# Patient Record
Sex: Male | Born: 1965 | Race: White | Hispanic: No | Smoking: Current every day smoker
Health system: Southern US, Community
[De-identification: ages and names within clinical notes are randomized; demographics above are authoritative.]

## PROBLEM LIST (undated history)

## (undated) DIAGNOSIS — B1920 Unspecified viral hepatitis C without hepatic coma: Secondary | ICD-10-CM

## (undated) DIAGNOSIS — F32A Depression, unspecified: Secondary | ICD-10-CM

## (undated) DIAGNOSIS — M549 Dorsalgia, unspecified: Secondary | ICD-10-CM

## (undated) DIAGNOSIS — F329 Major depressive disorder, single episode, unspecified: Secondary | ICD-10-CM

## (undated) DIAGNOSIS — G43909 Migraine, unspecified, not intractable, without status migrainosus: Secondary | ICD-10-CM

## (undated) DIAGNOSIS — C801 Malignant (primary) neoplasm, unspecified: Secondary | ICD-10-CM

## (undated) DIAGNOSIS — F419 Anxiety disorder, unspecified: Secondary | ICD-10-CM

## (undated) DIAGNOSIS — I639 Cerebral infarction, unspecified: Secondary | ICD-10-CM

## (undated) DIAGNOSIS — F1010 Alcohol abuse, uncomplicated: Secondary | ICD-10-CM

## (undated) DIAGNOSIS — I10 Essential (primary) hypertension: Secondary | ICD-10-CM

## (undated) DIAGNOSIS — K7460 Unspecified cirrhosis of liver: Secondary | ICD-10-CM

## (undated) HISTORY — PX: MANDIBLE SURGERY: SHX707

## (undated) HISTORY — PX: FOOT SURGERY: SHX648

---

## 2001-03-09 ENCOUNTER — Encounter: Payer: Self-pay | Admitting: Internal Medicine

## 2001-03-09 ENCOUNTER — Ambulatory Visit (HOSPITAL_COMMUNITY): Admission: RE | Admit: 2001-03-09 | Discharge: 2001-03-09 | Payer: Self-pay | Admitting: Internal Medicine

## 2001-05-12 ENCOUNTER — Emergency Department (HOSPITAL_COMMUNITY): Admission: EM | Admit: 2001-05-12 | Discharge: 2001-05-12 | Payer: Self-pay | Admitting: Emergency Medicine

## 2005-03-11 ENCOUNTER — Observation Stay (HOSPITAL_COMMUNITY): Admission: EM | Admit: 2005-03-11 | Discharge: 2005-03-12 | Payer: Self-pay | Admitting: Emergency Medicine

## 2005-04-02 ENCOUNTER — Ambulatory Visit (HOSPITAL_COMMUNITY): Admission: RE | Admit: 2005-04-02 | Discharge: 2005-04-02 | Payer: Self-pay | Admitting: *Deleted

## 2005-07-29 ENCOUNTER — Emergency Department (HOSPITAL_COMMUNITY): Admission: EM | Admit: 2005-07-29 | Discharge: 2005-07-29 | Payer: Self-pay | Admitting: Emergency Medicine

## 2005-08-12 ENCOUNTER — Emergency Department (HOSPITAL_COMMUNITY): Admission: EM | Admit: 2005-08-12 | Discharge: 2005-08-12 | Payer: Self-pay | Admitting: Emergency Medicine

## 2005-10-24 ENCOUNTER — Ambulatory Visit: Payer: Self-pay | Admitting: Cardiology

## 2005-10-24 ENCOUNTER — Encounter (INDEPENDENT_AMBULATORY_CARE_PROVIDER_SITE_OTHER): Payer: Self-pay | Admitting: Internal Medicine

## 2005-10-24 ENCOUNTER — Inpatient Hospital Stay (HOSPITAL_COMMUNITY): Admission: EM | Admit: 2005-10-24 | Discharge: 2005-10-26 | Payer: Self-pay | Admitting: Emergency Medicine

## 2005-10-24 LAB — CONVERTED CEMR LAB: TSH: 0.522 microintl units/mL

## 2005-10-26 ENCOUNTER — Encounter (INDEPENDENT_AMBULATORY_CARE_PROVIDER_SITE_OTHER): Payer: Self-pay | Admitting: Internal Medicine

## 2005-10-26 LAB — CONVERTED CEMR LAB
Cholesterol: 187 mg/dL
HDL: 40 mg/dL
LDL Cholesterol: 102 mg/dL
Triglycerides: 225 mg/dL

## 2005-10-30 ENCOUNTER — Ambulatory Visit: Payer: Self-pay | Admitting: Internal Medicine

## 2005-12-06 ENCOUNTER — Ambulatory Visit: Payer: Self-pay | Admitting: Internal Medicine

## 2005-12-30 ENCOUNTER — Ambulatory Visit: Payer: Self-pay | Admitting: Internal Medicine

## 2006-03-11 ENCOUNTER — Ambulatory Visit: Payer: Self-pay | Admitting: Internal Medicine

## 2006-03-26 ENCOUNTER — Ambulatory Visit: Payer: Self-pay | Admitting: Internal Medicine

## 2006-03-26 LAB — CONVERTED CEMR LAB
ALT: 22 units/L
AST: 21 units/L
Albumin: 4 g/dL
Basophils Absolute: 0 10*3/uL
CO2: 23 meq/L
Calcium: 9.6 mg/dL
Chloride: 108 meq/L
Creatinine, Ser: 1.1 mg/dL
Eosinophils Relative: 0 %
HCT: 42.3 %
Lipase: 129 units/L
Lymphocytes Relative: 22 %
Lymphs Abs: 1.6 10*3/uL
Neutro Abs: 5.4 10*3/uL
Neutrophils Relative %: 73 %
Platelets: 328 10*3/uL
Potassium: 3.8 meq/L
RBC count: 4.75 10*6/uL
RDW: 12.6 %
WBC: 7.4 10*3/uL

## 2006-05-10 ENCOUNTER — Emergency Department (HOSPITAL_COMMUNITY): Admission: EM | Admit: 2006-05-10 | Discharge: 2006-05-10 | Payer: Self-pay | Admitting: Emergency Medicine

## 2006-05-13 ENCOUNTER — Emergency Department (HOSPITAL_COMMUNITY): Admission: EM | Admit: 2006-05-13 | Discharge: 2006-05-13 | Payer: Self-pay | Admitting: Emergency Medicine

## 2006-05-14 ENCOUNTER — Encounter (HOSPITAL_COMMUNITY): Admission: RE | Admit: 2006-05-14 | Discharge: 2006-05-19 | Payer: Self-pay | Admitting: Internal Medicine

## 2006-05-14 ENCOUNTER — Ambulatory Visit (HOSPITAL_COMMUNITY): Payer: Self-pay | Admitting: Internal Medicine

## 2006-05-20 ENCOUNTER — Encounter (HOSPITAL_COMMUNITY): Admission: RE | Admit: 2006-05-20 | Discharge: 2006-06-07 | Payer: Self-pay | Admitting: Orthopaedic Surgery

## 2006-05-21 ENCOUNTER — Encounter (HOSPITAL_COMMUNITY): Admission: RE | Admit: 2006-05-21 | Discharge: 2006-06-06 | Payer: Self-pay | Admitting: Internal Medicine

## 2006-10-08 ENCOUNTER — Emergency Department (HOSPITAL_COMMUNITY): Admission: EM | Admit: 2006-10-08 | Discharge: 2006-10-08 | Payer: Self-pay | Admitting: Emergency Medicine

## 2007-01-11 ENCOUNTER — Emergency Department (HOSPITAL_COMMUNITY): Admission: EM | Admit: 2007-01-11 | Discharge: 2007-01-11 | Payer: Self-pay | Admitting: Emergency Medicine

## 2007-01-13 ENCOUNTER — Encounter: Payer: Self-pay | Admitting: Internal Medicine

## 2007-01-13 DIAGNOSIS — E785 Hyperlipidemia, unspecified: Secondary | ICD-10-CM | POA: Insufficient documentation

## 2007-01-13 DIAGNOSIS — F341 Dysthymic disorder: Secondary | ICD-10-CM

## 2007-01-13 DIAGNOSIS — M129 Arthropathy, unspecified: Secondary | ICD-10-CM | POA: Insufficient documentation

## 2007-01-13 DIAGNOSIS — G894 Chronic pain syndrome: Secondary | ICD-10-CM | POA: Insufficient documentation

## 2007-01-13 DIAGNOSIS — I6350 Cerebral infarction due to unspecified occlusion or stenosis of unspecified cerebral artery: Secondary | ICD-10-CM | POA: Insufficient documentation

## 2007-01-14 ENCOUNTER — Encounter: Payer: Self-pay | Admitting: Internal Medicine

## 2007-01-15 ENCOUNTER — Ambulatory Visit: Payer: Self-pay | Admitting: Internal Medicine

## 2007-02-16 ENCOUNTER — Ambulatory Visit: Payer: Self-pay | Admitting: Internal Medicine

## 2007-03-09 ENCOUNTER — Telehealth (INDEPENDENT_AMBULATORY_CARE_PROVIDER_SITE_OTHER): Payer: Self-pay | Admitting: Internal Medicine

## 2007-03-30 ENCOUNTER — Ambulatory Visit: Payer: Self-pay | Admitting: Internal Medicine

## 2007-03-31 ENCOUNTER — Encounter (INDEPENDENT_AMBULATORY_CARE_PROVIDER_SITE_OTHER): Payer: Self-pay | Admitting: Internal Medicine

## 2007-04-01 ENCOUNTER — Encounter (INDEPENDENT_AMBULATORY_CARE_PROVIDER_SITE_OTHER): Payer: Self-pay | Admitting: Internal Medicine

## 2007-04-04 ENCOUNTER — Emergency Department (HOSPITAL_COMMUNITY): Admission: EM | Admit: 2007-04-04 | Discharge: 2007-04-04 | Payer: Self-pay | Admitting: Emergency Medicine

## 2007-05-13 ENCOUNTER — Ambulatory Visit: Payer: Self-pay | Admitting: Internal Medicine

## 2007-05-13 DIAGNOSIS — G4700 Insomnia, unspecified: Secondary | ICD-10-CM | POA: Insufficient documentation

## 2007-05-21 ENCOUNTER — Ambulatory Visit (HOSPITAL_COMMUNITY): Admission: RE | Admit: 2007-05-21 | Discharge: 2007-05-21 | Payer: Self-pay | Admitting: Internal Medicine

## 2007-05-22 ENCOUNTER — Telehealth (INDEPENDENT_AMBULATORY_CARE_PROVIDER_SITE_OTHER): Payer: Self-pay | Admitting: Internal Medicine

## 2007-05-25 ENCOUNTER — Telehealth (INDEPENDENT_AMBULATORY_CARE_PROVIDER_SITE_OTHER): Payer: Self-pay | Admitting: *Deleted

## 2007-05-25 ENCOUNTER — Encounter (INDEPENDENT_AMBULATORY_CARE_PROVIDER_SITE_OTHER): Payer: Self-pay | Admitting: Internal Medicine

## 2007-05-26 ENCOUNTER — Telehealth (INDEPENDENT_AMBULATORY_CARE_PROVIDER_SITE_OTHER): Payer: Self-pay | Admitting: *Deleted

## 2007-05-27 ENCOUNTER — Encounter (INDEPENDENT_AMBULATORY_CARE_PROVIDER_SITE_OTHER): Payer: Self-pay | Admitting: Internal Medicine

## 2007-05-27 ENCOUNTER — Ambulatory Visit (HOSPITAL_COMMUNITY): Admission: RE | Admit: 2007-05-27 | Discharge: 2007-05-27 | Payer: Self-pay

## 2007-05-29 ENCOUNTER — Ambulatory Visit: Payer: Self-pay | Admitting: Internal Medicine

## 2007-05-29 DIAGNOSIS — M545 Low back pain: Secondary | ICD-10-CM

## 2007-05-29 DIAGNOSIS — R209 Unspecified disturbances of skin sensation: Secondary | ICD-10-CM

## 2007-07-03 ENCOUNTER — Ambulatory Visit: Payer: Self-pay | Admitting: Internal Medicine

## 2007-07-03 DIAGNOSIS — M76899 Other specified enthesopathies of unspecified lower limb, excluding foot: Secondary | ICD-10-CM | POA: Insufficient documentation

## 2007-07-03 DIAGNOSIS — R030 Elevated blood-pressure reading, without diagnosis of hypertension: Secondary | ICD-10-CM | POA: Insufficient documentation

## 2007-07-04 ENCOUNTER — Encounter (INDEPENDENT_AMBULATORY_CARE_PROVIDER_SITE_OTHER): Payer: Self-pay | Admitting: Internal Medicine

## 2007-07-05 LAB — CONVERTED CEMR LAB
Amphetamine Screen, Ur: NEGATIVE
Barbiturate Quant, Ur: NEGATIVE
Cocaine Metabolites: POSITIVE — AB
Creatinine,U: 185.5 mg/dL
Opiate Screen, Urine: NEGATIVE
Phencyclidine (PCP): NEGATIVE

## 2007-07-06 ENCOUNTER — Telehealth (INDEPENDENT_AMBULATORY_CARE_PROVIDER_SITE_OTHER): Payer: Self-pay | Admitting: *Deleted

## 2007-07-06 ENCOUNTER — Encounter (INDEPENDENT_AMBULATORY_CARE_PROVIDER_SITE_OTHER): Payer: Self-pay | Admitting: Internal Medicine

## 2007-07-09 ENCOUNTER — Encounter (INDEPENDENT_AMBULATORY_CARE_PROVIDER_SITE_OTHER): Payer: Self-pay | Admitting: Internal Medicine

## 2007-07-13 ENCOUNTER — Encounter (INDEPENDENT_AMBULATORY_CARE_PROVIDER_SITE_OTHER): Payer: Self-pay | Admitting: Internal Medicine

## 2007-08-16 ENCOUNTER — Emergency Department (HOSPITAL_COMMUNITY): Admission: EM | Admit: 2007-08-16 | Discharge: 2007-08-16 | Payer: Self-pay | Admitting: Emergency Medicine

## 2007-11-05 ENCOUNTER — Emergency Department (HOSPITAL_COMMUNITY): Admission: EM | Admit: 2007-11-05 | Discharge: 2007-11-06 | Payer: Self-pay | Admitting: Emergency Medicine

## 2008-06-12 ENCOUNTER — Emergency Department (HOSPITAL_COMMUNITY): Admission: EM | Admit: 2008-06-12 | Discharge: 2008-06-12 | Payer: Self-pay | Admitting: Emergency Medicine

## 2008-06-14 ENCOUNTER — Inpatient Hospital Stay (HOSPITAL_COMMUNITY): Admission: AD | Admit: 2008-06-14 | Discharge: 2008-06-18 | Payer: Self-pay

## 2008-06-14 ENCOUNTER — Encounter: Payer: Self-pay | Admitting: Emergency Medicine

## 2008-10-23 ENCOUNTER — Emergency Department (HOSPITAL_COMMUNITY): Admission: EM | Admit: 2008-10-23 | Discharge: 2008-10-24 | Payer: Self-pay | Admitting: Emergency Medicine

## 2009-01-06 ENCOUNTER — Emergency Department (HOSPITAL_COMMUNITY): Admission: EM | Admit: 2009-01-06 | Discharge: 2009-01-06 | Payer: Self-pay | Admitting: Emergency Medicine

## 2009-01-13 ENCOUNTER — Ambulatory Visit (HOSPITAL_COMMUNITY): Admission: RE | Admit: 2009-01-13 | Discharge: 2009-01-13 | Payer: Self-pay | Admitting: Family Medicine

## 2009-09-17 ENCOUNTER — Emergency Department (HOSPITAL_COMMUNITY): Admission: EM | Admit: 2009-09-17 | Discharge: 2009-09-17 | Payer: Self-pay | Admitting: Emergency Medicine

## 2009-11-03 ENCOUNTER — Emergency Department (HOSPITAL_COMMUNITY): Admission: EM | Admit: 2009-11-03 | Discharge: 2009-11-03 | Payer: Self-pay | Admitting: Emergency Medicine

## 2009-12-04 ENCOUNTER — Emergency Department (HOSPITAL_COMMUNITY): Admission: EM | Admit: 2009-12-04 | Discharge: 2009-12-04 | Payer: Self-pay | Admitting: Emergency Medicine

## 2010-03-16 ENCOUNTER — Ambulatory Visit: Payer: Self-pay | Admitting: Psychiatry

## 2010-03-16 ENCOUNTER — Emergency Department (HOSPITAL_COMMUNITY): Admission: EM | Admit: 2010-03-16 | Discharge: 2010-03-16 | Payer: Self-pay | Admitting: Emergency Medicine

## 2010-03-16 ENCOUNTER — Inpatient Hospital Stay (HOSPITAL_COMMUNITY): Admission: AD | Admit: 2010-03-16 | Discharge: 2010-03-19 | Payer: Self-pay | Admitting: Psychiatry

## 2010-03-17 ENCOUNTER — Emergency Department (HOSPITAL_COMMUNITY): Admission: EM | Admit: 2010-03-17 | Discharge: 2010-03-17 | Payer: Self-pay | Admitting: Emergency Medicine

## 2010-04-30 ENCOUNTER — Inpatient Hospital Stay (HOSPITAL_COMMUNITY): Admission: EM | Admit: 2010-04-30 | Discharge: 2010-05-02 | Payer: Self-pay | Admitting: Emergency Medicine

## 2010-04-30 ENCOUNTER — Ambulatory Visit: Payer: Self-pay | Admitting: Cardiology

## 2010-05-01 ENCOUNTER — Encounter (INDEPENDENT_AMBULATORY_CARE_PROVIDER_SITE_OTHER): Payer: Self-pay | Admitting: Internal Medicine

## 2010-05-07 ENCOUNTER — Emergency Department (HOSPITAL_COMMUNITY): Admission: EM | Admit: 2010-05-07 | Discharge: 2010-05-07 | Payer: Self-pay | Admitting: Emergency Medicine

## 2010-05-11 ENCOUNTER — Emergency Department (HOSPITAL_COMMUNITY): Admission: EM | Admit: 2010-05-11 | Discharge: 2010-05-11 | Payer: Self-pay | Admitting: Emergency Medicine

## 2010-05-11 ENCOUNTER — Emergency Department (HOSPITAL_COMMUNITY): Admission: EM | Admit: 2010-05-11 | Discharge: 2010-05-12 | Payer: Self-pay | Admitting: Emergency Medicine

## 2010-11-22 LAB — DIFFERENTIAL
Basophils Absolute: 0.1 10*3/uL (ref 0.0–0.1)
Basophils Relative: 1 % (ref 0–1)
Eosinophils Absolute: 0.1 10*3/uL (ref 0.0–0.7)
Eosinophils Relative: 1 % (ref 0–5)
Monocytes Absolute: 0.6 10*3/uL (ref 0.1–1.0)

## 2010-11-22 LAB — URINALYSIS, ROUTINE W REFLEX MICROSCOPIC
Bilirubin Urine: NEGATIVE
Glucose, UA: 500 mg/dL — AB
Ketones, ur: NEGATIVE mg/dL
Leukocytes, UA: NEGATIVE
Nitrite: NEGATIVE
Specific Gravity, Urine: 1.005 (ref 1.005–1.030)
pH: 6 (ref 5.0–8.0)

## 2010-11-22 LAB — COMPREHENSIVE METABOLIC PANEL
AST: 23 U/L (ref 0–37)
Albumin: 4.1 g/dL (ref 3.5–5.2)
Alkaline Phosphatase: 94 U/L (ref 39–117)
CO2: 23 mEq/L (ref 19–32)
Chloride: 103 mEq/L (ref 96–112)
GFR calc Af Amer: >60 mL/min (ref >60)
GFR calc non Af Amer: >60 mL/min (ref >60)
Potassium: 4.1 mEq/L (ref 3.5–5.1)
Total Bilirubin: 0.7 mg/dL (ref 0.3–1.2)

## 2010-11-22 LAB — CBC
Hemoglobin: 15.2 g/dL (ref 13.0–17.0)
MCV: 87.1 fL (ref 78.0–100.0)
Platelets: 290 10*3/uL (ref 150–400)
RBC: 5.21 MIL/uL (ref 4.22–5.81)
WBC: 8.8 10*3/uL (ref 4.0–10.5)

## 2010-11-23 LAB — CARDIAC PANEL(CRET KIN+CKTOT+MB+TROPI)
CK, MB: 2.5 ng/mL (ref 0.3–4.0)
Relative Index: 0.9 (ref 0.0–2.5)
Troponin I: 0.01 ng/mL (ref 0.00–0.06)
Troponin I: 0.02 ng/mL (ref 0.00–0.06)

## 2010-11-23 LAB — DIFFERENTIAL
Basophils Absolute: 0.1 10*3/uL (ref 0.0–0.1)
Basophils Relative: 1 % (ref 0–1)
Eosinophils Absolute: 0.2 10*3/uL (ref 0.0–0.7)
Eosinophils Absolute: 0.3 10*3/uL (ref 0.0–0.7)
Lymphocytes Relative: 41 % (ref 12–46)
Lymphs Abs: 4.5 10*3/uL — ABNORMAL HIGH (ref 0.7–4.0)
Monocytes Absolute: 0.8 10*3/uL (ref 0.1–1.0)
Monocytes Relative: 8 % (ref 3–12)
Neutro Abs: 5.3 10*3/uL (ref 1.7–7.7)
Neutro Abs: 7.8 10*3/uL — ABNORMAL HIGH (ref 1.7–7.7)
Neutrophils Relative %: 48 % (ref 43–77)
Neutrophils Relative %: 61 % (ref 43–77)

## 2010-11-23 LAB — GLUCOSE, CAPILLARY
Glucose-Capillary: 114 mg/dL — ABNORMAL HIGH (ref 70–99)
Glucose-Capillary: 125 mg/dL — ABNORMAL HIGH (ref 70–99)
Glucose-Capillary: 145 mg/dL — ABNORMAL HIGH (ref 70–99)
Glucose-Capillary: 255 mg/dL — ABNORMAL HIGH (ref 70–99)
Glucose-Capillary: 91 mg/dL (ref 70–99)

## 2010-11-23 LAB — CBC
HCT: 41.7 % (ref 39.0–52.0)
HCT: 44.1 % (ref 39.0–52.0)
HCT: 46.4 % (ref 39.0–52.0)
Hemoglobin: 15 g/dL (ref 13.0–17.0)
MCH: 29.4 pg (ref 26.0–34.0)
MCHC: 33.5 g/dL (ref 30.0–36.0)
MCHC: 33.8 g/dL (ref 30.0–36.0)
MCV: 88.3 fL (ref 78.0–100.0)
Platelets: 293 10*3/uL (ref 150–400)
RBC: 5.06 MIL/uL (ref 4.22–5.81)
RDW: 12.7 % (ref 11.5–15.5)
RDW: 13.1 % (ref 11.5–15.5)
RDW: 13.2 % (ref 11.5–15.5)
WBC: 9.7 10*3/uL (ref 4.0–10.5)

## 2010-11-23 LAB — BASIC METABOLIC PANEL
BUN: 10 mg/dL (ref 6–23)
BUN: 9 mg/dL (ref 6–23)
Calcium: 9.3 mg/dL (ref 8.4–10.5)
Creatinine, Ser: 0.82 mg/dL (ref 0.4–1.5)
GFR calc non Af Amer: >60 mL/min (ref >60)
GFR calc non Af Amer: >60 mL/min (ref >60)
Glucose, Bld: 96 mg/dL (ref 70–99)

## 2010-11-23 LAB — RAPID URINE DRUG SCREEN, HOSP PERFORMED
Benzodiazepines: POSITIVE — AB
Cocaine: NOT DETECTED
Tetrahydrocannabinol: POSITIVE — AB

## 2010-11-23 LAB — LIPID PANEL
HDL: 26 mg/dL — ABNORMAL LOW (ref >39)
Total CHOL/HDL Ratio: 6.9 RATIO
VLDL: UNDETERMINED mg/dL (ref 0–40)

## 2010-11-23 LAB — COMPREHENSIVE METABOLIC PANEL
ALT: 37 U/L (ref 0–53)
Alkaline Phosphatase: 87 U/L (ref 39–117)
Chloride: 109 mEq/L (ref 96–112)
Glucose, Bld: 88 mg/dL (ref 70–99)
Potassium: 4 mEq/L (ref 3.5–5.1)
Sodium: 139 mEq/L (ref 135–145)
Total Bilirubin: 0.5 mg/dL (ref 0.3–1.2)
Total Protein: 7.3 g/dL (ref 6.0–8.3)

## 2010-11-23 LAB — URINALYSIS, ROUTINE W REFLEX MICROSCOPIC
Bilirubin Urine: NEGATIVE
Glucose, UA: NEGATIVE mg/dL
Hgb urine dipstick: NEGATIVE
Ketones, ur: NEGATIVE mg/dL
Ketones, ur: NEGATIVE mg/dL
Nitrite: NEGATIVE
Protein, ur: NEGATIVE mg/dL
Protein, ur: NEGATIVE mg/dL
Specific Gravity, Urine: >1.03 — ABNORMAL HIGH (ref 1.005–1.030)
Urobilinogen, UA: 0.2 mg/dL (ref 0.0–1.0)

## 2010-11-23 LAB — HEMOGLOBIN A1C
Hgb A1c MFr Bld: 7.3 % — ABNORMAL HIGH (ref <5.7)
Mean Plasma Glucose: 163 mg/dL — ABNORMAL HIGH (ref <117)

## 2010-11-25 LAB — GLUCOSE, CAPILLARY
Glucose-Capillary: 117 mg/dL — ABNORMAL HIGH (ref 70–99)
Glucose-Capillary: 121 mg/dL — ABNORMAL HIGH (ref 70–99)
Glucose-Capillary: 156 mg/dL — ABNORMAL HIGH (ref 70–99)
Glucose-Capillary: 211 mg/dL — ABNORMAL HIGH (ref 70–99)

## 2010-11-25 LAB — URINALYSIS, ROUTINE W REFLEX MICROSCOPIC
Leukocytes, UA: NEGATIVE
Nitrite: NEGATIVE
Specific Gravity, Urine: 1.02 (ref 1.005–1.030)
Urobilinogen, UA: 0.2 mg/dL (ref 0.0–1.0)
pH: 5 (ref 5.0–8.0)

## 2010-11-25 LAB — CBC
Hemoglobin: 16.3 g/dL (ref 13.0–17.0)
MCH: 29.8 pg (ref 26.0–34.0)
MCHC: 34.3 g/dL (ref 30.0–36.0)
RDW: 13 % (ref 11.5–15.5)

## 2010-11-25 LAB — URINE MICROSCOPIC-ADD ON

## 2010-11-25 LAB — BASIC METABOLIC PANEL
BUN: 8 mg/dL (ref 6–23)
CO2: 19 mEq/L (ref 19–32)
Calcium: 9.2 mg/dL (ref 8.4–10.5)
GFR calc non Af Amer: >60 mL/min (ref >60)
Glucose, Bld: 237 mg/dL — ABNORMAL HIGH (ref 70–99)

## 2010-11-25 LAB — DIFFERENTIAL
Basophils Absolute: 0.1 10*3/uL (ref 0.0–0.1)
Basophils Relative: 1 % (ref 0–1)
Eosinophils Absolute: 0.1 10*3/uL (ref 0.0–0.7)
Monocytes Absolute: 0.7 10*3/uL (ref 0.1–1.0)
Monocytes Relative: 7 % (ref 3–12)
Neutro Abs: 5.3 10*3/uL (ref 1.7–7.7)
Neutrophils Relative %: 53 % (ref 43–77)

## 2010-11-25 LAB — RAPID URINE DRUG SCREEN, HOSP PERFORMED
Amphetamines: NOT DETECTED
Opiates: POSITIVE — AB
Tetrahydrocannabinol: POSITIVE — AB

## 2010-11-28 LAB — BASIC METABOLIC PANEL
CO2: 26 mEq/L (ref 19–32)
Chloride: 101 mEq/L (ref 96–112)
GFR calc Af Amer: >60 mL/min (ref >60)
Sodium: 136 mEq/L (ref 135–145)

## 2010-11-28 LAB — CBC
HCT: 48.6 % (ref 39.0–52.0)
Hemoglobin: 16.5 g/dL (ref 13.0–17.0)
MCHC: 34 g/dL (ref 30.0–36.0)
MCV: 87.5 fL (ref 78.0–100.0)
RBC: 5.56 MIL/uL (ref 4.22–5.81)

## 2010-11-28 LAB — DIFFERENTIAL
Basophils Relative: 1 % (ref 0–1)
Eosinophils Absolute: 0.1 10*3/uL (ref 0.0–0.7)
Monocytes Absolute: 0.5 10*3/uL (ref 0.1–1.0)
Monocytes Relative: 4 % (ref 3–12)

## 2010-12-19 LAB — PROTIME-INR
INR: 1 (ref 0.00–1.49)
Prothrombin Time: 13.1 seconds (ref 11.6–15.2)

## 2010-12-19 LAB — DIFFERENTIAL
Basophils Absolute: 0 10*3/uL (ref 0.0–0.1)
Basophils Relative: 0 % (ref 0–1)
Monocytes Relative: 6 % (ref 3–12)
Neutro Abs: 10.4 10*3/uL — ABNORMAL HIGH (ref 1.7–7.7)
Neutrophils Relative %: 79 % — ABNORMAL HIGH (ref 43–77)

## 2010-12-19 LAB — COMPREHENSIVE METABOLIC PANEL
Alkaline Phosphatase: 100 U/L (ref 39–117)
BUN: 7 mg/dL (ref 6–23)
CO2: 22 mEq/L (ref 19–32)
GFR calc non Af Amer: >60 mL/min (ref >60)
Glucose, Bld: 147 mg/dL — ABNORMAL HIGH (ref 70–99)
Potassium: 4 mEq/L (ref 3.5–5.1)
Total Protein: 7.4 g/dL (ref 6.0–8.3)

## 2010-12-19 LAB — CBC
MCHC: 34.4 g/dL (ref 30.0–36.0)
RDW: 13.6 % (ref 11.5–15.5)

## 2010-12-19 LAB — POCT CARDIAC MARKERS: Troponin i, poc: <0.05 ng/mL (ref 0.00–0.09)

## 2010-12-19 LAB — APTT: aPTT: 28 seconds (ref 24–37)

## 2010-12-19 LAB — CK TOTAL AND CKMB (NOT AT ARMC)
CK, MB: 1.1 ng/mL (ref 0.3–4.0)
Total CK: 139 U/L (ref 7–232)

## 2010-12-25 LAB — URINALYSIS, ROUTINE W REFLEX MICROSCOPIC
Glucose, UA: 250 mg/dL — AB
Leukocytes, UA: NEGATIVE
Nitrite: NEGATIVE
Specific Gravity, Urine: 1.015 (ref 1.005–1.030)
pH: 6 (ref 5.0–8.0)

## 2010-12-25 LAB — COMPREHENSIVE METABOLIC PANEL
ALT: 57 U/L — ABNORMAL HIGH (ref 0–53)
AST: 39 U/L — ABNORMAL HIGH (ref 0–37)
CO2: 24 mEq/L (ref 19–32)
Calcium: 9.6 mg/dL (ref 8.4–10.5)
Creatinine, Ser: 0.91 mg/dL (ref 0.4–1.5)
GFR calc Af Amer: >60 mL/min (ref >60)
GFR calc non Af Amer: >60 mL/min (ref >60)
Sodium: 137 mEq/L (ref 135–145)
Total Protein: 8 g/dL (ref 6.0–8.3)

## 2010-12-25 LAB — URINE MICROSCOPIC-ADD ON

## 2010-12-25 LAB — CBC
MCHC: 34 g/dL (ref 30.0–36.0)
MCV: 84.7 fL (ref 78.0–100.0)
RDW: 13.3 % (ref 11.5–15.5)

## 2010-12-25 LAB — DIFFERENTIAL
Eosinophils Absolute: 0.1 10*3/uL (ref 0.0–0.7)
Eosinophils Relative: 1 % (ref 0–5)
Lymphocytes Relative: 30 % (ref 12–46)
Lymphs Abs: 3.6 10*3/uL (ref 0.7–4.0)
Monocytes Relative: 8 % (ref 3–12)
Neutrophils Relative %: 62 % (ref 43–77)

## 2010-12-25 LAB — BLOOD GAS, ARTERIAL
FIO2: 0.21 %
O2 Saturation: 87.7 %
pCO2 arterial: 37.8 mmHg (ref 35.0–45.0)
pH, Arterial: 7.384 (ref 7.350–7.450)

## 2010-12-25 LAB — LIPASE, BLOOD: Lipase: 32 U/L (ref 11–59)

## 2011-01-17 ENCOUNTER — Emergency Department (HOSPITAL_COMMUNITY)
Admission: EM | Admit: 2011-01-17 | Discharge: 2011-01-17 | Disposition: A | Discharge status: Home / Self Care | Payer: Self-pay | Attending: Emergency Medicine | Admitting: Emergency Medicine

## 2011-01-17 ENCOUNTER — Emergency Department (HOSPITAL_COMMUNITY): Payer: Self-pay

## 2011-01-17 DIAGNOSIS — S93409A Sprain of unspecified ligament of unspecified ankle, initial encounter: Secondary | ICD-10-CM | POA: Insufficient documentation

## 2011-01-17 DIAGNOSIS — W010XXA Fall on same level from slipping, tripping and stumbling without subsequent striking against object, initial encounter: Secondary | ICD-10-CM | POA: Insufficient documentation

## 2011-01-17 DIAGNOSIS — Y92009 Unspecified place in unspecified non-institutional (private) residence as the place of occurrence of the external cause: Secondary | ICD-10-CM | POA: Insufficient documentation
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
## 2011-01-22 NOTE — Op Note (Signed)
Danny [name], Danny [name] NO.:  [id number]   MEDICAL RECORD NO.:  [id number]          PATIENT TYPE:  INP   LOCATION:  6736                         FACILITY:  [facility]   PHYSICIAN:  Suzanna Obey, M.D.       DATE OF BIRTH:  1966-04-03   DATE OF PROCEDURE:  06/15/2008  DATE OF DISCHARGE:                               OPERATIVE REPORT   PREOPERATIVE DIAGNOSIS:  Right facial abscess.   POSTOPERATIVE DIAGNOSIS:  Right facial abscess.   SURGICAL PROCEDURE:  Incision and drainage of right facial abscess.   ANESTHESIA:  General.   ESTIMATED BLOOD LOSS:  Approximately 10 mL.   INDICATIONS:  This is a 45 year old with a 1-week history of swelling in  the right side of his face.  It has progressed here in the last few  days, and now he has a CT scan confirmation of an abscess in the right  facial area/parotid that is not responding to antibiotics.  He was  informed of the risks and benefits of the procedure and options were  discussed.  All questions were answered and consent was obtained.   OPERATION:  The patient was taken to the operating room and placed in  supine position, after LMA anesthesia was prepped and draped in the  usual sterile manner, the incision was made just off the right corner of  the most prominent portion of the abscess.  This was located just about  the angle of the mandible.  Just the skin was cut, then with a hemostat  blunt dissection was used to open and enter the abscess cavity, which  had purulent material expressed.  Cultures were taken.  The wound was  irrigated with saline and then a Penrose quater inch was placed and  secured with 3-0 nylon.  The patient was awakened and brought to the  recovery room in stable condition.  Counts were correct.           ______________________________  Suzanna Obey, M.D.     JB/MEDQ  D:  06/15/2008  T:  06/15/2008  Job:  [id number]

## 2011-01-22 NOTE — Discharge Summary (Signed)
Danny [name], YON NO.:  [id number]   MEDICAL RECORD NO.:  [id number]          PATIENT TYPE:  INP   LOCATION:  6736                         FACILITY:  [facility]   PHYSICIAN:  Isidor Holts, M.D.  DATE OF BIRTH:  Jun 30, 1966   DATE OF ADMISSION:  06/14/2008  DATE OF DISCHARGE:  06/18/2008                               DISCHARGE SUMMARY   PMD:  Unassigned.   DISCHARGE DIAGNOSES:  1. Right parotitis/facial abscess, status post I & D June 15, 2008      by Dr. Jearld Fenton, ENT.  2. Newly-diagnosed type 2 diabetes.  3. Smoker.  4. Coronary artery disease.  5. History of chronic neck/back pain.   DISCHARGE MEDICATIONS:  1. Augmentin 875 mg p.o. b.i.d. for 1 week.  2. Glucophage 500 mg p.o. b.i.d.  3. Lantus 20 units subcutaneously q.h.s.  4. Sliding-scale insulin coverage with NovoLog as follows:  CBG 70-100      no insulin, CBG 101-150 one unit, CBG 151-200 two units, CBG 201-      250 three units, CBG 251-300 five units, CBG 301-350 seven units,      CBG 351-400 nine units.  5. Motrin 400 mg p.o. p.r.n. t.i.d. with food.  6. Vicodin (5/325) 1 p.r.n. every 4-6 hours.   PROCEDURES.:  1. Neck CT scan dated June 14, 2008.  This showed a 1.8-cm rim-      enhancing low-density lesion in the inferior right parotid gland.  2. Incision and drainage of right facial abscess under general      anesthesia June 15, 2008 by Dr. Suzanna Obey, uncomplicated      procedure.   CONSULTATIONS:  Dr. Suzanna Obey, ENT surgeon.   ADMISSION HISTORY:  As in the H and P notes of June 14, 2008 dictated  by Dr. Hillery Aldo.  However, in brief, this is a 45 year old male,  with a known history of noncritical coronary artery disease, status post  cardiac catheterization March 10, 2005, previous history of cellulitis,  history of chronic neck and back pain, smoking history, presenting with  a 5-day history of increasing painful  right neck swelling associated  with a parotitis.  He was  initially commenced on Cephalexin and  scheduled an appointment with [facility] ENT.  The patient was unable to  keep that appointment and re-presented to [facility], where he  was evaluated and subsequently transferred to [facility].   CLINICAL COURSE.:  1. Right parotitis/right facial abscess.  For details of presentation,      refer to the admission history above.  The patient was commenced on      a combination of intravenous Vancomycin and Zosyn.  ENT      consultation was kindly provided by Dr. Jearld Fenton, who performed an I &      D on June 15, 2008.  The abscess culture grew gram-positive cocci      in pairs, however, at the time of this dictation identification has      not been made.  Be that as it may, the patient responded to the  above-mentioned management measures, and by June 18, 2008 local      inflammatory phenomena had practically subsided and the patient was      feeling quite well.  He was seen by Dr. Lazarus Salines in the a.m. of      June 18, 2008, transitioned to oral antibiotic treatment with      Augmentin and scheduled an appointment to follow up with Dr. Jearld Fenton      on June 20, 2008 or June 21, 2008.   1. Coronary artery disease.  The patient was asymptomatic from this      viewpoint.   1. Chronic neck/back pain.  This did not prove problematic.   1. Newly-diagnosed type 2 diabetes mellitus.  The patient during this      hospitalization, was found to be hyperglycemic.  He has no known      previous history of diabetes mellitus.  HbA1c was found to be      significantly elevated at 11.7.  Lipid profile showed the following      findings:  Total cholesterol 142, triglyceride 181, HDL 22, LDL 84.      The patient was managed with a combination of carbohydrate-modified      diet, sliding-scale insulin coverage, scheduled Lantus insulin and      oral hypoglycemics, with satisfactory clinical response.  He has      undergone diabetic  teaching and a home health RN has been arranged      until the patient establishes with a PMD in the coming week.   1. Smoking history.  The patient was counseled appropriately, and      managed with Nicoderm CQ patch.   DISPOSITION:  The patient was on June 18, 2008, considered  sufficiently clinically recovered and stable to be discharged.  He was  therefore, discharged accordingly.   DIET:  Heart-healthy/carbohydrate modified.   ACTIVITY:  As tolerated.   WOUND CARE:  Bacitracin ointment to wound twice daily/Dry dressings.   FOLLOW-UP INSTRUCTIONS:  The patient is to follow up with Dr. Suzanna Obey, ENT, in 2-3 days, telephone number [phone].  He has also  been recommended to establish primary care with [facility] in the  coming week and has been supplied appropriate information.   SPECIAL INSTRUCTIONS:  A home health RN has been arranged.      Isidor Holts, M.D.  Electronically Signed     CO/MEDQ  D:  06/18/2008  T:  06/18/2008  Job:  [id number]   cc:   Suzanna Obey, M.D.  [facility]

## 2011-01-25 NOTE — H&P (Signed)
NAMESHISHIR, KRANTZ NO.:  [id number]   MEDICAL RECORD NO.:  [id number]          PATIENT TYPE:  INP   LOCATION:  A204                          FACILITY:  [facility]   PHYSICIAN:  Madelin Rear. Sherwood Gambler, MD  DATE OF BIRTH:  06-Jan-1966   DATE OF ADMISSION:  03/11/2005  DATE OF DISCHARGE:  LH                                HISTORY & PHYSICAL   CHIEF COMPLAINT:  Chest pain.   HISTORY OF PRESENT ILLNESS:  The patient recently ran out of his Tylox  prescription.  He states that five days ago he stopped taking Tylox  suddenly.  He described no real withdrawal symptoms, however, during  exertion on Friday or Saturday while mowing the lawn, he developed chest  pain.  It was pleuritic and definitely movement induced.  He denied any  associated cardiopulmonary symptoms.  The chest pain persisted and made him  quite uncomfortable.  He presented to the emergency department.   PAST MEDICAL HISTORY:  He has had long-term narcotic and benzodiazepine  usage for insomnia and chronic back pain.   SOCIAL HISTORY:  Positive for cigarette smoking.  Negative for alcohol other  drug use.   FAMILY HISTORY:  His father had a cerebrovascular accident.  His mother is  healthy.  He has paternal grandfather with coronary artery disease and a  paternal grandmother with cancer of unspecified site, maternal grandfather  with myocardial infarction and maternal grandmother with cancer.  His spouse  is healthy.  He has one sister that is healthy and one child that is  healthy.   REVIEW OF SYSTEMS:  Under HPI.  He did admit to a cough productive of scant  sputum associated with his chest pain.   PHYSICAL EXAMINATION:  GENERAL APPEARANCE:  He is actively kicking his legs  repetitively.  He is also diaphoretic.  HEENT:  His head and show no JVD or adenopathy.  The neck is supple.  CHEST:  Positive reproducible chest wall tenderness anteriorly.  No  crepitus.  Scattered rhonchi noted.  CARDIAC:  Regular  rhythm without gallop or rub.  ABDOMEN:  Soft.  No organomegaly or masses.  EXTREMITIES:  Without cyanosis, clubbing or edema.  NEUROLOGIC:  Examination nonfocal.  Specifically, he did not have any  asterixis or other tremors.   LABORATORY DATA:  Blood work appeared benign.  Initial cardiac enzymes  negative.  D-dimer normal.  Chest x-ray with no acute disease.   IMPRESSION:  Chest pain, likely costochondritis/chest wall pain.   PLAN:  1.  He will be admitted for rule out.  However, due to cardiac risk factors      of age and smoking, will use this opportunity to consult cardiology to      arrange for anticipated outpatient stress Cardiolite.  2.  Sudden cessation of opiates with what appears to be active withdrawal      symptomatology.  Will replace his narcotics with Demerol parenterally,      as well as oral Tylox.  Will consult behavioral health about possibly      detoxifying him from both benzodiazepines, as well as  opiates.       LJF/MEDQ  D:  03/11/2005  T:  03/11/2005  Job:  [id number]

## 2011-01-25 NOTE — Procedures (Signed)
NAMEGIOVAN, PINSKY NO.:  [id number]   MEDICAL RECORD NO.:  [id number]          PATIENT TYPE:  INP   LOCATION:  IC09                          FACILITY:  [facility]   PHYSICIAN:  Frederika Bing, M.D. [facility] OF BIRTH:  November 01, 1965   DATE OF PROCEDURE:  10/24/2005  DATE OF DISCHARGE:                                  ECHOCARDIOGRAM   REFERRING:  Dr. Rito Ehrlich.   CLINICAL DATA:  A 45 year old gentleman with CVA.   M-MODE:  Aorta 2.7, left atrium 3.9, septum 1.1, posterior wall 1.3, LV  diastole 4.5, LV systole 3.4.   1.  Technically adequate echocardiographic study.  2.  Normal left atrium, right atrium and right ventricle.  3.  Normal aortic, mitral, tricuspid and pulmonic valves.  4.  Normal Doppler study with physiologic tricuspid regurgitation.  5.  Normal left ventricular size; borderline LVH; normal LV systolic      function.  6.  Normal IVC.  7.  No pericardial effusion.  8.  Consideration should be given to obtaining a transesophageal      echocardiogram, which would be more likely to identify a cardiac source      of embolism.      Hayfield Bing, M.D. [facility]  Electronically Signed     RR/MEDQ  D:  10/25/2005  T:  10/25/2005  Job:  [id number]

## 2011-01-25 NOTE — Discharge Summary (Signed)
NAME:  Danny [name], Danny [name] NO.:  [id number]   MEDICAL RECORD NO.:  [id number]          PATIENT TYPE:  INP   LOCATION:  A325                          FACILITY:  [facility]   PHYSICIAN:  Calvert Cantor, M.D.     DATE OF BIRTH:  12-Apr-1966   DATE OF ADMISSION:  10/23/2005  DATE OF DISCHARGE:  LH                                 DISCHARGE SUMMARY   The patient does not have a primary care physician and he has been given the  option to go to the physicians who are currently accepting patients which  are Delbert Harness, M.D., Dorthula Rue. Early Chars, M.D .   DISCHARGE DIAGNOSES:  1.  Cerebrovascular accident versus complicated migraine.  2.  History of anxiety and insomnia.  3.  History of chronic benzodiazepine use.  4.  Emphysema per chest x-ray and CT scan.   DISCHARGE MEDICATIONS:  1.  Aspirin 325 mg daily.  2.  Zocor 20 mg daily.  3.  Lunesta 2 mg q.h.s. p.r.n. insomnia.  4.  One vitamin daily.   The patient has been told to discontinue his Xanax.   HOSPITAL COURSE:  This is a 45 year old white male who is admitted with  numbness of his left arm, left leg, followed by a 4/5 weakness in the left  arm and left leg.  On admission, the patient was thought to be having a CVA  and therefore, he was given TPA.  His CT scan in the ER without contrast was  negative.  He also had CT of his neck which did not show any acute changes.  An MRI done on the following day did not show any signs of an acute stroke.  There was no atrophy.  An MRA also was unremarkable.  Carotid ultrasound was  also negative for significant stenosis.   The patient continued to have numbness for the next day.  It finally  improved on day three of admission along with his strength improving as  well.  On admission, the patient had complained of a headache which was  mostly frontal.  He had had this headache for about three or four days.  It  had not been associated with any nausea or vomiting or visual changes;  therefore, it was thought that he possibly could have had a migraine with  neurological complications.  The patient has a significant history of  strokes in his family, namely his father who is paralyzed.   Recent blood work:  WBC 9.2, hemoglobin 14.1, hematocrit 41, platelets 347.  ESR was 0.  PT 13.6, INR 1.0, PTT 30.  Sodium 136, potassium 3.7, chloride  107, bicarb 26, glucose 126, BUN 9, creatinine 0.8.  LFTs were all within  normal limits.  Calcium 8.9.  A1c 5.6.  TSH 0.522.  Urine was positive for  benzodiazepines.  Alcohol level was 144.  UA was negative for infection.  ANA was negative.   The patient was asking for a Xanax prescription.  He does not have any  significant anxiety and mainly uses it at bedtime to help him fall asleep;  therefore, I  suggested a sleeping pill instead of Xanax which he is  agreeable to.   DIET:  I have told him to be on a low fat diet.   ACTIVITY:  No restrictions.   CONDITION:  Stable.      Calvert Cantor, M.D.  Electronically Signed     SR/MEDQ  D:  10/26/2005  T:  10/26/2005  Job:  [id number]
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
## 2011-01-25 NOTE — Consult Note (Signed)
NAME:  Danny [name], Danny [name] NO.:  [id number]   MEDICAL RECORD NO.:  [id number]          PATIENT TYPE:  EMS   LOCATION:  ED                            FACILITY:  [facility]   PHYSICIAN:  J. Darreld Mclean, M.D. DATE OF BIRTH:  1966-01-08   DATE OF CONSULTATION:  05/13/2006  DATE OF DISCHARGE:  05/13/2006                                   CONSULTATION   HISTORY OF PRESENT ILLNESS:  The patient stuck a spike in the back of his  left lower calf above the insertion of the Achilles two days ago.  See in  the ER.  Had a suture.  Comes back today claiming increased pain.  Dr.  Josefine Class had an MRI of his calf since the patient was complaining of pain.  The patient is afebrile.  His vital signs are normal.  He was put on Keflex.  MRI shows possible osculated area of purulence, swelling, blood.  There is a  question of compartment syndrome.   PHYSICAL EXAMINATION:  GENERAL:  Basically looks very good.  There is no  evidence of compartment syndrome.  His muscles are not tight.  There is no  drainage.  There is no seepage.  There is no redness.  Patient complains of  significant pain.  I saw the patient in the past, and he does have a very  low pain threshold.  There is no drainage from the wound.  I removed the  staples.  I elected not to try to aspirate the area but try to treat this  with a posterior splint, elevation, crutches, antibiotics, analgesics for  pain.  I will see him in the office tomorrow for reexamination of the  __________ .  If Dr. Josefine Class wants to admit him she can.  I will see him  tomorrow her in the hospital or she will treat him as an outpatient with IV  antibiotics.           ______________________________  Shela Commons. Darreld Mclean, M.D.     JWK/MEDQ  D:  05/13/2006  T:  05/14/2006  Job:  [id number]

## 2011-01-25 NOTE — Procedures (Signed)
NAMEGRASON, BRAILSFORD NO.:  [id number]   MEDICAL RECORD NO.:  [id number]          PATIENT TYPE:  EMS   LOCATION:  ED                            FACILITY:  [facility]   PHYSICIAN:  Edward L. Juanetta Gosling, M.D.DATE OF BIRTH:  [birth year]   DATE OF PROCEDURE:  DATE OF DISCHARGE:                                EKG INTERPRETATION   The rhythm is a sinus tachycardia, but there is significant variation in the  rate.  There is atrial enlargement which appears to be biatrial.  Abnormal  electrocardiogram.      Ramon Dredge L. Juanetta Gosling, M.D.  Electronically Signed     ELH/MEDQ  D:  08/13/2005  T:  08/14/2005  Job:  [id number]

## 2011-01-25 NOTE — Cardiovascular Report (Signed)
NAMEJALON, Danny [name] NO.:  [id number]   MEDICAL RECORD NO.:  [id number]          PATIENT TYPE:  OIB   LOCATION:  2855                         FACILITY:  [facility]   PHYSICIAN:  Darlin Priestly, MD  DATE OF BIRTH:  10/24/1965   DATE OF PROCEDURE:  04/02/2005  DATE OF DISCHARGE:                              CARDIAC CATHETERIZATION   PROCEDURE:  1.  Left heart catheterization.  2.  Coronary angiography.  3.  Left ventriculogram.   ATTENDING PHYSICIAN:  Darlin Priestly, MD   COMPLICATIONS:  None.   INDICATIONS FOR PROCEDURE:  Danny [name] is a 45 year old male patient of Dr.  Dani Gobble and Dr. Artis Delay, with positive family history of CAD, who  has had recurrent chest pain despite negative workup for ischemia.  He is  now referred for cardiac catheterization to rule out significant CAD.   DESCRIPTION OF PROCEDURE:  After giving informed written consent, the  patient was brought to the cardiac cath lab where the right groin was  shaved, prepped and draped in the usual sterile fashion.  Anesthesia  monitoring established.  Using modified Seldinger technique, #6 arterial  sheath was placed in the femoral artery.  A 6 French diagnostic catheter was  used to perform diagnostic angiography.  The left main is a large vessel  with no significant disease.  The LAD is a large vessel that coursed to the  apex and gives rise to two diagonal branches.  The LAD has no significant  disease.  The first diagonal is a medium size vessel that bifurcates  distally with no significant disease.  The second diagonal has no  significant disease.  The left circumflex is a large vessel coursing to the  AV and gives rise to one obtuse marginal branch.  The AV circumflex has no  significant disease.  The first obtuse marginal branch is a large vessel  that bifurcates in the mid segment with no significant disease.  The right  coronary artery is a large vessel and dominant and gives  rise to a PDA as  well as a posterolateral branch.  There is mild 30-40% narrowing in the  distal RCA.  The PDA and posterolateral branches have no significant  disease.  The left ventriculogram reveals a preserved EF of 50%.   HEMODYNAMICS:  Aortic pressure 133/85, LV pressure 130/1/6.   CONCLUSION:  1.  Noncritical coronary artery disease.  2.  Normal left ventricular systolic function.       RHM/MEDQ  D:  04/02/2005  T:  04/02/2005  Job:  [id number]   cc:   Dani Gobble, MD  Fax: [phone]   Madelin Rear. Sherwood Gambler, MD  [address]  [place]  [place] [address]  Fax: [phone]
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
## 2011-03-01 ENCOUNTER — Emergency Department (HOSPITAL_COMMUNITY)
Admission: EM | Admit: 2011-03-01 | Discharge: 2011-03-01 | Disposition: A | Discharge status: Home / Self Care | Payer: Self-pay | Attending: Emergency Medicine | Admitting: Emergency Medicine

## 2011-03-01 ENCOUNTER — Emergency Department (HOSPITAL_COMMUNITY): Payer: Self-pay

## 2011-03-01 DIAGNOSIS — R059 Cough, unspecified: Secondary | ICD-10-CM | POA: Insufficient documentation

## 2011-03-01 DIAGNOSIS — E119 Type 2 diabetes mellitus without complications: Secondary | ICD-10-CM | POA: Insufficient documentation

## 2011-03-01 DIAGNOSIS — Z8673 Personal history of transient ischemic attack (TIA), and cerebral infarction without residual deficits: Secondary | ICD-10-CM | POA: Insufficient documentation

## 2011-03-01 DIAGNOSIS — J40 Bronchitis, not specified as acute or chronic: Secondary | ICD-10-CM | POA: Insufficient documentation

## 2011-03-01 DIAGNOSIS — IMO0002 Reserved for concepts with insufficient information to code with codable children: Secondary | ICD-10-CM | POA: Insufficient documentation

## 2011-03-01 DIAGNOSIS — I2510 Atherosclerotic heart disease of native coronary artery without angina pectoris: Secondary | ICD-10-CM | POA: Insufficient documentation

## 2011-03-01 DIAGNOSIS — R05 Cough: Secondary | ICD-10-CM | POA: Insufficient documentation

## 2011-03-01 DIAGNOSIS — F411 Generalized anxiety disorder: Secondary | ICD-10-CM | POA: Insufficient documentation

## 2011-03-01 LAB — GLUCOSE, CAPILLARY: Glucose-Capillary: 231 mg/dL — ABNORMAL HIGH (ref 70–99)

## 2011-03-10 ENCOUNTER — Emergency Department (HOSPITAL_COMMUNITY): Payer: Self-pay

## 2011-03-10 ENCOUNTER — Emergency Department (HOSPITAL_COMMUNITY)
Admission: EM | Admit: 2011-03-10 | Discharge: 2011-03-10 | Disposition: A | Discharge status: Home / Self Care | Payer: Self-pay | Attending: Emergency Medicine | Admitting: Emergency Medicine

## 2011-03-10 DIAGNOSIS — Z7982 Long term (current) use of aspirin: Secondary | ICD-10-CM | POA: Insufficient documentation

## 2011-03-10 DIAGNOSIS — IMO0001 Reserved for inherently not codable concepts without codable children: Secondary | ICD-10-CM | POA: Insufficient documentation

## 2011-03-10 DIAGNOSIS — F411 Generalized anxiety disorder: Secondary | ICD-10-CM | POA: Insufficient documentation

## 2011-03-10 DIAGNOSIS — F329 Major depressive disorder, single episode, unspecified: Secondary | ICD-10-CM | POA: Insufficient documentation

## 2011-03-10 DIAGNOSIS — Z79899 Other long term (current) drug therapy: Secondary | ICD-10-CM | POA: Insufficient documentation

## 2011-03-10 DIAGNOSIS — R070 Pain in throat: Secondary | ICD-10-CM | POA: Insufficient documentation

## 2011-03-10 DIAGNOSIS — F3289 Other specified depressive episodes: Secondary | ICD-10-CM | POA: Insufficient documentation

## 2011-03-10 DIAGNOSIS — R197 Diarrhea, unspecified: Secondary | ICD-10-CM | POA: Insufficient documentation

## 2011-03-10 DIAGNOSIS — J40 Bronchitis, not specified as acute or chronic: Secondary | ICD-10-CM | POA: Insufficient documentation

## 2011-03-10 DIAGNOSIS — R5381 Other malaise: Secondary | ICD-10-CM | POA: Insufficient documentation

## 2011-03-10 DIAGNOSIS — E119 Type 2 diabetes mellitus without complications: Secondary | ICD-10-CM | POA: Insufficient documentation

## 2011-03-10 DIAGNOSIS — R112 Nausea with vomiting, unspecified: Secondary | ICD-10-CM | POA: Insufficient documentation

## 2011-03-10 DIAGNOSIS — I2510 Atherosclerotic heart disease of native coronary artery without angina pectoris: Secondary | ICD-10-CM | POA: Insufficient documentation

## 2011-03-10 LAB — DIFFERENTIAL
Basophils Absolute: 0.1 10*3/uL (ref 0.0–0.1)
Basophils Relative: 1 % (ref 0–1)
Eosinophils Absolute: 0.2 10*3/uL (ref 0.0–0.7)
Eosinophils Relative: 1 % (ref 0–5)
Lymphocytes Relative: 33 % (ref 12–46)
Lymphs Abs: 5.1 10*3/uL — ABNORMAL HIGH (ref 0.7–4.0)
Monocytes Absolute: 1 10*3/uL (ref 0.1–1.0)
Monocytes Relative: 6 % (ref 3–12)
Neutro Abs: 9.2 10*3/uL — ABNORMAL HIGH (ref 1.7–7.7)
Neutrophils Relative %: 59 % (ref 43–77)

## 2011-03-10 LAB — BASIC METABOLIC PANEL
BUN: 8 mg/dL (ref 6–23)
CO2: 22 mEq/L (ref 19–32)
Calcium: 10.8 mg/dL — ABNORMAL HIGH (ref 8.4–10.5)
Chloride: 97 mEq/L (ref 96–112)
Creatinine, Ser: 0.93 mg/dL (ref 0.50–1.35)
Glucose, Bld: 214 mg/dL — ABNORMAL HIGH (ref 70–99)

## 2011-03-10 LAB — CBC
HCT: 46.4 % (ref 39.0–52.0)
Hemoglobin: 16 g/dL (ref 13.0–17.0)
MCH: 28.8 pg (ref 26.0–34.0)
MCHC: 34.5 g/dL (ref 30.0–36.0)
MCV: 83.5 fL (ref 78.0–100.0)
Platelets: 341 10*3/uL (ref 150–400)
RBC: 5.56 MIL/uL (ref 4.22–5.81)
RDW: 12.4 % (ref 11.5–15.5)
WBC: 15.5 10*3/uL — ABNORMAL HIGH (ref 4.0–10.5)

## 2011-03-10 LAB — GLUCOSE, CAPILLARY: Glucose-Capillary: 227 mg/dL — ABNORMAL HIGH (ref 70–99)

## 2011-03-10 LAB — URINALYSIS, ROUTINE W REFLEX MICROSCOPIC
Bilirubin Urine: NEGATIVE
Glucose, UA: NEGATIVE mg/dL
Hgb urine dipstick: NEGATIVE
Ketones, ur: NEGATIVE mg/dL
Leukocytes, UA: NEGATIVE
Protein, ur: NEGATIVE mg/dL
pH: 6 (ref 5.0–8.0)

## 2011-06-10 LAB — CBC
MCHC: 34.5
MCV: 86.6
RDW: 12.1

## 2011-06-10 LAB — BASIC METABOLIC PANEL
BUN: 8
CO2: 26
Calcium: 9.6
Chloride: 96
Creatinine, Ser: 0.85
Glucose, Bld: 302 — ABNORMAL HIGH

## 2011-06-10 LAB — DIFFERENTIAL
Basophils Absolute: 0.1
Basophils Relative: 1
Eosinophils Absolute: 0.3
Monocytes Absolute: 0.7
Monocytes Relative: 7
Neutro Abs: 7.5
Neutrophils Relative %: 67

## 2011-06-11 LAB — VANCOMYCIN, TROUGH: Vancomycin Tr: 28.3

## 2011-06-11 LAB — GLUCOSE, CAPILLARY
Glucose-Capillary: 178 — ABNORMAL HIGH
Glucose-Capillary: 185 — ABNORMAL HIGH
Glucose-Capillary: 205 — ABNORMAL HIGH
Glucose-Capillary: 220 — ABNORMAL HIGH
Glucose-Capillary: 235 — ABNORMAL HIGH
Glucose-Capillary: 299 — ABNORMAL HIGH

## 2011-06-11 LAB — BASIC METABOLIC PANEL
BUN: 6
BUN: 7
CO2: 30
Calcium: 8.7
Chloride: 101
Chloride: 96
Chloride: 99
Creatinine, Ser: 0.85
Creatinine, Ser: 0.95
GFR calc Af Amer: >60
GFR calc Af Amer: >60
GFR calc non Af Amer: >60
Glucose, Bld: 182 — ABNORMAL HIGH
Glucose, Bld: 261 — ABNORMAL HIGH
Potassium: 4
Potassium: 4.1
Sodium: 133 — ABNORMAL LOW

## 2011-06-11 LAB — CBC
HCT: 37.9 — ABNORMAL LOW
HCT: 38.6 — ABNORMAL LOW
Hemoglobin: 12.9 — ABNORMAL LOW
MCHC: 34
MCHC: 34.2
MCV: 86.4
MCV: 87.2
Platelets: 203
Platelets: 282
RBC: 4.35
RBC: 4.75
RDW: 12.5
WBC: 8.6

## 2011-06-11 LAB — LIPID PANEL
LDL Cholesterol: 84
Total CHOL/HDL Ratio: 6.5
VLDL: 36

## 2011-06-11 LAB — CULTURE, BLOOD (ROUTINE X 2)
Culture: NO GROWTH
Culture: NO GROWTH

## 2011-06-11 LAB — HEMOGLOBIN A1C: Mean Plasma Glucose: 289

## 2011-06-11 LAB — ANAEROBIC CULTURE

## 2011-06-17 LAB — BASIC METABOLIC PANEL
BUN: 10
GFR calc Af Amer: >60
GFR calc non Af Amer: >60
Potassium: 3.9
Sodium: 140

## 2011-06-17 LAB — DIFFERENTIAL
Eosinophils Relative: 1
Lymphocytes Relative: 27
Lymphs Abs: 2.7
Monocytes Relative: 8

## 2011-06-17 LAB — CBC
HCT: 42.2
Platelets: 290
RBC: 4.84
WBC: 10.2

## 2011-06-17 LAB — ETHANOL: Alcohol, Ethyl (B): 247 — ABNORMAL HIGH

## 2011-09-04 ENCOUNTER — Other Ambulatory Visit: Payer: Self-pay

## 2011-09-04 ENCOUNTER — Emergency Department (HOSPITAL_COMMUNITY): Payer: Self-pay

## 2011-09-04 ENCOUNTER — Emergency Department (HOSPITAL_COMMUNITY)
Admission: EM | Admit: 2011-09-04 | Discharge: 2011-09-04 | Disposition: A | Discharge status: Home / Self Care | Payer: Self-pay | Attending: Emergency Medicine | Admitting: Emergency Medicine

## 2011-09-04 DIAGNOSIS — R29898 Other symptoms and signs involving the musculoskeletal system: Secondary | ICD-10-CM | POA: Insufficient documentation

## 2011-09-04 DIAGNOSIS — E119 Type 2 diabetes mellitus without complications: Secondary | ICD-10-CM | POA: Insufficient documentation

## 2011-09-04 DIAGNOSIS — F341 Dysthymic disorder: Secondary | ICD-10-CM | POA: Insufficient documentation

## 2011-09-04 DIAGNOSIS — Z9889 Other specified postprocedural states: Secondary | ICD-10-CM | POA: Insufficient documentation

## 2011-09-04 DIAGNOSIS — M545 Low back pain, unspecified: Secondary | ICD-10-CM | POA: Insufficient documentation

## 2011-09-04 DIAGNOSIS — M546 Pain in thoracic spine: Secondary | ICD-10-CM | POA: Insufficient documentation

## 2011-09-04 DIAGNOSIS — R51 Headache: Secondary | ICD-10-CM | POA: Insufficient documentation

## 2011-09-04 DIAGNOSIS — R071 Chest pain on breathing: Secondary | ICD-10-CM | POA: Insufficient documentation

## 2011-09-04 DIAGNOSIS — R209 Unspecified disturbances of skin sensation: Secondary | ICD-10-CM | POA: Insufficient documentation

## 2011-09-04 DIAGNOSIS — R55 Syncope and collapse: Secondary | ICD-10-CM | POA: Insufficient documentation

## 2011-09-04 DIAGNOSIS — M549 Dorsalgia, unspecified: Secondary | ICD-10-CM

## 2011-09-04 DIAGNOSIS — Z7982 Long term (current) use of aspirin: Secondary | ICD-10-CM | POA: Insufficient documentation

## 2011-09-04 DIAGNOSIS — F17200 Nicotine dependence, unspecified, uncomplicated: Secondary | ICD-10-CM | POA: Insufficient documentation

## 2011-09-04 DIAGNOSIS — W19XXXA Unspecified fall, initial encounter: Secondary | ICD-10-CM | POA: Insufficient documentation

## 2011-09-04 DIAGNOSIS — Z79899 Other long term (current) drug therapy: Secondary | ICD-10-CM | POA: Insufficient documentation

## 2011-09-04 HISTORY — DX: Dorsalgia, unspecified: M54.9

## 2011-09-04 HISTORY — DX: Depression, unspecified: F32.A

## 2011-09-04 HISTORY — DX: Major depressive disorder, single episode, unspecified: F32.9

## 2011-09-04 HISTORY — DX: Anxiety disorder, unspecified: F41.9

## 2011-09-04 LAB — BASIC METABOLIC PANEL
BUN: 8 mg/dL (ref 6–23)
CO2: 24 mEq/L (ref 19–32)
Chloride: 103 mEq/L (ref 96–112)
Glucose, Bld: 175 mg/dL — ABNORMAL HIGH (ref 70–99)
Potassium: 3.9 mEq/L (ref 3.5–5.1)
Sodium: 136 mEq/L (ref 135–145)

## 2011-09-04 LAB — DIFFERENTIAL
Lymphocytes Relative: 26 % (ref 12–46)
Lymphs Abs: 3.3 10*3/uL (ref 0.7–4.0)
Monocytes Relative: 7 % (ref 3–12)
Neutrophils Relative %: 64 % (ref 43–77)

## 2011-09-04 LAB — CBC
Hemoglobin: 16.3 g/dL (ref 13.0–17.0)
Platelets: 277 10*3/uL (ref 150–400)
RBC: 5.55 MIL/uL (ref 4.22–5.81)
WBC: 12.2 10*3/uL — ABNORMAL HIGH (ref 4.0–10.5)

## 2011-09-04 MED ORDER — HYDROMORPHONE HCL PF 1 MG/ML IJ SOLN
1.0000 mg | Freq: Once | INTRAMUSCULAR | Status: AC
  Administered 2011-09-04: 1 mg via INTRAVENOUS
  Filled 2011-09-04: qty 1

## 2011-09-04 MED ORDER — NICOTINE 21 MG/24HR TD PT24
21.0000 mg | MEDICATED_PATCH | Freq: Once | TRANSDERMAL | Status: DC
  Administered 2011-09-04: 21 mg via TRANSDERMAL
  Filled 2011-09-04: qty 1

## 2011-09-04 MED ORDER — OXYCODONE-ACETAMINOPHEN 5-325 MG PO TABS: 1.0000 | ORAL_TABLET | Freq: Four times a day (QID) | ORAL | Status: AC | PRN

## 2011-09-04 NOTE — ED Notes (Signed)
Pt back from MRI.  Pt requesting more pain medication and states pain of 9/10.  Pt resting on stretcher with respirations even and unlabored.  NAD at this time.  Family is at bedside.

## 2011-09-04 NOTE — ED Notes (Addendum)
Called Lab, let them know to add troponin.  Re-ordered per edp

## 2011-09-04 NOTE — ED Notes (Signed)
Pt understands to lie flat in bed and radiology aware as well.

## 2011-09-04 NOTE — ED Notes (Signed)
Pt to MRI

## 2011-09-04 NOTE — ED Notes (Signed)
Patient is alert and oriented x 4 with respirations even and unlabored.  NAD at this time.  Discharge instructions reviewed with patient and patient verbalized understanding.  Pt ambulated to lobby with steady gait, and family member to transport pt home.  

## 2011-09-04 NOTE — ED Provider Notes (Signed)
History     CSN: [id number]  Arrival date & time 09/04/11  1525   First MD Initiated Contact with Patient 09/04/11 1630      Chief Complaint  Patient presents with  . Back Pain     Patient is a 45 y.o. male presenting with back pain. The history is provided by the patient and a relative.  Back Pain  This is a new problem. The current episode started 1 to 2 hours ago. The problem occurs constantly. The problem has been gradually worsening. The pain is associated with falling. The pain is present in the thoracic spine and lumbar spine. The quality of the pain is described as stabbing. The pain does not radiate. The pain is severe. The symptoms are aggravated by twisting and certain positions. The pain is the same all the time. Associated symptoms include paresis, tingling and weakness. Pertinent negatives include no abdominal pain, no bowel incontinence and no bladder incontinence. He has tried nothing for the symptoms.  PT reports he was walking upstairs and he fell He is not able to recall what caused the fall He is unsure if he had proceeding cp/sob/dizziness before falls He now reports headache, back pain and right LE weakness He also reports right side chest wall pain after fall No abd pain is reported   Past Medical History  Diagnosis Date  . Back pain   . Diabetes mellitus   . Anxiety   . Depression     Past Surgical History  Procedure Date  . Mandible surgery   . Foot surgery     No family history on file.  History  Substance Use Topics  . Smoking status: Current Everyday Smoker  . Smokeless tobacco: Not on file  . Alcohol Use: No      Review of Systems  Gastrointestinal: Negative for abdominal pain and bowel incontinence.  Genitourinary: Negative for bladder incontinence.  Musculoskeletal: Positive for back pain.  Neurological: Positive for tingling and weakness.  All other systems reviewed and are negative.    Allergies  Review of patient's  allergies indicates no known allergies.  Home Medications   Current Outpatient Rx  Name Route Sig Dispense Refill  . ALPRAZOLAM 1 MG PO TABS Oral Take 1 mg by mouth 3 (three) times daily as needed. For anxiety     . ASPIRIN EC 81 MG PO TBEC Oral Take 81 mg by mouth daily.      Marland Kitchen METFORMIN HCL 500 MG PO TABS Oral Take 500 mg by mouth 2 (two) times daily with a meal.      . SERTRALINE HCL 100 MG PO TABS Oral Take 100 mg by mouth daily.        BP 149/80  Pulse 69  Temp(Src) 98.2 F (36.8 C) (Oral)  Resp 18  Ht 5\' 9"  (1.753 m)  Wt 200 lb (90.719 kg)  BMI 29.53 kg/m2  SpO2 96%  Physical Exam  CONSTITUTIONAL: Well developed/well nourished HEAD AND FACE: Normocephalic/atraumatic EYES: EOMI/PERRL ENMT: Mucous membranes moist NECK: supple no meningeal signs SPINE:cervical/thoracic/lumbar spine tender, no stepoffs/brusing noted Patient maintained in spinal precautions/logroll utilized CV: S1/S2 noted, no murmurs/rubs/gallops noted Chest - tender to right posterior ribs, no crepitance noted, no bruising noted LUNGS: Lungs are clear to auscultation bilaterally, no apparent distress ABDOMEN: soft, nontender, no rebound or guarding GU:no cva tenderness NEURO: Pt is awake/alert GCS 15 He is unable to resists gravity with his right LE, he also reports no sensation in the right LE All  other extremities are neuro intact without focal motor deficit EXTREMITIES: pulses normal, full ROM, no deformity/tenderness noted SKIN: warm, color normal PSYCH: no abnormalities of mood noted   ED Course  Procedures   Labs Reviewed  CBC - Abnormal; Notable for the following:    WBC 12.2 (*)    All other components within normal limits  DIFFERENTIAL - Abnormal; Notable for the following:    Neutro Abs 7.8 (*)    All other components within normal limits  BASIC METABOLIC PANEL - Abnormal; Notable for the following:    Glucose, Bld 175 (*)    All other components within normal limits  I-STAT  TROPONIN I     Pt seen, placed in full CTL precautions and c-collar ordered and applied by nursing Has weakness to right LE only, will obtain full spine imaging Also has right chest wall tenderness   6:26 PM Pt still with weakness to right LE Ct imaging negative D/w trauma surgeon dr Magnus Ivan, requests mr imaging Pt stable at this time  9:12 PM Mri without any acute findings Pt now ambulatory, he has full ROM of right hip/knee I discussed need for admission given syncope, unclear cause He refuses He accepts any consequences of leaving the hospital He will return if any of his symptoms return including cp/sob    MDM  Nursing notes reviewed and considered in documentation All labs/vitals reviewed and considered        Date: 09/04/2011  Rate: 85  Rhythm: normal sinus rhythm  QRS Axis: normal  Intervals: normal  ST/T Wave abnormalities: normal  Conduction Disutrbances:none  Narrative Interpretation:   Old EKG Reviewed: unchanged    Joya Gaskins, MD 09/04/11 2113

## 2011-09-04 NOTE — ED Notes (Signed)
Pt now c/o " can't feel right leg"  Dr. Bebe Shaggy aware.

## 2011-09-04 NOTE — ED Notes (Signed)
Pt was standing on steps and fell backwards on step. Pt states "have bad back" pt c/o severe pain. Pt unable to ambulate. Denies hitting head when feel on steps. No loc

## 2011-09-04 NOTE — ED Notes (Signed)
Pt requesting more pain medication before going to CT. Dr Bebe Shaggy aware

## 2011-09-09 ENCOUNTER — Encounter (HOSPITAL_COMMUNITY): Payer: Self-pay

## 2011-09-09 ENCOUNTER — Emergency Department (HOSPITAL_COMMUNITY)
Admission: EM | Admit: 2011-09-09 | Discharge: 2011-09-09 | Disposition: A | Discharge status: Home / Self Care | Payer: Self-pay | Attending: Emergency Medicine | Admitting: Emergency Medicine

## 2011-09-09 ENCOUNTER — Other Ambulatory Visit: Payer: Self-pay

## 2011-09-09 DIAGNOSIS — F17200 Nicotine dependence, unspecified, uncomplicated: Secondary | ICD-10-CM | POA: Insufficient documentation

## 2011-09-09 DIAGNOSIS — E119 Type 2 diabetes mellitus without complications: Secondary | ICD-10-CM | POA: Insufficient documentation

## 2011-09-09 DIAGNOSIS — F329 Major depressive disorder, single episode, unspecified: Secondary | ICD-10-CM | POA: Insufficient documentation

## 2011-09-09 DIAGNOSIS — R55 Syncope and collapse: Secondary | ICD-10-CM | POA: Insufficient documentation

## 2011-09-09 DIAGNOSIS — F411 Generalized anxiety disorder: Secondary | ICD-10-CM | POA: Insufficient documentation

## 2011-09-09 DIAGNOSIS — F3289 Other specified depressive episodes: Secondary | ICD-10-CM | POA: Insufficient documentation

## 2011-09-09 DIAGNOSIS — Z7982 Long term (current) use of aspirin: Secondary | ICD-10-CM | POA: Insufficient documentation

## 2011-09-09 DIAGNOSIS — M549 Dorsalgia, unspecified: Secondary | ICD-10-CM | POA: Insufficient documentation

## 2011-09-09 DIAGNOSIS — Z136 Encounter for screening for cardiovascular disorders: Secondary | ICD-10-CM | POA: Insufficient documentation

## 2011-09-09 LAB — CBC
HCT: 49 % (ref 39.0–52.0)
Hemoglobin: 16.8 g/dL (ref 13.0–17.0)
MCHC: 34.3 g/dL (ref 30.0–36.0)
RBC: 5.72 MIL/uL (ref 4.22–5.81)

## 2011-09-09 LAB — BASIC METABOLIC PANEL
BUN: 10 mg/dL (ref 6–23)
CO2: 27 mEq/L (ref 19–32)
Chloride: 101 mEq/L (ref 96–112)
Creatinine, Ser: 0.85 mg/dL (ref 0.50–1.35)
GFR calc Af Amer: >90 mL/min (ref >90)
Glucose, Bld: 132 mg/dL — ABNORMAL HIGH (ref 70–99)

## 2011-09-09 LAB — DIFFERENTIAL
Basophils Relative: 1 % (ref 0–1)
Lymphs Abs: 3.6 10*3/uL (ref 0.7–4.0)
Monocytes Absolute: 0.8 10*3/uL (ref 0.1–1.0)
Monocytes Relative: 7 % (ref 3–12)
Neutro Abs: 6.9 10*3/uL (ref 1.7–7.7)

## 2011-09-09 LAB — TROPONIN I: Troponin I: <0.3 ng/mL (ref <0.30)

## 2011-09-09 MED ORDER — SODIUM CHLORIDE 0.9 % IV SOLN
Freq: Once | INTRAVENOUS | Status: AC
  Administered 2011-09-09: 17:00:00 via INTRAVENOUS

## 2011-09-09 MED ORDER — ONDANSETRON HCL 4 MG/2ML IJ SOLN
4.0000 mg | Freq: Once | INTRAMUSCULAR | Status: AC
  Administered 2011-09-09: 4 mg via INTRAVENOUS
  Filled 2011-09-09: qty 2

## 2011-09-09 MED ORDER — HYDROMORPHONE HCL PF 1 MG/ML IJ SOLN
1.0000 mg | Freq: Once | INTRAMUSCULAR | Status: AC
  Administered 2011-09-09: 1 mg via INTRAVENOUS
  Filled 2011-09-09: qty 1

## 2011-09-09 MED ORDER — KETOROLAC TROMETHAMINE 30 MG/ML IJ SOLN
30.0000 mg | Freq: Once | INTRAMUSCULAR | Status: AC
  Administered 2011-09-09: 30 mg via INTRAVENOUS
  Filled 2011-09-09: qty 1

## 2011-09-09 MED ORDER — HYDROCODONE-ACETAMINOPHEN 5-325 MG PO TABS: 2.0000 | ORAL_TABLET | ORAL | Status: AC | PRN

## 2011-09-09 MED ORDER — HYDROMORPHONE HCL PF 2 MG/ML IJ SOLN
INTRAMUSCULAR | Status: AC
  Administered 2011-09-09: 2 mg via INTRAVENOUS
  Filled 2011-09-09: qty 1

## 2011-09-09 MED ORDER — HYDROMORPHONE HCL PF 1 MG/ML IJ SOLN: 2.0000 mg | Freq: Once | INTRAMUSCULAR | Status: DC

## 2011-09-09 NOTE — ED Provider Notes (Signed)
History   This chart was scribed for [employer]. Colon Branch, MD by Melba Coon. The patient was seen in room APA07/APA07 and the patient's care was started at 4:38PM.    CSN: [id number]  Arrival date & time 09/09/11  1422   First MD Initiated Contact with Patient 09/09/11 1626      Chief Complaint  Patient presents with  . Back Pain  . Loss of Consciousness  . Head Injury    (Consider location/radiation/quality/duration/timing/severity/associated sxs/prior treatment) HPI  Danny [name] is a 45 y.o. male who presents to the Emergency Department complaining of constant moderate to severe back pain with an onset 5 days ago. Back pain from syncope prompted the pt to present to the ED today. Last episode of syncope, pt hit his head. 5 days after, pt had another episode of syncope and hit his back on stairs. Standing up aggravates the pain while sitting down alleviates the pain. No nausea, vomit diarrhea, fever, or chills. Pt is a diabetic (today, pt states blood sugar was 160) and is not currently on any blood pressure meds. Pt also has a psychiatrist that he sees every 3 months; has lost recent family members. Pt is left-handed.  PCP: [facility]  Past Medical History  Diagnosis Date  . Back pain   . Diabetes mellitus   . Anxiety   . Depression     Past Surgical History  Procedure Date  . Mandible surgery   . Foot surgery     No family history on file.  History  Substance Use Topics  . Smoking status: Current Everyday Smoker  . Smokeless tobacco: Not on file  . Alcohol Use: No      Review of Systems 10 Systems reviewed and are negative for acute change except as noted in the HPI.  Allergies  Review of patient's allergies indicates no known allergies.  Home Medications   Current Outpatient Rx  Name Route Sig Dispense Refill  . ALPRAZOLAM 1 MG PO TABS Oral Take 1 mg by mouth 3 (three) times daily as needed. For anxiety     . ASPIRIN EC 81 MG PO TBEC Oral Take  81 mg by mouth daily.      Marland Kitchen METFORMIN HCL 500 MG PO TABS Oral Take 500 mg by mouth 2 (two) times daily with a meal.      . OXYCODONE-ACETAMINOPHEN 5-325 MG PO TABS Oral Take 1 tablet by mouth every 6 (six) hours as needed for pain. 15 tablet 0  . SERTRALINE HCL 100 MG PO TABS Oral Take 100 mg by mouth every morning.       BP 120/83  Pulse 99  Temp 98.2 F (36.8 C)  Resp 20  Ht 5\' 9"  (1.753 m)  Wt 215 lb (97.523 kg)  BMI 31.75 kg/m2  SpO2 99%  Physical Exam  Nursing note and vitals reviewed. Constitutional: He is oriented to person, place, and time. He appears well-developed and well-nourished. No distress.  HENT:  Head: Normocephalic and atraumatic.  Right Ear: External ear normal.  Left Ear: External ear normal.  Eyes: Conjunctivae and EOM are normal. Pupils are equal, round, and reactive to light.  Neck: Normal range of motion. Neck supple. No tracheal deviation present.  Cardiovascular: Normal rate, regular rhythm and normal heart sounds.  Exam reveals no gallop and no friction rub.   No murmur heard. Pulmonary/Chest: Effort normal and breath sounds normal. No respiratory distress.       Crackles at the bases.  Abdominal: Soft. Bowel sounds are normal. He exhibits no distension.  Musculoskeletal: Normal range of motion. He exhibits tenderness. He exhibits no edema.       Back discomfort to percussion, L2 L3 area; rt lumber paraspinal muscle tenderness on the right.  Positive straight right leg raise. Negative on the left.  Neurological: He is alert and oriented to person, place, and time. No sensory deficit.  Skin: Skin is warm and dry. No rash noted.  Psychiatric: He has a normal mood and affect. His behavior is normal.    ED Course  Procedures (including critical care time)  DIAGNOSTIC STUDIES: Oxygen Saturation is 99% on room air, normal by my interpretation.    COORDINATION OF CARE: Results for orders placed during the hospital encounter of 09/09/11  TROPONIN I        Component Value Range   Troponin I <0.30  <0.30 (ng/mL)  CBC      Component Value Range   WBC 11.5 (*) 4.0 - 10.5 (K/uL)   RBC 5.72  4.22 - 5.81 (MIL/uL)   Hemoglobin 16.8  13.0 - 17.0 (g/dL)   HCT 16.1  09.6 - 04.5 (%)   MCV 85.7  78.0 - 100.0 (fL)   MCH 29.4  26.0 - 34.0 (pg)   MCHC 34.3  30.0 - 36.0 (g/dL)   RDW 40.9  81.1 - 91.4 (%)   Platelets 262  150 - 400 (K/uL)  DIFFERENTIAL      Component Value Range   Neutrophils Relative 60  43 - 77 (%)   Neutro Abs 6.9  1.7 - 7.7 (K/uL)   Lymphocytes Relative 31  12 - 46 (%)   Lymphs Abs 3.6  0.7 - 4.0 (K/uL)   Monocytes Relative 7  3 - 12 (%)   Monocytes Absolute 0.8  0.1 - 1.0 (K/uL)   Eosinophils Relative 2  0 - 5 (%)   Eosinophils Absolute 0.2  0.0 - 0.7 (K/uL)   Basophils Relative 1  0 - 1 (%)   Basophils Absolute 0.1  0.0 - 0.1 (K/uL)  BASIC METABOLIC PANEL      Component Value Range   Sodium 137  135 - 145 (mEq/L)   Potassium 4.2  3.5 - 5.1 (mEq/L)   Chloride 101  96 - 112 (mEq/L)   CO2 27  19 - 32 (mEq/L)   Glucose, Bld 132 (*) 70 - 99 (mg/dL)   BUN 10  6 - 23 (mg/dL)   Creatinine, Ser 7.82  0.50 - 1.35 (mg/dL)   Calcium 95.6  8.4 - 10.5 (mg/dL)   GFR calc non Af Amer >90  >90 (mL/min)   GFR calc Af Amer >90  >90 (mL/min)    Date: 09/09/2011  1655  Rate: 87  Rhythm: normal sinus rhythm  QRS Axis: normal  Intervals: normal  ST/T Wave abnormalities: normal  Conduction Disutrbances:none  Narrative Interpretation:   Old EKG Reviewed: unchanged  6:02PM - recheck; pain medication slightly alleviated symptoms; Dr advises pt that more medication can be given here in ED or pt can go home and get pain meds at a pharmacy; Dr tells pt plans for d/c, pt agrees with plans      MDM  Patient with two episodes of syncope in the last 10 days, both with subsequent back pain. No recent illness to explain syncope. EKG 09/04/11 and today are normal. Negative orthostatics. Given IVF, analgesics with moderate relief of back  pain. Back pain has been worded up with CT and  MRI. No acute injuries identified. Will refer to orthopedics. If continued syncope, referral to cardiology. Pt stable in ED with no significant deterioration in condition.The patient appears reasonably screened and/or stabilized for discharge and I doubt any other medical condition or other [facility] requiring further screening, evaluation, or treatment in the ED at this time prior to discharge.  I personally performed the services described in this documentation, which was scribed in my presence. The recorded information has been reviewed and considered.  MDM Reviewed: nursing note and vitals Reviewed previous: labs, MRI and CT scan Interpretation: labs and ECG         Nicoletta Dress. Colon Branch, MD 09/09/11 [phone]

## 2011-09-09 NOTE — ED Notes (Signed)
Pt presents with back pain and head pain after standing up and having syncopal episode today. Pt states this also happened on 09/04/2011.

## 2011-09-09 NOTE — ED Notes (Signed)
States that he passed out this morning, stated that he checked CBG after incident and blod sugar was ok around 160, hx of same and was seen on 12/26 for same and was told that it was not related to heart

## 2011-12-15 ENCOUNTER — Encounter (HOSPITAL_COMMUNITY): Payer: Self-pay

## 2011-12-15 ENCOUNTER — Emergency Department (HOSPITAL_COMMUNITY)
Admission: EM | Admit: 2011-12-15 | Discharge: 2011-12-15 | Disposition: A | Discharge status: Home / Self Care | Payer: Self-pay | Attending: Emergency Medicine | Admitting: Emergency Medicine

## 2011-12-15 DIAGNOSIS — Z79899 Other long term (current) drug therapy: Secondary | ICD-10-CM | POA: Insufficient documentation

## 2011-12-15 DIAGNOSIS — F17200 Nicotine dependence, unspecified, uncomplicated: Secondary | ICD-10-CM | POA: Insufficient documentation

## 2011-12-15 DIAGNOSIS — E119 Type 2 diabetes mellitus without complications: Secondary | ICD-10-CM | POA: Insufficient documentation

## 2011-12-15 DIAGNOSIS — M545 Low back pain, unspecified: Secondary | ICD-10-CM | POA: Insufficient documentation

## 2011-12-15 DIAGNOSIS — M5430 Sciatica, unspecified side: Secondary | ICD-10-CM

## 2011-12-15 DIAGNOSIS — Z7982 Long term (current) use of aspirin: Secondary | ICD-10-CM | POA: Insufficient documentation

## 2011-12-15 MED ORDER — OXYCODONE-ACETAMINOPHEN 5-325 MG PO TABS
2.0000 | ORAL_TABLET | Freq: Once | ORAL | Status: AC
  Administered 2011-12-15: 2 via ORAL
  Filled 2011-12-15: qty 2

## 2011-12-15 MED ORDER — KETOROLAC TROMETHAMINE 60 MG/2ML IM SOLN
60.0000 mg | Freq: Once | INTRAMUSCULAR | Status: AC
Start: 2011-12-15 — End: 2011-12-15
  Administered 2011-12-15: 60 mg via INTRAMUSCULAR
  Filled 2011-12-15: qty 2

## 2011-12-15 MED ORDER — CYCLOBENZAPRINE HCL 10 MG PO TABS: 10.0000 mg | ORAL_TABLET | Freq: Three times a day (TID) | ORAL | Status: AC | PRN

## 2011-12-15 MED ORDER — OXYCODONE-ACETAMINOPHEN 5-325 MG PO TABS: 1.0000 | ORAL_TABLET | ORAL | Status: AC | PRN

## 2011-12-15 NOTE — ED Notes (Signed)
Has chronic back pain, unable to see md d/t money, back pain more severe today, no meds to take for pain, denies any recent injury

## 2011-12-15 NOTE — ED Provider Notes (Signed)
History     CSN: [id number]  Arrival date & time 12/15/11  1335   First MD Initiated Contact with Patient 12/15/11 1502      Chief Complaint  Patient presents with  . Back Pain    (Consider location/radiation/quality/duration/timing/severity/associated sxs/prior treatment) Patient is a 46 y.o. male presenting with back pain. The history is provided by the patient.  Back Pain  This is a recurrent problem. The current episode started more than 2 days ago. The problem occurs constantly. The problem has been gradually worsening. The pain is associated with no known injury. The pain is present in the lumbar spine. The quality of the pain is described as aching. The pain does not radiate. The pain is moderate. The symptoms are aggravated by bending, twisting and certain positions. The pain is the same all the time. Associated symptoms include leg pain. Pertinent negatives include no fever, no numbness, no headaches, no abdominal pain, no abdominal swelling, no bowel incontinence, no perianal numbness, no bladder incontinence, no dysuria, no pelvic pain, no paresthesias, no paresis, no tingling and no weakness. He has tried heat, ice and bed rest for the symptoms. The treatment provided no relief.    Past Medical History  Diagnosis Date  . Back pain   . Diabetes mellitus   . Anxiety   . Depression     Past Surgical History  Procedure Date  . Mandible surgery   . Foot surgery     No family history on file.  History  Substance Use Topics  . Smoking status: Current Everyday Smoker    Types: Cigarettes  . Smokeless tobacco: Not on file  . Alcohol Use: No      Review of Systems  Constitutional: Negative for fever, chills and appetite change.  HENT: Negative for neck pain and neck stiffness.   Respiratory: Negative for shortness of breath.   Gastrointestinal: Negative for vomiting, abdominal pain and bowel incontinence.  Genitourinary: Negative for bladder incontinence, dysuria,  hematuria, flank pain, decreased urine volume, difficulty urinating, testicular pain and pelvic pain.  Musculoskeletal: Positive for back pain.  Skin: Negative.   Neurological: Negative for tingling, weakness, numbness, headaches and paresthesias.  All other systems reviewed and are negative.    Allergies  Review of patient's allergies indicates no known allergies.  Home Medications   Current Outpatient Rx  Name Route Sig Dispense Refill  . ALPRAZOLAM 1 MG PO TABS Oral Take 1 mg by mouth 3 (three) times daily as needed. For anxiety     . ASPIRIN EC 81 MG PO TBEC Oral Take 81 mg by mouth daily.      Marland Kitchen METFORMIN HCL 500 MG PO TABS Oral Take 500 mg by mouth 2 (two) times daily with a meal.      . SERTRALINE HCL 100 MG PO TABS Oral Take 100 mg by mouth every morning.       BP 130/80  Pulse 99  Temp(Src) 97.9 F (36.6 C) (Oral)  Resp 20  Ht 5\' 10"  (1.778 m)  Wt 210 lb (95.255 kg)  BMI 30.13 kg/m2  SpO2 100%  Physical Exam  Nursing note and vitals reviewed. Constitutional: He is oriented to person, place, and time. He appears well-developed and well-nourished. No distress.  HENT:  Head: Normocephalic and atraumatic.  Cardiovascular: Normal rate, regular rhythm, normal heart sounds and intact distal pulses.   Pulmonary/Chest: Effort normal and breath sounds normal. No respiratory distress.  Musculoskeletal: Normal range of motion. He exhibits no tenderness.  Lumbar back: He exhibits tenderness. He exhibits normal range of motion, no bony tenderness, no swelling, no edema, no deformity, no laceration and normal pulse.       Back:       ttp of the lumbar paraspinal muscles.   Neurological: He is alert and oriented to person, place, and time. No sensory deficit. He exhibits normal muscle tone. Coordination normal.  Reflex Scores:      Patellar reflexes are 2+ on the right side and 2+ on the left side.      Achilles reflexes are 2+ on the right side and 2+ on the left  side. Skin: Skin is warm and dry.    ED Course  Procedures (including critical care time)    Patient had MRI of lumbar spine on 09/04/2011.  Results were reviewed by me.   MDM  IM toradol and po percocet given  Pt has ttp of the lumbar paraspinal muscles.  No focal neuro deficits on exam.  Pt ambulates with a crutch.  Gait is slow but steady.  I have advised pt that he can contact the social worker with the hospital.  He agrees to care plan and to retuen to ER if his sx's worsen.    Patient / Family / Caregiver understand and agree with initial ED impression and plan with expectations set for ED visit. Pt stable in ED with no significant deterioration in condition. Pt feels improved after observation and/or treatment in ED.     Cheyrl Buley L. Stephan Draughn, [place] 12/18/11 1626

## 2011-12-15 NOTE — Discharge Instructions (Signed)
Chronic Back Pain When back pain lasts longer than 3 months, it is called chronic back pain.This pain can be frustrating, but the cause of the pain is rarely dangerous.People with chronic back pain often go through certain periods that are more intense (flare-ups). CAUSES Chronic back pain can be caused by wear and tear (degeneration) on different structures in your back. These structures may include bones, ligaments, or discs. This degeneration may result in more pressure being placed on the nerves that travel to your legs and feet. This can lead to pain traveling from the low back down the back of the legs. When pain lasts longer than 3 months, it is not unusual for people to experience anxiety or depression. Anxiety and depression can also contribute to low back pain. TREATMENT  Establish a regular exercise plan. This is critical to improving your functional level.   Have a self-management plan for when you flare-up. Flare-ups rarely require a medical visit. Regular exercise will help reduce the intensity and frequency of your flare-ups.   Manage how you feel about your back pain and the rest of your life. Anxiety, depression, and feeling that you cannot alter your back pain have been shown to make back pain more intense and debilitating.   Medicines should never be your only treatment. They should be used along with other treatments to help you return to a more active lifestyle.   Procedures such as injections or surgery may be helpful but are rarely necessary. You may be able to get the same results with physical therapy or chiropractic care.  HOME CARE INSTRUCTIONS  Avoid bending, heavy lifting, prolonged sitting, and activities which make the problem worse.   Continue normal activity as much as possible.   Take brief periods of rest throughout the day to reduce your pain during flare-ups.   Follow your back exercise rehabilitation program. This can help reduce symptoms and prevent  more pain.   Only take over-the-counter or prescription medicines as directed by your caregiver. Muscle relaxants are sometimes prescribed. Narcotic pain medicine is discouraged for long-term pain, since addiction is a possible outcome.   If you smoke, quit.   Eat healthy foods and maintain a recommended body weight.  SEEK IMMEDIATE MEDICAL CARE IF:   You have weakness or numbness in one of your legs or feet.   You have trouble controlling your bladder or bowels.   You develop nausea, vomiting, abdominal pain, shortness of breath, or fainting.  Document Released: 10/03/2004 Document Revised: 08/15/2011 Document Reviewed: 08/10/2011 [name] Patient Information 2012 [place], [place].Sciatica Sciatica is a name for lower back pain caused by pressure on the sciatic nerve. The back pain can spread to the buttocks and back of the leg. HOME CARE   Rest as much as possible.   Only take medicine as told by your doctor.   Apply cold or heat to your back as told by your doctor.   Do not bend, lift, or sit for a long time until your pain is better.   Do not do anything that makes the condition worse.   Start normal activity when the pain is better.   Keep all follow-up visits.  GET HELP RIGHT AWAY IF:   There is more pain.   There is weakness or numbness in the legs.   You cannot control when you poop (bowel movement) or pee (urinate).  MAKE SURE YOU:   Understand these instructions.   Will watch this condition.   Will get help right  away if you are not doing well or get worse.  Document Released: 06/04/2008 Document Revised: 08/15/2011 Document Reviewed: 06/04/2008 [facility] Patient Information 2012 [place], [place].

## 2011-12-15 NOTE — ED Notes (Signed)
Pt sitting up in bed talking on cell phone. Pt had noted difficulty moving in bed.

## 2011-12-15 NOTE — ED Notes (Signed)
Pt presents with chronic lower back pain. States fell in January which aggravated his "deterating disc disease" . Pt is using crutches for support as he is having difficulty ambulating. Pt was reffered to orthopedic MD But " doesn't have $ to see". Pt denies re-injury, bowel or bladder incontinence.

## 2011-12-20 NOTE — ED Provider Notes (Signed)
Medical screening examination/treatment/procedure(s) were performed by non-physician practitioner and as supervising physician I was immediately available for consultation/collaboration.   Linnell Swords L Zowie Lundahl, MD 12/20/11 1811 

## 2012-01-29 ENCOUNTER — Encounter (HOSPITAL_COMMUNITY): Payer: Self-pay | Admitting: Emergency Medicine

## 2012-01-29 ENCOUNTER — Emergency Department (HOSPITAL_COMMUNITY)
Admission: EM | Admit: 2012-01-29 | Discharge: 2012-01-29 | Disposition: A | Discharge status: Home / Self Care | Payer: Self-pay | Attending: Emergency Medicine | Admitting: Emergency Medicine

## 2012-01-29 ENCOUNTER — Emergency Department (HOSPITAL_COMMUNITY): Payer: Self-pay

## 2012-01-29 DIAGNOSIS — W208XXA Other cause of strike by thrown, projected or falling object, initial encounter: Secondary | ICD-10-CM | POA: Insufficient documentation

## 2012-01-29 DIAGNOSIS — E119 Type 2 diabetes mellitus without complications: Secondary | ICD-10-CM | POA: Insufficient documentation

## 2012-01-29 DIAGNOSIS — Z79899 Other long term (current) drug therapy: Secondary | ICD-10-CM | POA: Insufficient documentation

## 2012-01-29 DIAGNOSIS — F341 Dysthymic disorder: Secondary | ICD-10-CM | POA: Insufficient documentation

## 2012-01-29 DIAGNOSIS — S9031XA Contusion of right foot, initial encounter: Secondary | ICD-10-CM

## 2012-01-29 DIAGNOSIS — S90129A Contusion of unspecified lesser toe(s) without damage to nail, initial encounter: Secondary | ICD-10-CM | POA: Insufficient documentation

## 2012-01-29 MED ORDER — HYDROCODONE-ACETAMINOPHEN 5-325 MG PO TABS
1.0000 | ORAL_TABLET | Freq: Once | ORAL | Status: AC
  Administered 2012-01-29: 1 via ORAL
  Filled 2012-01-29: qty 1

## 2012-01-29 MED ORDER — IBUPROFEN 800 MG PO TABS
800.0000 mg | ORAL_TABLET | Freq: Once | ORAL | Status: AC
  Administered 2012-01-29: 800 mg via ORAL
  Filled 2012-01-29: qty 1

## 2012-01-29 MED ORDER — HYDROCODONE-ACETAMINOPHEN 5-325 MG PO TABS: 1.0000 | ORAL_TABLET | Freq: Four times a day (QID) | ORAL | Status: AC | PRN

## 2012-01-29 NOTE — ED Provider Notes (Signed)
Medical screening examination/treatment/procedure(s) were performed by non-physician practitioner and as supervising physician I was immediately available for consultation/collaboration.  Mazy Culton, MD 01/29/12 1114 

## 2012-01-29 NOTE — ED Provider Notes (Signed)
History     CSN: [id number]  Arrival date & time 01/29/12  0804   First MD Initiated Contact with Patient 01/29/12 [phone]      Chief Complaint  Patient presents with  . Foot Pain    (Consider location/radiation/quality/duration/timing/severity/associated sxs/prior treatment) HPI Comments: Dropped a wheelbarrow full of dirt on R foot yest.  Unable to bear weight.  Using crutches he had from a recent achilles injury.  Has local foot doctor.  Patient is a 46 y.o. male presenting with lower extremity pain. The history is provided by the patient. No language interpreter was used.  Foot Pain This is a new problem. The current episode started yesterday. The problem occurs constantly. The problem has been unchanged. Pertinent negatives include no numbness or weakness. The symptoms are aggravated by walking and standing. He has tried nothing for the symptoms.    Past Medical History  Diagnosis Date  . Back pain   . Diabetes mellitus   . Anxiety   . Depression     Past Surgical History  Procedure Date  . Mandible surgery   . Foot surgery     No family history on file.  History  Substance Use Topics  . Smoking status: Current Everyday Smoker    Types: Cigarettes  . Smokeless tobacco: Not on file  . Alcohol Use: No      Review of Systems  Musculoskeletal:       Foot injury  Neurological: Negative for weakness and numbness.    Allergies  Review of patient's allergies indicates no known allergies.  Home Medications   Current Outpatient Rx  Name Route Sig Dispense Refill  . ACETAMINOPHEN 500 MG PO TABS Oral Take 1,000 mg by mouth every 6 (six) hours as needed. For pain    . ALPRAZOLAM 1 MG PO TABS Oral Take 1 mg by mouth 3 (three) times daily as needed. For anxiety     . ASPIRIN EC 81 MG PO TBEC Oral Take 81 mg by mouth daily.      Marland Kitchen GLIPIZIDE 10 MG PO TABS Oral Take 10 mg by mouth 2 (two) times daily before a meal.    . METFORMIN HCL 500 MG PO TABS Oral Take 500 mg by  mouth 2 (two) times daily with a meal.      . SERTRALINE HCL 100 MG PO TABS Oral Take 100 mg by mouth every morning.     Marland Kitchen HYDROCODONE-ACETAMINOPHEN 5-325 MG PO TABS Oral Take 1 tablet by mouth every 6 (six) hours as needed for pain. 20 tablet 0    BP 136/79  Pulse 87  Temp 98 F (36.7 C)  Resp 18  Ht 5\' 9"  (1.753 m)  Wt 200 lb (90.719 kg)  BMI 29.53 kg/m2  SpO2 97%  Physical Exam  Nursing note and vitals reviewed. Constitutional: He is oriented to person, place, and time. He appears well-developed and well-nourished.  HENT:  Head: Normocephalic and atraumatic.  Eyes: EOM are normal.  Neck: Normal range of motion.  Cardiovascular: Normal rate, regular rhythm, normal heart sounds and intact distal pulses.   Pulmonary/Chest: Effort normal and breath sounds normal. No respiratory distress.  Abdominal: Soft. He exhibits no distension. There is no tenderness.  Musculoskeletal: He exhibits tenderness.       Pain swelling and PT over R 1st MTP.  Skin intact.  Pain with weight bearing.  Neurological: He is alert and oriented to person, place, and time.  Skin: Skin is warm and dry.  Psychiatric: He has a normal mood and affect. Judgment normal.    ED Course  Procedures (including critical care time)  Labs Reviewed - No data to display Dg Foot Complete Right  01/29/2012  *RADIOLOGY REPORT*  Clinical Data: Post-traumatic forefoot pain.  RIGHT FOOT COMPLETE - 3+ VIEW  Comparison: Radiographs 01/17/2011.  Findings: There is no evidence of acute fracture or dislocation. Postsurgical changes within the calcaneal tuberosity are stable. There are stable degenerative changes at the metatarsal phalangeal joints, most advanced at the first metatarsal phalangeal joint. The Lisfranc alignment is normal.  Chronic calcifications within the soft tissues surrounding the calcaneus are stable.  No focal soft tissue swelling is evident.  IMPRESSION: Stable chronic degenerative and postsurgical changes.  No  acute osseous findings.  Original Report Authenticated By: Gerrianne Scale, M.D.     1. Foot contusion, right, initial encounter       MDM  No acute osseous injury on x-ray.  Ace wrap, post op shoe, ice, elevation, ibuprofen and rx-hydrocodone, 20.  F/u with your foot MD.        Worthy Rancher, PA 01/29/12 [phone]

## 2012-01-29 NOTE — Discharge Instructions (Signed)
Cryotherapy Cryotherapy means treatment with cold. Ice or gel packs can be used to reduce both pain and swelling. Ice is the most helpful within the first 24 to 48 hours after an injury or flareup from overusing a muscle or joint. Sprains, strains, spasms, burning pain, shooting pain, and aches can all be eased with ice. Ice can also be used when recovering from surgery. Ice is effective, has very few side effects, and is safe for most people to use. PRECAUTIONS  Ice is not a safe treatment option for people with:  Raynaud's phenomenon. This is a condition affecting small blood vessels in the extremities. Exposure to cold may cause your problems to return.   Cold hypersensitivity. There are many forms of cold hypersensitivity, including:   Cold urticaria. Red, itchy hives appear on the skin when the tissues begin to warm after being iced.   Cold erythema. This is a red, itchy rash caused by exposure to cold.   Cold hemoglobinuria. Red blood cells break down when the tissues begin to warm after being iced. The hemoglobin that carry oxygen are passed into the urine because they cannot combine with blood proteins fast enough.   Numbness or altered sensitivity in the area being iced.  If you have any of the following conditions, do not use ice until you have discussed cryotherapy with your caregiver:  Heart conditions, such as arrhythmia, angina, or chronic heart disease.   High blood pressure.   Healing wounds or open skin in the area being iced.   Current infections.   Rheumatoid arthritis.   Poor circulation.   Diabetes.  Ice slows the blood flow in the region it is applied. This is beneficial when trying to stop inflamed tissues from spreading irritating chemicals to surrounding tissues. However, if you expose your skin to cold temperatures for too long or without the proper protection, you can damage your skin or nerves. Watch for signs of skin damage due to cold. HOME CARE  INSTRUCTIONS Follow these tips to use ice and cold packs safely.  Place a dry or damp towel between the ice and skin. A damp towel will cool the skin more quickly, so you may need to shorten the time that the ice is used.   For a more rapid response, add gentle compression to the ice.   Ice for no more than 10 to 20 minutes at a time. The bonier the area you are icing, the less time it will take to get the benefits of ice.   Check your skin after 5 minutes to make sure there are no signs of a poor response to cold or skin damage.   Rest 20 minutes or more in between uses.   Once your skin is numb, you can end your treatment. You can test numbness by very lightly touching your skin. The touch should be so light that you do not see the skin dimple from the pressure of your fingertip. When using ice, most people will feel these normal sensations in this order: cold, burning, aching, and numbness.   Do not use ice on someone who cannot communicate their responses to pain, such as small children or people with dementia.  HOW TO MAKE AN ICE PACK Ice packs are the most common way to use ice therapy. Other methods include ice massage, ice baths, and cryo-sprays. Muscle creams that cause a cold, tingly feeling do not offer the same benefits that ice offers and should not be used as a substitute  unless recommended by your caregiver. To make an ice pack, do one of the following:  Place crushed ice or a bag of frozen vegetables in a sealable plastic bag. Squeeze out the excess air. Place this bag inside another plastic bag. Slide the bag into a pillowcase or place a damp towel between your skin and the bag.   Mix 3 parts water with 1 part rubbing alcohol. Freeze the mixture in a sealable plastic bag. When you remove the mixture from the freezer, it will be slushy. Squeeze out the excess air. Place this bag inside another plastic bag. Slide the bag into a pillowcase or place a damp towel between your skin  and the bag.  SEEK MEDICAL CARE IF:  You develop white spots on your skin. This may give the skin a blotchy (mottled) appearance.   Your skin turns blue or pale.   Your skin becomes waxy or hard.   Your swelling gets worse.  MAKE SURE YOU:   Understand these instructions.   Will watch your condition.   Will get help right away if you are not doing well or get worse.  Document Released: 04/22/2011 Document Revised: 08/15/2011 Document Reviewed: 04/22/2011 [facility] Patient Information 2012 [place], [place].Contusion A contusion is a deep bruise. Contusions are the result of an injury that caused bleeding under the skin. The contusion may turn blue, purple, or yellow. Minor injuries will give you a painless contusion, but more severe contusions may stay painful and swollen for a few weeks.  CAUSES  A contusion is usually caused by a blow, trauma, or direct force to an area of the body. SYMPTOMS   Swelling and redness of the injured area.   Bruising of the injured area.   Tenderness and soreness of the injured area.   Pain.  DIAGNOSIS  The diagnosis can be made by taking a history and physical exam. An X-ray, CT scan, or MRI may be needed to determine if there were any associated injuries, such as fractures. TREATMENT  Specific treatment will depend on what area of the body was injured. In general, the best treatment for a contusion is resting, icing, elevating, and applying cold compresses to the injured area. Over-the-counter medicines may also be recommended for pain control. Ask your caregiver what the best treatment is for your contusion. HOME CARE INSTRUCTIONS   Put ice on the injured area.   Put ice in a plastic bag.   Place a towel between your skin and the bag.   Leave the ice on for 15 to 20 minutes, 3 to 4 times a day.   Only take over-the-counter or prescription medicines for pain, discomfort, or fever as directed by your caregiver. Your caregiver may recommend  avoiding anti-inflammatory medicines (aspirin, ibuprofen, and naproxen) for 48 hours because these medicines may increase bruising.   Rest the injured area.   If possible, elevate the injured area to reduce swelling.  SEEK IMMEDIATE MEDICAL CARE IF:   You have increased bruising or swelling.   You have pain that is getting worse.   Your swelling or pain is not relieved with medicines.  MAKE SURE YOU:   Understand these instructions.   Will watch your condition.   Will get help right away if you are not doing well or get worse.  Document Released: 06/05/2005 Document Revised: 08/15/2011 Document Reviewed: 07/01/2011 [facility] Patient Information 2012 [place], [place].   Wear the ace wrap and wooden shoe for comfort.  Apply ice several times daily and  elevate as much as possible.  Use your crutches as needed and bear weight as tolerated.  Take ibuprofen up to 800 mg every 8 hrs with food.  Take the pain medicine as directed.  Follow up with your podiatrist as needed.

## 2012-01-29 NOTE — ED Notes (Signed)
Pt c/o right foot pain after dropping a loaded wheel barrel on to it last night.

## 2012-01-29 NOTE — ED Notes (Signed)
Ace wrap applied to right foot, along with post-op boot.

## 2012-08-25 ENCOUNTER — Other Ambulatory Visit: Payer: Self-pay

## 2012-08-25 ENCOUNTER — Encounter (HOSPITAL_COMMUNITY): Payer: Self-pay | Admitting: Emergency Medicine

## 2012-08-25 ENCOUNTER — Emergency Department (HOSPITAL_COMMUNITY)
Admission: EM | Admit: 2012-08-25 | Discharge: 2012-08-26 | Disposition: A | Discharge status: Home / Self Care | Payer: Self-pay | Attending: Emergency Medicine | Admitting: Emergency Medicine

## 2012-08-25 ENCOUNTER — Emergency Department (HOSPITAL_COMMUNITY): Payer: Self-pay

## 2012-08-25 DIAGNOSIS — F329 Major depressive disorder, single episode, unspecified: Secondary | ICD-10-CM | POA: Insufficient documentation

## 2012-08-25 DIAGNOSIS — R05 Cough: Secondary | ICD-10-CM

## 2012-08-25 DIAGNOSIS — F3289 Other specified depressive episodes: Secondary | ICD-10-CM | POA: Insufficient documentation

## 2012-08-25 DIAGNOSIS — R0789 Other chest pain: Secondary | ICD-10-CM | POA: Insufficient documentation

## 2012-08-25 DIAGNOSIS — Z7982 Long term (current) use of aspirin: Secondary | ICD-10-CM | POA: Insufficient documentation

## 2012-08-25 DIAGNOSIS — I10 Essential (primary) hypertension: Secondary | ICD-10-CM | POA: Insufficient documentation

## 2012-08-25 DIAGNOSIS — R0602 Shortness of breath: Secondary | ICD-10-CM | POA: Insufficient documentation

## 2012-08-25 DIAGNOSIS — F17200 Nicotine dependence, unspecified, uncomplicated: Secondary | ICD-10-CM | POA: Insufficient documentation

## 2012-08-25 DIAGNOSIS — R059 Cough, unspecified: Secondary | ICD-10-CM | POA: Insufficient documentation

## 2012-08-25 DIAGNOSIS — Z79899 Other long term (current) drug therapy: Secondary | ICD-10-CM | POA: Insufficient documentation

## 2012-08-25 DIAGNOSIS — R079 Chest pain, unspecified: Secondary | ICD-10-CM

## 2012-08-25 DIAGNOSIS — R51 Headache: Secondary | ICD-10-CM | POA: Insufficient documentation

## 2012-08-25 DIAGNOSIS — E119 Type 2 diabetes mellitus without complications: Secondary | ICD-10-CM | POA: Insufficient documentation

## 2012-08-25 DIAGNOSIS — F411 Generalized anxiety disorder: Secondary | ICD-10-CM | POA: Insufficient documentation

## 2012-08-25 HISTORY — DX: Essential (primary) hypertension: I10

## 2012-08-25 LAB — CBC WITH DIFFERENTIAL/PLATELET
Basophils Relative: 0 % (ref 0–1)
Eosinophils Absolute: 0.3 10*3/uL (ref 0.0–0.7)
Eosinophils Relative: 3 % (ref 0–5)
HCT: 40.4 % (ref 39.0–52.0)
Hemoglobin: 14.2 g/dL (ref 13.0–17.0)
Lymphs Abs: 4.3 10*3/uL — ABNORMAL HIGH (ref 0.7–4.0)
MCH: 30.1 pg (ref 26.0–34.0)
MCHC: 35.1 g/dL (ref 30.0–36.0)
MCV: 85.6 fL (ref 78.0–100.0)
Monocytes Absolute: 0.6 10*3/uL (ref 0.1–1.0)
Monocytes Relative: 6 % (ref 3–12)
RBC: 4.72 MIL/uL (ref 4.22–5.81)

## 2012-08-25 LAB — COMPREHENSIVE METABOLIC PANEL
Albumin: 3.9 g/dL (ref 3.5–5.2)
Alkaline Phosphatase: 85 U/L (ref 39–117)
BUN: 9 mg/dL (ref 6–23)
Creatinine, Ser: 1.05 mg/dL (ref 0.50–1.35)
GFR calc Af Amer: >90 mL/min (ref >90)
Glucose, Bld: 214 mg/dL — ABNORMAL HIGH (ref 70–99)
Total Bilirubin: 0.1 mg/dL — ABNORMAL LOW (ref 0.3–1.2)
Total Protein: 6.8 g/dL (ref 6.0–8.3)

## 2012-08-25 MED ORDER — SODIUM CHLORIDE 0.9 % IV SOLN
Freq: Once | INTRAVENOUS | Status: AC
  Administered 2012-08-25: 23:00:00 via INTRAVENOUS

## 2012-08-25 MED ORDER — ONDANSETRON HCL 4 MG/2ML IJ SOLN
4.0000 mg | Freq: Once | INTRAMUSCULAR | Status: AC
  Administered 2012-08-25: 4 mg via INTRAVENOUS
  Filled 2012-08-25: qty 2

## 2012-08-25 MED ORDER — ALBUTEROL SULFATE (5 MG/ML) 0.5% IN NEBU
5.0000 mg | INHALATION_SOLUTION | Freq: Once | RESPIRATORY_TRACT | Status: AC
  Administered 2012-08-25: 5 mg via RESPIRATORY_TRACT
  Filled 2012-08-25: qty 1

## 2012-08-25 MED ORDER — MORPHINE SULFATE 4 MG/ML IJ SOLN
2.0000 mg | Freq: Once | INTRAMUSCULAR | Status: AC
  Administered 2012-08-25: 2 mg via INTRAVENOUS
  Filled 2012-08-25: qty 1

## 2012-08-25 NOTE — ED Notes (Signed)
Patient states that he started having chest pain and shortness of breath about 2 days ago, along with a headache.  States that he has been under a lot of stress recently, not working, having pain in his right foot from surgery and is unable to obtain pain medications.  EMS medicated with 324 mg ASA, nitro 0.4 mg SL x1 tablet.

## 2012-08-25 NOTE — ED Provider Notes (Signed)
History   This chart was scribed for [employer]. Colon Branch, MD, by Frederik Pear, ER scribe. The patient was seen in room APA01/APA01 and the patient's care was started at 2310.    CSN: [id number]  Arrival date & time 08/25/12  2221   First MD Initiated Contact with Patient 08/25/12 2310      Chief Complaint  Patient presents with  . Chest Pain  . Shortness of Breath    (Consider location/radiation/quality/duration/timing/severity/associated sxs/prior treatment) HPI Comments: Danny [name] is a 46 y.o. male who presents to the Emergency Department complaining of a waxing and waning, sharp to heavy chest pain with associated SOB and coughing that began 2 days ago. He also reports an associated headache that began today. He states that he was given 1 nitroglycerin by EMS in route to ED. He ha a h/o of surgery to his right foot. He is a current, 1 pack a day, every day smoker.     Past Medical History  Diagnosis Date  . Back pain   . Diabetes mellitus   . Anxiety   . Depression   . Hypertension     Past Surgical History  Procedure Date  . Mandible surgery   . Foot surgery     No family history on file.  History  Substance Use Topics  . Smoking status: Current Every Day Smoker    Types: Cigarettes  . Smokeless tobacco: Not on file  . Alcohol Use: No      Review of Systems A complete 10 system review of systems was obtained and all systems are negative except as noted in the HPI and PMH.   Allergies  Review of patient's allergies indicates no known allergies.  Home Medications   Current Outpatient Rx  Name  Route  Sig  Dispense  Refill  . ACETAMINOPHEN 500 MG PO TABS   Oral   Take 1,000 mg by mouth every 6 (six) hours as needed. For pain         . ALPRAZOLAM 1 MG PO TABS   Oral   Take 1 mg by mouth 3 (three) times daily as needed. For anxiety          . ASPIRIN EC 81 MG PO TBEC   Oral   Take 81 mg by mouth daily.           Marland Kitchen GLIPIZIDE 10 MG PO  TABS   Oral   Take 10 mg by mouth 2 (two) times daily before a meal.         . METFORMIN HCL 500 MG PO TABS   Oral   Take 500 mg by mouth 2 (two) times daily with a meal.           . SERTRALINE HCL 100 MG PO TABS   Oral   Take 100 mg by mouth every morning.            BP 121/60  Pulse 75  Temp 98.2 F (36.8 C) (Oral)  Resp 16  Ht 5\' 9"  (1.753 m)  Wt 200 lb (90.719 kg)  BMI 29.53 kg/m2  SpO2 96%  Physical Exam  Nursing note and vitals reviewed. Constitutional: He is oriented to person, place, and time. He appears well-developed and well-nourished.  HENT:  Head: Normocephalic and atraumatic.  Neck: Normal range of motion. Neck supple.  Cardiovascular: Normal rate, regular rhythm and normal heart sounds.   No murmur heard. Pulmonary/Chest: Effort normal. No respiratory distress. He has wheezes.  He has an occasional expiratory wheeze, which is greater on the right than the left.  Abdominal: Soft. There is no tenderness.  Musculoskeletal: He exhibits no edema.  Neurological: He is alert and oriented to person, place, and time.  Skin: Skin is warm and dry.  Psychiatric: He has a normal mood and affect. Thought content normal.    ED Course  Procedures (including critical care time)  DIAGNOSTIC STUDIES: Oxygen Saturation is 99% on room air, normal by my interpretation.    COORDINATION OF CARE:  23:17- Discussed planned course of treatment with the patient, including a breathing treatment and chest X-ray, who is agreeable at this time.  Results for orders placed during the hospital encounter of 08/25/12  TROPONIN I      Component Value Range   Troponin I <0.30  <0.30 ng/mL  CBC WITH DIFFERENTIAL      Component Value Range   WBC 10.0  4.0 - 10.5 K/uL   RBC 4.72  4.22 - 5.81 MIL/uL   Hemoglobin 14.2  13.0 - 17.0 g/dL   HCT 16.1  09.6 - 04.5 %   MCV 85.6  78.0 - 100.0 fL   MCH 30.1  26.0 - 34.0 pg   MCHC 35.1  30.0 - 36.0 g/dL   RDW 40.9  81.1 - 91.4 %    Platelets 276  150 - 400 K/uL   Neutrophils Relative 48  43 - 77 %   Neutro Abs 4.7  1.7 - 7.7 K/uL   Lymphocytes Relative 43  12 - 46 %   Lymphs Abs 4.3 (*) 0.7 - 4.0 K/uL   Monocytes Relative 6  3 - 12 %   Monocytes Absolute 0.6  0.1 - 1.0 K/uL   Eosinophils Relative 3  0 - 5 %   Eosinophils Absolute 0.3  0.0 - 0.7 K/uL   Basophils Relative 0  0 - 1 %   Basophils Absolute 0.0  0.0 - 0.1 K/uL  COMPREHENSIVE METABOLIC PANEL      Component Value Range   Sodium 138  135 - 145 mEq/L   Potassium 3.1 (*) 3.5 - 5.1 mEq/L   Chloride 104  96 - 112 mEq/L   CO2 24  19 - 32 mEq/L   Glucose, Bld 214 (*) 70 - 99 mg/dL   BUN 9  6 - 23 mg/dL   Creatinine, Ser 7.82  0.50 - 1.35 mg/dL   Calcium 9.3  8.4 - 95.6 mg/dL   Total Protein 6.8  6.0 - 8.3 g/dL   Albumin 3.9  3.5 - 5.2 g/dL   AST 17  0 - 37 U/L   ALT 27  0 - 53 U/L   Alkaline Phosphatase 85  39 - 117 U/L   Total Bilirubin 0.1 (*) 0.3 - 1.2 mg/dL   GFR calc non Af Amer 83 (*) >90 mL/min   GFR calc Af Amer >90  >90 mL/min     Dg Chest Portable 1 View  08/25/2012  *RADIOLOGY REPORT*  Clinical Data: Chest pain, shortness of breath.  PORTABLE CHEST - 1 VIEW  Comparison: 09/04/2011  Findings: Nodular density projecting over the right upper lobe is favored to be external such as an EKG lead.  Lungs are otherwise radiographically clear. There was a 4 mm nodule on the prior chest CT within the right lower lobe (series 7 image 35) that was unchanged from 2006.  No pleural effusion or pneumothorax. The cardiomediastinal contours are within normal limits. The visualized bones and  soft tissues are without significant appreciable abnormality.  IMPRESSION: Nodular density projecting over the right upper lobe.  Correlate clinically as this is favored to be external such as an EKG lead.  No radiographic evidence of acute cardiopulmonary process.   Original Report Authenticated By: Jearld Lesch, M.D.      Date: 08/26/2012   2157  Rate: 91  Rhythm:  normal sinus rhythm  QRS Axis: normal  Intervals: normal  ST/T Wave abnormalities: normal  Conduction Disutrbances: none  Narrative Interpretation: unremarkable       MDM  Patient presents with chest discomfort x 2 days both at rest and with exertion. Has had a cough. Given NTG by EMS with no improvement. Labs are unremarkable. EKG and chest xray without acute findings. Given analgesic, antiemetic with improvement.Dx testing d/w pt and family.  Questions answered.  Verb understanding, agreeable to d/c home with outpt f/u. Pt feels improved after observation and/or treatment in ED.Pt stable in ED with no significant deterioration in condition.The patient appears reasonably screened and/or stabilized for discharge and I doubt any other medical condition or other [facility] requiring further screening, evaluation, or treatment in the ED at this time prior to discharge.   I personally performed the services described in this documentation, which was scribed in my presence. The recorded information has been reviewed and considered.   MDM Reviewed: nursing note and vitals Interpretation: labs, ECG and x-ray         Nicoletta Dress. Colon Branch, MD 08/26/12 [phone]

## 2012-08-25 NOTE — ED Notes (Addendum)
Pt very tearful and reluctant to talk on initial presentation to the ER.  Somewhat more talkative at present, continues to have episodes of crying.  States he does not want to talk to anyone about his depression.

## 2012-08-25 NOTE — ED Notes (Signed)
Pt states that he was at home and was going to go to the bathroom, stood up and everything went black, states he thinks he fell to the ground and does not remember how he got out of the floor.

## 2013-06-28 ENCOUNTER — Emergency Department (HOSPITAL_COMMUNITY)
Admission: EM | Admit: 2013-06-28 | Discharge: 2013-06-28 | Disposition: A | Discharge status: Home / Self Care | Payer: Self-pay | Attending: Emergency Medicine | Admitting: Emergency Medicine

## 2013-06-28 ENCOUNTER — Emergency Department (HOSPITAL_COMMUNITY): Payer: Self-pay

## 2013-06-28 ENCOUNTER — Encounter (HOSPITAL_COMMUNITY): Payer: Self-pay | Admitting: Emergency Medicine

## 2013-06-28 DIAGNOSIS — Z79899 Other long term (current) drug therapy: Secondary | ICD-10-CM | POA: Insufficient documentation

## 2013-06-28 DIAGNOSIS — Z8739 Personal history of other diseases of the musculoskeletal system and connective tissue: Secondary | ICD-10-CM | POA: Insufficient documentation

## 2013-06-28 DIAGNOSIS — I10 Essential (primary) hypertension: Secondary | ICD-10-CM | POA: Insufficient documentation

## 2013-06-28 DIAGNOSIS — F411 Generalized anxiety disorder: Secondary | ICD-10-CM | POA: Insufficient documentation

## 2013-06-28 DIAGNOSIS — F17200 Nicotine dependence, unspecified, uncomplicated: Secondary | ICD-10-CM | POA: Insufficient documentation

## 2013-06-28 DIAGNOSIS — Z7982 Long term (current) use of aspirin: Secondary | ICD-10-CM | POA: Insufficient documentation

## 2013-06-28 DIAGNOSIS — R55 Syncope and collapse: Secondary | ICD-10-CM | POA: Insufficient documentation

## 2013-06-28 DIAGNOSIS — E119 Type 2 diabetes mellitus without complications: Secondary | ICD-10-CM | POA: Insufficient documentation

## 2013-06-28 DIAGNOSIS — S069X9A Unspecified intracranial injury with loss of consciousness of unspecified duration, initial encounter: Secondary | ICD-10-CM

## 2013-06-28 DIAGNOSIS — Y92009 Unspecified place in unspecified non-institutional (private) residence as the place of occurrence of the external cause: Secondary | ICD-10-CM | POA: Insufficient documentation

## 2013-06-28 DIAGNOSIS — Y939 Activity, unspecified: Secondary | ICD-10-CM | POA: Insufficient documentation

## 2013-06-28 DIAGNOSIS — W1809XA Striking against other object with subsequent fall, initial encounter: Secondary | ICD-10-CM | POA: Insufficient documentation

## 2013-06-28 DIAGNOSIS — S060X9A Concussion with loss of consciousness of unspecified duration, initial encounter: Secondary | ICD-10-CM | POA: Insufficient documentation

## 2013-06-28 LAB — BASIC METABOLIC PANEL
BUN: 9 mg/dL (ref 6–23)
CO2: 25 mEq/L (ref 19–32)
Chloride: 102 mEq/L (ref 96–112)
Creatinine, Ser: 0.92 mg/dL (ref 0.50–1.35)
Glucose, Bld: 155 mg/dL — ABNORMAL HIGH (ref 70–99)
Potassium: 3.8 mEq/L (ref 3.5–5.1)

## 2013-06-28 LAB — CBC WITH DIFFERENTIAL/PLATELET
HCT: 47 % (ref 39.0–52.0)
Hemoglobin: 16.1 g/dL (ref 13.0–17.0)
Lymphocytes Relative: 30 % (ref 12–46)
Lymphs Abs: 4.3 10*3/uL — ABNORMAL HIGH (ref 0.7–4.0)
MCHC: 34.3 g/dL (ref 30.0–36.0)
Monocytes Absolute: 0.8 10*3/uL (ref 0.1–1.0)
Monocytes Relative: 6 % (ref 3–12)
Neutro Abs: 9.1 10*3/uL — ABNORMAL HIGH (ref 1.7–7.7)
RBC: 5.33 MIL/uL (ref 4.22–5.81)
WBC: 14.5 10*3/uL — ABNORMAL HIGH (ref 4.0–10.5)

## 2013-06-28 LAB — GLUCOSE, CAPILLARY: Glucose-Capillary: 211 mg/dL — ABNORMAL HIGH (ref 70–99)

## 2013-06-28 MED ORDER — MORPHINE SULFATE 4 MG/ML IJ SOLN
4.0000 mg | Freq: Once | INTRAMUSCULAR | Status: AC
  Administered 2013-06-28: 4 mg via INTRAVENOUS
  Filled 2013-06-28: qty 1

## 2013-06-28 MED ORDER — METOCLOPRAMIDE HCL 5 MG/ML IJ SOLN
10.0000 mg | Freq: Once | INTRAMUSCULAR | Status: AC
  Administered 2013-06-28: 10 mg via INTRAVENOUS
  Filled 2013-06-28: qty 2

## 2013-06-28 MED ORDER — SODIUM CHLORIDE 0.9 % IV BOLUS (SEPSIS)
1000.0000 mL | Freq: Once | INTRAVENOUS | Status: AC
  Administered 2013-06-28: 1000 mL via INTRAVENOUS

## 2013-06-28 MED ORDER — DIPHENHYDRAMINE HCL 50 MG/ML IJ SOLN
25.0000 mg | Freq: Once | INTRAMUSCULAR | Status: AC
  Administered 2013-06-28: 25 mg via INTRAVENOUS
  Filled 2013-06-28: qty 1

## 2013-06-28 NOTE — ED Provider Notes (Signed)
CSN: [id number]     Arrival date & time 06/28/13  1517 History   First MD Initiated Contact with Patient 06/28/13 1637     Chief Complaint  Patient presents with  . Near Syncope  . Fall    Patient is a 47 y.o. male presenting with syncope. The history is provided by the patient.  Loss of Consciousness Episode history:  Single Most recent episode:  Today Progression:  Improving Chronicity:  Recurrent Relieved by:  Nothing Worsened by:  Nothing tried Associated symptoms: headaches   Associated symptoms: no chest pain, no fever, no shortness of breath, no vomiting and no weakness   pt presents for multiple complaints He reports he had syncopal episode earlier today after standing up - he reports he has these episodes frequently, typically with position change No cp.  No new SOB No focal weakness He reports during fall he hit his head and may have injured his right arm.   No new neck or back pain No new weakness  He reports poor med compliance as he can not afford meds  Past Medical History  Diagnosis Date  . Back pain   . Diabetes mellitus   . Anxiety   . Depression   . Hypertension    Past Surgical History  Procedure Laterality Date  . Mandible surgery    . Foot surgery     History reviewed. No pertinent family history. History  Substance Use Topics  . Smoking status: Current Every Day Smoker -- 0.25 packs/day    Types: Cigarettes  . Smokeless tobacco: Not on file  . Alcohol Use: No    Review of Systems  Constitutional: Negative for fever.  Respiratory: Negative for shortness of breath.   Cardiovascular: Positive for syncope. Negative for chest pain.  Gastrointestinal: Negative for vomiting.  Neurological: Positive for headaches. Negative for weakness.  All other systems reviewed and are negative.    Allergies  Tylenol  Home Medications   Current Outpatient Rx  Name  Route  Sig  Dispense  Refill  . aspirin EC 81 MG tablet   Oral   Take 81 mg by  mouth daily.           . diazepam (VALIUM) 5 MG tablet   Oral   Take 5 mg by mouth 4 (four) times daily as needed for anxiety.         Marland Kitchen glipiZIDE (GLUCOTROL) 10 MG tablet   Oral   Take 10 mg by mouth 2 (two) times daily before a meal.         . ibuprofen (ADVIL,MOTRIN) 200 MG tablet   Oral   Take 400 mg by mouth every 6 (six) hours as needed for pain.         . metFORMIN (GLUCOPHAGE) 500 MG tablet   Oral   Take 500 mg by mouth 2 (two) times daily with a meal.            BP 127/82  Pulse 81  Temp(Src) 98.1 F (36.7 C) (Oral)  Resp 18  Ht 5\' 9"  (1.753 m)  Wt 195 lb (88.451 kg)  BMI 28.78 kg/m2  SpO2 97% Physical Exam CONSTITUTIONAL: Well developed/well nourished HEAD: Normocephalic/atraumatic. Tenderness to forehead.  No deformity.   EYES: EOMI/PERRL ENMT: Mucous membranes moist, No evidence of facial/nasal trauma NECK: supple no meningeal signs SPINE:entire spine nontender, No bruising/crepitance/stepoffs noted to spine CV: S1/S2 noted, no murmurs/rubs/gallops noted LUNGS: Lungs are clear to auscultation bilaterally, no apparent distress ABDOMEN: soft,  nontender, no rebound or guarding GU:no cva tenderness NEURO: Pt is awake/alert, moves all extremitiesx4, EXTREMITIES: pulses normal, full ROM. Tenderness to palpation and ROM of right shoulder/elbow All other extremities/joints palpated/ranged and nontender SKIN: warm, color normal PSYCH:anxious  ED Course  Procedures (including critical care time) Labs Review Labs Reviewed  GLUCOSE, CAPILLARY - Abnormal; Notable for the following:    Glucose-Capillary 211 (*)    All other components within normal limits  CBC WITH DIFFERENTIAL - Abnormal; Notable for the following:    WBC 14.5 (*)    Neutro Abs 9.1 (*)    Lymphs Abs 4.3 (*)    All other components within normal limits  BASIC METABOLIC PANEL - Abnormal; Notable for the following:    Glucose, Bld 155 (*)    All other components within normal limits    Imaging Review No results found.  EKG Interpretation     Ventricular Rate:  87 PR Interval:  164 QRS Duration: 74 QT Interval:  340 QTC Calculation: 409 R Axis:   16 Text Interpretation:  Normal sinus rhythm Possible Left atrial enlargement Borderline ECG When compared with ECG of 25-Aug-2012 21:57, No significant change was found             6:36 PM Pt presents for syncope, HA after fall and also poor glucose control He is in no distress at this time I suspect his syncope today was from position change I doubt acute cardiac dysrhythmia at this time Will get CT head due to fall/headache 8:05 PM Imaging and labs unremarkable Pt is able to range right shoulder/elbow and imaging negative He is able to ambulate at baseline He feels improved I feel his syncope can be managed as outpatient given chronicity of symptoms Pt agreeable with plan  MDM  No diagnosis found. Nursing notes including past medical history and social history reviewed and considered in documentation xrays reviewed and considered Labs/vital reviewed and considered Previous records reviewed and considered - previous echo showed normal EF/valves     Joya Gaskins, MD 06/28/13 2006

## 2013-06-28 NOTE — ED Notes (Signed)
Blacked out at home and fell.  Hit head above R eye an back of head.  Has been having pain in R elbow to shoulder.  Reports he is diabetic and cannot afford his meds.  Syncopal episodes occur once every couple months.

## 2013-06-28 NOTE — ED Notes (Signed)
Pt states that he blacked out today and hit head above rt eye, hx of multiple syncopal spells, states he is not taking diabetes meds as prescribed because he can't afford them, also c/o rt arm pain; pt alert and oriented to name, age, [name] and place

## 2013-06-28 NOTE — ED Notes (Signed)
Patient given discharge instruction, verbalized understand. IV removed, band aid applied. Patient ambulatory out of the department.  

## 2013-06-30 ENCOUNTER — Emergency Department (HOSPITAL_COMMUNITY): Payer: Self-pay

## 2013-06-30 ENCOUNTER — Encounter (HOSPITAL_COMMUNITY): Payer: Self-pay | Admitting: Emergency Medicine

## 2013-06-30 ENCOUNTER — Emergency Department (HOSPITAL_COMMUNITY)
Admission: EM | Admit: 2013-06-30 | Discharge: 2013-07-01 | Disposition: A | Discharge status: Home / Self Care | Payer: Self-pay | Attending: Emergency Medicine | Admitting: Emergency Medicine

## 2013-06-30 DIAGNOSIS — E119 Type 2 diabetes mellitus without complications: Secondary | ICD-10-CM | POA: Insufficient documentation

## 2013-06-30 DIAGNOSIS — Z7982 Long term (current) use of aspirin: Secondary | ICD-10-CM | POA: Insufficient documentation

## 2013-06-30 DIAGNOSIS — S0990XA Unspecified injury of head, initial encounter: Secondary | ICD-10-CM | POA: Insufficient documentation

## 2013-06-30 DIAGNOSIS — W1809XA Striking against other object with subsequent fall, initial encounter: Secondary | ICD-10-CM | POA: Insufficient documentation

## 2013-06-30 DIAGNOSIS — S8990XA Unspecified injury of unspecified lower leg, initial encounter: Secondary | ICD-10-CM | POA: Insufficient documentation

## 2013-06-30 DIAGNOSIS — I10 Essential (primary) hypertension: Secondary | ICD-10-CM | POA: Insufficient documentation

## 2013-06-30 DIAGNOSIS — Y929 Unspecified place or not applicable: Secondary | ICD-10-CM | POA: Insufficient documentation

## 2013-06-30 DIAGNOSIS — Y9389 Activity, other specified: Secondary | ICD-10-CM | POA: Insufficient documentation

## 2013-06-30 DIAGNOSIS — F17200 Nicotine dependence, unspecified, uncomplicated: Secondary | ICD-10-CM | POA: Insufficient documentation

## 2013-06-30 DIAGNOSIS — Z79899 Other long term (current) drug therapy: Secondary | ICD-10-CM | POA: Insufficient documentation

## 2013-06-30 DIAGNOSIS — S8991XA Unspecified injury of right lower leg, initial encounter: Secondary | ICD-10-CM

## 2013-06-30 DIAGNOSIS — F411 Generalized anxiety disorder: Secondary | ICD-10-CM | POA: Insufficient documentation

## 2013-06-30 MED ORDER — ONDANSETRON HCL 4 MG/2ML IJ SOLN
4.0000 mg | Freq: Once | INTRAMUSCULAR | Status: AC
  Administered 2013-06-30: 4 mg via INTRAVENOUS
  Filled 2013-06-30: qty 2

## 2013-06-30 MED ORDER — FENTANYL CITRATE 0.05 MG/ML IJ SOLN
50.0000 ug | INTRAMUSCULAR | Status: DC | PRN
  Administered 2013-06-30 – 2013-07-01 (×3): 50 ug via INTRAVENOUS
  Filled 2013-06-30 (×3): qty 2

## 2013-06-30 NOTE — ED Notes (Signed)
Pt c/o rt leg pain from knee down since falling and dropping tv on the leg.

## 2013-07-01 MED ORDER — KETOROLAC TROMETHAMINE 30 MG/ML IJ SOLN
30.0000 mg | Freq: Once | INTRAMUSCULAR | Status: AC
  Administered 2013-07-01: 30 mg via INTRAVENOUS
  Filled 2013-07-01: qty 1

## 2013-07-01 MED ORDER — TRAMADOL HCL 50 MG PO TABS: 50.0000 mg | ORAL_TABLET | Freq: Four times a day (QID) | ORAL | Status: DC | PRN

## 2013-07-01 MED ORDER — IBUPROFEN 800 MG PO TABS: 800.0000 mg | ORAL_TABLET | Freq: Three times a day (TID) | ORAL | Status: DC

## 2013-07-01 NOTE — ED Notes (Signed)
Patient states that he will call his wife to bring him his crutches from home when he had knee surgery. States that he doesn't want to pay for another set.

## 2013-07-01 NOTE — ED Provider Notes (Signed)
CSN: [id number]     Arrival date & time 06/30/13  2308 History   First MD Initiated Contact with Patient 06/30/13 2310     Chief Complaint  Patient presents with  . Leg Pain  . Fall   (Consider location/radiation/quality/duration/timing/severity/associated sxs/prior Treatment) Patient is a 47 y.o. male presenting with leg pain and fall.  Leg Pain Associated symptoms: no back pain, no fatigue and no neck pain   Fall Pertinent negatives include no chest pain, no abdominal pain and no shortness of breath.   History provided by patient. Brought in by EMS after right leg injury. Was moving a TV, states that fell onto his right leg causing him to fall backwards. He states he hit his head but no LOC. He denies any neck pain or back injury. He complains of severe pain in his right leg from his knee to his ankle. He sustained abrasion. No bleeding or laceration. No associated weakness or numbness. Pain is sharp in quality.   Past Medical History  Diagnosis Date  . Back pain   . Diabetes mellitus   . Anxiety   . Depression   . Hypertension    Past Surgical History  Procedure Laterality Date  . Mandible surgery    . Foot surgery     History reviewed. No pertinent family history. History  Substance Use Topics  . Smoking status: Current Every Day Smoker -- 0.25 packs/day    Types: Cigarettes  . Smokeless tobacco: Not on file  . Alcohol Use: No    Review of Systems  Constitutional: Negative for diaphoresis and fatigue.  Eyes: Negative for visual disturbance.  Respiratory: Negative for shortness of breath.   Cardiovascular: Negative for chest pain.  Gastrointestinal: Negative for vomiting and abdominal pain.  Genitourinary: Negative for dysuria.  Musculoskeletal: Negative for back pain, neck pain and neck stiffness.  Skin: Positive for wound. Negative for rash.  Neurological: Negative for syncope and weakness.  All other systems reviewed and are negative.    Allergies   Tylenol  Home Medications   Current Outpatient Rx  Name  Route  Sig  Dispense  Refill  . aspirin EC 81 MG tablet   Oral   Take 81 mg by mouth daily.           . diazepam (VALIUM) 5 MG tablet   Oral   Take 5 mg by mouth 4 (four) times daily as needed for anxiety.         Marland Kitchen glipiZIDE (GLUCOTROL) 10 MG tablet   Oral   Take 10 mg by mouth 2 (two) times daily before a meal.         . ibuprofen (ADVIL,MOTRIN) 200 MG tablet   Oral   Take 400 mg by mouth every 6 (six) hours as needed for pain.         . metFORMIN (GLUCOPHAGE) 500 MG tablet   Oral   Take 500 mg by mouth 2 (two) times daily with a meal.            BP 140/91  Pulse 107  Temp(Src) 98.1 F (36.7 C)  Resp 24  Ht 6' (1.829 m)  Wt 200 lb (90.719 kg)  BMI 27.12 kg/m2  SpO2 98% Physical Exam  Constitutional: He is oriented to person, place, and time. He appears well-developed and well-nourished.  HENT:  Head: Normocephalic and atraumatic.  No contusion, swelling or laceration  Eyes: EOM are normal. Pupils are equal, round, and reactive to light.  Neck:  Trachea normal.  Cervical collar in place. No palpable deformity to the cervical spine  Cardiovascular: Normal rate, regular rhythm, S1 normal, S2 normal and normal pulses.     No systolic murmur is present   No diastolic murmur is present  Pulses:      Radial pulses are 2+ on the right side, and 2+ on the left side.  Pulmonary/Chest: Effort normal and breath sounds normal. He has no rhonchi. He exhibits no tenderness.  Abdominal: Soft. Normal appearance and bowel sounds are normal. There is no tenderness. There is no CVA tenderness and negative Murphy's sign.  Musculoskeletal:  Right lower extremity with tenderness and superficial abrasion over the patella. No significant effusion. There is also tenderness to the mid anterior tibial region with again superficial abrasion. No active bleeding. No bony deformity. Mild tenderness over the anterior ankle.  Skin intact throughout. Distal neurovascular intact. Pelvis stable without tenderness over the hips. No tenderness over the thoracic or lumbar spine.  Neurological: He is alert and oriented to person, place, and time. He has normal strength. No cranial nerve deficit or sensory deficit. GCS eye subscore is 4. GCS verbal subscore is 5. GCS motor subscore is 6.  Skin: Skin is warm and dry. No rash noted. He is not diaphoretic.  Psychiatric: His speech is normal.  Cooperative and appropriate    ED Course  Procedures (including critical care time) Labs Review Labs Reviewed - No data to display Imaging Review Dg Tibia/fibula Right  07/01/2013   CLINICAL DATA:  Fall  EXAM: RIGHT TIBIA AND FIBULA - 2 VIEW  COMPARISON:  01/17/2011  FINDINGS: Negative for fracture or focal bony lesion. Surgical screw in the posterior calcaneus from prior Achilles tendon repair.  IMPRESSION: Negative for fracture.   Electronically Signed   By: Marlan Palau M.D.   On: 07/01/2013 00:09   Dg Ankle Complete Right  07/01/2013   CLINICAL DATA:  Fall.  EXAM: RIGHT ANKLE - COMPLETE 3+ VIEW  COMPARISON:  01/17/2011  FINDINGS: Negative for acute fracture. Degenerative change in the talonavicular joint with spurring.  Surgical metal anchor in the posterior calcaneus from Achilles tendon repair. There is calcification in the region of the Achilles tendon. There is spurring of the plantar surface of the calcaneus.  Ankle mortise intact.  IMPRESSION: Negative for acute fracture.   Electronically Signed   By: Marlan Palau M.D.   On: 07/01/2013 00:10   Ct Head Wo Contrast  07/01/2013   CLINICAL DATA:  Syncopal episode with fall on 06/2013. Patient fell after dropping a TV on the right leg tonight.  EXAM: CT HEAD WITHOUT CONTRAST  CT CERVICAL SPINE WITHOUT CONTRAST  TECHNIQUE: Multidetector CT imaging of the head and cervical spine was performed following the standard protocol without intravenous contrast. Multiplanar CT image  reconstructions of the cervical spine were also generated.  COMPARISON:  CT head 06/2013. CT head and cervical spine 09/04/2011  FINDINGS: CT HEAD FINDINGS  Ventricles and sulci appear symmetrical. No mass effect or midline shift. No abnormal extra-axial fluid collections. Gray-white matter junctions are distinct. Basal cisterns are not effaced. No evidence of acute intracranial hemorrhage. No depressed skull fractures. Visualized paranasal sinuses and mastoid air cells are not opacified. No significant change since previous study.  CT CERVICAL SPINE FINDINGS  Normal alignment of the cervical spine. Degenerative changes with narrowed cervical interspaces and endplate hypertrophic changes at C3-4, and C6-7 levels. No vertebral compression deformities. No prevertebral soft tissue swelling. No focal bone lesion or  bone destruction. Bone cortex and trabecular architecture appear intact. Degenerative changes in the facet joints.  IMPRESSION:  CT Head: No acute intracranial abnormalities.  CT cervical spine: Degenerative changes. No displaced fractures identified.   Electronically Signed   By: Burman Nieves M.D.   On: 07/01/2013 00:31   Ct Cervical Spine Wo Contrast  07/01/2013   CLINICAL DATA:  Syncopal episode with fall on 06/2013. Patient fell after dropping a TV on the right leg tonight.  EXAM: CT HEAD WITHOUT CONTRAST  CT CERVICAL SPINE WITHOUT CONTRAST  TECHNIQUE: Multidetector CT imaging of the head and cervical spine was performed following the standard protocol without intravenous contrast. Multiplanar CT image reconstructions of the cervical spine were also generated.  COMPARISON:  CT head 06/2013. CT head and cervical spine 09/04/2011  FINDINGS: CT HEAD FINDINGS  Ventricles and sulci appear symmetrical. No mass effect or midline shift. No abnormal extra-axial fluid collections. Gray-white matter junctions are distinct. Basal cisterns are not effaced. No evidence of acute intracranial hemorrhage. No  depressed skull fractures. Visualized paranasal sinuses and mastoid air cells are not opacified. No significant change since previous study.  CT CERVICAL SPINE FINDINGS  Normal alignment of the cervical spine. Degenerative changes with narrowed cervical interspaces and endplate hypertrophic changes at C3-4, and C6-7 levels. No vertebral compression deformities. No prevertebral soft tissue swelling. No focal bone lesion or bone destruction. Bone cortex and trabecular architecture appear intact. Degenerative changes in the facet joints.  IMPRESSION:  CT Head: No acute intracranial abnormalities.  CT cervical spine: Degenerative changes. No displaced fractures identified.   Electronically Signed   By: Burman Nieves M.D.   On: 07/01/2013 00:31   Dg Knee Complete 4 Views Right  07/01/2013   CLINICAL DATA:  Fall. Leg injury  EXAM: RIGHT KNEE - COMPLETE 4+ VIEW  COMPARISON:  None.  FINDINGS: Normal alignment no fracture. Mild joint space narrowing medially. Mild spurring of the patella. No joint effusion.  IMPRESSION: No acute abnormality.   Electronically Signed   By: Marlan Palau M.D.   On: 07/01/2013 00:11    IV fentanyl. IV Zofran. Ice. Imaging reviewed and patient placed in Ace wrap to right knee and given crutches with orthopedic referral.  MDM  Diagnosis: Right leg contusion and abrasions Imaging reviewed as above no acute bony injuries Improved with IV narcotics Vital signs nursing notes reviewed  Sunnie Nielsen, MD 07/01/13 0510

## 2013-07-01 NOTE — ED Notes (Signed)
Patient taken off backboard as directed by MD with assistance from two NTs.

## 2013-07-09 ENCOUNTER — Emergency Department (HOSPITAL_COMMUNITY)
Admission: EM | Admit: 2013-07-09 | Discharge: 2013-07-09 | Disposition: A | Discharge status: Home / Self Care | Payer: Self-pay | Attending: Emergency Medicine | Admitting: Emergency Medicine

## 2013-07-09 ENCOUNTER — Emergency Department (HOSPITAL_COMMUNITY): Payer: Self-pay

## 2013-07-09 ENCOUNTER — Encounter (HOSPITAL_COMMUNITY): Payer: Self-pay | Admitting: Emergency Medicine

## 2013-07-09 DIAGNOSIS — F3289 Other specified depressive episodes: Secondary | ICD-10-CM | POA: Insufficient documentation

## 2013-07-09 DIAGNOSIS — F411 Generalized anxiety disorder: Secondary | ICD-10-CM | POA: Insufficient documentation

## 2013-07-09 DIAGNOSIS — F329 Major depressive disorder, single episode, unspecified: Secondary | ICD-10-CM | POA: Insufficient documentation

## 2013-07-09 DIAGNOSIS — Z79899 Other long term (current) drug therapy: Secondary | ICD-10-CM | POA: Insufficient documentation

## 2013-07-09 DIAGNOSIS — R569 Unspecified convulsions: Secondary | ICD-10-CM | POA: Insufficient documentation

## 2013-07-09 DIAGNOSIS — R079 Chest pain, unspecified: Secondary | ICD-10-CM | POA: Insufficient documentation

## 2013-07-09 DIAGNOSIS — I10 Essential (primary) hypertension: Secondary | ICD-10-CM | POA: Insufficient documentation

## 2013-07-09 DIAGNOSIS — Z7982 Long term (current) use of aspirin: Secondary | ICD-10-CM | POA: Insufficient documentation

## 2013-07-09 DIAGNOSIS — E119 Type 2 diabetes mellitus without complications: Secondary | ICD-10-CM | POA: Insufficient documentation

## 2013-07-09 DIAGNOSIS — F17200 Nicotine dependence, unspecified, uncomplicated: Secondary | ICD-10-CM | POA: Insufficient documentation

## 2013-07-09 LAB — CBC WITH DIFFERENTIAL/PLATELET
Basophils Absolute: 0.1 10*3/uL (ref 0.0–0.1)
Basophils Relative: 1 % (ref 0–1)
Eosinophils Relative: 3 % (ref 0–5)
Hemoglobin: 16 g/dL (ref 13.0–17.0)
Lymphs Abs: 4.3 10*3/uL — ABNORMAL HIGH (ref 0.7–4.0)
MCH: 30.4 pg (ref 26.0–34.0)
MCHC: 34.5 g/dL (ref 30.0–36.0)
MCV: 88.2 fL (ref 78.0–100.0)
Monocytes Relative: 6 % (ref 3–12)
Neutro Abs: 4.8 10*3/uL (ref 1.7–7.7)
Neutrophils Relative %: 47 % (ref 43–77)
Platelets: 276 10*3/uL (ref 150–400)
RDW: 12.6 % (ref 11.5–15.5)

## 2013-07-09 LAB — URINALYSIS, ROUTINE W REFLEX MICROSCOPIC
Bilirubin Urine: NEGATIVE
Glucose, UA: NEGATIVE mg/dL
Hgb urine dipstick: NEGATIVE
Leukocytes, UA: NEGATIVE
Nitrite: NEGATIVE
Specific Gravity, Urine: 1.02 (ref 1.005–1.030)
pH: 5.5 (ref 5.0–8.0)

## 2013-07-09 LAB — ETHANOL: Alcohol, Ethyl (B): 95 mg/dL — ABNORMAL HIGH (ref 0–11)

## 2013-07-09 LAB — COMPREHENSIVE METABOLIC PANEL
ALT: 34 U/L (ref 0–53)
AST: 21 U/L (ref 0–37)
CO2: 25 mEq/L (ref 19–32)
Calcium: 9.7 mg/dL (ref 8.4–10.5)
GFR calc Af Amer: >90 mL/min (ref >90)
GFR calc non Af Amer: >90 mL/min (ref >90)
Glucose, Bld: 148 mg/dL — ABNORMAL HIGH (ref 70–99)
Sodium: 139 mEq/L (ref 135–145)
Total Bilirubin: 0.2 mg/dL — ABNORMAL LOW (ref 0.3–1.2)

## 2013-07-09 LAB — RAPID URINE DRUG SCREEN, HOSP PERFORMED
Benzodiazepines: POSITIVE — AB
Cocaine: NOT DETECTED
Opiates: NOT DETECTED
Tetrahydrocannabinol: NOT DETECTED

## 2013-07-09 LAB — LACTIC ACID, PLASMA: Lactic Acid, Venous: 2.4 mmol/L — ABNORMAL HIGH (ref 0.5–2.2)

## 2013-07-09 MED ORDER — GLIPIZIDE 10 MG PO TABS: 10.0000 mg | ORAL_TABLET | Freq: Two times a day (BID) | ORAL | Status: DC

## 2013-07-09 MED ORDER — OXYCODONE HCL 5 MG PO TABS: 5.0000 mg | ORAL_TABLET | ORAL | Status: DC | PRN

## 2013-07-09 MED ORDER — ONDANSETRON HCL 4 MG/2ML IJ SOLN
4.0000 mg | Freq: Once | INTRAMUSCULAR | Status: AC
  Administered 2013-07-09: 4 mg via INTRAVENOUS
  Filled 2013-07-09: qty 2

## 2013-07-09 MED ORDER — METFORMIN HCL 500 MG PO TABS: 500.0000 mg | ORAL_TABLET | Freq: Two times a day (BID) | ORAL | Status: DC

## 2013-07-09 MED ORDER — HYDROMORPHONE HCL PF 1 MG/ML IJ SOLN
1.0000 mg | Freq: Once | INTRAMUSCULAR | Status: AC
  Administered 2013-07-09: 1 mg via INTRAVENOUS
  Filled 2013-07-09: qty 1

## 2013-07-09 NOTE — ED Notes (Signed)
Patient states that his head is about to" bust wide open".

## 2013-07-09 NOTE — ED Provider Notes (Signed)
CSN: [id number]     Arrival date & time 07/09/13  0129 History   First MD Initiated Contact with Patient 07/09/13 0139     Chief Complaint  Patient presents with  . Chest Pain  . Shortness of Breath   (Consider location/radiation/quality/duration/timing/severity/associated sxs/prior Treatment) Patient is a 47 y.o. male presenting with chest pain and shortness of breath. The history is provided by the patient and the spouse.  Chest Pain Associated symptoms: shortness of breath   Shortness of Breath Associated symptoms: chest pain   His wife states that she heard him choking in bed and went in to find him unresponsive and not breathing. EMS was called and she did do chest compressions. EMS arrived and the patient started to wake up. She estimates that it was about 5 or 10 minutes that he was unconscious. There was no incontinence but he did bite the inside of his lip. He is now complaining of hurting everywhere from his head to his feet. Pain is 10/10. He does smoke half pack of cigarettes a day. He states he drinks occasionally and does admit to having had 3 beers tonight. He denies illicit drug use. He is diabetic and states that he cannot afford his medications so is not taking it. He's never had an episode like this before. He has been seen in the ED for syncope but that is related to his getting lightheaded when he stands up.  Past Medical History  Diagnosis Date  . Back pain   . Diabetes mellitus   . Anxiety   . Depression   . Hypertension    Past Surgical History  Procedure Laterality Date  . Mandible surgery    . Foot surgery     No family history on file. History  Substance Use Topics  . Smoking status: Current Every Day Smoker -- 0.25 packs/day    Types: Cigarettes  . Smokeless tobacco: Not on file  . Alcohol Use: Yes    Review of Systems  Respiratory: Positive for shortness of breath.   Cardiovascular: Positive for chest pain.  All other systems reviewed and are  negative.    Allergies  Tylenol  Home Medications   Current Outpatient Rx  Name  Route  Sig  Dispense  Refill  . aspirin EC 81 MG tablet   Oral   Take 81 mg by mouth daily.           . diazepam (VALIUM) 5 MG tablet   Oral   Take 5 mg by mouth 4 (four) times daily as needed for anxiety.         Marland Kitchen glipiZIDE (GLUCOTROL) 10 MG tablet   Oral   Take 10 mg by mouth 2 (two) times daily before a meal.         . ibuprofen (ADVIL,MOTRIN) 200 MG tablet   Oral   Take 400 mg by mouth every 6 (six) hours as needed for pain.         Marland Kitchen ibuprofen (ADVIL,MOTRIN) 800 MG tablet   Oral   Take 1 tablet (800 mg total) by mouth 3 (three) times daily.   21 tablet   0   . metFORMIN (GLUCOPHAGE) 500 MG tablet   Oral   Take 500 mg by mouth 2 (two) times daily with a meal.           . traMADol (ULTRAM) 50 MG tablet   Oral   Take 1 tablet (50 mg total) by mouth every 6 (six) hours  as needed for pain.   10 tablet   0    BP 124/82  Pulse 80  Temp(Src) 97.6 F (36.4 C) (Oral)  Resp 20  Ht 5\' 9"  (1.753 m)  Wt 193 lb (87.544 kg)  BMI 28.49 kg/m2  SpO2 97% Physical Exam  Nursing note and vitals reviewed.  47 year old male, resting comfortably and in no acute distress. Vital signs are normal. Oxygen saturation is 97%, which is normal. Head is normocephalic and atraumatic. PERRLA, EOMI. Oropharynx is clear. Neck is nontender and supple without adenopathy or JVD. Back is nontender and there is no CVA tenderness. Lungs are clear without rales, wheezes, or rhonchi. Chest is nontender. Heart has regular rate and rhythm without murmur. Abdomen is soft, flat, nontender without masses or hepatosplenomegaly and peristalsis is normoactive. Extremities have no cyanosis or edema, full range of motion is present. Skin is warm and dry without rash. Neurologic: Mental status is normal, cranial nerves are intact, there are no motor or sensory deficits.  ED Course  Procedures (including  critical care time) Labs Review Results for orders placed during the hospital encounter of 07/09/13  COMPREHENSIVE METABOLIC PANEL      Result Value Range   Sodium 139  135 - 145 mEq/L   Potassium 4.6  3.5 - 5.1 mEq/L   Chloride 101  96 - 112 mEq/L   CO2 25  19 - 32 mEq/L   Glucose, Bld 148 (*) 70 - 99 mg/dL   BUN 7  6 - 23 mg/dL   Creatinine, Ser 4.09  0.50 - 1.35 mg/dL   Calcium 9.7  8.4 - 81.1 mg/dL   Total Protein 7.7  6.0 - 8.3 g/dL   Albumin 3.9  3.5 - 5.2 g/dL   AST 21  0 - 37 U/L   ALT 34  0 - 53 U/L   Alkaline Phosphatase 87  39 - 117 U/L   Total Bilirubin 0.2 (*) 0.3 - 1.2 mg/dL   GFR calc non Af Amer >90  >90 mL/min   GFR calc Af Amer >90  >90 mL/min  CBC WITH DIFFERENTIAL      Result Value Range   WBC 10.0  4.0 - 10.5 K/uL   RBC 5.26  4.22 - 5.81 MIL/uL   Hemoglobin 16.0  13.0 - 17.0 g/dL   HCT 91.4  78.2 - 95.6 %   MCV 88.2  78.0 - 100.0 fL   MCH 30.4  26.0 - 34.0 pg   MCHC 34.5  30.0 - 36.0 g/dL   RDW 21.3  08.6 - 57.8 %   Platelets 276  150 - 400 K/uL   Neutrophils Relative % 47  43 - 77 %   Neutro Abs 4.8  1.7 - 7.7 K/uL   Lymphocytes Relative 43  12 - 46 %   Lymphs Abs 4.3 (*) 0.7 - 4.0 K/uL   Monocytes Relative 6  3 - 12 %   Monocytes Absolute 0.6  0.1 - 1.0 K/uL   Eosinophils Relative 3  0 - 5 %   Eosinophils Absolute 0.3  0.0 - 0.7 K/uL   Basophils Relative 1  0 - 1 %   Basophils Absolute 0.1  0.0 - 0.1 K/uL  URINE RAPID DRUG SCREEN (HOSP PERFORMED)      Result Value Range   Opiates NONE DETECTED  NONE DETECTED   Cocaine NONE DETECTED  NONE DETECTED   Benzodiazepines POSITIVE (*) NONE DETECTED   Amphetamines NONE DETECTED  NONE DETECTED  Tetrahydrocannabinol NONE DETECTED  NONE DETECTED   Barbiturates NONE DETECTED  NONE DETECTED  CK      Result Value Range   Total CK 80  7 - 232 U/L  URINALYSIS, ROUTINE W REFLEX MICROSCOPIC      Result Value Range   Color, Urine YELLOW  YELLOW   APPearance CLEAR  CLEAR   Specific Gravity, Urine 1.020   1.005 - 1.030   pH 5.5  5.0 - 8.0   Glucose, UA NEGATIVE  NEGATIVE mg/dL   Hgb urine dipstick NEGATIVE  NEGATIVE   Bilirubin Urine NEGATIVE  NEGATIVE   Ketones, ur NEGATIVE  NEGATIVE mg/dL   Protein, ur NEGATIVE  NEGATIVE mg/dL   Urobilinogen, UA 0.2  0.0 - 1.0 mg/dL   Nitrite NEGATIVE  NEGATIVE   Leukocytes, UA NEGATIVE  NEGATIVE  LACTIC ACID, PLASMA      Result Value Range   Lactic Acid, Venous 2.4 (*) 0.5 - 2.2 mmol/L  ETHANOL      Result Value Range   Alcohol, Ethyl (B) 95 (*) 0 - 11 mg/dL   Imaging Review Ct Head Wo Contrast  07/09/2013   CLINICAL DATA:  Possible seizure  EXAM: CT HEAD WITHOUT CONTRAST  TECHNIQUE: Contiguous axial images were obtained from the base of the skull through the vertex without intravenous contrast.  COMPARISON:  Prior radiograph from 07/01/2013  FINDINGS: No acute intracranial hemorrhage or infarct. No mass or midline shift. Ventricles are normal in size without evidence of hydrocephalus. Gray-white matter differentiation is preserved. No extra-axial fluid collection. Calvarium is intact. Orbits are normal.  Visualized paranasal sinuses and mastoid air cells are clear and well pneumatized.  IMPRESSION: Normal head CT with no acute intracranial process identified.   Electronically Signed   By: Rise Mu M.D.   On: 07/09/2013 02:48    EKG Interpretation     Ventricular Rate:  80 PR Interval:  160 QRS Duration: 74 QT Interval:  362 QTC Calculation: 417 R Axis:   46 Text Interpretation:  Normal sinus rhythm Normal ECG When compared with ECG of 28-Jun-2013 17:55, No significant change was found            MDM   1. Seizure    An episode where he sounded like he was choking followed by a period of unresponsiveness and then return to baseline mentation seems most consistent with seizure with postictal state. He has no history of seizures. Old records are reviewed and he has been seen in the ED for orthostatic syncope and noncardiac  chest pain. Seizure workup is initiated in the ED. Since this is his first seizure, he will not be started on anticonvulsants.  Workup is unremarkable. Drug screen is positive for benzodiazepines. Patient's records on [place] controlled substance reporting website was reviewed and he has had lifted with prescriptions for diazepam which account for her positive drug screen. He is discharged with a prescription for oxycodone for pain and is referred to neurology for outpatient EEG. I have given him new prescriptions for his glipizide and metformin and advised them that they are available for $4 a month at [facility] and [address].  Dione Booze, MD 07/09/13 [phone]

## 2013-07-09 NOTE — ED Notes (Signed)
Patient c/o chest pain.  Patient presents via [facility].  Wife called EMS, states patient was in cardiac arrest and performed CPR.  Patient A&O; skin w/d. Respirations even and unlabored; able to speak in complete sentences without difficulty.  Chest red.

## 2013-09-10 ENCOUNTER — Encounter (HOSPITAL_COMMUNITY): Payer: Self-pay | Admitting: Emergency Medicine

## 2013-09-10 DIAGNOSIS — R51 Headache: Secondary | ICD-10-CM | POA: Insufficient documentation

## 2013-09-10 DIAGNOSIS — R1011 Right upper quadrant pain: Secondary | ICD-10-CM | POA: Insufficient documentation

## 2013-09-10 DIAGNOSIS — Z7982 Long term (current) use of aspirin: Secondary | ICD-10-CM | POA: Insufficient documentation

## 2013-09-10 DIAGNOSIS — F17200 Nicotine dependence, unspecified, uncomplicated: Secondary | ICD-10-CM | POA: Insufficient documentation

## 2013-09-10 DIAGNOSIS — F329 Major depressive disorder, single episode, unspecified: Secondary | ICD-10-CM | POA: Insufficient documentation

## 2013-09-10 DIAGNOSIS — F411 Generalized anxiety disorder: Secondary | ICD-10-CM | POA: Insufficient documentation

## 2013-09-10 DIAGNOSIS — Z79899 Other long term (current) drug therapy: Secondary | ICD-10-CM | POA: Insufficient documentation

## 2013-09-10 DIAGNOSIS — E119 Type 2 diabetes mellitus without complications: Secondary | ICD-10-CM | POA: Insufficient documentation

## 2013-09-10 DIAGNOSIS — I10 Essential (primary) hypertension: Secondary | ICD-10-CM | POA: Insufficient documentation

## 2013-09-10 DIAGNOSIS — F3289 Other specified depressive episodes: Secondary | ICD-10-CM | POA: Insufficient documentation

## 2013-09-10 NOTE — ED Notes (Signed)
Pt reports migraine headache x 39month, bloody stools x 3 weeks, with increase in blood over the past week. Reports right side rib pain x 3 weeks, increasing in pain.

## 2013-09-11 ENCOUNTER — Emergency Department (HOSPITAL_COMMUNITY)
Admission: EM | Admit: 2013-09-11 | Discharge: 2013-09-11 | Disposition: A | Discharge status: Home / Self Care | Payer: Self-pay | Attending: Emergency Medicine | Admitting: Emergency Medicine

## 2013-09-11 ENCOUNTER — Inpatient Hospital Stay (HOSPITAL_COMMUNITY): Admit: 2013-09-11 | Payer: Self-pay

## 2013-09-11 DIAGNOSIS — R1011 Right upper quadrant pain: Secondary | ICD-10-CM

## 2013-09-11 DIAGNOSIS — R519 Headache, unspecified: Secondary | ICD-10-CM

## 2013-09-11 DIAGNOSIS — R51 Headache: Secondary | ICD-10-CM

## 2013-09-11 LAB — CBC WITH DIFFERENTIAL/PLATELET
BASOS PCT: 1 % (ref 0–1)
Basophils Absolute: 0.1 10*3/uL (ref 0.0–0.1)
EOS ABS: 0.2 10*3/uL (ref 0.0–0.7)
EOS PCT: 2 % (ref 0–5)
HCT: 46.1 % (ref 39.0–52.0)
Hemoglobin: 15.8 g/dL (ref 13.0–17.0)
LYMPHS ABS: 3.9 10*3/uL (ref 0.7–4.0)
Lymphocytes Relative: 40 % (ref 12–46)
MCH: 29.9 pg (ref 26.0–34.0)
MCHC: 34.3 g/dL (ref 30.0–36.0)
MCV: 87.3 fL (ref 78.0–100.0)
Monocytes Absolute: 0.8 10*3/uL (ref 0.1–1.0)
Monocytes Relative: 8 % (ref 3–12)
Neutro Abs: 4.8 10*3/uL (ref 1.7–7.7)
Neutrophils Relative %: 49 % (ref 43–77)
PLATELETS: 248 10*3/uL (ref 150–400)
RBC: 5.28 MIL/uL (ref 4.22–5.81)
RDW: 12.4 % (ref 11.5–15.5)
WBC: 9.8 10*3/uL (ref 4.0–10.5)

## 2013-09-11 LAB — HEPATIC FUNCTION PANEL
ALBUMIN: 3.9 g/dL (ref 3.5–5.2)
ALT: 39 U/L (ref 0–53)
AST: 26 U/L (ref 0–37)
Alkaline Phosphatase: 100 U/L (ref 39–117)
BILIRUBIN DIRECT: 0.2 mg/dL (ref 0.0–0.3)
BILIRUBIN TOTAL: 0.3 mg/dL (ref 0.3–1.2)
Indirect Bilirubin: 0.1 mg/dL — ABNORMAL LOW (ref 0.3–0.9)
Total Protein: 7.8 g/dL (ref 6.0–8.3)

## 2013-09-11 LAB — BASIC METABOLIC PANEL
BUN: 10 mg/dL (ref 6–23)
CALCIUM: 9.2 mg/dL (ref 8.4–10.5)
CO2: 21 mEq/L (ref 19–32)
Chloride: 104 mEq/L (ref 96–112)
Creatinine, Ser: 0.78 mg/dL (ref 0.50–1.35)
GFR calc Af Amer: >90 mL/min (ref >90)
Glucose, Bld: 193 mg/dL — ABNORMAL HIGH (ref 70–99)
Potassium: 4 mEq/L (ref 3.7–5.3)
SODIUM: 139 meq/L (ref 137–147)

## 2013-09-11 LAB — OCCULT BLOOD, POC DEVICE: Fecal Occult Bld: NEGATIVE

## 2013-09-11 LAB — LIPASE, BLOOD: Lipase: 43 U/L (ref 11–59)

## 2013-09-11 MED ORDER — METOCLOPRAMIDE HCL 5 MG/ML IJ SOLN
10.0000 mg | Freq: Once | INTRAMUSCULAR | Status: AC
  Administered 2013-09-11: 10 mg via INTRAVENOUS
  Filled 2013-09-11: qty 2

## 2013-09-11 MED ORDER — HYDROMORPHONE HCL PF 1 MG/ML IJ SOLN
1.0000 mg | Freq: Once | INTRAMUSCULAR | Status: AC
  Administered 2013-09-11: 1 mg via INTRAVENOUS
  Filled 2013-09-11: qty 1

## 2013-09-11 MED ORDER — DIPHENHYDRAMINE HCL 50 MG/ML IJ SOLN
25.0000 mg | Freq: Once | INTRAMUSCULAR | Status: AC
  Administered 2013-09-11: 25 mg via INTRAVENOUS
  Filled 2013-09-11: qty 1

## 2013-09-11 MED ORDER — SODIUM CHLORIDE 0.9 % IV BOLUS (SEPSIS)
1000.0000 mL | Freq: Once | INTRAVENOUS | Status: AC
Start: 2013-09-11 — End: 2013-09-11
  Administered 2013-09-11: 1000 mL via INTRAVENOUS

## 2013-09-11 MED ORDER — OXYCODONE HCL 5 MG PO TABS: 5.0000 mg | ORAL_TABLET | Freq: Four times a day (QID) | ORAL | Status: DC | PRN

## 2013-09-11 NOTE — ED Provider Notes (Signed)
CSN: [id number]     Arrival date & time 09/10/13  2158 History   First MD Initiated Contact with Patient 09/11/13 [phone]     Chief Complaint  Patient presents with  . Headache   (Consider location/radiation/quality/duration/timing/severity/associated sxs/prior Treatment) HPI This is a 48 year old male who has had a headache for the past 3 weeks. The headache is located in the temples bilaterally. The pain worsened yesterday evening and he decided it was time to have it evaluated. He has had nausea with it but no vomiting. Nothing has made the pain better. Is not having photophobia with it. There is no focal neurologic deficit. He states it is similar to previous headaches. He had unremarkable CT of the head in October.  He is also complaining of right upper quadrant abdominal pain just under his right lower rib cage. This is then present for about a week and is constant. It does not change with eating. It is worse with movement or palpation. He also states he's noted blood in his stool for about 3 weeks. It has not been gross motor clots but blood mixed with stool.  Past Medical History  Diagnosis Date  . Back pain   . Diabetes mellitus   . Anxiety   . Depression   . Hypertension    Past Surgical History  Procedure Laterality Date  . Mandible surgery    . Foot surgery     No family history on file. History  Substance Use Topics  . Smoking status: Current Every Day Smoker -- 0.25 packs/day    Types: Cigarettes  . Smokeless tobacco: Not on file  . Alcohol Use: Yes    Review of Systems  All other systems reviewed and are negative.    Allergies  Tylenol and Ultram  Home Medications   Current Outpatient Rx  Name  Route  Sig  Dispense  Refill  . aspirin EC 81 MG tablet   Oral   Take 81 mg by mouth daily.           . diazepam (VALIUM) 5 MG tablet   Oral   Take 5 mg by mouth 4 (four) times daily as needed for anxiety.         Marland Kitchen glipiZIDE (GLUCOTROL) 10 MG tablet    Oral   Take 10 mg by mouth 2 (two) times daily before a meal.         . glipiZIDE (GLUCOTROL) 10 MG tablet   Oral   Take 1 tablet (10 mg total) by mouth 2 (two) times daily before a meal.   60 tablet   0   . ibuprofen (ADVIL,MOTRIN) 200 MG tablet   Oral   Take 400 mg by mouth every 6 (six) hours as needed for pain.         Marland Kitchen ibuprofen (ADVIL,MOTRIN) 800 MG tablet   Oral   Take 1 tablet (800 mg total) by mouth 3 (three) times daily.   21 tablet   0   . metFORMIN (GLUCOPHAGE) 500 MG tablet   Oral   Take 500 mg by mouth 2 (two) times daily with a meal.           . metFORMIN (GLUCOPHAGE) 500 MG tablet   Oral   Take 1 tablet (500 mg total) by mouth 2 (two) times daily with a meal.   60 tablet   0   . oxyCODONE (OXY IR/ROXICODONE) 5 MG immediate release tablet   Oral   Take 1 tablet (5  mg total) by mouth every 4 (four) hours as needed for pain.   15 tablet   0   . traMADol (ULTRAM) 50 MG tablet   Oral   Take 1 tablet (50 mg total) by mouth every 6 (six) hours as needed for pain.   10 tablet   0    BP 129/93  Pulse 94  Temp(Src) 98 F (36.7 C) (Oral)  Resp 22  Ht 5\' 9"  (1.753 m)  Wt 200 lb (90.719 kg)  BMI 29.52 kg/m2  SpO2 97%  Physical Exam General: Well-developed, well-nourished male in no acute distress; appearance consistent with age of record HENT: normocephalic; atraumatic Eyes: pupils equal, round and reactive to light; extraocular muscles intact Neck: supple Heart: regular rate and rhythm; no murmurs, rubs or gallops Lungs: clear to auscultation bilaterally Abdomen: soft; nondistended; right upper quadrant tenderness with positive Murphy sign; no masses or hepatosplenomegaly; bowel sounds present Rectal: normal sphincter tone; stool on examining glove brown and heme-negative Extremities: No deformity; full range of motion; pulses normal Neurologic: Awake, alert and oriented; motor function intact in all extremities and symmetric; no facial  droop Skin: Warm and dry Psychiatric: Normal mood and affect    ED Course  Procedures (including critical care time)     MDM   Nursing notes and vitals signs, including pulse oximetry, reviewed.  Summary of this visit's results, reviewed by myself:  Labs:  Results for orders placed during the hospital encounter of 09/11/13 (from the past 24 hour(s))  CBC WITH DIFFERENTIAL     Status: None   Collection Time    09/10/13 11:59 PM      Result Value Range   WBC 9.8  4.0 - 10.5 K/uL   RBC 5.28  4.22 - 5.81 MIL/uL   Hemoglobin 15.8  13.0 - 17.0 g/dL   HCT 46.1  39.0 - 52.0 %   MCV 87.3  78.0 - 100.0 fL   MCH 29.9  26.0 - 34.0 pg   MCHC 34.3  30.0 - 36.0 g/dL   RDW 12.4  11.5 - 15.5 %   Platelets 248  150 - 400 K/uL   Neutrophils Relative % 49  43 - 77 %   Neutro Abs 4.8  1.7 - 7.7 K/uL   Lymphocytes Relative 40  12 - 46 %   Lymphs Abs 3.9  0.7 - 4.0 K/uL   Monocytes Relative 8  3 - 12 %   Monocytes Absolute 0.8  0.1 - 1.0 K/uL   Eosinophils Relative 2  0 - 5 %   Eosinophils Absolute 0.2  0.0 - 0.7 K/uL   Basophils Relative 1  0 - 1 %   Basophils Absolute 0.1  0.0 - 0.1 K/uL  BASIC METABOLIC PANEL     Status: Abnormal   Collection Time    09/10/13 11:59 PM      Result Value Range   Sodium 139  137 - 147 mEq/L   Potassium 4.0  3.7 - 5.3 mEq/L   Chloride 104  96 - 112 mEq/L   CO2 21  19 - 32 mEq/L   Glucose, Bld 193 (*) 70 - 99 mg/dL   BUN 10  6 - 23 mg/dL   Creatinine, Ser 0.78  0.50 - 1.35 mg/dL   Calcium 9.2  8.4 - 10.5 mg/dL   GFR calc non Af Amer >90  >90 mL/min   GFR calc Af Amer >90  >90 mL/min  HEPATIC FUNCTION PANEL     Status:  Abnormal   Collection Time    09/10/13 11:59 PM      Result Value Range   Total Protein 7.8  6.0 - 8.3 g/dL   Albumin 3.9  3.5 - 5.2 g/dL   AST 26  0 - 37 U/L   ALT 39  0 - 53 U/L   Alkaline Phosphatase 100  39 - 117 U/L   Total Bilirubin 0.3  0.3 - 1.2 mg/dL   Bilirubin, Direct 0.2  0.0 - 0.3 mg/dL   Indirect Bilirubin 0.1  (*) 0.3 - 0.9 mg/dL  LIPASE, BLOOD     Status: None   Collection Time    09/10/13 11:59 PM      Result Value Range   Lipase 43  11 - 59 U/L   3:56 AM Headache significantly improved after IV medications. There was no evidence of rectal bleeding and exam. We'll schedule an outpatient ultrasound to evaluate his gallbladder.     Wynetta Fines, MD 09/11/13 [phone]

## 2013-09-14 ENCOUNTER — Emergency Department (HOSPITAL_COMMUNITY)
Admission: EM | Admit: 2013-09-14 | Discharge: 2013-09-14 | Discharge status: Against Medical Advice | Payer: Self-pay | Attending: Emergency Medicine | Admitting: Emergency Medicine

## 2013-09-14 ENCOUNTER — Encounter (HOSPITAL_COMMUNITY): Payer: Self-pay | Admitting: Emergency Medicine

## 2013-09-14 DIAGNOSIS — F17200 Nicotine dependence, unspecified, uncomplicated: Secondary | ICD-10-CM | POA: Insufficient documentation

## 2013-09-14 DIAGNOSIS — R109 Unspecified abdominal pain: Secondary | ICD-10-CM | POA: Insufficient documentation

## 2013-09-14 DIAGNOSIS — I10 Essential (primary) hypertension: Secondary | ICD-10-CM | POA: Insufficient documentation

## 2013-09-14 DIAGNOSIS — Z760 Encounter for issue of repeat prescription: Secondary | ICD-10-CM | POA: Insufficient documentation

## 2013-09-14 DIAGNOSIS — E119 Type 2 diabetes mellitus without complications: Secondary | ICD-10-CM | POA: Insufficient documentation

## 2013-09-14 LAB — CBC WITH DIFFERENTIAL/PLATELET
Basophils Absolute: 0.1 10*3/uL (ref 0.0–0.1)
Basophils Relative: 1 % (ref 0–1)
Eosinophils Absolute: 0.1 10*3/uL (ref 0.0–0.7)
Eosinophils Relative: 1 % (ref 0–5)
HCT: 51 % (ref 39.0–52.0)
HEMOGLOBIN: 17.9 g/dL — AB (ref 13.0–17.0)
LYMPHS ABS: 2.5 10*3/uL (ref 0.7–4.0)
LYMPHS PCT: 22 % (ref 12–46)
MCH: 30.9 pg (ref 26.0–34.0)
MCHC: 35.1 g/dL (ref 30.0–36.0)
MCV: 87.9 fL (ref 78.0–100.0)
MONOS PCT: 7 % (ref 3–12)
Monocytes Absolute: 0.8 10*3/uL (ref 0.1–1.0)
NEUTROS PCT: 70 % (ref 43–77)
Neutro Abs: 7.7 10*3/uL (ref 1.7–7.7)
PLATELETS: 277 10*3/uL (ref 150–400)
RBC: 5.8 MIL/uL (ref 4.22–5.81)
RDW: 12.6 % (ref 11.5–15.5)
WBC: 11.1 10*3/uL — AB (ref 4.0–10.5)

## 2013-09-14 LAB — BASIC METABOLIC PANEL
BUN: 11 mg/dL (ref 6–23)
CO2: 25 mEq/L (ref 19–32)
Calcium: 10.2 mg/dL (ref 8.4–10.5)
Chloride: 99 mEq/L (ref 96–112)
Creatinine, Ser: 0.83 mg/dL (ref 0.50–1.35)
GFR calc Af Amer: >90 mL/min (ref >90)
GLUCOSE: 176 mg/dL — AB (ref 70–99)
POTASSIUM: 4.3 meq/L (ref 3.7–5.3)
Sodium: 138 mEq/L (ref 137–147)

## 2013-09-14 LAB — GLUCOSE, CAPILLARY: Glucose-Capillary: 177 mg/dL — ABNORMAL HIGH (ref 70–99)

## 2013-09-14 NOTE — ED Notes (Signed)
Pt reports being sick for 3 weeks w/ what he thinks is his gallbladder. Was seen in the ed on Friday, was scheduled for an ultrasound but did not come. Now is out of meds for pain "they only gave me a little bit"

## 2014-04-28 ENCOUNTER — Emergency Department (HOSPITAL_COMMUNITY): Payer: Self-pay

## 2014-04-28 ENCOUNTER — Encounter (HOSPITAL_COMMUNITY): Payer: Self-pay | Admitting: Emergency Medicine

## 2014-04-28 ENCOUNTER — Emergency Department (HOSPITAL_COMMUNITY)
Admission: EM | Admit: 2014-04-28 | Discharge: 2014-04-28 | Disposition: A | Discharge status: Home / Self Care | Payer: Self-pay | Attending: Emergency Medicine | Admitting: Emergency Medicine

## 2014-04-28 DIAGNOSIS — M79671 Pain in right foot: Secondary | ICD-10-CM

## 2014-04-28 DIAGNOSIS — F411 Generalized anxiety disorder: Secondary | ICD-10-CM | POA: Insufficient documentation

## 2014-04-28 DIAGNOSIS — M25579 Pain in unspecified ankle and joints of unspecified foot: Secondary | ICD-10-CM | POA: Insufficient documentation

## 2014-04-28 DIAGNOSIS — F329 Major depressive disorder, single episode, unspecified: Secondary | ICD-10-CM | POA: Insufficient documentation

## 2014-04-28 DIAGNOSIS — E119 Type 2 diabetes mellitus without complications: Secondary | ICD-10-CM | POA: Insufficient documentation

## 2014-04-28 DIAGNOSIS — F3289 Other specified depressive episodes: Secondary | ICD-10-CM | POA: Insufficient documentation

## 2014-04-28 DIAGNOSIS — Z79899 Other long term (current) drug therapy: Secondary | ICD-10-CM | POA: Insufficient documentation

## 2014-04-28 DIAGNOSIS — I10 Essential (primary) hypertension: Secondary | ICD-10-CM | POA: Insufficient documentation

## 2014-04-28 DIAGNOSIS — Z9889 Other specified postprocedural states: Secondary | ICD-10-CM | POA: Insufficient documentation

## 2014-04-28 DIAGNOSIS — F17200 Nicotine dependence, unspecified, uncomplicated: Secondary | ICD-10-CM | POA: Insufficient documentation

## 2014-04-28 DIAGNOSIS — Z7982 Long term (current) use of aspirin: Secondary | ICD-10-CM | POA: Insufficient documentation

## 2014-04-28 MED ORDER — HYDROMORPHONE HCL PF 1 MG/ML IJ SOLN
1.0000 mg | Freq: Once | INTRAMUSCULAR | Status: AC
  Administered 2014-04-28: 1 mg via INTRAMUSCULAR
  Filled 2014-04-28: qty 1

## 2014-04-28 MED ORDER — IBUPROFEN 800 MG PO TABS: 800.0000 mg | ORAL_TABLET | Freq: Three times a day (TID) | ORAL | Status: DC

## 2014-04-28 MED ORDER — OXYCODONE HCL 5 MG PO TABS
5.0000 mg | ORAL_TABLET | Freq: Once | ORAL | Status: AC
  Administered 2014-04-28: 5 mg via ORAL
  Filled 2014-04-28: qty 1

## 2014-04-28 MED ORDER — OXYCODONE HCL 5 MG PO TABS: 5.0000 mg | ORAL_TABLET | ORAL | Status: DC | PRN

## 2014-04-28 NOTE — ED Notes (Signed)
Pt reports pain to right foot for several days, states he has had surgery in the past and has pins and screws, denies recent new injury

## 2014-04-28 NOTE — Discharge Instructions (Signed)
Heel Spur °A heel spur is a hook of bone that can form on the calcaneus (the heel bone and the largest bone of the foot). Heel spurs are often associated with plantar fasciitis and usually come in people who have had the problem for an extended period of time. The cause of the relationship is unknown. The pain associated with them is thought to be caused by an inflammation (soreness and redness) of the plantar fascia rather than the spur itself. The plantar fascia is a thick fibrous like tissue that runs from the calcaneus (heel bone) to the ball of the foot. This strong, tight tissue helps maintain the arch of your foot. It helps distribute the weight across your foot as you walk or run. Stresses placed on the plantar fascia can be tremendous. When it is inflamed normal activities become painful. Pain is worse in the morning after sleeping. After sleeping the plantar fascia is tight. The first movements stretch the fascia and this causes pain. As the tendon loosens, the pain usually gets better. It often returns with too much standing or walking.  °About 70% of patients with plantar fasciitis have a heel spur. About half of people without foot pain also have heel spurs. °DIAGNOSIS  °The diagnosis of a heel spur is made by X-ray. The X-ray shows a hook of bone protruding from the bottom of the calcaneus at the point where the plantar fascia is attached to the heel bone.  °TREATMENT °· It is necessary to find out what is causing the stretching of the plantar fascia. If the cause is over-pronation (flat feet), orthotics and proper foot ware may help. °· Stretching exercises, losing weight, wearing shoes that have a cushioned heel that absorbs shock, and elevating the heel with the use of a heel cradle, heel cup, or orthotics may all help. Heel cradles and heel cups provide extra comfort and cushion to the heel, and reduce the amount of shock to the sore area. °AVOIDING THE PAIN OF PLANTAR FASCIITIS AND HEEL  SPURS °· Consult a sports medicine professional before beginning a new exercise program. °· Walking programs offer a good workout. There is a lower chance of overuse injuries common to the runners. There is less impact and less jarring of the joints. °· Begin all new exercise programs slowly. If problems or pains develop, decrease the amount of time or distance until you are at a comfortable level. °· Wear good shoes and replace them regularly. °· Stretch your foot and the heel cords at the back of the ankle (Achilles tendons) both before and after exercise. °· Run or exercise on even surfaces that are not hard. For example, asphalt is better than pavement. °· Do not run barefoot on hard surfaces. °· If using a treadmill, vary the incline. °· Do not continue to workout if you have foot or joint problems. Seek professional help if they do not improve. °HOME CARE INSTRUCTIONS  °· Avoid activities that cause you pain until you recover. °· Use ice or cold packs to the problem or painful areas after working out. °· Only take over-the-counter or prescription medicines for pain, discomfort, or fever as directed by your caregiver. °· Soft shoe inserts or athletic shoes with air or gel sole cushions may be helpful. °· If problems continue or become more severe, consult a sports medicine caregiver. Cortisone is a potent anti-inflammatory medication that may be injected into the painful area. You can discuss this treatment with your caregiver. °MAKE SURE YOU:  °·   Understand these instructions.  Will watch your condition.  Will get help right away if you are not doing well or get worse. Document Released: 10/02/2005 Document Revised: 11/18/2011 Document Reviewed: 10/27/2013 [employer] Patient Information 2015 [employer]. This information is not intended to replace advice given to you by your health care provider. Make sure you discuss any questions you have with your health care provider.  

## 2014-04-28 NOTE — ED Provider Notes (Signed)
CSN: [id number]     Arrival date & time 04/28/14  4098 History   First MD Initiated Contact with Patient 04/28/14 0359     Chief Complaint  Patient presents with  . Foot Pain     (Consider location/radiation/quality/duration/timing/severity/associated sxs/prior Treatment) HPI Comments: Patient reports shooting pain and right heel and ankle for the past 3 days. He denies any injury or fall. He says he had Achilles tendon repair surgery in 2011 and has heel spurs. He denies any weakness, numbness or tingling. No fever or vomiting. No chest pain or shortness of breath. He has been taking ibuprofen without relief. He is unable to sleep tonight because of the pain.  The history is provided by the patient.    Past Medical History  Diagnosis Date  . Back pain   . Diabetes mellitus   . Anxiety   . Depression   . Hypertension    Past Surgical History  Procedure Laterality Date  . Mandible surgery    . Foot surgery     No family history on file. History  Substance Use Topics  . Smoking status: Current Every Day Smoker -- 0.25 packs/day    Types: Cigarettes  . Smokeless tobacco: Not on file  . Alcohol Use: Yes     Comment: occ    Review of Systems  Constitutional: Negative for activity change and appetite change.  Respiratory: Negative for chest tightness and shortness of breath.   Gastrointestinal: Negative for nausea, vomiting and abdominal pain.  Genitourinary: Negative for dysuria and hematuria.  Musculoskeletal: Positive for arthralgias and myalgias. Negative for back pain.  Skin: Negative for rash.  A complete 10 system review of systems was obtained and all systems are negative except as noted in the HPI and PMH.      Allergies  Benadryl; Tylenol; and Ultram  Home Medications   Prior to Admission medications   Medication Sig Start Date End Date Taking? Authorizing Provider  aspirin EC 81 MG tablet Take 81 mg by mouth daily.     Yes Historical Provider, MD   glipiZIDE (GLUCOTROL) 10 MG tablet Take 1 tablet (10 mg total) by mouth 2 (two) times daily before a meal. 11/91/47  Yes Delora Fuel, MD  ibuprofen (ADVIL,MOTRIN) 200 MG tablet Take 400 mg by mouth every 6 (six) hours as needed for pain.   Yes Historical Provider, MD  ibuprofen (ADVIL,MOTRIN) 800 MG tablet Take 1 tablet (800 mg total) by mouth 3 (three) times daily. 07/01/13  Yes Teressa Lower, MD  metFORMIN (GLUCOPHAGE) 500 MG tablet Take 500 mg by mouth 2 (two) times daily with a meal.     Yes Historical Provider, MD  metFORMIN (GLUCOPHAGE) 500 MG tablet Take 1 tablet (500 mg total) by mouth 2 (two) times daily with a meal. 82/95/62  Yes Delora Fuel, MD  diazepam (VALIUM) 5 MG tablet Take 5 mg by mouth 4 (four) times daily as needed for anxiety.    Historical Provider, MD  glipiZIDE (GLUCOTROL) 10 MG tablet Take 10 mg by mouth 2 (two) times daily before a meal.    Historical Provider, MD  ibuprofen (ADVIL,MOTRIN) 800 MG tablet Take 1 tablet (800 mg total) by mouth 3 (three) times daily. 04/28/14   Ezequiel Essex, MD  oxyCODONE (OXY IR/ROXICODONE) 5 MG immediate release tablet Take 1 tablet (5 mg total) by mouth every 6 (six) hours as needed (for pain). 09/11/13   John L Molpus, MD  oxyCODONE (ROXICODONE) 5 MG immediate release tablet Take 1  tablet (5 mg total) by mouth every 4 (four) hours as needed for severe pain. 04/28/14   Ezequiel Essex, MD  traMADol (ULTRAM) 50 MG tablet Take 1 tablet (50 mg total) by mouth every 6 (six) hours as needed for pain. 07/01/13   Teressa Lower, MD   BP 153/110  Pulse 88  Temp(Src) 98.1 F (36.7 C) (Oral)  Resp 20  Ht 5\' 9"  (1.753 m)  Wt 185 lb (83.915 kg)  BMI 27.31 kg/m2  SpO2 98% Physical Exam  Nursing note and vitals reviewed. Constitutional: He is oriented to person, place, and time. He appears well-developed and well-nourished. No distress.  uncomfortable  HENT:  Head: Normocephalic and atraumatic.  Mouth/Throat: Oropharynx is clear and moist. No  oropharyngeal exudate.  Eyes: Conjunctivae and EOM are normal. Pupils are equal, round, and reactive to light.  Neck: Normal range of motion. Neck supple.  No meningismus.  Cardiovascular: Normal rate, regular rhythm, normal heart sounds and intact distal pulses.   No murmur heard. Pulmonary/Chest: Effort normal and breath sounds normal. No respiratory distress.  Abdominal: Soft. There is no tenderness. There is no rebound and no guarding.  Musculoskeletal: Normal range of motion. He exhibits tenderness. He exhibits no edema.  Well-healed incision to the right Achilles area. Tenderness to palpation of right heel and right Achilles. Ankle flexion and extension intact. Intact DP and PT pulses. No open wounds, fluctuance, or erythema No calf tenderness or swelling Achilles tendon intact  Neurological: He is alert and oriented to person, place, and time. No cranial nerve deficit. He exhibits normal muscle tone. Coordination normal.  No ataxia on finger to nose bilaterally. No pronator drift. 5/5 strength throughout. CN 2-12 intact. Negative Romberg. Equal grip strength. Sensation intact. Gait is normal.   Skin: Skin is warm.  Psychiatric: He has a normal mood and affect. His behavior is normal.    ED Course  Procedures (including critical care time) Labs Review Labs Reviewed - No data to display  Imaging Review Dg Ankle Complete Right  04/28/2014   CLINICAL DATA:  Plantar heel pain. , no injury.  EXAM: RIGHT ANKLE - COMPLETE 3+ VIEW; RIGHT FOOT COMPLETE - 3+ VIEW  COMPARISON:  Right ankle radiographs June 30, 2013  FINDINGS: No fracture deformity nor dislocation. The ankle mortise appears congruent and the tibiofibular syndesmosis intact. Severe first metatarsophalangeal osteoarthrosis. Mild second and third metatarsophalangeal osteoarthrosis. Moderate plantar calcaneal spur. Calcification along the course of the plantar fascia. Suture anchor in the calcaneus, with corticated calcifications  within the Achilles insertion, unchanged. No destructive bony lesions. Soft tissue planes are non-suspicious. No subcutaneous gas or radiopaque foreign bodies.  IMPRESSION: No acute fracture deformity or dislocation.  Plantar calcification can be associated with plantar fasciitis.  Severe first metatarsophalangeal osteoarthrosis, at least mild second and third MTP osteoarthrosis.   Electronically Signed   By: Elon Alas   On: 04/28/2014 04:43   Dg Foot Complete Right  04/28/2014   CLINICAL DATA:  Plantar heel pain. , no injury.  EXAM: RIGHT ANKLE - COMPLETE 3+ VIEW; RIGHT FOOT COMPLETE - 3+ VIEW  COMPARISON:  Right ankle radiographs June 30, 2013  FINDINGS: No fracture deformity nor dislocation. The ankle mortise appears congruent and the tibiofibular syndesmosis intact. Severe first metatarsophalangeal osteoarthrosis. Mild second and third metatarsophalangeal osteoarthrosis. Moderate plantar calcaneal spur. Calcification along the course of the plantar fascia. Suture anchor in the calcaneus, with corticated calcifications within the Achilles insertion, unchanged. No destructive bony lesions. Soft tissue planes are non-suspicious.  No subcutaneous gas or radiopaque foreign bodies.  IMPRESSION: No acute fracture deformity or dislocation.  Plantar calcification can be associated with plantar fasciitis.  Severe first metatarsophalangeal osteoarthrosis, at least mild second and third MTP osteoarthrosis.   Electronically Signed   By: Elon Alas   On: 04/28/2014 04:43     EKG Interpretation None      MDM   Final diagnoses:  Heel pain, right   Right ankle pain with history of Achilles tendon repair. No focal weakness, numbness or tingling. No recent injury. No fever  X-ray show no hardware fracture or bone fracture. Plantar calcification and heel spur seen. Severe osteoarthritis of first MTP joint  Pain improved in the ED. Suspect the patient's pain is secondary to heel spur and plantar  fasciitis. No evidence of infection. We'll treat pain medication anti-inflammatories. Followup with orthopedics.  He has his own crutches.  Ezequiel Essex, MD 04/28/14 [phone]

## 2014-10-05 ENCOUNTER — Emergency Department (HOSPITAL_COMMUNITY): Payer: Self-pay

## 2014-10-05 ENCOUNTER — Encounter (HOSPITAL_COMMUNITY): Payer: Self-pay

## 2014-10-05 ENCOUNTER — Emergency Department (HOSPITAL_COMMUNITY)
Admission: EM | Admit: 2014-10-05 | Discharge: 2014-10-05 | Disposition: A | Discharge status: Home / Self Care | Payer: Self-pay | Attending: Emergency Medicine | Admitting: Emergency Medicine

## 2014-10-05 DIAGNOSIS — W228XXA Striking against or struck by other objects, initial encounter: Secondary | ICD-10-CM | POA: Insufficient documentation

## 2014-10-05 DIAGNOSIS — S43402A Unspecified sprain of left shoulder joint, initial encounter: Secondary | ICD-10-CM | POA: Insufficient documentation

## 2014-10-05 DIAGNOSIS — S239XXA Sprain of unspecified parts of thorax, initial encounter: Secondary | ICD-10-CM

## 2014-10-05 DIAGNOSIS — Z720 Tobacco use: Secondary | ICD-10-CM | POA: Insufficient documentation

## 2014-10-05 DIAGNOSIS — R112 Nausea with vomiting, unspecified: Secondary | ICD-10-CM

## 2014-10-05 DIAGNOSIS — Y9289 Other specified places as the place of occurrence of the external cause: Secondary | ICD-10-CM | POA: Insufficient documentation

## 2014-10-05 DIAGNOSIS — F329 Major depressive disorder, single episode, unspecified: Secondary | ICD-10-CM | POA: Insufficient documentation

## 2014-10-05 DIAGNOSIS — Y998 Other external cause status: Secondary | ICD-10-CM | POA: Insufficient documentation

## 2014-10-05 DIAGNOSIS — R52 Pain, unspecified: Secondary | ICD-10-CM

## 2014-10-05 DIAGNOSIS — Z791 Long term (current) use of non-steroidal anti-inflammatories (NSAID): Secondary | ICD-10-CM | POA: Insufficient documentation

## 2014-10-05 DIAGNOSIS — Y9389 Activity, other specified: Secondary | ICD-10-CM | POA: Insufficient documentation

## 2014-10-05 DIAGNOSIS — R55 Syncope and collapse: Secondary | ICD-10-CM | POA: Insufficient documentation

## 2014-10-05 DIAGNOSIS — E1165 Type 2 diabetes mellitus with hyperglycemia: Secondary | ICD-10-CM | POA: Insufficient documentation

## 2014-10-05 DIAGNOSIS — I10 Essential (primary) hypertension: Secondary | ICD-10-CM | POA: Insufficient documentation

## 2014-10-05 DIAGNOSIS — S139XXA Sprain of joints and ligaments of unspecified parts of neck, initial encounter: Secondary | ICD-10-CM

## 2014-10-05 DIAGNOSIS — Z8673 Personal history of transient ischemic attack (TIA), and cerebral infarction without residual deficits: Secondary | ICD-10-CM | POA: Insufficient documentation

## 2014-10-05 DIAGNOSIS — F419 Anxiety disorder, unspecified: Secondary | ICD-10-CM | POA: Insufficient documentation

## 2014-10-05 DIAGNOSIS — R739 Hyperglycemia, unspecified: Secondary | ICD-10-CM

## 2014-10-05 DIAGNOSIS — G4489 Other headache syndrome: Secondary | ICD-10-CM | POA: Insufficient documentation

## 2014-10-05 DIAGNOSIS — Z7982 Long term (current) use of aspirin: Secondary | ICD-10-CM | POA: Insufficient documentation

## 2014-10-05 DIAGNOSIS — S134XXA Sprain of ligaments of cervical spine, initial encounter: Secondary | ICD-10-CM | POA: Insufficient documentation

## 2014-10-05 DIAGNOSIS — Z79899 Other long term (current) drug therapy: Secondary | ICD-10-CM | POA: Insufficient documentation

## 2014-10-05 DIAGNOSIS — S339XXA Sprain of unspecified parts of lumbar spine and pelvis, initial encounter: Secondary | ICD-10-CM | POA: Insufficient documentation

## 2014-10-05 HISTORY — DX: Cerebral infarction, unspecified: I63.9

## 2014-10-05 LAB — COMPREHENSIVE METABOLIC PANEL
ALT: 42 U/L (ref 0–53)
ANION GAP: 7 (ref 5–15)
AST: 33 U/L (ref 0–37)
Albumin: 4 g/dL (ref 3.5–5.2)
Alkaline Phosphatase: 99 U/L (ref 39–117)
BILIRUBIN TOTAL: 1.2 mg/dL (ref 0.3–1.2)
BUN: <5 mg/dL — ABNORMAL LOW (ref 6–23)
CHLORIDE: 107 mmol/L (ref 96–112)
CO2: 22 mmol/L (ref 19–32)
Calcium: 9.2 mg/dL (ref 8.4–10.5)
Creatinine, Ser: 0.69 mg/dL (ref 0.50–1.35)
GFR calc Af Amer: >90 mL/min (ref >90)
Glucose, Bld: 222 mg/dL — ABNORMAL HIGH (ref 70–99)
POTASSIUM: 3.8 mmol/L (ref 3.5–5.1)
Sodium: 136 mmol/L (ref 135–145)
TOTAL PROTEIN: 7.7 g/dL (ref 6.0–8.3)

## 2014-10-05 LAB — CBC WITH DIFFERENTIAL/PLATELET
BASOS ABS: 0.1 10*3/uL (ref 0.0–0.1)
Basophils Relative: 1 % (ref 0–1)
Eosinophils Absolute: 0.1 10*3/uL (ref 0.0–0.7)
Eosinophils Relative: 1 % (ref 0–5)
HEMATOCRIT: 46.5 % (ref 39.0–52.0)
HEMOGLOBIN: 16.1 g/dL (ref 13.0–17.0)
Lymphocytes Relative: 26 % (ref 12–46)
Lymphs Abs: 2.6 10*3/uL (ref 0.7–4.0)
MCH: 29.7 pg (ref 26.0–34.0)
MCHC: 34.6 g/dL (ref 30.0–36.0)
MCV: 85.6 fL (ref 78.0–100.0)
MONOS PCT: 6 % (ref 3–12)
Monocytes Absolute: 0.6 10*3/uL (ref 0.1–1.0)
NEUTROS ABS: 6.8 10*3/uL (ref 1.7–7.7)
NEUTROS PCT: 66 % (ref 43–77)
Platelets: 341 10*3/uL (ref 150–400)
RBC: 5.43 MIL/uL (ref 4.22–5.81)
RDW: 14.1 % (ref 11.5–15.5)
WBC: 10.1 10*3/uL (ref 4.0–10.5)

## 2014-10-05 LAB — TROPONIN I

## 2014-10-05 LAB — RAPID URINE DRUG SCREEN, HOSP PERFORMED
Amphetamines: NOT DETECTED
BENZODIAZEPINES: POSITIVE — AB
Barbiturates: NOT DETECTED
COCAINE: NOT DETECTED
Opiates: NOT DETECTED
Tetrahydrocannabinol: POSITIVE — AB

## 2014-10-05 LAB — ETHANOL: Alcohol, Ethyl (B): <5 mg/dL (ref 0–9)

## 2014-10-05 MED ORDER — KETOROLAC TROMETHAMINE 30 MG/ML IJ SOLN
30.0000 mg | Freq: Once | INTRAMUSCULAR | Status: AC
  Administered 2014-10-05: 30 mg via INTRAVENOUS
  Filled 2014-10-05: qty 1

## 2014-10-05 MED ORDER — METOCLOPRAMIDE HCL 5 MG/ML IJ SOLN
10.0000 mg | Freq: Once | INTRAMUSCULAR | Status: AC
  Administered 2014-10-05: 10 mg via INTRAVENOUS
  Filled 2014-10-05: qty 2

## 2014-10-05 MED ORDER — METFORMIN HCL 500 MG PO TABS: 500.0000 mg | ORAL_TABLET | Freq: Two times a day (BID) | ORAL | Status: DC

## 2014-10-05 MED ORDER — LORAZEPAM 2 MG/ML IJ SOLN
1.0000 mg | Freq: Once | INTRAMUSCULAR | Status: AC
  Administered 2014-10-05: 1 mg via INTRAVENOUS
  Filled 2014-10-05: qty 1

## 2014-10-05 NOTE — Discharge Instructions (Signed)

## 2014-10-05 NOTE — ED Notes (Signed)
Patient reports he has not taken any medications including DM and blood thinners in "years"

## 2014-10-05 NOTE — ED Notes (Signed)
Cleared from back board by Dr. Christy Gentles, C-Collar remains in place.

## 2014-10-05 NOTE — ED Provider Notes (Signed)
CSN: [id number]     Arrival date & time 10/05/14  1339 History  This chart was scribed for Sharyon Cable, MD by Lowella Petties, ED Scribe. The patient was seen in room APA11/APA11. Patient's care was started at 1:50 PM.   Chief Complaint  Patient presents with  . Loss of Consciousness    The history is provided by the patient. The history is limited by the condition of the patient. No language interpreter was used.    Level 5 Caveat: Altered Mental Status  HPI Comments: Danny [name] is a 49 y.o. male who with a history of CVA who was brought by EMS to the Emergency Department after a syncopal episode earlier today. He reports an associated constant, severe, headache. He reports associated nausea and 3 episodes of vomiting for the past 3 days. He reports severe blurred vision for the past 3 days but admits to having blurred vision "all the time". He reports hematemesis and abdominal pain. He reports lower back pain. He reports a history of seizures, but he is not sure if he had one today. He reports pain in his lower back, left shoulder, and right arm. He reports burning pain in his legs bilaterally. He denies drinking alcohol on a daily basis, and he states that he has not taken Xanax "for a while."   Past Medical History  Diagnosis Date  . Back pain   . Diabetes mellitus   . Anxiety   . Depression   . Hypertension   . CVA (cerebral infarction)    Past Surgical History  Procedure Laterality Date  . Mandible surgery    . Foot surgery     History reviewed. No pertinent family history. History  Substance Use Topics  . Smoking status: Current Every Day Smoker -- 0.50 packs/day    Types: Cigarettes  . Smokeless tobacco: Not on file  . Alcohol Use: Yes     Comment: occ    Review of Systems  Unable to perform ROS: Mental status change   Level 5 Caveat: Altered Mental Status   Allergies  Benadryl; Tylenol; and Ultram  Home Medications   Prior to Admission medications    Medication Sig Start Date End Date Taking? Authorizing Provider  aspirin EC 81 MG tablet Take 81 mg by mouth daily.      Historical Provider, MD  diazepam (VALIUM) 5 MG tablet Take 5 mg by mouth 4 (four) times daily as needed for anxiety.    Historical Provider, MD  glipiZIDE (GLUCOTROL) 10 MG tablet Take 10 mg by mouth 2 (two) times daily before a meal.    Historical Provider, MD  glipiZIDE (GLUCOTROL) 10 MG tablet Take 1 tablet (10 mg total) by mouth 2 (two) times daily before a meal. 69/62/95   Delora Fuel, MD  ibuprofen (ADVIL,MOTRIN) 200 MG tablet Take 400 mg by mouth every 6 (six) hours as needed for pain.    Historical Provider, MD  ibuprofen (ADVIL,MOTRIN) 800 MG tablet Take 1 tablet (800 mg total) by mouth 3 (three) times daily. 07/01/13   Teressa Lower, MD  ibuprofen (ADVIL,MOTRIN) 800 MG tablet Take 1 tablet (800 mg total) by mouth 3 (three) times daily. 04/28/14   Ezequiel Essex, MD  metFORMIN (GLUCOPHAGE) 500 MG tablet Take 500 mg by mouth 2 (two) times daily with a meal.      Historical Provider, MD  metFORMIN (GLUCOPHAGE) 500 MG tablet Take 1 tablet (500 mg total) by mouth 2 (two) times daily with a  meal. 22/29/79   Delora Fuel, MD  oxyCODONE (OXY IR/ROXICODONE) 5 MG immediate release tablet Take 1 tablet (5 mg total) by mouth every 6 (six) hours as needed (for pain). 09/11/13   John L Molpus, MD  oxyCODONE (ROXICODONE) 5 MG immediate release tablet Take 1 tablet (5 mg total) by mouth every 4 (four) hours as needed for severe pain. 04/28/14   Ezequiel Essex, MD  traMADol (ULTRAM) 50 MG tablet Take 1 tablet (50 mg total) by mouth every 6 (six) hours as needed for pain. 07/01/13   Teressa Lower, MD   BP 141/86 mmHg  Pulse 110  Temp(Src) 97.7 F (36.5 C) (Oral)  Resp 16  Ht 5\' 9"  (1.753 m)  Wt 200 lb (90.719 kg)  BMI 29.52 kg/m2  SpO2 97% Physical Exam  Nursing note and vitals reviewed.    CONSTITUTIONAL: Well developed/well nourished HEAD: Normocephalic/atraumatic EYES:  EOMI/PERRL ENMT: Mucous membranes moist, poor dentition NECK: supple no meningeal signs SPINE/BACK: diffuse cervical, thoracic, and lumbar tenderness, No bruising/crepitance/stepoffs noted to spine Patient maintained in spinal precautions/logroll utilized CV: S1/S2 noted, no murmurs/rubs/gallops noted LUNGS: Lungs are clear to auscultation bilaterally, no apparent distress ABDOMEN: soft, nontender, no rebound or guarding, bowel sounds noted throughout abdomen GU:no cva tenderness NEURO: Pt is awake/alert/, moves all extremitiesx4.  No facial droop.  Pt is confused, he closes eyes frequently and has to be re-directed back to questioning, he is tremulous  EXTREMITIES: pulses normal/equal, full ROM, tenderness to left shoulder but no deformity, All other extremities/joints palpated/ranged and nontender SKIN: warm, color normal PSYCH: confused and anxious   ED Course  Procedures  DIAGNOSTIC STUDIES: Oxygen Saturation is 97% on room air, normal by my interpretation.    COORDINATION OF CARE: 1:58 PM-Discussed treatment plan which includes EKG with pt at bedside and pt agreed to plan.  Pt appears anxious, very tremulous.  He has no focal weakness.  However he appears confused and agitated.  Imaging/labs have been ordered.  He denies ETOH/BDZ abuse recently. 4:50 PM Pt more calm and more alert He reports continued HA but no focal weakness noted.  No facial droop and no arm/leg drift He can ambulate unassisted Family at bedside now - reports he was sitting in chair and told his family he passed out but he was found awake/alert He reports he has had HA for weeks and similar to prior episodes (doubt acute neurologic event) He reports today he had vomiting episodes and that is when he had syncope I doubt cardiac dysrhythmia 5:17 PM Pt improved He is resting comfortably I advised need for f/u and he was given [facility] Also will refill metformin due to untreated diabetes  Labs  Review Labs Reviewed  COMPREHENSIVE METABOLIC PANEL - Abnormal; Notable for the following:    Glucose, Bld 222 (*)    BUN <5 (*)    All other components within normal limits  URINE RAPID DRUG SCREEN (HOSP PERFORMED) - Abnormal; Notable for the following:    Benzodiazepines POSITIVE (*)    Tetrahydrocannabinol POSITIVE (*)    All other components within normal limits  CBC WITH DIFFERENTIAL/PLATELET  TROPONIN I  ETHANOL    Imaging Review Dg Chest 1 View  10/05/2014   CLINICAL DATA:  Syncope  EXAM: CHEST - 1 VIEW  COMPARISON:  08/25/2012  FINDINGS: Cardiomediastinal silhouette is stable. No acute infiltrate or pleural effusion. No pulmonary edema. Mild degenerative changes thoracic spine.  IMPRESSION: No active disease.   Electronically Signed   By:  Lahoma Crocker M.D.   On: 10/05/2014 15:06   Dg Thoracic Spine W/swimmers  10/05/2014   CLINICAL DATA:  Syncope.  EXAM: THORACIC SPINE - 2 VIEW + SWIMMERS  COMPARISON:  August 25, 2012.  FINDINGS: There is no evidence of thoracic spine fracture. Alignment is normal. No pneumothorax or pleural effusion is noted. No other significant bone abnormalities are identified.  IMPRESSION: No acute cardiopulmonary abnormality seen.   Electronically Signed   By: Sabino Dick M.D.   On: 10/05/2014 15:05   Dg Lumbar Spine Complete  10/05/2014   CLINICAL DATA:  49 year old male with syncopal episode today. Lumbar region back pain. Vomiting for 3 days. Burning pain in both lower extremities. Initial encounter.  EXAM: LUMBAR SPINE - COMPLETE 4+ VIEW  COMPARISON:  Lumbar MRI 09/04/2011, and earlier.  FINDINGS: Mild dextro convex lumbar scoliosis is new and may be positional. On the lateral view Stable vertebral height and alignment. T12 benign vertebral body hemangioma re- identified. Stable disc spaces. Mild endplate spurring. No pars fracture. Sacral ala and SI joints within normal limits.  IMPRESSION: No acute osseous abnormality identified in the lumbar spine.    Electronically Signed   By: Lars Pinks M.D.   On: 10/05/2014 15:05   Ct Head Wo Contrast  10/05/2014   CLINICAL DATA:  N/V x 3 days; syncopal episode today; fall hit posterior head; hx cva 2005; daibetes; headache  EXAM: CT HEAD WITHOUT CONTRAST  CT CERVICAL SPINE WITHOUT CONTRAST  TECHNIQUE: Multidetector CT imaging of the head and cervical spine was performed following the standard protocol without intravenous contrast. Multiplanar CT image reconstructions of the cervical spine were also generated.  COMPARISON:  07/09/2013  FINDINGS: CT HEAD FINDINGS  There is no evidence of mass effect, midline shift or extra-axial fluid collections. There is no evidence of a space-occupying lesion or intracranial hemorrhage. There is no evidence of a cortical-based area of acute infarction.  The ventricles and sulci are appropriate for the patient's age. The basal cisterns are patent.  Visualized portions of the orbits are unremarkable. There is bilateral ethmoid sinus mucosal thickening. The mastoid sinuses are clear. There is intracranial cerebrovascular atherosclerotic disease.  The osseous structures are unremarkable.  CT CERVICAL SPINE FINDINGS  The alignment is anatomic. The vertebral body heights are maintained. There is no acute fracture. There is no static listhesis. The prevertebral soft tissues are normal. The intraspinal soft tissues are not fully imaged on this examination due to poor soft tissue contrast, but there is no gross soft tissue abnormality.  The disc spaces are maintained. Central disc protrusion at C3-4. Mild bilateral facet arthropathy at C4-5 with left uncovertebral degenerative change and mild left foraminal encroachment. Eccentric right disc bulge at C5-6 and bilateral facet arthropathy.  The visualized portions of the lung apices demonstrate no focal abnormality.  There is bilateral carotid artery atherosclerosis.  IMPRESSION: 1.  No acute intracranial pathology.  2.  No acute osseous injury of  the cervical spine.   Electronically Signed   By: Kathreen Devoid   On: 10/05/2014 15:19   Ct Cervical Spine Wo Contrast  10/05/2014   CLINICAL DATA:  N/V x 3 days; syncopal episode today; fall hit posterior head; hx cva 2005; daibetes; headache  EXAM: CT HEAD WITHOUT CONTRAST  CT CERVICAL SPINE WITHOUT CONTRAST  TECHNIQUE: Multidetector CT imaging of the head and cervical spine was performed following the standard protocol without intravenous contrast. Multiplanar CT image reconstructions of the cervical spine were also generated.  COMPARISON:  07/09/2013  FINDINGS: CT HEAD FINDINGS  There is no evidence of mass effect, midline shift or extra-axial fluid collections. There is no evidence of a space-occupying lesion or intracranial hemorrhage. There is no evidence of a cortical-based area of acute infarction.  The ventricles and sulci are appropriate for the patient's age. The basal cisterns are patent.  Visualized portions of the orbits are unremarkable. There is bilateral ethmoid sinus mucosal thickening. The mastoid sinuses are clear. There is intracranial cerebrovascular atherosclerotic disease.  The osseous structures are unremarkable.  CT CERVICAL SPINE FINDINGS  The alignment is anatomic. The vertebral body heights are maintained. There is no acute fracture. There is no static listhesis. The prevertebral soft tissues are normal. The intraspinal soft tissues are not fully imaged on this examination due to poor soft tissue contrast, but there is no gross soft tissue abnormality.  The disc spaces are maintained. Central disc protrusion at C3-4. Mild bilateral facet arthropathy at C4-5 with left uncovertebral degenerative change and mild left foraminal encroachment. Eccentric right disc bulge at C5-6 and bilateral facet arthropathy.  The visualized portions of the lung apices demonstrate no focal abnormality.  There is bilateral carotid artery atherosclerosis.  IMPRESSION: 1.  No acute intracranial pathology.  2.   No acute osseous injury of the cervical spine.   Electronically Signed   By: Kathreen Devoid   On: 10/05/2014 15:19   Dg Shoulder Left  10/05/2014   CLINICAL DATA:  Syncopal episode earlier today.  Left shoulder pain.  EXAM: LEFT SHOULDER - 2+ VIEW  COMPARISON:  None.  FINDINGS: Two views of the left shoulder are negative for fracture or dislocation. Moderate AC arthritis is present. No bone lesion or bony destruction evident.  IMPRESSION: Negative for acute fracture or dislocation   Electronically Signed   By: Andreas Newport M.D.   On: 10/05/2014 15:05     EKG Interpretation   Date/Time:  Wednesday October 05 2014 13:54:52 EST Ventricular Rate:  104 PR Interval:  150 QRS Duration: 90 QT Interval:  328 QTC Calculation: 431 R Axis:   67 Text Interpretation:  Sinus tachycardia Right atrial enlargement  Anteroseptal infarct, age indeterminate Baseline wander in lead(s) V3  artifact noted rate is faster compred to prior Confirmed by Christy Gentles  MD,  Elenore Rota ([id number]) on 10/05/2014 2:00:32 PM     Medications  metoCLOPramide (REGLAN) injection 10 mg (not administered)  LORazepam (ATIVAN) injection 1 mg (1 mg Intravenous Given 10/05/14 1501)  metoCLOPramide (REGLAN) injection 10 mg (10 mg Intravenous Given 10/05/14 1622)  ketorolac (TORADOL) 30 MG/ML injection 30 mg (30 mg Intravenous Given 10/05/14 1622)    MDM   Final diagnoses:  Pain  Other headache syndrome  Acute cervical sprain, initial encounter  Back sprain, initial encounter  Syncope, unspecified syncope type  Nausea and vomiting, vomiting of unspecified type  Hyperglycemia  Shoulder sprain, left, initial encounter    Nursing notes including past medical history and social history reviewed and considered in documentation xrays/imaging reviewed by myself and considered during evaluation Labs/vital reviewed myself and considered during evaluation Previous records reviewed and considered Narcotic database reviewed and considered  in decision making   I personally performed the services described in this documentation, which was scribed in my presence. The recorded information has been reviewed and is accurate.      Sharyon Cable, MD 10/05/14 [phone]

## 2014-10-05 NOTE — ED Notes (Signed)
Pt c/o of being "sick and throwing up around 3 times per day for last 2-3 days". Today patient had a syncope episode today, passed out hit head, pt having blurred vision which is "normal" for him due to DM, but is now worse, also c/o of headache, and "anxiety which I have ran out of medication for. Patient states he remembers getting up to go vomit and then doesn't remember anything until waking up".

## 2014-10-05 NOTE — ED Notes (Signed)
Pt PMH of CVA in 2005, with residual left sided weakness, patient states the weakness remains at baseline today.

## 2014-10-07 NOTE — Care Management Note (Signed)
Stacie Acres, RN did a call-back phone call to pt and said he could not afford his metformin, Rx here by EDP. Called pt, but only able to speak a short time because he was running out of minutes on phone. He said he would appreciate a voucher to get meds, and uses [facility]. He said he could not follow up with a Dr for refills due to no transportation. Suggested he use [facility] which only costs $3 each time and uses one of the low income clinics. He says he has handouts CM has given him previously with this information.  Phone went out at that time. Voucher faxed to [facility], for pt, if he decides to get medications.

## 2015-01-08 ENCOUNTER — Emergency Department (HOSPITAL_COMMUNITY): Payer: Self-pay

## 2015-01-08 ENCOUNTER — Emergency Department (HOSPITAL_COMMUNITY)
Admission: EM | Admit: 2015-01-08 | Discharge: 2015-01-08 | Disposition: A | Discharge status: Home / Self Care | Payer: Self-pay | Attending: Emergency Medicine | Admitting: Emergency Medicine

## 2015-01-08 ENCOUNTER — Encounter (HOSPITAL_COMMUNITY): Payer: Self-pay | Admitting: Emergency Medicine

## 2015-01-08 DIAGNOSIS — I10 Essential (primary) hypertension: Secondary | ICD-10-CM | POA: Insufficient documentation

## 2015-01-08 DIAGNOSIS — R52 Pain, unspecified: Secondary | ICD-10-CM

## 2015-01-08 DIAGNOSIS — Z720 Tobacco use: Secondary | ICD-10-CM | POA: Insufficient documentation

## 2015-01-08 DIAGNOSIS — Z79899 Other long term (current) drug therapy: Secondary | ICD-10-CM | POA: Insufficient documentation

## 2015-01-08 DIAGNOSIS — Y998 Other external cause status: Secondary | ICD-10-CM | POA: Insufficient documentation

## 2015-01-08 DIAGNOSIS — W01198A Fall on same level from slipping, tripping and stumbling with subsequent striking against other object, initial encounter: Secondary | ICD-10-CM | POA: Insufficient documentation

## 2015-01-08 DIAGNOSIS — Y9301 Activity, walking, marching and hiking: Secondary | ICD-10-CM | POA: Insufficient documentation

## 2015-01-08 DIAGNOSIS — Z8673 Personal history of transient ischemic attack (TIA), and cerebral infarction without residual deficits: Secondary | ICD-10-CM | POA: Insufficient documentation

## 2015-01-08 DIAGNOSIS — E119 Type 2 diabetes mellitus without complications: Secondary | ICD-10-CM | POA: Insufficient documentation

## 2015-01-08 DIAGNOSIS — Z8659 Personal history of other mental and behavioral disorders: Secondary | ICD-10-CM | POA: Insufficient documentation

## 2015-01-08 DIAGNOSIS — Y9289 Other specified places as the place of occurrence of the external cause: Secondary | ICD-10-CM | POA: Insufficient documentation

## 2015-01-08 DIAGNOSIS — S2232XA Fracture of one rib, left side, initial encounter for closed fracture: Secondary | ICD-10-CM | POA: Insufficient documentation

## 2015-01-08 HISTORY — DX: Cerebral infarction, unspecified: I63.9

## 2015-01-08 MED ORDER — OXYCODONE HCL 5 MG PO TABS
5.0000 mg | ORAL_TABLET | Freq: Once | ORAL | Status: AC
  Administered 2015-01-08: 5 mg via ORAL

## 2015-01-08 MED ORDER — OXYCODONE HCL 5 MG PO TABS
ORAL_TABLET | ORAL | Status: AC
  Filled 2015-01-08: qty 1

## 2015-01-08 MED ORDER — OXYCODONE HCL 5 MG PO TABS: 5.0000 mg | ORAL_TABLET | ORAL | Status: DC | PRN

## 2015-01-08 NOTE — ED Notes (Signed)
Patient c/o left side rib pain after falling hitting ribs on wooden steps two days ago. Patient denies hitting head or LOC. Per patient feeling short of breath and "tastes blood in throat."

## 2015-01-08 NOTE — Discharge Instructions (Signed)
Oxycodone as prescribed as needed for pain.  All with your primary Dr. if not improving in the next week, and return to the ER if you develop difficulty breathing, high fever, productive cough, or other new and concerning symptoms.   Rib Fracture A rib fracture is a break or crack in one of the bones of the ribs. The ribs are a group of long, curved bones that wrap around your chest and attach to your spine. They protect your lungs and other organs in the chest cavity. A broken or cracked rib is often painful, but most do not cause other problems. Most rib fractures heal on their own over time. However, rib fractures can be more serious if multiple ribs are broken or if broken ribs move out of place and push against other structures. CAUSES   A direct blow to the chest. For example, this could happen during contact sports, a car accident, or a fall against a hard object.  Repetitive movements with high force, such as pitching a baseball or having severe coughing spells. SYMPTOMS   Pain when you breathe in or cough.  Pain when someone presses on the injured area. DIAGNOSIS  Your caregiver will perform a physical exam. Various imaging tests may be ordered to confirm the diagnosis and to look for related injuries. These tests may include a chest X-ray, computed tomography (CT), magnetic resonance imaging (MRI), or a bone scan. TREATMENT  Rib fractures usually heal on their own in 1-3 months. The longer healing period is often associated with a continued cough or other aggravating activities. During the healing period, pain control is very important. Medication is usually given to control pain. Hospitalization or surgery may be needed for more severe injuries, such as those in which multiple ribs are broken or the ribs have moved out of place.  HOME CARE INSTRUCTIONS   Avoid strenuous activity and any activities or movements that cause pain. Be careful during activities and avoid bumping the  injured rib.  Gradually increase activity as directed by your caregiver.  Only take over-the-counter or prescription medications as directed by your caregiver. Do not take other medications without asking your caregiver first.  Apply ice to the injured area for the first 1-2 days after you have been treated or as directed by your caregiver. Applying ice helps to reduce inflammation and pain.  Put ice in a plastic bag.  Place a towel between your skin and the bag.   Leave the ice on for 15-20 minutes at a time, every 2 hours while you are awake.  Perform deep breathing as directed by your caregiver. This will help prevent pneumonia, which is a common complication of a broken rib. Your caregiver may instruct you to:  Take deep breaths several times a day.  Try to cough several times a day, holding a pillow against the injured area.  Use a device called an incentive spirometer to practice deep breathing several times a day.  Drink enough fluids to keep your urine clear or pale yellow. This will help you avoid constipation.   Do not wear a rib belt or binder. These restrict breathing, which can lead to pneumonia.  SEEK IMMEDIATE MEDICAL CARE IF:   You have a fever.   You have difficulty breathing or shortness of breath.   You develop a continual cough, or you cough up thick or bloody sputum.  You feel sick to your stomach (nausea), throw up (vomit), or have abdominal pain.   You have  worsening pain not controlled with medications.  MAKE SURE YOU:  Understand these instructions.  Will watch your condition.  Will get help right away if you are not doing well or get worse. Document Released: 08/26/2005 Document Revised: 04/28/2013 Document Reviewed: 10/28/2012 [facility] Patient Information 2015 [place], [place]. This information is not intended to replace advice given to you by your health care provider. Make sure you discuss any questions you have with your health care  provider.

## 2015-01-08 NOTE — ED Provider Notes (Signed)
CSN: [id number]     Arrival date & time 01/08/15  1726 History   First MD Initiated Contact with Patient 01/08/15 [place]     Chief Complaint  Patient presents with  . Fall     (Consider location/radiation/quality/duration/timing/severity/associated sxs/prior Treatment) HPI Comments: Patient is a 49 year old male who presents for evaluation of left rib pain. He was walking out of his workplace 2 days ago when he fell on the stairs and landed on his left ribs. He is having pain with breathing and reports "tasting blood in his mouth".  Patient is a 49 y.o. male presenting with fall. The history is provided by the patient.  Fall This is a new problem. The current episode started 2 days ago. The problem occurs constantly. The problem has been gradually worsening. Associated symptoms include chest pain. Exacerbated by: Deep breath, movement, palpation. Nothing relieves the symptoms. He has tried nothing for the symptoms. The treatment provided no relief.    Past Medical History  Diagnosis Date  . Back pain   . Diabetes mellitus   . Anxiety   . Depression   . Hypertension   . CVA (cerebral infarction)   . Stroke    Past Surgical History  Procedure Laterality Date  . Mandible surgery    . Foot surgery     Family History  Problem Relation Age of Onset  . Stroke Father   . Stroke Sister    History  Substance Use Topics  . Smoking status: Current Every Day Smoker -- 0.50 packs/day for 20 years    Types: Cigarettes  . Smokeless tobacco: Never Used  . Alcohol Use: Yes     Comment: occ    Review of Systems  Cardiovascular: Positive for chest pain.  All other systems reviewed and are negative.     Allergies  Tylenol; Ultram; and Benadryl  Home Medications   Prior to Admission medications   Medication Sig Start Date End Date Taking? Authorizing Provider  Melatonin 5 MG CAPS Take 2 capsules by mouth at bedtime as needed (sleep).    Historical Provider, MD  metFORMIN  (GLUCOPHAGE) 500 MG tablet Take 1 tablet (500 mg total) by mouth 2 (two) times daily with a meal. 10/05/14   Ripley Fraise, MD   BP 119/78 mmHg  Pulse 98  Temp(Src) 98.3 F (36.8 C) (Oral)  Resp 16  Ht 5\' 10"  (1.778 m)  Wt 190 lb (86.183 kg)  BMI 27.26 kg/m2  SpO2 98% Physical Exam  Constitutional: He is oriented to person, place, and time. He appears well-developed and well-nourished. No distress.  HENT:  Head: Normocephalic and atraumatic.  Neck: Normal range of motion. Neck supple.  Cardiovascular: Normal rate, regular rhythm and normal heart sounds.   No murmur heard. Pulmonary/Chest: Effort normal and breath sounds normal. No respiratory distress. He has no wheezes. He has no rales. He exhibits tenderness.  There is tenderness to palpation over the left lateral lower ribs. There is no crepitus and breath sounds are equal.  Abdominal: Soft. Bowel sounds are normal. He exhibits no distension. There is no tenderness.  Musculoskeletal: Normal range of motion. He exhibits no edema.  Neurological: He is alert and oriented to person, place, and time.  Skin: Skin is warm and dry. He is not diaphoretic.  Nursing note and vitals reviewed.   ED Course  Procedures (including critical care time) Labs Review Labs Reviewed - No data to display  Imaging Review No results found.   EKG Interpretation None  MDM   Final diagnoses:  Pain    X-rays reveal a nondisplaced eighth rib fracture. He will be treated with pain medication and when necessary return.    Veryl Speak, MD 01/08/15 2004

## 2015-03-28 ENCOUNTER — Emergency Department (HOSPITAL_COMMUNITY)
Admission: EM | Admit: 2015-03-28 | Discharge: 2015-03-28 | Disposition: A | Discharge status: Home / Self Care | Payer: Self-pay | Attending: Emergency Medicine | Admitting: Emergency Medicine

## 2015-03-28 ENCOUNTER — Emergency Department (HOSPITAL_COMMUNITY): Payer: Self-pay

## 2015-03-28 ENCOUNTER — Encounter (HOSPITAL_COMMUNITY): Payer: Self-pay | Admitting: Emergency Medicine

## 2015-03-28 DIAGNOSIS — R740 Nonspecific elevation of levels of transaminase and lactic acid dehydrogenase [LDH]: Secondary | ICD-10-CM | POA: Insufficient documentation

## 2015-03-28 DIAGNOSIS — E119 Type 2 diabetes mellitus without complications: Secondary | ICD-10-CM | POA: Insufficient documentation

## 2015-03-28 DIAGNOSIS — R200 Anesthesia of skin: Secondary | ICD-10-CM | POA: Insufficient documentation

## 2015-03-28 DIAGNOSIS — R51 Headache: Secondary | ICD-10-CM | POA: Insufficient documentation

## 2015-03-28 DIAGNOSIS — Z8673 Personal history of transient ischemic attack (TIA), and cerebral infarction without residual deficits: Secondary | ICD-10-CM | POA: Insufficient documentation

## 2015-03-28 DIAGNOSIS — R519 Headache, unspecified: Secondary | ICD-10-CM

## 2015-03-28 DIAGNOSIS — Z720 Tobacco use: Secondary | ICD-10-CM | POA: Insufficient documentation

## 2015-03-28 DIAGNOSIS — F419 Anxiety disorder, unspecified: Secondary | ICD-10-CM | POA: Insufficient documentation

## 2015-03-28 DIAGNOSIS — F329 Major depressive disorder, single episode, unspecified: Secondary | ICD-10-CM | POA: Insufficient documentation

## 2015-03-28 DIAGNOSIS — R109 Unspecified abdominal pain: Secondary | ICD-10-CM

## 2015-03-28 DIAGNOSIS — R531 Weakness: Secondary | ICD-10-CM | POA: Insufficient documentation

## 2015-03-28 DIAGNOSIS — Z79899 Other long term (current) drug therapy: Secondary | ICD-10-CM | POA: Insufficient documentation

## 2015-03-28 DIAGNOSIS — H538 Other visual disturbances: Secondary | ICD-10-CM | POA: Insufficient documentation

## 2015-03-28 DIAGNOSIS — R7401 Elevation of levels of liver transaminase levels: Secondary | ICD-10-CM

## 2015-03-28 LAB — URINALYSIS, ROUTINE W REFLEX MICROSCOPIC
Bilirubin Urine: NEGATIVE
Glucose, UA: 500 mg/dL — AB
Hgb urine dipstick: NEGATIVE
Ketones, ur: NEGATIVE mg/dL
Leukocytes, UA: NEGATIVE
NITRITE: NEGATIVE
Protein, ur: NEGATIVE mg/dL
Specific Gravity, Urine: 1.02 (ref 1.005–1.030)
Urobilinogen, UA: 0.2 mg/dL (ref 0.0–1.0)
pH: 5.5 (ref 5.0–8.0)

## 2015-03-28 LAB — CBC WITH DIFFERENTIAL/PLATELET
BASOS ABS: 0.1 10*3/uL (ref 0.0–0.1)
Basophils Relative: 1 % (ref 0–1)
EOS PCT: 2 % (ref 0–5)
Eosinophils Absolute: 0.2 10*3/uL (ref 0.0–0.7)
HCT: 46.5 % (ref 39.0–52.0)
Hemoglobin: 16.1 g/dL (ref 13.0–17.0)
Lymphocytes Relative: 19 % (ref 12–46)
Lymphs Abs: 1.9 10*3/uL (ref 0.7–4.0)
MCH: 30.8 pg (ref 26.0–34.0)
MCHC: 34.6 g/dL (ref 30.0–36.0)
MCV: 88.9 fL (ref 78.0–100.0)
Monocytes Absolute: 0.5 10*3/uL (ref 0.1–1.0)
Monocytes Relative: 5 % (ref 3–12)
NEUTROS ABS: 7.3 10*3/uL (ref 1.7–7.7)
Neutrophils Relative %: 73 % (ref 43–77)
Platelets: 242 10*3/uL (ref 150–400)
RBC: 5.23 MIL/uL (ref 4.22–5.81)
RDW: 13.3 % (ref 11.5–15.5)
WBC: 9.9 10*3/uL (ref 4.0–10.5)

## 2015-03-28 LAB — COMPREHENSIVE METABOLIC PANEL
ALBUMIN: 3.7 g/dL (ref 3.5–5.0)
ALT: 262 U/L — AB (ref 17–63)
AST: 172 U/L — AB (ref 15–41)
Alkaline Phosphatase: 119 U/L (ref 38–126)
Anion gap: 10 (ref 5–15)
BUN: 10 mg/dL (ref 6–20)
CO2: 21 mmol/L — AB (ref 22–32)
CREATININE: 0.83 mg/dL (ref 0.61–1.24)
Calcium: 8.9 mg/dL (ref 8.9–10.3)
Chloride: 101 mmol/L (ref 101–111)
GFR calc Af Amer: >60 mL/min (ref >60)
GFR calc non Af Amer: >60 mL/min (ref >60)
Glucose, Bld: 265 mg/dL — ABNORMAL HIGH (ref 65–99)
Potassium: 4 mmol/L (ref 3.5–5.1)
Sodium: 132 mmol/L — ABNORMAL LOW (ref 135–145)
TOTAL PROTEIN: 7.8 g/dL (ref 6.5–8.1)
Total Bilirubin: 0.9 mg/dL (ref 0.3–1.2)

## 2015-03-28 LAB — CK: Total CK: 56 U/L (ref 49–397)

## 2015-03-28 MED ORDER — SODIUM CHLORIDE 0.9 % IV BOLUS (SEPSIS): 1000.0000 mL | Freq: Once | INTRAVENOUS | Status: DC

## 2015-03-28 MED ORDER — SODIUM CHLORIDE 0.9 % IV BOLUS (SEPSIS)
1000.0000 mL | Freq: Once | INTRAVENOUS | Status: AC
  Administered 2015-03-28 (×2): 1000 mL via INTRAVENOUS

## 2015-03-28 MED ORDER — OXYCODONE HCL 5 MG PO TABS: 5.0000 mg | ORAL_TABLET | ORAL | Status: DC | PRN

## 2015-03-28 MED ORDER — IBUPROFEN 800 MG PO TABS: 800.0000 mg | ORAL_TABLET | Freq: Three times a day (TID) | ORAL | Status: DC

## 2015-03-28 MED ORDER — SODIUM CHLORIDE 0.9 % IV BOLUS (SEPSIS)
1000.0000 mL | Freq: Once | INTRAVENOUS | Status: AC
  Administered 2015-03-28: 1000 mL via INTRAVENOUS

## 2015-03-28 MED ORDER — HYDROMORPHONE HCL 1 MG/ML IJ SOLN
1.0000 mg | Freq: Once | INTRAMUSCULAR | Status: AC
  Administered 2015-03-28: 1 mg via INTRAVENOUS
  Filled 2015-03-28: qty 1

## 2015-03-28 MED ORDER — METOCLOPRAMIDE HCL 5 MG/ML IJ SOLN
10.0000 mg | Freq: Once | INTRAMUSCULAR | Status: AC
  Administered 2015-03-28: 10 mg via INTRAVENOUS
  Filled 2015-03-28: qty 2

## 2015-03-28 MED ORDER — KETOROLAC TROMETHAMINE 30 MG/ML IJ SOLN
30.0000 mg | Freq: Once | INTRAMUSCULAR | Status: AC
  Administered 2015-03-28: 30 mg via INTRAVENOUS
  Filled 2015-03-28: qty 1

## 2015-03-28 NOTE — Discharge Instructions (Signed)

## 2015-03-28 NOTE — ED Provider Notes (Signed)
CSN: [id number]     Arrival date & time 03/28/15  1124 History  This chart was scribed for Danny Chapel, MD by Terressa Koyanagi, ED Scribe. This patient was seen in room APA15/APA15 and the patient's care was started at 12:14 PM.   Chief Complaint  Patient presents with  . Weakness  . Headache    since Sunday   Patient is a 49 y.o. male presenting with headaches. The history is provided by the patient. No language interpreter was used.  Headache Associated symptoms: numbness (tingling of left arm), photophobia and weakness   Associated symptoms: no fever    PCP: No PCP Per Patient HPI Comments: Danny [name] is a 49 y.o. male, who works outdoors as a Nature conservation officer, with PMHx noted below including HTN, stroke treated with thrombolytics (2005), DM, and daily tobacco use (0.5 ppd), who presents to the Emergency Department complaining of atrauamtic, gradual constant, constant, throbbing/pounding, 10/10, bitemporal and crown headache with associated photophobia and visual disturbance (blurred vision and seeing spots) onset 2 days ago. Pt reports taking ibuprofen at home without relief. Pt reports he worked outdoors all day before onset of Sx. Pt further complains of generalized weakness onset this morning. Pt defines weakness as having no energy and tingling of the left arm. Pt denies any numbness/tingling of BLE, RUE, speech difficulty, gait problems.   Past Medical History  Diagnosis Date  . Back pain   . Diabetes mellitus   . Anxiety   . Depression   . Hypertension   . CVA (cerebral infarction)   . Stroke    Past Surgical History  Procedure Laterality Date  . Mandible surgery    . Foot surgery     Family History  Problem Relation Age of Onset  . Stroke Father   . Stroke Sister    History  Substance Use Topics  . Smoking status: Current Every Day Smoker -- 0.50 packs/day for 20 years    Types: Cigarettes  . Smokeless tobacco: Never Used  . Alcohol Use: Yes     Comment:  occ    Review of Systems  Constitutional: Negative for fever.  Eyes: Positive for photophobia and visual disturbance.  Musculoskeletal: Negative for gait problem.  Neurological: Positive for weakness, numbness (tingling of left arm) and headaches. Negative for speech difficulty.  All other systems reviewed and are negative.  Allergies  Tylenol; Ultram; and Benadryl  Home Medications   Prior to Admission medications   Medication Sig Start Date End Date Taking? Authorizing Provider  Melatonin 5 MG CAPS Take 2 capsules by mouth at bedtime as needed (sleep).   Yes Historical Provider, MD  ibuprofen (ADVIL,MOTRIN) 800 MG tablet Take 1 tablet (800 mg total) by mouth 3 (three) times daily. 03/28/15   Danny Chapel, MD  metFORMIN (GLUCOPHAGE) 500 MG tablet Take 1 tablet (500 mg total) by mouth 2 (two) times daily with a meal. Patient not taking: Reported on 01/08/2015 10/05/14   Ripley Fraise, MD  oxyCODONE (ROXICODONE) 5 MG immediate release tablet Take 1 tablet (5 mg total) by mouth every 4 (four) hours as needed for severe pain. 03/28/15   Danny Chapel, MD   Triage Vitals: BP 185/96 mmHg  Pulse 97  Temp(Src) 98.3 F (36.8 C) (Oral)  Resp 18  Ht 6' (1.829 m)  Wt 190 lb (86.183 kg)  BMI 25.76 kg/m2  SpO2 100% Physical Exam  Constitutional: He appears well-developed and well-nourished. No distress.  HENT:  Head: Normocephalic and atraumatic.  Mouth/Throat:  Oropharynx is clear and moist. No oropharyngeal exudate.  Eyes: Conjunctivae and EOM are normal. Pupils are equal, round, and reactive to light. Right eye exhibits no discharge. Left eye exhibits no discharge. No scleral icterus.  Aniasocorea right greater than left.   Neck: Normal range of motion. Neck supple. No JVD present. No thyromegaly present.  Cardiovascular: Normal rate, regular rhythm, normal heart sounds and intact distal pulses.  Exam reveals no gallop and no friction rub.   No murmur heard. Pulmonary/Chest: Effort normal  and breath sounds normal. No respiratory distress. He has no wheezes. He has no rales.  Abdominal: Soft. Bowel sounds are normal. He exhibits no distension and no mass. There is no tenderness.  Musculoskeletal: Normal range of motion. He exhibits no edema or tenderness.  Soreness to muscles of bilateral legs and arms with compartments soft and joints supple.   Lymphadenopathy:    He has no cervical adenopathy.  Neurological: He is alert. Coordination normal.  Normal strength, normal sensation, normal finger-nose-finger, normal speech, cranial nerves III through XII are normal, memory is intact.   Skin: Skin is warm and dry. No rash noted. No erythema.  Psychiatric: He has a normal mood and affect. His behavior is normal.  Nursing note and vitals reviewed.   ED Course  Procedures (including critical care time) DIAGNOSTIC STUDIES: Oxygen Saturation is 100% , normal by my interpretation.    COORDINATION OF CARE: 12:24 PM-Discussed treatment plan which includes imaging with pt at bedside and pt agreed to plan.   Results for orders placed or performed during the hospital encounter of 03/28/15  CBC with Differential  Result Value Ref Range  WBC 9.9 4.0 - 10.5 K/uL  RBC 5.23 4.22 - 5.81 MIL/uL  Hemoglobin 16.1 13.0 - 17.0 g/dL  HCT 46.5 39.0 - 52.0 %  MCV 88.9 78.0 - 100.0 fL  MCH 30.8 26.0 - 34.0 pg  MCHC 34.6 30.0 - 36.0 g/dL  RDW 13.3 11.5 - 15.5 %  Platelets 242 150 - 400 K/uL  Neutrophils Relative % 73 43 - 77 %  Neutro Abs 7.3 1.7 - 7.7 K/uL  Lymphocytes Relative 19 12 - 46 %  Lymphs Abs 1.9 0.7 - 4.0 K/uL  Monocytes Relative 5 3 - 12 %   2 0 - 5 %  Eosinophils Absolute 0.2 0.0 - 0.7 K/uL  Basophils Relative 1 0 - 1 %  Basophils Absolute 0.1 0.0 - 0.1 K/uL  Comprehensive metabolic panel  Result Value Ref Range  Sodium 132 (L) 135 - 145 mmol/L  Potassium 4.0 3.5 - 5.1 mmol/L  Chloride 101 101 - 111 mmol/L  CO2 21 (L) 22 - 32 mmol/L  Glucose, Bld 265 (H) 65 - 99 mg/dL   BUN 10 6 - 20 mg/dL  Creatinine, Ser 0.83 0.61 - 1.24 mg/dL  Calcium 8.9 8.9 - 10.3 mg/dL  Total Protein 7.8 6.5 - 8.1 g/dL  Albumin 3.7 3.5 - 5.0 g/dL  AST 172 (H) 15 - 41 U/L  ALT 262 (H) 17 - 63 U/L  Alkaline Phosphatase 119 38 - 126 U/L  Total Bilirubin 0.9 0.3 - 1.2 mg/dL  GFR calc non Af Amer >60 >60 mL/min  GFR calc Af Amer >60 >60 mL/min  Anion gap 10 5 - 15  CK  Result Value Ref Range  Total CK 56 49 - 397 U/L   Ct Head Wo Contrast  03/28/2015   CLINICAL DATA:  Headaches since Sunday. No known injury. History of stroke in 2005.  EXAM: CT HEAD WITHOUT CONTRAST  TECHNIQUE: Contiguous axial images were obtained from the base of the skull through the vertex without intravenous contrast.  COMPARISON:  Head CT dated 10/05/2014.  FINDINGS: All areas of the brain demonstrate normal gray-white matter attenuation. There is no mass, hemorrhage, edema, or other evidence of acute parenchymal abnormality. Ventricles are normal in size and configuration. No extra-axial hemorrhage. No osseous abnormality seen. Visualized upper paranasal sinuses are clear.  IMPRESSION: Normal head CT.   Electronically Signed   By: Franki Cabot M.D.   On: 03/28/2015 13:45   US Abdomen Limited Ruq  03/28/2015   CLINICAL DATA:  Abdominal pain, elevated LFTs, history diabetes mellitus, hypertension, stroke, smoking  EXAM: US ABDOMEN LIMITED - RIGHT UPPER QUADRANT  COMPARISON:  CT abdomen and pelvis 09/04/2011  FINDINGS: Gallbladder:  Gallbladder appears distended but otherwise normal appearance. No gallbladder wall thickening, pericholecystic fluid or sonographic Murphy sign.  Common bile duct:  Diameter: 6 mm diameter, upper normal.  Liver:  Normal appearance. No intrahepatic biliary dilatation. Hepatopetal portal venous flow.  No RIGHT upper quadrant free fluid.  IMPRESSION: Gallbladder appears distended but is otherwise sonographically normal in appearance.  Upper normal CBD.  Normal appearing liver.   Electronically  Signed   By: Lavonia Dana M.D.   On: 03/28/2015 16:41      MDM   Final diagnoses:  Headache  Abdominal pain  Transaminitis    The patient has no abnormal vital signs, he has no abdominal tenderness, his renal function is normal, creatine kinase is normal, blood counts are normal. He does have some hyperglycemia, he is a known diabetic taking metformin. Of note the patient does have a transaminitis with AST and ALT elevation, there is no prior history of this, no prior history of renal disease. We'll obtain CT scan of the brain, give fluids and medications, recheck. The patient does not have any focal neurologic deficits.  Much improved after IV fluids and pain medications, labs, CT scan, urinalysis and right upper quadrant ultrasound revealed no specific findings. At this time the patient appears stable for discharge. He is in agreement with the plan.  Meds given in ED:  Medications  sodium chloride 0.9 % bolus 1,000 mL (not administered)  sodium chloride 0.9 % bolus 1,000 mL (not administered)  sodium chloride 0.9 % bolus 1,000 mL (1,000 mLs Intravenous New Bag/Given 03/28/15 1313)  sodium chloride 0.9 % bolus 1,000 mL (1,000 mLs Intravenous New Bag/Given 03/28/15 1315)  HYDROmorphone (DILAUDID) injection 1 mg (1 mg Intravenous Given 03/28/15 1227)  metoCLOPramide (REGLAN) injection 10 mg (10 mg Intravenous Given 03/28/15 1229)  ketorolac (TORADOL) 30 MG/ML injection 30 mg (30 mg Intravenous Given 03/28/15 1427)  HYDROmorphone (DILAUDID) injection 1 mg (1 mg Intravenous Given 03/28/15 1558)    New Prescriptions   IBUPROFEN (ADVIL,MOTRIN) 800 MG TABLET    Take 1 tablet (800 mg total) by mouth 3 (three) times daily.   OXYCODONE (ROXICODONE) 5 MG IMMEDIATE RELEASE TABLET    Take 1 tablet (5 mg total) by mouth every 4 (four) hours as needed for severe pain.      I personally performed the services described in this documentation, which was scribed in my presence. The recorded information  has been reviewed and is accurate.      Danny Chapel, MD 03/28/15 [phone]

## 2015-03-28 NOTE — ED Notes (Signed)
Having headache since Sunday.  Been taking ibuprofen with no relief.  Not eating or drinking.  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  Rates headache 10/10.  C/o weakness.  History of stroke in 2005.

## 2015-03-29 LAB — HEPATITIS PANEL, ACUTE
HCV Ab: >11 s/co ratio — ABNORMAL HIGH (ref 0.0–0.9)
HEP A IGM: NEGATIVE
Hep B C IgM: NEGATIVE
Hepatitis B Surface Ag: NEGATIVE

## 2015-04-03 ENCOUNTER — Encounter (HOSPITAL_COMMUNITY): Payer: Self-pay | Admitting: *Deleted

## 2015-04-03 ENCOUNTER — Emergency Department (HOSPITAL_COMMUNITY)
Admission: EM | Admit: 2015-04-03 | Discharge: 2015-04-03 | Disposition: A | Discharge status: Home / Self Care | Payer: Self-pay | Attending: Emergency Medicine | Admitting: Emergency Medicine

## 2015-04-03 DIAGNOSIS — R740 Nonspecific elevation of levels of transaminase and lactic acid dehydrogenase [LDH]: Secondary | ICD-10-CM | POA: Insufficient documentation

## 2015-04-03 DIAGNOSIS — B1920 Unspecified viral hepatitis C without hepatic coma: Secondary | ICD-10-CM | POA: Insufficient documentation

## 2015-04-03 DIAGNOSIS — Z8673 Personal history of transient ischemic attack (TIA), and cerebral infarction without residual deficits: Secondary | ICD-10-CM | POA: Insufficient documentation

## 2015-04-03 DIAGNOSIS — Z8719 Personal history of other diseases of the digestive system: Secondary | ICD-10-CM | POA: Insufficient documentation

## 2015-04-03 DIAGNOSIS — R7401 Elevation of levels of liver transaminase levels: Secondary | ICD-10-CM

## 2015-04-03 DIAGNOSIS — Z8659 Personal history of other mental and behavioral disorders: Secondary | ICD-10-CM | POA: Insufficient documentation

## 2015-04-03 DIAGNOSIS — G4489 Other headache syndrome: Secondary | ICD-10-CM | POA: Insufficient documentation

## 2015-04-03 DIAGNOSIS — I10 Essential (primary) hypertension: Secondary | ICD-10-CM | POA: Insufficient documentation

## 2015-04-03 DIAGNOSIS — E119 Type 2 diabetes mellitus without complications: Secondary | ICD-10-CM | POA: Insufficient documentation

## 2015-04-03 DIAGNOSIS — Z720 Tobacco use: Secondary | ICD-10-CM | POA: Insufficient documentation

## 2015-04-03 LAB — CBG MONITORING, ED: Glucose-Capillary: 342 mg/dL — ABNORMAL HIGH (ref 65–99)

## 2015-04-03 LAB — URINALYSIS, ROUTINE W REFLEX MICROSCOPIC
BILIRUBIN URINE: NEGATIVE
Glucose, UA: >1000 mg/dL — AB
HGB URINE DIPSTICK: NEGATIVE
KETONES UR: NEGATIVE mg/dL
Leukocytes, UA: NEGATIVE
Nitrite: NEGATIVE
PH: 6 (ref 5.0–8.0)
Protein, ur: NEGATIVE mg/dL
Specific Gravity, Urine: 1.01 (ref 1.005–1.030)
UROBILINOGEN UA: 0.2 mg/dL (ref 0.0–1.0)

## 2015-04-03 LAB — COMPREHENSIVE METABOLIC PANEL
ALK PHOS: 108 U/L (ref 38–126)
ALT: 338 U/L — AB (ref 17–63)
AST: 250 U/L — ABNORMAL HIGH (ref 15–41)
Albumin: 3.8 g/dL (ref 3.5–5.0)
Anion gap: 11 (ref 5–15)
BUN: 9 mg/dL (ref 6–20)
CO2: 22 mmol/L (ref 22–32)
CREATININE: 1 mg/dL (ref 0.61–1.24)
Calcium: 9.3 mg/dL (ref 8.9–10.3)
Chloride: 100 mmol/L — ABNORMAL LOW (ref 101–111)
GFR calc non Af Amer: >60 mL/min (ref >60)
Glucose, Bld: 333 mg/dL — ABNORMAL HIGH (ref 65–99)
Potassium: 3.9 mmol/L (ref 3.5–5.1)
Sodium: 133 mmol/L — ABNORMAL LOW (ref 135–145)
Total Bilirubin: 1.1 mg/dL (ref 0.3–1.2)
Total Protein: 7.8 g/dL (ref 6.5–8.1)

## 2015-04-03 LAB — CBC
HEMATOCRIT: 48.5 % (ref 39.0–52.0)
HEMOGLOBIN: 16.9 g/dL (ref 13.0–17.0)
MCH: 31.2 pg (ref 26.0–34.0)
MCHC: 34.8 g/dL (ref 30.0–36.0)
MCV: 89.6 fL (ref 78.0–100.0)
Platelets: 258 10*3/uL (ref 150–400)
RBC: 5.41 MIL/uL (ref 4.22–5.81)
RDW: 13.5 % (ref 11.5–15.5)
WBC: 11.1 10*3/uL — ABNORMAL HIGH (ref 4.0–10.5)

## 2015-04-03 LAB — CSF CELL COUNT WITH DIFFERENTIAL
RBC COUNT CSF: 20 /mm3 — AB
RBC Count, CSF: 3 /mm3 — ABNORMAL HIGH
Tube #: 1
Tube #: 4
WBC, CSF: 2 /mm3 (ref 0–5)
WBC, CSF: 2 /mm3 (ref 0–5)

## 2015-04-03 LAB — SEDIMENTATION RATE: SED RATE: 4 mm/h (ref 0–16)

## 2015-04-03 LAB — URINE MICROSCOPIC-ADD ON

## 2015-04-03 LAB — C-REACTIVE PROTEIN: CRP: 0.9 mg/dL (ref <1.0)

## 2015-04-03 LAB — GLUCOSE, CSF: Glucose, CSF: 137 mg/dL — ABNORMAL HIGH (ref 40–70)

## 2015-04-03 LAB — LIPASE, BLOOD: Lipase: 28 U/L (ref 22–51)

## 2015-04-03 LAB — PROTEIN, CSF: Total  Protein, CSF: 18 mg/dL (ref 15–45)

## 2015-04-03 MED ORDER — LIDOCAINE-EPINEPHRINE (PF) 1 %-1:200000 IJ SOLN
10.0000 mL | Freq: Once | INTRAMUSCULAR | Status: AC
  Administered 2015-04-03: 10 mL
  Filled 2015-04-03: qty 10

## 2015-04-03 MED ORDER — NAPROXEN 500 MG PO TABS: 500.0000 mg | ORAL_TABLET | Freq: Two times a day (BID) | ORAL | Status: DC

## 2015-04-03 MED ORDER — VALPROATE SODIUM 500 MG/5ML IV SOLN
500.0000 mg | Freq: Once | INTRAVENOUS | Status: AC
  Administered 2015-04-03: 500 mg via INTRAVENOUS
  Filled 2015-04-03: qty 5

## 2015-04-03 MED ORDER — HYDROMORPHONE HCL 1 MG/ML IJ SOLN
1.0000 mg | Freq: Once | INTRAMUSCULAR | Status: AC
  Administered 2015-04-03: 1 mg via INTRAVENOUS
  Filled 2015-04-03: qty 1

## 2015-04-03 MED ORDER — ONDANSETRON HCL 4 MG/2ML IJ SOLN
4.0000 mg | Freq: Once | INTRAMUSCULAR | Status: AC
  Administered 2015-04-03: 4 mg via INTRAVENOUS
  Filled 2015-04-03: qty 2

## 2015-04-03 MED ORDER — SODIUM CHLORIDE 0.9 % IV BOLUS (SEPSIS)
1000.0000 mL | Freq: Once | INTRAVENOUS | Status: AC
  Administered 2015-04-03: 1000 mL via INTRAVENOUS

## 2015-04-03 MED ORDER — SUMATRIPTAN SUCCINATE 25 MG PO TABS: 50.0000 mg | ORAL_TABLET | ORAL | Status: DC | PRN

## 2015-04-03 MED ORDER — DOXYCYCLINE HYCLATE 100 MG PO CAPS: 100.0000 mg | ORAL_CAPSULE | Freq: Two times a day (BID) | ORAL | Status: DC

## 2015-04-03 MED ORDER — VALPROATE SODIUM 500 MG/5ML IV SOLN
INTRAVENOUS | Status: AC
  Filled 2015-04-03: qty 5

## 2015-04-03 MED ORDER — PROCHLORPERAZINE EDISYLATE 5 MG/ML IJ SOLN
10.0000 mg | Freq: Once | INTRAMUSCULAR | Status: AC
  Administered 2015-04-03: 10 mg via INTRAVENOUS
  Filled 2015-04-03: qty 2

## 2015-04-03 NOTE — ED Notes (Signed)
Pt alert & oriented x4, stable gait.  Parent given discharge instructions, paperwork & prescription(s). Patient instructed to stop at the registration desk to finish any additional paperwork. Patient  verbalized understanding. Pt left department w/ no further questions.

## 2015-04-03 NOTE — ED Notes (Signed)
Instructed patient to obtain UA sample. Verbalizes understanding. Urinal provided to patient.

## 2015-04-03 NOTE — ED Notes (Signed)
Dr. Sabra Heck at bedside for LP. Writer at bedside.

## 2015-04-03 NOTE — ED Notes (Signed)
MD at bedside. 

## 2015-04-03 NOTE — ED Notes (Signed)
Valproate given at rate of 220 mL/hr to run 55 mL solution over 15 minutes.

## 2015-04-03 NOTE — ED Notes (Signed)
LP completed. Patient alert and oriented x4. No distress noted. Patient offered po fluid. Tolerating well.

## 2015-04-03 NOTE — ED Notes (Signed)
MD made aware of patients increased pain.

## 2015-04-03 NOTE — Discharge Instructions (Signed)
[facility] Primary Care Doctor List    Sinda Du MD. Specialty: Pulmonary Disease Contact information: [place]  [place] [address]  [phone]   Tula Nakayama, MD. Specialty: [facility] Medicine Contact information: [address], [address] [place] [address]  [phone]   Sallee Lange, MD. Specialty: [facility] Medicine Contact information: [place]  [place] [address]  [phone]   Rosita Fire, MD Specialty: Internal Medicine Contact information: [place] [address]  [phone]   Delphina Cahill, MD. Specialty: Internal Medicine Contact information: [place] [address]  [phone]   Marjean Donna, MD. Specialty: Family Medicine Contact information: [place] [address]  [phone]   Leslie Andrea, MD. Specialty: [facility] Medicine Contact information: [place] [place] [address]  [phone]   Asencion Noble, MD. Specialty: Internal Medicine Contact information: [place]  [place] [address]  [phone]

## 2015-04-03 NOTE — ED Provider Notes (Signed)
CSN: [id number]     Arrival date & time 04/03/15  1151 History   First MD Initiated Contact with Patient 04/03/15 1511     Chief Complaint  Patient presents with  . Emesis     (Consider location/radiation/quality/duration/timing/severity/associated sxs/prior Treatment) HPI Comments: The patient is a 49 year old male, he has a history of diabetes, prior stroke, depression and anxiety. He presents with ongoing severe headache. I saw the patient 6 days ago for similar complaints, at that time he was having some photophobia, tingling of the left arm and abdominal discomfort with some vomiting as well. He states that the only symptoms that he has had since he was last seen is the headache. Initially it seemed to be well controlled with medications that was prescribed including oxycodone and ibuprofen. He then ran out of those medications and since has not had any, he states that headache is severe, he is in tears, he has associated neck stiffness, no complaints of his arms, ongoing complaint of persistent nausea and vomiting with minimal abdominal discomfort on the right.  Patient is a 49 y.o. male presenting with vomiting. The history is provided by the patient and medical records.  Emesis   Past Medical History  Diagnosis Date  . Back pain   . Diabetes mellitus   . Anxiety   . Depression   . Hypertension   . CVA (cerebral infarction)   . Stroke   . Liver damage    Past Surgical History  Procedure Laterality Date  . Mandible surgery    . Foot surgery     Family History  Problem Relation Age of Onset  . Stroke Father   . Stroke Sister    History  Substance Use Topics  . Smoking status: Current Every Day Smoker -- 0.50 packs/day for 20 years    Types: Cigarettes  . Smokeless tobacco: Never Used  . Alcohol Use: Yes     Comment: occ    Review of Systems  Gastrointestinal: Positive for vomiting.  All other systems reviewed and are negative.     Allergies  Tylenol; Ultram;  and Benadryl  Home Medications   Prior to Admission medications   Medication Sig Start Date End Date Taking? Authorizing Provider  Melatonin 5 MG CAPS Take 2 capsules by mouth at bedtime as needed (sleep).   Yes Historical Provider, MD  doxycycline (VIBRAMYCIN) 100 MG capsule Take 1 capsule (100 mg total) by mouth 2 (two) times daily. 04/03/15   Noemi Chapel, MD  ibuprofen (ADVIL,MOTRIN) 800 MG tablet Take 1 tablet (800 mg total) by mouth 3 (three) times daily. Patient not taking: Reported on 04/03/2015 03/28/15   Noemi Chapel, MD  naproxen (NAPROSYN) 500 MG tablet Take 1 tablet (500 mg total) by mouth 2 (two) times daily with a meal. 04/03/15   Noemi Chapel, MD  oxyCODONE (ROXICODONE) 5 MG immediate release tablet Take 1 tablet (5 mg total) by mouth every 4 (four) hours as needed for severe pain. Patient not taking: Reported on 04/03/2015 03/28/15   Noemi Chapel, MD  SUMAtriptan (IMITREX) 25 MG tablet Take 2 tablets (50 mg total) by mouth every 2 (two) hours as needed for migraine (ongoing headache). Maximum daily dose 200mg  04/03/15   Noemi Chapel, MD   BP 123/82 mmHg  Pulse 57  Temp(Src) 98.3 F (36.8 C) (Oral)  Resp 15  Ht 5\' 9"  (1.753 m)  Wt 177 lb (80.287 kg)  BMI 26.13 kg/m2  SpO2 95% Physical Exam  Constitutional: He appears well-developed  and well-nourished.  Tearful, anxious appearing  HENT:  Head: Normocephalic and atraumatic.  Mouth/Throat: Oropharynx is clear and moist. No oropharyngeal exudate.  Eyes: Conjunctivae and EOM are normal. Pupils are equal, round, and reactive to light. Right eye exhibits no discharge. Left eye exhibits no discharge. No scleral icterus.  Neck: No JVD present. No thyromegaly present.  Decreased range of motion of the neck secondary to some stiffness  Cardiovascular: Normal rate, regular rhythm, normal heart sounds and intact distal pulses.  Exam reveals no gallop and no friction rub.   No murmur heard. Pulmonary/Chest: Effort normal and breath  sounds normal. No respiratory distress. He has no wheezes. He has no rales.  Abdominal: Soft. Bowel sounds are normal. He exhibits no distension and no mass. There is tenderness ( Mild tenderness to the right upper quadrant and right mid abdomen, no guarding, no hepatosplenomegaly).  Musculoskeletal: Normal range of motion. He exhibits no edema or tenderness.  Lymphadenopathy:    He has no cervical adenopathy.  Neurological: He is alert. Coordination normal.  Speech is clear, memory is intact, coordination is normal with his bilateral upper extremities, strength normal in all 4 extremities.  Skin: Skin is warm and dry. No rash noted. No erythema.  Psychiatric: He has a normal mood and affect. His behavior is normal.  Nursing note and vitals reviewed.   ED Course  LUMBAR PUNCTURE Date/Time: 04/03/2015 4:05 PM Performed by: Noemi Chapel Authorized by: Noemi Chapel Consent: Verbal consent obtained. Written consent obtained. Risks and benefits: risks, benefits and alternatives were discussed Consent given by: patient Patient understanding: patient states understanding of the procedure being performed Patient consent: the patient's understanding of the procedure matches consent given Procedure consent: procedure consent matches procedure scheduled Relevant documents: relevant documents present and verified Test results: test results available and properly labeled Site marked: the operative site was marked Required items: required blood products, implants, devices, and special equipment available Patient identity confirmed: verbally with patient Time out: Immediately prior to procedure a "time out" was called to verify the correct patient, procedure, equipment, support staff and site/side marked as required. Indications: evaluation for infection and evaluation for subarachnoid hemorrhage Anesthesia: local infiltration Local anesthetic: lidocaine 1% with epinephrine Anesthetic total: 5  ml Patient sedated: no Preparation: Patient was prepped and draped in the usual sterile fashion. Lumbar space: L3-L4 interspace Patient's position: right lateral decubitus Needle gauge: 20 Needle type: spinal needle - Quincke tip Needle length: 3.5 in Number of attempts: 1 Fluid appearance: clear Tubes of fluid: 4 Total volume: 7 ml Post-procedure: site cleaned and adhesive bandage applied Patient tolerance: Patient tolerated the procedure well with no immediate complications   (including critical care time) Labs Review Labs Reviewed  COMPREHENSIVE METABOLIC PANEL - Abnormal; Notable for the following:    Sodium 133 (*)    Chloride 100 (*)    Glucose, Bld 333 (*)    AST 250 (*)    ALT 338 (*)    All other components within normal limits  CBC - Abnormal; Notable for the following:    WBC 11.1 (*)    All other components within normal limits  URINALYSIS, ROUTINE W REFLEX MICROSCOPIC (NOT AT [facility]) - Abnormal; Notable for the following:    Glucose, UA >1000 (*)    All other components within normal limits  CSF CELL COUNT WITH DIFFERENTIAL - Abnormal; Notable for the following:    RBC Count, CSF 20 (*)    All other components within normal limits  CSF  CELL COUNT WITH DIFFERENTIAL - Abnormal; Notable for the following:    Appearance, CSF CLEAR (*)    RBC Count, CSF 3 (*)    All other components within normal limits  GLUCOSE, CSF - Abnormal; Notable for the following:    Glucose, CSF 137 (*)    All other components within normal limits  CBG MONITORING, ED - Abnormal; Notable for the following:    Glucose-Capillary 342 (*)    All other components within normal limits  CSF CULTURE  LIPASE, BLOOD  PROTEIN, CSF  SEDIMENTATION RATE  URINE MICROSCOPIC-ADD ON  CRYPTOCOCCAL ANTIGEN, CSF  VDRL, CSF  ROCKY MTN SPOTTED FVR ABS PNL(IGG+IGM)  C-REACTIVE PROTEIN  WEST NILE ANTIBODIES, IGG AND IGM  HERPES SIMPLEX VIRUS(HSV) DNA BY PCR    Imaging Review No results found.    MDM    Final diagnoses:  Other headache syndrome  Transaminitis  Hepatitis C virus infection without hepatic coma, unspecified chronicity    The patient does report severe symptoms, he feels that nothing is really improved since he was last seen. Based on his initial blood work he has a slight leukocytosis of 11,000, hyperglycemia of 340, slight hyponatremia at 133 and ongoing transaminitis which has slightly worsened. The patient now as that he had approximately 9 take bites 2 weeks ago, his wife had picked off the ticks. He has not had a rash or fever. There is increased concern that this could be a form of encephalitis or meningitis, he has no fever and his vital signs are otherwise unremarkable however a spinal tap would be prudent at this juncture. Would also have some concern for ongoing subarachnoid hemorrhage, initial CT was negative, will perform lumbar puncture at this time.  The patient has totally normal cerebral spinal fluid other than a couple of white blood cells and red blood cells. Gram stain is negative, labs show ongoing transaminitis. He has a positive hepatitis panel showing hepatitis C, he will be referred to family doctor in the community for evaluation of this. His daughter has now presented and states that he has headaches all the time, the patient has not disclosed this information to me but when confronted about it states that in fact he does. I do think he should be treated for [facility] spotted fever to cover tick borne illness with doxycycline given his recent exposure and worsening headaches. There is nothing based on his cerebrospinal fluid or blood work that makes me concerned that he would need admission to the hospital. He has been given multiple medications and has felt much better.  Meds given in ED:  Medications  sodium chloride 0.9 % bolus 1,000 mL (0 mLs Intravenous Stopped 04/03/15 1802)  HYDROmorphone (DILAUDID) injection 1 mg (1 mg Intravenous Given 04/03/15  1550)  ondansetron (ZOFRAN) injection 4 mg (4 mg Intravenous Given 04/03/15 1549)  prochlorperazine (COMPAZINE) injection 10 mg (10 mg Intravenous Given 04/03/15 1550)  lidocaine-EPINEPHrine (XYLOCAINE-EPINEPHrine) 1 %-1:200000 (PF) injection 10 mL (10 mLs Other Given 04/03/15 1552)  valproate (DEPACON) 500 mg in dextrose 5 % 50 mL IVPB (0 mg Intravenous Stopped 04/03/15 1911)    New Prescriptions   DOXYCYCLINE (VIBRAMYCIN) 100 MG CAPSULE    Take 1 capsule (100 mg total) by mouth 2 (two) times daily.   NAPROXEN (NAPROSYN) 500 MG TABLET    Take 1 tablet (500 mg total) by mouth 2 (two) times daily with a meal.   SUMATRIPTAN (IMITREX) 25 MG TABLET    Take 2 tablets (50 mg  total) by mouth every 2 (two) hours as needed for migraine (ongoing headache). Maximum daily dose 200mg       Noemi Chapel, MD 04/03/15 2002

## 2015-04-03 NOTE — ED Notes (Signed)
Pt seen on 7/19 and continues symptoms- gen. Weakness, emesis, abd pain, neck pain, HA, decrease in appetite

## 2015-04-03 NOTE — ED Notes (Signed)
Pt informed of wait status. Delay explained.

## 2015-04-04 LAB — HERPES SIMPLEX VIRUS(HSV) DNA BY PCR
HSV 1 DNA: NEGATIVE
HSV 2 DNA: NEGATIVE

## 2015-04-04 LAB — VDRL, CSF: VDRL Quant, CSF: NONREACTIVE

## 2015-04-04 LAB — ROCKY MTN SPOTTED FVR ABS PNL(IGG+IGM)
RMSF IGG: NEGATIVE
RMSF IgM: 0.27 index (ref 0.00–0.89)

## 2015-04-04 LAB — CRYPTOCOCCAL ANTIGEN, CSF: CRYPTO AG: NEGATIVE

## 2015-04-05 NOTE — ED Notes (Signed)
Patient came in today stating that them imitrex prescribed for his headache was >$500. I spoke with Dr. Roderic Palau regarding prescribing something else in place of the imitrex for the headache. Pt was given a handwritten Rx for Hydrocodone 5mg , one po q 6-8 hours for pain. #10 (ten).

## 2015-04-07 LAB — CSF CULTURE: CULTURE: NO GROWTH

## 2015-04-07 LAB — CSF CULTURE W GRAM STAIN

## 2015-08-22 ENCOUNTER — Ambulatory Visit: Payer: Self-pay | Admitting: Physician Assistant

## 2016-01-25 ENCOUNTER — Emergency Department (HOSPITAL_COMMUNITY): Payer: No Typology Code available for payment source

## 2016-01-25 ENCOUNTER — Encounter (HOSPITAL_COMMUNITY): Payer: Self-pay | Admitting: Emergency Medicine

## 2016-01-25 ENCOUNTER — Emergency Department (HOSPITAL_COMMUNITY)
Admission: EM | Admit: 2016-01-25 | Discharge: 2016-01-25 | Disposition: A | Discharge status: Home / Self Care | Payer: No Typology Code available for payment source | Attending: Emergency Medicine | Admitting: Emergency Medicine

## 2016-01-25 DIAGNOSIS — E119 Type 2 diabetes mellitus without complications: Secondary | ICD-10-CM | POA: Insufficient documentation

## 2016-01-25 DIAGNOSIS — Z8673 Personal history of transient ischemic attack (TIA), and cerebral infarction without residual deficits: Secondary | ICD-10-CM | POA: Diagnosis not present

## 2016-01-25 DIAGNOSIS — Y999 Unspecified external cause status: Secondary | ICD-10-CM | POA: Insufficient documentation

## 2016-01-25 DIAGNOSIS — S63502A Unspecified sprain of left wrist, initial encounter: Secondary | ICD-10-CM | POA: Diagnosis not present

## 2016-01-25 DIAGNOSIS — F329 Major depressive disorder, single episode, unspecified: Secondary | ICD-10-CM | POA: Insufficient documentation

## 2016-01-25 DIAGNOSIS — Y92410 Unspecified street and highway as the place of occurrence of the external cause: Secondary | ICD-10-CM | POA: Diagnosis not present

## 2016-01-25 DIAGNOSIS — F17210 Nicotine dependence, cigarettes, uncomplicated: Secondary | ICD-10-CM | POA: Diagnosis not present

## 2016-01-25 DIAGNOSIS — I10 Essential (primary) hypertension: Secondary | ICD-10-CM | POA: Insufficient documentation

## 2016-01-25 DIAGNOSIS — Y939 Activity, unspecified: Secondary | ICD-10-CM | POA: Insufficient documentation

## 2016-01-25 DIAGNOSIS — S6992XA Unspecified injury of left wrist, hand and finger(s), initial encounter: Secondary | ICD-10-CM | POA: Diagnosis present

## 2016-01-25 MED ORDER — DICLOFENAC SODIUM 75 MG PO TBEC: 75.0000 mg | DELAYED_RELEASE_TABLET | Freq: Two times a day (BID) | ORAL | Status: DC
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
## 2016-01-25 NOTE — ED Notes (Signed)
Ice pack applied to left wrist
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
## 2016-02-07 ENCOUNTER — Encounter: Payer: Self-pay | Admitting: Orthopaedic Surgery

## 2016-02-07 ENCOUNTER — Ambulatory Visit (INDEPENDENT_AMBULATORY_CARE_PROVIDER_SITE_OTHER): Payer: Self-pay | Admitting: Orthopaedic Surgery

## 2016-02-07 VITALS — BP 146/86 | HR 92 | Temp 99.3°F | Ht 66.0 in | Wt 169.0 lb

## 2016-02-07 DIAGNOSIS — G5622 Lesion of ulnar nerve, left upper limb: Secondary | ICD-10-CM

## 2016-02-07 DIAGNOSIS — M79642 Pain in left hand: Secondary | ICD-10-CM

## 2016-02-07 MED ORDER — NAPROXEN 500 MG PO TABS: 500.0000 mg | ORAL_TABLET | Freq: Two times a day (BID) | ORAL | Status: DC

## 2016-02-07 MED ORDER — HYDROCODONE-ACETAMINOPHEN 5-325 MG PO TABS: 1.0000 | ORAL_TABLET | ORAL | Status: DC | PRN
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
## 2016-02-07 NOTE — Patient Instructions (Addendum)
Referred to Dr. Merlene Laughter for NCV/EMG study upper extremities. Continue medication.

## 2016-08-21 ENCOUNTER — Emergency Department (HOSPITAL_COMMUNITY): Payer: Self-pay

## 2016-08-21 ENCOUNTER — Emergency Department (HOSPITAL_COMMUNITY)
Admission: EM | Admit: 2016-08-21 | Discharge: 2016-08-21 | Disposition: A | Discharge status: Home / Self Care | Payer: Self-pay | Attending: Emergency Medicine | Admitting: Emergency Medicine

## 2016-08-21 ENCOUNTER — Encounter (HOSPITAL_COMMUNITY): Payer: Self-pay

## 2016-08-21 DIAGNOSIS — E1165 Type 2 diabetes mellitus with hyperglycemia: Secondary | ICD-10-CM | POA: Insufficient documentation

## 2016-08-21 DIAGNOSIS — R079 Chest pain, unspecified: Secondary | ICD-10-CM | POA: Insufficient documentation

## 2016-08-21 DIAGNOSIS — F17210 Nicotine dependence, cigarettes, uncomplicated: Secondary | ICD-10-CM | POA: Insufficient documentation

## 2016-08-21 DIAGNOSIS — R739 Hyperglycemia, unspecified: Secondary | ICD-10-CM

## 2016-08-21 DIAGNOSIS — I10 Essential (primary) hypertension: Secondary | ICD-10-CM | POA: Insufficient documentation

## 2016-08-21 LAB — CBC WITH DIFFERENTIAL/PLATELET
BASOS ABS: 0.1 10*3/uL (ref 0.0–0.1)
Basophils Relative: 1 %
Eosinophils Absolute: 0.1 10*3/uL (ref 0.0–0.7)
Eosinophils Relative: 2 %
HEMATOCRIT: 46.1 % (ref 39.0–52.0)
Hemoglobin: 15.8 g/dL (ref 13.0–17.0)
LYMPHS PCT: 23 %
Lymphs Abs: 1.5 10*3/uL (ref 0.7–4.0)
MCH: 32.6 pg (ref 26.0–34.0)
MCHC: 34.3 g/dL (ref 30.0–36.0)
MCV: 95.1 fL (ref 78.0–100.0)
Monocytes Absolute: 0.7 10*3/uL (ref 0.1–1.0)
Monocytes Relative: 11 %
NEUTROS ABS: 4 10*3/uL (ref 1.7–7.7)
Neutrophils Relative %: 63 %
Platelets: 162 10*3/uL (ref 150–400)
RBC: 4.85 MIL/uL (ref 4.22–5.81)
RDW: 12.2 % (ref 11.5–15.5)
WBC: 6.4 10*3/uL (ref 4.0–10.5)

## 2016-08-21 LAB — BASIC METABOLIC PANEL
Anion gap: 13 (ref 5–15)
BUN: 9 mg/dL (ref 6–20)
CO2: 20 mmol/L — AB (ref 22–32)
Calcium: 9.5 mg/dL (ref 8.9–10.3)
Chloride: 99 mmol/L — ABNORMAL LOW (ref 101–111)
Creatinine, Ser: 0.89 mg/dL (ref 0.61–1.24)
GFR calc non Af Amer: >60 mL/min (ref >60)
GLUCOSE: 520 mg/dL — AB (ref 65–99)
Potassium: 3.9 mmol/L (ref 3.5–5.1)
Sodium: 132 mmol/L — ABNORMAL LOW (ref 135–145)

## 2016-08-21 LAB — CBG MONITORING, ED: GLUCOSE-CAPILLARY: 196 mg/dL — AB (ref 65–99)

## 2016-08-21 LAB — TROPONIN I: Troponin I: <0.03 ng/mL (ref <0.03)

## 2016-08-21 MED ORDER — INSULIN ASPART 100 UNIT/ML ~~LOC~~ SOLN
5.0000 [IU] | Freq: Once | SUBCUTANEOUS | Status: AC
  Administered 2016-08-21: 5 [IU] via INTRAVENOUS
  Filled 2016-08-21: qty 1

## 2016-08-21 MED ORDER — INSULIN REGULAR BOLUS VIA INFUSION: 5.0000 [IU] | Freq: Once | INTRAVENOUS | Status: DC

## 2016-08-21 MED ORDER — FENTANYL CITRATE (PF) 100 MCG/2ML IJ SOLN
50.0000 ug | Freq: Once | INTRAMUSCULAR | Status: AC
  Administered 2016-08-21: 50 ug via INTRAVENOUS
  Filled 2016-08-21: qty 2

## 2016-08-21 MED ORDER — SODIUM CHLORIDE 0.9 % IV BOLUS (SEPSIS)
1000.0000 mL | Freq: Once | INTRAVENOUS | Status: AC
  Administered 2016-08-21: 1000 mL via INTRAVENOUS

## 2016-08-21 MED ORDER — METFORMIN HCL 500 MG PO TABS: 500.0000 mg | ORAL_TABLET | Freq: Two times a day (BID) | ORAL | 2 refills | Status: DC

## 2016-08-21 MED ORDER — ONDANSETRON HCL 4 MG/2ML IJ SOLN
4.0000 mg | Freq: Once | INTRAMUSCULAR | Status: AC
  Administered 2016-08-21: 4 mg via INTRAVENOUS
  Filled 2016-08-21: qty 2

## 2016-08-21 NOTE — Care Management (Signed)
CM consult received for med assistance. Pt needing med off $4 list. Discussed with MD.

## 2016-08-21 NOTE — ED Triage Notes (Signed)
Pt reports  Generalized weakness, nonproductive cough, chest heaviness, and sob x 3 days.

## 2016-08-21 NOTE — ED Notes (Signed)
Pt returned from xray

## 2016-08-21 NOTE — ED Notes (Signed)
Lung sounds clear 

## 2016-08-21 NOTE — ED Notes (Signed)
Pt taken to xray 

## 2016-08-21 NOTE — ED Notes (Addendum)
CRITICAL VALUE ALERT  Critical value received:  Glucose 520  Date of notification:  08/21/2016  Time of notification:  [id number]  Critical value read back:Yes.    Nurse who received alert:  [facility] RN  MD notified (1st page):  Dr. Lacinda Axon  Time of first page:  15  MD notified (2nd page):  Time of second page:  Responding MD: Dr. Lacinda Axon  Time MD responded:  1030

## 2016-08-21 NOTE — ED Provider Notes (Signed)
[facility] DEPT Provider Note   CSN: [id number] Arrival date & time: 08/21/16  [id number]  By signing my name below, I, Danny [name], attest that this documentation has been prepared under the direction and in the presence of Nat Christen, MD. Electronically Signed: Hansel [name], ED Scribe. 08/21/16. 10:34 AM.     History   Chief Complaint Chief Complaint  Patient presents with  . Chest Pain  . Cough    HPI Danny [name] is a 50 y.o. male with h/o DM, HTN who presents to the Emergency Department complaining of moderate left-sided CP x 3 days with associated left arm paresthesia, HA, generalized weakness, SOB, nausea. He describes his CP as a pressure sensation. No h/o of similar symptoms. Pt states his CBGs have been baseline with his current symptoms. He is not currently on any medications for management of his DM. Pt is a current smoker. No h/o heart disease. No known family h/o heart disease. He denies diaphoresis, vomiting.   The history is provided by the patient. No language interpreter was used.    Past Medical History:  Diagnosis Date  . Anxiety   . Back pain   . CVA (cerebral infarction)   . Depression   . Diabetes mellitus   . Hypertension   . Liver damage   . Stroke [facility])     Patient Active Problem List   Diagnosis Date Noted  . GREATER TROCHANTERIC BURSITIS 07/03/2007  . INCREASED BLOOD PRESSURE 07/03/2007  . BACK PAIN, LUMBAR 05/29/2007  . NUMBNESS 05/29/2007  . INSOMNIA, CHRONIC 05/13/2007  . HYPERLIPIDEMIA 01/13/2007  . ANXIETY DEPRESSION 01/13/2007  . SYNDROME, CHRONIC PAIN 01/13/2007  . CVA 01/13/2007  . ARTHRITIS 01/13/2007    Past Surgical History:  Procedure Laterality Date  . FOOT SURGERY    . MANDIBLE SURGERY         Home Medications    Prior to Admission medications   Medication Sig Start Date End Date Taking? Authorizing Provider  metFORMIN (GLUCOPHAGE) 500 MG tablet Take 1 tablet (500 mg total) by mouth 2 (two) times daily with a  meal. 08/21/16   Nat Christen, MD    Family History Family History  Problem Relation Age of Onset  . Stroke Father   . Stroke Sister     Social History Social History  Substance Use Topics  . Smoking status: Current Every Day Smoker    Packs/day: 0.50    Years: 20.00    Types: Cigarettes  . Smokeless tobacco: Never Used  . Alcohol use Yes     Comment: occ     Allergies   Tylenol [acetaminophen]; Ultram [tramadol]; and Benadryl [diphenhydramine]   Review of Systems Review of Systems A complete 10 system review of systems was obtained and all systems are negative except as noted in the HPI and PMH.    Physical Exam Updated Vital Signs BP 135/80   Pulse 76   Temp 98.2 F (36.8 C) (Oral)   Resp 22   Ht 6' (1.829 m)   Wt 169 lb (76.7 kg)   SpO2 97%   BMI 22.92 kg/m   Physical Exam  Constitutional: He is oriented to person, place, and time.  Pale, appears to be in pain and slightly dehydrated.   HENT:  Head: Normocephalic and atraumatic.  Eyes: Conjunctivae are normal.  Neck: Neck supple.  Cardiovascular: Normal rate and regular rhythm.   Pulmonary/Chest: Effort normal and breath sounds normal.  Abdominal: Soft. Bowel sounds are normal.  Musculoskeletal: Normal  range of motion.  Neurological: He is alert and oriented to person, place, and time.  Skin: Skin is warm and dry.  Psychiatric: He has a normal mood and affect. His behavior is normal.  Nursing note and vitals reviewed.    ED Treatments / Results   DIAGNOSTIC STUDIES: Oxygen Saturation is 97% on RA, normal by my interpretation.    COORDINATION OF CARE: 10:33 AM Discussed treatment plan with pt at bedside which includes lab work, CXR and pt agreed to plan.    Labs (all labs ordered are listed, but only abnormal results are displayed) Labs Reviewed  BASIC METABOLIC PANEL - Abnormal; Notable for the following:       Result Value   Sodium 132 (*)    Chloride 99 (*)    CO2 20 (*)    Glucose,  Bld 520 (*)    All other components within normal limits  CBG MONITORING, ED - Abnormal; Notable for the following:    Glucose-Capillary 196 (*)    All other components within normal limits  CBC WITH DIFFERENTIAL/PLATELET  TROPONIN I    EKG  EKG Interpretation  Date/Time:  Wednesday August 21 2016 09:34:39 EST Ventricular Rate:  109 PR Interval:    QRS Duration: 92 QT Interval:  325 QTC Calculation: 438 R Axis:   67 Text Interpretation:  Sinus tachycardia Anteroseptal infarct, age indeterminate Confirmed by Paije Goodhart  MD, Anaalicia Reimann ([id number]) on 08/21/2016 1:39:09 PM       Radiology Dg Chest 2 View  Result Date: 08/21/2016 CLINICAL DATA:  Cough and headache; chest pain EXAM: CHEST  2 VIEW COMPARISON:  Jan 08, 2015 FINDINGS: Lungs are clear. Heart size and pulmonary vascularity are normal. No adenopathy. No pneumothorax. No bone lesions. IMPRESSION: No edema or consolidation. Electronically Signed   By: Lowella Grip III M.D.   On: 08/21/2016 10:06    Procedures Procedures (including critical care time)  Medications Ordered in ED Medications  sodium chloride 0.9 % bolus 1,000 mL (0 mLs Intravenous Stopped 08/21/16 1258)  fentaNYL (SUBLIMAZE) injection 50 mcg (50 mcg Intravenous Given 08/21/16 1135)  ondansetron (ZOFRAN) injection 4 mg (4 mg Intravenous Given 08/21/16 1135)  insulin aspart (novoLOG) injection 5 Units (5 Units Intravenous Given 08/21/16 1135)  fentaNYL (SUBLIMAZE) injection 50 mcg (50 mcg Intravenous Given 08/21/16 1403)     Initial Impression / Assessment and Plan / ED Course  I have reviewed the triage vital signs and the nursing notes.  Pertinent labs & imaging results that were available during my care of the patient were reviewed by me and considered in my medical decision making (see chart for details).  Clinical Course     Patient is hemodynamically stable. EKG and troponin showed no acute findings. Glucose has improved dramatically with IV fluids and  regular insulin. Patient has been noncompliant with his metformin. Will restart Rx. Findings were discussed with the patient, his wife, his daughter.  Final Clinical Impressions(s) / ED Diagnoses   Final diagnoses:  Chest pain, unspecified type  Hyperglycemia    New Prescriptions New Prescriptions   METFORMIN (GLUCOPHAGE) 500 MG TABLET    Take 1 tablet (500 mg total) by mouth 2 (two) times daily with a meal.   I personally performed the services described in this documentation, which was scribed in my presence. The recorded information has been reviewed and is accurate.     Nat Christen, MD 08/21/16 1452

## 2016-08-21 NOTE — ED Notes (Signed)
EDP sppoke with SW

## 2016-08-21 NOTE — Discharge Instructions (Signed)
Tests showed no life-threatening condition. Recommend restarting her metformin 500 mg twice a day. Prescription given for same. Stop smoking. You will need primary care follow-up.

## 2016-11-18 ENCOUNTER — Emergency Department (HOSPITAL_COMMUNITY)
Admission: EM | Admit: 2016-11-18 | Discharge: 2016-11-18 | Disposition: A | Discharge status: Home / Self Care | Payer: Self-pay | Attending: Emergency Medicine | Admitting: Emergency Medicine

## 2016-11-18 ENCOUNTER — Emergency Department (HOSPITAL_COMMUNITY): Payer: Self-pay

## 2016-11-18 ENCOUNTER — Encounter (HOSPITAL_COMMUNITY): Payer: Self-pay | Admitting: Emergency Medicine

## 2016-11-18 DIAGNOSIS — Y999 Unspecified external cause status: Secondary | ICD-10-CM | POA: Insufficient documentation

## 2016-11-18 DIAGNOSIS — Z7984 Long term (current) use of oral hypoglycemic drugs: Secondary | ICD-10-CM | POA: Insufficient documentation

## 2016-11-18 DIAGNOSIS — E119 Type 2 diabetes mellitus without complications: Secondary | ICD-10-CM | POA: Insufficient documentation

## 2016-11-18 DIAGNOSIS — S60222A Contusion of left hand, initial encounter: Secondary | ICD-10-CM | POA: Insufficient documentation

## 2016-11-18 DIAGNOSIS — Y929 Unspecified place or not applicable: Secondary | ICD-10-CM | POA: Insufficient documentation

## 2016-11-18 DIAGNOSIS — F17210 Nicotine dependence, cigarettes, uncomplicated: Secondary | ICD-10-CM | POA: Insufficient documentation

## 2016-11-18 DIAGNOSIS — I10 Essential (primary) hypertension: Secondary | ICD-10-CM | POA: Insufficient documentation

## 2016-11-18 DIAGNOSIS — W2201XA Walked into wall, initial encounter: Secondary | ICD-10-CM | POA: Insufficient documentation

## 2016-11-18 DIAGNOSIS — Y939 Activity, unspecified: Secondary | ICD-10-CM | POA: Insufficient documentation

## 2016-11-18 MED ORDER — IBUPROFEN 800 MG PO TABS: 800.0000 mg | ORAL_TABLET | Freq: Three times a day (TID) | ORAL | 0 refills | Status: DC

## 2016-11-18 MED ORDER — OXYCODONE-ACETAMINOPHEN 5-325 MG PO TABS
1.0000 | ORAL_TABLET | Freq: Once | ORAL | Status: AC
  Administered 2016-11-18: 1 via ORAL
  Filled 2016-11-18: qty 1

## 2016-11-18 NOTE — Discharge Instructions (Signed)
Elevate your hand when possible.  Keep the dressing in place for at least 4-5 days.  Call Dr. Ruthe Mannan office in one week if the pain is not improving

## 2016-11-18 NOTE — ED Notes (Signed)
Pt ambulatory to waiting room. Pt verbalized understanding of discharge instructions.   
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
## 2016-12-18 ENCOUNTER — Emergency Department (HOSPITAL_COMMUNITY): Payer: Self-pay

## 2016-12-18 ENCOUNTER — Encounter (HOSPITAL_COMMUNITY): Payer: Self-pay | Admitting: Emergency Medicine

## 2016-12-18 ENCOUNTER — Emergency Department (HOSPITAL_COMMUNITY)
Admission: EM | Admit: 2016-12-18 | Discharge: 2016-12-18 | Disposition: A | Discharge status: Home / Self Care | Payer: Self-pay | Attending: Emergency Medicine | Admitting: Emergency Medicine

## 2016-12-18 DIAGNOSIS — I10 Essential (primary) hypertension: Secondary | ICD-10-CM | POA: Insufficient documentation

## 2016-12-18 DIAGNOSIS — Y92007 Garden or yard of unspecified non-institutional (private) residence as the place of occurrence of the external cause: Secondary | ICD-10-CM | POA: Insufficient documentation

## 2016-12-18 DIAGNOSIS — E119 Type 2 diabetes mellitus without complications: Secondary | ICD-10-CM | POA: Insufficient documentation

## 2016-12-18 DIAGNOSIS — X500XXA Overexertion from strenuous movement or load, initial encounter: Secondary | ICD-10-CM | POA: Insufficient documentation

## 2016-12-18 DIAGNOSIS — Y999 Unspecified external cause status: Secondary | ICD-10-CM | POA: Insufficient documentation

## 2016-12-18 DIAGNOSIS — M5440 Lumbago with sciatica, unspecified side: Secondary | ICD-10-CM | POA: Insufficient documentation

## 2016-12-18 DIAGNOSIS — Y93F2 Activity, caregiving, lifting: Secondary | ICD-10-CM | POA: Insufficient documentation

## 2016-12-18 DIAGNOSIS — Z7984 Long term (current) use of oral hypoglycemic drugs: Secondary | ICD-10-CM | POA: Insufficient documentation

## 2016-12-18 DIAGNOSIS — F17210 Nicotine dependence, cigarettes, uncomplicated: Secondary | ICD-10-CM | POA: Insufficient documentation

## 2016-12-18 LAB — URINALYSIS, ROUTINE W REFLEX MICROSCOPIC
BILIRUBIN URINE: NEGATIVE
Bacteria, UA: NONE SEEN
Glucose, UA: >=500 mg/dL — AB
Ketones, ur: NEGATIVE mg/dL
Leukocytes, UA: NEGATIVE
NITRITE: NEGATIVE
PROTEIN: NEGATIVE mg/dL
Specific Gravity, Urine: 1.027 (ref 1.005–1.030)
Squamous Epithelial / LPF: NONE SEEN
pH: 5 (ref 5.0–8.0)

## 2016-12-18 LAB — CBC WITH DIFFERENTIAL/PLATELET
BASOS ABS: 0.1 10*3/uL (ref 0.0–0.1)
BASOS PCT: 1 %
EOS ABS: 0.2 10*3/uL (ref 0.0–0.7)
EOS PCT: 3 %
HCT: 48.5 % (ref 39.0–52.0)
Hemoglobin: 17 g/dL (ref 13.0–17.0)
LYMPHS PCT: 22 %
Lymphs Abs: 1.9 10*3/uL (ref 0.7–4.0)
MCH: 32.3 pg (ref 26.0–34.0)
MCHC: 35.1 g/dL (ref 30.0–36.0)
MCV: 92 fL (ref 78.0–100.0)
MONO ABS: 0.8 10*3/uL (ref 0.1–1.0)
Monocytes Relative: 10 %
Neutro Abs: 5.5 10*3/uL (ref 1.7–7.7)
Neutrophils Relative %: 64 %
Platelets: 188 10*3/uL (ref 150–400)
RBC: 5.27 MIL/uL (ref 4.22–5.81)
RDW: 12.3 % (ref 11.5–15.5)
WBC: 8.5 10*3/uL (ref 4.0–10.5)

## 2016-12-18 LAB — COMPREHENSIVE METABOLIC PANEL
ALT: 101 U/L — ABNORMAL HIGH (ref 17–63)
AST: 73 U/L — AB (ref 15–41)
Albumin: 3.8 g/dL (ref 3.5–5.0)
Alkaline Phosphatase: 144 U/L — ABNORMAL HIGH (ref 38–126)
Anion gap: 7 (ref 5–15)
BUN: 13 mg/dL (ref 6–20)
CHLORIDE: 106 mmol/L (ref 101–111)
CO2: 23 mmol/L (ref 22–32)
Calcium: 9.3 mg/dL (ref 8.9–10.3)
Creatinine, Ser: 0.83 mg/dL (ref 0.61–1.24)
GFR calc Af Amer: >60 mL/min (ref >60)
Glucose, Bld: 230 mg/dL — ABNORMAL HIGH (ref 65–99)
POTASSIUM: 4.3 mmol/L (ref 3.5–5.1)
SODIUM: 136 mmol/L (ref 135–145)
Total Bilirubin: 0.9 mg/dL (ref 0.3–1.2)
Total Protein: 7.4 g/dL (ref 6.5–8.1)

## 2016-12-18 LAB — CBG MONITORING, ED: Glucose-Capillary: 353 mg/dL — ABNORMAL HIGH (ref 65–99)

## 2016-12-18 MED ORDER — CYCLOBENZAPRINE HCL 10 MG PO TABS: 10.0000 mg | ORAL_TABLET | Freq: Three times a day (TID) | ORAL | 0 refills | Status: DC | PRN

## 2016-12-18 MED ORDER — ONDANSETRON HCL 4 MG/2ML IJ SOLN
4.0000 mg | Freq: Once | INTRAMUSCULAR | Status: AC
  Administered 2016-12-18: 4 mg via INTRAVENOUS
  Filled 2016-12-18: qty 2

## 2016-12-18 MED ORDER — HYDROMORPHONE HCL 1 MG/ML IJ SOLN
1.0000 mg | Freq: Once | INTRAMUSCULAR | Status: AC
  Administered 2016-12-18: 1 mg via INTRAVENOUS
  Filled 2016-12-18: qty 1

## 2016-12-18 MED ORDER — HYDROMORPHONE HCL 1 MG/ML IJ SOLN
1.0000 mg | Freq: Once | INTRAMUSCULAR | Status: AC
Start: 2016-12-18 — End: 2016-12-18
  Administered 2016-12-18: 1 mg via INTRAVENOUS
  Filled 2016-12-18: qty 1

## 2016-12-18 MED ORDER — OXYCODONE HCL 5 MG PO TABS: ORAL_TABLET | ORAL | 0 refills | Status: DC

## 2016-12-18 NOTE — Discharge Instructions (Signed)
Follow up at the [facility] next week

## 2016-12-18 NOTE — ED Triage Notes (Signed)
Pt has chronic back pain. Started x 1 week ago and now radiating down both legs. nad

## 2016-12-18 NOTE — ED Provider Notes (Signed)
[place] DEPT Provider Note   CSN: [id number] Arrival date & time: 12/18/16  0906   By signing my name below, I, Delton Prairie, attest that this documentation has been prepared under the direction and in the presence of Milton Ferguson, MD  Electronically Signed: Delton Prairie, ED Scribe. 12/18/16. 10:32 AM.   History   Chief Complaint Chief Complaint  Patient presents with  . Back Pain    HPI Comments:  Danny [name] is a 51 y.o. male, with a PMHx of CVA, DM and HTN, who presents to the Emergency Department complaining of chronic, lower back pain which worsened 1 week ago. He reports his pain radiates down his bilateral lower extremities. Pt also reports subjective numbness to his bilateral lower extremities. He states his job requires heavy lifting and additionally notes he is moving into another house and has been doing yard work and lifting heavy objects. His pain is worse with palpation and when lifting his legs. No alleviating factors noted. Pt denies any other associated symptoms. He does not have a PCP. No other complaints noted.    The history is provided by the patient. No language interpreter was used.  Back Pain   This is a chronic problem. The current episode started more than 2 days ago. The problem occurs constantly. The problem has not changed since onset.The pain is associated with lifting heavy objects. The pain is present in the lumbar spine. The pain is moderate. The symptoms are aggravated by certain positions. Pertinent negatives include no chest pain, no numbness, no headaches and no abdominal pain. He has tried nothing for the symptoms.    Past Medical History:  Diagnosis Date  . Anxiety   . Back pain   . CVA (cerebral infarction)   . Depression   . Diabetes mellitus   . Hypertension   . Liver damage   . Stroke [facility])     Patient Active Problem List   Diagnosis Date Noted  . GREATER TROCHANTERIC BURSITIS 07/03/2007  . INCREASED BLOOD PRESSURE  07/03/2007  . BACK PAIN, LUMBAR 05/29/2007  . NUMBNESS 05/29/2007  . INSOMNIA, CHRONIC 05/13/2007  . HYPERLIPIDEMIA 01/13/2007  . ANXIETY DEPRESSION 01/13/2007  . SYNDROME, CHRONIC PAIN 01/13/2007  . CVA 01/13/2007  . ARTHRITIS 01/13/2007    Past Surgical History:  Procedure Laterality Date  . FOOT SURGERY    . MANDIBLE SURGERY         Home Medications    Prior to Admission medications   Medication Sig Start Date End Date Taking? Authorizing Provider  ibuprofen (ADVIL,MOTRIN) 800 MG tablet Take 1 tablet (800 mg total) by mouth 3 (three) times daily. 11/18/16  Yes Tammy Triplett, PA-C  metFORMIN (GLUCOPHAGE) 500 MG tablet Take 1 tablet (500 mg total) by mouth 2 (two) times daily with a meal. Patient taking differently: Take 500 mg by mouth daily.  08/21/16  Yes Nat Christen, MD    Family History Family History  Problem Relation Age of Onset  . Stroke Father   . Stroke Sister     Social History Social History  Substance Use Topics  . Smoking status: Current Every Day Smoker    Packs/day: 0.50    Years: 20.00    Types: Cigarettes  . Smokeless tobacco: Never Used  . Alcohol use Yes     Comment: occ     Allergies   Tylenol [acetaminophen]; Ultram [tramadol]; and Benadryl [diphenhydramine]   Review of Systems Review of Systems  Constitutional: Negative for appetite change and  fatigue.  HENT: Negative for congestion, ear discharge and sinus pressure.   Eyes: Negative for discharge.  Respiratory: Negative for cough.   Cardiovascular: Negative for chest pain.  Gastrointestinal: Negative for abdominal pain and diarrhea.  Genitourinary: Negative for frequency and hematuria.  Musculoskeletal: Positive for back pain and myalgias.  Skin: Negative for rash.  Neurological: Negative for seizures, numbness and headaches.  Psychiatric/Behavioral: Negative for hallucinations.     Physical Exam Updated Vital Signs BP (!) 159/92 (BP Location: Right Arm)   Pulse 64    Resp 20   Ht 6' (1.829 m)   Wt 170 lb (77.1 kg)   SpO2 100%   BMI 23.06 kg/m   Physical Exam  Constitutional: He is oriented to person, place, and time. He appears well-developed.  HENT:  Head: Normocephalic.  Eyes: Conjunctivae and EOM are normal. No scleral icterus.  Neck: Neck supple. No thyromegaly present.  Cardiovascular: Normal rate and regular rhythm.  Exam reveals no gallop and no friction rub.   No murmur heard. Pulmonary/Chest: No stridor. He has no wheezes. He has no rales. He exhibits no tenderness.  Abdominal: He exhibits no distension. There is no tenderness. There is no rebound.  Musculoskeletal: Normal range of motion. He exhibits tenderness. He exhibits no edema.  Moderate lumbar spine tenderness. Positive straight leg raise bilaterally.   Lymphadenopathy:    He has no cervical adenopathy.  Neurological: He is oriented to person, place, and time. He exhibits normal muscle tone. Coordination normal.  Skin: No rash noted. No erythema.  Psychiatric: He has a normal mood and affect. His behavior is normal.  Nursing note and vitals reviewed.    ED Treatments / Results  DIAGNOSTIC STUDIES:  Oxygen Saturation is 100% on RA, normal by my interpretation.    COORDINATION OF CARE:  10:28 AM Discussed treatment plan with pt at bedside and pt agreed to plan.  Labs (all labs ordered are listed, but only abnormal results are displayed) Labs Reviewed  CBG MONITORING, ED - Abnormal; Notable for the following:       Result Value   Glucose-Capillary 353 (*)    All other components within normal limits    EKG  EKG Interpretation None       Radiology No results found.  Procedures Procedures (including critical care time)  Medications Ordered in ED Medications - No data to display   Initial Impression / Assessment and Plan / ED Course  I have reviewed the triage vital signs and the nursing notes.  Pertinent labs & imaging results that were available  during my care of the patient were reviewed by me and considered in my medical decision making (see chart for details).     Patient with low back pain. Labs unremarkable except for elevated sugar. Suspect musculoskeletal pain. Will place patient on Flexeril and Roxicodone. He'll follow-up next week for recheck  Final Clinical Impressions(s) / ED Diagnoses   Final diagnoses:  None    New Prescriptions New Prescriptions   No medications on file  The chart was scribed for me under my direct supervision.  I personally performed the history, physical, and medical decision making and all procedures in the evaluation of this patient.Milton Ferguson, MD 12/18/16 [phone]

## 2016-12-18 NOTE — ED Notes (Signed)
Pt in radiology 

## 2016-12-26 ENCOUNTER — Emergency Department (HOSPITAL_COMMUNITY)
Admission: EM | Admit: 2016-12-26 | Discharge: 2016-12-26 | Disposition: A | Discharge status: Home / Self Care | Payer: Self-pay | Attending: Emergency Medicine | Admitting: Emergency Medicine

## 2016-12-26 ENCOUNTER — Encounter (HOSPITAL_COMMUNITY): Payer: Self-pay | Admitting: *Deleted

## 2016-12-26 DIAGNOSIS — M5441 Lumbago with sciatica, right side: Secondary | ICD-10-CM | POA: Insufficient documentation

## 2016-12-26 DIAGNOSIS — Z7984 Long term (current) use of oral hypoglycemic drugs: Secondary | ICD-10-CM | POA: Insufficient documentation

## 2016-12-26 DIAGNOSIS — M5431 Sciatica, right side: Secondary | ICD-10-CM

## 2016-12-26 DIAGNOSIS — M5432 Sciatica, left side: Secondary | ICD-10-CM

## 2016-12-26 DIAGNOSIS — M5442 Lumbago with sciatica, left side: Secondary | ICD-10-CM | POA: Insufficient documentation

## 2016-12-26 DIAGNOSIS — I10 Essential (primary) hypertension: Secondary | ICD-10-CM | POA: Insufficient documentation

## 2016-12-26 DIAGNOSIS — F17210 Nicotine dependence, cigarettes, uncomplicated: Secondary | ICD-10-CM | POA: Insufficient documentation

## 2016-12-26 DIAGNOSIS — E119 Type 2 diabetes mellitus without complications: Secondary | ICD-10-CM | POA: Insufficient documentation

## 2016-12-26 MED ORDER — IBUPROFEN 600 MG PO TABS: 600.0000 mg | ORAL_TABLET | Freq: Three times a day (TID) | ORAL | 0 refills | Status: DC | PRN

## 2016-12-26 MED ORDER — KETOROLAC TROMETHAMINE 60 MG/2ML IM SOLN
60.0000 mg | Freq: Once | INTRAMUSCULAR | Status: AC
  Administered 2016-12-26: 60 mg via INTRAMUSCULAR
  Filled 2016-12-26: qty 2

## 2016-12-26 MED ORDER — PREDNISONE 10 MG PO TABS: ORAL_TABLET | ORAL | 0 refills | Status: DC

## 2016-12-26 NOTE — Discharge Instructions (Signed)
Continue your flexeril as directed.  Alternate ice and heat to your back.  Take the prednisone as directed, but stop taking it if your blood sugars become elevated.  Contact one of the clinics listed to establish primary care.

## 2016-12-26 NOTE — ED Triage Notes (Addendum)
Pt c/o lower back pain that radiates down into bilateral lower legs for over a week. Pt reports he was seen here last Tuesday and told that he had bad arthritis in his back. Pt reports he didn't give it time to heal and went back to work earlier than he should have.

## 2016-12-26 NOTE — ED Notes (Signed)
Pt made aware to return if symptoms worsen or if any life threatening symptoms occur.   

## 2016-12-28 NOTE — ED Provider Notes (Signed)
[place] DEPT Provider Note   CSN: [id number] Arrival date & time: 12/26/16  1222     History   Chief Complaint Chief Complaint  Patient presents with  . Back Pain    HPI Danny [name] is a 51 y.o. male.  HPI   Danny [name] is a 51 y.o. male who presents to the Emergency Department complaining of persistent low back pain.  He was seen here last week for low back pain and reports receiving an injection and prescription for percocet which helped his pain temporarily, but states the pain worsened after he prematurely returned to work.  He describes a throbbing pain to lower back and pain radiates into both lower legs.  Pain worse with weight bearing and bending over.  He denies abd pain, urine or bowel changes, numbness or weakness of the lower extremities.     Past Medical History:  Diagnosis Date  . Anxiety   . Back pain   . CVA (cerebral infarction)   . Depression   . Diabetes mellitus   . Hypertension   . Stroke [facility])     Patient Active Problem List   Diagnosis Date Noted  . GREATER TROCHANTERIC BURSITIS 07/03/2007  . INCREASED BLOOD PRESSURE 07/03/2007  . BACK PAIN, LUMBAR 05/29/2007  . NUMBNESS 05/29/2007  . INSOMNIA, CHRONIC 05/13/2007  . HYPERLIPIDEMIA 01/13/2007  . ANXIETY DEPRESSION 01/13/2007  . SYNDROME, CHRONIC PAIN 01/13/2007  . CVA 01/13/2007  . ARTHRITIS 01/13/2007    Past Surgical History:  Procedure Laterality Date  . FOOT SURGERY    . MANDIBLE SURGERY         Home Medications    Prior to Admission medications   Medication Sig Start Date End Date Taking? Authorizing Provider  cyclobenzaprine (FLEXERIL) 10 MG tablet Take 1 tablet (10 mg total) by mouth 3 (three) times daily as needed for muscle spasms. 12/18/16   Milton Ferguson, MD  ibuprofen (ADVIL,MOTRIN) 600 MG tablet Take 1 tablet (600 mg total) by mouth every 8 (eight) hours as needed. Take with food 12/26/16   Grason Brailsford, PA-C  metFORMIN (GLUCOPHAGE) 500 MG tablet Take  1 tablet (500 mg total) by mouth 2 (two) times daily with a meal. Patient taking differently: Take 500 mg by mouth daily.  08/21/16   Nat Christen, MD  oxyCODONE (ROXICODONE) 5 MG immediate release tablet 1 every 4-6 hours for pain 12/18/16   Milton Ferguson, MD  predniSONE (DELTASONE) 10 MG tablet Take 6 tablets day one, 5 tablets day two, 4 tablets day three, 3 tablets day four, 2 tablets day five, then 1 tablet day six 12/26/16   Kem Parkinson, PA-C    Family History Family History  Problem Relation Age of Onset  . Stroke Father   . Stroke Sister     Social History Social History  Substance Use Topics  . Smoking status: Current Every Day Smoker    Packs/day: 0.50    Years: 20.00    Types: Cigarettes  . Smokeless tobacco: Never Used  . Alcohol use Yes     Comment: occ     Allergies   Ultram [tramadol] and Benadryl [diphenhydramine]   Review of Systems Review of Systems  Constitutional: Negative for fever.  Respiratory: Negative for shortness of breath.   Gastrointestinal: Negative for abdominal pain, constipation and vomiting.  Genitourinary: Negative for decreased urine volume, difficulty urinating, dysuria, flank pain and hematuria.  Musculoskeletal: Positive for back pain. Negative for joint swelling.  Skin: Negative for rash.  Neurological: Negative for weakness and numbness.  All other systems reviewed and are negative.    Physical Exam Updated Vital Signs BP (!) 163/82 (BP Location: Left Arm)   Pulse 95   Temp 97.8 F (36.6 C) (Oral)   Resp 18   Ht 6' (1.829 m)   Wt 77.1 kg   SpO2 95%   BMI 23.06 kg/m   Physical Exam  Constitutional: He is oriented to person, place, and time. He appears well-developed and well-nourished. No distress.  HENT:  Head: Normocephalic and atraumatic.  Neck: Normal range of motion. Neck supple.  Cardiovascular: Normal rate, regular rhythm, normal heart sounds and intact distal pulses.   No murmur heard. Pulmonary/Chest:  Effort normal and breath sounds normal. No respiratory distress.  Abdominal: Soft. He exhibits no distension. There is no tenderness.  Musculoskeletal: He exhibits tenderness. He exhibits no edema.       Lumbar back: He exhibits tenderness and pain. He exhibits normal range of motion, no swelling, no deformity, no laceration and normal pulse.  ttp of the lower lumbar spine and bilateral lumbar paraspinal muscles. Pt has 5/5 strength against resistance of bilateral lower extremities.     Neurological: He is alert and oriented to person, place, and time. He has normal strength. No sensory deficit. He exhibits normal muscle tone. Coordination and gait normal.  Reflex Scores:      Patellar reflexes are 2+ on the right side and 2+ on the left side.      Achilles reflexes are 2+ on the right side and 2+ on the left side. Skin: Skin is warm and dry. No rash noted.  Nursing note and vitals reviewed.    ED Treatments / Results  Labs (all labs ordered are listed, but only abnormal results are displayed) Labs Reviewed - No data to display  EKG  EKG Interpretation None       Radiology No results found.  Procedures Procedures (including critical care time)  Medications Ordered in ED Medications  ketorolac (TORADOL) injection 60 mg (60 mg Intramuscular Given 12/26/16 1349)     Initial Impression / Assessment and Plan / ED Course  I have reviewed the triage vital signs and the nursing notes.  Pertinent labs & imaging results that were available during my care of the patient were reviewed by me and considered in my medical decision making (see chart for details).     L spine film from 12/18/16 shows deg changes w/o subluxation or fx.   Pt ambulatory,  No focal neuro deficits.  Non-toxic appearing.  Chronic low back pain.  No concerning sx's for emergent neurological process.  Will treat with steroids, pt has muscle relaxer at home.  Advised to establish PCP, referral info  given.   Final Clinical Impressions(s) / ED Diagnoses   Final diagnoses:  Bilateral sciatica    New Prescriptions Discharge Medication List as of 12/26/2016  1:47 PM    START taking these medications   Details  predniSONE (DELTASONE) 10 MG tablet Take 6 tablets day one, 5 tablets day two, 4 tablets day three, 3 tablets day four, 2 tablets day five, then 1 tablet day six, Print         Kem Parkinson, PA-C 12/28/16 2152    Francine Graven, DO 12/29/16 1652

## 2017-04-01 ENCOUNTER — Emergency Department (HOSPITAL_COMMUNITY)
Admission: EM | Admit: 2017-04-01 | Discharge: 2017-04-01 | Disposition: A | Discharge status: Home / Self Care | Payer: Self-pay | Attending: Emergency Medicine | Admitting: Emergency Medicine

## 2017-04-01 ENCOUNTER — Emergency Department (HOSPITAL_COMMUNITY): Payer: Self-pay

## 2017-04-01 ENCOUNTER — Encounter (HOSPITAL_COMMUNITY): Payer: Self-pay

## 2017-04-01 DIAGNOSIS — E119 Type 2 diabetes mellitus without complications: Secondary | ICD-10-CM | POA: Insufficient documentation

## 2017-04-01 DIAGNOSIS — Z8673 Personal history of transient ischemic attack (TIA), and cerebral infarction without residual deficits: Secondary | ICD-10-CM | POA: Insufficient documentation

## 2017-04-01 DIAGNOSIS — R079 Chest pain, unspecified: Secondary | ICD-10-CM | POA: Insufficient documentation

## 2017-04-01 DIAGNOSIS — F439 Reaction to severe stress, unspecified: Secondary | ICD-10-CM | POA: Insufficient documentation

## 2017-04-01 DIAGNOSIS — I10 Essential (primary) hypertension: Secondary | ICD-10-CM | POA: Insufficient documentation

## 2017-04-01 DIAGNOSIS — F17210 Nicotine dependence, cigarettes, uncomplicated: Secondary | ICD-10-CM | POA: Insufficient documentation

## 2017-04-01 DIAGNOSIS — Z79899 Other long term (current) drug therapy: Secondary | ICD-10-CM | POA: Insufficient documentation

## 2017-04-01 DIAGNOSIS — G44209 Tension-type headache, unspecified, not intractable: Secondary | ICD-10-CM | POA: Insufficient documentation

## 2017-04-01 DIAGNOSIS — Z7984 Long term (current) use of oral hypoglycemic drugs: Secondary | ICD-10-CM | POA: Insufficient documentation

## 2017-04-01 LAB — BASIC METABOLIC PANEL
Anion gap: 10 (ref 5–15)
BUN: 9 mg/dL (ref 6–20)
CALCIUM: 9.5 mg/dL (ref 8.9–10.3)
CO2: 21 mmol/L — AB (ref 22–32)
CREATININE: 0.92 mg/dL (ref 0.61–1.24)
Chloride: 102 mmol/L (ref 101–111)
GFR calc Af Amer: >60 mL/min (ref >60)
GLUCOSE: 268 mg/dL — AB (ref 65–99)
Potassium: 4 mmol/L (ref 3.5–5.1)
Sodium: 133 mmol/L — ABNORMAL LOW (ref 135–145)

## 2017-04-01 LAB — CBC
HCT: 47.8 % (ref 39.0–52.0)
Hemoglobin: 17.1 g/dL — ABNORMAL HIGH (ref 13.0–17.0)
MCH: 31.8 pg (ref 26.0–34.0)
MCHC: 35.8 g/dL (ref 30.0–36.0)
MCV: 89 fL (ref 78.0–100.0)
PLATELETS: 185 10*3/uL (ref 150–400)
RBC: 5.37 MIL/uL (ref 4.22–5.81)
RDW: 12.4 % (ref 11.5–15.5)
WBC: 9.8 10*3/uL (ref 4.0–10.5)

## 2017-04-01 LAB — I-STAT TROPONIN, ED: TROPONIN I, POC: 0 ng/mL (ref 0.00–0.08)

## 2017-04-01 MED ORDER — PROCHLORPERAZINE EDISYLATE 5 MG/ML IJ SOLN
10.0000 mg | Freq: Once | INTRAMUSCULAR | Status: AC
  Administered 2017-04-01: 10 mg via INTRAVENOUS
  Filled 2017-04-01: qty 2

## 2017-04-01 MED ORDER — ACETAMINOPHEN 325 MG PO TABS
ORAL_TABLET | ORAL | Status: AC
  Filled 2017-04-01: qty 2

## 2017-04-01 MED ORDER — KETAMINE HCL 10 MG/ML IJ SOLN
0.1500 mg/kg | Freq: Once | INTRAMUSCULAR | Status: AC
  Administered 2017-04-01: 11 mg via INTRAVENOUS

## 2017-04-01 MED ORDER — KETAMINE HCL 10 MG/ML IJ SOLN
INTRAMUSCULAR | Status: AC
  Administered 2017-04-01: 11 mg via INTRAVENOUS
  Filled 2017-04-01: qty 1

## 2017-04-01 MED ORDER — ACETAMINOPHEN 325 MG PO TABS
650.0000 mg | ORAL_TABLET | Freq: Once | ORAL | Status: AC
  Administered 2017-04-01: 650 mg via ORAL

## 2017-04-01 MED ORDER — HYDROXYZINE HCL 25 MG PO TABS: 25.0000 mg | ORAL_TABLET | Freq: Four times a day (QID) | ORAL | 0 refills | Status: DC | PRN

## 2017-04-01 NOTE — ED Provider Notes (Signed)
[place] DEPT Provider Note   CSN: [id number] Arrival date & time: 04/01/17  1104     History   Chief Complaint Chief Complaint  Patient presents with  . Chest Pain    HPI Danny [name] is a 51 y.o. male. He complains of chest pain and headache, for 2 days, because of stress, related to his father moving in with him recently.  He continually argues with his father, and his father continually puts him down.  This makes him tearful and sad.  He has history of anxiety, treated with Xanax, but has been off this medicine for a while.  He denies diaphoresis, nausea, shortness of breath, weakness or dizziness.  There are no other known modifying factors.  HPI  Past Medical History:  Diagnosis Date  . Anxiety   . Back pain   . CVA (cerebral infarction)   . Depression   . Diabetes mellitus   . Hypertension   . Stroke [facility])     Patient Active Problem List   Diagnosis Date Noted  . GREATER TROCHANTERIC BURSITIS 07/03/2007  . INCREASED BLOOD PRESSURE 07/03/2007  . BACK PAIN, LUMBAR 05/29/2007  . NUMBNESS 05/29/2007  . INSOMNIA, CHRONIC 05/13/2007  . HYPERLIPIDEMIA 01/13/2007  . ANXIETY DEPRESSION 01/13/2007  . SYNDROME, CHRONIC PAIN 01/13/2007  . CVA 01/13/2007  . ARTHRITIS 01/13/2007    Past Surgical History:  Procedure Laterality Date  . FOOT SURGERY    . MANDIBLE SURGERY         Home Medications    Prior to Admission medications   Medication Sig Start Date End Date Taking? Authorizing Provider  ibuprofen (ADVIL,MOTRIN) 600 MG tablet Take 1 tablet (600 mg total) by mouth every 8 (eight) hours as needed. Take with food 12/26/16  Yes Triplett, Tammy, PA-C  MELATONIN PO Take 5 mg by mouth at bedtime.   Yes [provider]  metFORMIN (GLUCOPHAGE) 500 MG tablet Take 1 tablet (500 mg total) by mouth 2 (two) times daily with a meal. Patient taking differently: Take 500 mg by mouth daily.  08/21/16  Yes Nat Christen, MD  cyclobenzaprine (FLEXERIL) 10 MG  tablet Take 1 tablet (10 mg total) by mouth 3 (three) times daily as needed for muscle spasms. Patient not taking: Reported on 04/01/2017 12/18/16   Milton Ferguson, MD  hydrOXYzine (ATARAX/VISTARIL) 25 MG tablet Take 1 tablet (25 mg total) by mouth every 6 (six) hours as needed for anxiety. 04/01/17   Daleen Bo, MD  oxyCODONE (ROXICODONE) 5 MG immediate release tablet 1 every 4-6 hours for pain Patient not taking: Reported on 04/01/2017 12/18/16   Milton Ferguson, MD  predniSONE (DELTASONE) 10 MG tablet Take 6 tablets day one, 5 tablets day two, 4 tablets day three, 3 tablets day four, 2 tablets day five, then 1 tablet day six Patient not taking: Reported on 04/01/2017 12/26/16   Kem Parkinson, PA-C    Family History Family History  Problem Relation Age of Onset  . Stroke Father   . Stroke Sister     Social History Social History  Substance Use Topics  . Smoking status: Current Every Day Smoker    Packs/day: 1.00    Years: 20.00    Types: Cigarettes  . Smokeless tobacco: Never Used  . Alcohol use Yes     Comment: occ     Allergies   Ultram [tramadol] and Benadryl [diphenhydramine]   Review of Systems Review of Systems  All other systems reviewed and are negative.    Physical  Exam Updated Vital Signs BP (!) 147/84   Pulse 85   Resp (!) 21   Ht 6' (1.829 m)   Wt 72.6 kg (160 lb)   SpO2 99%   BMI 21.70 kg/m   Physical Exam  Constitutional: He is oriented to person, place, and time. He appears well-developed and well-nourished. He appears distressed (Crying).  HENT:  Head: Normocephalic and atraumatic.  Right Ear: External ear normal.  Left Ear: External ear normal.  Eyes: Pupils are equal, round, and reactive to light. Conjunctivae and EOM are normal.  Neck: Normal range of motion and phonation normal. Neck supple.  Cardiovascular: Normal rate, regular rhythm and normal heart sounds.   Pulmonary/Chest: Effort normal and breath sounds normal. He exhibits no bony  tenderness.  Abdominal: Soft. There is no tenderness.  Musculoskeletal: Normal range of motion.  Neurological: He is alert and oriented to person, place, and time. No cranial nerve deficit or sensory deficit. He exhibits normal muscle tone. Coordination normal.  Skin: Skin is warm, dry and intact.  Psychiatric: His behavior is normal. Judgment and thought content normal.  Appears depressed  Nursing note and vitals reviewed.    ED Treatments / Results  Labs (all labs ordered are listed, but only abnormal results are displayed) Labs Reviewed  BASIC METABOLIC PANEL - Abnormal; Notable for the following:       Result Value   Sodium 133 (*)    CO2 21 (*)    Glucose, Bld 268 (*)    All other components within normal limits  CBC - Abnormal; Notable for the following:    Hemoglobin 17.1 (*)    All other components within normal limits  I-STAT TROPONIN, ED    EKG  EKG Interpretation  Date/Time:  Tuesday April 01 2017 11:17:43 EDT Ventricular Rate:  104 PR Interval:    QRS Duration: 96 QT Interval:  325 QTC Calculation: 428 R Axis:   53 Text Interpretation:  Sinus tachycardia LAE, consider biatrial enlargement Low voltage, extremity leads Baseline wander in lead(s) II III aVF since last tracing no significant change Confirmed by Daleen Bo [id number]) on 04/01/2017 11:21:48 AM       Radiology Dg Chest 2 View  Result Date: 04/01/2017 CLINICAL DATA:  Chest pain, shortness of breath. EXAM: CHEST  2 VIEW COMPARISON:  Radiographs of August 21, 2016. FINDINGS: The heart size and mediastinal contours are within normal limits. Both lungs are clear. No pneumothorax or pleural effusion is noted. The visualized skeletal structures are unremarkable. IMPRESSION: No active cardiopulmonary disease. Electronically Signed   By: Marijo Conception, M.D.   On: 04/01/2017 12:15    Procedures Procedures (including critical care time)  Medications Ordered in ED Medications  acetaminophen (TYLENOL)  325 MG tablet (not administered)  acetaminophen (TYLENOL) tablet 650 mg (650 mg Oral Given 04/01/17 1218)  ketamine (KETALAR) injection 11 mg (11 mg Intravenous Given 04/01/17 1346)  prochlorperazine (COMPAZINE) injection 10 mg (10 mg Intravenous Given 04/01/17 1343)     Initial Impression / Assessment and Plan / ED Course  I have reviewed the triage vital signs and the nursing notes.  Pertinent labs & imaging results that were available during my care of the patient were reviewed by me and considered in my medical decision making (see chart for details).      Patient Vitals for the past 24 hrs:  BP Pulse Resp SpO2 Height Weight  04/01/17 1200 (!) 147/84 85 (!) 21 99 % - -  04/01/17 1148 137/82  89 18 98 % - -  04/01/17 1113 - - - - 6' (1.829 m) 72.6 kg (160 lb)    2:32 PM Reevaluation with update and discussion. After initial assessment and treatment, an updated evaluation reveals he is more comfortable now.  Findings discussed with patient and wife, all questions answered. Tia Gelb L    Final Clinical Impressions(s) / ED Diagnoses   Final diagnoses:  Tension-type headache, not intractable, unspecified chronicity pattern  Nonspecific chest pain  Stress    Evaluation consistent with related headache and chest pain.  Doubt ACS, PE, CVA, metabolic instability.  Nursing Notes Reviewed/ Care Coordinated Applicable Imaging Reviewed Interpretation of Laboratory Data incorporated into ED treatment  The patient appears reasonably screened and/or stabilized for discharge and I doubt any other medical condition or other [facility] requiring further screening, evaluation, or treatment in the ED at this time prior to discharge.  Plan: Home Medications-continue usual; Home Treatments-rest, fluids; return here if the recommended treatment, does not improve the symptoms; Recommended follow up-PCP as needed.  Follow-up at a counseling center such as [facility] for further evaluation and  treatment.   New Prescriptions New Prescriptions   HYDROXYZINE (ATARAX/VISTARIL) 25 MG TABLET    Take 1 tablet (25 mg total) by mouth every 6 (six) hours as needed for anxiety.     Daleen Bo, MD 04/01/17 1435

## 2017-04-01 NOTE — Discharge Instructions (Signed)
It is important to follow-up for treatment at a facility such as [facility], to work on Child psychotherapist and anxiety.  Try to reduce the stressors in your life.

## 2017-04-01 NOTE — ED Notes (Signed)
Wife reports that they are living in an extremely stressful situation.

## 2017-04-01 NOTE — ED Triage Notes (Signed)
Patient states that he started having chest pain yesterday that is constant, sharp, pressure, and squeezing.  States that he has a headache, has been vomiting some, and the chest pain goes into his left arm.

## 2017-06-24 ENCOUNTER — Emergency Department (HOSPITAL_COMMUNITY): Payer: Self-pay

## 2017-06-24 ENCOUNTER — Emergency Department (HOSPITAL_COMMUNITY)
Admission: EM | Admit: 2017-06-24 | Discharge: 2017-06-24 | Disposition: A | Discharge status: Home Health | Payer: Self-pay | Attending: Emergency Medicine | Admitting: Emergency Medicine

## 2017-06-24 ENCOUNTER — Encounter (HOSPITAL_COMMUNITY): Payer: Self-pay | Admitting: *Deleted

## 2017-06-24 DIAGNOSIS — Z794 Long term (current) use of insulin: Secondary | ICD-10-CM | POA: Insufficient documentation

## 2017-06-24 DIAGNOSIS — M5441 Lumbago with sciatica, right side: Secondary | ICD-10-CM | POA: Insufficient documentation

## 2017-06-24 DIAGNOSIS — E119 Type 2 diabetes mellitus without complications: Secondary | ICD-10-CM | POA: Insufficient documentation

## 2017-06-24 DIAGNOSIS — M79604 Pain in right leg: Secondary | ICD-10-CM | POA: Insufficient documentation

## 2017-06-24 DIAGNOSIS — F17210 Nicotine dependence, cigarettes, uncomplicated: Secondary | ICD-10-CM | POA: Insufficient documentation

## 2017-06-24 DIAGNOSIS — I10 Essential (primary) hypertension: Secondary | ICD-10-CM | POA: Insufficient documentation

## 2017-06-24 HISTORY — DX: Migraine, unspecified, not intractable, without status migrainosus: G43.909

## 2017-06-24 MED ORDER — HYDROCODONE-ACETAMINOPHEN 5-325 MG PO TABS: 2.0000 | ORAL_TABLET | ORAL | 0 refills | Status: DC | PRN

## 2017-06-24 MED ORDER — HYDROMORPHONE HCL 1 MG/ML IJ SOLN
1.0000 mg | Freq: Once | INTRAMUSCULAR | Status: AC
  Administered 2017-06-24: 1 mg via INTRAMUSCULAR
  Filled 2017-06-24: qty 1

## 2017-06-24 MED ORDER — KETOROLAC TROMETHAMINE 60 MG/2ML IM SOLN
60.0000 mg | Freq: Once | INTRAMUSCULAR | Status: AC
  Administered 2017-06-24: 60 mg via INTRAMUSCULAR
  Filled 2017-06-24: qty 2

## 2017-06-24 MED ORDER — PREDNISONE 10 MG PO TABS: ORAL_TABLET | ORAL | 0 refills | Status: DC

## 2017-06-24 NOTE — Discharge Instructions (Signed)
See your Physician for recheck in 3-4 days.  Monitor glucose closely

## 2017-06-24 NOTE — ED Triage Notes (Signed)
Pt c/o pain from right buttock that radiates down right leg into foot that started last night. Denies injury. Pt has taken Ibuprofen without relief of pain.

## 2017-06-25 NOTE — ED Provider Notes (Signed)
[facility] EMERGENCY DEPARTMENT Provider Note   CSN: [id number] Arrival date & time: 06/24/17  1329     History   Chief Complaint Chief Complaint  Patient presents with  . Leg Pain    HPI Danny [name] is a 51 y.o. male.  The history is provided by the patient. No language interpreter was used.  Leg Pain   This is a new problem. The current episode started yesterday. The problem occurs constantly. The problem has been gradually worsening. The pain is present in the back. The quality of the pain is described as aching. The pain is at a severity of 9/10. The pain is severe. Associated symptoms include limited range of motion. He has tried nothing for the symptoms. The treatment provided no relief.  Pt complains of pain in his right leg and his low back.  Pt reports pain radiates from buttock down.  No injury.   Past Medical History:  Diagnosis Date  . Anxiety   . Back pain   . CVA (cerebral infarction)   . Depression   . Diabetes mellitus   . Hypertension   . Migraines   . Stroke [facility])     Patient Active Problem List   Diagnosis Date Noted  . GREATER TROCHANTERIC BURSITIS 07/03/2007  . INCREASED BLOOD PRESSURE 07/03/2007  . BACK PAIN, LUMBAR 05/29/2007  . NUMBNESS 05/29/2007  . INSOMNIA, CHRONIC 05/13/2007  . HYPERLIPIDEMIA 01/13/2007  . ANXIETY DEPRESSION 01/13/2007  . SYNDROME, CHRONIC PAIN 01/13/2007  . CVA 01/13/2007  . ARTHRITIS 01/13/2007    Past Surgical History:  Procedure Laterality Date  . FOOT SURGERY    . MANDIBLE SURGERY         Home Medications    Prior to Admission medications   Medication Sig Start Date End Date Taking? Authorizing Provider  HYDROcodone-acetaminophen (NORCO/VICODIN) 5-325 MG tablet Take 2 tablets by mouth every 4 (four) hours as needed. 06/24/17   Fransico Meadow, PA-C  hydrOXYzine (ATARAX/VISTARIL) 25 MG tablet Take 1 tablet (25 mg total) by mouth every 6 (six) hours as needed for anxiety. 04/01/17   Daleen Bo,  MD  ibuprofen (ADVIL,MOTRIN) 600 MG tablet Take 1 tablet (600 mg total) by mouth every 8 (eight) hours as needed. Take with food 12/26/16   Triplett, Tammy, PA-C  MELATONIN PO Take 5 mg by mouth at bedtime.    [provider]  metFORMIN (GLUCOPHAGE) 500 MG tablet Take 1 tablet (500 mg total) by mouth 2 (two) times daily with a meal. Patient taking differently: Take 500 mg by mouth daily.  08/21/16   Nat Christen, MD  predniSONE (DELTASONE) 10 MG tablet 1,1,9,4,1,7 06/24/17   Fransico Meadow, PA-C    Family History Family History  Problem Relation Age of Onset  . Stroke Father   . Stroke Sister     Social History Social History  Substance Use Topics  . Smoking status: Current Every Day Smoker    Packs/day: 1.00    Years: 20.00    Types: Cigarettes  . Smokeless tobacco: Never Used  . Alcohol use Yes     Comment: occ     Allergies   Ultram [tramadol] and Benadryl [diphenhydramine]   Review of Systems Review of Systems  All other systems reviewed and are negative.    Physical Exam Updated Vital Signs BP 138/81 (BP Location: Right Arm)   Pulse 72   Temp 98 F (36.7 C) (Oral)   Resp 18   Ht 6' (1.829 m)  Wt 79.4 kg (175 lb)   SpO2 98%   BMI 23.73 kg/m   Physical Exam  Constitutional: He appears well-developed and well-nourished.  HENT:  Head: Normocephalic and atraumatic.  Eyes: Conjunctivae are normal.  Neck: Neck supple.  Cardiovascular: Normal rate.   No murmur heard. Pulmonary/Chest: Effort normal. No respiratory distress.  Abdominal: Soft. There is no tenderness.  Musculoskeletal: He exhibits no edema.  Tender ls spine,  Pain with straight leg raise right leg,  nv and ns intact  Neurological: He is alert.  Skin: Skin is warm and dry.  Psychiatric: He has a normal mood and affect.  Nursing note and vitals reviewed.    ED Treatments / Results  Labs (all labs ordered are listed, but only abnormal results are displayed) Labs Reviewed - No  data to display  EKG  EKG Interpretation None       Radiology Dg Lumbar Spine Complete  Result Date: 06/24/2017 CLINICAL DATA:  Low back pain EXAM: LUMBAR SPINE - COMPLETE 4+ VIEW COMPARISON:  Lumbar spine radiograph 12/18/2016 FINDINGS: Normal alignment. No acute fracture. Mild degenerative changes including multilevel mild interspace narrowing and lower thoracic vertebral body height loss are unchanged. IMPRESSION: No acute fracture or listhesis. Unchanged mild degenerative disease. Electronically Signed   By: Ulyses Jarred M.D.   On: 06/24/2017 14:46    Procedures Procedures (including critical care time)  Medications Ordered in ED Medications  ketorolac (TORADOL) injection 60 mg (60 mg Intramuscular Given 06/24/17 1430)  HYDROmorphone (DILAUDID) injection 1 mg (1 mg Intramuscular Given 06/24/17 1431)     Initial Impression / Assessment and Plan / ED Course  I have reviewed the triage vital signs and the nursing notes.  Pertinent labs & imaging results that were available during my care of the patient were reviewed by me and considered in my medical decision making (see chart for details).   [place] database reviewed.  Pt reports some relief with torodol and dilaudid.  Pt reports he has taken prednisone in the past. Pt reports he had only minimal increase in his glucose.   Final Clinical Impressions(s) / ED Diagnoses   Final diagnoses:  Right leg pain  Acute right-sided low back pain with right-sided sciatica   Meds ordered this encounter  Medications  . ketorolac (TORADOL) injection 60 mg  . HYDROmorphone (DILAUDID) injection 1 mg  . predniSONE (DELTASONE) 10 MG tablet    Sig: 6,5,4,3,2,1    Dispense:  21 tablet    Refill:  0    Order Specific Question:   Supervising Provider    Answer:   MILLER, BRIAN [[id number]]  . HYDROcodone-acetaminophen (NORCO/VICODIN) 5-325 MG tablet    Sig: Take 2 tablets by mouth every 4 (four) hours as needed.    Dispense:  16 tablet     Refill:  0    Order Specific Question:   Supervising Provider    Answer:   Noemi Chapel [[id number]]  An After Visit Summary was printed and given to the patient.  New Prescriptions Discharge Medication List as of 06/24/2017  3:17 PM    START taking these medications   Details  HYDROcodone-acetaminophen (NORCO/VICODIN) 5-325 MG tablet Take 2 tablets by mouth every 4 (four) hours as needed., Starting Tue 06/24/2017, Print       No outpatient prescriptions have been marked as taking for the 06/24/17 encounter [facility] Encounter).   An After Visit Summary was printed and given to the patient.    Fransico Meadow, [place] 06/25/17 1410  Merrily Pew, MD 06/26/17 1227

## 2017-11-05 ENCOUNTER — Encounter (HOSPITAL_COMMUNITY): Payer: Self-pay | Admitting: Emergency Medicine

## 2017-11-05 ENCOUNTER — Other Ambulatory Visit: Payer: Self-pay

## 2017-11-05 ENCOUNTER — Emergency Department (HOSPITAL_COMMUNITY): Payer: Medicaid Other

## 2017-11-05 ENCOUNTER — Emergency Department (HOSPITAL_COMMUNITY)
Admission: EM | Admit: 2017-11-05 | Discharge: 2017-11-05 | Disposition: A | Discharge status: Home Health | Payer: Medicaid Other | Attending: Emergency Medicine | Admitting: Emergency Medicine

## 2017-11-05 DIAGNOSIS — Y999 Unspecified external cause status: Secondary | ICD-10-CM | POA: Insufficient documentation

## 2017-11-05 DIAGNOSIS — Y9389 Activity, other specified: Secondary | ICD-10-CM | POA: Diagnosis not present

## 2017-11-05 DIAGNOSIS — S9031XA Contusion of right foot, initial encounter: Secondary | ICD-10-CM | POA: Insufficient documentation

## 2017-11-05 DIAGNOSIS — Z7984 Long term (current) use of oral hypoglycemic drugs: Secondary | ICD-10-CM | POA: Diagnosis not present

## 2017-11-05 DIAGNOSIS — S99921A Unspecified injury of right foot, initial encounter: Secondary | ICD-10-CM | POA: Diagnosis present

## 2017-11-05 DIAGNOSIS — Y929 Unspecified place or not applicable: Secondary | ICD-10-CM | POA: Insufficient documentation

## 2017-11-05 DIAGNOSIS — W208XXA Other cause of strike by thrown, projected or falling object, initial encounter: Secondary | ICD-10-CM | POA: Insufficient documentation

## 2017-11-05 DIAGNOSIS — Z79899 Other long term (current) drug therapy: Secondary | ICD-10-CM | POA: Diagnosis not present

## 2017-11-05 DIAGNOSIS — I10 Essential (primary) hypertension: Secondary | ICD-10-CM | POA: Diagnosis not present

## 2017-11-05 DIAGNOSIS — F17210 Nicotine dependence, cigarettes, uncomplicated: Secondary | ICD-10-CM | POA: Diagnosis not present

## 2017-11-05 DIAGNOSIS — E119 Type 2 diabetes mellitus without complications: Secondary | ICD-10-CM | POA: Insufficient documentation

## 2017-11-05 MED ORDER — HYDROCODONE-ACETAMINOPHEN 5-325 MG PO TABS: 1.0000 | ORAL_TABLET | ORAL | 0 refills | Status: DC | PRN

## 2017-11-05 MED ORDER — IBUPROFEN 600 MG PO TABS: 600.0000 mg | ORAL_TABLET | Freq: Four times a day (QID) | ORAL | 0 refills | Status: DC | PRN

## 2017-11-05 MED ORDER — IBUPROFEN 800 MG PO TABS
800.0000 mg | ORAL_TABLET | Freq: Once | ORAL | Status: AC
  Administered 2017-11-05: 800 mg via ORAL
  Filled 2017-11-05: qty 1

## 2017-11-05 MED ORDER — HYDROCODONE-ACETAMINOPHEN 5-325 MG PO TABS
1.0000 | ORAL_TABLET | Freq: Once | ORAL | Status: AC
  Administered 2017-11-05: 1 via ORAL
  Filled 2017-11-05: qty 1

## 2017-11-05 NOTE — Discharge Instructions (Signed)
Use ice and elevation as much as possible for the next several days.  Use the crutches until you can comfortably weight bear.  You may take the hydrocodone prescribed for pain relief.  This will make you drowsy - do not drive within 4 hours of taking this medication.

## 2017-11-05 NOTE — ED Triage Notes (Signed)
Pt dropped microwave onto top of foot. Abrasion noted.

## 2017-11-05 NOTE — ED Provider Notes (Signed)
[facility] EMERGENCY DEPARTMENT Provider Note   CSN: [id number] Arrival date & time: 11/05/17  1407     History   Chief Complaint Chief Complaint  Patient presents with  . Foot Pain    HPI Danny [name] is a 52 y.o. male presenting with pain to his dorsal right foot since dropping a microwave oven on the foot prior to arrival.  He describes severe pain and inability to weight bear.  He denies any other injury.  He has had no medicines prior to arrival.  He does report a history of distant achilles tendon repair of the same foot, but denies pain at this site.  The history is provided by the patient.    Past Medical History:  Diagnosis Date  . Anxiety   . Back pain   . CVA (cerebral infarction)   . Depression   . Diabetes mellitus   . Hypertension   . Migraines   . Stroke [facility])     Patient Active Problem List   Diagnosis Date Noted  . GREATER TROCHANTERIC BURSITIS 07/03/2007  . INCREASED BLOOD PRESSURE 07/03/2007  . BACK PAIN, LUMBAR 05/29/2007  . NUMBNESS 05/29/2007  . INSOMNIA, CHRONIC 05/13/2007  . HYPERLIPIDEMIA 01/13/2007  . ANXIETY DEPRESSION 01/13/2007  . SYNDROME, CHRONIC PAIN 01/13/2007  . CVA 01/13/2007  . ARTHRITIS 01/13/2007    Past Surgical History:  Procedure Laterality Date  . FOOT SURGERY    . MANDIBLE SURGERY         Home Medications    Prior to Admission medications   Medication Sig Start Date End Date Taking? Authorizing Provider  HYDROcodone-acetaminophen (NORCO/VICODIN) 5-325 MG tablet Take 1 tablet by mouth every 4 (four) hours as needed. 11/05/17   Evalee Jefferson, PA-C  hydrOXYzine (ATARAX/VISTARIL) 25 MG tablet Take 1 tablet (25 mg total) by mouth every 6 (six) hours as needed for anxiety. 04/01/17   Daleen Bo, MD  ibuprofen (ADVIL,MOTRIN) 600 MG tablet Take 1 tablet (600 mg total) by mouth every 6 (six) hours as needed. 11/05/17   Evalee Jefferson, PA-C  MELATONIN PO Take 5 mg by mouth at bedtime.    [provider]    metFORMIN (GLUCOPHAGE) 500 MG tablet Take 1 tablet (500 mg total) by mouth 2 (two) times daily with a meal. Patient taking differently: Take 500 mg by mouth daily.  08/21/16   Nat Christen, MD  predniSONE (DELTASONE) 10 MG tablet 6,5,4,3,2,1 06/24/17   Fransico Meadow, PA-C  glipiZIDE (GLUCOTROL) 10 MG tablet Take 1 tablet (10 mg total) by mouth 2 (two) times daily before a meal. Patient not taking: Reported on 10/05/2014 60/73/71 0/62/69  Delora Fuel, MD    Family History Family History  Problem Relation Age of Onset  . Stroke Father   . Stroke Sister     Social History Social History   Tobacco Use  . Smoking status: Current Every Day Smoker    Packs/day: 1.00    Years: 20.00    Pack years: 20.00    Types: Cigarettes  . Smokeless tobacco: Never Used  Substance Use Topics  . Alcohol use: Yes    Comment: occ  . Drug use: Yes    Types: Marijuana     Allergies   Ultram [tramadol] and Benadryl [diphenhydramine]   Review of Systems Review of Systems  Constitutional: Negative for fever.  Musculoskeletal: Positive for arthralgias. Negative for joint swelling and myalgias.  Neurological: Negative for weakness and numbness.     Physical Exam Updated Vital  Signs BP (!) 162/76 (BP Location: Right Arm)   Pulse (!) 115   Temp 98.3 F (36.8 C) (Oral)   Resp 20   SpO2 99%   Physical Exam  Constitutional: He appears well-developed and well-nourished.  HENT:  Head: Atraumatic.  Neck: Normal range of motion.  Cardiovascular:  Pulses equal bilaterally  Musculoskeletal: He exhibits tenderness.       Right foot: There is normal capillary refill and no deformity.       Feet:  Distal sensation intact (but endorses chronic great toe numbness at baseline).   Neurological: He is alert. He has normal strength. He displays normal reflexes. No sensory deficit.  Skin: Skin is warm and dry.  Psychiatric: He has a normal mood and affect.     ED Treatments / Results   Labs (all labs ordered are listed, but only abnormal results are displayed) Labs Reviewed - No data to display  EKG  EKG Interpretation None       Radiology Dg Foot Complete Right  Result Date: 11/05/2017 CLINICAL DATA:  Severe dorsal right foot and ankle pain after dropping a microwave on the foot today. EXAM: RIGHT FOOT COMPLETE - 3+ VIEW COMPARISON:  04/28/2014. FINDINGS: Again demonstrated are marked 1st MTP joint degenerative changes. A calcaneal fixation screw is also again demonstrated with a stable moderate-sized inferior calcaneal spur and calcification in the proximal plantar fascia and region of the distal Achilles tendon. Mild dorsal talonavicular spur formation. No fracture or dislocation seen. IMPRESSION: 1. No fracture. 2. Stable postsurgical and degenerative changes, as described above. Electronically Signed   By: Claudie Revering M.D.   On: 11/05/2017 14:37    Procedures Procedures (including critical care time)  Medications Ordered in ED Medications  ibuprofen (ADVIL,MOTRIN) tablet 800 mg (not administered)  HYDROcodone-acetaminophen (NORCO/VICODIN) 5-325 MG per tablet 1 tablet (not administered)     Initial Impression / Assessment and Plan / ED Course  I have reviewed the triage vital signs and the nursing notes.  Pertinent labs & imaging results that were available during my care of the patient were reviewed by me and considered in my medical decision making (see chart for details).     RICE, xrays discussed.  F/u with ortho prn if sx persist or worsen for evaluation of possible soft structure injury if sx persist.  Final Clinical Impressions(s) / ED Diagnoses   Final diagnoses:  Contusion of right foot, initial encounter    ED Discharge Orders        Ordered    ibuprofen (ADVIL,MOTRIN) 600 MG tablet  Every 6 hours PRN     11/05/17 1624    HYDROcodone-acetaminophen (NORCO/VICODIN) 5-325 MG tablet  Every 4 hours PRN     11/05/17 1624       Evalee Jefferson, PA-C 11/05/17 1636    Fredia Sorrow, MD 11/05/17 1721

## 2017-12-18 ENCOUNTER — Ambulatory Visit: Payer: Self-pay | Admitting: Orthopedic Surgery

## 2017-12-25 ENCOUNTER — Telehealth: Payer: Self-pay | Admitting: Orthopedic Surgery

## 2017-12-25 NOTE — Telephone Encounter (Signed)
Cecille Rubin, triage nurse at [facility] Department called to request appointment, as patient had cancelled his emergency room follow up visit due to referral being needed; states patient is at their office. Their provider is authorizing visit. Appointment offered for first available (01/16/18) due to providers being out of office beginning today.  Patient is electing to not wait until this date; therefore, primary care will refer to another provider.

## 2018-01-01 ENCOUNTER — Encounter (HOSPITAL_COMMUNITY): Payer: Self-pay | Admitting: Emergency Medicine

## 2018-01-01 ENCOUNTER — Other Ambulatory Visit: Payer: Self-pay

## 2018-01-01 ENCOUNTER — Emergency Department (HOSPITAL_COMMUNITY)
Admission: EM | Admit: 2018-01-01 | Discharge: 2018-01-01 | Disposition: A | Discharge status: Home / Self Care | Payer: Medicaid Other | Attending: Emergency Medicine | Admitting: Emergency Medicine

## 2018-01-01 ENCOUNTER — Emergency Department (HOSPITAL_COMMUNITY): Payer: Medicaid Other

## 2018-01-01 DIAGNOSIS — R748 Abnormal levels of other serum enzymes: Secondary | ICD-10-CM | POA: Diagnosis not present

## 2018-01-01 DIAGNOSIS — I10 Essential (primary) hypertension: Secondary | ICD-10-CM | POA: Insufficient documentation

## 2018-01-01 DIAGNOSIS — F17210 Nicotine dependence, cigarettes, uncomplicated: Secondary | ICD-10-CM | POA: Diagnosis not present

## 2018-01-01 DIAGNOSIS — Z7984 Long term (current) use of oral hypoglycemic drugs: Secondary | ICD-10-CM | POA: Diagnosis not present

## 2018-01-01 DIAGNOSIS — Z79899 Other long term (current) drug therapy: Secondary | ICD-10-CM | POA: Insufficient documentation

## 2018-01-01 DIAGNOSIS — M5440 Lumbago with sciatica, unspecified side: Secondary | ICD-10-CM | POA: Diagnosis not present

## 2018-01-01 DIAGNOSIS — R05 Cough: Secondary | ICD-10-CM | POA: Insufficient documentation

## 2018-01-01 DIAGNOSIS — R0602 Shortness of breath: Secondary | ICD-10-CM | POA: Diagnosis present

## 2018-01-01 DIAGNOSIS — E1165 Type 2 diabetes mellitus with hyperglycemia: Secondary | ICD-10-CM | POA: Diagnosis not present

## 2018-01-01 DIAGNOSIS — R739 Hyperglycemia, unspecified: Secondary | ICD-10-CM

## 2018-01-01 DIAGNOSIS — J069 Acute upper respiratory infection, unspecified: Secondary | ICD-10-CM | POA: Insufficient documentation

## 2018-01-01 LAB — CBC WITH DIFFERENTIAL/PLATELET
Basophils Absolute: 0.1 10*3/uL (ref 0.0–0.1)
Basophils Relative: 1 %
EOS PCT: 0 %
Eosinophils Absolute: 0 10*3/uL (ref 0.0–0.7)
HEMATOCRIT: 44.7 % (ref 39.0–52.0)
Hemoglobin: 15.2 g/dL (ref 13.0–17.0)
LYMPHS ABS: 1.3 10*3/uL (ref 0.7–4.0)
LYMPHS PCT: 15 %
MCH: 30.6 pg (ref 26.0–34.0)
MCHC: 34 g/dL (ref 30.0–36.0)
MCV: 90.1 fL (ref 78.0–100.0)
MONO ABS: 0.3 10*3/uL (ref 0.1–1.0)
Monocytes Relative: 3 %
Neutro Abs: 7.4 10*3/uL (ref 1.7–7.7)
Neutrophils Relative %: 81 %
PLATELETS: 166 10*3/uL (ref 150–400)
RBC: 4.96 MIL/uL (ref 4.22–5.81)
RDW: 12.3 % (ref 11.5–15.5)
WBC: 9.1 10*3/uL (ref 4.0–10.5)

## 2018-01-01 LAB — COMPREHENSIVE METABOLIC PANEL
ALT: 116 U/L — AB (ref 17–63)
AST: 75 U/L — ABNORMAL HIGH (ref 15–41)
Albumin: 3.7 g/dL (ref 3.5–5.0)
Alkaline Phosphatase: 117 U/L (ref 38–126)
Anion gap: 12 (ref 5–15)
BILIRUBIN TOTAL: 0.9 mg/dL (ref 0.3–1.2)
BUN: 10 mg/dL (ref 6–20)
CHLORIDE: 106 mmol/L (ref 101–111)
CO2: 18 mmol/L — ABNORMAL LOW (ref 22–32)
CREATININE: 0.89 mg/dL (ref 0.61–1.24)
Calcium: 8.9 mg/dL (ref 8.9–10.3)
Glucose, Bld: 281 mg/dL — ABNORMAL HIGH (ref 65–99)
Potassium: 3.8 mmol/L (ref 3.5–5.1)
Sodium: 136 mmol/L (ref 135–145)
TOTAL PROTEIN: 7.3 g/dL (ref 6.5–8.1)

## 2018-01-01 LAB — TROPONIN I

## 2018-01-01 LAB — BRAIN NATRIURETIC PEPTIDE: B NATRIURETIC PEPTIDE 5: 21 pg/mL (ref 0.0–100.0)

## 2018-01-01 MED ORDER — PREDNISONE 20 MG PO TABS: ORAL_TABLET | ORAL | 0 refills | Status: DC

## 2018-01-01 MED ORDER — ALBUTEROL SULFATE HFA 108 (90 BASE) MCG/ACT IN AERS
1.0000 | INHALATION_SPRAY | RESPIRATORY_TRACT | Status: DC | PRN
  Administered 2018-01-01: 2 via RESPIRATORY_TRACT

## 2018-01-01 MED ORDER — METFORMIN HCL 500 MG PO TABS: 500.0000 mg | ORAL_TABLET | Freq: Two times a day (BID) | ORAL | 2 refills | Status: DC

## 2018-01-01 MED ORDER — KETOROLAC TROMETHAMINE 60 MG/2ML IM SOLN
60.0000 mg | Freq: Once | INTRAMUSCULAR | Status: AC
  Administered 2018-01-01: 60 mg via INTRAMUSCULAR
  Filled 2018-01-01: qty 2

## 2018-01-01 MED ORDER — METHOCARBAMOL 500 MG PO TABS: 1000.0000 mg | ORAL_TABLET | Freq: Three times a day (TID) | ORAL | 0 refills | Status: DC | PRN

## 2018-01-01 MED ORDER — IBUPROFEN 600 MG PO TABS: 600.0000 mg | ORAL_TABLET | Freq: Four times a day (QID) | ORAL | 0 refills | Status: DC | PRN

## 2018-01-01 MED ORDER — METHOCARBAMOL 500 MG PO TABS
1000.0000 mg | ORAL_TABLET | Freq: Once | ORAL | Status: AC
  Administered 2018-01-01: 1000 mg via ORAL
  Filled 2018-01-01: qty 2

## 2018-01-01 NOTE — ED Triage Notes (Signed)
Pt c/o SOB since Sunday, has ran out of his inhaler. States white productive cough, generalized malaise. Denies CP. 2 albuterol tx and 125mg  Solumedrol PTA with EMS.

## 2018-01-01 NOTE — ED Notes (Signed)
Crackers nad drink given

## 2018-01-01 NOTE — ED Provider Notes (Signed)
[facility] EMERGENCY DEPARTMENT Provider Note   CSN: [id number] Arrival date & time: 01/01/18  1109     History   Chief Complaint Chief Complaint  Patient presents with  . Shortness of Breath    HPI Danny [name] is a 52 y.o. male.  HPI Patient presents with multiple complaints.  States he has had increased shortness of breath for the past 5 days.  He has had cough with intermittent green sputum production.  Endorses chills and generalized fatigue.  Patient also complains of ongoing low back pain and right foot pain.  States several months ago he dropped a microwave on his right foot.  He is continued to have pain at the site.  Patient also states he is having a "migraine".  States he is having pain in bilateral temples.  This is similar to previous migraines.  Also endorses nasal congestion.  No neck pain, focal weakness or numbness. Past Medical History:  Diagnosis Date  . Anxiety   . Back pain   . CVA (cerebral infarction)   . Depression   . Diabetes mellitus   . Hypertension   . Migraines   . Stroke [facility])     Patient Active Problem List   Diagnosis Date Noted  . GREATER TROCHANTERIC BURSITIS 07/03/2007  . INCREASED BLOOD PRESSURE 07/03/2007  . BACK PAIN, LUMBAR 05/29/2007  . NUMBNESS 05/29/2007  . INSOMNIA, CHRONIC 05/13/2007  . HYPERLIPIDEMIA 01/13/2007  . ANXIETY DEPRESSION 01/13/2007  . SYNDROME, CHRONIC PAIN 01/13/2007  . CVA 01/13/2007  . ARTHRITIS 01/13/2007    Past Surgical History:  Procedure Laterality Date  . FOOT SURGERY    . MANDIBLE SURGERY          Home Medications    Prior to Admission medications   Medication Sig Start Date End Date Taking? Authorizing Provider  hydrOXYzine (ATARAX/VISTARIL) 25 MG tablet Take 1 tablet (25 mg total) by mouth every 6 (six) hours as needed for anxiety. 04/01/17  Yes Daleen Bo, MD  MELATONIN PO Take 5 mg by mouth at bedtime.   Yes [provider]  HYDROcodone-acetaminophen (NORCO/VICODIN)  5-325 MG tablet Take 1 tablet by mouth every 4 (four) hours as needed. Patient not taking: Reported on 01/01/2018 11/05/17   Evalee Jefferson, PA-C  ibuprofen (ADVIL,MOTRIN) 600 MG tablet Take 1 tablet (600 mg total) by mouth every 6 (six) hours as needed. 01/01/18   Julianne Rice, MD  metFORMIN (GLUCOPHAGE) 500 MG tablet Take 1 tablet (500 mg total) by mouth 2 (two) times daily with a meal. 01/01/18   Julianne Rice, MD  methocarbamol (ROBAXIN) 500 MG tablet Take 2 tablets (1,000 mg total) by mouth every 8 (eight) hours as needed for muscle spasms. 01/01/18   Julianne Rice, MD  predniSONE (DELTASONE) 20 MG tablet 3 tabs po day one, then 2 po daily x 4 days 01/01/18   Julianne Rice, MD    Family History Family History  Problem Relation Age of Onset  . Stroke Father   . Stroke Sister     Social History Social History   Tobacco Use  . Smoking status: Current Every Day Smoker    Packs/day: 1.00    Years: 20.00    Pack years: 20.00    Types: Cigarettes  . Smokeless tobacco: Never Used  Substance Use Topics  . Alcohol use: Yes    Comment: occ  . Drug use: Yes    Types: Marijuana     Allergies   Ultram [tramadol] and Benadryl [diphenhydramine]  Review of Systems Review of Systems  Constitutional: Positive for chills and fatigue. Negative for fever.  HENT: Positive for congestion and sinus pressure. Negative for trouble swallowing.   Eyes: Negative for visual disturbance.  Respiratory: Positive for cough, shortness of breath and wheezing.   Cardiovascular: Negative for chest pain, palpitations and leg swelling.  Gastrointestinal: Negative for abdominal pain, diarrhea, nausea and vomiting.  Genitourinary: Negative for dysuria, flank pain, frequency and hematuria.  Musculoskeletal: Positive for arthralgias and back pain. Negative for myalgias, neck pain and neck stiffness.  Skin: Negative for rash and wound.  Neurological: Positive for headaches. Negative for dizziness,  weakness, light-headedness and numbness.  All other systems reviewed and are negative.    Physical Exam Updated Vital Signs BP (!) 163/74   Pulse 87   Temp 97.7 F (36.5 C) (Oral)   Resp (!) 30   Wt 74.4 kg (164 lb)   SpO2 96%   BMI 22.24 kg/m   Physical Exam  Constitutional: He is oriented to person, place, and time. He appears well-developed and well-nourished. No distress.  HENT:  Head: Normocephalic and atraumatic.  Mouth/Throat: Oropharynx is clear and moist. No oropharyngeal exudate.  Bilateral frontal sinus tenderness to percussion.  Eyes: Pupils are equal, round, and reactive to light. EOM are normal.  Neck: Normal range of motion. Neck supple.  No meningismus.  Cardiovascular: Regular rhythm.  Mild tachycardia.  Pulmonary/Chest: Effort normal and breath sounds normal. No stridor. No respiratory distress. He has no wheezes. He has no rales. He exhibits no tenderness.  Abdominal: Soft. Bowel sounds are normal. There is tenderness. There is no rebound and no guarding.  Enlarged liver border.  Mild tenderness to palpation at the site.  There is no rebound or guarding.  Musculoskeletal: Normal range of motion. He exhibits no edema or tenderness.  Diffuse midline lumbar tenderness to palpation.  Patient has tenderness to palpation over the dorsum of the right foot.  There is no obvious deformity.  No lower extremity swelling, asymmetry or tenderness.  Distal pulses are 2+.  Neurological: He is alert and oriented to person, place, and time.  Moves all extremities without focal deficit.  Sensation intact.  No saddle anesthesia.  Patient is tremulous  Skin: Skin is warm and dry. Capillary refill takes less than 2 seconds. No rash noted. He is not diaphoretic. No erythema.  Psychiatric:  Mildly anxious appearing  Nursing note and vitals reviewed.    ED Treatments / Results  Labs (all labs ordered are listed, but only abnormal results are displayed) Labs Reviewed    COMPREHENSIVE METABOLIC PANEL - Abnormal; Notable for the following components:      Result Value   CO2 18 (*)    Glucose, Bld 281 (*)    AST 75 (*)    ALT 116 (*)    All other components within normal limits  CBC WITH DIFFERENTIAL/PLATELET  TROPONIN I  BRAIN NATRIURETIC PEPTIDE    EKG EKG Interpretation  Date/Time:  Thursday January 01 2018 11:14:46 EDT Ventricular Rate:  103 PR Interval:    QRS Duration: 77 QT Interval:  345 QTC Calculation: 452 R Axis:   63 Text Interpretation:  Sinus tachycardia Probable left atrial enlargement Anterior infarct, old No significant change since last tracing Confirmed by Julianne Rice [phone]) on 01/01/2018 12:18:03 PM   Radiology Dg Chest 2 View  Result Date: 01/01/2018 CLINICAL DATA:  Shortness of breath EXAM: CHEST - 2 VIEW COMPARISON:  04/01/2017 FINDINGS: The heart size and mediastinal  contours are within normal limits. Both lungs are clear. The visualized skeletal structures are unremarkable. IMPRESSION: No active cardiopulmonary disease. Electronically Signed   By: Inez Catalina M.D.   On: 01/01/2018 12:16    Procedures Procedures (including critical care time)  Medications Ordered in ED Medications  albuterol (PROVENTIL HFA;VENTOLIN HFA) 108 (90 Base) MCG/ACT inhaler 1-2 puff (2 puffs Inhalation Given 01/01/18 1424)  ketorolac (TORADOL) injection 60 mg (60 mg Intramuscular Given 01/01/18 1228)  methocarbamol (ROBAXIN) tablet 1,000 mg (1,000 mg Oral Given 01/01/18 1228)     Initial Impression / Assessment and Plan / ED Course  I have reviewed the triage vital signs and the nursing notes.  Pertinent labs & imaging results that were available during my care of the patient were reviewed by me and considered in my medical decision making (see chart for details).     Tachycardia and tremulousness have improved.  Question related to albuterol x2 received en route.  Back pain and right foot pain appear to be chronic in nature.  Patient  states he is also run out of his metformin.  Will refill prescription also give short course of prednisone.  Given albuterol inhaler in the emergency department.  Patient's liver enzymes also appear to be chronically elevated.  Advised to follow-up with gastroenterology.  Avoid alcohol and Tylenol.  Return precautions given.  Final Clinical Impressions(s) / ED Diagnoses   Final diagnoses:  URI with cough and congestion  Midline low back pain with sciatica, sciatica laterality unspecified, unspecified chronicity  Hyperglycemia  Elevated liver enzymes    ED Discharge Orders        Ordered    metFORMIN (GLUCOPHAGE) 500 MG tablet  2 times daily with meals     01/01/18 1428    predniSONE (DELTASONE) 20 MG tablet     01/01/18 1428    methocarbamol (ROBAXIN) 500 MG tablet  Every 8 hours PRN     01/01/18 1428    ibuprofen (ADVIL,MOTRIN) 600 MG tablet  Every 6 hours PRN     01/01/18 1428       Julianne Rice, MD 01/01/18 1429

## 2018-01-01 NOTE — ED Notes (Signed)
Rt called for inhaler

## 2018-01-06 ENCOUNTER — Other Ambulatory Visit (HOSPITAL_COMMUNITY): Payer: Self-pay | Admitting: Orthopedic Surgery

## 2018-01-06 DIAGNOSIS — M79671 Pain in right foot: Secondary | ICD-10-CM

## 2018-01-13 ENCOUNTER — Ambulatory Visit (HOSPITAL_COMMUNITY)
Admission: RE | Admit: 2018-01-13 | Discharge: 2018-01-13 | Disposition: A | Discharge status: Home / Self Care | Payer: Medicaid Other | Source: Ambulatory Visit | Attending: Orthopedic Surgery | Admitting: Orthopedic Surgery

## 2018-01-13 DIAGNOSIS — M79671 Pain in right foot: Secondary | ICD-10-CM | POA: Diagnosis present

## 2018-01-13 DIAGNOSIS — M7731 Calcaneal spur, right foot: Secondary | ICD-10-CM | POA: Diagnosis not present

## 2018-01-13 DIAGNOSIS — M19071 Primary osteoarthritis, right ankle and foot: Secondary | ICD-10-CM | POA: Insufficient documentation

## 2018-04-13 ENCOUNTER — Other Ambulatory Visit (HOSPITAL_COMMUNITY): Payer: Self-pay | Admitting: Anesthesiology

## 2018-04-13 DIAGNOSIS — M545 Low back pain: Secondary | ICD-10-CM

## 2018-04-23 ENCOUNTER — Ambulatory Visit (HOSPITAL_COMMUNITY)
Admission: RE | Admit: 2018-04-23 | Discharge: 2018-04-23 | Disposition: A | Discharge status: Home / Self Care | Payer: Self-pay | Source: Ambulatory Visit | Attending: Anesthesiology | Admitting: Anesthesiology

## 2018-04-23 DIAGNOSIS — M545 Low back pain: Secondary | ICD-10-CM | POA: Insufficient documentation

## 2018-06-02 ENCOUNTER — Other Ambulatory Visit: Payer: Self-pay

## 2018-06-02 ENCOUNTER — Emergency Department (HOSPITAL_COMMUNITY)
Admission: EM | Admit: 2018-06-02 | Discharge: 2018-06-02 | Disposition: A | Discharge status: Home / Self Care | Payer: Medicaid Other | Attending: Emergency Medicine | Admitting: Emergency Medicine

## 2018-06-02 ENCOUNTER — Encounter (HOSPITAL_COMMUNITY): Payer: Self-pay

## 2018-06-02 DIAGNOSIS — I10 Essential (primary) hypertension: Secondary | ICD-10-CM | POA: Insufficient documentation

## 2018-06-02 DIAGNOSIS — R739 Hyperglycemia, unspecified: Secondary | ICD-10-CM

## 2018-06-02 DIAGNOSIS — Z7984 Long term (current) use of oral hypoglycemic drugs: Secondary | ICD-10-CM | POA: Insufficient documentation

## 2018-06-02 DIAGNOSIS — G629 Polyneuropathy, unspecified: Secondary | ICD-10-CM

## 2018-06-02 DIAGNOSIS — F17210 Nicotine dependence, cigarettes, uncomplicated: Secondary | ICD-10-CM | POA: Insufficient documentation

## 2018-06-02 DIAGNOSIS — Z8673 Personal history of transient ischemic attack (TIA), and cerebral infarction without residual deficits: Secondary | ICD-10-CM | POA: Insufficient documentation

## 2018-06-02 DIAGNOSIS — E1142 Type 2 diabetes mellitus with diabetic polyneuropathy: Secondary | ICD-10-CM | POA: Insufficient documentation

## 2018-06-02 DIAGNOSIS — E1165 Type 2 diabetes mellitus with hyperglycemia: Secondary | ICD-10-CM | POA: Insufficient documentation

## 2018-06-02 DIAGNOSIS — M79671 Pain in right foot: Secondary | ICD-10-CM | POA: Insufficient documentation

## 2018-06-02 DIAGNOSIS — M79672 Pain in left foot: Secondary | ICD-10-CM | POA: Insufficient documentation

## 2018-06-02 LAB — BASIC METABOLIC PANEL
ANION GAP: 12 (ref 5–15)
BUN: 9 mg/dL (ref 6–20)
CHLORIDE: 96 mmol/L — AB (ref 98–111)
CO2: 25 mmol/L (ref 22–32)
Calcium: 9.6 mg/dL (ref 8.9–10.3)
Creatinine, Ser: 0.81 mg/dL (ref 0.61–1.24)
GFR calc Af Amer: >60 mL/min (ref >60)
Glucose, Bld: 377 mg/dL — ABNORMAL HIGH (ref 70–99)
POTASSIUM: 4.3 mmol/L (ref 3.5–5.1)
SODIUM: 133 mmol/L — AB (ref 135–145)

## 2018-06-02 LAB — CBG MONITORING, ED
GLUCOSE-CAPILLARY: 408 mg/dL — AB (ref 70–99)
GLUCOSE-CAPILLARY: 251 mg/dL — AB (ref 70–99)

## 2018-06-02 MED ORDER — SODIUM CHLORIDE 0.9 % IV BOLUS
1000.0000 mL | Freq: Once | INTRAVENOUS | Status: AC
  Administered 2018-06-02: 1000 mL via INTRAVENOUS

## 2018-06-02 MED ORDER — SODIUM CHLORIDE 0.9 % IV BOLUS
500.0000 mL | Freq: Once | INTRAVENOUS | Status: AC
  Administered 2018-06-02: 500 mL via INTRAVENOUS

## 2018-06-02 MED ORDER — IBUPROFEN 400 MG PO TABS
400.0000 mg | ORAL_TABLET | Freq: Once | ORAL | Status: AC
  Administered 2018-06-02: 400 mg via ORAL
  Filled 2018-06-02: qty 1

## 2018-06-02 MED ORDER — INSULIN ASPART 100 UNIT/ML ~~LOC~~ SOLN
8.0000 [IU] | Freq: Once | SUBCUTANEOUS | Status: AC
  Administered 2018-06-02: 8 [IU] via SUBCUTANEOUS
  Filled 2018-06-02: qty 1

## 2018-06-02 MED ORDER — ACETAMINOPHEN 500 MG PO TABS
1000.0000 mg | ORAL_TABLET | Freq: Once | ORAL | Status: AC
  Administered 2018-06-02: 1000 mg via ORAL
  Filled 2018-06-02: qty 2

## 2018-06-02 MED ORDER — GLIPIZIDE 5 MG PO TABS: 5.0000 mg | ORAL_TABLET | Freq: Every day | ORAL | 0 refills | Status: DC

## 2018-06-02 MED ORDER — GABAPENTIN 300 MG PO CAPS: 300.0000 mg | ORAL_CAPSULE | Freq: Every day | ORAL | 0 refills | Status: DC

## 2018-06-02 NOTE — ED Notes (Signed)
Patient left unit.  All questions and concerns addressed regarding discharge.

## 2018-06-02 NOTE — Discharge Instructions (Addendum)
It was our pleasure to provide your ER care today - we hope that you feel better.  Your blood sugar is high - drink plenty of water/fluids, take your metformin. Also take glipizide as prescribed.  Monitor blood sugars 4x/day and record values. Follow up with primary care doctor in the coming week.   For foot pain/neuropathic type pain - take neurontin as prescribed - at follow up with primary care doctor, discuss possibly titrating dose if it helps your symptoms. May make drowsy, no driving when taking.   Return to ER if worse, new symptoms, fevers, persistent vomiting, other concern.

## 2018-06-02 NOTE — ED Triage Notes (Signed)
Pt reports that he has bilateral foot pain for months. Pt went to see neuro specialist Dr Maryjean Ka and when he went back for results. Pt reports medicaid had been cancelled and couldn't get results. Pt states he has not been out of  gabapentin for several weeks and pain has increased

## 2018-06-02 NOTE — ED Provider Notes (Signed)
[facility] Provider Note   CSN: [id number] Arrival date & time: 06/02/18  0932     History   Chief Complaint Chief Complaint  Patient presents with  . Foot Pain    HPI Danny [name] is a 52 y.o. male.  Patient c/o bilateral foot pain for months/years. Pain constant, dull/burning, moderate, persistent. No specific exacerbating or alleviating factors. No acute or abrupt change today. No fever or chills. No foot swelling. No skin changes, lesions, ulcers or redness. No recent injury. No numbness. Hx diabetes. No claudication.   The history is provided by the patient.  Foot Pain  Pertinent negatives include no chest pain, no abdominal pain and no shortness of breath.    Past Medical History:  Diagnosis Date  . Anxiety   . Back pain   . CVA (cerebral infarction)   . Depression   . Diabetes mellitus   . Hypertension   . Migraines   . Stroke [facility])     Patient Active Problem List   Diagnosis Date Noted  . GREATER TROCHANTERIC BURSITIS 07/03/2007  . INCREASED BLOOD PRESSURE 07/03/2007  . BACK PAIN, LUMBAR 05/29/2007  . NUMBNESS 05/29/2007  . INSOMNIA, CHRONIC 05/13/2007  . HYPERLIPIDEMIA 01/13/2007  . ANXIETY DEPRESSION 01/13/2007  . SYNDROME, CHRONIC PAIN 01/13/2007  . CVA 01/13/2007  . ARTHRITIS 01/13/2007    Past Surgical History:  Procedure Laterality Date  . FOOT SURGERY    . MANDIBLE SURGERY          Home Medications    Prior to Admission medications   Medication Sig Start Date End Date Taking? Authorizing Provider  HYDROcodone-acetaminophen (NORCO/VICODIN) 5-325 MG tablet Take 1 tablet by mouth every 4 (four) hours as needed. Patient not taking: Reported on 01/01/2018 11/05/17   Evalee Jefferson, PA-C  hydrOXYzine (ATARAX/VISTARIL) 25 MG tablet Take 1 tablet (25 mg total) by mouth every 6 (six) hours as needed for anxiety. 04/01/17   Daleen Bo, MD  ibuprofen (ADVIL,MOTRIN) 600 MG tablet Take 1 tablet (600 mg total) by mouth every  6 (six) hours as needed. 01/01/18   Julianne Rice, MD  MELATONIN PO Take 5 mg by mouth at bedtime.    [provider]  metFORMIN (GLUCOPHAGE) 500 MG tablet Take 1 tablet (500 mg total) by mouth 2 (two) times daily with a meal. 01/01/18   Julianne Rice, MD  methocarbamol (ROBAXIN) 500 MG tablet Take 2 tablets (1,000 mg total) by mouth every 8 (eight) hours as needed for muscle spasms. 01/01/18   Julianne Rice, MD  predniSONE (DELTASONE) 20 MG tablet 3 tabs po day one, then 2 po daily x 4 days 01/01/18   Julianne Rice, MD    Family History Family History  Problem Relation Age of Onset  . Stroke Father   . Stroke Sister     Social History Social History   Tobacco Use  . Smoking status: Current Every Day Smoker    Packs/day: 1.00    Years: 20.00    Pack years: 20.00    Types: Cigarettes  . Smokeless tobacco: Never Used  Substance Use Topics  . Alcohol use: Not Currently    Comment: occ  . Drug use: Yes    Types: Marijuana     Allergies   Ultram [tramadol] and Benadryl [diphenhydramine]   Review of Systems Review of Systems  Constitutional: Negative for fever.  HENT: Negative for sore throat.   Eyes: Negative for redness.  Respiratory: Negative for shortness of breath.  Cardiovascular: Negative for chest pain.  Gastrointestinal: Negative for abdominal pain.  Genitourinary: Negative for flank pain.  Musculoskeletal: Negative for back pain.  Skin: Negative for rash.  Neurological: Negative for weakness and numbness.  Hematological: Does not bruise/bleed easily.  Psychiatric/Behavioral: Negative for confusion.     Physical Exam Updated Vital Signs BP (!) 161/95 (BP Location: Left Arm)   Pulse (!) 101   Temp 97.9 F (36.6 C) (Oral)   Resp 18   Ht 1.753 m (5\' 9" )   Wt 73.5 kg   SpO2 99%   BMI 23.92 kg/m   Physical Exam  Constitutional: He appears well-developed and well-nourished.  HENT:  Head: Atraumatic.  Eyes: Conjunctivae are normal.    Neck: Neck supple. No tracheal deviation present.  Cardiovascular: Normal rate and intact distal pulses.  Pulmonary/Chest: Effort normal. No accessory muscle usage. No respiratory distress.  Abdominal: He exhibits no distension.  Musculoskeletal: He exhibits no edema.  Bilateral feet appear normal, normal color and warmth. Dp/pt 2+ bil. Normal cap refill distally in toes. No focal bony tenderness.   Neurological: He is alert.  Speech normal. Steady gait.   Skin: Skin is warm and dry. No rash noted.  Psychiatric: He has a normal mood and affect.  Nursing note and vitals reviewed.    ED Treatments / Results  Labs (all labs ordered are listed, but only abnormal results are displayed) Results for orders placed or performed during the hospital encounter of 02/72/53  Basic metabolic panel  Result Value Ref Range   Sodium 133 (L) 135 - 145 mmol/L   Potassium 4.3 3.5 - 5.1 mmol/L   Chloride 96 (L) 98 - 111 mmol/L   CO2 25 22 - 32 mmol/L   Glucose, Bld 377 (H) 70 - 99 mg/dL   BUN 9 6 - 20 mg/dL   Creatinine, Ser 0.81 0.61 - 1.24 mg/dL   Calcium 9.6 8.9 - 10.3 mg/dL   GFR calc non Af Amer >60 >60 mL/min   GFR calc Af Amer >60 >60 mL/min   Anion gap 12 5 - 15  CBG monitoring, ED  Result Value Ref Range   Glucose-Capillary 408 (H) 70 - 99 mg/dL    EKG None  Radiology No results found.  Procedures Procedures (including critical care time)  Medications Ordered in ED Medications  sodium chloride 0.9 % bolus 1,000 mL (has no administration in time range)     Initial Impression / Assessment and Plan / ED Course  I have reviewed the triage vital signs and the nursing notes.  Pertinent labs & imaging results that were available during my care of the patient were reviewed by me and considered in my medical decision making (see chart for details).  cbg high, 400's, hx dm. Iv ns bolus. Labs.   Suspect chronic bilateral foot pain may be due to neuropathy. rx provided.  Reviewed  nursing notes and prior charts for additional history.   Recheck glucose improved. No nv. Po fluids.  novolog sq.   Labs reviewed - glucose elev, hco3 normal.   Additional ivf.   Glucose improved.   Pt appears stable for d/c.     Final Clinical Impressions(s) / ED Diagnoses   Final diagnoses:  None    ED Discharge Orders    None       Lajean Saver, MD 06/02/18 1123

## 2019-02-17 ENCOUNTER — Encounter (HOSPITAL_COMMUNITY): Payer: Self-pay | Admitting: Emergency Medicine

## 2019-02-17 ENCOUNTER — Emergency Department (HOSPITAL_COMMUNITY): Payer: Medicaid Other

## 2019-02-17 ENCOUNTER — Emergency Department (HOSPITAL_COMMUNITY)
Admission: EM | Admit: 2019-02-17 | Discharge: 2019-02-17 | Disposition: A | Discharge status: Home / Self Care | Payer: Medicaid Other | Attending: Emergency Medicine | Admitting: Emergency Medicine

## 2019-02-17 ENCOUNTER — Other Ambulatory Visit: Payer: Self-pay

## 2019-02-17 DIAGNOSIS — F17210 Nicotine dependence, cigarettes, uncomplicated: Secondary | ICD-10-CM | POA: Insufficient documentation

## 2019-02-17 DIAGNOSIS — R1011 Right upper quadrant pain: Secondary | ICD-10-CM | POA: Insufficient documentation

## 2019-02-17 DIAGNOSIS — E119 Type 2 diabetes mellitus without complications: Secondary | ICD-10-CM | POA: Diagnosis not present

## 2019-02-17 DIAGNOSIS — Z7984 Long term (current) use of oral hypoglycemic drugs: Secondary | ICD-10-CM | POA: Diagnosis not present

## 2019-02-17 DIAGNOSIS — I10 Essential (primary) hypertension: Secondary | ICD-10-CM | POA: Diagnosis not present

## 2019-02-17 DIAGNOSIS — Z8673 Personal history of transient ischemic attack (TIA), and cerebral infarction without residual deficits: Secondary | ICD-10-CM | POA: Insufficient documentation

## 2019-02-17 DIAGNOSIS — Z79899 Other long term (current) drug therapy: Secondary | ICD-10-CM | POA: Diagnosis not present

## 2019-02-17 LAB — CBC WITH DIFFERENTIAL/PLATELET
Abs Immature Granulocytes: 0.02 10*3/uL (ref 0.00–0.07)
Basophils Absolute: 0.1 10*3/uL (ref 0.0–0.1)
Basophils Relative: 1 %
Eosinophils Absolute: 0.4 10*3/uL (ref 0.0–0.5)
Eosinophils Relative: 5 %
HCT: 52.2 % — ABNORMAL HIGH (ref 39.0–52.0)
Hemoglobin: 17.6 g/dL — ABNORMAL HIGH (ref 13.0–17.0)
Immature Granulocytes: 0 %
Lymphocytes Relative: 39 %
Lymphs Abs: 3 10*3/uL (ref 0.7–4.0)
MCH: 30.7 pg (ref 26.0–34.0)
MCHC: 33.7 g/dL (ref 30.0–36.0)
MCV: 90.9 fL (ref 80.0–100.0)
Monocytes Absolute: 0.7 10*3/uL (ref 0.1–1.0)
Monocytes Relative: 10 %
Neutro Abs: 3.4 10*3/uL (ref 1.7–7.7)
Neutrophils Relative %: 45 %
Platelets: 209 10*3/uL (ref 150–400)
RBC: 5.74 MIL/uL (ref 4.22–5.81)
RDW: 12.3 % (ref 11.5–15.5)
WBC: 7.6 10*3/uL (ref 4.0–10.5)
nRBC: 0 % (ref 0.0–0.2)

## 2019-02-17 LAB — COMPREHENSIVE METABOLIC PANEL
ALT: 100 U/L — ABNORMAL HIGH (ref 0–44)
AST: 75 U/L — ABNORMAL HIGH (ref 15–41)
Albumin: 4.1 g/dL (ref 3.5–5.0)
Alkaline Phosphatase: 126 U/L (ref 38–126)
Anion gap: 15 (ref 5–15)
BUN: 11 mg/dL (ref 6–20)
CO2: 19 mmol/L — ABNORMAL LOW (ref 22–32)
Calcium: 9.7 mg/dL (ref 8.9–10.3)
Chloride: 104 mmol/L (ref 98–111)
Creatinine, Ser: 0.8 mg/dL (ref 0.61–1.24)
GFR calc Af Amer: >60 mL/min (ref >60)
GFR calc non Af Amer: >60 mL/min (ref >60)
Glucose, Bld: 200 mg/dL — ABNORMAL HIGH (ref 70–99)
Potassium: 3.9 mmol/L (ref 3.5–5.1)
Sodium: 138 mmol/L (ref 135–145)
Total Bilirubin: 1 mg/dL (ref 0.3–1.2)
Total Protein: 7.9 g/dL (ref 6.5–8.1)

## 2019-02-17 LAB — I-STAT CHEM 8, ED
BUN: 10 mg/dL (ref 6–20)
Calcium, Ion: 1.16 mmol/L (ref 1.15–1.40)
Chloride: 107 mmol/L (ref 98–111)
Creatinine, Ser: 0.8 mg/dL (ref 0.61–1.24)
Glucose, Bld: 202 mg/dL — ABNORMAL HIGH (ref 70–99)
HCT: 53 % — ABNORMAL HIGH (ref 39.0–52.0)
Hemoglobin: 18 g/dL — ABNORMAL HIGH (ref 13.0–17.0)
Potassium: 3.9 mmol/L (ref 3.5–5.1)
Sodium: 138 mmol/L (ref 135–145)
TCO2: 20 mmol/L — ABNORMAL LOW (ref 22–32)

## 2019-02-17 LAB — LIPASE, BLOOD: Lipase: 59 U/L — ABNORMAL HIGH (ref 11–51)

## 2019-02-17 LAB — LACTIC ACID, PLASMA
Lactic Acid, Venous: 2.2 mmol/L (ref 0.5–1.9)
Lactic Acid, Venous: 0.9 mmol/L (ref 0.5–1.9)

## 2019-02-17 LAB — POC OCCULT BLOOD, ED: Fecal Occult Bld: NEGATIVE

## 2019-02-17 LAB — CBG MONITORING, ED: Glucose-Capillary: 201 mg/dL — ABNORMAL HIGH (ref 70–99)

## 2019-02-17 MED ORDER — SUCRALFATE 1 G PO TABS: 1.0000 g | ORAL_TABLET | Freq: Four times a day (QID) | ORAL | 0 refills | Status: DC | PRN

## 2019-02-17 MED ORDER — IOHEXOL 300 MG/ML  SOLN
100.0000 mL | Freq: Once | INTRAMUSCULAR | Status: AC | PRN
  Administered 2019-02-17: 100 mL via INTRAVENOUS

## 2019-02-17 MED ORDER — FENTANYL CITRATE (PF) 100 MCG/2ML IJ SOLN
100.0000 ug | Freq: Once | INTRAMUSCULAR | Status: AC
  Administered 2019-02-17: 100 ug via INTRAVENOUS
  Filled 2019-02-17: qty 2

## 2019-02-17 MED ORDER — SODIUM CHLORIDE 0.9 % IV BOLUS
1000.0000 mL | Freq: Once | INTRAVENOUS | Status: AC
  Administered 2019-02-17: 08:00:00 1000 mL via INTRAVENOUS

## 2019-02-17 MED ORDER — HYDROMORPHONE HCL 1 MG/ML IJ SOLN
1.0000 mg | Freq: Once | INTRAMUSCULAR | Status: AC
  Administered 2019-02-17: 1 mg via INTRAVENOUS
  Filled 2019-02-17: qty 1

## 2019-02-17 MED ORDER — OMEPRAZOLE 20 MG PO CPDR: 20.0000 mg | DELAYED_RELEASE_CAPSULE | Freq: Every day | ORAL | 1 refills | Status: DC

## 2019-02-17 MED ORDER — ONDANSETRON HCL 4 MG/2ML IJ SOLN
4.0000 mg | Freq: Once | INTRAMUSCULAR | Status: AC
  Administered 2019-02-17: 4 mg via INTRAVENOUS
  Filled 2019-02-17: qty 2

## 2019-02-17 MED ORDER — SODIUM CHLORIDE 0.9 % IV BOLUS (SEPSIS)
1000.0000 mL | Freq: Once | INTRAVENOUS | Status: AC
  Administered 2019-02-17: 06:00:00 1000 mL via INTRAVENOUS

## 2019-02-17 MED ORDER — LIDOCAINE VISCOUS HCL 2 % MT SOLN
15.0000 mL | Freq: Once | OROMUCOSAL | Status: AC
  Administered 2019-02-17: 15 mL via ORAL
  Filled 2019-02-17: qty 15

## 2019-02-17 MED ORDER — HYDROMORPHONE HCL 1 MG/ML IJ SOLN
0.5000 mg | Freq: Once | INTRAMUSCULAR | Status: AC
  Administered 2019-02-17: 0.5 mg via INTRAVENOUS
  Filled 2019-02-17: qty 1

## 2019-02-17 MED ORDER — ALUM & MAG HYDROXIDE-SIMETH 200-200-20 MG/5ML PO SUSP
30.0000 mL | Freq: Once | ORAL | Status: AC
  Administered 2019-02-17: 30 mL via ORAL
  Filled 2019-02-17: qty 30

## 2019-02-17 NOTE — Discharge Instructions (Signed)
You were evaluated in the Emergency Department and after careful evaluation, we did not find any emergent condition requiring admission or further testing in the hospital.  Your symptoms today seem to be due to gastritis, or inflammation of the lining of the stomach.  Please use the medications provided as directed.  Please return to the Emergency Department if you experience any worsening of your condition.  We encourage you to follow up with a primary care provider.  Thank you for allowing [place] to be a part of your care.

## 2019-02-17 NOTE — ED Provider Notes (Signed)
[facility] EMERGENCY DEPARTMENT Provider Note   CSN: [id number] Arrival date & time: 02/17/19  0516    History   Chief Complaint Chief Complaint  Patient presents with  . Abdominal Pain    HPI Danny [name] is a 53 y.o. male.     The history is provided by the patient.  Abdominal Pain  Pain location:  Generalized Pain quality: burning   Pain radiates to:  Does not radiate Pain severity:  Severe Onset quality:  Gradual Duration:  1 week Timing:  Intermittent Progression:  Worsening Chronicity:  New Relieved by:  Nothing Worsened by:  Movement and palpation Associated symptoms: cough and vomiting   Associated symptoms: no chest pain, no diarrhea and no fever    Patient with history of stroke, migraines, diabetes presents with abdominal pain. Patient reports "my guts are burning, doc!"  for the past week.  He reports it worsened tonight.  He has had vomiting, unsure if it has been bloody.  No chest pain.  No fever.  No difficulty urinating. He does not recall having this pain before. No previous abdominal surgery Past Medical History:  Diagnosis Date  . Anxiety   . Back pain   . CVA (cerebral infarction)   . Depression   . Diabetes mellitus   . Hypertension   . Migraines   . Stroke [facility])     Patient Active Problem List   Diagnosis Date Noted  . GREATER TROCHANTERIC BURSITIS 07/03/2007  . INCREASED BLOOD PRESSURE 07/03/2007  . BACK PAIN, LUMBAR 05/29/2007  . NUMBNESS 05/29/2007  . INSOMNIA, CHRONIC 05/13/2007  . HYPERLIPIDEMIA 01/13/2007  . ANXIETY DEPRESSION 01/13/2007  . SYNDROME, CHRONIC PAIN 01/13/2007  . CVA 01/13/2007  . ARTHRITIS 01/13/2007    Past Surgical History:  Procedure Laterality Date  . FOOT SURGERY    . MANDIBLE SURGERY          Home Medications    Prior to Admission medications   Medication Sig Start Date End Date Taking? Authorizing Provider  gabapentin (NEURONTIN) 300 MG capsule Take 1 capsule (300 mg total) by mouth  at bedtime. 06/02/18   Lajean Saver, MD  glipiZIDE (GLUCOTROL) 5 MG tablet Take 1 tablet (5 mg total) by mouth daily before breakfast. 06/02/18   Lajean Saver, MD  ibuprofen (ADVIL,MOTRIN) 600 MG tablet Take 1 tablet (600 mg total) by mouth every 6 (six) hours as needed. 01/01/18   Julianne Rice, MD  MELATONIN PO Take 5 mg by mouth at bedtime.    [provider]  metFORMIN (GLUCOPHAGE) 500 MG tablet Take 1 tablet (500 mg total) by mouth 2 (two) times daily with a meal. 01/01/18   Julianne Rice, MD    Family History Family History  Problem Relation Age of Onset  . Stroke Father   . Stroke Sister     Social History Social History   Tobacco Use  . Smoking status: Current Every Day Smoker    Packs/day: 1.00    Years: 20.00    Pack years: 20.00    Types: Cigarettes  . Smokeless tobacco: Never Used  Substance Use Topics  . Alcohol use: Not Currently    Comment: occ  . Drug use: Yes    Types: Marijuana     Allergies   Ultram [tramadol] and Benadryl [diphenhydramine]   Review of Systems Review of Systems  Constitutional: Negative for fever.  Respiratory: Positive for cough.   Cardiovascular: Negative for chest pain.  Gastrointestinal: Positive for abdominal pain and vomiting.  Negative for diarrhea.  Genitourinary: Negative for testicular pain.  All other systems reviewed and are negative.    Physical Exam Updated Vital Signs BP (!) 146/80   Pulse (!) 105   Temp 97.8 F (36.6 C) (Oral)   Resp 16   Ht 1.753 m (5\' 9" )   Wt 74.8 kg   SpO2 98%   BMI 24.37 kg/m   Physical Exam CONSTITUTIONAL: uncomfortable, writhing in the bed, clutching his abdomen HEAD: Normocephalic/atraumatic EYES: EOMI/PERRL, no icterus ENMT: Mucous membranes dry, poor dentition NECK: supple no meningeal signs SPINE/BACK:entire spine nontender CV: S1/S2 noted, no murmurs/rubs/gallops noted LUNGS: Lungs are clear to auscultation bilaterally, no apparent distress ABDOMEN: soft,  diffuse tenderness, voluntary guarding noted,  bowel sounds noted throughout abdomen GU:no cva tenderness, no scrotal/perineal tenderness, chaperone present for exam Rectal - no blood/melena/masses/abscess noted - nurse chaperone present NEURO: Pt is awake/alert/appropriate, moves all extremitiesx4.  No facial droop.   EXTREMITIES: pulses normal/equal, full ROM SKIN: warm, color normal PSYCH: anxious  ED Treatments / Results  Labs (all labs ordered are listed, but only abnormal results are displayed) Labs Reviewed  CBC WITH DIFFERENTIAL/PLATELET - Abnormal; Notable for the following components:      Result Value   Hemoglobin 17.6 (*)    HCT 52.2 (*)    All other components within normal limits  COMPREHENSIVE METABOLIC PANEL - Abnormal; Notable for the following components:   CO2 19 (*)    Glucose, Bld 200 (*)    AST 75 (*)    ALT 100 (*)    All other components within normal limits  LIPASE, BLOOD - Abnormal; Notable for the following components:   Lipase 59 (*)    All other components within normal limits  LACTIC ACID, PLASMA - Abnormal; Notable for the following components:   Lactic Acid, Venous 2.2 (*)    All other components within normal limits  I-STAT CHEM 8, ED - Abnormal; Notable for the following components:   Glucose, Bld 202 (*)    TCO2 20 (*)    Hemoglobin 18.0 (*)    HCT 53.0 (*)    All other components within normal limits  CBG MONITORING, ED - Abnormal; Notable for the following components:   Glucose-Capillary 201 (*)    All other components within normal limits  LACTIC ACID, PLASMA  POC OCCULT BLOOD, ED    EKG EKG Interpretation  Date/Time:  Wednesday February 17 2019 05:34:44 EDT Ventricular Rate:  104 PR Interval:    QRS Duration: 82 QT Interval:  337 QTC Calculation: 444 R Axis:   78 Text Interpretation:  Sinus tachycardia Biatrial enlargement Probable anteroseptal infarct, old No significant change since last tracing Confirmed by Ripley Fraise  [id number]) on 02/17/2019 5:40:05 AM   Radiology No results found.  Procedures Procedures  Medications Ordered in ED Medications  fentaNYL (SUBLIMAZE) injection 100 mcg (100 mcg Intravenous Given 02/17/19 0539)  ondansetron (ZOFRAN) injection 4 mg (4 mg Intravenous Given 02/17/19 0539)  sodium chloride 0.9 % bolus 1,000 mL (1,000 mLs Intravenous New Bag/Given 02/17/19 0554)  HYDROmorphone (DILAUDID) injection 1 mg (1 mg Intravenous Given 02/17/19 0554)  iohexol (OMNIPAQUE) 300 MG/ML solution 100 mL (100 mLs Intravenous Contrast Given 02/17/19 0615)     Initial Impression / Assessment and Plan / ED Course  I have reviewed the triage vital signs and the nursing notes.  Pertinent labs & imaging results that were available during my care of the patient were reviewed by me and considered in my medical  decision making (see chart for details).        5:38 AM Reports abdominal pain for the past week is worsening.  Spoke to wife via phone, she reports he has had the pain goes refused to be evaluated.  Tonight his pain accelerated and he begged to go to the ER. Patient is ill-appearing, he will require CT imaging. 6:51 AM CT imaging pending.  Signed out to Dr. Sedonia Small with imaging pending. Final Clinical Impressions(s) / ED Diagnoses   Final diagnoses:  None    ED Discharge Orders    None       Ripley Fraise, MD 02/17/19 [phone]

## 2019-02-17 NOTE — ED Notes (Signed)
edp to room to assess

## 2019-02-17 NOTE — ED Notes (Signed)
Pt returned from ct

## 2019-02-17 NOTE — ED Notes (Signed)
Pt given urinal.

## 2019-02-17 NOTE — ED Provider Notes (Signed)
  Provider Note MRN:  [id number]  Arrival date & time: 02/17/19    ED Course and Medical Decision Making  Assumed care from Dr. Christy Gentles at shift change.  Continued abdominal pain despite nonacute CT scan.  Patient does have right upper quadrant tenderness palpation, mildly elevated lipase and LFTs, CT scan does mention dilated gallbladder.  Will provide further pain medication and obtain right upper quadrant ultrasound to exclude cholecystitis.  8:38 AM update: Right upper quadrant ultrasound is largely unremarkable.  Upon reevaluation patient is feeling much better after GI cocktail, explaining that very soon after drinking it he felt significant relief.  With our negative work-up today I suspect gastritis or peptic ulcer disease, will provide PPI and Carafate prescriptions, advised PCP follow-up.  After the discussed management above, the patient was determined to be safe for discharge.  The patient was in agreement with this plan and all questions regarding their care were answered.  ED return precautions were discussed and the patient will return to the ED with any significant worsening of condition.  Final Clinical Impressions(s) / ED Diagnoses     ICD-10-CM   1. RUQ abdominal pain R10.11 US Abdomen Limited RUQ    US Abdomen Limited RUQ    ED Discharge Orders         Ordered    omeprazole (PRILOSEC) 20 MG capsule  Daily     02/17/19 0838    sucralfate (CARAFATE) 1 g tablet  4 times daily PRN     02/17/19 0838           Barth Kirks. Sedonia Small, [place] [email or web address]    Maudie Flakes, MD 02/17/19 [phone]

## 2019-02-17 NOTE — ED Notes (Signed)
Patient to be transported to CT

## 2019-02-17 NOTE — ED Notes (Signed)
Date and time results received: 02/17/19 0559  Test: lactic acid Critical Value: 2.2  Name of Provider Notified: [place]  Orders Received? Or Actions Taken?: n/a

## 2019-02-17 NOTE — ED Triage Notes (Signed)
Pt c/o abd pain and vomiting x 1 week, denies diarrhea and fever

## 2019-04-26 ENCOUNTER — Encounter (HOSPITAL_COMMUNITY): Payer: Self-pay

## 2019-04-26 ENCOUNTER — Emergency Department (HOSPITAL_COMMUNITY): Payer: Medicaid Other

## 2019-04-26 ENCOUNTER — Emergency Department (HOSPITAL_COMMUNITY)
Admission: EM | Admit: 2019-04-26 | Discharge: 2019-04-26 | Discharge status: Against Medical Advice | Payer: Medicaid Other | Attending: Emergency Medicine | Admitting: Emergency Medicine

## 2019-04-26 ENCOUNTER — Other Ambulatory Visit: Payer: Self-pay

## 2019-04-26 DIAGNOSIS — S80212A Abrasion, left knee, initial encounter: Secondary | ICD-10-CM | POA: Insufficient documentation

## 2019-04-26 DIAGNOSIS — R51 Headache: Secondary | ICD-10-CM | POA: Insufficient documentation

## 2019-04-26 DIAGNOSIS — Z79899 Other long term (current) drug therapy: Secondary | ICD-10-CM | POA: Diagnosis not present

## 2019-04-26 DIAGNOSIS — F17210 Nicotine dependence, cigarettes, uncomplicated: Secondary | ICD-10-CM | POA: Diagnosis not present

## 2019-04-26 DIAGNOSIS — I10 Essential (primary) hypertension: Secondary | ICD-10-CM | POA: Diagnosis not present

## 2019-04-26 DIAGNOSIS — E119 Type 2 diabetes mellitus without complications: Secondary | ICD-10-CM | POA: Diagnosis not present

## 2019-04-26 DIAGNOSIS — Y929 Unspecified place or not applicable: Secondary | ICD-10-CM | POA: Diagnosis not present

## 2019-04-26 DIAGNOSIS — Y93I9 Activity, other involving external motion: Secondary | ICD-10-CM | POA: Insufficient documentation

## 2019-04-26 DIAGNOSIS — F10929 Alcohol use, unspecified with intoxication, unspecified: Secondary | ICD-10-CM | POA: Insufficient documentation

## 2019-04-26 DIAGNOSIS — Y907 Blood alcohol level of 200-239 mg/100 ml: Secondary | ICD-10-CM | POA: Diagnosis not present

## 2019-04-26 DIAGNOSIS — S81811A Laceration without foreign body, right lower leg, initial encounter: Secondary | ICD-10-CM | POA: Diagnosis not present

## 2019-04-26 DIAGNOSIS — Z7984 Long term (current) use of oral hypoglycemic drugs: Secondary | ICD-10-CM | POA: Insufficient documentation

## 2019-04-26 DIAGNOSIS — S59912A Unspecified injury of left forearm, initial encounter: Secondary | ICD-10-CM | POA: Diagnosis present

## 2019-04-26 DIAGNOSIS — F10920 Alcohol use, unspecified with intoxication, uncomplicated: Secondary | ICD-10-CM

## 2019-04-26 DIAGNOSIS — Y999 Unspecified external cause status: Secondary | ICD-10-CM | POA: Insufficient documentation

## 2019-04-26 DIAGNOSIS — S50812A Abrasion of left forearm, initial encounter: Secondary | ICD-10-CM | POA: Insufficient documentation

## 2019-04-26 LAB — BASIC METABOLIC PANEL
Anion gap: 14 (ref 5–15)
BUN: 9 mg/dL (ref 6–20)
CO2: 17 mmol/L — ABNORMAL LOW (ref 22–32)
Calcium: 9 mg/dL (ref 8.9–10.3)
Chloride: 98 mmol/L (ref 98–111)
Creatinine, Ser: 0.68 mg/dL (ref 0.61–1.24)
GFR calc Af Amer: >60 mL/min (ref >60)
GFR calc non Af Amer: >60 mL/min (ref >60)
Glucose, Bld: 179 mg/dL — ABNORMAL HIGH (ref 70–99)
Potassium: 3.4 mmol/L — ABNORMAL LOW (ref 3.5–5.1)
Sodium: 129 mmol/L — ABNORMAL LOW (ref 135–145)

## 2019-04-26 LAB — CBC WITH DIFFERENTIAL/PLATELET
Abs Immature Granulocytes: 0.04 10*3/uL (ref 0.00–0.07)
Basophils Absolute: 0.1 10*3/uL (ref 0.0–0.1)
Basophils Relative: 1 %
Eosinophils Absolute: 0.1 10*3/uL (ref 0.0–0.5)
Eosinophils Relative: 1 %
HCT: 48.9 % (ref 39.0–52.0)
Hemoglobin: 17 g/dL (ref 13.0–17.0)
Immature Granulocytes: 0 %
Lymphocytes Relative: 27 %
Lymphs Abs: 3 10*3/uL (ref 0.7–4.0)
MCH: 30.4 pg (ref 26.0–34.0)
MCHC: 34.8 g/dL (ref 30.0–36.0)
MCV: 87.5 fL (ref 80.0–100.0)
Monocytes Absolute: 0.7 10*3/uL (ref 0.1–1.0)
Monocytes Relative: 6 %
Neutro Abs: 7.2 10*3/uL (ref 1.7–7.7)
Neutrophils Relative %: 65 %
Platelets: 198 10*3/uL (ref 150–400)
RBC: 5.59 MIL/uL (ref 4.22–5.81)
RDW: 12.3 % (ref 11.5–15.5)
WBC: 11.2 10*3/uL — ABNORMAL HIGH (ref 4.0–10.5)
nRBC: 0 % (ref 0.0–0.2)

## 2019-04-26 LAB — ETHANOL: Alcohol, Ethyl (B): 211 mg/dL — ABNORMAL HIGH (ref <10)

## 2019-04-26 MED ORDER — TETANUS-DIPHTH-ACELL PERTUSSIS 5-2.5-18.5 LF-MCG/0.5 IM SUSP: 0.5000 mL | Freq: Once | INTRAMUSCULAR | Status: DC

## 2019-04-26 MED ORDER — IBUPROFEN 400 MG PO TABS: 600.0000 mg | ORAL_TABLET | Freq: Once | ORAL | Status: DC

## 2019-04-26 NOTE — ED Notes (Signed)
Pt refusing wound care and medications. Pt called for his ride and states that he knows his arm is broken and he needs "really pain medication". PA notified.
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
## 2019-04-26 NOTE — ED Notes (Signed)
Patient transported to CT and X ray 
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
## 2019-05-11 ENCOUNTER — Other Ambulatory Visit: Payer: Self-pay

## 2019-05-11 DIAGNOSIS — Z20822 Contact with and (suspected) exposure to covid-19: Secondary | ICD-10-CM

## 2019-05-12 LAB — NOVEL CORONAVIRUS, NAA: SARS-CoV-2, NAA: NOT DETECTED

## 2019-05-18 ENCOUNTER — Emergency Department (HOSPITAL_COMMUNITY)
Admission: EM | Admit: 2019-05-18 | Discharge: 2019-05-18 | Discharge status: Against Medical Advice | Payer: Medicaid Other | Attending: Emergency Medicine | Admitting: Emergency Medicine

## 2019-05-18 ENCOUNTER — Encounter (HOSPITAL_COMMUNITY): Payer: Self-pay

## 2019-05-18 ENCOUNTER — Other Ambulatory Visit: Payer: Self-pay

## 2019-05-18 DIAGNOSIS — Z5321 Procedure and treatment not carried out due to patient leaving prior to being seen by health care provider: Secondary | ICD-10-CM | POA: Diagnosis not present

## 2019-05-18 DIAGNOSIS — Z590 Homelessness: Secondary | ICD-10-CM | POA: Diagnosis not present

## 2019-05-18 DIAGNOSIS — R51 Headache: Secondary | ICD-10-CM | POA: Diagnosis present

## 2019-05-18 NOTE — ED Triage Notes (Signed)
Pt reports is homeless and is trying to get into a homeless shelter.  C/O headache x 3 days.  Reports chronic cough.  Pt says was tested for covid last week and was negative.

## 2019-05-24 ENCOUNTER — Encounter (HOSPITAL_COMMUNITY): Payer: Self-pay

## 2019-05-24 ENCOUNTER — Emergency Department (HOSPITAL_COMMUNITY)
Admission: EM | Admit: 2019-05-24 | Discharge: 2019-05-25 | Disposition: A | Discharge status: Other Acute Inpatient Hospital | Payer: Medicaid Other | Attending: Emergency Medicine | Admitting: Emergency Medicine

## 2019-05-24 ENCOUNTER — Other Ambulatory Visit: Payer: Self-pay

## 2019-05-24 DIAGNOSIS — Z20828 Contact with and (suspected) exposure to other viral communicable diseases: Secondary | ICD-10-CM | POA: Diagnosis not present

## 2019-05-24 DIAGNOSIS — R45851 Suicidal ideations: Secondary | ICD-10-CM | POA: Insufficient documentation

## 2019-05-24 DIAGNOSIS — I10 Essential (primary) hypertension: Secondary | ICD-10-CM | POA: Insufficient documentation

## 2019-05-24 DIAGNOSIS — F29 Unspecified psychosis not due to a substance or known physiological condition: Secondary | ICD-10-CM | POA: Diagnosis not present

## 2019-05-24 DIAGNOSIS — E119 Type 2 diabetes mellitus without complications: Secondary | ICD-10-CM | POA: Insufficient documentation

## 2019-05-24 DIAGNOSIS — R45850 Homicidal ideations: Secondary | ICD-10-CM | POA: Insufficient documentation

## 2019-05-24 DIAGNOSIS — F17210 Nicotine dependence, cigarettes, uncomplicated: Secondary | ICD-10-CM | POA: Diagnosis not present

## 2019-05-24 DIAGNOSIS — Z7984 Long term (current) use of oral hypoglycemic drugs: Secondary | ICD-10-CM | POA: Diagnosis not present

## 2019-05-24 DIAGNOSIS — F1010 Alcohol abuse, uncomplicated: Secondary | ICD-10-CM | POA: Insufficient documentation

## 2019-05-24 DIAGNOSIS — Z79899 Other long term (current) drug therapy: Secondary | ICD-10-CM | POA: Insufficient documentation

## 2019-05-24 HISTORY — DX: Alcohol abuse, uncomplicated: F10.10

## 2019-05-24 LAB — CBC WITH DIFFERENTIAL/PLATELET
Abs Immature Granulocytes: 0.01 10*3/uL (ref 0.00–0.07)
Basophils Absolute: 0.1 10*3/uL (ref 0.0–0.1)
Basophils Relative: 2 %
Eosinophils Absolute: 0.2 10*3/uL (ref 0.0–0.5)
Eosinophils Relative: 3 %
HCT: 48.6 % (ref 39.0–52.0)
Hemoglobin: 16.4 g/dL (ref 13.0–17.0)
Immature Granulocytes: 0 %
Lymphocytes Relative: 34 %
Lymphs Abs: 2.8 10*3/uL (ref 0.7–4.0)
MCH: 30.8 pg (ref 26.0–34.0)
MCHC: 33.7 g/dL (ref 30.0–36.0)
MCV: 91.2 fL (ref 80.0–100.0)
Monocytes Absolute: 0.6 10*3/uL (ref 0.1–1.0)
Monocytes Relative: 7 %
Neutro Abs: 4.5 10*3/uL (ref 1.7–7.7)
Neutrophils Relative %: 54 %
Platelets: 176 10*3/uL (ref 150–400)
RBC: 5.33 MIL/uL (ref 4.22–5.81)
RDW: 12.2 % (ref 11.5–15.5)
WBC: 8.2 10*3/uL (ref 4.0–10.5)
nRBC: 0 % (ref 0.0–0.2)

## 2019-05-24 LAB — COMPREHENSIVE METABOLIC PANEL
ALT: 80 U/L — ABNORMAL HIGH (ref 0–44)
AST: 55 U/L — ABNORMAL HIGH (ref 15–41)
Albumin: 3.9 g/dL (ref 3.5–5.0)
Alkaline Phosphatase: 140 U/L — ABNORMAL HIGH (ref 38–126)
Anion gap: 11 (ref 5–15)
BUN: 9 mg/dL (ref 6–20)
CO2: 23 mmol/L (ref 22–32)
Calcium: 9.6 mg/dL (ref 8.9–10.3)
Chloride: 102 mmol/L (ref 98–111)
Creatinine, Ser: 0.97 mg/dL (ref 0.61–1.24)
GFR calc Af Amer: >60 mL/min (ref >60)
GFR calc non Af Amer: >60 mL/min (ref >60)
Glucose, Bld: 156 mg/dL — ABNORMAL HIGH (ref 70–99)
Potassium: 4.1 mmol/L (ref 3.5–5.1)
Sodium: 136 mmol/L (ref 135–145)
Total Bilirubin: 0.5 mg/dL (ref 0.3–1.2)
Total Protein: 7.4 g/dL (ref 6.5–8.1)

## 2019-05-24 LAB — ETHANOL: Alcohol, Ethyl (B): <10 mg/dL (ref <10)

## 2019-05-24 LAB — SALICYLATE LEVEL: Salicylate Lvl: <7 mg/dL (ref 2.8–30.0)

## 2019-05-24 LAB — ACETAMINOPHEN LEVEL: Acetaminophen (Tylenol), Serum: <10 ug/mL — ABNORMAL LOW (ref 10–30)

## 2019-05-24 MED ORDER — VITAMIN B-1 100 MG PO TABS
100.0000 mg | ORAL_TABLET | Freq: Every day | ORAL | Status: DC
  Administered 2019-05-24 – 2019-05-25 (×2): 100 mg via ORAL
  Filled 2019-05-24 (×2): qty 1

## 2019-05-24 MED ORDER — LORAZEPAM 1 MG PO TABS: 0.0000 mg | ORAL_TABLET | Freq: Two times a day (BID) | ORAL | Status: DC

## 2019-05-24 MED ORDER — LORAZEPAM 1 MG PO TABS
0.0000 mg | ORAL_TABLET | Freq: Four times a day (QID) | ORAL | Status: DC
  Administered 2019-05-24 – 2019-05-25 (×2): 2 mg via ORAL
  Filled 2019-05-24 (×2): qty 2

## 2019-05-24 MED ORDER — THIAMINE HCL 100 MG/ML IJ SOLN: 100.0000 mg | Freq: Every day | INTRAMUSCULAR | Status: DC

## 2019-05-24 MED ORDER — LORAZEPAM 2 MG/ML IJ SOLN: 0.0000 mg | Freq: Four times a day (QID) | INTRAMUSCULAR | Status: DC

## 2019-05-24 MED ORDER — LORAZEPAM 2 MG/ML IJ SOLN: 0.0000 mg | Freq: Two times a day (BID) | INTRAMUSCULAR | Status: DC

## 2019-05-24 NOTE — ED Notes (Signed)
Hassell Done, NT attempted several times to get an EKG and patient would not sit still

## 2019-05-24 NOTE — ED Provider Notes (Signed)
[facility] DEPT Provider Note   CSN: [id number] Arrival date & time: 05/24/19  1650     History   Chief Complaint Chief Complaint  Patient presents with  . Suicidal  . Homicidal  . detox    HPI Danny [name] is a 53 y.o. male with a past medical history diabetes, hypertension, prior CVA, alcohol abuse presents to ED for suicidal and homicidal ideation for the past month.  Patient states that he drinks about 112 pack of beer every day for the past several years.  He has not suffered from alcohol withdrawal seizures or DTs in the past.  He has tried detoxing from alcohol once and was successful for about 1 year until his wife left him recently.  He was kicked out of the house, states that he has been living on the street" sometimes even have to sleep in graveyards."  States that he ran out of insurance earlier in the year and has not been on any of his medications.  He does admit to intermittent Xanax use to help him sleep "which I get off of the street."  He states his last drink was approximately 24 hours ago.  He states that he is feeling homicidal towards his wife due to kicking him out.  He denies any tremors, vomiting, hallucinations, injuries or falls.     HPI  Past Medical History:  Diagnosis Date  . Alcohol abuse   . Anxiety   . Back pain   . CVA (cerebral infarction)   . Depression   . Diabetes mellitus   . Hypertension   . Migraines   . Stroke [facility])     Patient Active Problem List   Diagnosis Date Noted  . GREATER TROCHANTERIC BURSITIS 07/03/2007  . INCREASED BLOOD PRESSURE 07/03/2007  . BACK PAIN, LUMBAR 05/29/2007  . NUMBNESS 05/29/2007  . INSOMNIA, CHRONIC 05/13/2007  . HYPERLIPIDEMIA 01/13/2007  . ANXIETY DEPRESSION 01/13/2007  . SYNDROME, CHRONIC PAIN 01/13/2007  . CVA 01/13/2007  . ARTHRITIS 01/13/2007    Past Surgical History:  Procedure Laterality Date  . FOOT SURGERY    . MANDIBLE SURGERY          Home  Medications    Prior to Admission medications   Medication Sig Start Date End Date Taking? Authorizing Provider  gabapentin (NEURONTIN) 300 MG capsule Take 1 capsule (300 mg total) by mouth at bedtime. 06/02/18  Yes Lajean Saver, MD  glipiZIDE (GLUCOTROL) 5 MG tablet Take 1 tablet (5 mg total) by mouth daily before breakfast. 06/02/18  Yes Lajean Saver, MD  MELATONIN PO Take 5 mg by mouth at bedtime.   Yes [provider]  metFORMIN (GLUCOPHAGE) 500 MG tablet Take 1 tablet (500 mg total) by mouth 2 (two) times daily with a meal. 01/01/18  Yes Julianne Rice, MD  omeprazole (PRILOSEC) 20 MG capsule Take 1 capsule (20 mg total) by mouth daily. 02/17/19  Yes Maudie Flakes, MD  sucralfate (CARAFATE) 1 g tablet Take 1 tablet (1 g total) by mouth 4 (four) times daily as needed. 02/17/19  Yes Maudie Flakes, MD  ibuprofen (ADVIL,MOTRIN) 600 MG tablet Take 1 tablet (600 mg total) by mouth every 6 (six) hours as needed. Patient not taking: Reported on 05/24/2019 01/01/18   Julianne Rice, MD    Family History Family History  Problem Relation Age of Onset  . Stroke Father   . Stroke Sister   . Cancer Sister   . Heart failure Mother  Social History Social History   Tobacco Use  . Smoking status: Current Every Day Smoker    Packs/day: 2.00    Years: 20.00    Pack years: 40.00    Types: Cigarettes  . Smokeless tobacco: Never Used  Substance Use Topics  . Alcohol use: Yes    Comment: daily  . Drug use: Yes    Types: Marijuana    Comment: 2 times a week     Allergies   Ultram [tramadol] and Benadryl [diphenhydramine]   Review of Systems Review of Systems  Constitutional: Negative for appetite change, chills and fever.  HENT: Negative for ear pain, rhinorrhea, sneezing and sore throat.   Eyes: Negative for photophobia and visual disturbance.  Respiratory: Negative for cough, chest tightness, shortness of breath and wheezing.   Cardiovascular: Negative for chest pain  and palpitations.  Gastrointestinal: Negative for abdominal pain, blood in stool, constipation, diarrhea, nausea and vomiting.  Genitourinary: Negative for dysuria, hematuria and urgency.  Musculoskeletal: Negative for myalgias.  Skin: Negative for rash.  Neurological: Negative for dizziness, weakness and light-headedness.  Psychiatric/Behavioral: Positive for dysphoric mood, sleep disturbance and suicidal ideas. The patient is nervous/anxious.      Physical Exam Updated Vital Signs BP 124/74   Pulse 94   Temp 98.3 F (36.8 C) (Oral)   Resp 20   Ht 5\' 9"  (1.753 m)   Wt 71.7 kg   SpO2 98%   BMI 23.33 kg/m   Physical Exam Vitals signs and nursing note reviewed.  Constitutional:      General: He is not in acute distress.    Appearance: He is well-developed.  HENT:     Head: Normocephalic and atraumatic.     Nose: Nose normal.  Eyes:     General: No scleral icterus.       Left eye: No discharge.     Conjunctiva/sclera: Conjunctivae normal.  Neck:     Musculoskeletal: Normal range of motion and neck supple.  Cardiovascular:     Rate and Rhythm: Normal rate and regular rhythm.     Heart sounds: Normal heart sounds. No murmur. No friction rub. No gallop.   Pulmonary:     Effort: Pulmonary effort is normal. No respiratory distress.     Breath sounds: Normal breath sounds.  Abdominal:     General: Bowel sounds are normal. There is no distension.     Palpations: Abdomen is soft.     Tenderness: There is no abdominal tenderness. There is no guarding.  Musculoskeletal: Normal range of motion.  Skin:    General: Skin is warm and dry.     Findings: No rash.  Neurological:     Mental Status: He is alert.     Motor: No abnormal muscle tone.     Coordination: Coordination normal.  Psychiatric:        Mood and Affect: Mood is depressed.        Thought Content: Thought content includes homicidal and suicidal ideation. Thought content includes homicidal and suicidal plan.      Comments: Tearful.      ED Treatments / Results  Labs (all labs ordered are listed, but only abnormal results are displayed) Labs Reviewed  COMPREHENSIVE METABOLIC PANEL - Abnormal; Notable for the following components:      Result Value   Glucose, Bld 156 (*)    AST 55 (*)    ALT 80 (*)    Alkaline Phosphatase 140 (*)    All other components within  normal limits  ACETAMINOPHEN LEVEL - Abnormal; Notable for the following components:   Acetaminophen (Tylenol), Serum <10 (*)    All other components within normal limits  ETHANOL  CBC WITH DIFFERENTIAL/PLATELET  SALICYLATE LEVEL  RAPID URINE DRUG SCREEN, HOSP PERFORMED    EKG None  Radiology No results found.  Procedures Procedures (including critical care time)  Medications Ordered in ED Medications  LORazepam (ATIVAN) injection 0-4 mg ( Intravenous See Alternative 05/24/19 1935)    Or  LORazepam (ATIVAN) tablet 0-4 mg (2 mg Oral Given 05/24/19 1935)  LORazepam (ATIVAN) injection 0-4 mg (has no administration in time range)    Or  LORazepam (ATIVAN) tablet 0-4 mg (has no administration in time range)  thiamine (VITAMIN B-1) tablet 100 mg (100 mg Oral Given 05/24/19 1935)    Or  thiamine (B-1) injection 100 mg ( Intravenous See Alternative 05/24/19 1935)     Initial Impression / Assessment and Plan / ED Course  I have reviewed the triage vital signs and the nursing notes.  Pertinent labs & imaging results that were available during my care of the patient were reviewed by me and considered in my medical decision making (see chart for details).        53yo M with a past medical history of alcohol abuse, anxiety, hypertension, diabetes presents to ED for suicidal ideation, homicidal ideation, requesting detox from alcohol use.  Reports homicidal ideation towards his wife who kicked him out of the house states he has been homeless, having to sleep on the street with several plans for suicide.  His last alcoholic beverage  was 24 hours ago.  He denies any history of seizures, DTs and is not experiencing any tremors.  Medical screening lab work is unremarkable, slight elevation in LFTs could be due to his alcohol abuse.  Alcohol level is normal today.  CIWA protocol initiated.  Awaiting TTS recommendations, he is medically cleared for TTS evaluation.  Final Clinical Impressions(s) / ED Diagnoses   Final diagnoses:  Suicidal ideation  Homicidal ideation  Alcohol abuse    ED Discharge Orders    None       Delia Heady, PA-C 05/24/19 2250    Charlesetta Shanks, MD 05/27/19 1417

## 2019-05-24 NOTE — ED Triage Notes (Signed)
Patient requests detox from alcohol. Last drink was a 12 pack of beer at 5 pm yesterday. Patient reports homicidal thoughts toward his wife due to cheating and kicking him out of the house. Patient states he does not care about life any more and has many ways that he could kill himself. Patient tearful in triage.

## 2019-05-25 ENCOUNTER — Encounter (HOSPITAL_COMMUNITY): Payer: Self-pay | Admitting: Registered Nurse

## 2019-05-25 LAB — RAPID URINE DRUG SCREEN, HOSP PERFORMED
Amphetamines: NOT DETECTED
Barbiturates: NOT DETECTED
Benzodiazepines: POSITIVE — AB
Cocaine: NOT DETECTED
Opiates: NOT DETECTED
Tetrahydrocannabinol: POSITIVE — AB

## 2019-05-25 LAB — SARS CORONAVIRUS 2 BY RT PCR (HOSPITAL ORDER, PERFORMED IN ~~LOC~~ HOSPITAL LAB): SARS Coronavirus 2: NEGATIVE
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
## 2019-05-25 NOTE — ED Notes (Signed)
DCd off unit to [facility] per MD. Pt alert, calm, cooperative, no s/s of distress. DC information given to [place] driver for facility. Belongings given to [place] for [facility]. Pt ambulatory off unit, escorted by NT. Pt transported by [employer]

## 2019-05-25 NOTE — Discharge Summary (Addendum)
  Patient to be transferred to Danny [name] for inpatient psychiatric treatment  Patient's chart reviewed. Reviewed the information documented and agree with the treatment plan.  Buford Dresser, DO 05/25/19 5:24 PM

## 2019-05-25 NOTE — ED Notes (Signed)
TTS at bedside. 
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
## 2019-05-25 NOTE — ED Notes (Signed)
[place] report given to [employer]. Pt is ambulatory, dressed out, and wanded.

## 2019-05-25 NOTE — BH Assessment (Signed)
[facility] Assessment Progress Note  Per Buford Dresser, DO, this pt requires psychiatric hospitalization at this time.  At 15:02 Roderic Palau calls from [facility].  Pt has been accepted to their facility by Dr Dareen Piano to the [facility] Unit; please transport pt to the [facility] for intake.  Shuvon Rankin, FNP, concurs with this disposition, as does the pt who is currently under voluntary status.  Pt's nurse, Eustaquio Maize, has been notified, and agrees to call report to [phone].  Pt is to be transported via Stacey Drain, [place] Coordinator [phone]

## 2019-10-01 ENCOUNTER — Ambulatory Visit: Payer: Medicaid Other | Attending: Internal Medicine

## 2019-10-01 ENCOUNTER — Other Ambulatory Visit: Payer: Self-pay

## 2019-10-01 DIAGNOSIS — Z20822 Contact with and (suspected) exposure to covid-19: Secondary | ICD-10-CM

## 2019-10-02 ENCOUNTER — Telehealth: Payer: Self-pay

## 2019-10-02 LAB — NOVEL CORONAVIRUS, NAA: SARS-CoV-2, NAA: NOT DETECTED

## 2019-10-02 NOTE — Telephone Encounter (Signed)
Patient called in and received his negative covid test result  °

## 2020-02-19 DIAGNOSIS — F411 Generalized anxiety disorder: Secondary | ICD-10-CM | POA: Insufficient documentation

## 2020-02-19 DIAGNOSIS — M1990 Unspecified osteoarthritis, unspecified site: Secondary | ICD-10-CM | POA: Insufficient documentation

## 2020-10-06 ENCOUNTER — Other Ambulatory Visit: Payer: Medicaid Other

## 2020-10-06 DIAGNOSIS — Z20822 Contact with and (suspected) exposure to covid-19: Secondary | ICD-10-CM

## 2020-10-08 LAB — NOVEL CORONAVIRUS, NAA: SARS-CoV-2, NAA: DETECTED — AB

## 2020-10-08 LAB — SARS-COV-2, NAA 2 DAY TAT

## 2021-01-01 ENCOUNTER — Other Ambulatory Visit (HOSPITAL_COMMUNITY): Payer: Self-pay | Admitting: Adult Health

## 2021-01-01 ENCOUNTER — Other Ambulatory Visit: Payer: Self-pay | Admitting: Adult Health

## 2021-01-01 DIAGNOSIS — R1011 Right upper quadrant pain: Secondary | ICD-10-CM

## 2021-01-05 ENCOUNTER — Ambulatory Visit (HOSPITAL_COMMUNITY)
Admission: RE | Admit: 2021-01-05 | Discharge: 2021-01-05 | Disposition: A | Discharge status: Home / Self Care | Payer: Medicaid Other | Source: Ambulatory Visit | Attending: Adult Health | Admitting: Adult Health

## 2021-01-05 DIAGNOSIS — R1011 Right upper quadrant pain: Secondary | ICD-10-CM | POA: Insufficient documentation

## 2021-02-05 NOTE — Addendum Note (Signed)
Encounter addended by: Annie Paras on: 02/05/2021 12:36 PM  Actions taken: Letter saved

## 2021-03-29 DIAGNOSIS — R1011 Right upper quadrant pain: Secondary | ICD-10-CM | POA: Insufficient documentation

## 2021-03-29 DIAGNOSIS — E1140 Type 2 diabetes mellitus with diabetic neuropathy, unspecified: Secondary | ICD-10-CM | POA: Insufficient documentation

## 2021-03-29 DIAGNOSIS — R202 Paresthesia of skin: Secondary | ICD-10-CM | POA: Insufficient documentation

## 2021-06-05 NOTE — Progress Notes (Signed)
Referring Provider: Alanson Puls [facility] Primary Care Physician:  [facility] Primary Gastroenterologist:  Dr. Abbey Chatters  Chief Complaint  Patient presents with   Abdominal Pain    RUQ, more intense pain x 1 month now. Had [place] 12/2020    HPI:   Danny MCNIEL is a 55 y.o. male presenting today at the request of [facility] for abnormal imaging.  Patient has chief complaint of abdominal pain.  Reviewed RUQ ultrasound on file dated 01/05/2021: Impression: 1. The gallbladder wall is thickened measuring 5.2 mm. A positive Murphy's sign is reported. However, no stones, sludge, or pericholecystic fluid are identified. If there is concern for cholecystitis, recommend HIDA scan. 2. The liver demonstrates a micronodular contour suggesting the possibility of cirrhosis. There is a 1.4 cm hyperechoic mass in the left hepatic lobe. The appearance to the mass suggests the possibility of a hemangioma. However, this finding was not seen on CT imaging from June of 2020 and other etiologies are possible. Given the micronodular contour and the interval development of this mass, recommend an MRI of the abdomen with contrast. 3. Echogenic material was reported in the common bile duct. The image of the common bile duct is somewhat limited and the finding could be artifactual. Debris or sludge is a possibility. There is no shadowing to suggest a stone.  No follow-up imaging in our system or care everywhere.   Today:  Patient reports 1 year history of RUQ abdominal pain as well as pain across his mid abdomen.  Symptoms were initially intermittent, but have become essentially constant at this point and pretty severe.  He does not go a day without pain though symptoms do wax and wane in severity.  RUQ abdominal pain worsens with meals, specifically fatty meals.  Single episode of nausea/vomiting about 3 weeks ago after eating a greasy meal.  Pain across his mid abdomen is worsened with  movement and occasionally worsened by meals.  He describes it as sharp and burning.  Has never seen general surgery.  States he had similar problems a few years ago and was going to see surgery, but he did not have insurance and could not be seen.  He does have chronic history of reflux.  Currently on omeprazole 20 mg daily but breakthrough symptoms 2-3 times a week.  Worsened by spicy foods.  Also with dysphagia, typically to breads.  Symptoms occur a couple times a week.  Items passed with liquids.  Denies trouble with constipation or diarrhea.  Denies BRBPR or melena.  Occasional Advil.  Meloxicam twice a day for arthritis. Started 1 year ago.  No alcohol in 11 months. Used to drink 6 pack a week.  No history of IV or intranasal drug use. Uses Marijuana occasionally.   No prior EGD or colonoscopy.   Was not aware of possibility of cirrhosis or liver lesion.  Denies a swelling in his abdomen or lower extremities, changes in mental status, or yellowing of the eyes or skin.  No significant bruising.  Past Medical History:  Diagnosis Date   Alcohol abuse    Anxiety    Back pain    CVA (cerebral infarction)    Depression    Diabetes mellitus    Hypertension    Migraines    Stroke [facility])     Past Surgical History:  Procedure Laterality Date   FOOT SURGERY     MANDIBLE SURGERY      Current Outpatient Medications  Medication Sig Dispense  Refill   albuterol (VENTOLIN HFA) 108 (90 Base) MCG/ACT inhaler Inhale 2 puffs into the lungs every 6 (six) hours as needed for wheezing or shortness of breath.     busPIRone (BUSPAR) 15 MG tablet Take 15 mg by mouth 2 (two) times daily.     Cholecalciferol (VITAMIN D-3) 125 MCG (5000 UT) TABS Take by mouth daily.     citalopram (CELEXA) 20 MG tablet Take 20 mg by mouth daily.     gabapentin (NEURONTIN) 300 MG capsule Take 1 capsule (300 mg total) by mouth at bedtime. (Patient taking differently: Take 600 mg by mouth 2 (two) times daily.) 20  capsule 0   hydrOXYzine (ATARAX/VISTARIL) 25 MG tablet Take 25 mg by mouth 3 (three) times daily as needed.     meloxicam (MOBIC) 7.5 MG tablet Take 7.5 mg by mouth in the morning and at bedtime.     metFORMIN (GLUCOPHAGE) 500 MG tablet Take 1 tablet (500 mg total) by mouth 2 (two) times daily with a meal. 60 tablet 2   omeprazole (PRILOSEC) 40 MG capsule Take 1 capsule (40 mg total) by mouth daily before breakfast. 30 capsule 3   QUEtiapine (SEROQUEL) 100 MG tablet Take 100 mg by mouth at bedtime.     No current facility-administered medications for this visit.    Allergies as of 06/06/2021 - Review Complete 06/06/2021  Allergen Reaction Noted   Ultram [tramadol] Hives 09/10/2013   Benadryl [diphenhydramine] Itching and Rash 04/28/2014    Family History  Problem Relation Age of Onset   Heart failure Mother    Stroke Father    Stroke Sister    Cancer Sister        breast cancer   Colon cancer Neg Hx    Liver cancer Neg Hx     Social History   Socioeconomic History   Marital status: Legally Separated    Spouse name: Not on file   Number of children: Not on file   Years of education: Not on file   Highest education level: Not on file  Occupational History   Not on file  Tobacco Use   Smoking status: Every Day    Packs/day: 2.00    Years: 20.00    Pack years: 40.00    Types: Cigarettes   Smokeless tobacco: Never  Vaping Use   Vaping Use: Never used  Substance and Sexual Activity   Alcohol use: Not Currently    Comment: Quit 11 months ago. Used to drink about 6 pack a day. (documented 06/06/21)   Drug use: Yes    Types: Marijuana    Comment: occas   Sexual activity: Yes    Birth control/protection: None  Other Topics Concern   Not on file  Social History Narrative   Not on file   Social Determinants of Health   Financial Resource Strain: Not on file  Food Insecurity: Not on file  Transportation Needs: Not on file  Physical Activity: Not on file  Stress: Not  on file  Social Connections: Not on file  Intimate Partner Violence: Not on file    Review of Systems: Gen: Denies any fever, chills, cold or flulike symptoms, presyncope, syncope. CV: Denies chest pain, heart palpitations. Resp: Denies shortness of breath or cough. GI: See HPI GU : Denies urinary burning, urinary frequency, urinary hesitancy MS: Admits to joint pain in the setting of arthritis. Derm: Denies rash Psych: Denies depression, anxiety Heme: See HPI  Physical Exam: BP (!) 162/86  Pulse (!) 102   Temp 97.8 F (36.6 C)   Ht 5' 9"  (1.753 m)   Wt 181 lb 3.2 oz (82.2 kg)   BMI 26.76 kg/m  General:   Alert and oriented. Pleasant and cooperative. Well-nourished and well-developed.  Head:  Normocephalic and atraumatic. Eyes:  Without icterus, sclera clear and conjunctiva pink.  Ears:  Normal auditory acuity. Lungs:  Clear to auscultation bilaterally. No wheezes, rales, or rhonchi. No distress.  Heart:  S1, S2 present without murmurs appreciated.  Abdomen:  +BS, soft, and non-distended. Fairly significant TTP in the RUQ area, moderate TTP in epigastric area, mild to moderate TTP in LLQ. Minimal TTP in the LUQ and RLQ. No rebound or guarding. No HSM noted. No masses appreciated.  Rectal:  Deferred  Msk:  Symmetrical without gross deformities. Normal posture. Extremities:  Without edema. Neurologic:  Alert and  oriented x4;  grossly normal neurologically. Skin:  Intact without significant lesions or rashes. Psych:  Normal mood and affect.    Assessment: 55 year old male with history of chronic alcohol use in remission x11 months, anxiety, depression, arthritis, stroke, HTN, diabetes, migraines presenting today at the request of PCP for abnormal gallbladder and liver on ultrasound.  Patient also has multiple GI complaints including right upper quadrant and mid abdominal pain, reflux, dysphagia.  Patient had RUQ ultrasound in April 2022 which revealed gallbladder wall  thickening without evidence of acute cholecystitis with recommendations for HIDA scan if needed.  Also with micronodular contour of the liver suggesting possibility of cirrhosis.  1.4 cm hyperechoic mass suggestive of hemangioma, but recommended MRI.  Also with echogenic material in the common bile duct which could be artifactual, debris, or sludge.  No shadowing to suggest stone.  No follow-up imaging has been completed and no recent labs on file.  Last labs on file from 2020 with mildly elevated AST, ALT, and alk phos which seems to date back at least to 2018.   Notably, patient reports 1 year history of RUQ and mid abdominal pain that has been worsening, now constant.  Symptoms are worsened by meals and with certain movement.  No significant nausea/vomiting and no fever.  He does have chronic reflux that is not adequately managed on omeprazole 20 mg daily and also takes meloxicam twice daily for arthritis.  Denies BRBPR, melena, constipation, diarrhea.  No prior EGD or colonoscopy.  On exam, he has fairly significant TTP in the RUQ area, moderate TTP in epigastric area, mild to moderate TTP in LLQ. Minimal TTP in the LUQ and RLQ. No rebound or guarding.   Differentials include acalculous cholecystitis, microlithiasis/sludge in CBD, PUD, gastritis, esophagitis, duodenitis, diverticulitis.  Though there is some question of cirrhosis, he does not appear to have any ascites, thus less likely SBP. Due to significant multifocal abdominal pain, I have recommended CT A/P with contrast as well as updating labs with further recommendations to follow.  I will also increase his omeprazole to 40 mg daily.   Liver lesion:  Possible hemangioma on RUQ ultrasound, but needs MRI for further characterization.  We will arrange MRI in the near future pending CT for abdominal pain as discussed above.  We will also check AFP in light of possible cirrhosis.  Possible cirrhosis:  RUQ ultrasound April 2022 with micronodular  contour of the liver.  Suspect etiology would be alcohol. Patient has no signs or symptoms of advanced/decompensated liver disease.  He does have history of mildly elevated LFTs discussed below, but no recent labs on  file.  Previously, platelets within normal limits. We are arranging CT A/P due to abdominal pain discussed above.  If CT is not suggestive of cirrhosis, will obtain elastography for confirmation.  I will also go ahead and update CBC, CMP, INR, and hepatitis A/B serologies.   Elevated LFTs:  Mild elevation of AST/ALT/alk phos dates back to 2018 at least.  Possibly secondary to chronic alcohol abuse, now in remission x11 months.  Could also be related to possible cirrhosis and/or gallbladder/biliary etiology in light of abnormal ultrasound findings discussed above.  Denies history of IV or intranasal drug use.  No recent labs on file.  We will update LFTs and also check hepatitis B and C serologies. May need further serologies pending results.   GERD:  Not adequately controlled on omeprazole 20 mg daily.  Will increase to 40 mg daily.  Dysphagia:  Intermittent solid food dysphagia.  This is in the setting of chronic GERD.  Differentials include esophageal web, ring, stricture, EOE, less likely malignancy.  Patient will need EGD for further evaluation.  Colon cancer screening:  No prior colonoscopy. No family history of colon cancer. Currently with abdominal pain discussed above, otherwise, denies brbpr, melena, constipation, or diarrhea. Hopefully we will be able to arrange a colonoscopy in the near future once we have ruled out acute pathology contributing to his abdominal pain.  Plan: CBC, CMP, INR, AFP, hepatitis B surface antibody, hepatitis B surface antigen, hepatitis B core antibody, hepatitis C antibody. CT A/P with contrast ASAP.  Patient requesting to schedule on 10/5. Consider elastography of the liver if CT does not confirm cirrhosis. Increase omeprazole to 40 mg  daily. Counseled on GERD diet/lifestyle. Limit NSAIDs as much as possible.   He will need EGD with possible dilation and colonoscopy with propofol in the near future with Dr. Abbey Chatters.  Timing pending CT results.  Dysphagia precautions discussed (see AVS).    Aliene Altes, PA-C [facility] 06/06/2021

## 2021-06-06 ENCOUNTER — Ambulatory Visit (INDEPENDENT_AMBULATORY_CARE_PROVIDER_SITE_OTHER): Payer: Medicaid Other | Admitting: Gastroenterology

## 2021-06-06 ENCOUNTER — Other Ambulatory Visit: Payer: Self-pay

## 2021-06-06 ENCOUNTER — Encounter: Payer: Self-pay | Admitting: Gastroenterology

## 2021-06-06 VITALS — BP 162/86 | HR 102 | Temp 97.8°F | Ht 69.0 in | Wt 181.2 lb

## 2021-06-06 DIAGNOSIS — R52 Pain, unspecified: Secondary | ICD-10-CM | POA: Insufficient documentation

## 2021-06-06 DIAGNOSIS — R1084 Generalized abdominal pain: Secondary | ICD-10-CM | POA: Diagnosis not present

## 2021-06-06 DIAGNOSIS — R1310 Dysphagia, unspecified: Secondary | ICD-10-CM

## 2021-06-06 DIAGNOSIS — K219 Gastro-esophageal reflux disease without esophagitis: Secondary | ICD-10-CM | POA: Diagnosis not present

## 2021-06-06 DIAGNOSIS — R7989 Other specified abnormal findings of blood chemistry: Secondary | ICD-10-CM

## 2021-06-06 DIAGNOSIS — R932 Abnormal findings on diagnostic imaging of liver and biliary tract: Secondary | ICD-10-CM

## 2021-06-06 DIAGNOSIS — Z1211 Encounter for screening for malignant neoplasm of colon: Secondary | ICD-10-CM

## 2021-06-06 MED ORDER — OMEPRAZOLE 40 MG PO CPDR: 40.0000 mg | DELAYED_RELEASE_CAPSULE | Freq: Every day | ORAL | 3 refills | Status: DC

## 2021-06-06 NOTE — Patient Instructions (Signed)
Have labs completed at [place].  We will arrange for you to have a CT of your abdomen and pelvis as soon as possible.  As you requested for this to be scheduled on Wednesday, we will do our best to make this happen.  Increase omeprazole to 40 mg daily 30 minutes before breakfast.  I have sent a new prescription to your pharmacy.  Follow a GERD diet:  Avoid fried, fatty, greasy, spicy, citrus foods. Avoid caffeine and carbonated beverages. Avoid chocolate. Try eating 4-6 small meals a day rather than 3 large meals. Do not eat within 3 hours of laying down. Prop head of bed up on wood or bricks to create a 6 inch incline.  To help with your swallowing problems, I recommend you chopped meats finely, eat slowly, take small bites, chew thoroughly, and drink plenty of liquids throughout meals.  If something ever gets stuck in your esophagus and will not come up or go down, you should proceed to the emergency room.  Further recommendations to follow CT and lab results.  Aliene Altes, PA-C [facility]

## 2021-06-13 ENCOUNTER — Other Ambulatory Visit: Payer: Self-pay

## 2021-06-13 ENCOUNTER — Ambulatory Visit (HOSPITAL_COMMUNITY)
Admission: RE | Admit: 2021-06-13 | Discharge: 2021-06-13 | Disposition: A | Discharge status: Home / Self Care | Payer: Medicaid Other | Source: Ambulatory Visit | Attending: Gastroenterology | Admitting: Gastroenterology

## 2021-06-13 ENCOUNTER — Other Ambulatory Visit: Payer: Self-pay | Admitting: Gastroenterology

## 2021-06-13 DIAGNOSIS — R1084 Generalized abdominal pain: Secondary | ICD-10-CM | POA: Insufficient documentation

## 2021-06-13 DIAGNOSIS — R932 Abnormal findings on diagnostic imaging of liver and biliary tract: Secondary | ICD-10-CM

## 2021-06-13 MED ORDER — IOHEXOL 300 MG/ML  SOLN
100.0000 mL | Freq: Once | INTRAMUSCULAR | Status: AC | PRN
  Administered 2021-06-13: 80 mL via INTRAVENOUS

## 2021-06-15 ENCOUNTER — Other Ambulatory Visit: Payer: Self-pay | Admitting: *Deleted

## 2021-06-15 DIAGNOSIS — K7460 Unspecified cirrhosis of liver: Secondary | ICD-10-CM

## 2021-06-15 LAB — CBC WITH DIFFERENTIAL/PLATELET
Absolute Monocytes: 428 cells/uL (ref 200–950)
Basophils Absolute: 68 cells/uL (ref 0–200)
Basophils Relative: 1.2 %
Eosinophils Absolute: 148 cells/uL (ref 15–500)
Eosinophils Relative: 2.6 %
HCT: 47.7 % (ref 38.5–50.0)
Hemoglobin: 15.9 g/dL (ref 13.2–17.1)
Lymphs Abs: 1693 cells/uL (ref 850–3900)
MCH: 29.2 pg (ref 27.0–33.0)
MCHC: 33.3 g/dL (ref 32.0–36.0)
MCV: 87.7 fL (ref 80.0–100.0)
MPV: 12.8 fL — ABNORMAL HIGH (ref 7.5–12.5)
Monocytes Relative: 7.5 %
Neutro Abs: 3363 cells/uL (ref 1500–7800)
Neutrophils Relative %: 59 %
Platelets: 112 10*3/uL — ABNORMAL LOW (ref 140–400)
RBC: 5.44 10*6/uL (ref 4.20–5.80)
RDW: 12 % (ref 11.0–15.0)
Total Lymphocyte: 29.7 %
WBC: 5.7 10*3/uL (ref 3.8–10.8)

## 2021-06-15 LAB — COMPLETE METABOLIC PANEL WITH GFR
AG Ratio: 1.2 (calc) (ref 1.0–2.5)
ALT: 71 U/L — ABNORMAL HIGH (ref 9–46)
AST: 61 U/L — ABNORMAL HIGH (ref 10–35)
Albumin: 3.9 g/dL (ref 3.6–5.1)
Alkaline phosphatase (APISO): 173 U/L — ABNORMAL HIGH (ref 35–144)
BUN: 10 mg/dL (ref 7–25)
CO2: 22 mmol/L (ref 20–32)
Calcium: 9.2 mg/dL (ref 8.6–10.3)
Chloride: 101 mmol/L (ref 98–110)
Creat: 0.83 mg/dL (ref 0.70–1.30)
Globulin: 3.2 g/dL (calc) (ref 1.9–3.7)
Glucose, Bld: 398 mg/dL — ABNORMAL HIGH (ref 65–99)
Potassium: 4 mmol/L (ref 3.5–5.3)
Sodium: 134 mmol/L — ABNORMAL LOW (ref 135–146)
Total Bilirubin: 0.8 mg/dL (ref 0.2–1.2)
Total Protein: 7.1 g/dL (ref 6.1–8.1)
eGFR: 103 mL/min/{1.73_m2} (ref >=60)

## 2021-06-15 LAB — HEPATITIS B SURFACE ANTIGEN: Hepatitis B Surface Ag: NONREACTIVE

## 2021-06-15 LAB — HCV RNA,QUANTITATIVE REAL TIME PCR
HCV Quantitative Log: 6.18 Log IU/mL — ABNORMAL HIGH
HCV RNA, PCR, QN: 1520000 IU/mL — ABNORMAL HIGH

## 2021-06-15 LAB — AFP TUMOR MARKER: AFP-Tumor Marker: 12.4 ng/mL — ABNORMAL HIGH (ref <6.1)

## 2021-06-15 LAB — PROTIME-INR
INR: 1.1
Prothrombin Time: 10.7 s (ref 9.0–11.5)

## 2021-06-15 LAB — HEPATITIS B SURFACE ANTIBODY,QUALITATIVE: Hep B S Ab: NONREACTIVE

## 2021-06-15 LAB — HEPATITIS C ANTIBODY
Hepatitis C Ab: REACTIVE — AB
SIGNAL TO CUT-OFF: 29.9 — ABNORMAL HIGH (ref <1.00)

## 2021-06-15 LAB — HEPATITIS B CORE ANTIBODY, TOTAL: Hep B Core Total Ab: NONREACTIVE

## 2021-06-15 NOTE — Addendum Note (Signed)
Addended by: Inda Castle on: 06/15/2021 10:39 AM   Modules accepted: Orders

## 2021-06-18 ENCOUNTER — Other Ambulatory Visit: Payer: Self-pay | Admitting: *Deleted

## 2021-06-18 DIAGNOSIS — K7460 Unspecified cirrhosis of liver: Secondary | ICD-10-CM

## 2021-06-19 ENCOUNTER — Other Ambulatory Visit: Payer: Self-pay

## 2021-06-19 DIAGNOSIS — K7460 Unspecified cirrhosis of liver: Secondary | ICD-10-CM

## 2021-06-19 DIAGNOSIS — R772 Abnormality of alphafetoprotein: Secondary | ICD-10-CM

## 2021-06-19 DIAGNOSIS — K769 Liver disease, unspecified: Secondary | ICD-10-CM

## 2021-06-19 LAB — HEPATITIS A ANTIBODY, TOTAL: hep A Total Ab: POSITIVE — AB

## 2021-06-19 LAB — HIV ANTIBODY (ROUTINE TESTING W REFLEX): HIV Screen 4th Generation wRfx: NONREACTIVE

## 2021-06-19 MED ORDER — CLENPIQ 10-3.5-12 MG-GM -GM/160ML PO SOLN: 1.0000 | Freq: Once | ORAL | 0 refills | Status: AC

## 2021-06-21 LAB — HCV RNA QUANT RFLX ULTRA OR GENOTYP
HCV Quant Baseline: 1080000 IU/mL
HCV log10: 6.033 log10 IU/mL

## 2021-06-21 LAB — HEPATITIS C GENOTYPE

## 2021-06-29 ENCOUNTER — Ambulatory Visit (HOSPITAL_COMMUNITY)
Admission: RE | Admit: 2021-06-29 | Discharge: 2021-06-29 | Disposition: A | Discharge status: Home / Self Care | Payer: Medicaid Other | Source: Ambulatory Visit | Attending: Gastroenterology | Admitting: Gastroenterology

## 2021-06-29 ENCOUNTER — Other Ambulatory Visit: Payer: Self-pay

## 2021-06-29 DIAGNOSIS — K769 Liver disease, unspecified: Secondary | ICD-10-CM

## 2021-06-29 DIAGNOSIS — K7460 Unspecified cirrhosis of liver: Secondary | ICD-10-CM | POA: Diagnosis present

## 2021-06-29 DIAGNOSIS — R772 Abnormality of alphafetoprotein: Secondary | ICD-10-CM | POA: Insufficient documentation

## 2021-06-29 MED ORDER — GADOBUTROL 1 MMOL/ML IV SOLN
8.0000 mL | Freq: Once | INTRAVENOUS | Status: AC | PRN
  Administered 2021-06-29: 8 mL via INTRAVENOUS

## 2021-07-03 ENCOUNTER — Other Ambulatory Visit: Payer: Self-pay | Admitting: *Deleted

## 2021-07-03 DIAGNOSIS — K7460 Unspecified cirrhosis of liver: Secondary | ICD-10-CM

## 2021-07-10 ENCOUNTER — Other Ambulatory Visit: Payer: Self-pay

## 2021-07-10 ENCOUNTER — Encounter (HOSPITAL_COMMUNITY): Payer: Self-pay | Admitting: *Deleted

## 2021-07-10 ENCOUNTER — Emergency Department (HOSPITAL_COMMUNITY): Payer: Medicaid Other

## 2021-07-10 ENCOUNTER — Emergency Department (HOSPITAL_COMMUNITY)
Admission: EM | Admit: 2021-07-10 | Discharge: 2021-07-11 | Disposition: A | Discharge status: Home / Self Care | Payer: Medicaid Other | Attending: Emergency Medicine | Admitting: Emergency Medicine

## 2021-07-10 DIAGNOSIS — Z7984 Long term (current) use of oral hypoglycemic drugs: Secondary | ICD-10-CM | POA: Diagnosis not present

## 2021-07-10 DIAGNOSIS — M79671 Pain in right foot: Secondary | ICD-10-CM | POA: Diagnosis not present

## 2021-07-10 DIAGNOSIS — E119 Type 2 diabetes mellitus without complications: Secondary | ICD-10-CM | POA: Insufficient documentation

## 2021-07-10 DIAGNOSIS — Z79899 Other long term (current) drug therapy: Secondary | ICD-10-CM | POA: Insufficient documentation

## 2021-07-10 DIAGNOSIS — M25561 Pain in right knee: Secondary | ICD-10-CM | POA: Diagnosis present

## 2021-07-10 DIAGNOSIS — I10 Essential (primary) hypertension: Secondary | ICD-10-CM | POA: Insufficient documentation

## 2021-07-10 DIAGNOSIS — R296 Repeated falls: Secondary | ICD-10-CM | POA: Insufficient documentation

## 2021-07-10 DIAGNOSIS — W06XXXA Fall from bed, initial encounter: Secondary | ICD-10-CM | POA: Diagnosis not present

## 2021-07-10 DIAGNOSIS — F17210 Nicotine dependence, cigarettes, uncomplicated: Secondary | ICD-10-CM | POA: Diagnosis not present

## 2021-07-10 LAB — CBC
HCT: 43.1 % (ref 39.0–52.0)
Hemoglobin: 14.6 g/dL (ref 13.0–17.0)
MCH: 29.6 pg (ref 26.0–34.0)
MCHC: 33.9 g/dL (ref 30.0–36.0)
MCV: 87.2 fL (ref 80.0–100.0)
Platelets: 151 10*3/uL (ref 150–400)
RBC: 4.94 MIL/uL (ref 4.22–5.81)
RDW: 12.7 % (ref 11.5–15.5)
WBC: 7.4 10*3/uL (ref 4.0–10.5)
nRBC: 0 % (ref 0.0–0.2)

## 2021-07-10 LAB — BASIC METABOLIC PANEL
Anion gap: 7 (ref 5–15)
BUN: 18 mg/dL (ref 6–20)
CO2: 26 mmol/L (ref 22–32)
Calcium: 8.7 mg/dL — ABNORMAL LOW (ref 8.9–10.3)
Chloride: 99 mmol/L (ref 98–111)
Creatinine, Ser: 0.8 mg/dL (ref 0.61–1.24)
GFR, Estimated: >60 mL/min (ref >60)
Glucose, Bld: 140 mg/dL — ABNORMAL HIGH (ref 70–99)
Potassium: 3.7 mmol/L (ref 3.5–5.1)
Sodium: 132 mmol/L — ABNORMAL LOW (ref 135–145)

## 2021-07-10 LAB — CBG MONITORING, ED
Glucose-Capillary: 147 mg/dL — ABNORMAL HIGH (ref 70–99)
Glucose-Capillary: 133 mg/dL — ABNORMAL HIGH (ref 70–99)

## 2021-07-10 NOTE — ED Notes (Signed)
Call daughter when pt is discharged per pt's wife.

## 2021-07-10 NOTE — ED Triage Notes (Signed)
Pt states he has had 4 falls since Friday night. Pt says since falling on Saturday he has a pain in his right foot, numbness in his right thigh. Good strength in his leg, but says he cannot lift his leg since then. Also, later he hit his head on a car door.   No incidences did he have LOC. Denies that he has had headaches or dizziness prior to these falls. Alert and oriented, clear speech.

## 2021-07-10 NOTE — ED Notes (Signed)
Signature pad not working during triage, pt verbalized understanding of necessity of MSE, risk/benefits.

## 2021-07-11 MED ORDER — HYDROCODONE-ACETAMINOPHEN 5-325 MG PO TABS
1.0000 | ORAL_TABLET | Freq: Once | ORAL | Status: AC
  Administered 2021-07-11: 1 via ORAL
  Filled 2021-07-11: qty 1

## 2021-07-11 MED ORDER — NAPROXEN 500 MG PO TABS: 500.0000 mg | ORAL_TABLET | Freq: Two times a day (BID) | ORAL | 0 refills | Status: DC

## 2021-07-11 NOTE — ED Notes (Signed)
Pt says he normally does use cane, walker or crutches. Assisted pt at bedside to assess mobility.Stood pt up on the side of the bed, he shuffled his legs forward and then fell back into the bed. I stood him up again, unable to have full ROM of the RLE to ambulate into halls.  Dr. Dina Rich made aware.

## 2021-07-11 NOTE — Discharge Instructions (Signed)
DifferentialYou were seen today for falls and some right knee discomfort.  Your x-rays do not show any evidence of fracture.  You could have a ligamentous injury.  Given difficulty with extension at the knee, you could also have an occult quadriceps or patellar injury.  Use knee brace and crutches.  Follow-up with orthopedics.

## 2021-07-11 NOTE — ED Provider Notes (Signed)
[facility] EMERGENCY DEPARTMENT Provider Note   CSN: [id number] Arrival date & time: 07/10/21  1812     History Chief Complaint  Patient presents with   Lytle Michaels    Danny [name] is a 55 y.o. male.  HPI     This is a 55 year old male with a history of alcohol abuse, CVA, diabetes, hypertension who presents with right leg and knee pain.  Patient is a generally poor historian.  He states that he fell out of bed on Friday night.  He felt fine on Saturday.  However, he was at his daughter's gender reveal party and stood up and "I just fell to the ground."  He reports his legs "gave out on him."  He has reported worsening right knee and right foot pain.  He also reports difficulty with extension of the right leg at the knee.  He rates his pain at 5 out of 10.  He has had difficulty ambulating.  Denies weakness, numbness, tingling or other strokelike symptoms.    Past Medical History:  Diagnosis Date   Alcohol abuse    Anxiety    Back pain    CVA (cerebral infarction)    Depression    Diabetes mellitus    Hypertension    Migraines    Stroke [facility])     Patient Active Problem List   Diagnosis Date Noted   Generalized abdominal pain 06/06/2021   Dysphagia 06/06/2021   Gastroesophageal reflux disease 06/06/2021   Abnormal liver ultrasound 06/06/2021   Colon cancer screening 06/06/2021   Elevated LFTs 06/06/2021   GREATER TROCHANTERIC BURSITIS 07/03/2007   INCREASED BLOOD PRESSURE 07/03/2007   BACK PAIN, LUMBAR 05/29/2007   NUMBNESS 05/29/2007   INSOMNIA, CHRONIC 05/13/2007   HYPERLIPIDEMIA 01/13/2007   ANXIETY DEPRESSION 01/13/2007   SYNDROME, CHRONIC PAIN 01/13/2007   CVA 01/13/2007   ARTHRITIS 01/13/2007    Past Surgical History:  Procedure Laterality Date   FOOT SURGERY     MANDIBLE SURGERY         Family History  Problem Relation Age of Onset   Heart failure Mother    Stroke Father    Stroke Sister    Cancer Sister        breast cancer   Colon cancer Neg  Hx    Liver cancer Neg Hx     Social History   Tobacco Use   Smoking status: Every Day    Packs/day: 2.00    Years: 20.00    Pack years: 40.00    Types: Cigarettes   Smokeless tobacco: Never  Vaping Use   Vaping Use: Never used  Substance Use Topics   Alcohol use: Not Currently    Comment: Quit 11 months ago. Used to drink about 6 pack a day. (documented 06/06/21)   Drug use: Yes    Types: Marijuana    Comment: occas    Home Medications Prior to Admission medications   Medication Sig Start Date End Date Taking? Authorizing Provider  naproxen (NAPROSYN) 500 MG tablet Take 1 tablet (500 mg total) by mouth 2 (two) times daily. 07/11/21  Yes Annabelle Rexroad, Barbette Hair, MD  albuterol (VENTOLIN HFA) 108 (90 Base) MCG/ACT inhaler Inhale 2 puffs into the lungs every 6 (six) hours as needed for wheezing or shortness of breath.    [provider]  busPIRone (BUSPAR) 15 MG tablet Take 15 mg by mouth 2 (two) times daily.    [provider]  Cholecalciferol (VITAMIN D-3) 125 MCG (5000 UT)  TABS Take by mouth daily.    [provider]  citalopram (CELEXA) 20 MG tablet Take 20 mg by mouth daily.    [provider]  gabapentin (NEURONTIN) 300 MG capsule Take 1 capsule (300 mg total) by mouth at bedtime. Patient taking differently: Take 600 mg by mouth 2 (two) times daily. 06/02/18   Lajean Saver, MD  hydrOXYzine (ATARAX/VISTARIL) 25 MG tablet Take 25 mg by mouth 3 (three) times daily as needed.    [provider]  meloxicam (MOBIC) 7.5 MG tablet Take 7.5 mg by mouth in the morning and at bedtime.    [provider]  metFORMIN (GLUCOPHAGE) 500 MG tablet Take 1 tablet (500 mg total) by mouth 2 (two) times daily with a meal. 01/01/18   Julianne Rice, MD  omeprazole (PRILOSEC) 40 MG capsule Take 1 capsule (40 mg total) by mouth daily before breakfast. 06/06/21   Erenest Rasher, PA-C  QUEtiapine (SEROQUEL) 100 MG tablet Take 100 mg by mouth at bedtime.     [provider]    Allergies    Ultram [tramadol] and Benadryl [diphenhydramine]  Review of Systems   Review of Systems  Constitutional:  Negative for fever.  Respiratory:  Negative for shortness of breath.   Cardiovascular:  Negative for chest pain.  Gastrointestinal:  Negative for abdominal pain.  Genitourinary:  Negative for dysuria.  Musculoskeletal:        Right knee pain, right leg pain  Neurological:  Negative for weakness and numbness.  All other systems reviewed and are negative.  Physical Exam Updated Vital Signs BP (!) 153/93 (BP Location: Left Arm)   Pulse 88   Temp 98.2 F (36.8 C) (Oral)   Resp 18   Ht 1.753 m (5\' 9" )   Wt 81.6 kg   SpO2 95%   BMI 26.58 kg/m   Physical Exam Vitals and nursing note reviewed.  Constitutional:      Appearance: He is well-developed. He is not ill-appearing.  HENT:     Head: Normocephalic and atraumatic.     Nose: Nose normal.     Mouth/Throat:     Mouth: Mucous membranes are moist.  Eyes:     Pupils: Pupils are equal, round, and reactive to light.  Cardiovascular:     Rate and Rhythm: Normal rate and regular rhythm.     Heart sounds: Normal heart sounds. No murmur heard. Pulmonary:     Effort: Pulmonary effort is normal. No respiratory distress.     Breath sounds: Normal breath sounds. No wheezing.  Abdominal:     General: Bowel sounds are normal.     Palpations: Abdomen is soft.     Tenderness: There is no abdominal tenderness. There is no rebound.  Musculoskeletal:     Cervical back: Neck supple.     Comments: Patient able to flex at the right knee without difficulty; however, has difficulty with knee extension and straight leg raise, no joint line tenderness, obvious deformity, or effusion, neurovascular intact distally   Lymphadenopathy:     Cervical: No cervical adenopathy.  Skin:    General: Skin is warm and dry.  Neurological:     Mental Status: He is alert and oriented to person, place, and time.      Comments: Cranial nerves II through XII intact, 5 out of 5 strength bilateral upper extremities,5 out of 5 strength with plantar and dorsiflexion of the bilateral feet, difficult to assess strength with flexion of the hip and knee secondary  to general difficulty performing these tasks.  Psychiatric:        Mood and Affect: Mood normal.    ED Results / Procedures / Treatments   Labs (all labs ordered are listed, but only abnormal results are displayed) Labs Reviewed  BASIC METABOLIC PANEL - Abnormal; Notable for the following components:      Result Value   Sodium 132 (*)    Glucose, Bld 140 (*)    Calcium 8.7 (*)    All other components within normal limits  CBG MONITORING, ED - Abnormal; Notable for the following components:   Glucose-Capillary 147 (*)    All other components within normal limits  CBG MONITORING, ED - Abnormal; Notable for the following components:   Glucose-Capillary 133 (*)    All other components within normal limits  CBC  URINALYSIS, ROUTINE W REFLEX MICROSCOPIC    EKG EKG Interpretation  Date/Time:  Tuesday July 10 2021 19:16:35 EDT Ventricular Rate:  98 PR Interval:  166 QRS Duration: 86 QT Interval:  354 QTC Calculation: 451 R Axis:   26 Text Interpretation: Normal sinus rhythm Possible Left atrial enlargement Nonspecific ST and T wave abnormality Abnormal ECG Confirmed by Aletta Edouard [phone]) on 07/10/2021 7:27:13 PM  Radiology DG Knee Complete 4 Views Right  Result Date: 07/11/2021 CLINICAL DATA:  Injury. EXAM: RIGHT FOOT COMPLETE - 3+ VIEW; RIGHT KNEE - COMPLETE 4+ VIEW COMPARISON:  None. FINDINGS: Right knee: There is no evidence of fracture or dislocation. There is no evidence of arthropathy or other focal bone abnormality. Vascular calcifications are present in the soft tissues. Soft tissues are otherwise within normal limits. Right foot: There is no evidence for acute fracture or dislocation. Calcaneal screw is present likely related  to prior Achilles tendon repair. There is some nonspecific soft tissue calcifications adjacent to the inferior and posterior calcaneus. Vascular calcifications are also present in the soft tissues. There are moderate degenerative changes at the first metatarsophalangeal joint with joint space narrowing and osteophyte formation. IMPRESSION: 1. No acute fracture or dislocation of the right knee or right foot. 2. Postsurgical changes of the calcaneus. 3. Degenerative changes at the first metatarsophalangeal joint. Electronically Signed   By: Ronney Asters M.D.   On: 07/11/2021 00:00   DG Foot Complete Right  Result Date: 07/11/2021 CLINICAL DATA:  Injury. EXAM: RIGHT FOOT COMPLETE - 3+ VIEW; RIGHT KNEE - COMPLETE 4+ VIEW COMPARISON:  None. FINDINGS: Right knee: There is no evidence of fracture or dislocation. There is no evidence of arthropathy or other focal bone abnormality. Vascular calcifications are present in the soft tissues. Soft tissues are otherwise within normal limits. Right foot: There is no evidence for acute fracture or dislocation. Calcaneal screw is present likely related to prior Achilles tendon repair. There is some nonspecific soft tissue calcifications adjacent to the inferior and posterior calcaneus. Vascular calcifications are also present in the soft tissues. There are moderate degenerative changes at the first metatarsophalangeal joint with joint space narrowing and osteophyte formation. IMPRESSION: 1. No acute fracture or dislocation of the right knee or right foot. 2. Postsurgical changes of the calcaneus. 3. Degenerative changes at the first metatarsophalangeal joint. Electronically Signed   By: Ronney Asters M.D.   On: 07/11/2021 00:00    Procedures Procedures   Medications Ordered in ED Medications  HYDROcodone-acetaminophen (NORCO/VICODIN) 5-325 MG per tablet 1 tablet (1 tablet Oral Given 07/11/21 0330)    ED Course  I have reviewed the triage vital signs and the nursing  notes.  Pertinent labs & imaging results that were available during my care of the patient were reviewed by me and considered in my medical decision making (see chart for details).    MDM Rules/Calculators/A&P                           Patient presents with right knee and foot pain.  Also reports recent falls and "legs giving out."  He is a very difficult historian.  It is unclear whether pain is hindering his range of motion of the right leg or he has true weakness.  I do not appreciate any objective weakness.  He appears to be able to fire his muscles appropriately.  He does have difficulty with extension of the right knee and straight leg raise because of this.  Question occult patellar or quadriceps injury.  X-rays obtained and show no evidence of acute fractures of the right knee or right foot.  He had difficulty ambulating on his own; he reports "I feel like my knee buckles backwards."  He was provided with a knee brace and crutches and was able to use these effectively.  I have low suspicion for neurologic etiology such as stroke.  Recommend orthopedics follow-up.  Labs obtained and reviewed and are largely reassuring.  After history, exam, and medical workup I feel the patient has been appropriately medically screened and is safe for discharge home. Pertinent diagnoses were discussed with the patient. Patient was given return precautions.  Final Clinical Impression(s) / ED Diagnoses Final diagnoses:  Acute pain of right knee  Recurrent falls    Rx / DC Orders ED Discharge Orders          Ordered    naproxen (NAPROSYN) 500 MG tablet  2 times daily        07/11/21 0351             Graylen Noboa, Barbette Hair, MD 07/11/21 [phone]

## 2021-07-19 ENCOUNTER — Encounter (HOSPITAL_COMMUNITY): Payer: Medicaid Other

## 2021-07-23 ENCOUNTER — Ambulatory Visit (HOSPITAL_COMMUNITY): Admit: 2021-07-23 | Payer: Medicaid Other

## 2021-07-23 ENCOUNTER — Encounter (HOSPITAL_COMMUNITY): Payer: Self-pay

## 2021-07-23 SURGERY — COLONOSCOPY WITH PROPOFOL
Anesthesia: Monitor Anesthesia Care

## 2021-08-08 ENCOUNTER — Other Ambulatory Visit: Payer: Self-pay | Admitting: Nurse Practitioner

## 2021-08-08 ENCOUNTER — Other Ambulatory Visit (HOSPITAL_COMMUNITY): Payer: Self-pay | Admitting: Nurse Practitioner

## 2021-08-08 DIAGNOSIS — I10 Essential (primary) hypertension: Secondary | ICD-10-CM | POA: Insufficient documentation

## 2021-08-08 DIAGNOSIS — B1920 Unspecified viral hepatitis C without hepatic coma: Secondary | ICD-10-CM | POA: Insufficient documentation

## 2021-08-08 DIAGNOSIS — B182 Chronic viral hepatitis C: Secondary | ICD-10-CM | POA: Insufficient documentation

## 2021-08-08 DIAGNOSIS — K7469 Other cirrhosis of liver: Secondary | ICD-10-CM | POA: Insufficient documentation

## 2021-08-08 DIAGNOSIS — C220 Liver cell carcinoma: Secondary | ICD-10-CM | POA: Insufficient documentation

## 2021-08-08 DIAGNOSIS — J449 Chronic obstructive pulmonary disease, unspecified: Secondary | ICD-10-CM | POA: Insufficient documentation

## 2021-08-08 DIAGNOSIS — E119 Type 2 diabetes mellitus without complications: Secondary | ICD-10-CM | POA: Insufficient documentation

## 2021-08-08 DIAGNOSIS — C229 Malignant neoplasm of liver, not specified as primary or secondary: Secondary | ICD-10-CM | POA: Insufficient documentation

## 2021-08-08 DIAGNOSIS — M25561 Pain in right knee: Secondary | ICD-10-CM | POA: Insufficient documentation

## 2021-08-14 ENCOUNTER — Encounter: Payer: Self-pay | Admitting: Orthopaedic Surgery

## 2021-08-14 ENCOUNTER — Ambulatory Visit: Payer: Medicaid Other | Admitting: Orthopaedic Surgery

## 2021-08-14 ENCOUNTER — Other Ambulatory Visit: Payer: Self-pay

## 2021-08-14 VITALS — BP 174/93 | HR 109 | Ht 69.0 in | Wt 169.0 lb

## 2021-08-14 DIAGNOSIS — W19XXXA Unspecified fall, initial encounter: Secondary | ICD-10-CM

## 2021-08-14 DIAGNOSIS — S76111A Strain of right quadriceps muscle, fascia and tendon, initial encounter: Secondary | ICD-10-CM

## 2021-08-14 MED ORDER — HYDROCODONE-ACETAMINOPHEN 5-325 MG PO TABS: ORAL_TABLET | ORAL | 0 refills | Status: DC

## 2021-08-14 NOTE — Progress Notes (Signed)
Subjective:    Patient ID: Danny [name], male    DOB: 1965/10/22, 55 y.o.   MRN: [id number]  HPI He fell at his daughter's gender reveal party in late October (she is expecting twin boys) and hurt his right knee.  He had pain and swelling and giving way.  He went to the ER on 07-10-2021.  He had evaluation and X-rays.  He has continued to have right knee pain.  He cannot extend the leg at all.  He has swelling.  He falls when he stands, he must use a walker to get around.  He is not any better.  He has tried ice, rubs, Tylenol, Advil with no help.   Review of Systems  Constitutional:  Positive for activity change.  Musculoskeletal:  Positive for arthralgias, gait problem, joint swelling and myalgias.  Neurological:  Positive for headaches.  Psychiatric/Behavioral:  The patient is nervous/anxious.   All other systems reviewed and are negative. For Review of Systems, all other systems reviewed and are negative.  The following is a summary of the past history medically, past history surgically, known current medicines, social history and family history.  This information is gathered electronically by the computer from prior information and documentation.  I review this each visit and have found including this information at this point in the chart is beneficial and informative.   Past Medical History:  Diagnosis Date   Alcohol abuse    Anxiety    Back pain    CVA (cerebral infarction)    Depression    Diabetes mellitus    Hypertension    Migraines    Stroke [facility])     Past Surgical History:  Procedure Laterality Date   FOOT SURGERY     MANDIBLE SURGERY      Current Outpatient Medications on File Prior to Visit  Medication Sig Dispense Refill   albuterol (VENTOLIN HFA) 108 (90 Base) MCG/ACT inhaler Inhale 2 puffs into the lungs every 6 (six) hours as needed for wheezing or shortness of breath.     busPIRone (BUSPAR) 15 MG tablet Take 15 mg by mouth 2 (two) times daily.      Cholecalciferol (VITAMIN D-3) 125 MCG (5000 UT) TABS Take by mouth daily.     citalopram (CELEXA) 20 MG tablet Take 20 mg by mouth daily.     gabapentin (NEURONTIN) 600 MG tablet Take 600 mg by mouth in the morning and at bedtime.     hydrOXYzine (ATARAX) 25 MG tablet Take 25 mg by mouth 3 (three) times daily as needed.     meloxicam (MOBIC) 7.5 MG tablet Take 7.5 mg by mouth in the morning and at bedtime.     metFORMIN (GLUCOPHAGE) 500 MG tablet Take 1 tablet (500 mg total) by mouth 2 (two) times daily with a meal. 60 tablet 2   naproxen (NAPROSYN) 500 MG tablet Take 1 tablet (500 mg total) by mouth 2 (two) times daily. 30 tablet 0   omeprazole (PRILOSEC) 40 MG capsule Take 1 capsule (40 mg total) by mouth daily before breakfast. 30 capsule 3   QUEtiapine (SEROQUEL) 100 MG tablet Take 100 mg by mouth at bedtime.     No current facility-administered medications on file prior to visit.    Social History   Socioeconomic History   Marital status: Legally Separated    Spouse name: Not on file   Number of children: Not on file   Years of education: Not on file   Highest  education level: Not on file  Occupational History   Not on file  Tobacco Use   Smoking status: Every Day    Packs/day: 2.00    Years: 20.00    Pack years: 40.00    Types: Cigarettes   Smokeless tobacco: Never  Vaping Use   Vaping Use: Never used  Substance and Sexual Activity   Alcohol use: Not Currently    Comment: Quit 11 months ago. Used to drink about 6 pack a day. (documented 06/06/21)   Drug use: Yes    Types: Marijuana    Comment: occas   Sexual activity: Yes    Birth control/protection: None  Other Topics Concern   Not on file  Social History Narrative   Not on file   Social Determinants of Health   Financial Resource Strain: Not on file  Food Insecurity: Not on file  Transportation Needs: Not on file  Physical Activity: Not on file  Stress: Not on file  Social Connections: Not on file  Intimate  Partner Violence: Not on file    Family History  Problem Relation Age of Onset   Heart failure Mother    Stroke Father    Stroke Sister    Cancer Sister        breast cancer   Colon cancer Neg Hx    Liver cancer Neg Hx     BP (!) 174/93   Pulse (!) 109   Ht 5\' 9"  (1.753 m)   Wt 169 lb (76.7 kg)   BMI 24.96 kg/m   Body mass index is 24.96 kg/m.     Objective:   Physical Exam Vitals and nursing note reviewed. Exam conducted with a chaperone present.  Constitutional:      Appearance: He is well-developed.  HENT:     Head: Normocephalic and atraumatic.  Eyes:     Conjunctiva/sclera: Conjunctivae normal.     Pupils: Pupils are equal, round, and reactive to light.  Cardiovascular:     Rate and Rhythm: Normal rate and regular rhythm.  Pulmonary:     Effort: Pulmonary effort is normal.  Abdominal:     Palpations: Abdomen is soft.  Musculoskeletal:     Cervical back: Normal range of motion and neck supple.       Legs:  Skin:    General: Skin is warm and dry.  Neurological:     Mental Status: He is alert and oriented to person, place, and time.     Cranial Nerves: No cranial nerve deficit.     Motor: No abnormal muscle tone.     Coordination: Coordination normal.     Deep Tendon Reflexes: Reflexes are normal and symmetric. Reflexes normal.  Psychiatric:        Behavior: Behavior normal.        Thought Content: Thought content normal.        Judgment: Judgment normal.   I have independently reviewed and interpreted x-rays of this patient done at another site by another physician or qualified health professional. I have reviewed the ER notes as well.        Assessment & Plan:   Encounter Diagnosis  Name Primary?   Rupture of right quadriceps tendon, initial encounter Yes   I have explained my diagnosis to him.  He needs a MRI of the knee to see extent of tear and if he has meniscus problem as well.  We will try to get MRI very soon.  I will give pain  medicine.  I have reviewed the [employer] web site prior to prescribing narcotic medicine for this patient.  Return in one week.  Knee immobilizer given.    If torn, he will need surgery.  It has already been about 5 weeks from injury.  Call if any problem.  Precautions discussed.  Electronically Signed Sanjuana Kava, MD 12/6/20229:59 AM

## 2021-08-16 ENCOUNTER — Ambulatory Visit (HOSPITAL_COMMUNITY): Payer: Medicaid Other

## 2021-08-17 ENCOUNTER — Ambulatory Visit (HOSPITAL_COMMUNITY)
Admission: RE | Admit: 2021-08-17 | Discharge: 2021-08-17 | Disposition: A | Discharge status: Home / Self Care | Payer: Medicaid Other | Source: Ambulatory Visit | Attending: Orthopaedic Surgery | Admitting: Orthopaedic Surgery

## 2021-08-17 ENCOUNTER — Other Ambulatory Visit: Payer: Self-pay | Admitting: Nurse Practitioner

## 2021-08-17 DIAGNOSIS — S76111A Strain of right quadriceps muscle, fascia and tendon, initial encounter: Secondary | ICD-10-CM | POA: Insufficient documentation

## 2021-08-17 DIAGNOSIS — C220 Liver cell carcinoma: Secondary | ICD-10-CM

## 2021-08-21 ENCOUNTER — Ambulatory Visit: Payer: Medicaid Other | Admitting: Orthopaedic Surgery

## 2021-08-23 ENCOUNTER — Other Ambulatory Visit: Payer: Self-pay

## 2021-08-23 ENCOUNTER — Ambulatory Visit
Admission: RE | Admit: 2021-08-23 | Discharge: 2021-08-23 | Disposition: A | Discharge status: Home / Self Care | Payer: Medicaid Other | Source: Ambulatory Visit | Attending: Nurse Practitioner | Admitting: Nurse Practitioner

## 2021-08-23 ENCOUNTER — Telehealth: Payer: Self-pay | Admitting: Orthopedic Surgery

## 2021-08-23 ENCOUNTER — Encounter: Payer: Self-pay | Admitting: Orthopedic Surgery

## 2021-08-23 ENCOUNTER — Ambulatory Visit: Payer: Medicaid Other | Admitting: Orthopedic Surgery

## 2021-08-23 DIAGNOSIS — S76111A Strain of right quadriceps muscle, fascia and tendon, initial encounter: Secondary | ICD-10-CM

## 2021-08-23 DIAGNOSIS — C220 Liver cell carcinoma: Secondary | ICD-10-CM

## 2021-08-23 HISTORY — PX: IR RADIOLOGIST EVAL & MGMT: IMG5224

## 2021-08-23 MED ORDER — HYDROCODONE-ACETAMINOPHEN 7.5-325 MG PO TABS: 1.0000 | ORAL_TABLET | ORAL | 0 refills | Status: DC | PRN

## 2021-08-23 NOTE — Consult Note (Signed)
Chief Complaint: Patient was seen in consultation today for hepatocellular carcinoma via virtual telephone visit  Referring Physician(s): Roosevelt Locks, NP  History of Present Illness: Danny [name] is a 55 y.o. male with history of alcoholic cirrhosis with recent imaging diagnosis of multifocal hepatocellular carcinoma.  He has complained of right upper quadrant pain for approximately 1 year.  Multiple imaging studies eventually led to a CT which demonstrated enhancing lesions in the left lobe of the liver.  This prompted and MRI which demonstrates a 2.7 cm LIRADS 5 mass in segment II and LIRADS 3 lesion in the right dome.  There was a 1.6 cm mass on prior CTA in segment III which was inconspicuous on MR, but highly suspicious for additional HCC.    He is overall doing well besides persistent abdominal pain.  He complains mostly of his knee for which he is undergoing surgical repair later this month for a torn quadriceps tendon.  No fevers, chills, shortness of breath, chest pain, nausea/vomiting, scleral icterus or jaundice.       Past Medical History:  Diagnosis Date   Alcohol abuse    Anxiety    Back pain    CVA (cerebral infarction)    Depression    Diabetes mellitus    Hypertension    Migraines    Stroke [facility])     Past Surgical History:  Procedure Laterality Date   FOOT SURGERY     MANDIBLE SURGERY      Allergies: Ultram [tramadol] and Benadryl [diphenhydramine]  Medications: Prior to Admission medications   Medication Sig Start Date End Date Taking? Authorizing Provider  albuterol (VENTOLIN HFA) 108 (90 Base) MCG/ACT inhaler Inhale 2 puffs into the lungs every 6 (six) hours as needed for wheezing or shortness of breath.    [provider]  busPIRone (BUSPAR) 15 MG tablet Take 15 mg by mouth 2 (two) times daily.    [provider]  Cholecalciferol (VITAMIN D-3) 125 MCG (5000 UT) TABS Take by mouth daily. Patient not taking: Reported on  08/23/2021    [provider]  citalopram (CELEXA) 20 MG tablet Take 20 mg by mouth daily. Patient not taking: Reported on 08/23/2021    [provider]  gabapentin (NEURONTIN) 600 MG tablet Take 600 mg by mouth in the morning and at bedtime. 06/24/21   [provider]  HYDROcodone-acetaminophen (NORCO) 7.5-325 MG tablet Take 1 tablet by mouth every 4 (four) hours as needed for up to 5 days for moderate pain. 08/23/21 08/28/21  Carole Civil, MD  hydrOXYzine (ATARAX) 25 MG tablet Take 25 mg by mouth 3 (three) times daily as needed.    [provider]  meloxicam (MOBIC) 7.5 MG tablet Take 7.5 mg by mouth in the morning and at bedtime.    [provider]  metFORMIN (GLUCOPHAGE) 500 MG tablet Take 1 tablet (500 mg total) by mouth 2 (two) times daily with a meal. 01/01/18   Julianne Rice, MD  omeprazole (PRILOSEC) 40 MG capsule Take 1 capsule (40 mg total) by mouth daily before breakfast. 06/06/21   Erenest Rasher, PA-C  QUEtiapine (SEROQUEL) 100 MG tablet Take 100 mg by mouth at bedtime.    [provider]     Family History  Problem Relation Age of Onset   Heart failure Mother    Stroke Father    Stroke Sister    Cancer Sister        breast cancer   Colon cancer Neg  Hx    Liver cancer Neg Hx     Social History   Socioeconomic History   Marital status: Legally Separated    Spouse name: Not on file   Number of children: Not on file   Years of education: Not on file   Highest education level: Not on file  Occupational History   Not on file  Tobacco Use   Smoking status: Every Day    Packs/day: 2.00    Years: 20.00    Pack years: 40.00    Types: Cigarettes   Smokeless tobacco: Never  Vaping Use   Vaping Use: Never used  Substance and Sexual Activity   Alcohol use: Not Currently    Comment: Quit 11 months ago. Used to drink about 6 pack a day. (documented 06/06/21)   Drug use: Yes    Types: Marijuana    Comment:  occas   Sexual activity: Yes    Birth control/protection: None  Other Topics Concern   Not on file  Social History Narrative   Not on file   Social Determinants of Health   Financial Resource Strain: Not on file  Food Insecurity: Not on file  Transportation Needs: Not on file  Physical Activity: Not on file  Stress: Not on file  Social Connections: Not on file    ECOG Status: 2 - Symptomatic, <50% confined to bed  Review of Systems: A 12 point ROS discussed and pertinent positives are indicated in the HPI above.  All other systems are negative.  Vital Signs: There were no vitals taken for this visit.  No physical examination performed in lieu of telephone visit.  Imaging: CT AP 06/13/21, MR 07/09/21   Seg II, 2.5 cm, LR5   Seg III, 1.6 cm (not seen on MR)    Right dome, LR3    Labs:  CBC: Recent Labs    06/11/21 0907 07/10/21 1947  WBC 5.7 7.4  HGB 15.9 14.6  HCT 47.7 43.1  PLT 112* 151    COAGS: Recent Labs    06/11/21 0907  INR 1.1    BMP: Recent Labs    06/11/21 0907 07/10/21 1947  NA 134* 132*  K 4.0 3.7  CL 101 99  CO2 22 26  GLUCOSE 398* 140*  BUN 10 18  CALCIUM 9.2 8.7*  CREATININE 0.83 0.80  GFRNONAA  --  >60    LIVER FUNCTION TESTS: Recent Labs    06/11/21 0907  BILITOT 0.8  AST 61*  ALT 71*  PROT 7.1    TUMOR MARKERS: Recent Labs    06/11/21 0907  AFPTM 12.4*   No recent albumin, unable to calculate Child Pugh and ALBI scores.   Assessment and Plan: 55 year old male with recently diagnosed alcoholic cirrhosis and multifocal, left lobe dominant hepatocellular carcinoma.    We discussed various treatment approaches to hepatocellular carcinoma including chemotherapy and surgical, as well as locoregional options performed in IR including ablation and various embolization techniques.  Given the locations of his left-sided masses, he would be a best candidate for radioembolization, specifically attempted  segmentectomy with delivery of spheres directly to the two masses, or to the left lobe segments pending planning hepatic angiogram findings.  I suspect we will identify the lesion in segment III which was inconspicuous on MR.    The basic procedural steps and periprocedural care were discussed with Danny [name] and all questions were answered.  He is amenable to proceed.  Plan for hepatic angiogram with Tc-MAA  administration at [facility] followed by radioembolization approximately 2 weeks later.  At this time we plan to only treat the left lobe and observe the LR3 findings in the right.    Thank you for this interesting consult.  I greatly enjoyed meeting Danny [name] and look forward to participating in their care.  A copy of this report was sent to the requesting provider on this date.  Electronically Signed: Suzette Battiest, MD 08/23/2021, 2:07 PM   I spent a total of  60 Minutes  in telephone clinical consultation, greater than 50% of which was counseling/coordinating care for hepatocellular carcinoma.

## 2021-08-23 NOTE — Patient Instructions (Signed)
Your surgery will be at [facility] by Dr Aline Brochure  The hospital will contact you with a preoperative appointment to discuss Anesthesia. The phone number is [phone]  Please bring your medications with you for the appointment. They will tell you the arrival time and medication instructions when you have your preoperative evaluation. Do not wear nail polish the day of your surgery and if you take Phentermine you need to stop this medication ONE WEEK prior to your surgery.

## 2021-08-23 NOTE — Telephone Encounter (Signed)
Patient called regarding medication prescribed today by Dr Aline Brochure: HYDROcodone-acetaminophen ([place]) 7.5-325 MG tablet 30 tablet         - said [facility] does not have it; asking about transferring it to another pharmacy; aware that Dr Aline Brochure is out of clinic at this time. Please advise.

## 2021-08-23 NOTE — Telephone Encounter (Signed)
Called [facility] to figure out why pt was unable to get his rx, pharmacy states medication is on back order and has been for awhile. Let pt know why they were unable to fill medication and let him know that I will send to Dr. Aline Brochure for a possible transfer. Pt requested medication go to [facility] in [place]. Medication pended and sent to provider.

## 2021-08-23 NOTE — Progress Notes (Signed)
Chief Complaint  Patient presents with   Results    MRI    Mr. Danny [name] is a 55 year old male with diabetes who also is a smoker who presents to Dr. Luna Glasgow on December 6 with pain in his right knee and inability to extend it secondary to an injury in late October.  He went to the ER November 1 he had x-rays and Dr. Luna Glasgow saw him as stated on the sixth sent him for MRI he came back today with the MRI showing superficial fibers of the quadriceps tendon torn and persistent pain and inability to extend the knee  My brief exam today included evaluation of his right knee he cannot extend the knee at all he has a defect at the patella  He also has medial joint line tenderness is which is where he pointed to when I asked him where his knee hurt  His MRI is reviewed and it does indeed show that he has the torn quadriceps tendon primarily the superficial fibers  When I look at his MRI based on what was read it seems like he has a lot of degeneration in the quadriceps and patellar tendon which is the tendinosis the clinical exam suggests more of a defect at the quadriceps insertion  Based on his lack of knee extension have recommended that he have surgery.  I let him know I am not sure what were going to find or how were going to repair it is based on the surgical findings  He is in agreement  He will come in for preop visit on the 21st anticipating surgery on 23 December  I did not have relayed to him that he has 2 risk factors for delayed course which is the smoking and the diabetes  He is also asking for pain medication already on hydrocodone 5 mg so this is of some concern as well.  Meds ordered this encounter  Medications   HYDROcodone-acetaminophen (NORCO) 7.5-325 MG tablet    Sig: Take 1 tablet by mouth every 4 (four) hours as needed for up to 5 days for moderate pain.    Dispense:  30 tablet    Refill:  0

## 2021-08-24 ENCOUNTER — Other Ambulatory Visit: Payer: Self-pay | Admitting: Orthopedic Surgery

## 2021-08-24 MED ORDER — HYDROCODONE-ACETAMINOPHEN 7.5-325 MG PO TABS: 1.0000 | ORAL_TABLET | ORAL | 0 refills | Status: AC | PRN

## 2021-08-24 NOTE — Telephone Encounter (Signed)
Rx sent in at 949am Friday 08/24/21

## 2021-08-24 NOTE — Telephone Encounter (Signed)
Check pharmacy at 11 am

## 2021-08-24 NOTE — Telephone Encounter (Signed)
Patient called back and wants to know about his pain medicine.  He needs it and wants to know if the nurse can do anything about it.   I advised the patient with it being a narcotic the doctor has to review and send it to another pharmacy.

## 2021-08-24 NOTE — Progress Notes (Signed)
Meds ordered this encounter  Medications   HYDROcodone-acetaminophen (NORCO) 7.5-325 MG tablet    Sig: Take 1 tablet by mouth every 4 (four) hours as needed for up to 5 days for moderate pain.    Dispense:  30 tablet    Refill:  0

## 2021-08-24 NOTE — Telephone Encounter (Signed)
Called patient to notify

## 2021-08-29 ENCOUNTER — Other Ambulatory Visit: Payer: Self-pay

## 2021-08-29 ENCOUNTER — Encounter: Payer: Self-pay | Admitting: Orthopedic Surgery

## 2021-08-29 ENCOUNTER — Ambulatory Visit (INDEPENDENT_AMBULATORY_CARE_PROVIDER_SITE_OTHER): Payer: Medicaid Other | Admitting: Orthopedic Surgery

## 2021-08-29 VITALS — BP 137/70 | HR 98 | Ht 71.0 in | Wt 168.0 lb

## 2021-08-29 DIAGNOSIS — S76111D Strain of right quadriceps muscle, fascia and tendon, subsequent encounter: Secondary | ICD-10-CM | POA: Diagnosis not present

## 2021-08-29 NOTE — Progress Notes (Addendum)
Danny [name]  08/29/2021  Body mass index is 23.43 kg/m.  ASSESSMENT AND PLAN:     Encounter Diagnosis  Name Primary?   Rupture of right quadriceps tendon, subsequent encounter Yes      The procedure has been fully reviewed with the patient; The risks and benefits of surgery have been discussed and explained and understood. Alternative treatment has also been reviewed, questions were encouraged and answered. The postoperative plan is also been reviewed. Risk factors include smoking history and diabetes  Quadriceps tendon repair right leg  6 weeks in a straight brace  6 weeks in a hinged brace  Expect 1 year recovery Chief Complaint  Patient presents with   Follow-up    Recheck for preop for right quadricep tendon   55 year old male followed by Dr. Luna Glasgow after getting up at a party and injuring his right knee he lost ability to extended or weight-bear came to the ER water 2 days later with a ruptured quadriceps tendon which was confirmed by MRI  He presents now for surgical management     HISTORY SECTION :   ROS  Denies chest pain or shortness of breath     has a past medical history of Alcohol abuse, Anxiety, Back pain, CVA (cerebral infarction), Depression, Diabetes mellitus, Hypertension, Migraines, and Stroke ([place]).   Past Surgical History:  Procedure Laterality Date   FOOT SURGERY     IR RADIOLOGIST EVAL & MGMT  08/23/2021   MANDIBLE SURGERY      Social History   Socioeconomic History   Marital status: Legally Separated    Spouse name: Not on file   Number of children: Not on file   Years of education: Not on file   Highest education level: Not on file  Occupational History   Not on file  Tobacco Use   Smoking status: Every Day    Packs/day: 2.00    Years: 20.00    Pack years: 40.00    Types: Cigarettes   Smokeless tobacco: Never  Vaping Use   Vaping Use: Never used  Substance and Sexual Activity   Alcohol use: Not Currently     Comment: Quit 11 months ago. Used to drink about 6 pack a day. (documented 06/06/21)   Drug use: Yes    Types: Marijuana    Comment: occas   Sexual activity: Yes    Birth control/protection: None  Other Topics Concern   Not on file  Social History Narrative   Not on file   Social Determinants of Health   Financial Resource Strain: Not on file  Food Insecurity: Not on file  Transportation Needs: Not on file  Physical Activity: Not on file  Stress: Not on file  Social Connections: Not on file  Intimate Partner Violence: Not on file     Family History  Problem Relation Age of Onset   Heart failure Mother    Stroke Father    Stroke Sister    Cancer Sister        breast cancer   Colon cancer Neg Hx    Liver cancer Neg Hx       Allergies  Allergen Reactions   Ultram [Tramadol] Hives   Benadryl [Diphenhydramine] Itching and Rash     Current Outpatient Medications:    albuterol (VENTOLIN HFA) 108 (90 Base) MCG/ACT inhaler, Inhale 2 puffs into the lungs every 6 (six) hours as needed for wheezing or shortness of breath., Disp: , Rfl:    busPIRone (BUSPAR) 15  MG tablet, Take 15 mg by mouth 2 (two) times daily., Disp: , Rfl:    Cholecalciferol (VITAMIN D-3) 125 MCG (5000 UT) TABS, Take by mouth daily., Disp: , Rfl:    citalopram (CELEXA) 20 MG tablet, Take 20 mg by mouth daily., Disp: , Rfl:    gabapentin (NEURONTIN) 600 MG tablet, Take 600 mg by mouth in the morning and at bedtime., Disp: , Rfl:    HYDROcodone-acetaminophen (NORCO) 7.5-325 MG tablet, Take 1 tablet by mouth every 4 (four) hours as needed for up to 5 days for moderate pain., Disp: 30 tablet, Rfl: 0   hydrOXYzine (ATARAX) 25 MG tablet, Take 25 mg by mouth 3 (three) times daily as needed., Disp: , Rfl:    meloxicam (MOBIC) 7.5 MG tablet, Take 7.5 mg by mouth in the morning and at bedtime., Disp: , Rfl:    metFORMIN (GLUCOPHAGE) 500 MG tablet, Take 1 tablet (500 mg total) by mouth 2 (two) times daily with a meal.,  Disp: 60 tablet, Rfl: 2   omeprazole (PRILOSEC) 40 MG capsule, Take 1 capsule (40 mg total) by mouth daily before breakfast., Disp: 30 capsule, Rfl: 3   QUEtiapine (SEROQUEL) 100 MG tablet, Take 100 mg by mouth at bedtime., Disp: , Rfl:    PHYSICAL EXAM SECTION: BP 137/70    Pulse 98    Ht 5\' 11"  (1.803 m)    Wt 168 lb (76.2 kg)    BMI 23.43 kg/m   Body mass index is 23.43 kg/m.   General appearance: Well-developed well-nourished no gross deformities  Eyes clear normal vision no evidence of conjunctivitis or jaundice, extraocular muscles intact  ENT: ears hearing normal, nasal passages clear, throat clear   Neck is supple without palpable mass, full range of motion   Cardiovascular normal pulse and perfusion in all 4 extremities normal color without edema  Lymph nodes: No lymphadenopathy  Neurologically deep tendon reflexes are equal and normal, no sensation loss or deficits no pathologic reflexes   Skin no lacerations or ulcerations no nodularity no palpable masses, no erythema or nodularity  Psychological: Awake alert and oriented x3 mood and affect normal  Musculoskeletal: The patient has no active extension he has a palpable defect superior patella the patella tendon is intact  Imaging: I personally read the images and my interpretation is MRI shows superficial rupture of the quadriceps tendon  12:08 PM  Arther Abbott

## 2021-08-29 NOTE — Patient Instructions (Signed)
Danny [name]  08/29/2021     @PREFPERIOPPHARMACY @   Your procedure is scheduled on  09/04/2021.   Report to [facility] at  1200  P.M.   Call this number if you have problems the morning of surgery:  [phone]   Remember:  Do not eat or drink after midnight.      Use your inhaler before you come and bring your rescue inhaler with you.    Take these medicines the morning of surgery with A SIP OF WATER       buspar, celexa, gabapentin, hydrocodone(if needed), mobic(If needed), omeprazole.    Do not wear jewelry, make-up or nail polish.  Do not wear lotions, powders, or perfumes, or deodorant.  Do not shave 48 hours prior to surgery.  Men may shave face and neck.  Do not bring valuables to the hospital.  [facility] is not responsible for any belongings or valuables.  Contacts, dentures or bridgework may not be worn into surgery.  Leave your suitcase in the car.  After surgery it may be brought to your room.  For patients admitted to the hospital, discharge time will be determined by your treatment team.  Patients discharged the day of surgery will not be allowed to drive home and must have someone with them for 24 hours.    Special instructions:   DO NOT smoke tobacco or vape for 24 hours before your procedure.  Please read over the following fact sheets that you were given. Coughing and Deep Breathing, Surgical Site Infection Prevention, Anesthesia Post-op Instructions, and Care and Recovery After Surgery      Tendon Repair, Care After This sheet gives you information about how to care for yourself after your procedure. Your health care provider may also give you more specific instructions. If you have problems or questions, contact your health care provider. What can I expect after the procedure? After the procedure, it is common to have: Soreness or pain. Stiffness. Limited range of motion in the joint where the tendon is repaired. Follow these  instructions at home: If you have a splint or brace: Wear the splint or brace as told by your health care provider. Remove it only as told by your health care provider. Loosen the splint or brace if your fingers or toes tingle, become numb, or turn cold and blue. Keep the splint or brace clean and dry. If you have a cast: Do not put pressure on any part of the cast until it is fully hardened. Do not stick anything inside the cast to scratch your skin. Doing that increases your risk of infection. Check the skin around the cast every day. Tell your health care provider about any concerns. You may put lotion on dry skin around the edges of the cast. Do not put lotion on the skin underneath the cast. Keep the cast clean and dry. Bathing Do not take baths, swim, or use a hot tub until your health care provider approves. Ask your health care provider if you may take showers. You may only be allowed to take sponge baths. If your splint, brace, or cast is not waterproof: Do not let it get wet. Cover it with a watertight covering when you take a bath or a shower. Keep the bandage (dressing) dry until your health care provider says it can be removed. Medicines Take over-the-counter and prescription medicines only as told by your health care provider. Do not drive or  use heavy machinery while taking prescription pain medicine. Incision care  Follow instructions from your health care provider about how to take care of your incision. Make sure you: Wash your hands with soap and water before you change your dressing. If soap and water are not available, use hand sanitizer. Change your dressing as told by your health care provider. Leave stitches (sutures), skin glue, or adhesive strips in place. These skin closures may need to stay in place for 2 weeks or longer. If adhesive strip edges start to loosen and curl up, you may trim the loose edges. Do not remove adhesive strips completely unless your health  care provider tells you to do that. Check your incision area every day for signs of infection. Check for: Redness, swelling, or pain. Fluid or blood. Warmth. Pus or a bad smell. Managing pain, stiffness, and swelling  If directed, put ice on the affected area: If you have a removable splint or brace, remove it as told by your health care provider. Put ice in a plastic bag. Place a towel between your skin and the bag, or between your cast and the bag. Leave the ice on for 20 minutes, 2-3 times a day. Move your fingers or toes often to avoid stiffness and to lessen swelling. Raise (elevate) the injured area above the level of your heart while you are sitting or lying down. Activity Rest as told by your health care provider. Ask your health care provider what activities are safe for you during recovery, and ask what activities you need to avoid. Do not lift anything that is heavier than 10 lb (4.5 kg), or the limit that you are told, until your health care provider says that it is safe. Do not use the injured limb to support your body weight until your health care provider says that you can. If physical therapy was prescribed, do exercises as directed. Doing exercises may help to improve movement and flexibility (range of motion). General instructions Do not use any products that contain nicotine or tobacco, such as cigarettes and e-cigarettes. These can delay tendon healing. If you need help quitting, ask your health care provider. Ask your health care provider when it is safe to drive if you have a splint, brace, sling, or cast on your arm or leg. If you are taking prescription pain medicine, take actions to prevent or treat constipation. Your health care provider may recommend that you: Drink enough fluid to keep your urine pale yellow. Eat foods that are high in fiber, such as fresh fruits and vegetables, whole grains, and beans. Limit foods that are high in fat and processed sugars, such  as fried or sweet foods. Take an over-the-counter or prescription medicine for constipation. Keep all follow-up visits as told by your health care provider. This is important. Contact a health care provider if: You have redness, swelling, or pain at your incision site. You have fluid or blood coming from your incision site. Your incision feels warm to the touch. You have pus coming from your incision site. You have a bad smell coming from: The incision site or dressing. Underneath your cast or splint. You have a fever. Your stiffness or movement is not improving. Get help right away if: You have trouble breathing. You have chest pain. Your heart beats more quickly than normal, or you feel it skipping beats. You have severe pain. Summary After the procedure, it is common to have pain, stiffness, and limited range of motion. If your splint,  brace, or cast is not waterproof, do not let it get wet. Contact your health care provider if you have blood, fluid, or pus coming from your incision site. This information is not intended to replace advice given to you by your health care provider. Make sure you discuss any questions you have with your health care provider. Document Revised: 05/16/2020 Document Reviewed: 05/19/2020 Elsevier Patient Education  2022 [place] Anesthesia, Adult, Care After This sheet gives you information about how to care for yourself after your procedure. Your health care provider may also give you more specific instructions. If you have problems or questions, contact your health care provider. What can I expect after the procedure? After the procedure, the following side effects are common: Pain or discomfort at the IV site. Nausea. Vomiting. Sore throat. Trouble concentrating. Feeling cold or chills. Feeling weak or tired. Sleepiness and fatigue. Soreness and body aches. These side effects can affect parts of the body that were not involved in  surgery. Follow these instructions at home: For the time period you were told by your health care provider:  Rest. Do not participate in activities where you could fall or become injured. Do not drive or use machinery. Do not drink alcohol. Do not take sleeping pills or medicines that cause drowsiness. Do not make important decisions or sign legal documents. Do not take care of children on your own. Eating and drinking Follow any instructions from your health care provider about eating or drinking restrictions. When you feel hungry, start by eating small amounts of foods that are soft and easy to digest (bland), such as toast. Gradually return to your regular diet. Drink enough fluid to keep your urine pale yellow. If you vomit, rehydrate by drinking water, juice, or clear broth. General instructions If you have sleep apnea, surgery and certain medicines can increase your risk for breathing problems. Follow instructions from your health care provider about wearing your sleep device: Anytime you are sleeping, including during daytime naps. While taking prescription pain medicines, sleeping medicines, or medicines that make you drowsy. Have a responsible adult stay with you for the time you are told. It is important to have someone help care for you until you are awake and alert. Return to your normal activities as told by your health care provider. Ask your health care provider what activities are safe for you. Take over-the-counter and prescription medicines only as told by your health care provider. If you smoke, do not smoke without supervision. Keep all follow-up visits as told by your health care provider. This is important. Contact a health care provider if: You have nausea or vomiting that does not get better with medicine. You cannot eat or drink without vomiting. You have pain that does not get better with medicine. You are unable to pass urine. You develop a skin rash. You  have a fever. You have redness around your IV site that gets worse. Get help right away if: You have difficulty breathing. You have chest pain. You have blood in your urine or stool, or you vomit blood. Summary After the procedure, it is common to have a sore throat or nausea. It is also common to feel tired. Have a responsible adult stay with you for the time you are told. It is important to have someone help care for you until you are awake and alert. When you feel hungry, start by eating small amounts of foods that are soft and easy to digest (bland), such as  toast. Gradually return to your regular diet. Drink enough fluid to keep your urine pale yellow. Return to your normal activities as told by your health care provider. Ask your health care provider what activities are safe for you. This information is not intended to replace advice given to you by your health care provider. Make sure you discuss any questions you have with your health care provider. Document Revised: 05/11/2020 Document Reviewed: 12/09/2019 Elsevier Patient Education  2022 [place]. How to Use Chlorhexidine for Bathing Chlorhexidine gluconate (CHG) is a germ-killing (antiseptic) solution that is used to clean the skin. It can get rid of the bacteria that normally live on the skin and can keep them away for about 24 hours. To clean your skin with CHG, you may be given: A CHG solution to use in the shower or as part of a sponge bath. A prepackaged cloth that contains CHG. Cleaning your skin with CHG may help lower the risk for infection: While you are staying in the intensive care unit of the hospital. If you have a vascular access, such as a central line, to provide short-term or long-term access to your veins. If you have a catheter to drain urine from your bladder. If you are on a ventilator. A ventilator is a machine that helps you breathe by moving air in and out of your lungs. After surgery. What are the  risks? Risks of using CHG include: A skin reaction. Hearing loss, if CHG gets in your ears and you have a perforated eardrum. Eye injury, if CHG gets in your eyes and is not rinsed out. The CHG product catching fire. Make sure that you avoid smoking and flames after applying CHG to your skin. Do not use CHG: If you have a chlorhexidine allergy or have previously reacted to chlorhexidine. On babies younger than 63 months of age. How to use CHG solution Use CHG only as told by your health care provider, and follow the instructions on the label. Use the full amount of CHG as directed. Usually, this is one bottle. During a shower Follow these steps when using CHG solution during a shower (unless your health care provider gives you different instructions): Start the shower. Use your normal soap and shampoo to wash your face and hair. Turn off the shower or move out of the shower stream. Pour the CHG onto a clean washcloth. Do not use any type of brush or rough-edged sponge. Starting at your neck, lather your body down to your toes. Make sure you follow these instructions: If you will be having surgery, pay special attention to the part of your body where you will be having surgery. Scrub this area for at least 1 minute. Do not use CHG on your head or face. If the solution gets into your ears or eyes, rinse them well with water. Avoid your genital area. Avoid any areas of skin that have broken skin, cuts, or scrapes. Scrub your back and under your arms. Make sure to wash skin folds. Let the lather sit on your skin for 1-2 minutes or as long as told by your health care provider. Thoroughly rinse your entire body in the shower. Make sure that all body creases and crevices are rinsed well. Dry off with a clean towel. Do not put any substances on your body afterward--such as powder, lotion, or perfume--unless you are told to do so by your health care provider. Only use lotions that are recommended by  the manufacturer. Put on clean  clothes or pajamas. If it is the night before your surgery, sleep in clean sheets.  During a sponge bath Follow these steps when using CHG solution during a sponge bath (unless your health care provider gives you different instructions): Use your normal soap and shampoo to wash your face and hair. Pour the CHG onto a clean washcloth. Starting at your neck, lather your body down to your toes. Make sure you follow these instructions: If you will be having surgery, pay special attention to the part of your body where you will be having surgery. Scrub this area for at least 1 minute. Do not use CHG on your head or face. If the solution gets into your ears or eyes, rinse them well with water. Avoid your genital area. Avoid any areas of skin that have broken skin, cuts, or scrapes. Scrub your back and under your arms. Make sure to wash skin folds. Let the lather sit on your skin for 1-2 minutes or as long as told by your health care provider. Using a different clean, wet washcloth, thoroughly rinse your entire body. Make sure that all body creases and crevices are rinsed well. Dry off with a clean towel. Do not put any substances on your body afterward--such as powder, lotion, or perfume--unless you are told to do so by your health care provider. Only use lotions that are recommended by the manufacturer. Put on clean clothes or pajamas. If it is the night before your surgery, sleep in clean sheets. How to use CHG prepackaged cloths Only use CHG cloths as told by your health care provider, and follow the instructions on the label. Use the CHG cloth on clean, dry skin. Do not use the CHG cloth on your head or face unless your health care provider tells you to. When washing with the CHG cloth: Avoid your genital area. Avoid any areas of skin that have broken skin, cuts, or scrapes. Before surgery Follow these steps when using a CHG cloth to clean before surgery  (unless your health care provider gives you different instructions): Using the CHG cloth, vigorously scrub the part of your body where you will be having surgery. Scrub using a back-and-forth motion for 3 minutes. The area on your body should be completely wet with CHG when you are done scrubbing. Do not rinse. Discard the cloth and let the area air-dry. Do not put any substances on the area afterward, such as powder, lotion, or perfume. Put on clean clothes or pajamas. If it is the night before your surgery, sleep in clean sheets.  For general bathing Follow these steps when using CHG cloths for general bathing (unless your health care provider gives you different instructions). Use a separate CHG cloth for each area of your body. Make sure you wash between any folds of skin and between your fingers and toes. Wash your body in the following order, switching to a new cloth after each step: The front of your neck, shoulders, and chest. Both of your arms, under your arms, and your hands. Your stomach and groin area, avoiding the genitals. Your right leg and foot. Your left leg and foot. The back of your neck, your back, and your buttocks. Do not rinse. Discard the cloth and let the area air-dry. Do not put any substances on your body afterward--such as powder, lotion, or perfume--unless you are told to do so by your health care provider. Only use lotions that are recommended by the manufacturer. Put on clean clothes or pajamas.  Contact a health care provider if: Your skin gets irritated after scrubbing. You have questions about using your solution or cloth. You swallow any chlorhexidine. Call your local poison control center (1-712-218-2612 in the [place] Get help right away if: Your eyes itch badly, or they become very red or swollen. Your skin itches badly and is red or swollen. Your hearing changes. You have trouble seeing. You have swelling or tingling in your mouth or throat. You have  trouble breathing. These symptoms may represent a serious problem that is an emergency. Do not wait to see if the symptoms will go away. Get medical help right away. Call your local emergency services (911 in the [place] Do not drive yourself to the hospital. Summary Chlorhexidine gluconate (CHG) is a germ-killing (antiseptic) solution that is used to clean the skin. Cleaning your skin with CHG may help to lower your risk for infection. You may be given CHG to use for bathing. It may be in a bottle or in a prepackaged cloth to use on your skin. Carefully follow your health care provider's instructions and the instructions on the product label. Do not use CHG if you have a chlorhexidine allergy. Contact your health care provider if your skin gets irritated after scrubbing. This information is not intended to replace advice given to you by your health care provider. Make sure you discuss any questions you have with your health care provider. Document Revised: 11/06/2020 Document Reviewed: 11/06/2020 Elsevier Patient Education  2022 [employer].

## 2021-08-30 ENCOUNTER — Telehealth: Payer: Self-pay | Admitting: Radiology

## 2021-08-30 DIAGNOSIS — S76111D Strain of right quadriceps muscle, fascia and tendon, subsequent encounter: Secondary | ICD-10-CM

## 2021-08-30 NOTE — Telephone Encounter (Signed)
-----   Message from Uvaldo Bristle sent at 08/30/2021  4:10 PM EST ----- Regarding: Call from [place] Call from [place] at [facility] [phone]

## 2021-08-31 ENCOUNTER — Encounter (HOSPITAL_COMMUNITY)
Admission: RE | Admit: 2021-08-31 | Discharge: 2021-08-31 | Disposition: A | Discharge status: Home / Self Care | Payer: Medicaid Other | Source: Ambulatory Visit | Attending: Orthopedic Surgery | Admitting: Orthopedic Surgery

## 2021-08-31 ENCOUNTER — Encounter (HOSPITAL_COMMUNITY): Payer: Self-pay

## 2021-09-03 NOTE — H&P (Signed)
Danny [name]   08/29/2021   Body mass index is 23.43 kg/m.   ASSESSMENT AND PLAN:          Encounter Diagnosis  Name Primary?   Rupture of right quadriceps tendon, subsequent encounter Yes         The procedure has been fully reviewed with the patient; The risks and benefits of surgery have been discussed and explained and understood. Alternative treatment has also been reviewed, questions were encouraged and answered. The postoperative plan is also been reviewed. Risk factors include smoking history and diabetes   Quadriceps tendon repair right leg   6 weeks in a straight brace   6 weeks in a hinged brace   Expect 1 year recovery     Chief Complaint  Patient presents with   Follow-up      Recheck for preop for right quadricep tendon    55 year old male followed by Dr. Luna Glasgow after getting up at a party and injuring his right knee he lost ability to extended or weight-bear came to the ER water 2 days later with a ruptured quadriceps tendon which was confirmed by MRI   He presents now for surgical management    ROS   Denies chest pain or shortness of breath, THE OTHER SYSTEMS WERE CLEAN         has a past medical history of Alcohol abuse, Anxiety, Back pain, CVA (cerebral infarction), Depression, Diabetes mellitus, Hypertension, Migraines, and Stroke ([place]).         Past Surgical History:  Procedure Laterality Date   FOOT SURGERY       IR RADIOLOGIST EVAL & MGMT   08/23/2021   MANDIBLE SURGERY          Social History         Socioeconomic History   Marital status: Legally Separated      Spouse name: Not on file   Number of children: Not on file   Years of education: Not on file   Highest education level: Not on file  Occupational History   Not on file  Tobacco Use   Smoking status: Every Day      Packs/day: 2.00      Years: 20.00      Pack years: 40.00      Types: Cigarettes   Smokeless tobacco: Never  Vaping Use   Vaping Use: Never used   Substance and Sexual Activity   Alcohol use: Not Currently      Comment: Quit 11 months ago. Used to drink about 6 pack a day. (documented 06/06/21)   Drug use: Yes      Types: Marijuana      Comment: occas   Sexual activity: Yes      Birth control/protection: None  Other Topics Concern   Not on file  Social History Narrative   Not on file    Social Determinants of Health    Financial Resource Strain: Not on file  Food Insecurity: Not on file  Transportation Needs: Not on file  Physical Activity: Not on file  Stress: Not on file  Social Connections: Not on file  Intimate Partner Violence: Not on file             Family History  Problem Relation Age of Onset   Heart failure Mother     Stroke Father     Stroke Sister     Cancer Sister          breast cancer  Colon cancer Neg Hx     Liver cancer Neg Hx                Allergies  Allergen Reactions   Ultram [Tramadol] Hives   Benadryl [Diphenhydramine] Itching and Rash        Current Outpatient Medications:    albuterol (VENTOLIN HFA) 108 (90 Base) MCG/ACT inhaler, Inhale 2 puffs into the lungs every 6 (six) hours as needed for wheezing or shortness of breath., Disp: , Rfl:    busPIRone (BUSPAR) 15 MG tablet, Take 15 mg by mouth 2 (two) times daily., Disp: , Rfl:    Cholecalciferol (VITAMIN D-3) 125 MCG (5000 UT) TABS, Take by mouth daily., Disp: , Rfl:    citalopram (CELEXA) 20 MG tablet, Take 20 mg by mouth daily., Disp: , Rfl:    gabapentin (NEURONTIN) 600 MG tablet, Take 600 mg by mouth in the morning and at bedtime., Disp: , Rfl:    HYDROcodone-acetaminophen (NORCO) 7.5-325 MG tablet, Take 1 tablet by mouth every 4 (four) hours as needed for up to 5 days for moderate pain., Disp: 30 tablet, Rfl: 0   hydrOXYzine (ATARAX) 25 MG tablet, Take 25 mg by mouth 3 (three) times daily as needed., Disp: , Rfl:    meloxicam (MOBIC) 7.5 MG tablet, Take 7.5 mg by mouth in the morning and at bedtime., Disp: , Rfl:     metFORMIN (GLUCOPHAGE) 500 MG tablet, Take 1 tablet (500 mg total) by mouth 2 (two) times daily with a meal., Disp: 60 tablet, Rfl: 2   omeprazole (PRILOSEC) 40 MG capsule, Take 1 capsule (40 mg total) by mouth daily before breakfast., Disp: 30 capsule, Rfl: 3   QUEtiapine (SEROQUEL) 100 MG tablet, Take 100 mg by mouth at bedtime., Disp: , Rfl:      PHYSICAL EXAM SECTION: BP 137/70    Pulse 98    Ht 5\' 11"  (1.803 m)    Wt 168 lb (76.2 kg)    BMI 23.43 kg/m   Body mass index is 23.43 kg/m.     General appearance: Well-developed well-nourished no gross deformities   Eyes clear normal vision no evidence of conjunctivitis or jaundice, extraocular muscles intact   ENT: ears hearing normal, nasal passages clear, throat clear    Neck is supple without palpable mass, full range of motion    Cardiovascular normal pulse and perfusion in all 4 extremities normal color without edema   Lymph nodes: No lymphadenopathy  Neurologically deep tendon reflexes are equal and normal, no sensation loss or deficits no pathologic reflexes     Skin no lacerations or ulcerations no nodularity no palpable masses, no erythema or nodularity   Psychological: Awake alert and oriented x3 mood and affect normal   Musculoskeletal: The patient has no active extension he has a palpable defect superior patella the patella tendon is intact   Imaging: I personally read the images and my interpretation is MRI shows superficial rupture of the quadriceps tendon   12:08 PM   Arther Abbott  09/03/2021

## 2021-09-04 ENCOUNTER — Ambulatory Visit (HOSPITAL_COMMUNITY)
Admission: RE | Admit: 2021-09-04 | Discharge: 2021-09-04 | Disposition: A | Discharge status: Home / Self Care | Payer: Medicaid Other | Source: Ambulatory Visit | Attending: Orthopedic Surgery | Admitting: Orthopedic Surgery

## 2021-09-04 ENCOUNTER — Ambulatory Visit (HOSPITAL_COMMUNITY): Payer: Medicaid Other | Admitting: Anesthesiology

## 2021-09-04 ENCOUNTER — Other Ambulatory Visit: Payer: Self-pay

## 2021-09-04 ENCOUNTER — Encounter (HOSPITAL_COMMUNITY)
Admission: RE | Disposition: A | Discharge status: Home / Self Care | Payer: Self-pay | Source: Ambulatory Visit | Attending: Orthopedic Surgery

## 2021-09-04 ENCOUNTER — Encounter (HOSPITAL_COMMUNITY): Payer: Self-pay | Admitting: Orthopedic Surgery

## 2021-09-04 DIAGNOSIS — F32A Depression, unspecified: Secondary | ICD-10-CM | POA: Diagnosis not present

## 2021-09-04 DIAGNOSIS — F17210 Nicotine dependence, cigarettes, uncomplicated: Secondary | ICD-10-CM | POA: Diagnosis not present

## 2021-09-04 DIAGNOSIS — I10 Essential (primary) hypertension: Secondary | ICD-10-CM | POA: Insufficient documentation

## 2021-09-04 DIAGNOSIS — S76111D Strain of right quadriceps muscle, fascia and tendon, subsequent encounter: Secondary | ICD-10-CM

## 2021-09-04 DIAGNOSIS — F419 Anxiety disorder, unspecified: Secondary | ICD-10-CM | POA: Insufficient documentation

## 2021-09-04 DIAGNOSIS — X58XXXA Exposure to other specified factors, initial encounter: Secondary | ICD-10-CM | POA: Insufficient documentation

## 2021-09-04 DIAGNOSIS — E119 Type 2 diabetes mellitus without complications: Secondary | ICD-10-CM | POA: Insufficient documentation

## 2021-09-04 DIAGNOSIS — S76111A Strain of right quadriceps muscle, fascia and tendon, initial encounter: Secondary | ICD-10-CM

## 2021-09-04 DIAGNOSIS — B1920 Unspecified viral hepatitis C without hepatic coma: Secondary | ICD-10-CM | POA: Diagnosis not present

## 2021-09-04 DIAGNOSIS — R519 Headache, unspecified: Secondary | ICD-10-CM | POA: Diagnosis not present

## 2021-09-04 DIAGNOSIS — J449 Chronic obstructive pulmonary disease, unspecified: Secondary | ICD-10-CM | POA: Diagnosis not present

## 2021-09-04 DIAGNOSIS — K219 Gastro-esophageal reflux disease without esophagitis: Secondary | ICD-10-CM | POA: Insufficient documentation

## 2021-09-04 DIAGNOSIS — Z8673 Personal history of transient ischemic attack (TIA), and cerebral infarction without residual deficits: Secondary | ICD-10-CM | POA: Diagnosis not present

## 2021-09-04 HISTORY — PX: QUADRICEPS TENDON REPAIR: SHX756

## 2021-09-04 LAB — GLUCOSE, CAPILLARY
Glucose-Capillary: 122 mg/dL — ABNORMAL HIGH (ref 70–99)
Glucose-Capillary: 146 mg/dL — ABNORMAL HIGH (ref 70–99)

## 2021-09-04 SURGERY — REPAIR, TENDON, QUADRICEPS
Anesthesia: General | Site: Knee | Laterality: Right

## 2021-09-04 MED ORDER — SODIUM CHLORIDE 0.9 % IR SOLN
Status: DC | PRN
  Administered 2021-09-04: 1000 mL

## 2021-09-04 MED ORDER — DEXAMETHASONE SODIUM PHOSPHATE 10 MG/ML IJ SOLN
INTRAMUSCULAR | Status: AC
  Filled 2021-09-04: qty 1

## 2021-09-04 MED ORDER — LACTATED RINGERS IV SOLN: INTRAVENOUS | Status: DC

## 2021-09-04 MED ORDER — PROPOFOL 10 MG/ML IV BOLUS
INTRAVENOUS | Status: AC
  Filled 2021-09-04: qty 20

## 2021-09-04 MED ORDER — MIDAZOLAM HCL 2 MG/2ML IJ SOLN
INTRAMUSCULAR | Status: AC
  Filled 2021-09-04: qty 2

## 2021-09-04 MED ORDER — FENTANYL CITRATE (PF) 100 MCG/2ML IJ SOLN
INTRAMUSCULAR | Status: AC
  Filled 2021-09-04: qty 2

## 2021-09-04 MED ORDER — DEXAMETHASONE SODIUM PHOSPHATE 10 MG/ML IJ SOLN
INTRAMUSCULAR | Status: DC | PRN
  Administered 2021-09-04: 10 mg via INTRAVENOUS

## 2021-09-04 MED ORDER — SUCCINYLCHOLINE CHLORIDE 200 MG/10ML IV SOSY
PREFILLED_SYRINGE | INTRAVENOUS | Status: AC
  Filled 2021-09-04: qty 10

## 2021-09-04 MED ORDER — BUPIVACAINE-EPINEPHRINE (PF) 0.5% -1:200000 IJ SOLN
INTRAMUSCULAR | Status: DC | PRN
  Administered 2021-09-04: 30 mL

## 2021-09-04 MED ORDER — ROCURONIUM BROMIDE 10 MG/ML (PF) SYRINGE
PREFILLED_SYRINGE | INTRAVENOUS | Status: AC
  Filled 2021-09-04: qty 20

## 2021-09-04 MED ORDER — OXYCODONE-ACETAMINOPHEN 5-325 MG PO TABS
1.0000 | ORAL_TABLET | ORAL | 0 refills | Status: AC | PRN
Start: 2021-09-04 — End: 2021-09-09

## 2021-09-04 MED ORDER — ONDANSETRON HCL 4 MG/2ML IJ SOLN
INTRAMUSCULAR | Status: DC | PRN
  Administered 2021-09-04: 4 mg via INTRAVENOUS

## 2021-09-04 MED ORDER — BUPIVACAINE-EPINEPHRINE (PF) 0.5% -1:200000 IJ SOLN
INTRAMUSCULAR | Status: AC
  Filled 2021-09-04: qty 30

## 2021-09-04 MED ORDER — ONDANSETRON HCL 4 MG/2ML IJ SOLN: 4.0000 mg | Freq: Once | INTRAMUSCULAR | Status: DC | PRN

## 2021-09-04 MED ORDER — CEFAZOLIN SODIUM-DEXTROSE 2-4 GM/100ML-% IV SOLN
INTRAVENOUS | Status: AC
  Filled 2021-09-04: qty 100

## 2021-09-04 MED ORDER — FENTANYL CITRATE (PF) 100 MCG/2ML IJ SOLN
INTRAMUSCULAR | Status: DC | PRN
  Administered 2021-09-04: 50 ug via INTRAVENOUS
  Administered 2021-09-04: 100 ug via INTRAVENOUS
  Administered 2021-09-04: 50 ug via INTRAVENOUS

## 2021-09-04 MED ORDER — ORAL CARE MOUTH RINSE: 15.0000 mL | Freq: Once | OROMUCOSAL | Status: AC

## 2021-09-04 MED ORDER — CEFAZOLIN SODIUM-DEXTROSE 2-4 GM/100ML-% IV SOLN
2.0000 g | INTRAVENOUS | Status: AC
  Administered 2021-09-04: 13:00:00 2 g via INTRAVENOUS

## 2021-09-04 MED ORDER — CHLORHEXIDINE GLUCONATE 0.12 % MT SOLN
15.0000 mL | Freq: Once | OROMUCOSAL | Status: AC
  Administered 2021-09-04: 13:00:00 15 mL via OROMUCOSAL

## 2021-09-04 MED ORDER — MIDAZOLAM HCL 5 MG/5ML IJ SOLN
INTRAMUSCULAR | Status: DC | PRN
  Administered 2021-09-04: 2 mg via INTRAVENOUS

## 2021-09-04 MED ORDER — LIDOCAINE HCL (CARDIAC) PF 100 MG/5ML IV SOSY
PREFILLED_SYRINGE | INTRAVENOUS | Status: DC | PRN
  Administered 2021-09-04: 60 mg via INTRAVENOUS

## 2021-09-04 MED ORDER — PROPOFOL 10 MG/ML IV BOLUS
INTRAVENOUS | Status: DC | PRN
  Administered 2021-09-04: 120 mg via INTRAVENOUS

## 2021-09-04 MED ORDER — FENTANYL CITRATE PF 50 MCG/ML IJ SOSY: 25.0000 ug | PREFILLED_SYRINGE | INTRAMUSCULAR | Status: DC | PRN

## 2021-09-04 MED ORDER — ONDANSETRON HCL 4 MG/2ML IJ SOLN
INTRAMUSCULAR | Status: AC
  Filled 2021-09-04: qty 2

## 2021-09-04 SURGICAL SUPPLY — 53 items
ANCH SUT 2 CRKSCW 15.5X6.5 (Anchor) ×2 IMPLANT
ANCHOR CORKSCREW 6.5 FIBERWIRE (Anchor) ×4 IMPLANT
BANDAGE ESMARK 6X9 LF (GAUZE/BANDAGES/DRESSINGS) ×1 IMPLANT
BIT DRILL 2.8X128 (BIT) ×2 IMPLANT
BIT DRILL 2.8X128MM (BIT) ×1
BLADE SURG SZ10 CARB STEEL (BLADE) ×3 IMPLANT
BNDG CMPR 9X6 STRL LF SNTH (GAUZE/BANDAGES/DRESSINGS) ×1
BNDG CMPR STD VLCR NS LF 5.8X4 (GAUZE/BANDAGES/DRESSINGS) ×1
BNDG CMPR STD VLCR NS LF 5.8X6 (GAUZE/BANDAGES/DRESSINGS) ×1
BNDG COHESIVE 4X5 TAN STRL (GAUZE/BANDAGES/DRESSINGS) ×3 IMPLANT
BNDG ELASTIC 4X5.8 VLCR NS LF (GAUZE/BANDAGES/DRESSINGS) ×3 IMPLANT
BNDG ELASTIC 6X5.8 VLCR NS LF (GAUZE/BANDAGES/DRESSINGS) ×3 IMPLANT
BNDG ESMARK 6X9 LF (GAUZE/BANDAGES/DRESSINGS) ×3
CLOTH BEACON ORANGE TIMEOUT ST (SAFETY) ×3 IMPLANT
COVER LIGHT HANDLE STERIS (MISCELLANEOUS) ×6 IMPLANT
CUFF TOURN SGL QUICK 24 (TOURNIQUET CUFF) ×3
CUFF TRNQT CYL 24X4X16.5-23 (TOURNIQUET CUFF) IMPLANT
DRAPE INCISE IOBAN 44X35 STRL (DRAPES) ×3 IMPLANT
ELECT REM PT RETURN 9FT ADLT (ELECTROSURGICAL) ×3
ELECTRODE REM PT RTRN 9FT ADLT (ELECTROSURGICAL) ×1 IMPLANT
GAUZE XEROFORM 5X9 LF (GAUZE/BANDAGES/DRESSINGS) ×3 IMPLANT
GLOVE SS N UNI LF 8.5 STRL (GLOVE) ×3 IMPLANT
GLOVE SURG POLYISO LF SZ8 (GLOVE) ×3 IMPLANT
GLOVE SURG UNDER POLY LF SZ7 (GLOVE) ×6 IMPLANT
GOWN STRL REUS W/TWL LRG LVL3 (GOWN DISPOSABLE) ×6 IMPLANT
GOWN STRL REUS W/TWL XL LVL3 (GOWN DISPOSABLE) ×3 IMPLANT
IMMOBILIZER KNEE 19 UNV (ORTHOPEDIC SUPPLIES) ×2 IMPLANT
INST SET MAJOR BONE (KITS) ×3 IMPLANT
IV NS IRRIG 3000ML ARTHROMATIC (IV SOLUTION) ×3 IMPLANT
KIT TURNOVER KIT A (KITS) ×3 IMPLANT
MANIFOLD NEPTUNE II (INSTRUMENTS) ×3 IMPLANT
NDL HYPO 21X1.5 SAFETY (NEEDLE) ×1 IMPLANT
NDL MAYO 1/2 CRC TROCAR PT (NEEDLE) IMPLANT
NEEDLE HYPO 21X1.5 SAFETY (NEEDLE) ×3 IMPLANT
NEEDLE MAYO 1/2 CRC TROCAR PT (NEEDLE) ×3 IMPLANT
NS IRRIG 1000ML POUR BTL (IV SOLUTION) ×3 IMPLANT
PACK BASIC LIMB (CUSTOM PROCEDURE TRAY) ×3 IMPLANT
PAD ABD 5X9 TENDERSORB (GAUZE/BANDAGES/DRESSINGS) ×3 IMPLANT
PAD ARMBOARD 7.5X6 YLW CONV (MISCELLANEOUS) ×3 IMPLANT
PAD CAST 4YDX4 CTTN HI CHSV (CAST SUPPLIES) ×1 IMPLANT
PADDING CAST COTTON 4X4 STRL (CAST SUPPLIES) ×3
PADDING CAST COTTON 6X4 STRL (CAST SUPPLIES) ×3 IMPLANT
PASSER SUT SWANSON 36MM LOOP (INSTRUMENTS) ×3 IMPLANT
SET BASIN LINEN APH (SET/KITS/TRAYS/PACK) ×3 IMPLANT
SPONGE T-LAP 18X18 ~~LOC~~+RFID (SPONGE) ×6 IMPLANT
STAPLER VISISTAT 35W (STAPLE) ×3 IMPLANT
SUT ETHIBOND 5 LR DA (SUTURE) ×4 IMPLANT
SUT MON AB 0 CT1 (SUTURE) ×5 IMPLANT
SUT MON AB 2-0 CT1 36 (SUTURE) ×3 IMPLANT
SUT VIC AB 1 CT1 27 (SUTURE) ×6
SUT VIC AB 1 CT1 27XBRD ANTBC (SUTURE) ×2 IMPLANT
SYR 30ML LL (SYRINGE) ×3 IMPLANT
SYR BULB IRRIG 60ML STRL (SYRINGE) ×3 IMPLANT

## 2021-09-04 NOTE — Transfer of Care (Signed)
Immediate Anesthesia Transfer of Care Note  Patient: Danny [name]  Procedure(s) Performed: REPAIR QUADRICEP TENDON (Right: Knee)  Patient Location: PACU  Anesthesia Type:General  Level of Consciousness: sedated and patient cooperative  Airway & Oxygen Therapy: Patient Spontanous Breathing and non-rebreather face mask  Post-op Assessment: Report given to RN and Post -op Vital signs reviewed and stable  Post vital signs: Reviewed and stable  Last Vitals:  Vitals Value Taken Time  BP 106/53 09/04/21 1430  Temp 36.8 C 09/04/21 1430  Pulse 91 09/04/21 1431  Resp 9 09/04/21 1431  SpO2 97 % 09/04/21 1431  Vitals shown include unvalidated device data.  Last Pain:  Vitals:   09/04/21 1240  TempSrc: Oral  PainSc: 4       Patients Stated Pain Goal: 4 (41/14/64 3142)  Complications: No notable events documented.

## 2021-09-04 NOTE — Op Note (Signed)
09/04/2021  2:30 PM  PATIENT:  Danny [name]  55 y.o. male  PRE-OPERATIVE DIAGNOSIS:  Quadricep tendon tear right  POST-OPERATIVE DIAGNOSIS:  Quadricep tendon tear right  PROCEDURE:  Procedure(s): REPAIR QUADRICEP TENDON (Right)  Takedown of partial rupture and repair of quadriceps tendon to patella  Implants: Corkscrew anchor 6.5  Findings partially torn quadricep tendon with partial healing.  There was abnormal tension in the distal portion of the central portion of the quadriceps tendon.  There was no obvious separation.  I therefore explored the patellar tendon which was intact.  When the knee was placed in extension the tendon had a wavy soft appearance with a nodular portion approximately 3-1/2 cm above the tendon insertion    SURGEON:  Surgeon(s) and Role:    * Carole Civil, MD - Primary  PHYSICIAN ASSISTANT:   ASSISTANTS: Fulton Mole first assistant  ANESTHESIA:   general  EBL:  10 mL   BLOOD ADMINISTERED:none  DRAINS: none   LOCAL MEDICATIONS USED:  MARCAINE     SPECIMEN:  No Specimen  DISPOSITION OF SPECIMEN:  N/A  COUNTS:  YES  TOURNIQUET:   Total Tourniquet Time Documented: Thigh (Right) - 68 minutes Total: Thigh (Right) - 68 minutes   DICTATION: .Viviann Spare Dictation  PLAN OF CARE: Discharge to home after PACU  PATIENT DISPOSITION:  PACU - hemodynamically stable.   Delay start of Pharmacological VTE agent (>24hrs) due to surgical blood loss or risk of bleeding: no  The procedure was done as follows  The patient was seen in the preop area and I cleared him for surgery on his right knee which was marked with my initials.  Chart review was performed along with a review of the images  The patient was taken to surgery placed supine on the operating table and he had an LMA anesthetic  His right leg was then prepped and draped sterilely with a tourniquet applied proximally  After timeout was completed the limb was exsanguinated with a 4  inch Esmarch the tourniquet was elevated 280 minutes of mercury  The midline incision was made from 3 cm above the patella to mid patellar.  Subcutaneous tissue was divided quadricep tendon was exposed.  There was no obvious rupture however the tension on the tendon was abnormal.  The knee was placed in flexion and there was a nodular area 3-1/2 cm to 4 cm above the quadriceps tendon insertion.  To be sure that this was the area of disruption I explored the patellar tendon which was intact  I therefore extended the incision proximally and detached the quadriceps tendon from the patella.  I made an incision on each side of the quadriceps tendon full-thickness and then removed the quadriceps tendon periosteal flap from the patella and debrided the end of the tendon which was degenerated nodular with amyloid like tissue at the end of the tendon.  I then took around your and debrided the soft tissue of the patella tendon attachment of the quadriceps  I then used a 2.8 drill bit to place 2 holes in the patella followed by insertion of a 6 5 corkscrew anchor each with 2 sutures.  I passed the 2 sutures through the tendon, 1 from each anchor and I used the other 2 sutures with 1 pass through the tendon as holding sutures.   I flexed the knee up to 90 degrees and was able to get excursion to the patellar tendon easily  Using the holding sutures I tied the other  2 sutures proximally and then passed the other holding sutures in an inverted mattress fashion and tied all of those sutures  I was able to flex the knee 100 degrees with tension occurring in about 85 degrees of flexion  I close the defect in flexion on each side of the quadriceps tendon with running #1 Vicryl suture  After irrigation the wound was closed with interrupted 0 Monocryl and then skin was reapproximated with staples.  I did inject 30 cc of Marcaine around the soft tissues of the retinaculum and quadriceps  Postop plan  The knee will  stay in extension in a brace for the first 2 weeks  He should be able to go 0 to 60  degrees in a hinged knee brace at week 2 through 4,  Then 0 to 90-week 4 through 6 Then advance as tolerated after week 6

## 2021-09-04 NOTE — Interval H&P Note (Signed)
History and Physical Interval Note:  09/04/2021 12:42 PM  Danny [name]  has presented today for surgery, with the diagnosis of Quadricep tendon tear right.  The various methods of treatment have been discussed with the patient and family. After consideration of risks, benefits and other options for treatment, the patient has consented to  Procedure(s): REPAIR QUADRICEP TENDON (Right) as a surgical intervention.  The patient's history has been reviewed, patient examined, no change in status, stable for surgery.  I have reviewed the patient's chart and labs.  Questions were answered to the patient's satisfaction.     Arther Abbott

## 2021-09-04 NOTE — Brief Op Note (Signed)
09/04/2021  2:30 PM  PATIENT:  Danny [name]  55 y.o. male  PRE-OPERATIVE DIAGNOSIS:  Quadricep tendon tear right  POST-OPERATIVE DIAGNOSIS:  Quadricep tendon tear right  PROCEDURE:  Procedure(s): REPAIR QUADRICEP TENDON (Right)  Takedown of partial rupture and repair of quadriceps tendon to patella  Implants: Corkscrew anchor 6.5  Findings partially torn quadricep tendon with partial healing.  There was abnormal tension in the distal portion of the central portion of the quadriceps tendon.  There was no obvious separation.  I therefore explored the patellar tendon which was intact.  When the knee was placed in extension the tendon had a wavy soft appearance with a nodular portion approximately 3-1/2 cm above the tendon insertion    SURGEON:  Surgeon(s) and Role:    * Carole Civil, MD - Primary  PHYSICIAN ASSISTANT:   ASSISTANTS: Fulton Mole first assistant  ANESTHESIA:   general  EBL:  10 mL   BLOOD ADMINISTERED:none  DRAINS: none   LOCAL MEDICATIONS USED:  MARCAINE     SPECIMEN:  No Specimen  DISPOSITION OF SPECIMEN:  N/A  COUNTS:  YES  TOURNIQUET:   Total Tourniquet Time Documented: Thigh (Right) - 68 minutes Total: Thigh (Right) - 68 minutes   DICTATION: .Viviann Spare Dictation  PLAN OF CARE: Discharge to home after PACU  PATIENT DISPOSITION:  PACU - hemodynamically stable.   Delay start of Pharmacological VTE agent (>24hrs) due to surgical blood loss or risk of bleeding: no  The procedure was done as follows  The patient was seen in the preop area and I cleared him for surgery on his right knee which was marked with my initials.  Chart review was performed along with a review of the images  The patient was taken to surgery placed supine on the operating table and he had an LMA anesthetic  His right leg was then prepped and draped sterilely with a tourniquet applied proximally  After timeout was completed the limb was exsanguinated with a 4  inch Esmarch the tourniquet was elevated 280 minutes of mercury  The midline incision was made from 3 cm above the patella to mid patellar.  Subcutaneous tissue was divided quadricep tendon was exposed.  There was no obvious rupture however the tension on the tendon was abnormal.  The knee was placed in flexion and there was a nodular area 3-1/2 cm to 4 cm above the quadriceps tendon insertion.  To be sure that this was the area of disruption I explored the patellar tendon which was intact  I therefore extended the incision proximally and detached the quadriceps tendon from the patella.  I made an incision on each side of the quadriceps tendon full-thickness and then removed the quadriceps tendon periosteal flap from the patella and debrided the end of the tendon which was degenerated nodular with amyloid like tissue at the end of the tendon.  I then took around your and debrided the soft tissue of the patella tendon attachment of the quadriceps  I then used a 2.8 drill bit to place 2 holes in the patella followed by insertion of a 6 5 corkscrew anchor each with 2 sutures.  I passed the 2 sutures through the tendon, 1 from each anchor and I used the other 2 sutures with 1 pass through the tendon as holding sutures.   I flexed the knee up to 90 degrees and was able to get excursion to the patellar tendon easily  Using the holding sutures I tied the other  2 sutures proximally and then passed the other holding sutures in an inverted mattress fashion and tied all of those sutures  I was able to flex the knee 100 degrees with tension occurring in about 85 degrees of flexion  I close the defect in flexion on each side of the quadriceps tendon with running #1 Vicryl suture  After irrigation the wound was closed with interrupted 0 Monocryl and then skin was reapproximated with staples.  I did inject 30 cc of Marcaine around the soft tissues of the retinaculum and quadriceps  Postop plan  The knee will  stay in extension in a brace for the first 2 weeks  He should be able to go 0 to 60  degrees in a hinged knee brace at week 2 through 4,  Then 0 to 90-week 4 through 6 Then advance as tolerated after week 6

## 2021-09-04 NOTE — Anesthesia Procedure Notes (Signed)
Procedure Name: LMA Insertion Date/Time: 09/04/2021 1:00 PM Performed by: Minerva Ends, CRNA Pre-anesthesia Checklist: Patient identified, Emergency Drugs available, Suction available and Patient being monitored Patient Re-evaluated:Patient Re-evaluated prior to induction Oxygen Delivery Method: Circle system utilized Preoxygenation: Pre-oxygenation with 100% oxygen Induction Type: IV induction Ventilation: Mask ventilation without difficulty LMA: LMA inserted LMA Size: 4.0 Tube type: Oral Number of attempts: 1 Airway Equipment and Method: Stylet Placement Confirmation: positive ETCO2 and breath sounds checked- equal and bilateral Tube secured with: Tape Dental Injury: Teeth and Oropharynx as per pre-operative assessment

## 2021-09-04 NOTE — Anesthesia Preprocedure Evaluation (Signed)
Anesthesia Evaluation  Patient identified by MRN, date of birth, ID band Patient awake    Reviewed: Allergy & Precautions, H&P , NPO status , Patient's Chart, lab work & pertinent test results, reviewed documented beta blocker date and time   Airway Mallampati: II  TM Distance: >3 FB Neck ROM: full    Dental no notable dental hx.    Pulmonary COPD, Current Smoker,    Pulmonary exam normal breath sounds clear to auscultation       Cardiovascular Exercise Tolerance: Good hypertension, negative cardio ROS   Rhythm:regular Rate:Normal     Neuro/Psych  Headaches, PSYCHIATRIC DISORDERS Anxiety Depression CVA    GI/Hepatic GERD  Medicated,(+) Hepatitis -, C  Endo/Other  negative endocrine ROSdiabetes, Type 2  Renal/GU negative Renal ROS  negative genitourinary   Musculoskeletal   Abdominal   Peds  Hematology negative hematology ROS (+)   Anesthesia Other Findings   Reproductive/Obstetrics negative OB ROS                             Anesthesia Physical Anesthesia Plan  ASA: 3  Anesthesia Plan: General and General LMA   Post-op Pain Management:    Induction:   PONV Risk Score and Plan:   Airway Management Planned:   Additional Equipment:   Intra-op Plan:   Post-operative Plan:   Informed Consent: I have reviewed the patients History and Physical, chart, labs and discussed the procedure including the risks, benefits and alternatives for the proposed anesthesia with the patient or authorized representative who has indicated his/her understanding and acceptance.     Dental Advisory Given  Plan Discussed with: CRNA  Anesthesia Plan Comments:         Anesthesia Quick Evaluation

## 2021-09-05 ENCOUNTER — Encounter (HOSPITAL_COMMUNITY): Payer: Self-pay | Admitting: Orthopedic Surgery

## 2021-09-05 NOTE — Anesthesia Postprocedure Evaluation (Signed)
Anesthesia Post Note  Patient: Danny [name]  Procedure(s) Performed: REPAIR QUADRICEP TENDON (Right: Knee)  Patient location during evaluation: Phase II Anesthesia Type: General Level of consciousness: awake Pain management: pain level controlled Vital Signs Assessment: post-procedure vital signs reviewed and stable Respiratory status: spontaneous breathing and respiratory function stable Cardiovascular status: blood pressure returned to baseline and stable Postop Assessment: no headache and no apparent nausea or vomiting Anesthetic complications: no Comments: Late entry   No notable events documented.   Last Vitals:  Vitals:   09/04/21 1515 09/04/21 1530  BP: 116/60 137/72  Pulse: 91 94  Resp: 10 17  Temp:  36.9 C  SpO2: 94% 93%    Last Pain:  Vitals:   09/04/21 1515  TempSrc:   PainSc: 0-No pain                 Louann Sjogren

## 2021-09-11 ENCOUNTER — Telehealth: Payer: Self-pay | Admitting: Radiology

## 2021-09-11 NOTE — Telephone Encounter (Signed)
-----   Message from [facility] May, RT sent at 09/10/2021  9:35 PM EST ----- Regarding: FW: Forwarding #please review 09/12/20 for post op appt per Dr Lemmie Evens message I had called this patient last week after this staff message was sent, had to LM.  Will one of you please try to reach out as well and get him in to have wound checked?   ----- Message ----- From: Uvaldo Bristle Sent: 09/04/2021   5:11 PM EST To: Wendy May, RT Subject: Forwarding #please review 09/12/20 for post op#   ----- Message ----- From: Carole Civil, MD Sent: 09/04/2021   2:44 PM EST To: Santo Held, Scharlene Gloss, #  Jan 4 ck wound

## 2021-09-11 NOTE — Telephone Encounter (Signed)
He will be here tomorrow at 9 can you put on schedule ?

## 2021-09-12 ENCOUNTER — Encounter: Payer: Self-pay | Admitting: Orthopedic Surgery

## 2021-09-12 ENCOUNTER — Ambulatory Visit (INDEPENDENT_AMBULATORY_CARE_PROVIDER_SITE_OTHER): Payer: Medicaid Other | Admitting: Orthopedic Surgery

## 2021-09-12 ENCOUNTER — Other Ambulatory Visit: Payer: Self-pay

## 2021-09-12 DIAGNOSIS — G8918 Other acute postprocedural pain: Secondary | ICD-10-CM

## 2021-09-12 DIAGNOSIS — T8149XA Infection following a procedure, other surgical site, initial encounter: Secondary | ICD-10-CM

## 2021-09-12 DIAGNOSIS — S76111D Strain of right quadriceps muscle, fascia and tendon, subsequent encounter: Secondary | ICD-10-CM

## 2021-09-12 MED ORDER — CEPHALEXIN 500 MG PO CAPS: 500.0000 mg | ORAL_CAPSULE | Freq: Two times a day (BID) | ORAL | 0 refills | Status: DC

## 2021-09-12 MED ORDER — OXYCODONE-ACETAMINOPHEN 5-325 MG PO TABS: 1.0000 | ORAL_TABLET | Freq: Four times a day (QID) | ORAL | 0 refills | Status: DC | PRN

## 2021-09-12 NOTE — Progress Notes (Signed)
POST OP   Chief Complaint  Patient presents with   Post-op Follow-up    09/04/21 right quad tendon repair     Encounter Diagnoses  Name Primary?   Rupture of right quadriceps tendon, subsequent encounter repair 09/04/21    Post-op pain    Cellulitis, wound, post-operative Yes    Postop plan   The knee will stay in extension in a brace for the first 2 weeks   He should be able to go 0 to 60  degrees in a hinged knee brace at week 2 through 4,  Then 0 to 90-week 4 through 6 Then advance as tolerated after week 6  09/04/2021  2:30 PM  PATIENT:  Danny [name]  56 y.o. male  PRE-OPERATIVE DIAGNOSIS:  Quadricep tendon tear right  POST-OPERATIVE DIAGNOSIS:  Quadricep tendon tear right  PROCEDURE:  Procedure(s): REPAIR QUADRICEP TENDON (Right)  Takedown of partial rupture and repair of quadriceps tendon to patella  Implants: Corkscrew anchor 6.5  Findings partially torn quadricep tendon with partial healing.  There was abnormal tension in the distal portion of the central portion of the quadriceps tendon.  There was no obvious separation.  I therefore explored the patellar tendon which was intact.  When the knee was placed in extension the tendon had a wavy soft appearance with a nodular portion approximately 3-1/2 cm above the tendon insertion    Postop plan  The knee will stay in extension in a brace for the first 2 weeks  He should be able to go 0 to 60  degrees in a hinged knee brace at week 2 through 4,  Then 0 to 90-week 4 through 6 Then advance as tolerated after week 6  09/04/2021  2:30 PM  PATIENT:  Danny [name]  56 y.o. male  PRE-OPERATIVE DIAGNOSIS:  Quadricep tendon tear right  POST-OPERATIVE DIAGNOSIS:  Quadricep tendon tear right  PROCEDURE:  Procedure(s): REPAIR QUADRICEP TENDON (Right)  Takedown of partial rupture and repair of quadriceps tendon to patella  Implants: Corkscrew anchor 6.5  Findings partially torn quadricep tendon  with partial healing.  There was abnormal tension in the distal portion of the central portion of the quadriceps tendon.  There was no obvious separation.  I therefore explored the patellar tendon which was intact.  When the knee was placed in extension the tendon had a wavy soft appearance with a nodular portion approximately 3-1/2 cm above the tendon insertion   Postop plan  The knee will stay in extension in a brace for the first 2 weeks  He should be able to go 0 to 60  degrees in a hinged knee brace at week 2 through 4,  Then 0 to 90-week 4 through 6 Then advance as tolerated after week 6 PROCEDURE:  POV # 1, POD # 8  Meds related  Meds ordered this encounter  Medications   oxyCODONE-acetaminophen (PERCOCET/ROXICET) 5-325 MG tablet    Sig: Take 1 tablet by mouth every 6 (six) hours as needed for up to 5 days for severe pain.    Dispense:  20 tablet    Refill:  0   cephALEXin (KEFLEX) 500 MG capsule    Sig: Take 1 capsule (500 mg total) by mouth 2 (two) times daily for 14 days.    Dispense:  28 capsule    Refill:  0     Danny [name] fell in our parking lot today he had his brace on  His wound looks good except for the  area around the patella so shown erythema which I put him on Keflex for  He can start his oxycodone taper follow-up in 1 week to get the staples out and placed in a hinged brace 0-60

## 2021-09-19 ENCOUNTER — Encounter: Payer: Self-pay | Admitting: Orthopedic Surgery

## 2021-09-19 ENCOUNTER — Other Ambulatory Visit (HOSPITAL_COMMUNITY): Payer: Self-pay | Admitting: Interventional Radiology

## 2021-09-19 ENCOUNTER — Other Ambulatory Visit: Payer: Self-pay

## 2021-09-19 ENCOUNTER — Ambulatory Visit (INDEPENDENT_AMBULATORY_CARE_PROVIDER_SITE_OTHER): Payer: Medicaid Other | Admitting: Orthopedic Surgery

## 2021-09-19 DIAGNOSIS — C220 Liver cell carcinoma: Secondary | ICD-10-CM

## 2021-09-19 DIAGNOSIS — S76111D Strain of right quadriceps muscle, fascia and tendon, subsequent encounter: Secondary | ICD-10-CM

## 2021-09-19 DIAGNOSIS — G8918 Other acute postprocedural pain: Secondary | ICD-10-CM

## 2021-09-19 DIAGNOSIS — T8149XA Infection following a procedure, other surgical site, initial encounter: Secondary | ICD-10-CM

## 2021-09-19 MED ORDER — DOXYCYCLINE HYCLATE 100 MG PO TABS: 100.0000 mg | ORAL_TABLET | Freq: Two times a day (BID) | ORAL | 1 refills | Status: DC

## 2021-09-19 MED ORDER — OXYCODONE-ACETAMINOPHEN 5-325 MG PO TABS: 1.0000 | ORAL_TABLET | Freq: Four times a day (QID) | ORAL | 0 refills | Status: AC | PRN

## 2021-09-19 NOTE — Progress Notes (Signed)
Chief Complaint  Patient presents with   Post-op Follow-up    REPAIR QUADRICEP TENDON (Right) DOS 09/04/21    Encounter Diagnoses  Name Primary?   Rupture of right quadriceps tendon, subsequent encounter repair 09/04/21    Post-op pain    Cellulitis, wound, post-operative Yes    Danny [name] had a takedown of his quadriceps tendon partial tear and tendinosis  I put him on Keflex last week for some erythema around the patella it did not help  We took the staples out today hopefully that will take some tension off the skin and then I put him on doxycycline  I did not place him in a brace to allow the wound to improve  When he comes back we will check the wound again if he is not better we will have to go with IV antibiotics   Meds ordered this encounter  Medications   doxycycline (VIBRA-TABS) 100 MG tablet    Sig: Take 1 tablet (100 mg total) by mouth 2 (two) times daily.    Dispense:  28 tablet    Refill:  1   oxyCODONE-acetaminophen (PERCOCET/ROXICET) 5-325 MG tablet    Sig: Take 1 tablet by mouth every 6 (six) hours as needed for up to 5 days for severe pain.    Dispense:  20 tablet    Refill:  0   Poss brace depending on the wound

## 2021-09-24 ENCOUNTER — Encounter (HOSPITAL_COMMUNITY): Payer: Self-pay

## 2021-09-24 ENCOUNTER — Ambulatory Visit (HOSPITAL_COMMUNITY)
Admission: RE | Admit: 2021-09-24 | Discharge: 2021-09-24 | Disposition: A | Discharge status: Home / Self Care | Payer: Medicaid Other | Source: Ambulatory Visit | Attending: Nurse Practitioner | Admitting: Nurse Practitioner

## 2021-09-24 ENCOUNTER — Other Ambulatory Visit: Payer: Self-pay

## 2021-09-24 DIAGNOSIS — C220 Liver cell carcinoma: Secondary | ICD-10-CM | POA: Diagnosis present

## 2021-09-24 LAB — POCT I-STAT CREATININE: Creatinine, Ser: 0.9 mg/dL (ref 0.61–1.24)

## 2021-09-24 MED ORDER — IOHEXOL 300 MG/ML  SOLN
100.0000 mL | Freq: Once | INTRAMUSCULAR | Status: AC | PRN
  Administered 2021-09-24: 75 mL via INTRAVENOUS

## 2021-09-26 ENCOUNTER — Encounter: Payer: Self-pay | Admitting: Orthopedic Surgery

## 2021-09-26 ENCOUNTER — Other Ambulatory Visit: Payer: Self-pay

## 2021-09-26 ENCOUNTER — Inpatient Hospital Stay (HOSPITAL_COMMUNITY)
Admission: AD | Admit: 2021-09-26 | Discharge: 2021-09-29 | DRG: 920 | Disposition: A | Discharge status: Home / Self Care | Payer: Medicaid Other | Source: Ambulatory Visit | Attending: Orthopedic Surgery | Admitting: Orthopedic Surgery

## 2021-09-26 ENCOUNTER — Encounter (HOSPITAL_COMMUNITY): Payer: Self-pay | Admitting: Orthopedic Surgery

## 2021-09-26 ENCOUNTER — Ambulatory Visit (INDEPENDENT_AMBULATORY_CARE_PROVIDER_SITE_OTHER): Payer: Medicaid Other | Admitting: Orthopedic Surgery

## 2021-09-26 DIAGNOSIS — F17210 Nicotine dependence, cigarettes, uncomplicated: Secondary | ICD-10-CM | POA: Diagnosis present

## 2021-09-26 DIAGNOSIS — L03115 Cellulitis of right lower limb: Secondary | ICD-10-CM | POA: Diagnosis present

## 2021-09-26 DIAGNOSIS — Y818 Miscellaneous general- and plastic-surgery devices associated with adverse incidents, not elsewhere classified: Secondary | ICD-10-CM | POA: Diagnosis present

## 2021-09-26 DIAGNOSIS — T8149XA Infection following a procedure, other surgical site, initial encounter: Secondary | ICD-10-CM | POA: Diagnosis present

## 2021-09-26 DIAGNOSIS — Z79899 Other long term (current) drug therapy: Secondary | ICD-10-CM

## 2021-09-26 DIAGNOSIS — Z8249 Family history of ischemic heart disease and other diseases of the circulatory system: Secondary | ICD-10-CM

## 2021-09-26 DIAGNOSIS — Z8673 Personal history of transient ischemic attack (TIA), and cerebral infarction without residual deficits: Secondary | ICD-10-CM

## 2021-09-26 DIAGNOSIS — Z20822 Contact with and (suspected) exposure to covid-19: Secondary | ICD-10-CM | POA: Diagnosis present

## 2021-09-26 DIAGNOSIS — Z7984 Long term (current) use of oral hypoglycemic drugs: Secondary | ICD-10-CM

## 2021-09-26 DIAGNOSIS — F1010 Alcohol abuse, uncomplicated: Secondary | ICD-10-CM | POA: Diagnosis present

## 2021-09-26 DIAGNOSIS — Y838 Other surgical procedures as the cause of abnormal reaction of the patient, or of later complication, without mention of misadventure at the time of the procedure: Secondary | ICD-10-CM | POA: Diagnosis present

## 2021-09-26 DIAGNOSIS — F419 Anxiety disorder, unspecified: Secondary | ICD-10-CM | POA: Diagnosis present

## 2021-09-26 DIAGNOSIS — I10 Essential (primary) hypertension: Secondary | ICD-10-CM | POA: Diagnosis present

## 2021-09-26 DIAGNOSIS — E119 Type 2 diabetes mellitus without complications: Secondary | ICD-10-CM | POA: Diagnosis present

## 2021-09-26 DIAGNOSIS — F32A Depression, unspecified: Secondary | ICD-10-CM | POA: Diagnosis present

## 2021-09-26 DIAGNOSIS — M9689 Other intraoperative and postprocedural complications and disorders of the musculoskeletal system: Principal | ICD-10-CM | POA: Diagnosis present

## 2021-09-26 DIAGNOSIS — Z888 Allergy status to other drugs, medicaments and biological substances status: Secondary | ICD-10-CM

## 2021-09-26 DIAGNOSIS — S76111D Strain of right quadriceps muscle, fascia and tendon, subsequent encounter: Secondary | ICD-10-CM

## 2021-09-26 LAB — CBC
HCT: 45.5 % (ref 39.0–52.0)
Hemoglobin: 15.1 g/dL (ref 13.0–17.0)
MCH: 29 pg (ref 26.0–34.0)
MCHC: 33.2 g/dL (ref 30.0–36.0)
MCV: 87.3 fL (ref 80.0–100.0)
Platelets: 156 10*3/uL (ref 150–400)
RBC: 5.21 MIL/uL (ref 4.22–5.81)
RDW: 13.4 % (ref 11.5–15.5)
WBC: 7.9 10*3/uL (ref 4.0–10.5)
nRBC: 0 % (ref 0.0–0.2)

## 2021-09-26 LAB — GLUCOSE, CAPILLARY
Glucose-Capillary: 161 mg/dL — ABNORMAL HIGH (ref 70–99)
Glucose-Capillary: 118 mg/dL — ABNORMAL HIGH (ref 70–99)
Glucose-Capillary: 166 mg/dL — ABNORMAL HIGH (ref 70–99)

## 2021-09-26 LAB — COMPREHENSIVE METABOLIC PANEL
ALT: 49 U/L — ABNORMAL HIGH (ref 0–44)
AST: 63 U/L — ABNORMAL HIGH (ref 15–41)
Albumin: 3.6 g/dL (ref 3.5–5.0)
Alkaline Phosphatase: 105 U/L (ref 38–126)
Anion gap: 11 (ref 5–15)
BUN: 13 mg/dL (ref 6–20)
CO2: 24 mmol/L (ref 22–32)
Calcium: 9.5 mg/dL (ref 8.9–10.3)
Chloride: 104 mmol/L (ref 98–111)
Creatinine, Ser: 0.85 mg/dL (ref 0.61–1.24)
GFR, Estimated: >60 mL/min (ref >60)
Glucose, Bld: 115 mg/dL — ABNORMAL HIGH (ref 70–99)
Potassium: 4.1 mmol/L (ref 3.5–5.1)
Sodium: 139 mmol/L (ref 135–145)
Total Bilirubin: 0.9 mg/dL (ref 0.3–1.2)
Total Protein: 7.2 g/dL (ref 6.5–8.1)

## 2021-09-26 LAB — SEDIMENTATION RATE: Sed Rate: 3 mm/hr (ref 0–16)

## 2021-09-26 LAB — RESP PANEL BY RT-PCR (FLU A&B, COVID) ARPGX2
Influenza A by PCR: NEGATIVE
Influenza B by PCR: NEGATIVE
SARS Coronavirus 2 by RT PCR: NEGATIVE

## 2021-09-26 LAB — C-REACTIVE PROTEIN: CRP: 0.5 mg/dL (ref <1.0)

## 2021-09-26 MED ORDER — PANTOPRAZOLE SODIUM 40 MG PO TBEC
40.0000 mg | DELAYED_RELEASE_TABLET | Freq: Every day | ORAL | Status: DC
  Administered 2021-09-26 – 2021-09-29 (×4): 40 mg via ORAL
  Filled 2021-09-26 (×4): qty 1

## 2021-09-26 MED ORDER — METFORMIN HCL 500 MG PO TABS
500.0000 mg | ORAL_TABLET | Freq: Two times a day (BID) | ORAL | Status: DC
  Administered 2021-09-26 – 2021-09-29 (×6): 500 mg via ORAL
  Filled 2021-09-26 (×6): qty 1

## 2021-09-26 MED ORDER — QUETIAPINE FUMARATE 100 MG PO TABS
100.0000 mg | ORAL_TABLET | Freq: Every day | ORAL | Status: DC
  Administered 2021-09-26 – 2021-09-28 (×3): 100 mg via ORAL
  Filled 2021-09-26 (×3): qty 1

## 2021-09-26 MED ORDER — VANCOMYCIN HCL 1500 MG/300ML IV SOLN
1500.0000 mg | Freq: Once | INTRAVENOUS | Status: AC
  Administered 2021-09-26: 1500 mg via INTRAVENOUS
  Filled 2021-09-26: qty 300

## 2021-09-26 MED ORDER — GABAPENTIN 300 MG PO CAPS
600.0000 mg | ORAL_CAPSULE | Freq: Three times a day (TID) | ORAL | Status: DC
  Administered 2021-09-26 – 2021-09-29 (×9): 600 mg via ORAL
  Filled 2021-09-26 (×9): qty 2

## 2021-09-26 MED ORDER — CITALOPRAM HYDROBROMIDE 20 MG PO TABS
20.0000 mg | ORAL_TABLET | Freq: Every day | ORAL | Status: DC
Start: 2021-09-26 — End: 2021-09-29
  Administered 2021-09-26 – 2021-09-29 (×4): 20 mg via ORAL
  Filled 2021-09-26 (×4): qty 1

## 2021-09-26 MED ORDER — VANCOMYCIN HCL IN DEXTROSE 1-5 GM/200ML-% IV SOLN
1000.0000 mg | Freq: Two times a day (BID) | INTRAVENOUS | Status: DC
  Administered 2021-09-27 – 2021-09-29 (×5): 1000 mg via INTRAVENOUS
  Filled 2021-09-26 (×5): qty 200

## 2021-09-26 MED ORDER — HEPARIN SODIUM (PORCINE) 5000 UNIT/ML IJ SOLN
5000.0000 [IU] | Freq: Three times a day (TID) | INTRAMUSCULAR | Status: DC
  Administered 2021-09-26 – 2021-09-29 (×8): 5000 [IU] via SUBCUTANEOUS
  Filled 2021-09-26 (×9): qty 1

## 2021-09-26 MED ORDER — DOCUSATE SODIUM 100 MG PO CAPS
100.0000 mg | ORAL_CAPSULE | Freq: Two times a day (BID) | ORAL | Status: DC
  Administered 2021-09-26 – 2021-09-29 (×7): 100 mg via ORAL
  Filled 2021-09-26 (×7): qty 1

## 2021-09-26 MED ORDER — BUSPIRONE HCL 5 MG PO TABS
15.0000 mg | ORAL_TABLET | Freq: Two times a day (BID) | ORAL | Status: DC
  Administered 2021-09-26 – 2021-09-29 (×7): 15 mg via ORAL
  Filled 2021-09-26 (×7): qty 3

## 2021-09-26 MED ORDER — VITAMIN D 25 MCG (1000 UNIT) PO TABS
5000.0000 [IU] | ORAL_TABLET | Freq: Every day | ORAL | Status: DC
  Administered 2021-09-26 – 2021-09-29 (×4): 5000 [IU] via ORAL
  Filled 2021-09-26 (×4): qty 5

## 2021-09-26 MED ORDER — MORPHINE SULFATE (PF) 4 MG/ML IV SOLN
4.0000 mg | INTRAVENOUS | Status: DC | PRN
  Administered 2021-09-26 – 2021-09-28 (×11): 4 mg via INTRAVENOUS
  Filled 2021-09-26 (×11): qty 1

## 2021-09-26 MED ORDER — OXYCODONE HCL 5 MG PO TABS
5.0000 mg | ORAL_TABLET | ORAL | Status: DC | PRN
  Administered 2021-09-26 – 2021-09-29 (×8): 5 mg via ORAL
  Filled 2021-09-26 (×8): qty 1

## 2021-09-26 MED ORDER — HYDROXYZINE HCL 25 MG PO TABS: 25.0000 mg | ORAL_TABLET | Freq: Three times a day (TID) | ORAL | Status: DC | PRN

## 2021-09-26 MED ORDER — ALBUTEROL SULFATE HFA 108 (90 BASE) MCG/ACT IN AERS: 2.0000 | INHALATION_SPRAY | Freq: Four times a day (QID) | RESPIRATORY_TRACT | Status: DC | PRN

## 2021-09-26 MED ORDER — SODIUM CHLORIDE 0.9 % IV SOLN: INTRAVENOUS | Status: DC

## 2021-09-26 NOTE — H&P (Signed)
Danny [name] is an 56 y.o. male.   Chief Complaint: Pain erythema right knee HPI: Danny [name] is 56 years old he had repair of a partial quadriceps tendon rupture about 3 weeks ago postoperatively developed cellulitis around the mid to distal portion of his right knee wound and was treated with Keflex.  After 1 week the Keflex was changed to doxycycline due to failure to improve the situation regarding the incision.  After 1 week of doxycycline the area was still red and swollen and his pain medication had to be increased from hydrocodone to oxycodone.  He is a diabetic he is a smoker.  I decided to bring him in for IV antibiotics to try to prevent any need for reoperation  Past Medical History:  Diagnosis Date   Alcohol abuse    Anxiety    Back pain    CVA (cerebral infarction)    Depression    Diabetes mellitus    Hypertension    Migraines    Stroke [facility])     Past Surgical History:  Procedure Laterality Date   FOOT SURGERY     IR RADIOLOGIST EVAL & MGMT  08/23/2021   MANDIBLE SURGERY     QUADRICEPS TENDON REPAIR Right 09/04/2021   Procedure: REPAIR QUADRICEP TENDON;  Surgeon: Carole Civil, MD;  Location: [facility];  Service: Orthopedics;  Laterality: Right;    Family History  Problem Relation Age of Onset   Heart failure Mother    Stroke Father    Stroke Sister    Cancer Sister        breast cancer   Colon cancer Neg Hx    Liver cancer Neg Hx    Social History:  reports that he has been smoking cigarettes. He has a 40.00 pack-year smoking history. He has never used smokeless tobacco. He reports that he does not currently use alcohol. He reports current drug use. Drug: Marijuana.  Allergies:  Allergies  Allergen Reactions   Ultram [Tramadol] Hives   Benadryl [Diphenhydramine] Itching and Rash    Medications Prior to Admission  Medication Sig Dispense Refill   albuterol (VENTOLIN HFA) 108 (90 Base) MCG/ACT inhaler Inhale 2 puffs into the lungs every 6 (six) hours  as needed for wheezing or shortness of breath.     busPIRone (BUSPAR) 15 MG tablet Take 15 mg by mouth 2 (two) times daily.     Cholecalciferol (VITAMIN D-3) 125 MCG (5000 UT) TABS Take 5,000 Units by mouth daily.     citalopram (CELEXA) 20 MG tablet Take 20 mg by mouth daily.     gabapentin (NEURONTIN) 600 MG tablet Take 600 mg by mouth in the morning and at bedtime.     hydrOXYzine (ATARAX) 25 MG tablet Take 25 mg by mouth 3 (three) times daily as needed for anxiety.     meloxicam (MOBIC) 7.5 MG tablet Take 7.5 mg by mouth in the morning and at bedtime.     metFORMIN (GLUCOPHAGE) 500 MG tablet Take 1 tablet (500 mg total) by mouth 2 (two) times daily with a meal. 60 tablet 2   omeprazole (PRILOSEC) 40 MG capsule Take 1 capsule (40 mg total) by mouth daily before breakfast. 30 capsule 3   QUEtiapine (SEROQUEL) 100 MG tablet Take 100 mg by mouth at bedtime.     doxycycline (VIBRA-TABS) 100 MG tablet Take 1 tablet (100 mg total) by mouth 2 (two) times daily. (Patient not taking: Reported on 09/26/2021) 28 tablet 1    Results  for orders placed or performed during the hospital encounter of 09/26/21 (from the past 48 hour(s))  Resp Panel by RT-PCR (Flu A&B, Covid) Nasopharyngeal Swab     Status: None   Collection Time: 09/26/21 11:30 AM   Specimen: Nasopharyngeal Swab; Nasopharyngeal(NP) swabs in vial transport medium  Result Value Ref Range   SARS Coronavirus 2 by RT PCR NEGATIVE NEGATIVE    Comment: (NOTE) SARS-CoV-2 target nucleic acids are NOT DETECTED.  The SARS-CoV-2 RNA is generally detectable in upper respiratory specimens during the acute phase of infection. The lowest concentration of SARS-CoV-2 viral copies this assay can detect is 138 copies/mL. A negative result does not preclude SARS-Cov-2 infection and should not be used as the sole basis for treatment or other patient management decisions. A negative result may occur with  improper specimen collection/handling, submission of  specimen other than nasopharyngeal swab, presence of viral mutation(s) within the areas targeted by this assay, and inadequate number of viral copies(<138 copies/mL). A negative result must be combined with clinical observations, patient history, and epidemiological information. The expected result is Negative.  Fact Sheet for Patients:  EntrepreneurPulse.com.au  Fact Sheet for Healthcare Providers:  IncredibleEmployment.be  This test is no t yet approved or cleared by the [place] FDA and  has been authorized for detection and/or diagnosis of SARS-CoV-2 by FDA under an Emergency Use Authorization (EUA). This EUA will remain  in effect (meaning this test can be used) for the duration of the COVID-19 declaration under Section 564(b)(1) of the Act, 21 U.S.C.section 360bbb-3(b)(1), unless the authorization is terminated  or revoked sooner.       Influenza A by PCR NEGATIVE NEGATIVE   Influenza B by PCR NEGATIVE NEGATIVE    Comment: (NOTE) The Xpert Xpress SARS-CoV-2/FLU/RSV plus assay is intended as an aid in the diagnosis of influenza from Nasopharyngeal swab specimens and should not be used as a sole basis for treatment. Nasal washings and aspirates are unacceptable for Xpert Xpress SARS-CoV-2/FLU/RSV testing.  Fact Sheet for Patients: EntrepreneurPulse.com.au  Fact Sheet for Healthcare Providers: IncredibleEmployment.be  This test is not yet approved or cleared by the [place] FDA and has been authorized for detection and/or diagnosis of SARS-CoV-2 by FDA under an Emergency Use Authorization (EUA). This EUA will remain in effect (meaning this test can be used) for the duration of the COVID-19 declaration under Section 564(b)(1) of the Act, 21 U.S.C. section 360bbb-3(b)(1), unless the authorization is terminated or revoked.  Performed at  [facility] [address], [place], [place]  [address]   Glucose, capillary     Status: Abnormal   Collection Time: 09/26/21 11:31 AM  Result Value Ref Range   Glucose-Capillary 161 (H) 70 - 99 mg/dL    Comment: Glucose reference range applies only to samples taken after fasting for at least 8 hours.  CBC     Status: None   Collection Time: 09/26/21 12:39 PM  Result Value Ref Range   WBC 7.9 4.0 - 10.5 K/uL   RBC 5.21 4.22 - 5.81 MIL/uL   Hemoglobin 15.1 13.0 - 17.0 g/dL   HCT 45.5 39.0 - 52.0 %   MCV 87.3 80.0 - 100.0 fL   MCH 29.0 26.0 - 34.0 pg   MCHC 33.2 30.0 - 36.0 g/dL   RDW 13.4 11.5 - 15.5 %   Platelets 156 150 - 400 K/uL   nRBC 0.0 0.0 - 0.2 %    Comment: Performed at [facility], [address] [place],  [address]  No results found.  Review of Systems  He has not noticed any fevers or increase in his glucose requirements he has multiple joint pain occasional wheezing  Blood pressure 128/90, pulse (!) 106, temperature 98.4 F (36.9 C), temperature source Oral, resp. rate 18, height 5\' 10"  (1.778 m), weight 74.8 kg, SpO2 98 %. Physical Exam   General: Small frame normal body habitus normal grooming and hygiene  Mental status: Awake alert and oriented x3 mood and affect normal  Skin integrity: Cellulitis partial breakdown right knee wound proximal portion of the wound looks normal distal to midportion is where the erythema is and part of the wound is broken down lateral to the incision this area is tender  Right knee: Prior skin incision from surgery noted as above.  Knee is being kept in extension with a plan for 6 weeks of full extension although he can bend it about 45 degrees.  There is no instability in the knee.  There is weakness in the quadriceps.  Neurologically: Again mental status is normal.  No gross deficits in terms of sensory loss.  No proprioceptive loss detectable.  Cardiovascular: Normal pulses no edema no cyanosis  Respiratory: Normal expansion of the lungs no wheezing  GI: Abdomen  soft no masses  HEENT no abnormalities   Assessment/Plan Postop cellulitis right knee  Status post quadriceps tendon repair right knee  Diabetic  Chronic smoking and tobacco use  IV vancomycin White count is 7.9 glucose is 161 sed rate and C-reactive protein still pending  Arther Abbott, MD 09/26/2021, 1:09 PM

## 2021-09-26 NOTE — Progress Notes (Signed)
Patient ID: Danny [name], male   DOB: September 10, 1965, 56 y.o.   MRN: [id number]  56 year old male with diabetes who is a smoker had right quadriceps tendon repair on September 04, 2021 developed postop cellulitis was treated with Keflex unsuccessfully and then with doxycycline also unsuccessfully and is admitted for IV antibiotics with vancomycin for postop wound cellulitis

## 2021-09-26 NOTE — Progress Notes (Addendum)
Pharmacy Antibiotic Note  Danny [name] is a 56 y.o. male admitted on 09/26/2021 with  wound infection .  Pharmacy has been consulted for Vancomycin dosing. quadriceps tendon repair on September 04, 2021 developed postop cellulitis was treated with Keflex unsuccessfully and then with doxycycline also unsuccessfully and is admitted for IV antibiotics   Plan: Vancomycin 1500mg  IV loading dose then 1000 mg IV Q 12 hrs. Goal AUC 400-550. Expected AUC: 479 SCr used: 0.9  F/U cxs and clinical progress Monitor V/S, labs and levels as indicated  Height: 5\' 10"  (177.8 cm) Weight: 74.8 kg (165 lb) IBW/kg (Calculated) : 73  Temp (24hrs), Avg:98.4 F (36.9 C), Min:98.4 F (36.9 C), Max:98.4 F (36.9 C)  Recent Labs  Lab 09/24/21 1005  CREATININE 0.90    Estimated Creatinine Clearance: 95.8 mL/min (by C-G formula based on SCr of 0.9 mg/dL).    Allergies  Allergen Reactions   Ultram [Tramadol] Hives   Benadryl [Diphenhydramine] Itching and Rash    Antimicrobials this admission: Vancomycin  1/18 >>   Microbiology results: No cultures  Thank you for allowing pharmacy to be a part of this patients care.  Isac Sarna, BS [place], [place] Clinical Pharmacist Pager [phone] 09/26/2021 12:36 PM

## 2021-09-26 NOTE — Evaluation (Signed)
Physical Therapy Evaluation Patient Details Name: Danny [name] MRN: [id number] DOB: 12-01-1965 Today's Date: 09/26/2021  History of Present Illness  Danny [name] is an 56 y.o. male.    Chief Complaint: Pain erythema right knee  HPI: [name] is 56 years old he had repair of a partial quadriceps tendon rupture about 3 weeks ago postoperatively developed cellulitis around the mid to distal portion of his right knee wound and was treated with Keflex.  After 1 week the Keflex was changed to doxycycline due to failure to improve the situation regarding the incision.  After 1 week of doxycycline the area was still red and swollen and his pain medication had to be increased from hydrocodone to oxycodone.  He is a diabetic he is a smoker.  I decided to bring him in for IV antibiotics to try to prevent any need for reoperation   Clinical Impression  Patient presents with immobilizer to RLE and demonstrates good return for moving RLE during bed mobility, transfers, able to ambulate in hallway with slow slightly labored cadence using RW without loss of balance and limited mostly due to fatigue.  Patient demonstrates good return for transferring to/from [facility] and tolerated sitting up at bedside after therapy.  Patient will benefit from continued skilled physical therapy in hospital and recommended venue below to increase strength, balance, endurance for safe ADLs and gait.      Recommendations for follow up therapy are one component of a multi-disciplinary discharge planning process, led by the attending physician.  Recommendations may be updated based on patient status, additional functional criteria and insurance authorization.  Follow Up Recommendations Home health PT    Assistance Recommended at Discharge PRN  Patient can return home with the following  Help with stairs or ramp for entrance;Assistance with cooking/housework;A little help with bathing/dressing/bathroom;A little help with walking  and/or transfers    Equipment Recommendations BSC/3in1  Recommendations for Other Services       Functional Status Assessment Patient has had a recent decline in their functional status and demonstrates the ability to make significant improvements in function in a reasonable and predictable amount of time.     Precautions / Restrictions Precautions Precautions: Fall Required Braces or Orthoses: Knee Immobilizer - Right Knee Immobilizer - Right: On at all times Restrictions Weight Bearing Restrictions: No      Mobility  Bed Mobility Overal bed mobility: Modified Independent                  Transfers Overall transfer level: Needs assistance Equipment used: Rolling walker (2 wheels) Transfers: Sit to/from Stand, Bed to chair/wheelchair/BSC Sit to Stand: Supervision   Step pivot transfers: Supervision       General transfer comment: increased time, labored movement    Ambulation/Gait Ambulation/Gait assistance: Supervision Gait Distance (Feet): 50 Feet Assistive device: Rolling walker (2 wheels) Gait Pattern/deviations: Decreased step length - right, Decreased step length - left, Decreased stance time - right, Decreased stride length, Antalgic Gait velocity: decreased     General Gait Details: slow slightly labored cadence with good return for using RW while wearing RLE knee immobilizer without loss of balance  Stairs            Wheelchair Mobility    Modified Rankin (Stroke Patients Only)       Balance Overall balance assessment: Needs assistance Sitting-balance support: Feet supported, No upper extremity supported Sitting balance-Leahy Scale: Good Sitting balance - Comments: seated at EOB   Standing balance support: During  functional activity, Bilateral upper extremity supported Standing balance-Leahy Scale: Fair Standing balance comment: fair/good using RW, fair/poor without AD                             Pertinent  Vitals/Pain Pain Assessment Pain Assessment: 0-10 Pain Score: 8  Pain Location: right knee, shin Pain Descriptors / Indicators: Sore Pain Intervention(s): Limited activity within patient's tolerance, Monitored during session, Repositioned    Home Living Family/patient expects to be discharged to:: Private residence Living Arrangements: Other relatives Available Help at Discharge: Family;Available PRN/intermittently Type of Home: House Home Access: Stairs to enter Entrance Stairs-Rails: None Entrance Stairs-Number of Steps: 1   Home Layout: One level Home Equipment: Conservation officer, nature (2 wheels);Cane - single point;Grab bars - tub/shower      Prior Function Prior Level of Function : Needs assist       Physical Assist : Mobility (physical);ADLs (physical) Mobility (physical): Bed mobility;Transfers;Gait;Stairs   Mobility Comments: household ambulator using RW, does not drive ADLs Comments: assisted by family for household and community ADL's     Hand Dominance   Dominant Hand: Left    Extremity/Trunk Assessment   Upper Extremity Assessment Upper Extremity Assessment: Overall WFL for tasks assessed    Lower Extremity Assessment Lower Extremity Assessment: Generalized weakness;RLE deficits/detail RLE Deficits / Details: grossly 4-/5 RLE: Unable to fully assess due to immobilization RLE Sensation: WNL RLE Coordination: WNL    Cervical / Trunk Assessment Cervical / Trunk Assessment: Normal  Communication   Communication: No difficulties  Cognition Arousal/Alertness: Awake/alert Behavior During Therapy: WFL for tasks assessed/performed Overall Cognitive Status: Within Functional Limits for tasks assessed                                          General Comments      Exercises     Assessment/Plan    PT Assessment Patient needs continued PT services  PT Problem List Decreased strength;Decreased activity tolerance;Decreased balance;Decreased  mobility;Decreased range of motion       PT Treatment Interventions DME instruction;Gait training;Stair training;Functional mobility training;Therapeutic activities;Therapeutic exercise;Patient/family education;Balance training    PT Goals (Current goals can be found in the Care Plan section)  Acute Rehab PT Goals Patient Stated Goal: return home with family to assist PT Goal Formulation: With patient Time For Goal Achievement: 09/29/21 Potential to Achieve Goals: Good    Frequency Min 3X/week     Co-evaluation               AM-PAC PT "6 Clicks" Mobility  Outcome Measure Help needed turning from your back to your side while in a flat bed without using bedrails?: None Help needed moving from lying on your back to sitting on the side of a flat bed without using bedrails?: None Help needed moving to and from a bed to a chair (including a wheelchair)?: A Little Help needed standing up from a chair using your arms (e.g., wheelchair or bedside chair)?: None Help needed to walk in hospital room?: A Little Help needed climbing 3-5 steps with a railing? : A Lot 6 Click Score: 20    End of Session   Activity Tolerance: Patient tolerated treatment well;Patient limited by fatigue Patient left: in bed;with call bell/phone within reach Nurse Communication: Mobility status PT Visit Diagnosis: Unsteadiness on feet (R26.81);Other abnormalities of gait and mobility (R26.89);Muscle  weakness (generalized) (M62.81)    Time: 2952-8413 PT Time Calculation (min) (ACUTE ONLY): 21 min   Charges:   PT Evaluation $PT Eval Moderate Complexity: 1 Mod PT Treatments $Therapeutic Activity: 8-22 mins        3:52 PM, 09/26/21 Lonell Grandchild, MPT Physical Therapist with [facility] [phone] office [phone] mobile phone

## 2021-09-26 NOTE — Progress Notes (Signed)
FOLLOW UP   Encounter Diagnosis  Name Primary?   Rupture of right quadriceps tendon, subsequent encounter repair 09/04/21 Yes     Chief Complaint  Patient presents with   Post-op Problem    Incision check s/p repair right Quad tendon 09/04/21     55 YO MALE he is status post quadriceps tendon debridement of chronic tendinosis and repair of partial tearing of the quadriceps tendon approximately 22 days ago  He has a postop cellulitis.  He was on Keflex for a week and then doxycycline for a week with minimal improvement.  His pain medication had to be increased.  He is still smoking he is a diabetic  He is advised to be admitted to the hospital for IV antibiotics a PICC line management of his diabetes and smoking  We will get a bed for him today for admission.

## 2021-09-26 NOTE — Plan of Care (Signed)
°  Problem: Acute Rehab PT Goals(only PT should resolve) Goal: Pt Will Go Supine/Side To Sit Outcome: Progressing Flowsheets (Taken 09/26/2021 1554) Pt will go Supine/Side to Sit:  Independently  with modified independence Goal: Patient Will Transfer Sit To/From Stand Outcome: Progressing Flowsheets (Taken 09/26/2021 1554) Patient will transfer sit to/from stand: with modified independence Goal: Pt Will Transfer Bed To Chair/Chair To Bed Outcome: Progressing Flowsheets (Taken 09/26/2021 1554) Pt will Transfer Bed to Chair/Chair to Bed: with modified independence Goal: Pt Will Ambulate Outcome: Progressing Flowsheets (Taken 09/26/2021 1554) Pt will Ambulate:  100 feet  with modified independence  with rolling walker   3:55 PM, 09/26/21 Lonell Grandchild, MPT Physical Therapist with [facility] [phone] office [phone] mobile phone

## 2021-09-27 ENCOUNTER — Telehealth: Payer: Self-pay | Admitting: Radiology

## 2021-09-27 DIAGNOSIS — Y838 Other surgical procedures as the cause of abnormal reaction of the patient, or of later complication, without mention of misadventure at the time of the procedure: Secondary | ICD-10-CM | POA: Diagnosis present

## 2021-09-27 DIAGNOSIS — Z888 Allergy status to other drugs, medicaments and biological substances status: Secondary | ICD-10-CM | POA: Diagnosis not present

## 2021-09-27 DIAGNOSIS — F17210 Nicotine dependence, cigarettes, uncomplicated: Secondary | ICD-10-CM | POA: Diagnosis present

## 2021-09-27 DIAGNOSIS — Z20822 Contact with and (suspected) exposure to covid-19: Secondary | ICD-10-CM | POA: Diagnosis present

## 2021-09-27 DIAGNOSIS — T8149XA Infection following a procedure, other surgical site, initial encounter: Secondary | ICD-10-CM | POA: Diagnosis present

## 2021-09-27 DIAGNOSIS — Z79899 Other long term (current) drug therapy: Secondary | ICD-10-CM | POA: Diagnosis not present

## 2021-09-27 DIAGNOSIS — I10 Essential (primary) hypertension: Secondary | ICD-10-CM | POA: Diagnosis present

## 2021-09-27 DIAGNOSIS — F419 Anxiety disorder, unspecified: Secondary | ICD-10-CM | POA: Diagnosis present

## 2021-09-27 DIAGNOSIS — Z8673 Personal history of transient ischemic attack (TIA), and cerebral infarction without residual deficits: Secondary | ICD-10-CM | POA: Diagnosis not present

## 2021-09-27 DIAGNOSIS — F1010 Alcohol abuse, uncomplicated: Secondary | ICD-10-CM | POA: Diagnosis present

## 2021-09-27 DIAGNOSIS — E119 Type 2 diabetes mellitus without complications: Secondary | ICD-10-CM | POA: Diagnosis present

## 2021-09-27 DIAGNOSIS — F32A Depression, unspecified: Secondary | ICD-10-CM | POA: Diagnosis present

## 2021-09-27 DIAGNOSIS — Y818 Miscellaneous general- and plastic-surgery devices associated with adverse incidents, not elsewhere classified: Secondary | ICD-10-CM | POA: Diagnosis present

## 2021-09-27 DIAGNOSIS — Z7984 Long term (current) use of oral hypoglycemic drugs: Secondary | ICD-10-CM | POA: Diagnosis not present

## 2021-09-27 DIAGNOSIS — Z8249 Family history of ischemic heart disease and other diseases of the circulatory system: Secondary | ICD-10-CM | POA: Diagnosis not present

## 2021-09-27 DIAGNOSIS — L03115 Cellulitis of right lower limb: Secondary | ICD-10-CM | POA: Diagnosis present

## 2021-09-27 DIAGNOSIS — M9689 Other intraoperative and postprocedural complications and disorders of the musculoskeletal system: Secondary | ICD-10-CM | POA: Diagnosis present

## 2021-09-27 NOTE — Progress Notes (Signed)
Pt expresses 10/10 R knee pain q 2 hrs and requests Morphine for pain. States pain medication helps ease pain to approximately a 7-8.

## 2021-09-27 NOTE — Telephone Encounter (Signed)
Patient was to be admitted as inpatient but orders were for Obs.  I gave verbal to change to IP.  No further needed.

## 2021-09-27 NOTE — TOC Initial Note (Signed)
Transition of Care [facility]) - Initial/Assessment Note    Patient Details  Name: Danny [name] MRN: [id number] Date of Birth: 11-Jan-1966  Transition of Care [facility]) CM/SW Contact:    Iona Beard, [place] Phone Number: 09/27/2021, 10:41 AM  Clinical Narrative:                 TOC updated that pt is in need of HH and DME at d/c. CSW met with pt in room to complete assessment. Pt is from home and independent in completing his ADLs. Pt does not drive as he is legally blind however he has transportation as needed. Pt has not had HH in the past. CSW inquired about pts interest in [facility], pt states that he is independent in the home and does not feel he needs any assistance. Pt has a cane, walker, and crutches to use in the home when needed. CSW inquired about pts interest in a 3n1 as recommended by PT. Pt is interested. CSW gave referral to [place] with [employer] and she will work on getting it delivered for pt. TOC to follow.   Expected Discharge Plan: Home/Self Care Barriers to Discharge: Continued Medical Work up   Patient Goals and CMS Choice Patient states their goals for this hospitalization and ongoing recovery are:: Return home CMS Medicare.gov Compare Post Acute Care list provided to:: Patient Choice offered to / list presented to : Patient  Expected Discharge Plan and Services Expected Discharge Plan: Home/Self Care In-house Referral: Clinical Social Work Discharge Planning Services: CM Consult Post Acute Care Choice: Durable Medical Equipment Living arrangements for the past 2 months: Single Family Home                 DME Arranged: 3-N-1 DME Agency: [employer] Date DME Agency Contacted: 09/27/21   Representative spoke with at DME Agency: Caryl Pina            Prior Living Arrangements/Services Living arrangements for the past 2 months: [place] Lives with:: Self Patient language and need for interpreter reviewed:: Yes Do you feel safe going back to the place where you  live?: Yes      Need for Family Participation in Patient Care: Yes (Comment) Care giver support system in place?: Yes (comment) Current home services: DME Criminal Activity/Legal Involvement Pertinent to Current Situation/Hospitalization: No - Comment as needed  Activities of Daily Living Home Assistive Devices/Equipment: CBG Meter, Walker (specify type) ADL Screening (condition at time of admission) Patient's cognitive ability adequate to safely complete daily activities?: Yes Is the patient deaf or have difficulty hearing?: No Does the patient have difficulty seeing, even when wearing glasses/contacts?: No Does the patient have difficulty concentrating, remembering, or making decisions?: No Patient able to express need for assistance with ADLs?: Yes Does the patient have difficulty dressing or bathing?: No Independently performs ADLs?: Yes (appropriate for developmental age) Does the patient have difficulty walking or climbing stairs?: Yes Weakness of Legs: None Weakness of Arms/Hands: None  Permission Sought/Granted                  Emotional Assessment Appearance:: Appears stated age Attitude/Demeanor/Rapport: Engaged Affect (typically observed): Accepting Orientation: : Oriented to Place, Oriented to  Time, Oriented to Self Alcohol / Substance Use: Not Applicable Psych Involvement: No (comment)  Admission diagnosis:  Postoperative wound cellulitis [T81.49XA] Patient Active Problem List   Diagnosis Date Noted   Postoperative wound cellulitis 09/26/2021   Rupture of right quadriceps tendon    Chronic hepatitis C without  hepatic coma (HCC) 08/08/2021  ° Hepatocellular carcinoma (HCC) 08/08/2021  ° COPD (chronic obstructive pulmonary disease) (HCC) 08/08/2021  ° Hypertension 08/08/2021  ° Other cirrhosis of liver (HCC) 08/08/2021  ° Type 2 diabetes mellitus (HCC) 08/08/2021  ° Generalized abdominal pain 06/06/2021  ° Dysphagia 06/06/2021  ° Gastroesophageal reflux disease  06/06/2021  ° Abnormal liver ultrasound 06/06/2021  ° Colon cancer screening 06/06/2021  ° Elevated LFTs 06/06/2021  ° GREATER TROCHANTERIC BURSITIS 07/03/2007  ° INCREASED BLOOD PRESSURE 07/03/2007  ° BACK PAIN, LUMBAR 05/29/2007  ° NUMBNESS 05/29/2007  ° INSOMNIA, CHRONIC 05/13/2007  ° HYPERLIPIDEMIA 01/13/2007  ° ANXIETY DEPRESSION 01/13/2007  ° SYNDROME, CHRONIC PAIN 01/13/2007  ° CVA 01/13/2007  ° ARTHRITIS 01/13/2007  ° °PCP:  McCorkle, Tenika, FNP °Pharmacy:   °[id number] - [place], [place] - [address] °[address] °[place] [address] °Phone: [phone] Fax: [phone] ° °[id number] - [place], [place] - [address] [facility] °[address] °[place] [address] °Phone: [phone] Fax: [phone] ° ° ° ° °Social Determinants of Health (SDOH) Interventions °  ° °Readmission Risk Interventions °Readmission Risk Prevention Plan 09/27/2021  °Medication Screening Complete  °Transportation Screening Complete  °Some recent data might be hidden  ° ° ° °

## 2021-09-27 NOTE — Telephone Encounter (Signed)
Danny [name] called and wanted to know if patient can be made inpatient.  Currently in [facility].  You can call her with a verbal.

## 2021-09-28 NOTE — Progress Notes (Signed)
Patient request pain medication frequently. I spoke with him about his blood pressure and taking the amount of medication he request. However, he insisted on IV pain medication. Patient's leg was assessed and I did not  feel any warmth around dressing or see any swelling. Patient has kept leg elevated on pillow throughout the night.

## 2021-09-28 NOTE — Progress Notes (Signed)
Patient ID: Danny [name], male   DOB: 09-04-1966, 56 y.o.   MRN: [id number]  Date of admission 09/26/2021   Hospital day 3  BP 107/70 (BP Location: Left Arm)    Pulse 78    Temp 97.7 F (36.5 C) (Oral)    Resp 20    Ht 5\' 10"  (1.778 m)    Wt 74.8 kg    SpO2 96%    BMI 23.68 kg/m   Dresssing change no drainage   Wound continues to improve with decreasing area of cellulitis   Rec continue iv meds and dc am

## 2021-09-28 NOTE — Progress Notes (Addendum)
Patient ID: Danny [name], male   DOB: Apr 05, 1966, 56 y.o.   MRN: [id number] Progress note 12/08/2017 written on 120  Danny [name] is tolerating his vancomycin  A photo was taken of the wound and compared to the photo taken in the office and he has definitely improved in terms of the erythema around his knee  He says his knee definitely feels better than it did prior to surgery although he still having quite a bit of pain requiring oxycodone  Our plan is to continue his IV antibiotics  His initial sed rate was 3 CRP was 0.5 white count 7 total protein 7.2 he did have elevation of his AST ALT at 63 and 49 respectively

## 2021-09-28 NOTE — Progress Notes (Signed)
Pt is requesting Morphine every hour, he is only getting it as ordered q 2 and sometimes when he asks for it he is barely awake. Through the night his b/p was decreased and he was educated about the risk of taking morphine so often. MD Aline Brochure made aware.

## 2021-09-29 MED ORDER — DOXYCYCLINE HYCLATE 100 MG PO TABS: 100.0000 mg | ORAL_TABLET | Freq: Two times a day (BID) | ORAL | 1 refills | Status: AC

## 2021-09-29 MED ORDER — AMOXICILLIN-POT CLAVULANATE 875-125 MG PO TABS: 1.0000 | ORAL_TABLET | Freq: Two times a day (BID) | ORAL | 0 refills | Status: AC

## 2021-09-29 MED ORDER — OXYCODONE HCL 5 MG PO TABS: 5.0000 mg | ORAL_TABLET | ORAL | 0 refills | Status: DC | PRN

## 2021-09-29 NOTE — Discharge Instructions (Signed)
Wear brace when walking   You can remove it for bathing and taking a shower

## 2021-09-29 NOTE — Discharge Summary (Signed)
Physician Discharge Summary  Patient ID: JOANNE BRANDER MRN: [id number] DOB/AGE: July 16, 1966 56 y.o.  Admit date: 09/26/2021 Discharge date: 09/29/2021  Admission Diagnoses: post op cellulitis right knee (s/p quadriceps tendon repair)  Discharge Diagnoses: same   Discharged Condition: stable  Procedure: none   Hospital Course:  I admitted him on 09/26/21, I started him on vancomycin. See pictures of the wound in media. The area od cellulitis deceresed. His initial wbc, esr, and crp were normal   His pain decreased        Discharge Exam: BP 135/79 (BP Location: Left Arm)    Pulse 80    Temp 98 F (36.7 C) (Oral)    Resp 17    Ht $R'5\' 10"'Ij$  (1.778 m)    Wt 74.8 kg    SpO2 97%    BMI 23.68 kg/m  Physical Exam Constitutional:      General: He is not in acute distress.    Appearance: Normal appearance. He is not toxic-appearing.  HENT:     Head: Normocephalic and atraumatic.  Eyes:     General: No scleral icterus.    Extraocular Movements: Extraocular movements intact.     Conjunctiva/sclera: Conjunctivae normal.     Pupils: Pupils are equal, round, and reactive to light.  Cardiovascular:     Rate and Rhythm: Normal rate.  Pulmonary:     Effort: Pulmonary effort is normal.  Abdominal:     General: Abdomen is flat.     Palpations: Abdomen is soft.  Musculoskeletal:     Cervical back: No rigidity.  Skin:    General: Skin is warm.     Findings: Erythema present.  Neurological:     General: No focal deficit present.     Mental Status: He is alert and oriented to person, place, and time. Mental status is at baseline.  Psychiatric:        Mood and Affect: Mood normal.        Behavior: Behavior normal.        Thought Content: Thought content normal.        Judgment: Judgment normal.    See media for wound pics  Rt knee rom flexion 65, active ext to 10    Disposition: Discharge disposition: 01-Home or Self Care       Discharge Instructions     Call MD / Call  911   Complete by: As directed    If you experience chest pain or shortness of breath, CALL 911 and be transported to the hospital emergency room.  If you develope a fever above 101 F, pus (white drainage) or increased drainage or redness at the wound, or calf pain, call your surgeon's office.   Constipation Prevention   Complete by: As directed    Drink plenty of fluids.  Prune juice may be helpful.  You may use a stool softener, such as Colace (over the counter) 100 mg twice a day.  Use MiraLax (over the counter) for constipation as needed.   Diet - low sodium heart healthy   Complete by: As directed    Discharge instructions   Complete by: As directed    Wear brace when walking   You can remove it for bathing and taking a shower   Increase activity slowly as tolerated   Complete by: As directed    Post-operative opioid taper instructions:   Complete by: As directed    POST-OPERATIVE OPIOID TAPER INSTRUCTIONS: It is important to wean off of your  opioid medication as soon as possible. If you do not need pain medication after your surgery it is ok to stop day one. Opioids include: Codeine, Hydrocodone(Norco, Vicodin), Oxycodone(Percocet, oxycontin) and hydromorphone amongst others.  Long term and even short term use of opiods can cause: Increased pain response Dependence Constipation Depression Respiratory depression And more.  Withdrawal symptoms can include Flu like symptoms Nausea, vomiting And more Techniques to manage these symptoms Hydrate well Eat regular healthy meals Stay active Use relaxation techniques(deep breathing, meditating, yoga) Do Not substitute Alcohol to help with tapering If you have been on opioids for less than two weeks and do not have pain than it is ok to stop all together.  Plan to wean off of opioids This plan should start within one week post op of your joint replacement. Maintain the same interval or time between taking each dose and first  decrease the dose.  Cut the total daily intake of opioids by one tablet each day Next start to increase the time between doses. The last dose that should be eliminated is the evening dose.         Allergies as of 09/29/2021       Reactions   Ultram [tramadol] Hives   Benadryl [diphenhydramine] Itching, Rash        Medication List     TAKE these medications    albuterol 108 (90 Base) MCG/ACT inhaler Commonly known as: VENTOLIN HFA Inhale 2 puffs into the lungs every 6 (six) hours as needed for wheezing or shortness of breath.   amoxicillin-clavulanate 875-125 MG tablet Commonly known as: Augmentin Take 1 tablet by mouth 2 (two) times daily for 28 days.   busPIRone 15 MG tablet Commonly known as: BUSPAR Take 15 mg by mouth 2 (two) times daily.   citalopram 20 MG tablet Commonly known as: CELEXA Take 20 mg by mouth daily.   doxycycline 100 MG tablet Commonly known as: VIBRA-TABS Take 1 tablet (100 mg total) by mouth 2 (two) times daily for 28 days.   gabapentin 600 MG tablet Commonly known as: NEURONTIN Take 600 mg by mouth in the morning and at bedtime.   hydrOXYzine 25 MG tablet Commonly known as: ATARAX Take 25 mg by mouth 3 (three) times daily as needed for anxiety.   meloxicam 7.5 MG tablet Commonly known as: MOBIC Take 7.5 mg by mouth in the morning and at bedtime.   metFORMIN 500 MG tablet Commonly known as: GLUCOPHAGE Take 1 tablet (500 mg total) by mouth 2 (two) times daily with a meal.   omeprazole 40 MG capsule Commonly known as: PRILOSEC Take 1 capsule (40 mg total) by mouth daily before breakfast.   oxyCODONE 5 MG immediate release tablet Commonly known as: Oxy IR/ROXICODONE Take 1 tablet (5 mg total) by mouth every 4 (four) hours as needed for up to 5 days for moderate pain.   QUEtiapine 100 MG tablet Commonly known as: SEROQUEL Take 100 mg by mouth at bedtime.   Vitamin D-3 125 MCG (5000 UT) Tabs Take 5,000 Units by mouth daily.                Durable Medical Equipment  (From admission, onward)           Start     Ordered   09/28/21 1150  For home use only DME Bedside commode  Once       Comments: Quad tendon repair knee has to stay in extension  Question:  Patient needs a bedside  commode to treat with the following condition  Answer:  Disorder of tendon repair   09/28/21 1149   09/27/21 1039  For home use only DME 3 n 1  Once        09/27/21 1038            Follow-up Information     Carole Civil, MD Follow up on 10/03/2021.   Specialties: Orthopedic Surgery, Radiology Contact information: [address] [place] [address] [phone]                 Signed: Arther Abbott 09/29/2021, 10:48 AM

## 2021-10-03 ENCOUNTER — Telehealth: Payer: Self-pay | Admitting: Orthopedic Surgery

## 2021-10-03 ENCOUNTER — Ambulatory Visit (INDEPENDENT_AMBULATORY_CARE_PROVIDER_SITE_OTHER): Payer: Medicaid Other | Admitting: Orthopedic Surgery

## 2021-10-03 ENCOUNTER — Other Ambulatory Visit: Payer: Self-pay | Admitting: Orthopedic Surgery

## 2021-10-03 ENCOUNTER — Other Ambulatory Visit: Payer: Self-pay

## 2021-10-03 DIAGNOSIS — S76111D Strain of right quadriceps muscle, fascia and tendon, subsequent encounter: Secondary | ICD-10-CM

## 2021-10-03 MED ORDER — OXYCODONE HCL 5 MG PO CAPS: 5.0000 mg | ORAL_CAPSULE | ORAL | 0 refills | Status: DC | PRN

## 2021-10-03 MED ORDER — OXYCODONE HCL 5 MG PO TABS: 5.0000 mg | ORAL_TABLET | ORAL | 0 refills | Status: DC | PRN

## 2021-10-03 NOTE — Progress Notes (Signed)
Chief Complaint  Patient presents with   Routine Post Op    Pt Admitted to [facility] 09/26/21 due to wound infection DOS 09/04/21 RT quad tendon repair   56 year old male admitted on September 26, 2021 for IV vancomycin discharged on the 21st he had normal white count sed rate C-reactive protein and was discharged on Augmentin and doxycycline  See pictures of wound  Patient will have playmaker brace at 0-60 he can perform active range of motion weight-bear as tolerated  Oxycodone refilled as well  Meds ordered this encounter  Medications   oxyCODONE (OXY IR/ROXICODONE) 5 MG immediate release tablet    Sig: Take 1 tablet (5 mg total) by mouth every 4 (four) hours as needed for up to 5 days for moderate pain.    Dispense:  30 tablet    Refill:  0

## 2021-10-03 NOTE — Progress Notes (Signed)
Meds ordered this encounter  Medications   oxycodone (OXY-IR) 5 MG capsule    Sig: Take 1 capsule (5 mg total) by mouth every 4 (four) hours as needed for up to 5 days.    Dispense:  30 capsule    Refill:  0

## 2021-10-03 NOTE — Telephone Encounter (Signed)
Called back to patient to notify.

## 2021-10-03 NOTE — Telephone Encounter (Signed)
Patient and niece Danny [name], patient's contact, called to relay that [facility] in [place] advised that they call to ask Dr Aline Brochure to please change, if possible, to capsules, as they have no tablets in stock of the oxyCODONE (OXY IR/ROXICODONE)

## 2021-10-03 NOTE — Telephone Encounter (Signed)
I just completed a prior authorization for the capsules on cover my meds

## 2021-10-03 NOTE — Telephone Encounter (Signed)
Voice message received 4:30pm from pharmacist at [facility] asking [place] to call back as soon as possible regarding this patient. Ph# [phone]

## 2021-10-03 NOTE — Telephone Encounter (Signed)
I called but when I pressed 2 for pharmacy phone beeped loudly like it transferred me to a fax??? I have already sent message to Dr Aline Brochure regarding changing to Capsule, but he was in surgery this afternoon

## 2021-10-03 NOTE — Telephone Encounter (Signed)
Call received from [facility] at patient's primary care provider, [facility]; requests call from practice administrator, as states has questions about how our office does coding and billing. Please call [phone].

## 2021-10-04 ENCOUNTER — Telehealth: Payer: Self-pay | Admitting: Orthopedic Surgery

## 2021-10-04 ENCOUNTER — Other Ambulatory Visit: Payer: Self-pay | Admitting: Orthopedic Surgery

## 2021-10-04 MED ORDER — OXYCODONE HCL 5 MG PO TABS: 5.0000 mg | ORAL_TABLET | Freq: Four times a day (QID) | ORAL | 0 refills | Status: DC | PRN

## 2021-10-04 NOTE — Progress Notes (Signed)
.  [facility] Meds ordered this encounter  Medications   oxyCODONE (OXY IR/ROXICODONE) 5 MG immediate release tablet    Sig: Take 1 tablet (5 mg total) by mouth every 6 (six) hours as needed for up to 3 days for severe pain.    Dispense:  12 tablet    Refill:  0

## 2021-10-04 NOTE — Telephone Encounter (Signed)
Patient and daughter/designated contact left a voice message relaying that Danny [name] did not have the prescription *per previous phone note, Amy had forwarded request for capsules to Dr Aline Brochure, and also did the prior authorization request for medication  oxycodone (OXY-IR) 5 MG capsule 30 capsule   - patient therefore asked if he may change to [facility], please advise.

## 2021-10-09 ENCOUNTER — Other Ambulatory Visit: Payer: Self-pay | Admitting: Radiology

## 2021-10-09 ENCOUNTER — Other Ambulatory Visit: Payer: Self-pay | Admitting: Student

## 2021-10-09 NOTE — H&P (Signed)
Chief Complaint: Patient was seen in consultation today for hepatocellular carcinoma   Referring Physician(s): [place] Physician: Ruthann Cancer  Patient Status: [place]  History of Present Illness: Danny [name] is a 56 y.o. male with a medical history significant for alcohol use, anxiety/depression, DM, HTN, right quadricep tendon tear (surgery 09/04/21) and hepatocellular carcinoma. He developed right upper quadrant pain Spring 2022 and a RUQ [place] 01/06/21 revealed a 1.4 cm hyperechoic mass. Follow up imaging was recommended and on 06/13/21 the patient underwent CT imaging which was positive for two enhancing lesions in the liver concerning for hepatocellular carcinoma. Additional imaging was ordered.  MR Liver 06/29/21 IMPRESSION: 1. There is an arterially hyperenhancing mass of the central posterior left lobe of the liver, hepatic segment II/III, measuring 2.7 x 2.1 cm, with some evidence of washout and capsular enhancement. LI-RADS category 5, consistent with hepatocellular carcinoma. 2. Additional subtle arterially hyperenhancing lesion of the central liver dome, hepatic segment VIII, measuring 1.1 cm, without evidence of washout or capsule. LI-RADS category 3, intermediate suspicion for hepatocellular carcinoma. Attention on follow-up. 3. A previously reported hyperenhancing lesion of the inferior margin of the left lobe of the liver, hepatic segment III, is not clearly appreciated by MR. Attention on follow-up to this vicinity. 4. No evidence of lymphadenopathy or metastatic disease in the abdomen. 5. 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  He was referred to Interventional Radiology for further evaluation and met with Dr. Serafina Royals 08/23/21 via tele-visit. Various treatment options were discussed including chemotherapy, surgery and liver-directed therapy. Given the locations of the left-sided  masses, liver-directed therapy with radioembolization was determined to be the best treatment choice and the patient was in agreement. The patient is scheduled today for a hepatic angiogram with Tc-MAA administration with plans for Y-90 radioembolization in approximately two weeks.   Past Medical History:  Diagnosis Date   Alcohol abuse    Anxiety    Back pain    CVA (cerebral infarction)    Depression    Diabetes mellitus    Hypertension    Migraines    Stroke [facility])     Past Surgical History:  Procedure Laterality Date   FOOT SURGERY     IR RADIOLOGIST EVAL & MGMT  08/23/2021   MANDIBLE SURGERY     QUADRICEPS TENDON REPAIR Right 09/04/2021   Procedure: REPAIR QUADRICEP TENDON;  Surgeon: Carole Civil, MD;  Location: [facility];  Service: Orthopedics;  Laterality: Right;    Allergies: Ultram [tramadol] and Benadryl [diphenhydramine]  Medications: Prior to Admission medications   Medication Sig Start Date End Date Taking? Authorizing Provider  albuterol (VENTOLIN HFA) 108 (90 Base) MCG/ACT inhaler Inhale 2 puffs into the lungs every 6 (six) hours as needed for wheezing or shortness of breath.    [provider]  amoxicillin-clavulanate (AUGMENTIN) 875-125 MG tablet Take 1 tablet by mouth 2 (two) times daily for 28 days. 09/29/21 10/27/21  Carole Civil, MD  busPIRone (BUSPAR) 15 MG tablet Take 15 mg by mouth 2 (two) times daily.    [provider]  Cholecalciferol (VITAMIN D-3) 125 MCG (5000 UT) TABS Take 5,000 Units by mouth daily.    [provider]  citalopram (CELEXA) 20 MG tablet Take 20 mg by mouth daily.    [provider]  doxycycline (VIBRA-TABS) 100 MG tablet Take 1 tablet (100 mg total) by mouth 2 (two) times daily for 28 days. 09/29/21 10/27/21  Aline Brochure,  Tim Lair, MD  gabapentin (NEURONTIN) 600 MG tablet Take 600 mg by mouth in the morning and at bedtime. 06/24/21   [provider]  hydrOXYzine (ATARAX) 25 MG tablet  Take 25 mg by mouth 3 (three) times daily as needed for anxiety.    [provider]  meloxicam (MOBIC) 7.5 MG tablet Take 7.5 mg by mouth in the morning and at bedtime.    [provider]  metFORMIN (GLUCOPHAGE) 500 MG tablet Take 1 tablet (500 mg total) by mouth 2 (two) times daily with a meal. 01/01/18   Julianne Rice, MD  omeprazole (PRILOSEC) 40 MG capsule Take 1 capsule (40 mg total) by mouth daily before breakfast. 06/06/21   Erenest Rasher, PA-C  QUEtiapine (SEROQUEL) 100 MG tablet Take 100 mg by mouth at bedtime.    [provider]     Family History  Problem Relation Age of Onset   Heart failure Mother    Stroke Father    Stroke Sister    Cancer Sister        breast cancer   Colon cancer Neg Hx    Liver cancer Neg Hx     Social History   Socioeconomic History   Marital status: Legally Separated    Spouse name: Not on file   Number of children: Not on file   Years of education: Not on file   Highest education level: Not on file  Occupational History   Not on file  Tobacco Use   Smoking status: Every Day    Packs/day: 2.00    Years: 20.00    Pack years: 40.00    Types: Cigarettes   Smokeless tobacco: Never  Vaping Use   Vaping Use: Never used  Substance and Sexual Activity   Alcohol use: Not Currently    Comment: Quit 11 months ago. Used to drink about 6 pack a day. (documented 06/06/21)   Drug use: Yes    Types: Marijuana    Comment: occas   Sexual activity: Yes    Birth control/protection: None  Other Topics Concern   Not on file  Social History Narrative   Not on file   Social Determinants of Health   Financial Resource Strain: Not on file  Food Insecurity: Not on file  Transportation Needs: Not on file  Physical Activity: Not on file  Stress: Not on file  Social Connections: Not on file    Review of Systems: A 12 point ROS discussed and pertinent positives are indicated in the HPI above.  All other systems are  negative.  Review of Systems  Constitutional:  Negative for appetite change and fatigue.  Respiratory:  Negative for cough and shortness of breath.   Cardiovascular:  Negative for chest pain and leg swelling.  Gastrointestinal:  Positive for abdominal pain. Negative for diarrhea, nausea and vomiting.  Musculoskeletal:        Right leg pain   Neurological:  Negative for dizziness and headaches.   Vital Signs: BP (!) 119/48    Pulse 92    Temp 98.8 F (37.1 C) (Oral)    Resp 18    Ht _0  (1.778 m)    Wt 165 lb (74.8 kg)    SpO2 98%    BMI 23.68 kg/m   Physical Exam Constitutional:      General: He is not in acute distress.    Appearance: He is not ill-appearing.  HENT:     Mouth/Throat:     Mouth:  Mucous membranes are moist.     Pharynx: Oropharynx is clear.  Cardiovascular:     Rate and Rhythm: Normal rate and regular rhythm.  Pulmonary:     Effort: Pulmonary effort is normal.     Breath sounds: Normal breath sounds.  Abdominal:     General: Bowel sounds are normal.     Palpations: Abdomen is soft.     Tenderness: There is abdominal tenderness.  Musculoskeletal:        General: Signs of injury present.     Right lower leg: No edema.     Left lower leg: No edema.     Comments: Right leg pain from injury/recent surgery. Knee brace in place.   Skin:    General: Skin is warm and dry.  Neurological:     Mental Status: He is alert and oriented to person, place, and time.    Imaging: CT CHEST W CONTRAST  Result Date: 09/24/2021 CLINICAL DATA:  History of alcoholic cirrhosis with recent imaging diagnosis of multifocal hepatocellular carcinoma. Right upper quadrant pain x1 year. EXAM: CT CHEST WITH CONTRAST TECHNIQUE: Multidetector CT imaging of the chest was performed during intravenous contrast administration. RADIATION DOSE REDUCTION: This exam was performed according to the departmental dose-optimization program which includes automated exposure control, adjustment of the  mA and/or kV according to patient size and/or use of iterative reconstruction technique. CONTRAST:  82m OMNIPAQUE IOHEXOL 300 MG/ML  SOLN COMPARISON:  MRI June 29, 2021 and CT June 13, 2021 and CT abdomen pelvis February 17, 2019 FINDINGS: Cardiovascular: Aortic atherosclerosis without aneurysmal dilation. No central pulmonary embolus on this nondedicated study. Three-vessel coronary artery calcifications. Normal size heart. No significant pericardial effusion/thickening. Mediastinum/Nodes: No supraclavicular adenopathy. No discrete thyroid nodule. No pathologically enlarged mediastinal, hilar or axillary lymph nodes. Trachea and esophagus are unremarkable. Lungs/Pleura: Mild biapical pleuroparenchymal scarring. Mild paraseptal emphysema. 3 mm right lower lobe pulmonary nodule on image 97/4 is stable dating back to at least February 17, 2019, consistent with a benign etiology. No suspicious pulmonary nodules or masses. No pleural effusion. No pneumothorax. Upper Abdomen: Cirrhotic hepatic morphology. LI-RADS category 5 hepatic segment II/III lesion better evaluated on prior MRI, but appears similar prior. No new suspicious hepatic lesions identified. Musculoskeletal: Multilevel degenerative changes spine. No aggressive lytic or blastic lesion of bone. IMPRESSION: 1. No acute findings in the chest. 2. No suspicious pulmonary nodules or masses. 3. Three-vessel coronary artery calcifications. 4. Cirrhotic hepatic morphology. Similar appearance of the hepatic segment II/III lesion previously characterized as a LI-RADS category 5 on prior MRI. 5. Aortic Atherosclerosis (ICD10-I70.0) and Emphysema (ICD10-J43.9). Electronically Signed   By: JDahlia BailiffM.D.   On: 09/24/2021 12:00    Labs:  CBC: Recent Labs    06/11/21 0907 07/10/21 1947 09/26/21 1239 10/11/21 0743  WBC 5.7 7.4 7.9 5.9  HGB 15.9 14.6 15.1 14.6  HCT 47.7 43.1 45.5 42.1  PLT 112* 151 156 113*    COAGS: Recent Labs    06/11/21 0907  10/11/21 0743  INR 1.1 1.1    BMP: Recent Labs    06/11/21 0907 07/10/21 1947 09/24/21 1005 09/26/21 1239 10/11/21 0743  NA 134* 132*  --  139 135  K 4.0 3.7  --  4.1 4.1  CL 101 99  --  104 106  CO2 22 26  --  24 23  GLUCOSE 398* 140*  --  115* 166*  BUN 10 18  --  13 14  CALCIUM 9.2 8.7*  --  9.5 9.1  CREATININE 0.83 0.80 0.90 0.85 0.78  GFRNONAA  --  >60  --  >60 >60    LIVER FUNCTION TESTS: Recent Labs    06/11/21 0907 09/26/21 1239 10/11/21 0743  BILITOT 0.8 0.9 0.6  AST 61* 63* 41  ALT 71* 49* 46*  ALKPHOS  --  105 116  PROT 7.1 7.2 6.8  ALBUMIN  --  3.6 3.4*    TUMOR MARKERS: Recent Labs    06/11/21 0907  AFPTM 12.4*    Assessment and Plan:  Hepatocellular carcinoma: Danny [name], 56 year old male, presents today to the [facility] department for an image-guided hepatic angiogram with Tc-MAA administration.   Risks and benefits discussed with the patient including, but not limited to bleeding, infection, vascular injury, post procedural pain, nausea, vomiting and fatigue, contrast induced renal failure, liver failure, radiation injury to the bowel, radiation induced cholecystitis, neutropenia and possible need for additional procedures.  All of the patient's questions were answered, patient is agreeable to proceed. He has been NPO. Labs and vitals have been reviewed. AFP lab ordered.   Consent signed and in chart.   Thank you for this interesting consult.  I greatly enjoyed meeting Danny [name] and look forward to participating in their care.  A copy of this report was sent to the requesting provider on this date.  Electronically Signed: Soyla Dryer, AGACNP-BC [phone] 10/11/2021, 8:40 AM   I spent a total of  30 Minutes   in face to face in clinical consultation, greater than 50% of which was counseling/coordinating care for hepatic angiogram with Tc-MAA administration.

## 2021-10-09 NOTE — Telephone Encounter (Signed)
I called, NA, will try to call again later.

## 2021-10-10 ENCOUNTER — Other Ambulatory Visit: Payer: Self-pay | Admitting: Radiology

## 2021-10-11 ENCOUNTER — Other Ambulatory Visit (HOSPITAL_COMMUNITY): Payer: Self-pay | Admitting: Interventional Radiology

## 2021-10-11 ENCOUNTER — Encounter (HOSPITAL_COMMUNITY)
Admission: RE | Admit: 2021-10-11 | Discharge: 2021-10-11 | Disposition: A | Discharge status: Home / Self Care | Payer: Medicaid Other | Source: Ambulatory Visit | Attending: Interventional Radiology | Admitting: Interventional Radiology

## 2021-10-11 ENCOUNTER — Ambulatory Visit (HOSPITAL_COMMUNITY)
Admission: RE | Admit: 2021-10-11 | Discharge: 2021-10-11 | Disposition: A | Discharge status: Home / Self Care | Payer: Medicaid Other | Source: Ambulatory Visit | Attending: Interventional Radiology | Admitting: Interventional Radiology

## 2021-10-11 ENCOUNTER — Other Ambulatory Visit: Payer: Self-pay

## 2021-10-11 ENCOUNTER — Encounter (HOSPITAL_COMMUNITY): Payer: Self-pay

## 2021-10-11 DIAGNOSIS — C220 Liver cell carcinoma: Secondary | ICD-10-CM | POA: Insufficient documentation

## 2021-10-11 DIAGNOSIS — K7030 Alcoholic cirrhosis of liver without ascites: Secondary | ICD-10-CM | POA: Insufficient documentation

## 2021-10-11 DIAGNOSIS — F32A Depression, unspecified: Secondary | ICD-10-CM | POA: Diagnosis not present

## 2021-10-11 DIAGNOSIS — E119 Type 2 diabetes mellitus without complications: Secondary | ICD-10-CM | POA: Diagnosis not present

## 2021-10-11 DIAGNOSIS — F419 Anxiety disorder, unspecified: Secondary | ICD-10-CM | POA: Diagnosis not present

## 2021-10-11 DIAGNOSIS — I10 Essential (primary) hypertension: Secondary | ICD-10-CM | POA: Insufficient documentation

## 2021-10-11 DIAGNOSIS — F17210 Nicotine dependence, cigarettes, uncomplicated: Secondary | ICD-10-CM | POA: Diagnosis not present

## 2021-10-11 HISTORY — PX: IR ANGIOGRAM SELECTIVE EACH ADDITIONAL VESSEL: IMG667

## 2021-10-11 HISTORY — PX: IR US GUIDE VASC ACCESS RIGHT: IMG2390

## 2021-10-11 HISTORY — PX: IR ANGIOGRAM VISCERAL SELECTIVE: IMG657

## 2021-10-11 HISTORY — PX: IR 3D INDEPENDENT WKST: IMG2385

## 2021-10-11 LAB — COMPREHENSIVE METABOLIC PANEL
ALT: 46 U/L — ABNORMAL HIGH (ref 0–44)
AST: 41 U/L (ref 15–41)
Albumin: 3.4 g/dL — ABNORMAL LOW (ref 3.5–5.0)
Alkaline Phosphatase: 116 U/L (ref 38–126)
Anion gap: 6 (ref 5–15)
BUN: 14 mg/dL (ref 6–20)
CO2: 23 mmol/L (ref 22–32)
Calcium: 9.1 mg/dL (ref 8.9–10.3)
Chloride: 106 mmol/L (ref 98–111)
Creatinine, Ser: 0.78 mg/dL (ref 0.61–1.24)
GFR, Estimated: >60 mL/min (ref >60)
Glucose, Bld: 166 mg/dL — ABNORMAL HIGH (ref 70–99)
Potassium: 4.1 mmol/L (ref 3.5–5.1)
Sodium: 135 mmol/L (ref 135–145)
Total Bilirubin: 0.6 mg/dL (ref 0.3–1.2)
Total Protein: 6.8 g/dL (ref 6.5–8.1)

## 2021-10-11 LAB — CBC WITH DIFFERENTIAL/PLATELET
Abs Immature Granulocytes: 0.01 10*3/uL (ref 0.00–0.07)
Basophils Absolute: 0.1 10*3/uL (ref 0.0–0.1)
Basophils Relative: 1 %
Eosinophils Absolute: 0.2 10*3/uL (ref 0.0–0.5)
Eosinophils Relative: 3 %
HCT: 42.1 % (ref 39.0–52.0)
Hemoglobin: 14.6 g/dL (ref 13.0–17.0)
Immature Granulocytes: 0 %
Lymphocytes Relative: 37 %
Lymphs Abs: 2.1 10*3/uL (ref 0.7–4.0)
MCH: 30.2 pg (ref 26.0–34.0)
MCHC: 34.7 g/dL (ref 30.0–36.0)
MCV: 87 fL (ref 80.0–100.0)
Monocytes Absolute: 0.4 10*3/uL (ref 0.1–1.0)
Monocytes Relative: 7 %
Neutro Abs: 3.1 10*3/uL (ref 1.7–7.7)
Neutrophils Relative %: 52 %
Platelets: 113 10*3/uL — ABNORMAL LOW (ref 150–400)
RBC: 4.84 MIL/uL (ref 4.22–5.81)
RDW: 13.8 % (ref 11.5–15.5)
WBC: 5.9 10*3/uL (ref 4.0–10.5)
nRBC: 0 % (ref 0.0–0.2)

## 2021-10-11 LAB — PROTIME-INR
INR: 1.1 (ref 0.8–1.2)
Prothrombin Time: 14.3 seconds (ref 11.4–15.2)

## 2021-10-11 LAB — GLUCOSE, CAPILLARY: Glucose-Capillary: 133 mg/dL — ABNORMAL HIGH (ref 70–99)

## 2021-10-11 MED ORDER — FENTANYL CITRATE (PF) 100 MCG/2ML IJ SOLN
INTRAMUSCULAR | Status: AC
  Filled 2021-10-11: qty 2

## 2021-10-11 MED ORDER — SODIUM CHLORIDE 0.9 % IV SOLN: INTRAVENOUS | Status: DC

## 2021-10-11 MED ORDER — MIDAZOLAM HCL 2 MG/2ML IJ SOLN
INTRAMUSCULAR | Status: AC | PRN
  Administered 2021-10-11: .5 mg via INTRAVENOUS

## 2021-10-11 MED ORDER — FENTANYL CITRATE (PF) 100 MCG/2ML IJ SOLN
INTRAMUSCULAR | Status: AC | PRN
Start: 2021-10-11 — End: 2021-10-11

## 2021-10-11 MED ORDER — FENTANYL CITRATE (PF) 100 MCG/2ML IJ SOLN
INTRAMUSCULAR | Status: AC | PRN
Start: 2021-10-11 — End: 2021-10-11
  Administered 2021-10-11: 50 ug via INTRAVENOUS

## 2021-10-11 MED ORDER — FENTANYL CITRATE (PF) 100 MCG/2ML IJ SOLN
INTRAMUSCULAR | Status: AC | PRN
  Administered 2021-10-11: 50 ug via INTRAVENOUS

## 2021-10-11 MED ORDER — IOHEXOL 300 MG/ML  SOLN
100.0000 mL | Freq: Once | INTRAMUSCULAR | Status: AC | PRN
  Administered 2021-10-11: 100 mL via INTRA_ARTERIAL

## 2021-10-11 MED ORDER — TECHNETIUM TO 99M ALBUMIN AGGREGATED
4.0000 | Freq: Once | INTRAVENOUS | Status: AC
  Administered 2021-10-11: 4 via INTRAVENOUS

## 2021-10-11 MED ORDER — MIDAZOLAM HCL 2 MG/2ML IJ SOLN
INTRAMUSCULAR | Status: AC
  Filled 2021-10-11: qty 6

## 2021-10-11 MED ORDER — MIDAZOLAM HCL 2 MG/2ML IJ SOLN
INTRAMUSCULAR | Status: AC | PRN
  Administered 2021-10-11: 1 mg via INTRAVENOUS

## 2021-10-11 MED ORDER — LIDOCAINE HCL (PF) 1 % IJ SOLN
INTRAMUSCULAR | Status: AC | PRN
  Administered 2021-10-11: 10 mL via INTRADERMAL

## 2021-10-11 MED ORDER — FENTANYL CITRATE (PF) 100 MCG/2ML IJ SOLN
INTRAMUSCULAR | Status: AC
  Filled 2021-10-11: qty 4

## 2021-10-11 MED ORDER — MIDAZOLAM HCL 2 MG/2ML IJ SOLN
INTRAMUSCULAR | Status: AC | PRN
Start: 2021-10-11 — End: 2021-10-11
  Administered 2021-10-11: .5 mg via INTRAVENOUS

## 2021-10-11 MED ORDER — IOHEXOL 300 MG/ML  SOLN
100.0000 mL | Freq: Once | INTRAMUSCULAR | Status: AC | PRN
  Administered 2021-10-11: 20 mL via INTRA_ARTERIAL

## 2021-10-11 MED ORDER — MIDAZOLAM HCL 2 MG/2ML IJ SOLN: INTRAMUSCULAR | Status: AC | PRN

## 2021-10-11 MED ORDER — LIDOCAINE HCL 1 % IJ SOLN
INTRAMUSCULAR | Status: AC
  Filled 2021-10-11: qty 20

## 2021-10-11 MED ORDER — IOHEXOL 300 MG/ML  SOLN
100.0000 mL | Freq: Once | INTRAMUSCULAR | Status: AC | PRN
  Administered 2021-10-11: 30 mL via INTRA_ARTERIAL

## 2021-10-11 NOTE — Sedation Documentation (Signed)
Patient transported to [facility] via stretcher by this Probation officer.  Lesia Hausen, RN

## 2021-10-11 NOTE — Procedures (Signed)
Interventional Radiology Procedure Note  Procedure:  1) Hepatic angiogram 2) Left hepatic artery Tc-MAA injection  Findings: Please refer to procedural dictation for full description. Right CFA access, 6 Fr Angioseal closure.  Complications: None immediate  Estimated Blood Loss: < 5 mL  Recommendations: Strict 4 hour bedrest - 2 hours flat (until 13:15), followed by 2 hours head of bed up to 30 degrees (until 15:15). IR will arrange for left lobe Y-90 administration.   Ruthann Cancer, MD Pager: [phone]

## 2021-10-11 NOTE — Discharge Instructions (Signed)
Please call Interventional Radiology clinic [phone] with any questions or concerns.  You may remove your dressing and shower tomorrow.    Hepatic Artery Radioembolization, Care After The following information offers guidance on how to care for yourself after your procedure. Your health care provider may also give you more specific instructions. If you have problems or questions, contact your health care provider. What can I expect after the procedure? After the procedure, it is possible to have: A slight fever for 7 to 10 days. This may be accompanied by pain, nausea, or vomiting, which is referred to as post-embolization syndrome. You may be given medicine to help relieve these symptoms. If your fever gets worse, tell your health care provider. Tiredness (fatigue). Loss of appetite. This should gradually improve after about 1 week. Abdominal pain on your right side. Soreness and tenderness in your groin area where the needle and catheter were placed (puncture site). Follow these instructions at home: Puncture site care Follow instructions from your health care provider about how to take care of the puncture site. Make sure you: Wash your hands with soap and water for at least 20 seconds before and after you change your bandage (dressing). If soap and water are not available, use hand sanitizer. Change your dressing as told by your health care provider. Check your puncture site every day for signs of infection. Check for: More redness, swelling, or pain. Fluid or blood. Warmth. Pus or a bad smell. Activity Rest as told by your health care provider. Return to your normal activities as told by your health care provider. Ask your health care provider what activities are safe for you. Avoid sitting for a long time without moving. Get up to take short walks every 1-2 hours. This is important to improve blood flow and breathing. Ask for help if you feel weak or unsteady. If you were given  a sedative during the procedure, it can affect you for several hours. Do not drive or operate machinery until your health care provider says that it is safe. Do not lift anything that is heavier than 10 lb (4.5 kg), or the limit that you are told, until your health care provider says that it is safe. Medicines Take over-the-counter and prescription medicines only as told by your health care provider. Ask your health care provider if the medicine prescribed to you: Requires you to avoid driving or using machinery. Can cause constipation. You may need to take these actions to prevent or treat constipation: Drink enough fluid to keep your urine pale yellow. Take over-the-counter or prescription medicines. Eat foods that are high in fiber, such as beans, whole grains, and fresh fruits and vegetables. Limit foods that are high in fat and processed sugars, such as fried or sweet foods. General instructions Eat frequent, small meals until your appetite returns. Follow instructions from your health care provider about eating or drinking restrictions. Do not take baths, swim, or use a hot tub until your health care provider approves. You may take showers. Wash your puncture site with mild soap and water, and pat the area dry. Wear compression stockings as told by your health care provider. These stockings help to prevent blood clots and reduce swelling in your legs. Keep all follow-up visits. This is important. You may need to have blood tests and imaging tests. Contact a health care provider if: You have any of these signs of infection: More redness, swelling, or pain around your puncture site. Fluid or blood coming from your  puncture site. Warmth coming from your puncture site. Pus or a bad smell coming from your puncture site. You have pain that: Gets worse. Does not get better with medicine. Feels like very bad heartburn. Is in the middle of your abdomen, above your belly button. You have any  signs of infection or liver failure, such as: Your skin or the white parts of your eyes turn yellow (jaundice). The color of your urine changes to dark brown. The color of your stool (feces) changes to light yellow. Your abdominal measurement (girth) increases in a short period of time. You gain more than 5 lb (2.3 kg) in a short period of time. Get help right away if: You have a fever that lasts more than 10 days or is higher than what your health care provider told you to expect. You develop any of the following in your legs: Pain. Swelling. Skin that is cold or pale or turns blue. You have chest pain. You have blood in your vomit, saliva, or stool. You have trouble breathing. These symptoms may represent a serious problem that is an emergency. Do not wait to see if the symptoms will go away. Get medical help right away. Call your local emergency services (911 in the [place] Do not drive yourself to the hospital. Summary After the procedure, it is possible to have a slight fever for up to 7-10 days, tiredness, loss of appetite, abdominal pain on the right side, and groin tenderness where the catheter was placed. Do not come in close contact with people for up to a week after your procedure, as told by your health care provider. Follow instructions from your health care provider about how to take care of the puncture site. Contact a health care provider if you have any signs of infection. Get help right away if you develop pain or swelling in your legs or if your legs feel cool or look pale. This information is not intended to replace advice given to you by your health care provider. Make sure you discuss any questions you have with your health care provider. Document Revised: 07/30/2020 Document Reviewed: 07/30/2020 Elsevier Patient Education  2022 [place].      Moderate Conscious Sedation, Adult, Care After This sheet gives you information about how to care for yourself after  your procedure. Your health care provider may also give you more specific instructions. If you have problems or questions, contact your health care provider. What can I expect after the procedure? After the procedure, it is common to have: Sleepiness for several hours. Impaired judgment for several hours. Difficulty with balance. Vomiting if you eat too soon. Follow these instructions at home: For the time period you were told by your health care provider: Rest. Do not participate in activities where you could fall or become injured. Do not drive or use machinery. Do not drink alcohol. Do not take sleeping pills or medicines that cause drowsiness. Do not make important decisions or sign legal documents. Do not take care of children on your own.      Eating and drinking Follow the diet recommended by your health care provider. Drink enough fluid to keep your urine pale yellow. If you vomit: Drink water, juice, or soup when you can drink without vomiting. Make sure you have little or no nausea before eating solid foods.   General instructions Take over-the-counter and prescription medicines only as told by your health care provider. Have a responsible adult stay with you for the time  you are told. It is important to have someone help care for you until you are awake and alert. Do not smoke. Keep all follow-up visits as told by your health care provider. This is important. Contact a health care provider if: You are still sleepy or having trouble with balance after 24 hours. You feel light-headed. You keep feeling nauseous or you keep vomiting. You develop a rash. You have a fever. You have redness or swelling around the IV site. Get help right away if: You have trouble breathing. You have new-onset confusion at home. Summary After the procedure, it is common to feel sleepy, have impaired judgment, or feel nauseous if you eat too soon. Rest after you get home. Know the things you  should not do after the procedure. Follow the diet recommended by your health care provider and drink enough fluid to keep your urine pale yellow. Get help right away if you have trouble breathing or new-onset confusion at home. This information is not intended to replace advice given to you by your health care provider. Make sure you discuss any questions you have with your health care provider. Document Revised: 12/24/2019 Document Reviewed: 07/22/2019 Elsevier Patient Education  2021 [place] YOUR NEXT PROCEDURE  Radiation precautions For up to a week after your procedure, there will be a small amount of radioactivity near your liver. This is not especially dangerous to other people. However, as told by your health care provider, you should follow these precautions for 7 days: Do not come in close contact with people. Do not sleep in the same bed as someone else. Do not hold children or babies. Do not have contact with pregnant women.  Post Y-90 Radioembolization Discharge Instructions  You have been given a radioactive material during your procedure.  While it is safe for you to be discharged home from the hospital, you need to proceed directly home.    Do not use public transportation, including air travel, lasting more than 2 hours for 1 week.  Avoid crowded public places for 1 week.  Adult visitors should try to avoid close contact with you for 1 week.    Children and pregnant females should not visit or have close contact with you for 1 week.  Items that you touch are not radioactive.  Do not sleep in the same bed as your partner for 1 week, and a condom should be used for sexual activity during the first 24 hours.  Your blood may be radioactive and caution should be used if any bleeding occurs during the recovery period.  Body fluids may be radioactive for 24 hours.  Wash your hands after voiding.  Men should sit to urinate.  Dispose of any soiled materials  (flush down toilet or place in trash at home) during the first day.  Drink 6 to 8 glasses of fluids per day for 5 days to hydrate yourself.  If you need to see a doctor during the first week, you must let them know that you were treated with yttrium-90 microspheres, and will be slightly radioactive.  They can call Interventional Radiology [phone] with any questions.

## 2021-10-12 LAB — AFP TUMOR MARKER: AFP, Serum, Tumor Marker: 8 ng/mL (ref 0.0–8.4)

## 2021-10-17 ENCOUNTER — Other Ambulatory Visit: Payer: Self-pay

## 2021-10-17 ENCOUNTER — Encounter: Payer: Self-pay | Admitting: Orthopedic Surgery

## 2021-10-17 ENCOUNTER — Ambulatory Visit (INDEPENDENT_AMBULATORY_CARE_PROVIDER_SITE_OTHER): Payer: Medicaid Other | Admitting: Orthopedic Surgery

## 2021-10-17 DIAGNOSIS — S76111D Strain of right quadriceps muscle, fascia and tendon, subsequent encounter: Secondary | ICD-10-CM

## 2021-10-17 MED ORDER — OXYCODONE HCL 5 MG PO TABS: 5.0000 mg | ORAL_TABLET | Freq: Four times a day (QID) | ORAL | 0 refills | Status: DC | PRN

## 2021-10-17 NOTE — Progress Notes (Signed)
Chief Complaint  Patient presents with   Post-op Follow-up      Pt Admitted to [facility] 09/26/21 due to wound infection DOS 09/04/21 RT quad tendon repair      We are now at 6 or 8 weeks post repair of this quad tendon on the right he developed a wound infection  Please see photographs in the media section  The wound is clearing up nicely on doxycycline  Recommend Neosporin dressing with Xeroform 2 x 2 tape Ace wrap  Continue doxycycline  He is in a playmaker hinged brace.  Weight-bear as tolerated  Follow-up 3 weeks  Addendum he is having a lot of tibial pain which we saw on MRI to be a stress reaction or subchondral fracture.  He is requiring oxycodone for pain control after being on hydrocodone.  He also has liver cancer and I surmise his liver is not functioning well hence the higher need for opioids  Meds ordered this encounter  Medications   oxyCODONE (OXY IR/ROXICODONE) 5 MG immediate release tablet    Sig: Take 1 tablet (5 mg total) by mouth every 6 (six) hours as needed for up to 5 days for severe pain.    Dispense:  20 tablet    Refill:  0

## 2021-10-17 NOTE — Patient Instructions (Signed)
Change dressing daily   Continue antibiotics

## 2021-10-24 ENCOUNTER — Other Ambulatory Visit: Payer: Self-pay | Admitting: Internal Medicine

## 2021-10-24 MED ORDER — PIPERACILLIN-TAZOBACTAM 3.375 G IVPB 30 MIN: 3.3750 g | Freq: Once | INTRAVENOUS | Status: DC

## 2021-10-24 MED ORDER — DEXTROSE 5 % IV SOLN: 2.0000 g | INTRAVENOUS | Status: AC

## 2021-10-25 ENCOUNTER — Other Ambulatory Visit: Payer: Self-pay

## 2021-10-25 ENCOUNTER — Other Ambulatory Visit (HOSPITAL_COMMUNITY): Payer: Self-pay | Admitting: Interventional Radiology

## 2021-10-25 ENCOUNTER — Encounter (HOSPITAL_COMMUNITY)
Admission: RE | Admit: 2021-10-25 | Discharge: 2021-10-25 | Disposition: A | Discharge status: Home / Self Care | Payer: Medicaid Other | Source: Ambulatory Visit | Attending: Interventional Radiology | Admitting: Interventional Radiology

## 2021-10-25 ENCOUNTER — Ambulatory Visit (HOSPITAL_COMMUNITY)
Admission: RE | Admit: 2021-10-25 | Discharge: 2021-10-25 | Disposition: A | Discharge status: Home / Self Care | Payer: Medicaid Other | Source: Ambulatory Visit | Attending: Interventional Radiology | Admitting: Interventional Radiology

## 2021-10-25 ENCOUNTER — Encounter (HOSPITAL_COMMUNITY): Payer: Self-pay

## 2021-10-25 DIAGNOSIS — F419 Anxiety disorder, unspecified: Secondary | ICD-10-CM | POA: Insufficient documentation

## 2021-10-25 DIAGNOSIS — Z8673 Personal history of transient ischemic attack (TIA), and cerebral infarction without residual deficits: Secondary | ICD-10-CM | POA: Diagnosis not present

## 2021-10-25 DIAGNOSIS — E119 Type 2 diabetes mellitus without complications: Secondary | ICD-10-CM | POA: Diagnosis not present

## 2021-10-25 DIAGNOSIS — C220 Liver cell carcinoma: Secondary | ICD-10-CM

## 2021-10-25 DIAGNOSIS — Z720 Tobacco use: Secondary | ICD-10-CM | POA: Diagnosis not present

## 2021-10-25 DIAGNOSIS — F32A Depression, unspecified: Secondary | ICD-10-CM | POA: Diagnosis not present

## 2021-10-25 DIAGNOSIS — I10 Essential (primary) hypertension: Secondary | ICD-10-CM | POA: Insufficient documentation

## 2021-10-25 DIAGNOSIS — K7030 Alcoholic cirrhosis of liver without ascites: Secondary | ICD-10-CM | POA: Diagnosis not present

## 2021-10-25 HISTORY — PX: IR US GUIDE VASC ACCESS RIGHT: IMG2390

## 2021-10-25 HISTORY — PX: IR ANGIOGRAM SELECTIVE EACH ADDITIONAL VESSEL: IMG667

## 2021-10-25 HISTORY — PX: IR EMBO TUMOR ORGAN ISCHEMIA INFARCT INC GUIDE ROADMAPPING: IMG5449

## 2021-10-25 LAB — GLUCOSE, CAPILLARY: Glucose-Capillary: 145 mg/dL — ABNORMAL HIGH (ref 70–99)

## 2021-10-25 LAB — COMPREHENSIVE METABOLIC PANEL
ALT: 43 U/L (ref 0–44)
AST: 48 U/L — ABNORMAL HIGH (ref 15–41)
Albumin: 4 g/dL (ref 3.5–5.0)
Alkaline Phosphatase: 114 U/L (ref 38–126)
Anion gap: 10 (ref 5–15)
BUN: 14 mg/dL (ref 6–20)
CO2: 22 mmol/L (ref 22–32)
Calcium: 9.2 mg/dL (ref 8.9–10.3)
Chloride: 105 mmol/L (ref 98–111)
Creatinine, Ser: 0.78 mg/dL (ref 0.61–1.24)
GFR, Estimated: >60 mL/min (ref >60)
Glucose, Bld: 135 mg/dL — ABNORMAL HIGH (ref 70–99)
Potassium: 3.9 mmol/L (ref 3.5–5.1)
Sodium: 137 mmol/L (ref 135–145)
Total Bilirubin: 0.7 mg/dL (ref 0.3–1.2)
Total Protein: 7.8 g/dL (ref 6.5–8.1)

## 2021-10-25 LAB — PROTIME-INR
INR: 1.1 (ref 0.8–1.2)
Prothrombin Time: 14.3 seconds (ref 11.4–15.2)

## 2021-10-25 LAB — CBC WITH DIFFERENTIAL/PLATELET
Abs Immature Granulocytes: 0.03 10*3/uL (ref 0.00–0.07)
Basophils Absolute: 0.1 10*3/uL (ref 0.0–0.1)
Basophils Relative: 1 %
Eosinophils Absolute: 0.2 10*3/uL (ref 0.0–0.5)
Eosinophils Relative: 2 %
HCT: 44 % (ref 39.0–52.0)
Hemoglobin: 15 g/dL (ref 13.0–17.0)
Immature Granulocytes: 0 %
Lymphocytes Relative: 24 %
Lymphs Abs: 1.9 10*3/uL (ref 0.7–4.0)
MCH: 29.5 pg (ref 26.0–34.0)
MCHC: 34.1 g/dL (ref 30.0–36.0)
MCV: 86.6 fL (ref 80.0–100.0)
Monocytes Absolute: 0.4 10*3/uL (ref 0.1–1.0)
Monocytes Relative: 5 %
Neutro Abs: 5.5 10*3/uL (ref 1.7–7.7)
Neutrophils Relative %: 68 %
Platelets: 113 10*3/uL — ABNORMAL LOW (ref 150–400)
RBC: 5.08 MIL/uL (ref 4.22–5.81)
RDW: 13.1 % (ref 11.5–15.5)
WBC: 8.1 10*3/uL (ref 4.0–10.5)
nRBC: 0 % (ref 0.0–0.2)

## 2021-10-25 MED ORDER — FENTANYL CITRATE (PF) 100 MCG/2ML IJ SOLN
INTRAMUSCULAR | Status: AC
  Filled 2021-10-25: qty 2

## 2021-10-25 MED ORDER — DEXAMETHASONE SODIUM PHOSPHATE 10 MG/ML IJ SOLN
8.0000 mg | Freq: Once | INTRAMUSCULAR | Status: AC
  Administered 2021-10-25: 8 mg via INTRAVENOUS
  Filled 2021-10-25: qty 1

## 2021-10-25 MED ORDER — SODIUM CHLORIDE 0.9 % IV SOLN
8.0000 mg | Freq: Once | INTRAVENOUS | Status: AC
  Administered 2021-10-25: 8 mg via INTRAVENOUS
  Filled 2021-10-25: qty 4

## 2021-10-25 MED ORDER — PANTOPRAZOLE SODIUM 40 MG IV SOLR
40.0000 mg | Freq: Once | INTRAVENOUS | Status: DC
  Filled 2021-10-25: qty 10

## 2021-10-25 MED ORDER — IOHEXOL 300 MG/ML  SOLN
100.0000 mL | Freq: Once | INTRAMUSCULAR | Status: AC | PRN
  Administered 2021-10-25: 30 mL via INTRA_ARTERIAL

## 2021-10-25 MED ORDER — FENTANYL CITRATE (PF) 100 MCG/2ML IJ SOLN
INTRAMUSCULAR | Status: AC | PRN
  Administered 2021-10-25 (×2): 50 ug via INTRAVENOUS

## 2021-10-25 MED ORDER — LIDOCAINE HCL 1 % IJ SOLN
INTRAMUSCULAR | Status: AC
  Filled 2021-10-25: qty 20

## 2021-10-25 MED ORDER — IOHEXOL 300 MG/ML  SOLN
100.0000 mL | Freq: Once | INTRAMUSCULAR | Status: AC | PRN
  Administered 2021-10-25: 20 mL via INTRA_ARTERIAL

## 2021-10-25 MED ORDER — SODIUM CHLORIDE 0.9 % IV SOLN
2.0000 g | INTRAVENOUS | Status: AC
  Administered 2021-10-25: 2 g via INTRAVENOUS
  Filled 2021-10-25: qty 2

## 2021-10-25 MED ORDER — MIDAZOLAM HCL 2 MG/2ML IJ SOLN
INTRAMUSCULAR | Status: AC | PRN
  Administered 2021-10-25 (×2): 1 mg via INTRAVENOUS

## 2021-10-25 MED ORDER — SODIUM CHLORIDE 0.9 % IV SOLN: INTRAVENOUS | Status: DC

## 2021-10-25 MED ORDER — YTTRIUM 90 INJECTION: 21.9000 | INJECTION | Freq: Once | INTRAVENOUS | Status: DC

## 2021-10-25 MED ORDER — MIDAZOLAM HCL 2 MG/2ML IJ SOLN
INTRAMUSCULAR | Status: AC
  Filled 2021-10-25: qty 2

## 2021-10-25 NOTE — Discharge Instructions (Signed)
Please call Interventional Radiology clinic [phone] with any questions or concerns.  You may remove your dressing and shower tomorrow.    Hepatic Artery Radioembolization, Care After The following information offers guidance on how to care for yourself after your procedure. Your health care provider may also give you more specific instructions. If you have problems or questions, contact your health care provider. What can I expect after the procedure? After the procedure, it is possible to have: A slight fever for 7 to 10 days. This may be accompanied by pain, nausea, or vomiting, which is referred to as post-embolization syndrome. You may be given medicine to help relieve these symptoms. If your fever gets worse, tell your health care provider. Tiredness (fatigue). Loss of appetite. This should gradually improve after about 1 week. Abdominal pain on your right side. Soreness and tenderness in your groin area where the needle and catheter were placed (puncture site). Follow these instructions at home: Puncture site care Follow instructions from your health care provider about how to take care of the puncture site. Make sure you: Wash your hands with soap and water for at least 20 seconds before and after you change your bandage (dressing). If soap and water are not available, use hand sanitizer. Change your dressing as told by your health care provider. Check your puncture site every day for signs of infection. Check for: More redness, swelling, or pain. Fluid or blood. Warmth. Pus or a bad smell. Activity Rest as told by your health care provider. Return to your normal activities as told by your health care provider. Ask your health care provider what activities are safe for you. Avoid sitting for a long time without moving. Get up to take short walks every 1-2 hours. This is important to improve blood flow and breathing. Ask for help if you feel weak or unsteady. If you were given  a sedative during the procedure, it can affect you for several hours. Do not drive or operate machinery until your health care provider says that it is safe. Do not lift anything that is heavier than 10 lb (4.5 kg), or the limit that you are told, until your health care provider says that it is safe. Medicines Take over-the-counter and prescription medicines only as told by your health care provider. Ask your health care provider if the medicine prescribed to you: Requires you to avoid driving or using machinery. Can cause constipation. You may need to take these actions to prevent or treat constipation: Drink enough fluid to keep your urine pale yellow. Take over-the-counter or prescription medicines. Eat foods that are high in fiber, such as beans, whole grains, and fresh fruits and vegetables. Limit foods that are high in fat and processed sugars, such as fried or sweet foods. General instructions Eat frequent, small meals until your appetite returns. Follow instructions from your health care provider about eating or drinking restrictions. Do not take baths, swim, or use a hot tub until your health care provider approves. You may take showers. Wash your puncture site with mild soap and water, and pat the area dry. Wear compression stockings as told by your health care provider. These stockings help to prevent blood clots and reduce swelling in your legs. Keep all follow-up visits. This is important. You may need to have blood tests and imaging tests. Contact a health care provider if: You have any of these signs of infection: More redness, swelling, or pain around your puncture site. Fluid or blood coming from your  puncture site. Warmth coming from your puncture site. Pus or a bad smell coming from your puncture site. You have pain that: Gets worse. Does not get better with medicine. Feels like very bad heartburn. Is in the middle of your abdomen, above your belly button. You have any  signs of infection or liver failure, such as: Your skin or the white parts of your eyes turn yellow (jaundice). The color of your urine changes to dark brown. The color of your stool (feces) changes to light yellow. Your abdominal measurement (girth) increases in a short period of time. You gain more than 5 lb (2.3 kg) in a short period of time. Get help right away if: You have a fever that lasts more than 10 days or is higher than what your health care provider told you to expect. You develop any of the following in your legs: Pain. Swelling. Skin that is cold or pale or turns blue. You have chest pain. You have blood in your vomit, saliva, or stool. You have trouble breathing. These symptoms may represent a serious problem that is an emergency. Do not wait to see if the symptoms will go away. Get medical help right away. Call your local emergency services (911 in the [place] Do not drive yourself to the hospital. Summary After the procedure, it is possible to have a slight fever for up to 7-10 days, tiredness, loss of appetite, abdominal pain on the right side, and groin tenderness where the catheter was placed. Do not come in close contact with people for up to a week after your procedure, as told by your health care provider. Follow instructions from your health care provider about how to take care of the puncture site. Contact a health care provider if you have any signs of infection. Get help right away if you develop pain or swelling in your legs or if your legs feel cool or look pale. This information is not intended to replace advice given to you by your health care provider. Make sure you discuss any questions you have with your health care provider. Document Revised: 07/30/2020 Document Reviewed: 07/30/2020 Elsevier Patient Education  2022 [place].      Moderate Conscious Sedation, Adult, Care After This sheet gives you information about how to care for yourself after  your procedure. Your health care provider may also give you more specific instructions. If you have problems or questions, contact your health care provider. What can I expect after the procedure? After the procedure, it is common to have: Sleepiness for several hours. Impaired judgment for several hours. Difficulty with balance. Vomiting if you eat too soon. Follow these instructions at home: For the time period you were told by your health care provider: Rest. Do not participate in activities where you could fall or become injured. Do not drive or use machinery. Do not drink alcohol. Do not take sleeping pills or medicines that cause drowsiness. Do not make important decisions or sign legal documents. Do not take care of children on your own.      Eating and drinking Follow the diet recommended by your health care provider. Drink enough fluid to keep your urine pale yellow. If you vomit: Drink water, juice, or soup when you can drink without vomiting. Make sure you have little or no nausea before eating solid foods.   General instructions Take over-the-counter and prescription medicines only as told by your health care provider. Have a responsible adult stay with you for the time  you are told. It is important to have someone help care for you until you are awake and alert. Do not smoke. Keep all follow-up visits as told by your health care provider. This is important. Contact a health care provider if: You are still sleepy or having trouble with balance after 24 hours. You feel light-headed. You keep feeling nauseous or you keep vomiting. You develop a rash. You have a fever. You have redness or swelling around the IV site. Get help right away if: You have trouble breathing. You have new-onset confusion at home. Summary After the procedure, it is common to feel sleepy, have impaired judgment, or feel nauseous if you eat too soon. Rest after you get home. Know the things you  should not do after the procedure. Follow the diet recommended by your health care provider and drink enough fluid to keep your urine pale yellow. Get help right away if you have trouble breathing or new-onset confusion at home. This information is not intended to replace advice given to you by your health care provider. Make sure you discuss any questions you have with your health care provider. Document Revised: 12/24/2019 Document Reviewed: 07/22/2019 Elsevier Patient Education  [place].   Radiation precautions For up to a week after your procedure, there will be a small amount of radioactivity near your liver. This is not especially dangerous to other people. However, as told by your health care provider, you should follow these precautions for 7 days: Do not come in close contact with people. Do not sleep in the same bed as someone else. Do not hold children or babies. Do not have contact with pregnant women.  Post Y-90 Radioembolization Discharge Instructions  You have been given a radioactive material during your procedure.  While it is safe for you to be discharged home from the hospital, you need to proceed directly home.    Do not use public transportation, including air travel, lasting more than 2 hours for 1 week.  Avoid crowded public places for 1 week.  Adult visitors should try to avoid close contact with you for 1 week.    Children and pregnant females should not visit or have close contact with you for 1 week.  Items that you touch are not radioactive.  Do not sleep in the same bed as your partner for 1 week, and a condom should be used for sexual activity during the first 24 hours.  Your blood may be radioactive and caution should be used if any bleeding occurs during the recovery period.  Body fluids may be radioactive for 24 hours.  Wash your hands after voiding.  Men should sit to urinate.  Dispose of any soiled materials (flush down toilet or place in  trash at home) during the first day.  Drink 6 to 8 glasses of fluids per day for 5 days to hydrate yourself.  If you need to see a doctor during the first week, you must let them know that you were treated with yttrium-90 microspheres, and will be slightly radioactive.  They can call Interventional Radiology [phone] with any questions.

## 2021-10-25 NOTE — H&P (Addendum)
Referring Physician(s): Drazek,D  Supervising Physician: Ruthann Cancer  Patient Status:  [facility] OP  Chief Complaint:  Hepatocellular carcinoma  Subjective: Patient familiar to IR service from consultation with Dr. Serafina Royals on 08/23/2021 to discuss treatment options for multifocal hepatocellular carcinoma.  He was deemed an appropriate candidate for hepatic Y90 radioembolization and underwent arterial roadmapping study on 10/11/2021.  He presents today for left hepatic lobe Y90 radioembolization treatment phase.  Medical history also significant for alcoholic cirrhosis, tobacco use, anxiety, prior CVA, depression, diabetes, hypertension, and migraines. He currently denies fever,HA,CP,dyspnea, cough, back pain,N/V or bleeding. He does have some mild mid/epig discomfort .  Past Medical History:  Diagnosis Date   Alcohol abuse    Anxiety    Back pain    CVA (cerebral infarction)    Depression    Diabetes mellitus    Hypertension    Migraines    Stroke [facility])    Past Surgical History:  Procedure Laterality Date   FOOT SURGERY     IR 3D INDEPENDENT [facility]KST  10/11/2021   IR ANGIOGRAM SELECTIVE EACH ADDITIONAL VESSEL  10/11/2021   IR ANGIOGRAM SELECTIVE EACH ADDITIONAL VESSEL  10/11/2021   IR ANGIOGRAM SELECTIVE EACH ADDITIONAL VESSEL  10/11/2021   IR ANGIOGRAM SELECTIVE EACH ADDITIONAL VESSEL  10/11/2021   IR ANGIOGRAM SELECTIVE EACH ADDITIONAL VESSEL  10/11/2021   IR ANGIOGRAM SELECTIVE EACH ADDITIONAL VESSEL  10/11/2021   IR ANGIOGRAM VISCERAL SELECTIVE  10/11/2021   IR RADIOLOGIST EVAL & MGMT  08/23/2021   IR US GUIDE VASC ACCESS RIGHT  10/11/2021   MANDIBLE SURGERY     QUADRICEPS TENDON REPAIR Right 09/04/2021   Procedure: REPAIR QUADRICEP TENDON;  Surgeon: Carole Civil, MD;  Location: [facility];  Service: Orthopedics;  Laterality: Right;       Allergies: Ultram [tramadol] and Benadryl [diphenhydramine]  Medications: Prior to Admission medications   Medication Sig Start Date End Date  Taking? Authorizing Provider  albuterol (VENTOLIN HFA) 108 (90 Base) MCG/ACT inhaler Inhale 2 puffs into the lungs every 6 (six) hours as needed for wheezing or shortness of breath.   Yes [provider]  amoxicillin-clavulanate (AUGMENTIN) 875-125 MG tablet Take 1 tablet by mouth 2 (two) times daily for 28 days. 09/29/21 10/27/21 Yes Carole Civil, MD  busPIRone (BUSPAR) 15 MG tablet Take 15 mg by mouth 2 (two) times daily.   Yes [provider]  Cholecalciferol (VITAMIN D-3) 125 MCG (5000 UT) TABS Take 5,000 Units by mouth daily.   Yes [provider]  citalopram (CELEXA) 20 MG tablet Take 20 mg by mouth daily.   Yes [provider]  gabapentin (NEURONTIN) 600 MG tablet Take 600 mg by mouth in the morning and at bedtime. 06/24/21  Yes [provider]  hydrOXYzine (ATARAX) 25 MG tablet Take 25 mg by mouth 3 (three) times daily as needed for anxiety.   Yes [provider]  meloxicam (MOBIC) 7.5 MG tablet Take 7.5 mg by mouth in the morning and at bedtime.   Yes [provider]  metFORMIN (GLUCOPHAGE) 500 MG tablet Take 1 tablet (500 mg total) by mouth 2 (two) times daily with a meal. 01/01/18  Yes Julianne Rice, MD  omeprazole (PRILOSEC) 40 MG capsule Take 1 capsule (40 mg total) by mouth daily before breakfast. 06/06/21  Yes Erenest Rasher, PA-C  doxycycline (VIBRA-TABS) 100 MG tablet Take 1 tablet (100 mg total) by mouth 2 (two) times daily for 28 days. 09/29/21 10/27/21  Carole Civil,  MD  QUEtiapine (SEROQUEL) 100 MG tablet Take 100 mg by mouth at bedtime.    [provider]     Vital Signs: BP 133/76    Pulse 62    Temp 97.6 F (36.4 C) (Oral)    Resp 18    Ht 5\' 10"  (1.778 m)    Wt 170 lb (77.1 kg)    SpO2 98%    BMI 24.39 kg/m   Physical Exam awake/alert; chest- few insp wheezes; heart- NL rate, occ ectopy/pause; abd- soft,+BS,mildly tender mid abd region to palpation; no sig LE edema  Imaging: No  results found.  Labs:  CBC: Recent Labs    06/11/21 0907 07/10/21 1947 09/26/21 1239 10/11/21 0743  WBC 5.7 7.4 7.9 5.9  HGB 15.9 14.6 15.1 14.6  HCT 47.7 43.1 45.5 42.1  PLT 112* 151 156 113*    COAGS: Recent Labs    06/11/21 0907 10/11/21 0743  INR 1.1 1.1    BMP: Recent Labs    06/11/21 0907 07/10/21 1947 09/24/21 1005 09/26/21 1239 10/11/21 0743  NA 134* 132*  --  139 135  K 4.0 3.7  --  4.1 4.1  CL 101 99  --  104 106  CO2 22 26  --  24 23  GLUCOSE 398* 140*  --  115* 166*  BUN 10 18  --  13 14  CALCIUM 9.2 8.7*  --  9.5 9.1  CREATININE 0.83 0.80 0.90 0.85 0.78  GFRNONAA  --  >60  --  >60 >60    LIVER FUNCTION TESTS: Recent Labs    06/11/21 0907 09/26/21 1239 10/11/21 0743  BILITOT 0.8 0.9 0.6  AST 61* 63* 41  ALT 71* 49* 46*  ALKPHOS  --  105 116  PROT 7.1 7.2 6.8  ALBUMIN  --  3.6 3.4*    Assessment and Plan: Patient familiar to IR service from consultation with Dr. Serafina Royals on 08/23/2021 to discuss treatment options for multifocal hepatocellular carcinoma.  He was deemed an appropriate candidate for hepatic Y90 radioembolization and underwent arterial roadmapping study on 10/11/2021.  He presents today for left hepatic lobe Y90 radioembolization treatment phase.  Medical history also significant for alcoholic cirrhosis, tobacco use, anxiety, prior CVA, depression, diabetes, hypertension, and migraines.Risks and benefits of procedure were discussed with the patient including, but not limited to bleeding, infection, vascular injury or contrast induced renal failure.  This interventional procedure involves the use of X-rays and because of the nature of the planned procedure, it is possible that we will have prolonged use of X-ray fluoroscopy.  Potential radiation risks to you include (but are not limited to) the following: - A slightly elevated risk for cancer  several years later in life. This risk is typically less than 0.5% percent. This risk is  low in comparison to the normal incidence of human cancer, which is 33% for women and 50% for men according to the [place]. - Radiation induced injury can include skin redness, resembling a rash, tissue breakdown / ulcers and hair loss (which can be temporary or permanent).   The likelihood of either of these occurring depends on the difficulty of the procedure and whether you are sensitive to radiation due to previous procedures, disease, or genetic conditions.   IF your procedure requires a prolonged use of radiation, you will be notified and given written instructions for further action.  It is your responsibility to monitor the irradiated area for the 2 weeks following the procedure  and to notify your physician if you are concerned that you have suffered a radiation induced injury.    All of the patient's questions were answered, patient is agreeable to proceed.  Consent signed and in chart.   LABS PENDING   Electronically Signed: D. Rowe Robert, PA-C 10/25/2021, 9:12 AM   I spent a total of 20 minutes at the the patient's bedside AND on the patient's hospital floor or unit, greater than 50% of which was counseling/coordinating care for hepatic/visceral arteriogram with left hepatic lobe Y-90 radioembolization

## 2021-10-25 NOTE — Procedures (Signed)
Interventional Radiology Procedure Note  Procedure:  Left hepatic transarterial radioembolization  Findings: Please refer to procedural dictation for full description. 6 Fr right CFA angioseal.  Complications: None immediate  Estimated Blood Loss: < 5 mL  Recommendations: Strict 4 hour bedrest (flat until 15:30, head up bed up to 30 degrees from 15:30-17:30). IR will arrange for clinic follow up in 2-3 weeks, no imaging at that time.   Ruthann Cancer, MD Pager: [phone]

## 2021-10-29 ENCOUNTER — Ambulatory Visit (INDEPENDENT_AMBULATORY_CARE_PROVIDER_SITE_OTHER): Payer: Medicaid Other | Admitting: Orthopedic Surgery

## 2021-10-29 ENCOUNTER — Encounter: Payer: Self-pay | Admitting: Orthopedic Surgery

## 2021-10-29 ENCOUNTER — Other Ambulatory Visit: Payer: Self-pay | Admitting: Interventional Radiology

## 2021-10-29 ENCOUNTER — Other Ambulatory Visit: Payer: Self-pay

## 2021-10-29 DIAGNOSIS — T8189XD Other complications of procedures, not elsewhere classified, subsequent encounter: Secondary | ICD-10-CM

## 2021-10-29 DIAGNOSIS — S76111D Strain of right quadriceps muscle, fascia and tendon, subsequent encounter: Secondary | ICD-10-CM

## 2021-10-29 DIAGNOSIS — S86811A Strain of other muscle(s) and tendon(s) at lower leg level, right leg, initial encounter: Secondary | ICD-10-CM

## 2021-10-29 DIAGNOSIS — C220 Liver cell carcinoma: Secondary | ICD-10-CM

## 2021-10-29 MED ORDER — OXYCODONE HCL 5 MG PO TABS: 5.0000 mg | ORAL_TABLET | Freq: Four times a day (QID) | ORAL | 0 refills | Status: DC | PRN

## 2021-10-29 NOTE — Progress Notes (Signed)
°  Chief Complaint  Patient presents with   Wound Check    09/04/21 right quad tendon repair/  incision still not healed in     56 year old male had a rupture of his right quadriceps tendon repaired on 09/04/2021 he developed postop wound cellulitis and was treated with oral antibiotics  He came in today after a phone call saying that his wound looked worse and it is split open  He still has no active extension.  The quadriceps repair area looks fine the patellar tendon insertion looks to have a defect  This is very unclear as to why as this was explored at the time of surgery and was not ruptured even the quadriceps tendon despite having tendinosis did not have a frank rupture  In any event his wound has dehisced he is having pain this will require the following  Right knee:  Incision and drainage of the wound excision of eschar  Exploration of patellar tendon  Cast with window  Postop antibiotics for 4 weeks

## 2021-10-29 NOTE — Patient Instructions (Signed)
Incision and drainage right knee  Patellar tendon exploration possible repair  Long-leg cast with window

## 2021-10-30 ENCOUNTER — Telehealth: Payer: Self-pay | Admitting: Radiology

## 2021-10-30 ENCOUNTER — Other Ambulatory Visit: Payer: Self-pay | Admitting: Orthopedic Surgery

## 2021-10-30 NOTE — Telephone Encounter (Signed)
-----   Message from Uvaldo Bristle sent at 10/30/2021  2:42 PM EST ----- Regarding: Pre-service center call Jessamyn Watterson,  Call received from [place] at [facility] pre-service center called re: pre-auth for this patient.  [phone] ext [phone]

## 2021-10-30 NOTE — Telephone Encounter (Signed)
I have asked Danny [name] to look at this, we added him on for Friday but I haven't looked at pre auth yet, its an I and D and patella tendon repair, likely not required.

## 2021-10-30 NOTE — Patient Instructions (Signed)
Danny [name]  10/30/2021     @PREFPERIOPPHARMACY @   Your procedure is scheduled on  11/02/2021.   Report to [facility] at  [email or web address].   Call this number if you have problems the morning of surgery:  [phone]   Remember:  Do not eat or drink after midnight.      Use your inhaler before you come and bring your rescue inhaler with you.    Take these medicines the morning of surgery with A SIP OF WATER        celexa, gabapentin, atarax(if needed), mobic or oxycodone(if needed), prilosec.    Do not wear jewelry, make-up or nail polish.  Do not wear lotions, powders, or perfumes, or deodorant.  Do not shave 48 hours prior to surgery.  Men may shave face and neck.  Do not bring valuables to the hospital.  [facility] is not responsible for any belongings or valuables.  Contacts, dentures or bridgework may not be worn into surgery.  Leave your suitcase in the car.  After surgery it may be brought to your room.  For patients admitted to the hospital, discharge time will be determined by your treatment team.  Patients discharged the day of surgery will not be allowed to drive home and must have someone with them for 24 hours.    Special instructions:   DO NOT smoke tobacco or vape for 24 hours before your procedure.  Please read over the following fact sheets that you were given. Coughing and Deep Breathing, Surgical Site Infection Prevention, Anesthesia Post-op Instructions, and Care and Recovery After Surgery      Incision and Drainage, Care After This sheet gives you information about how to care for yourself after your procedure. Your health care provider may also give you more specific instructions. If you have problems or questions, contact your health care provider. What can I expect after the procedure? After the procedure, it is common to have: Pain or discomfort around the incision site. Blood, fluid, or pus (drainage) from the incision. Redness  and firm skin around the incision site. Follow these instructions at home: Medicines Take over-the-counter and prescription medicines only as told by your health care provider. If you were prescribed an antibiotic medicine, use or take it as told by your health care provider. Do not stop using the antibiotic even if you start to feel better. Wound care Follow instructions from your health care provider about how to take care of your wound. Make sure you: Wash your hands with soap and water before and after you change your bandage (dressing). If soap and water are not available, use hand sanitizer. Change your dressing and packing as told by your health care provider. If your dressing is dry or stuck when you try to remove it, moisten or wet the dressing with saline or water so that it can be removed without harming your skin or tissues. If your wound is packed, leave it in place until your health care provider tells you to remove it. To remove the packing, moisten or wet the packing with saline or water so that it can be removed without harming your skin or tissues. Leave stitches (sutures), skin glue, or adhesive strips in place. These skin closures may need to stay in place for 2 weeks or longer. If adhesive strip edges start to loosen and curl up, you may trim the loose edges. Do not remove adhesive strips completely unless  your health care provider tells you to do that. Check your wound every day for signs of infection. Check for: More redness, swelling, or pain. More fluid or blood. Warmth. Pus or a bad smell. If you were sent home with a drain tube in place, follow instructions from your health care provider about: How to empty it. How to care for it at home.  General instructions Rest the affected area. Do not take baths, swim, or use a hot tub until your health care provider approves. Ask your health care provider if you may take showers. You may only be allowed to take sponge  baths. Return to your normal activities as told by your health care provider. Ask your health care provider what activities are safe for you. Your health care provider may put you on activity or lifting restrictions. The incision will continue to drain. It is normal to have some clear or slightly bloody drainage. The amount of drainage should lessen each day. Do not apply any creams, ointments, or liquids unless you have been told to by your health care provider. Keep all follow-up visits as told by your health care provider. This is important. Contact a health care provider if: Your cyst or abscess returns. You have a fever or chills. You have more redness, swelling, or pain around your incision. You have more fluid or blood coming from your incision. Your incision feels warm to the touch. You have pus or a bad smell coming from your incision. You have red streaks above or below the incision site. Get help right away if: You have severe pain or bleeding. You cannot eat or drink without vomiting. You have decreased urine output. You become short of breath. You have chest pain. You cough up blood. The affected area becomes numb or starts to tingle. These symptoms may represent a serious problem that is an emergency. Do not wait to see if the symptoms will go away. Get medical help right away. Call your local emergency services (911 in the [place] Do not drive yourself to the hospital. Summary After this procedure, it is common to have fluid, blood, or pus coming from the surgery site. Follow all home care instructions. You will be told how to take care of your incision, how to check for infection, and how to take medicines. If you were prescribed an antibiotic medicine, take it as told by your health care provider. Do not stop taking the antibiotic even if you start to feel better. Contact a health care provider if you have increased redness, swelling, or pain around your incision. Get help  right away if you have chest pain, you vomit, you cough up blood, or you have shortness of breath. Keep all follow-up visits as told by your health care provider. This is important. This information is not intended to replace advice given to you by your health care provider. Make sure you discuss any questions you have with your health care provider. Document Revised: 07/27/2018 Document Reviewed: 07/27/2018 Elsevier Patient Education  2022 [place] Anesthesia, Adult, Care After This sheet gives you information about how to care for yourself after your procedure. Your health care provider may also give you more specific instructions. If you have problems or questions, contact your health care provider. What can I expect after the procedure? After the procedure, the following side effects are common: Pain or discomfort at the IV site. Nausea. Vomiting. Sore throat. Trouble concentrating. Feeling cold or chills. Feeling weak or tired. Sleepiness  and fatigue. Soreness and body aches. These side effects can affect parts of the body that were not involved in surgery. Follow these instructions at home: For the time period you were told by your health care provider:  Rest. Do not participate in activities where you could fall or become injured. Do not drive or use machinery. Do not drink alcohol. Do not take sleeping pills or medicines that cause drowsiness. Do not make important decisions or sign legal documents. Do not take care of children on your own. Eating and drinking Follow any instructions from your health care provider about eating or drinking restrictions. When you feel hungry, start by eating small amounts of foods that are soft and easy to digest (bland), such as toast. Gradually return to your regular diet. Drink enough fluid to keep your urine pale yellow. If you vomit, rehydrate by drinking water, juice, or clear broth. General instructions If you have sleep  apnea, surgery and certain medicines can increase your risk for breathing problems. Follow instructions from your health care provider about wearing your sleep device: Anytime you are sleeping, including during daytime naps. While taking prescription pain medicines, sleeping medicines, or medicines that make you drowsy. Have a responsible adult stay with you for the time you are told. It is important to have someone help care for you until you are awake and alert. Return to your normal activities as told by your health care provider. Ask your health care provider what activities are safe for you. Take over-the-counter and prescription medicines only as told by your health care provider. If you smoke, do not smoke without supervision. Keep all follow-up visits as told by your health care provider. This is important. Contact a health care provider if: You have nausea or vomiting that does not get better with medicine. You cannot eat or drink without vomiting. You have pain that does not get better with medicine. You are unable to pass urine. You develop a skin rash. You have a fever. You have redness around your IV site that gets worse. Get help right away if: You have difficulty breathing. You have chest pain. You have blood in your urine or stool, or you vomit blood. Summary After the procedure, it is common to have a sore throat or nausea. It is also common to feel tired. Have a responsible adult stay with you for the time you are told. It is important to have someone help care for you until you are awake and alert. When you feel hungry, start by eating small amounts of foods that are soft and easy to digest (bland), such as toast. Gradually return to your regular diet. Drink enough fluid to keep your urine pale yellow. Return to your normal activities as told by your health care provider. Ask your health care provider what activities are safe for you. This information is not intended to  replace advice given to you by your health care provider. Make sure you discuss any questions you have with your health care provider. Document Revised: 05/11/2020 Document Reviewed: 12/09/2019 Elsevier Patient Education  2022 [place]. How to Use Chlorhexidine for Bathing Chlorhexidine gluconate (CHG) is a germ-killing (antiseptic) solution that is used to clean the skin. It can get rid of the bacteria that normally live on the skin and can keep them away for about 24 hours. To clean your skin with CHG, you may be given: A CHG solution to use in the shower or as part of a sponge bath. A  prepackaged cloth that contains CHG. Cleaning your skin with CHG may help lower the risk for infection: While you are staying in the intensive care unit of the hospital. If you have a vascular access, such as a central line, to provide short-term or long-term access to your veins. If you have a catheter to drain urine from your bladder. If you are on a ventilator. A ventilator is a machine that helps you breathe by moving air in and out of your lungs. After surgery. What are the risks? Risks of using CHG include: A skin reaction. Hearing loss, if CHG gets in your ears and you have a perforated eardrum. Eye injury, if CHG gets in your eyes and is not rinsed out. The CHG product catching fire. Make sure that you avoid smoking and flames after applying CHG to your skin. Do not use CHG: If you have a chlorhexidine allergy or have previously reacted to chlorhexidine. On babies younger than 66 months of age. How to use CHG solution Use CHG only as told by your health care provider, and follow the instructions on the label. Use the full amount of CHG as directed. Usually, this is one bottle. During a shower Follow these steps when using CHG solution during a shower (unless your health care provider gives you different instructions): Start the shower. Use your normal soap and shampoo to wash your face and  hair. Turn off the shower or move out of the shower stream. Pour the CHG onto a clean washcloth. Do not use any type of brush or rough-edged sponge. Starting at your neck, lather your body down to your toes. Make sure you follow these instructions: If you will be having surgery, pay special attention to the part of your body where you will be having surgery. Scrub this area for at least 1 minute. Do not use CHG on your head or face. If the solution gets into your ears or eyes, rinse them well with water. Avoid your genital area. Avoid any areas of skin that have broken skin, cuts, or scrapes. Scrub your back and under your arms. Make sure to wash skin folds. Let the lather sit on your skin for 1-2 minutes or as long as told by your health care provider. Thoroughly rinse your entire body in the shower. Make sure that all body creases and crevices are rinsed well. Dry off with a clean towel. Do not put any substances on your body afterward--such as powder, lotion, or perfume--unless you are told to do so by your health care provider. Only use lotions that are recommended by the manufacturer. Put on clean clothes or pajamas. If it is the night before your surgery, sleep in clean sheets.  During a sponge bath Follow these steps when using CHG solution during a sponge bath (unless your health care provider gives you different instructions): Use your normal soap and shampoo to wash your face and hair. Pour the CHG onto a clean washcloth. Starting at your neck, lather your body down to your toes. Make sure you follow these instructions: If you will be having surgery, pay special attention to the part of your body where you will be having surgery. Scrub this area for at least 1 minute. Do not use CHG on your head or face. If the solution gets into your ears or eyes, rinse them well with water. Avoid your genital area. Avoid any areas of skin that have broken skin, cuts, or scrapes. Scrub your back  and under your  arms. Make sure to wash skin folds. Let the lather sit on your skin for 1-2 minutes or as long as told by your health care provider. Using a different clean, wet washcloth, thoroughly rinse your entire body. Make sure that all body creases and crevices are rinsed well. Dry off with a clean towel. Do not put any substances on your body afterward--such as powder, lotion, or perfume--unless you are told to do so by your health care provider. Only use lotions that are recommended by the manufacturer. Put on clean clothes or pajamas. If it is the night before your surgery, sleep in clean sheets. How to use CHG prepackaged cloths Only use CHG cloths as told by your health care provider, and follow the instructions on the label. Use the CHG cloth on clean, dry skin. Do not use the CHG cloth on your head or face unless your health care provider tells you to. When washing with the CHG cloth: Avoid your genital area. Avoid any areas of skin that have broken skin, cuts, or scrapes. Before surgery Follow these steps when using a CHG cloth to clean before surgery (unless your health care provider gives you different instructions): Using the CHG cloth, vigorously scrub the part of your body where you will be having surgery. Scrub using a back-and-forth motion for 3 minutes. The area on your body should be completely wet with CHG when you are done scrubbing. Do not rinse. Discard the cloth and let the area air-dry. Do not put any substances on the area afterward, such as powder, lotion, or perfume. Put on clean clothes or pajamas. If it is the night before your surgery, sleep in clean sheets.  For general bathing Follow these steps when using CHG cloths for general bathing (unless your health care provider gives you different instructions). Use a separate CHG cloth for each area of your body. Make sure you wash between any folds of skin and between your fingers and toes. Wash your body in the  following order, switching to a new cloth after each step: The front of your neck, shoulders, and chest. Both of your arms, under your arms, and your hands. Your stomach and groin area, avoiding the genitals. Your right leg and foot. Your left leg and foot. The back of your neck, your back, and your buttocks. Do not rinse. Discard the cloth and let the area air-dry. Do not put any substances on your body afterward--such as powder, lotion, or perfume--unless you are told to do so by your health care provider. Only use lotions that are recommended by the manufacturer. Put on clean clothes or pajamas. Contact a health care provider if: Your skin gets irritated after scrubbing. You have questions about using your solution or cloth. You swallow any chlorhexidine. Call your local poison control center (1-984 165 6173 in the [place] Get help right away if: Your eyes itch badly, or they become very red or swollen. Your skin itches badly and is red or swollen. Your hearing changes. You have trouble seeing. You have swelling or tingling in your mouth or throat. You have trouble breathing. These symptoms may represent a serious problem that is an emergency. Do not wait to see if the symptoms will go away. Get medical help right away. Call your local emergency services (911 in the [place] Do not drive yourself to the hospital. Summary Chlorhexidine gluconate (CHG) is a germ-killing (antiseptic) solution that is used to clean the skin. Cleaning your skin with CHG may help to lower your  risk for infection. You may be given CHG to use for bathing. It may be in a bottle or in a prepackaged cloth to use on your skin. Carefully follow your health care provider's instructions and the instructions on the product label. Do not use CHG if you have a chlorhexidine allergy. Contact your health care provider if your skin gets irritated after scrubbing. This information is not intended to replace advice given to you  by your health care provider. Make sure you discuss any questions you have with your health care provider. Document Revised: 11/06/2020 Document Reviewed: 11/06/2020 Elsevier Patient Education  2022 [employer].

## 2021-10-30 NOTE — Telephone Encounter (Signed)
I have called No auth required for [place]  Reference number for call [id number]

## 2021-10-31 ENCOUNTER — Encounter (HOSPITAL_COMMUNITY)
Admission: RE | Admit: 2021-10-31 | Discharge: 2021-10-31 | Disposition: A | Discharge status: Home / Self Care | Payer: Medicaid Other | Source: Ambulatory Visit | Attending: Orthopedic Surgery | Admitting: Orthopedic Surgery

## 2021-11-01 ENCOUNTER — Other Ambulatory Visit: Payer: Self-pay

## 2021-11-01 ENCOUNTER — Encounter (HOSPITAL_COMMUNITY): Payer: Self-pay

## 2021-11-01 NOTE — H&P (Signed)
Admission history and physical for outpatient surgery  Chief complaint pain right knee after surgery, open wound, weakness extension right leg  History the patient is a 56 years old he is status post right quadriceps tendon repair September 04, 2021.  Postoperatively he developed issues with the distal portion of his wound and despite bracing and eventual aggressive range of motion he presented to the clinic last week with ongoing dehiscence of the distal portion of the wound and lack of extension  It should be noted that his MRI initially showed that he had a partially torn quadriceps tendon however at the time of surgery the tendon looked to be intact with tendinosis consistent with a jumper's knee type picture.  The tendon was taken down and repaired fully and the patellar tendon was also explored and found to be intact  In the office the quadriceps tendon repair appears to be intact however there appears to be a defect in the patellar tendon especially noticed with the knee in flexion  This is very unclear as to why he is still having extensor mechanism weakness, as this was explored at the time of surgery and was not ruptured even the quadriceps tendon despite having tendinosis did not have a frank rupture  In any event his wound has dehisced he is having pain this will require the following  Right knee:  Incision and drainage of the wound, excision of eschar   Exploration of patellar tendon   Cast with window   Postop antibiotics for 4 weeks  Review of systems although the patient is undergoing treatment and work-up for cancer he does not exhibit any new or unexpected weight loss blurred vision chest pain shortness of breath other than the wheezing from smoking denies heartburn frequency numbness or tingling nervousness easy bleeding excessive thirst or reaction to environmental allergens   Past Medical History:  Diagnosis Date   Alcohol abuse    Anxiety    Back pain    CVA  (cerebral infarction)    Depression    Diabetes mellitus    Hypertension    Migraines    Stroke [facility])    Past Surgical History:  Procedure Laterality Date   FOOT SURGERY     IR 3D INDEPENDENT WKST  10/11/2021   IR ANGIOGRAM SELECTIVE EACH ADDITIONAL VESSEL  10/11/2021   IR ANGIOGRAM SELECTIVE EACH ADDITIONAL VESSEL  10/11/2021   IR ANGIOGRAM SELECTIVE EACH ADDITIONAL VESSEL  10/11/2021   IR ANGIOGRAM SELECTIVE EACH ADDITIONAL VESSEL  10/11/2021   IR ANGIOGRAM SELECTIVE EACH ADDITIONAL VESSEL  10/11/2021   IR ANGIOGRAM SELECTIVE EACH ADDITIONAL VESSEL  10/11/2021   IR ANGIOGRAM SELECTIVE EACH ADDITIONAL VESSEL  10/25/2021   IR ANGIOGRAM SELECTIVE EACH ADDITIONAL VESSEL  10/25/2021   IR ANGIOGRAM VISCERAL SELECTIVE  10/11/2021   IR EMBO TUMOR ORGAN ISCHEMIA INFARCT INC GUIDE ROADMAPPING  10/25/2021   IR EMBO TUMOR ORGAN ISCHEMIA INFARCT INC GUIDE ROADMAPPING  10/25/2021   IR RADIOLOGIST EVAL & MGMT  08/23/2021   IR US GUIDE VASC ACCESS RIGHT  10/11/2021   IR US GUIDE VASC ACCESS RIGHT  10/25/2021   MANDIBLE SURGERY     QUADRICEPS TENDON REPAIR Right 09/04/2021   Procedure: REPAIR QUADRICEP TENDON;  Surgeon: Carole Civil, MD;  Location: [facility];  Service: Orthopedics;  Laterality: Right;   Family History  Problem Relation Age of Onset   Heart failure Mother    Stroke Father    Stroke Sister    Cancer Sister  breast cancer   Colon cancer Neg Hx    Liver cancer Neg Hx    Social History   Tobacco Use   Smoking status: Every Day    Packs/day: 2.00    Years: 20.00    Pack years: 40.00    Types: Cigarettes   Smokeless tobacco: Never  Vaping Use   Vaping Use: Never used  Substance Use Topics   Alcohol use: Not Currently    Comment: Quit 11 months ago. Used to drink about 6 pack a day. (documented 06/06/21)   Drug use: Yes    Types: Marijuana    Comment: occas   Allergies  Allergen Reactions   Ultram [Tramadol] Hives   Benadryl [Diphenhydramine] Itching and Rash    General  appearance: Thin frame no deformities  He is oriented to person place and time  His mood is pleasant his affect is normal  He walks with a brace and a walker with a limp on the right side  His right knee has dehiscence of the wound inferiorly there is eschar around the wound there is clear drainage he has no active extension The knee is in flexion there is a palpable defect at the inferior pole the patella none of the quadricep the knee is otherwise stable muscle strength is weak in terms of extension quadriceps muscle tone is normal skin as described the distal pulses are intact there is no edema temperature is warm he has no lymphadenopathy in the groin related sensation in the right leg is normal without pathologic reflexes and his balance is reasonable saying that he can walk with the brace a week right leg and a assistive device  Diagnosis  Wound dehiscence right knee status post quadriceps tendon repair 09/04/2021 Extensor mechanism weakness possible patella tendon rupture  Plan  Right knee:  Incision and drainage of the wound, excision of eschar   Exploration of patellar tendon   Cast with window   Postop antibiotics for 4 weeks

## 2021-11-02 ENCOUNTER — Ambulatory Visit (HOSPITAL_BASED_OUTPATIENT_CLINIC_OR_DEPARTMENT_OTHER): Payer: Medicaid Other | Admitting: Anesthesiology

## 2021-11-02 ENCOUNTER — Other Ambulatory Visit: Payer: Self-pay

## 2021-11-02 ENCOUNTER — Ambulatory Visit (HOSPITAL_COMMUNITY): Payer: Medicaid Other | Admitting: Anesthesiology

## 2021-11-02 ENCOUNTER — Encounter (HOSPITAL_COMMUNITY)
Admission: RE | Disposition: A | Discharge status: Home / Self Care | Payer: Self-pay | Source: Home / Self Care | Attending: Orthopedic Surgery

## 2021-11-02 ENCOUNTER — Ambulatory Visit (HOSPITAL_COMMUNITY)
Admission: RE | Admit: 2021-11-02 | Discharge: 2021-11-02 | Disposition: A | Discharge status: Home / Self Care | Payer: Medicaid Other | Attending: Orthopedic Surgery | Admitting: Orthopedic Surgery

## 2021-11-02 ENCOUNTER — Encounter (HOSPITAL_COMMUNITY): Payer: Self-pay | Admitting: Orthopedic Surgery

## 2021-11-02 DIAGNOSIS — E119 Type 2 diabetes mellitus without complications: Secondary | ICD-10-CM

## 2021-11-02 DIAGNOSIS — I10 Essential (primary) hypertension: Secondary | ICD-10-CM | POA: Diagnosis not present

## 2021-11-02 DIAGNOSIS — K219 Gastro-esophageal reflux disease without esophagitis: Secondary | ICD-10-CM | POA: Diagnosis not present

## 2021-11-02 DIAGNOSIS — Z7984 Long term (current) use of oral hypoglycemic drugs: Secondary | ICD-10-CM

## 2021-11-02 DIAGNOSIS — T8149XA Infection following a procedure, other surgical site, initial encounter: Secondary | ICD-10-CM | POA: Diagnosis not present

## 2021-11-02 DIAGNOSIS — Z8673 Personal history of transient ischemic attack (TIA), and cerebral infarction without residual deficits: Secondary | ICD-10-CM | POA: Insufficient documentation

## 2021-11-02 DIAGNOSIS — T8141XA Infection following a procedure, superficial incisional surgical site, initial encounter: Secondary | ICD-10-CM | POA: Insufficient documentation

## 2021-11-02 DIAGNOSIS — F32A Depression, unspecified: Secondary | ICD-10-CM | POA: Insufficient documentation

## 2021-11-02 DIAGNOSIS — T8142XA Infection following a procedure, deep incisional surgical site, initial encounter: Secondary | ICD-10-CM

## 2021-11-02 DIAGNOSIS — J449 Chronic obstructive pulmonary disease, unspecified: Secondary | ICD-10-CM | POA: Diagnosis not present

## 2021-11-02 DIAGNOSIS — K7460 Unspecified cirrhosis of liver: Secondary | ICD-10-CM | POA: Diagnosis not present

## 2021-11-02 DIAGNOSIS — F419 Anxiety disorder, unspecified: Secondary | ICD-10-CM | POA: Diagnosis not present

## 2021-11-02 DIAGNOSIS — Y831 Surgical operation with implant of artificial internal device as the cause of abnormal reaction of the patient, or of later complication, without mention of misadventure at the time of the procedure: Secondary | ICD-10-CM | POA: Diagnosis not present

## 2021-11-02 HISTORY — PX: CAST APPLICATION: SHX380

## 2021-11-02 HISTORY — PX: TENDON EXPLORATION: SHX5112

## 2021-11-02 HISTORY — PX: INCISION AND DRAINAGE: SHX5863

## 2021-11-02 LAB — GLUCOSE, CAPILLARY
Glucose-Capillary: 177 mg/dL — ABNORMAL HIGH (ref 70–99)
Glucose-Capillary: 112 mg/dL — ABNORMAL HIGH (ref 70–99)

## 2021-11-02 SURGERY — INCISION AND DRAINAGE
Anesthesia: General | Site: Knee | Laterality: Right

## 2021-11-02 MED ORDER — DEXAMETHASONE SODIUM PHOSPHATE 10 MG/ML IJ SOLN
INTRAMUSCULAR | Status: AC
  Filled 2021-11-02: qty 1

## 2021-11-02 MED ORDER — SODIUM CHLORIDE 0.9 % IR SOLN
Status: DC | PRN
Start: 2021-11-02 — End: 2021-11-02
  Administered 2021-11-02: 1000 mL

## 2021-11-02 MED ORDER — OXYCODONE HCL 5 MG PO TABS
10.0000 mg | ORAL_TABLET | Freq: Once | ORAL | Status: AC
  Administered 2021-11-02: 10 mg via ORAL
  Filled 2021-11-02: qty 2

## 2021-11-02 MED ORDER — FENTANYL CITRATE (PF) 250 MCG/5ML IJ SOLN
INTRAMUSCULAR | Status: AC
  Filled 2021-11-02: qty 5

## 2021-11-02 MED ORDER — BUPIVACAINE-EPINEPHRINE (PF) 0.5% -1:200000 IJ SOLN
INTRAMUSCULAR | Status: AC
  Filled 2021-11-02: qty 30

## 2021-11-02 MED ORDER — ONDANSETRON HCL 4 MG/2ML IJ SOLN
INTRAMUSCULAR | Status: AC
  Filled 2021-11-02: qty 2

## 2021-11-02 MED ORDER — MIDAZOLAM HCL 5 MG/5ML IJ SOLN
INTRAMUSCULAR | Status: DC | PRN
  Administered 2021-11-02: 2 mg via INTRAVENOUS

## 2021-11-02 MED ORDER — ONDANSETRON HCL 4 MG/2ML IJ SOLN
4.0000 mg | Freq: Once | INTRAMUSCULAR | Status: AC
  Administered 2021-11-02: 4 mg via INTRAVENOUS

## 2021-11-02 MED ORDER — SUGAMMADEX SODIUM 500 MG/5ML IV SOLN
INTRAVENOUS | Status: AC
  Filled 2021-11-02: qty 10

## 2021-11-02 MED ORDER — LIDOCAINE HCL (PF) 2 % IJ SOLN
INTRAMUSCULAR | Status: AC
  Filled 2021-11-02: qty 5

## 2021-11-02 MED ORDER — BUPIVACAINE-EPINEPHRINE 0.5% -1:200000 IJ SOLN
INTRAMUSCULAR | Status: DC | PRN
  Administered 2021-11-02: 30 mL

## 2021-11-02 MED ORDER — CEFAZOLIN SODIUM-DEXTROSE 2-4 GM/100ML-% IV SOLN
INTRAVENOUS | Status: AC
  Filled 2021-11-02: qty 100

## 2021-11-02 MED ORDER — DEXAMETHASONE SODIUM PHOSPHATE 10 MG/ML IJ SOLN
INTRAMUSCULAR | Status: DC | PRN
Start: 2021-11-02 — End: 2021-11-02
  Administered 2021-11-02: 5 mg via INTRAVENOUS

## 2021-11-02 MED ORDER — LIDOCAINE 2% (20 MG/ML) 5 ML SYRINGE
INTRAMUSCULAR | Status: DC | PRN
Start: 2021-11-02 — End: 2021-11-02
  Administered 2021-11-02: 60 mg via INTRAVENOUS

## 2021-11-02 MED ORDER — PHENYLEPHRINE 40 MCG/ML (10ML) SYRINGE FOR IV PUSH (FOR BLOOD PRESSURE SUPPORT)
PREFILLED_SYRINGE | INTRAVENOUS | Status: AC
  Filled 2021-11-02: qty 10

## 2021-11-02 MED ORDER — PROPOFOL 10 MG/ML IV BOLUS
INTRAVENOUS | Status: AC
  Filled 2021-11-02: qty 20

## 2021-11-02 MED ORDER — OXYCODONE HCL 5 MG PO TABS
5.0000 mg | ORAL_TABLET | ORAL | 0 refills | Status: AC | PRN
Start: 2021-11-02 — End: 2021-11-07

## 2021-11-02 MED ORDER — PHENYLEPHRINE 40 MCG/ML (10ML) SYRINGE FOR IV PUSH (FOR BLOOD PRESSURE SUPPORT)
PREFILLED_SYRINGE | INTRAVENOUS | Status: DC | PRN
  Administered 2021-11-02 (×2): 80 ug via INTRAVENOUS

## 2021-11-02 MED ORDER — LACTATED RINGERS IV SOLN: INTRAVENOUS | Status: DC

## 2021-11-02 MED ORDER — CHLORHEXIDINE GLUCONATE 0.12 % MT SOLN
15.0000 mL | Freq: Once | OROMUCOSAL | Status: AC
  Administered 2021-11-02: 15 mL via OROMUCOSAL

## 2021-11-02 MED ORDER — PROPOFOL 10 MG/ML IV BOLUS
INTRAVENOUS | Status: DC | PRN
  Administered 2021-11-02: 150 mg via INTRAVENOUS

## 2021-11-02 MED ORDER — FENTANYL CITRATE (PF) 250 MCG/5ML IJ SOLN
INTRAMUSCULAR | Status: DC | PRN
Start: 2021-11-02 — End: 2021-11-02
  Administered 2021-11-02 (×3): 25 ug via INTRAVENOUS

## 2021-11-02 MED ORDER — FENTANYL CITRATE PF 50 MCG/ML IJ SOSY
25.0000 ug | PREFILLED_SYRINGE | INTRAMUSCULAR | Status: DC | PRN
  Administered 2021-11-02 (×2): 50 ug via INTRAVENOUS
  Filled 2021-11-02 (×2): qty 1

## 2021-11-02 MED ORDER — ORAL CARE MOUTH RINSE: 15.0000 mL | Freq: Once | OROMUCOSAL | Status: AC

## 2021-11-02 MED ORDER — ROCURONIUM BROMIDE 10 MG/ML (PF) SYRINGE
PREFILLED_SYRINGE | INTRAVENOUS | Status: AC
  Filled 2021-11-02: qty 10

## 2021-11-02 MED ORDER — ONDANSETRON HCL 4 MG/2ML IJ SOLN
INTRAMUSCULAR | Status: DC | PRN
  Administered 2021-11-02: 4 mg via INTRAVENOUS

## 2021-11-02 MED ORDER — CEFAZOLIN SODIUM-DEXTROSE 2-4 GM/100ML-% IV SOLN
2.0000 g | INTRAVENOUS | Status: AC
  Administered 2021-11-02: 2 g via INTRAVENOUS

## 2021-11-02 MED ORDER — ONDANSETRON HCL 4 MG/2ML IJ SOLN
4.0000 mg | Freq: Once | INTRAMUSCULAR | Status: DC | PRN
  Filled 2021-11-02: qty 2

## 2021-11-02 MED ORDER — MIDAZOLAM HCL 2 MG/2ML IJ SOLN
INTRAMUSCULAR | Status: AC
  Filled 2021-11-02: qty 2

## 2021-11-02 MED ORDER — MEPERIDINE HCL 50 MG/ML IJ SOLN: 6.2500 mg | INTRAMUSCULAR | Status: DC | PRN

## 2021-11-02 SURGICAL SUPPLY — 46 items
APL PRP STRL LF DISP 70% ISPRP (MISCELLANEOUS) ×1
BANDAGE ELASTIC 6 VELCRO NS (GAUZE/BANDAGES/DRESSINGS) ×1 IMPLANT
BANDAGE ESMARK 6X9 LF (GAUZE/BANDAGES/DRESSINGS) ×1 IMPLANT
BLADE SURG 15 STRL LF DISP TIS (BLADE) IMPLANT
BLADE SURG 15 STRL SS (BLADE) ×2
BNDG CMPR 82X61 PLY HI ABS (GAUZE/BANDAGES/DRESSINGS) ×1
BNDG CMPR 9X6 STRL LF SNTH (GAUZE/BANDAGES/DRESSINGS) ×1
BNDG COHESIVE 4X5 TAN STRL (GAUZE/BANDAGES/DRESSINGS) ×1 IMPLANT
BNDG CONFORM 6X.82 1P STRL (GAUZE/BANDAGES/DRESSINGS) ×1 IMPLANT
BNDG ESMARK 6X9 LF (GAUZE/BANDAGES/DRESSINGS) ×2
CHLORAPREP W/TINT 26 (MISCELLANEOUS) ×2 IMPLANT
CLOTH BEACON ORANGE TIMEOUT ST (SAFETY) ×2 IMPLANT
COVER LIGHT HANDLE STERIS (MISCELLANEOUS) ×4 IMPLANT
ELECT REM PT RETURN 9FT ADLT (ELECTROSURGICAL) ×2
ELECTRODE REM PT RTRN 9FT ADLT (ELECTROSURGICAL) ×1 IMPLANT
GAUZE SPONGE 4X4 12PLY STRL (GAUZE/BANDAGES/DRESSINGS) ×2 IMPLANT
GAUZE XEROFORM 5X9 LF (GAUZE/BANDAGES/DRESSINGS) ×1 IMPLANT
GLOVE SS N UNI LF 8.5 STRL (GLOVE) ×2 IMPLANT
GLOVE SURG POLYISO LF SZ6.5 (GLOVE) ×1 IMPLANT
GLOVE SURG POLYISO LF SZ7 (GLOVE) ×2 IMPLANT
GLOVE SURG POLYISO LF SZ8 (GLOVE) ×3 IMPLANT
GLOVE SURG UNDER POLY LF SZ7 (GLOVE) ×4 IMPLANT
GOWN STRL REUS W/TWL LRG LVL3 (GOWN DISPOSABLE) ×4 IMPLANT
GOWN STRL REUS W/TWL XL LVL3 (GOWN DISPOSABLE) ×2 IMPLANT
INST SET MINOR BONE (KITS) ×2 IMPLANT
KIT TURNOVER KIT A (KITS) ×2 IMPLANT
MANIFOLD NEPTUNE II (INSTRUMENTS) ×2 IMPLANT
NDL HYPO 21X1.5 SAFETY (NEEDLE) ×1 IMPLANT
NEEDLE HYPO 21X1.5 SAFETY (NEEDLE) ×2 IMPLANT
NS IRRIG 1000ML POUR BTL (IV SOLUTION) ×2 IMPLANT
PACK BASIC LIMB (CUSTOM PROCEDURE TRAY) ×2 IMPLANT
PAD ABD 5X9 TENDERSORB (GAUZE/BANDAGES/DRESSINGS) ×3 IMPLANT
PAD ARMBOARD 7.5X6 YLW CONV (MISCELLANEOUS) ×2 IMPLANT
PADDING CAST COTTON 6X4 STRL (CAST SUPPLIES) ×5 IMPLANT
PENCIL HANDSWITCHING (ELECTRODE) ×1 IMPLANT
SET BASIN LINEN APH (SET/KITS/TRAYS/PACK) ×2 IMPLANT
SPONGE T-LAP 18X18 ~~LOC~~+RFID (SPONGE) ×3 IMPLANT
STAPLER VISISTAT 35W (STAPLE) ×2 IMPLANT
SUT ETHIBOND NAB OS 4 #2 30IN (SUTURE) ×1 IMPLANT
SUT MON AB 0 CT1 (SUTURE) ×2 IMPLANT
SUT VIC AB 1 CT1 27 (SUTURE) ×4
SUT VIC AB 1 CT1 27XBRD ANTBC (SUTURE) IMPLANT
SYR 30ML LL (SYRINGE) ×2 IMPLANT
SYR BULB IRRIG 60ML STRL (SYRINGE) ×2 IMPLANT
TAPE CAST 4X4 WHT DELT L NS LF (CAST SUPPLIES) ×3 IMPLANT
YANKAUER SUCT BULB TIP NO VENT (SUCTIONS) ×2 IMPLANT

## 2021-11-02 NOTE — Op Note (Signed)
11/02/2021  12:55 PM  PATIENT:  Danny [name]  56 y.o. male  PRE-OPERATIVE DIAGNOSIS: Extensor mechanism dysfunction possible quadriceps tendon rupture rerupture, patellar tendon rupture wound infection right knee  POST-OPERATIVE DIAGNOSIS:  WOUND INFECTION OF RIGHT KNEE  PROCEDURE:  Procedure(s): INCISION AND DRAINAGE RIGHT KNEE (Right) CAST APPLICATION (Right) QUADRICEPS TENDON AND PATELLAR TENDON EXPLORATION (Right)  Findings intact quadriceps tendon repair intact patellar tendon wound infection  The patient was cleared for surgery and the surgical site was confirmed as the right knee and marked patient was taken to surgery.  After general anesthesia in the supine position his right leg was prepped and draped  After timeout the limb was exsanguinated with a 4 inch Esmarch tourniquet was elevated to 250 mmHg  I went through the same incision I did make it as long.  I explored the quadriceps tendon I made a rent in the medial retinaculum as well as visual and palpable inspection the repair was intact  I then went to the patellar tendon divided peritenon and saw that the patella tendon was intact.  I made a rent where the medial arthroscopy portal would be and then palpated and visualized intact patellar tendon  I debrided the wound irrigated it thoroughly debriding all necrotic edges  I then closed the incision with 0 Monocryl left the infected area open and packed it with saline gauze and then dry and then 4 x 4's and then ABD and then Kling and then put a long-leg cast on  Extubated and taken recovery room in stable condition  Postop plan make a window over the wound to check that and pack it and then keep the cast on for 6 weeks  Weight-bear as tolerated in the cast  Note we injected the soft tissues with 30 cc Marcaine with epinephrine   SURGEON:  Surgeon(s) and Role:    Carole Civil, MD - Primary  PHYSICIAN ASSISTANT:   ASSISTANTS: Fulton Mole  ANESTHESIA:   general  EBL:  none   BLOOD ADMINISTERED:none  DRAINS: none   LOCAL MEDICATIONS USED:  MARCAINE     SPECIMEN:  No Specimen  DISPOSITION OF SPECIMEN:  N/A  COUNTS:  YES  TOURNIQUET:   Total Tourniquet Time Documented: Thigh (Right) - 30 minutes Total: Thigh (Right) - 30 minutes   DICTATION: .Dragon Dictation  PLAN OF CARE: Discharge to home after PACU  PATIENT DISPOSITION:  PACU - hemodynamically stable.   Delay start of Pharmacological VTE agent (>24hrs) due to surgical blood loss or risk of bleeding: not applicable

## 2021-11-02 NOTE — Interval H&P Note (Signed)
History and Physical Interval Note:  11/02/2021 11:23 AM  Danny [name]  has presented today for surgery, with the diagnosis of PATELLA TENDON TEAR AND INCISION AND DEBRIDEMENT OF WOUND RIGHT KNEE.  The various methods of treatment have been discussed with the patient and family. After consideration of risks, benefits and other options for treatment, the patient has consented to  Procedure(s): PATELLA TENDON REPAIR AND INCISION AND DEBRIDEMENT OF WOUND RIGHT KNEE (Right) as a surgical intervention.  And cast The patient's history has been reviewed, patient examined, no change in status, stable for surgery.  I have reviewed the patient's chart and labs.  Questions were answered to the patient's satisfaction.     Arther Abbott

## 2021-11-02 NOTE — Anesthesia Procedure Notes (Signed)
Procedure Name: LMA Insertion Date/Time: 11/02/2021 11:53 AM Performed by: Myna Bright, CRNA Pre-anesthesia Checklist: Emergency Drugs available, Patient identified, Suction available and Patient being monitored Patient Re-evaluated:Patient Re-evaluated prior to induction Oxygen Delivery Method: Circle system utilized Preoxygenation: Pre-oxygenation with 100% oxygen Induction Type: IV induction Ventilation: Mask ventilation without difficulty LMA: LMA inserted LMA Size: 4.0 Tube type: Oral Number of attempts: 1 Placement Confirmation: positive ETCO2 and breath sounds checked- equal and bilateral Tube secured with: Tape Dental Injury: Teeth and Oropharynx as per pre-operative assessment

## 2021-11-02 NOTE — Anesthesia Preprocedure Evaluation (Signed)
Anesthesia Evaluation  Patient identified by MRN, date of birth, ID band Patient awake    Reviewed: Allergy & Precautions, NPO status , Patient's Chart, lab work & pertinent test results  History of Anesthesia Complications Negative for: history of anesthetic complications  Airway Mallampati: II  TM Distance: >3 FB Neck ROM: Full    Dental  (+) Dental Advisory Given, Poor Dentition, Chipped   Pulmonary COPD, Current Smoker,    Pulmonary exam normal breath sounds clear to auscultation       Cardiovascular hypertension, Pt. on medications Normal cardiovascular exam Rhythm:Regular Rate:Normal     Neuro/Psych  Headaches, PSYCHIATRIC DISORDERS Anxiety Depression CVA    GI/Hepatic GERD  Medicated,(+) Cirrhosis     substance abuse  alcohol use and marijuana use, Hepatitis -, CHepatocellular carcinoma    Endo/Other  diabetes, Well Controlled, Type 2, Oral Hypoglycemic Agents  Renal/GU      Musculoskeletal negative musculoskeletal ROS (+)   Abdominal   Peds  Hematology negative hematology ROS (+)   Anesthesia Other Findings   Reproductive/Obstetrics negative OB ROS                             Anesthesia Physical Anesthesia Plan  ASA: 3  Anesthesia Plan: General   Post-op Pain Management: Dilaudid IV   Induction: Intravenous  PONV Risk Score and Plan: 2 and Ondansetron and Dexamethasone  Airway Management Planned: LMA  Additional Equipment:   Intra-op Plan:   Post-operative Plan: Extubation in OR  Informed Consent: I have reviewed the patients History and Physical, chart, labs and discussed the procedure including the risks, benefits and alternatives for the proposed anesthesia with the patient or authorized representative who has indicated his/her understanding and acceptance.     Dental advisory given  Plan Discussed with: CRNA and Surgeon  Anesthesia Plan Comments:          Anesthesia Quick Evaluation

## 2021-11-02 NOTE — Transfer of Care (Signed)
Immediate Anesthesia Transfer of Care Note  Patient: Danny [name]  Procedure(s) Performed: INCISION AND DRAINAGE RIGHT KNEE (Right: Knee) CAST APPLICATION (Right: Knee) QUADRICEPS TENDON AND PATELLAR TENDON EXPLORATION (Right: Knee)  Patient Location: PACU  Anesthesia Type:General  Level of Consciousness: drowsy  Airway & Oxygen Therapy: Patient Spontanous Breathing and Patient connected to face mask oxygen  Post-op Assessment: Report given to RN and Post -op Vital signs reviewed and stable  Post vital signs: Reviewed and stable  Last Vitals:  Vitals Value Taken Time  BP    Temp    Pulse 74 11/02/21 1256  Resp 5 11/02/21 1256  SpO2 99 % 11/02/21 1256  Vitals shown include unvalidated device data.  Last Pain:  Vitals:   11/02/21 1115  PainSc: 8          Complications: No notable events documented.

## 2021-11-02 NOTE — Brief Op Note (Signed)
11/02/2021  12:55 PM  PATIENT:  Sharyn Dross  56 y.o. male  PRE-OPERATIVE DIAGNOSIS: Extensor mechanism dysfunction possible quadriceps tendon rupture rerupture, patellar tendon rupture wound infection right knee  POST-OPERATIVE DIAGNOSIS:  WOUND INFECTION OF RIGHT KNEE  PROCEDURE:  Procedure(s): INCISION AND DRAINAGE RIGHT KNEE (Right) CAST APPLICATION (Right) QUADRICEPS TENDON AND PATELLAR TENDON EXPLORATION (Right)  Findings intact quadriceps tendon repair intact patellar tendon wound infection  The patient was cleared for surgery and the surgical site was confirmed as the right knee and marked patient was taken to surgery.  After general anesthesia in the supine position his right leg was prepped and draped  After timeout the limb was exsanguinated with a 4 inch Esmarch tourniquet was elevated to 250 mmHg  I went through the same incision I did make it as long.  I explored the quadriceps tendon I made a rent in the medial retinaculum as well as visual and palpable inspection the repair was intact  I then went to the patellar tendon divided peritenon and saw that the patella tendon was intact.  I made a rent where the medial arthroscopy portal would be and then palpated and visualized intact patellar tendon  I debrided the wound irrigated it thoroughly debriding all necrotic edges  I then closed the incision with 0 Monocryl left the infected area open and packed it with saline gauze and then dry and then 4 x 4's and then ABD and then Kling and then put a long-leg cast on  Extubated and taken recovery room in stable condition  Postop plan make a window over the wound to check that and pack it and then keep the cast on for 6 weeks  Weight-bear as tolerated in the cast  Note we injected the soft tissues with 30 cc Marcaine with epinephrine   SURGEON:  Surgeon(s) and Role:    Carole Civil, MD - Primary  PHYSICIAN ASSISTANT:   ASSISTANTS: Fulton Mole  ANESTHESIA:   general  EBL:  none   BLOOD ADMINISTERED:none  DRAINS: none   LOCAL MEDICATIONS USED:  MARCAINE     SPECIMEN:  No Specimen  DISPOSITION OF SPECIMEN:  N/A  COUNTS:  YES  TOURNIQUET:   Total Tourniquet Time Documented: Thigh (Right) - 30 minutes Total: Thigh (Right) - 30 minutes   DICTATION: .Dragon Dictation  PLAN OF CARE: Discharge to home after PACU  PATIENT DISPOSITION:  PACU - hemodynamically stable.   Delay start of Pharmacological VTE agent (>24hrs) due to surgical blood loss or risk of bleeding: not applicable

## 2021-11-02 NOTE — Anesthesia Postprocedure Evaluation (Signed)
Anesthesia Post Note  Patient: Danny [name]  Procedure(s) Performed: INCISION AND DRAINAGE RIGHT KNEE (Right: Knee) CAST APPLICATION (Right: Knee) QUADRICEPS TENDON AND PATELLAR TENDON EXPLORATION (Right: Knee)  Patient location during evaluation: Phase II Anesthesia Type: General Level of consciousness: awake and alert and oriented Pain management: pain level controlled Vital Signs Assessment: post-procedure vital signs reviewed and stable Respiratory status: spontaneous breathing, nonlabored ventilation and respiratory function stable Cardiovascular status: blood pressure returned to baseline and stable Postop Assessment: no apparent nausea or vomiting Anesthetic complications: no   No notable events documented.   Last Vitals:  Vitals:   11/02/21 1415 11/02/21 1421  BP: 137/67   Pulse: 76   Resp: 15   Temp:  36.6 C  SpO2: 96%     Last Pain:  Vitals:   11/02/21 1421  TempSrc: Oral  PainSc:                  Jayel Inks C Maceo Hernan

## 2021-11-06 ENCOUNTER — Encounter (HOSPITAL_COMMUNITY): Payer: Self-pay | Admitting: Orthopedic Surgery

## 2021-11-07 ENCOUNTER — Ambulatory Visit (INDEPENDENT_AMBULATORY_CARE_PROVIDER_SITE_OTHER): Payer: Medicaid Other | Admitting: Orthopedic Surgery

## 2021-11-07 ENCOUNTER — Other Ambulatory Visit: Payer: Self-pay

## 2021-11-07 DIAGNOSIS — T8149XA Infection following a procedure, other surgical site, initial encounter: Secondary | ICD-10-CM

## 2021-11-07 DIAGNOSIS — T8189XD Other complications of procedures, not elsewhere classified, subsequent encounter: Secondary | ICD-10-CM

## 2021-11-07 MED ORDER — SULFAMETHOXAZOLE-TRIMETHOPRIM 800-160 MG PO TABS: 1.0000 | ORAL_TABLET | Freq: Two times a day (BID) | ORAL | 0 refills | Status: DC

## 2021-11-07 MED ORDER — OXYCODONE-ACETAMINOPHEN 10-325 MG PO TABS: 1.0000 | ORAL_TABLET | ORAL | 0 refills | Status: DC | PRN

## 2021-11-07 NOTE — Progress Notes (Signed)
Chief Complaint  ?Patient presents with  ? Routine Post Op  ?  I/D RT knee  ?DOS 11/02/21  ? ? ?Encounter Diagnoses  ?Name Primary?  ? Non-healing surgical wound, subsequent encounter   ? Cellulitis, wound, post-operative Yes  ? ? ?56 year old male status post repair of quadriceps tendon followed by incision and drainage right knee exploration of tendon repair on February 24 initial surgery was weeks before that ? ?We found both quadriceps and patellar tendons intact he did have a wound infection. ? ?He ran out of his antibiotics over about a week late we were using doxycycline it does not seem like it was helping some switching him to Bactrim ? ?We did a dressing changed and made a cast window ? ?I will see him on the seventh 2 change the dressing again and see how the antibiotic changes doing ? ?He was having increased pain so I increased his pain medication ? ?Meds ordered this encounter  ?Medications  ? sulfamethoxazole-trimethoprim (BACTRIM DS) 800-160 MG tablet  ?  Sig: Take 1 tablet by mouth 2 (two) times daily.  ?  Dispense:  56 tablet  ?  Refill:  0  ? oxyCODONE-acetaminophen (PERCOCET) 10-325 MG tablet  ?  Sig: Take 1 tablet by mouth every 4 (four) hours as needed for pain.  ?  Dispense:  30 tablet  ?  Refill:  0  ? ? ?

## 2021-11-08 ENCOUNTER — Encounter: Payer: Medicaid Other | Admitting: Orthopedic Surgery

## 2021-11-12 ENCOUNTER — Encounter: Payer: Medicaid Other | Admitting: Orthopedic Surgery

## 2021-11-13 ENCOUNTER — Other Ambulatory Visit: Payer: Self-pay

## 2021-11-13 ENCOUNTER — Ambulatory Visit (INDEPENDENT_AMBULATORY_CARE_PROVIDER_SITE_OTHER): Payer: Medicaid Other | Admitting: Orthopedic Surgery

## 2021-11-13 DIAGNOSIS — T8189XD Other complications of procedures, not elsewhere classified, subsequent encounter: Secondary | ICD-10-CM

## 2021-11-13 MED ORDER — OXYCODONE-ACETAMINOPHEN 10-325 MG PO TABS: 1.0000 | ORAL_TABLET | ORAL | 0 refills | Status: DC | PRN

## 2021-11-13 NOTE — Progress Notes (Signed)
Chief Complaint  ?Patient presents with  ? Routine Post Op  ?  DOS 11/02/21  I&D ?RT thigh  ? ?Postop day #11 ? ? ?Postop wound check check incision and open wound right knee ? ?Wet-to-dry dressings under the cast with cast window ? ?Patient is on Bactrim now twice a day ? ?Wound is approximately 4 cm long by about 3 cm wide superficial ? ?It is a good granulation bed looks clean ? ?Remaining staples were removed ? ?Continue Bactrim ? ?Wet-to-dry dressing applied ? ?Cast window reapplied ? ?Follow-up in a week for dressing change and wound check ?Meds ordered this encounter  ?Medications  ? oxyCODONE-acetaminophen (PERCOCET) 10-325 MG tablet  ?  Sig: Take 1 tablet by mouth every 4 (four) hours as needed for pain.  ?  Dispense:  30 tablet  ?  Refill:  0  ? ? ?

## 2021-11-19 ENCOUNTER — Other Ambulatory Visit: Payer: Self-pay

## 2021-11-19 ENCOUNTER — Encounter: Payer: Self-pay | Admitting: Orthopedic Surgery

## 2021-11-19 ENCOUNTER — Ambulatory Visit (INDEPENDENT_AMBULATORY_CARE_PROVIDER_SITE_OTHER): Payer: Medicaid Other | Admitting: Orthopedic Surgery

## 2021-11-19 DIAGNOSIS — S76111D Strain of right quadriceps muscle, fascia and tendon, subsequent encounter: Secondary | ICD-10-CM | POA: Diagnosis not present

## 2021-11-19 DIAGNOSIS — T8149XA Infection following a procedure, other surgical site, initial encounter: Secondary | ICD-10-CM

## 2021-11-19 NOTE — Telephone Encounter (Signed)
Pt was up all night with pain. He also has a sore developing on the back of his ankle at the bottom of the cast. Please advise.  ?

## 2021-11-19 NOTE — Progress Notes (Signed)
Chief Complaint  ?Patient presents with  ? cast complaints  ?  Pt states that he feels like there is a piece of fiberglass or something in the window that was cut in his long leg cast and a sore is forming on the back of his ankle at the bottom of the cast  ? ? ?Status post incision and drainage nonhealing wound postop wound right knee ? ?Cast removed ? ?Patient has a rubbing reddened area on the Achilles area and then his wound still appears to be very wet and moist ? ?It has not gotten any bigger some of the erythema has resolved now that the staples are out ? ?I changed his cast ? ?He will come back on Wednesday I will take that cast off put another wet-to-dry dressing on and send him to wound care therapy to get an absorptive dressing to try to draw some of the fluid out and keep the dressing and wound dry ? ?We will put him in a knee brace instead of the cast ?

## 2021-11-19 NOTE — Telephone Encounter (Signed)
All cast and wound problems are immediate appointmenst dont ask just bring them in

## 2021-11-21 ENCOUNTER — Ambulatory Visit (INDEPENDENT_AMBULATORY_CARE_PROVIDER_SITE_OTHER): Payer: Medicaid Other | Admitting: Orthopedic Surgery

## 2021-11-21 ENCOUNTER — Other Ambulatory Visit: Payer: Self-pay

## 2021-11-21 ENCOUNTER — Ambulatory Visit
Admission: RE | Admit: 2021-11-21 | Discharge: 2021-11-21 | Disposition: A | Discharge status: Home / Self Care | Payer: Medicaid Other | Source: Ambulatory Visit | Attending: Interventional Radiology | Admitting: Interventional Radiology

## 2021-11-21 ENCOUNTER — Ambulatory Visit (HOSPITAL_COMMUNITY): Payer: Medicaid Other | Attending: Orthopedic Surgery | Admitting: Physical Therapy

## 2021-11-21 ENCOUNTER — Encounter (HOSPITAL_COMMUNITY): Payer: Self-pay | Admitting: Physical Therapy

## 2021-11-21 DIAGNOSIS — T8189XD Other complications of procedures, not elsewhere classified, subsequent encounter: Secondary | ICD-10-CM

## 2021-11-21 DIAGNOSIS — R2689 Other abnormalities of gait and mobility: Secondary | ICD-10-CM | POA: Insufficient documentation

## 2021-11-21 DIAGNOSIS — C220 Liver cell carcinoma: Secondary | ICD-10-CM

## 2021-11-21 DIAGNOSIS — S81801A Unspecified open wound, right lower leg, initial encounter: Secondary | ICD-10-CM | POA: Insufficient documentation

## 2021-11-21 DIAGNOSIS — T8149XA Infection following a procedure, other surgical site, initial encounter: Secondary | ICD-10-CM

## 2021-11-21 HISTORY — PX: IR RADIOLOGIST EVAL & MGMT: IMG5224

## 2021-11-21 MED ORDER — OXYCODONE-ACETAMINOPHEN 10-325 MG PO TABS: 1.0000 | ORAL_TABLET | ORAL | 0 refills | Status: DC | PRN

## 2021-11-21 MED ORDER — SULFAMETHOXAZOLE-TRIMETHOPRIM 800-160 MG PO TABS: 1.0000 | ORAL_TABLET | Freq: Two times a day (BID) | ORAL | 0 refills | Status: DC

## 2021-11-21 NOTE — Progress Notes (Signed)
? ?Referring Physician(s): ?Roosevelt Locks, NP ? ?Reason for follow up: ?1 month status post left hepatic transarterial radioembolization ? ?History of present illness: ?Danny [name] is a 56 y.o. male with history of alcoholic cirrhosis with recent imaging diagnosis of multifocal hepatocellular carcinoma.  He has complained of right upper quadrant pain for approximately 1 year.  Multiple imaging studies eventually led to a CT which demonstrated enhancing lesions in the left lobe of the liver.  This prompted and MRI which demonstrates a 2.7 cm LIRADS 5 mass in segment II and LIRADS 3 lesion in the right dome.  There was a 1.6 cm mass on prior CTA in segment III which was inconspicuous on MR, but highly suspicious for additional HCC.  ? ?He and his POA join today via telephone virtual visit.  He is now status post left hepatic TARE on 10/25/21.  Approximately 50% of the dose was delivered in segmentectomy fashion to the index segment II hepatoma followed by delivery of the remaining in a lobar distribution.   ? ?He reports some nausea, a few episodes of vomiting, now resolved.  He has had a normal appetite.  He continues to complain of mild abdominal pain - under right ribs.  Pain medication he is currently taking after knee surgery helps.  He endorses mild weakness and fatigue.   His POA reports some mild jaundice and scleral icterus, but this is unchanged since before the procedure. ? ? ? ?Past Medical History:  ?Diagnosis Date  ? Alcohol abuse   ? Anxiety   ? Back pain   ? CVA (cerebral infarction)   ? Depression   ? Diabetes mellitus   ? Hypertension   ? Migraines   ? Stroke [facility])   ? ? ?Past Surgical History:  ?Procedure Laterality Date  ? CAST APPLICATION Right 6/50/3546  ? Procedure: CAST APPLICATION;  Surgeon: Carole Civil, MD;  Location: [facility];  Service: Orthopedics;  Laterality: Right;  ? FOOT SURGERY    ? INCISION AND DRAINAGE Right 11/02/2021  ? Procedure: INCISION AND DRAINAGE RIGHT KNEE;   Surgeon: Carole Civil, MD;  Location: [facility];  Service: Orthopedics;  Laterality: Right;  ? IR 3D INDEPENDENT WKST  10/11/2021  ? IR ANGIOGRAM SELECTIVE EACH ADDITIONAL VESSEL  10/11/2021  ? IR ANGIOGRAM SELECTIVE EACH ADDITIONAL VESSEL  10/11/2021  ? IR ANGIOGRAM SELECTIVE EACH ADDITIONAL VESSEL  10/11/2021  ? IR ANGIOGRAM SELECTIVE EACH ADDITIONAL VESSEL  10/11/2021  ? IR ANGIOGRAM SELECTIVE EACH ADDITIONAL VESSEL  10/11/2021  ? IR ANGIOGRAM SELECTIVE EACH ADDITIONAL VESSEL  10/11/2021  ? IR ANGIOGRAM SELECTIVE EACH ADDITIONAL VESSEL  10/25/2021  ? IR ANGIOGRAM SELECTIVE EACH ADDITIONAL VESSEL  10/25/2021  ? IR ANGIOGRAM VISCERAL SELECTIVE  10/11/2021  ? IR EMBO TUMOR ORGAN ISCHEMIA INFARCT INC GUIDE ROADMAPPING  10/25/2021  ? IR EMBO TUMOR ORGAN ISCHEMIA INFARCT INC GUIDE ROADMAPPING  10/25/2021  ? IR RADIOLOGIST EVAL & MGMT  08/23/2021  ? IR US GUIDE VASC ACCESS RIGHT  10/11/2021  ? IR US GUIDE VASC ACCESS RIGHT  10/25/2021  ? MANDIBLE SURGERY    ? QUADRICEPS TENDON REPAIR Right 09/04/2021  ? Procedure: REPAIR QUADRICEP TENDON;  Surgeon: Carole Civil, MD;  Location: [facility];  Service: Orthopedics;  Laterality: Right;  ? TENDON EXPLORATION Right 11/02/2021  ? Procedure: QUADRICEPS TENDON AND PATELLAR TENDON EXPLORATION;  Surgeon: Carole Civil, MD;  Location: [facility];  Service: Orthopedics;  Laterality: Right;  ? ? ?Allergies: ?Ultram [tramadol] and Benadryl [diphenhydramine] ? ?  Medications: ?Prior to Admission medications   ?Medication Sig Start Date End Date Taking? Authorizing Provider  ?albuterol (VENTOLIN HFA) 108 (90 Base) MCG/ACT inhaler Inhale 2 puffs into the lungs every 6 (six) hours as needed for wheezing or shortness of breath.    [provider]  ?busPIRone (BUSPAR) 15 MG tablet Take 15 mg by mouth 2 (two) times daily.    [provider]  ?Cholecalciferol (VITAMIN D-3) 125 MCG (5000 UT) TABS Take 5,000 Units by mouth daily.    [provider]  ?citalopram (CELEXA) 20 MG tablet  Take 20 mg by mouth daily.    [provider]  ?gabapentin (NEURONTIN) 600 MG tablet Take 600 mg by mouth in the morning and at bedtime. 06/24/21   [provider]  ?hydrOXYzine (ATARAX) 25 MG tablet Take 25 mg by mouth 3 (three) times daily as needed for anxiety.    [provider]  ?meloxicam (MOBIC) 7.5 MG tablet Take 7.5 mg by mouth in the morning and at bedtime.    [provider]  ?metFORMIN (GLUCOPHAGE) 500 MG tablet Take 1 tablet (500 mg total) by mouth 2 (two) times daily with a meal. 01/01/18   Julianne Rice, MD  ?omeprazole (PRILOSEC) 40 MG capsule Take 1 capsule (40 mg total) by mouth daily before breakfast. 06/06/21   Erenest Rasher, PA-C  ?oxyCODONE-acetaminophen (PERCOCET) 10-325 MG tablet Take 1 tablet by mouth every 4 (four) hours as needed for pain. 11/21/21   Carole Civil, MD  ?QUEtiapine (SEROQUEL) 100 MG tablet Take 100 mg by mouth at bedtime.    [provider]  ?sulfamethoxazole-trimethoprim (BACTRIM DS) 800-160 MG tablet Take 1 tablet by mouth 2 (two) times daily. 11/21/21   Carole Civil, MD  ?  ? ?Family History  ?Problem Relation Age of Onset  ? Heart failure Mother   ? Stroke Father   ? Stroke Sister   ? Cancer Sister   ?     breast cancer  ? Colon cancer Neg Hx   ? Liver cancer Neg Hx   ? ? ?Social History  ? ?Socioeconomic History  ? Marital status: Legally Separated  ?  Spouse name: Not on file  ? Number of children: Not on file  ? Years of education: Not on file  ? Highest education level: Not on file  ?Occupational History  ? Not on file  ?Tobacco Use  ? Smoking status: Every Day  ?  Packs/day: 2.00  ?  Years: 20.00  ?  Pack years: 40.00  ?  Types: Cigarettes  ? Smokeless tobacco: Never  ?Vaping Use  ? Vaping Use: Never used  ?Substance and Sexual Activity  ? Alcohol use: Not Currently  ?  Comment: Quit 11 months ago. Used to drink about 6 pack a day. (documented 06/06/21)  ? Drug use: Yes  ?  Types: Marijuana  ?  Comment:  occas  ? Sexual activity: Yes  ?  Birth control/protection: None  ?Other Topics Concern  ? Not on file  ?Social History Narrative  ? Not on file  ? ?Social Determinants of Health  ? ?Financial Resource Strain: Not on file  ?Food Insecurity: Not on file  ?Transportation Needs: Not on file  ?Physical Activity: Not on file  ?Stress: Not on file  ?Social Connections: Not on file  ? ? ? ?Vital Signs: ?There were no vitals taken for this visit. ? ?No physical examination was performed in lieu of virtual telephone clinic visit. ? ? ?  Imaging: ?10/11/21 Tc-MAA mapping study ? ? ?Labs: ? ?CBC: ?Recent Labs  ?  07/10/21 ?1947 09/26/21 ?1239 10/11/21 ?0743 10/25/21 ?7342  ?WBC 7.4 7.9 5.9 8.1  ?HGB 14.6 15.1 14.6 15.0  ?HCT 43.1 45.5 42.1 44.0  ?PLT 151 156 113* 113*  ? ? ?COAGS: ?Recent Labs  ?  06/11/21 ?8768 10/11/21 ?1157 10/25/21 ?2620  ?INR 1.1 1.1 1.1  ? ? ?BMP: ?Recent Labs  ?  07/10/21 ?1947 09/24/21 ?1005 09/26/21 ?1239 10/11/21 ?0743 10/25/21 ?3559  ?NA 132*  --  139 135 137  ?K 3.7  --  4.1 4.1 3.9  ?CL 99  --  104 106 105  ?CO2 26  --  '24 23 22  '$ ?GLUCOSE 140*  --  115* 166* 135*  ?BUN 18  --  '13 14 14  '$ ?CALCIUM 8.7*  --  9.5 9.1 9.2  ?CREATININE 0.80 0.90 0.85 0.78 0.78  ?GFRNONAA >60  --  >60 >60 >60  ? ? ?LIVER FUNCTION TESTS: ?Recent Labs  ?  06/11/21 ?7416 09/26/21 ?1239 10/11/21 ?0743 10/25/21 ?3845  ?BILITOT 0.8 0.9 0.6 0.7  ?AST 61* 63* 41 48*  ?ALT 71* 49* 46* 43  ?ALKPHOS  --  105 116 114  ?PROT 7.1 7.2 6.8 7.8  ?ALBUMIN  --  3.6 3.4* 4.0  ? ? ?Assessment and Plan: ?56 year old male with recently diagnosed alcoholic cirrhosis and multifocal, left lobe dominant hepatocellular carcinoma now status post left lobar transarterial radioembolization (29.1 mCi) on 10/25/21.  He is recovering as expected with some mild post-embolic syndrome and fatigue. ? ?-obtain outpatient labs (CBC, CMP, INR) soon ?-follow up in 2 months with MRI abdomen to assess treatment response ? ? ?Electronically Signed: ?Jeanni Allshouse J  Emiley Digiacomo ?11/21/2021, 2:42 PM ? ? ?I spent a total of 25 Minutes in telephone virtual clinical consultation, greater than 50% of which was counseling/coordinating care for hepatocellular carcinoma. ? ? ? ? ? ?

## 2021-11-21 NOTE — Progress Notes (Signed)
Chief Complaint  ?Patient presents with  ? Routine Post Op  ?  DOS 11/02/21 ?Wound ck/ dressing change  ? ? ?Danny [name] is status post incision drainage exploration of quadriceps repair and exploration of patellar tendon and evaluation of wound ? ?He is currently on Bactrim ? ?Cast was removed today wet-to-dry dressing was placed ? ?Patient is now sent for wound care to try to get a colloid type dressing to help with healing ? ?He will continue with Bactrim and oxycodone and follow-up in a week for wound check ? ?Meds ordered this encounter  ?Medications  ? oxyCODONE-acetaminophen (PERCOCET) 10-325 MG tablet  ?  Sig: Take 1 tablet by mouth every 4 (four) hours as needed for pain.  ?  Dispense:  30 tablet  ?  Refill:  0  ? sulfamethoxazole-trimethoprim (BACTRIM DS) 800-160 MG tablet  ?  Sig: Take 1 tablet by mouth 2 (two) times daily.  ?  Dispense:  56 tablet  ?  Refill:  0  ? ? ? ?He is also placed in a playmaker brace weight-bear as tolerated free hinged range of motion ? ?

## 2021-11-21 NOTE — Therapy (Signed)
[place] [facility] ?[address] ?[place], [place], [address] ?Phone: [phone]   Fax:  [phone] ? ?Wound Care Evaluation ? ?Patient Details  ?Name: Danny [name] ?MRN: [id number] ?Date of Birth: 02-Jun-1966 ?Referring Provider (PT): Arther Abbott MD ? ? ?Encounter Date: 11/21/2021 ? ? PT End of Session - 11/21/21 1128   ? ? Visit Number 1   ? Number of Visits 16   ? Date for PT Re-Evaluation 01/16/22   ? Authorization Type Medicaid - [employer]   ? Authorization - Visit Number 1   ? Authorization - Number of Visits 27   ? PT Start Time 1130   ? PT Stop Time 7619   ? PT Time Calculation (min) 45 min   ? Activity Tolerance Patient tolerated treatment well   ? Behavior During Therapy [facility] for tasks assessed/performed   ? ?  ?  ? ?  ? ? ?Past Medical History:  ?Diagnosis Date  ? Alcohol abuse   ? Anxiety   ? Back pain   ? CVA (cerebral infarction)   ? Depression   ? Diabetes mellitus   ? Hypertension   ? Migraines   ? Stroke [facility])   ? ? ?Past Surgical History:  ?Procedure Laterality Date  ? CAST APPLICATION Right 01/15/3266  ? Procedure: CAST APPLICATION;  Surgeon: Carole Civil, MD;  Location: [facility];  Service: Orthopedics;  Laterality: Right;  ? FOOT SURGERY    ? INCISION AND DRAINAGE Right 11/02/2021  ? Procedure: INCISION AND DRAINAGE RIGHT KNEE;  Surgeon: Carole Civil, MD;  Location: [facility];  Service: Orthopedics;  Laterality: Right;  ? IR 3D INDEPENDENT WKST  10/11/2021  ? IR ANGIOGRAM SELECTIVE EACH ADDITIONAL VESSEL  10/11/2021  ? IR ANGIOGRAM SELECTIVE EACH ADDITIONAL VESSEL  10/11/2021  ? IR ANGIOGRAM SELECTIVE EACH ADDITIONAL VESSEL  10/11/2021  ? IR ANGIOGRAM SELECTIVE EACH ADDITIONAL VESSEL  10/11/2021  ? IR ANGIOGRAM SELECTIVE EACH ADDITIONAL VESSEL  10/11/2021  ? IR ANGIOGRAM SELECTIVE EACH ADDITIONAL VESSEL  10/11/2021  ? IR ANGIOGRAM SELECTIVE EACH ADDITIONAL VESSEL  10/25/2021  ? IR ANGIOGRAM SELECTIVE EACH ADDITIONAL VESSEL  10/25/2021  ? IR ANGIOGRAM VISCERAL  SELECTIVE  10/11/2021  ? IR EMBO TUMOR ORGAN ISCHEMIA INFARCT INC GUIDE ROADMAPPING  10/25/2021  ? IR EMBO TUMOR ORGAN ISCHEMIA INFARCT INC GUIDE ROADMAPPING  10/25/2021  ? IR RADIOLOGIST EVAL & MGMT  08/23/2021  ? IR US GUIDE VASC ACCESS RIGHT  10/11/2021  ? IR US GUIDE VASC ACCESS RIGHT  10/25/2021  ? MANDIBLE SURGERY    ? QUADRICEPS TENDON REPAIR Right 09/04/2021  ? Procedure: REPAIR QUADRICEP TENDON;  Surgeon: Carole Civil, MD;  Location: [facility];  Service: Orthopedics;  Laterality: Right;  ? TENDON EXPLORATION Right 11/02/2021  ? Procedure: QUADRICEPS TENDON AND PATELLAR TENDON EXPLORATION;  Surgeon: Carole Civil, MD;  Location: [facility];  Service: Orthopedics;  Laterality: Right;  ? ? ?There were no vitals filed for this visit. ? ? ? ? [facility] PT Assessment - 11/21/21 0001   ? ?  ? Assessment  ? Medical Diagnosis R knee wound   ? Referring Provider (PT) Arther Abbott MD   ? ?  ?  ? ?  ? ? Wound Therapy - 11/21/21 0001   ? ? Subjective Patient had a quad tendon repair on 09/04/21. Patient then was admitted on 09/26/21 for antibiotics due to infection in RLE. Patient then required incision and drainage on 11/02/21. Patient was in hinge brace  immediately following first surgery but then was put into cast. Wound had developed and worsened and has been very painful.   ? Patient and Family Stated Goals wound to heal   ? Date of Onset 09/04/21   ? Prior Treatments dressing changes at MD office   ? Pain Scale 0-10   ? Pain Score 10-Worst pain ever   ? Pain Type Surgical pain   ? Pain Location Knee   ? Pain Orientation Right   ? Evaluation and Treatment Procedures Explained to Patient/Family Yes   ? Evaluation and Treatment Procedures agreed to   ? Wound Properties Date First Assessed: 11/21/21 Time First Assessed: 1130 Wound Type: Incision - Dehisced Location: Knee Location Orientation: Anterior;Right Present on Admission: Yes  ? Wound Image Images linked: 1   ? Dressing Type Gauze (Comment)   ? Dressing Changed  Changed   ? Dressing Status Dry   ? Dressing Change Frequency PRN   ? Site / Wound Assessment Red;Yellow;Black;Dry   ? % Wound base Red or Granulating 15%   ? % Wound base Yellow/Fibrinous Exudate 70%   ? % Wound base Black/Eschar 15%   ? Peri-wound Assessment Erythema (non-blanchable)   ? Wound Length (cm) 4.9 cm   ? Wound Width (cm) 3 cm   ? Wound Surface Area (cm^2) 14.7 cm^2   ? Margins Epibole (rolled edges)   ? Drainage Amount None   ? Treatment Cleansed;Debridement (Selective)   ? Selective Debridement - Location R knee wound   ? Selective Debridement - Tools Used Forceps;Scalpel   ? Selective Debridement - Tissue Removed slough   ? Wound Therapy - Clinical Statement Patient is a 56 y.o. male who presents to physical therapy with wound at incision site of quad tendon repair. Patient?s wound is very dry and mostly covered by slough. Wound is very painful and patient does not tolerate much debarment today. Dressed wound with medihoney gel, 4x4, hydrogel, 4x4 and kerlix secured by medipore tape and netting. Reapplied patients hinge brace as it was not in contact with wound region. Patient educated on keeping dressing intact unless soiled. Patient will benefit from physical therapy to promote wound healing   ? Wound Therapy - Functional Problem List gait, bathing, dressing   ? Factors Delaying/Impairing Wound Healing Diabetes Mellitus;Immobility;Multiple medical problems;Tobacco use   ? Hydrotherapy Plan Debridement;Dressing change;Electrical stimulation;Patient/family education;Pulsatile lavage with suction   ? Wound Therapy - Frequency 2X / week   ? Wound Therapy - Current Recommendations PT   ? Wound Plan debride and dressing changes   ? Dressing  medihoney gel, 4x4, hydrogel, 4x4 and kerlix secured by medipore tape and netting   ? ?  ?  ? ?  ? ? ? ? ? ? ? ? ?Objective measurements completed on examination: See above findings.  ? ? ? ? ? ? ? ? ? ? ? PT Education - 11/21/21 1219   ? ? Education Details Patient  eduated on keeping dressing intact unless soiled, POC, exam finding   ? Person(s) Educated Associate Professor)   ? Methods Explanation   ? Comprehension Verbalized understanding   ? ?  ?  ? ?  ? ? ? PT Short Term Goals - 11/21/21 1224   ? ?  ? PT SHORT TERM GOAL #1  ? Title Patient wound will be free from slough to demonstrate wound healing.   ? Time 4   ? Period Weeks   ? Status New   ? Target Date 12/19/21   ? ?  ?  ? ?  ? ? ? ?  PT Long Term Goals - 11/21/21 1225   ? ?  ? PT LONG TERM GOAL #1  ? Title Patient wound to be healed to reduce risk of infection.   ? Time 8   ? Period Weeks   ? Status New   ? Target Date 01/16/22   ?  ? PT LONG TERM GOAL #2  ? Title Paitent will be able to transition to formal PT for knee rehab to improve functional mobility and strength.   ? Time 8   ? Period Weeks   ? Status New   ? Target Date 01/16/22   ? ?  ?  ? ?  ? ? ? ? ? ? ? Plan - 11/21/21 1220   ? ? Clinical Impression Statement see above   ? Personal Factors and Comorbidities Comorbidity 3+;Fitness;Past/Current Experience;Time since onset of injury/illness/exacerbation   ? Comorbidities liver ca, DM, Smokes, hx CVA, HTN   ? Examination-Activity Limitations Bathing;Locomotion Level;Transfers;Lift;Stand;Stairs;Squat   ? Examination-Participation Restrictions Meal Prep;Cleaning;Shop;Volunteer;Laundry;Valla [employer] Work   ? Stability/Clinical Decision Making Evolving/Moderate complexity   ? Clinical Decision Making Moderate   ? Rehab Potential Good   ? PT Frequency 2x / week   ? PT Duration 8 weeks   ? PT Treatment/Interventions ADLs/Self Care Home Management;DME Instruction;Gait training;Stair training;Functional mobility training;Therapeutic activities;Therapeutic exercise;Balance training;Neuromuscular re-education;Patient/family education;Orthotic Fit/Training;Energy conservation;Splinting;Taping;Scar mobilization;Compression bandaging;Manual techniques   ? PT Next Visit Plan debride and dressing changes   ? Consulted and Agree with  Plan of Care Patient;Family member/caregiver   ? ?  ?  ? ?  ? ? ?Patient will benefit from skilled therapeutic intervention in order to improve the following deficits and impairments:  Abnormal gait, Difficul

## 2021-11-22 ENCOUNTER — Other Ambulatory Visit: Payer: Self-pay | Admitting: *Deleted

## 2021-11-25 ENCOUNTER — Telehealth: Payer: Self-pay | Admitting: Gastroenterology

## 2021-11-25 NOTE — Telephone Encounter (Signed)
Received and reviewed office visit dated 11/07/2021 from Roosevelt Locks, NP with [facility].  We had referred patient for cirrhosis, hepatitis C, and hepatocellular carcinoma. ? ?He underwent Y90 reembolization on 10/25/2021 for [facility].  Stated if they see the results with Y90, patient may not need transplant.  They were concerned about transplant candidacy given history of COPD and overall debility.  Regarding hepatitis C, recommended treating Danny [name] first and if posttreatment imaging shows no viable tumor, then consider treating hepatitis C.  Regarding his history of cirrhosis, he had no decompensation.  Recommended patient following with his referring GI for variceal screening. ? ?Stacey:  ?We need to get patient scheduled for follow-up to discuss scheduling EGD in the next couple of weeks if possible with an APP or Dr. Abbey Chatters. Please schedule in person.   ?

## 2021-11-26 ENCOUNTER — Encounter (HOSPITAL_COMMUNITY): Payer: Self-pay | Admitting: Physical Therapy

## 2021-11-26 ENCOUNTER — Ambulatory Visit (HOSPITAL_COMMUNITY): Payer: Medicaid Other | Admitting: Physical Therapy

## 2021-11-26 ENCOUNTER — Other Ambulatory Visit: Payer: Self-pay

## 2021-11-26 DIAGNOSIS — R2689 Other abnormalities of gait and mobility: Secondary | ICD-10-CM | POA: Diagnosis not present

## 2021-11-26 DIAGNOSIS — T8149XA Infection following a procedure, other surgical site, initial encounter: Secondary | ICD-10-CM

## 2021-11-26 NOTE — Therapy (Signed)
[place] ?[place] ?[address] ?[place], [place], [address] ?Phone: [phone]   Fax:  [phone] ? ?Wound Care Therapy ? ?Patient Details  ?Name: Danny [name] ?MRN: [id number] ?Date of Birth: 12/22/1965 ?Referring Provider (PT): Arther Abbott MD ? ? ?Encounter Date: 11/26/2021 ? ? PT End of Session - 11/26/21 1003   ? ? Visit Number 2   ? Number of Visits 16   ? Date for PT Re-Evaluation 01/16/22   ? Authorization Type Medicaid - [employer]   ? Authorization Time Period [employer] approved 12 visits from 11/21/2021-02/19/2022   ? Authorization - Visit Number 1   ? Authorization - Number of Visits 12   ? PT Start Time 1004   ? PT Stop Time 4174   ? PT Time Calculation (min) 36 min   ? Activity Tolerance Patient tolerated treatment well   ? Behavior During Therapy [facility] for tasks assessed/performed   ? ?  ?  ? ?  ? ? ?Past Medical History:  ?Diagnosis Date  ? Alcohol abuse   ? Anxiety   ? Back pain   ? CVA (cerebral infarction)   ? Depression   ? Diabetes mellitus   ? Hypertension   ? Migraines   ? Stroke [facility])   ? ? ?Past Surgical History:  ?Procedure Laterality Date  ? CAST APPLICATION Right 0/81/4481  ? Procedure: CAST APPLICATION;  Surgeon: Carole Civil, MD;  Location: [facility];  Service: Orthopedics;  Laterality: Right;  ? FOOT SURGERY    ? INCISION AND DRAINAGE Right 11/02/2021  ? Procedure: INCISION AND DRAINAGE RIGHT KNEE;  Surgeon: Carole Civil, MD;  Location: [facility];  Service: Orthopedics;  Laterality: Right;  ? IR 3D INDEPENDENT WKST  10/11/2021  ? IR ANGIOGRAM SELECTIVE EACH ADDITIONAL VESSEL  10/11/2021  ? IR ANGIOGRAM SELECTIVE EACH ADDITIONAL VESSEL  10/11/2021  ? IR ANGIOGRAM SELECTIVE EACH ADDITIONAL VESSEL  10/11/2021  ? IR ANGIOGRAM SELECTIVE EACH ADDITIONAL VESSEL  10/11/2021  ? IR ANGIOGRAM SELECTIVE EACH ADDITIONAL VESSEL  10/11/2021  ? IR ANGIOGRAM SELECTIVE EACH ADDITIONAL VESSEL  10/11/2021  ? IR ANGIOGRAM SELECTIVE EACH ADDITIONAL VESSEL  10/25/2021  ? IR  ANGIOGRAM SELECTIVE EACH ADDITIONAL VESSEL  10/25/2021  ? IR ANGIOGRAM VISCERAL SELECTIVE  10/11/2021  ? IR EMBO TUMOR ORGAN ISCHEMIA INFARCT INC GUIDE ROADMAPPING  10/25/2021  ? IR EMBO TUMOR ORGAN ISCHEMIA INFARCT INC GUIDE ROADMAPPING  10/25/2021  ? IR RADIOLOGIST EVAL & MGMT  08/23/2021  ? IR RADIOLOGIST EVAL & MGMT  11/21/2021  ? IR US GUIDE VASC ACCESS RIGHT  10/11/2021  ? IR US GUIDE VASC ACCESS RIGHT  10/25/2021  ? MANDIBLE SURGERY    ? QUADRICEPS TENDON REPAIR Right 09/04/2021  ? Procedure: REPAIR QUADRICEP TENDON;  Surgeon: Carole Civil, MD;  Location: [facility];  Service: Orthopedics;  Laterality: Right;  ? TENDON EXPLORATION Right 11/02/2021  ? Procedure: QUADRICEPS TENDON AND PATELLAR TENDON EXPLORATION;  Surgeon: Carole Civil, MD;  Location: [facility];  Service: Orthopedics;  Laterality: Right;  ? ? ?There were no vitals filed for this visit. ? ? ? ? ? ? ? ? ? ? ? ? ? ? Wound Therapy - 11/26/21 0001   ? ? Subjective Had to change dressing due to leaking through some.   ? Patient and Family Stated Goals wound to heal   ? Date of Onset 09/04/21   ? Prior Treatments dressing changes at MD office   ? Pain Score 8    ?  Pain Type Surgical pain   ? Pain Location Knee   ? Pain Orientation Right   ? Evaluation and Treatment Procedures Explained to Patient/Family Yes   ? Evaluation and Treatment Procedures agreed to   ? Wound Properties Date First Assessed: 11/21/21 Time First Assessed: 1130 Wound Type: Incision - Dehisced Location: Knee Location Orientation: Anterior;Right Present on Admission: Yes  ? Dressing Type Gauze (Comment)   ? Dressing Changed Changed   ? Dressing Status Dry   ? Dressing Change Frequency PRN   ? Site / Wound Assessment Red;Yellow;Black;Dry   ? % Wound base Red or Granulating 40%   ? % Wound base Yellow/Fibrinous Exudate 60%   ? Peri-wound Assessment Erythema (non-blanchable)   ? Margins Epibole (rolled edges)   ? Drainage Amount Scant   ? Drainage Description Sanguineous   ? Treatment  Cleansed;Debridement (Selective)   ? Selective Debridement - Location R knee wound   ? Selective Debridement - Tools Used Forceps;Scalpel   ? Selective Debridement - Tissue Removed slough   ? Wound Therapy - Clinical Statement Patient with improving wound granulation and decreased slough. Edges are ebibole but overall wound appears to be healing well. Patient tolerates debridment of margins and slough well with continued high pain. Wound mostly slough but improving. Continued with medihoney and same dressing as last session. Added foam to inferior region due to hypergranulation. Patient will benefit from physical therapy to promote wound healing   ? Wound Therapy - Functional Problem List gait, bathing, dressing   ? Factors Delaying/Impairing Wound Healing Diabetes Mellitus;Immobility;Multiple medical problems;Tobacco use   ? Hydrotherapy Plan Debridement;Dressing change;Electrical stimulation;Patient/family education;Pulsatile lavage with suction   ? Wound Therapy - Frequency 2X / week   ? Wound Therapy - Current Recommendations PT   ? Wound Plan debride and dressing changes   ? Dressing  medihoney gel, 4x4, hydrogel, 4x4, foam piece to inferior wound bed and kerlix secured by medipore tape and netting   ? ?  ?  ? ?  ? ? ? ? ? ? ? ? ? ? ? ? PT Short Term Goals - 11/26/21 1004   ? ?  ? PT SHORT TERM GOAL #1  ? Title Patient wound will be free from slough to demonstrate wound healing.   ? Time 4   ? Period Weeks   ? Status On-going   ? Target Date 12/19/21   ? ?  ?  ? ?  ? ? ? ? PT Long Term Goals - 11/26/21 1004   ? ?  ? PT LONG TERM GOAL #1  ? Title Patient wound to be healed to reduce risk of infection.   ? Time 8   ? Period Weeks   ? Status On-going   ? Target Date 01/16/22   ?  ? PT LONG TERM GOAL #2  ? Title Paitent will be able to transition to formal PT for knee rehab to improve functional mobility and strength.   ? Time 8   ? Period Weeks   ? Status On-going   ? Target Date 01/16/22   ? ?  ?  ? ?   ? ? ? ? ? ? ? ? Plan - 11/26/21 1004   ? ? Clinical Impression Statement see above   ? Personal Factors and Comorbidities Comorbidity 3+;Fitness;Past/Current Experience;Time since onset of injury/illness/exacerbation   ? Comorbidities liver ca, DM, Smokes, hx CVA, HTN   ? Examination-Activity Limitations Bathing;Locomotion Level;Transfers;Lift;Stand;Stairs;Squat   ? Examination-Participation Restrictions Meal Prep;Cleaning;Shop;Volunteer;Laundry;Valla Leaver Work   ?  Stability/Clinical Decision Making Evolving/Moderate complexity   ? Rehab Potential Good   ? PT Frequency 2x / week   ? PT Duration 8 weeks   ? PT Treatment/Interventions ADLs/Self Care Home Management;DME Instruction;Gait training;Stair training;Functional mobility training;Therapeutic activities;Therapeutic exercise;Balance training;Neuromuscular re-education;Patient/family education;Orthotic Fit/Training;Energy conservation;Splinting;Taping;Scar mobilization;Compression bandaging;Manual techniques   ? PT Next Visit Plan debride and dressing changes   ? Consulted and Agree with Plan of Care Patient   ? ?  ?  ? ?  ? ? ?Patient will benefit from skilled therapeutic intervention in order to improve the following deficits and impairments:  Abnormal gait, Difficulty walking, Decreased skin integrity, Decreased activity tolerance, Pain, Impaired flexibility, Decreased mobility ? ?Visit Diagnosis: ?Other abnormalities of gait and mobility ? ?Cellulitis, wound, post-operative ? ? ? ? ?Problem List ?Patient Active Problem List  ? Diagnosis Date Noted  ? Wound infection after surgery 09/27/2021  ? Postoperative wound cellulitis 09/26/2021  ? Rupture of right quadriceps tendon   ? Chronic hepatitis C without hepatic coma ([place]) 08/08/2021  ? Hepatocellular carcinoma ([place]) 08/08/2021  ? COPD (chronic obstructive pulmonary disease) ([place]) 08/08/2021  ? Hypertension 08/08/2021  ? Other cirrhosis of liver ([place]) 08/08/2021  ? Type 2 diabetes mellitus ([place]) 08/08/2021  ?  Generalized abdominal pain 06/06/2021  ? Dysphagia 06/06/2021  ? Gastroesophageal reflux disease 06/06/2021  ? Abnormal liver ultrasound 06/06/2021  ? Colon cancer screening 06/06/2021  ? Elevated LFTs 06/06/2021  ? Jiles Harold

## 2021-11-28 ENCOUNTER — Ambulatory Visit (HOSPITAL_COMMUNITY): Payer: Medicaid Other

## 2021-11-28 ENCOUNTER — Other Ambulatory Visit: Payer: Self-pay

## 2021-11-28 ENCOUNTER — Ambulatory Visit (INDEPENDENT_AMBULATORY_CARE_PROVIDER_SITE_OTHER): Payer: Medicaid Other | Admitting: Orthopedic Surgery

## 2021-11-28 DIAGNOSIS — T8189XD Other complications of procedures, not elsewhere classified, subsequent encounter: Secondary | ICD-10-CM

## 2021-11-28 DIAGNOSIS — T8149XA Infection following a procedure, other surgical site, initial encounter: Secondary | ICD-10-CM

## 2021-11-28 MED ORDER — OXYCODONE-ACETAMINOPHEN 10-325 MG PO TABS: 1.0000 | ORAL_TABLET | ORAL | 0 refills | Status: DC | PRN

## 2021-11-28 MED ORDER — SULFAMETHOXAZOLE-TRIMETHOPRIM 800-160 MG PO TABS: 1.0000 | ORAL_TABLET | Freq: Two times a day (BID) | ORAL | 0 refills | Status: DC

## 2021-11-28 NOTE — Progress Notes (Signed)
Chief Complaint  ?Patient presents with  ? Routine Post Op  ?  DOS 11/02/21 ?RT knee ?Non-healing surgical wound//pt has another appt with wound care on 11/29/21//bandage removed today so provider could look at it  ? ?Meds ordered this encounter  ?Medications  ? sulfamethoxazole-trimethoprim (BACTRIM DS) 800-160 MG tablet  ?  Sig: Take 1 tablet by mouth 2 (two) times daily.  ?  Dispense:  56 tablet  ?  Refill:  0  ? oxyCODONE-acetaminophen (PERCOCET) 10-325 MG tablet  ?  Sig: Take 1 tablet by mouth every 4 (four) hours as needed for pain.  ?  Dispense:  30 tablet  ?  Refill:  0  ? ?Danny [name] is undergoing wound care he is on the medicines as listed he is in a hinged brace he likes the 0-30 stop primary care instead of the 0 to full range of motion ? ?See pictures for wound.  Wound is stable granulation tissue noted ? ?Recommend continue wound care follow-up in 2 weeks use brace for walking 0-30 stop ? ? ?

## 2021-11-29 ENCOUNTER — Encounter (HOSPITAL_COMMUNITY): Payer: Self-pay | Admitting: Physical Therapy

## 2021-11-29 ENCOUNTER — Ambulatory Visit (HOSPITAL_COMMUNITY): Payer: Medicaid Other | Admitting: Physical Therapy

## 2021-11-29 DIAGNOSIS — T8149XA Infection following a procedure, other surgical site, initial encounter: Secondary | ICD-10-CM

## 2021-11-29 DIAGNOSIS — R2689 Other abnormalities of gait and mobility: Secondary | ICD-10-CM | POA: Diagnosis not present

## 2021-11-29 NOTE — Therapy (Signed)
[facility] ?[address] ?[place], [place], [address] ?Phone: [phone]   Fax:  [phone] ? ?Wound Care Therapy ? ?Patient Details  ?Name: Danny [name] ?MRN: [id number] ?Date of Birth: 06-11-1966 ?Referring Provider (PT): Arther Abbott MD ? ? ?Encounter Date: 11/29/2021 ? ? PT End of Session - 11/29/21 1131   ? ? Visit Number 3   ? Number of Visits 16   ? Date for PT Re-Evaluation 01/16/22   ? Authorization Type Medicaid - [employer]   ? Authorization Time Period [employer] approved 12 visits from 11/21/2021-02/19/2022   ? Authorization - Visit Number 2   ? Authorization - Number of Visits 12   ? PT Start Time 7253   ? PT Stop Time 1210   ? PT Time Calculation (min) 39 min   ? Activity Tolerance Patient tolerated treatment well   ? Behavior During Therapy [facility] for tasks assessed/performed   ? ?  ?  ? ?  ? ? ?Past Medical History:  ?Diagnosis Date  ? Alcohol abuse   ? Anxiety   ? Back pain   ? CVA (cerebral infarction)   ? Depression   ? Diabetes mellitus   ? Hypertension   ? Migraines   ? Stroke [facility])   ? ? ?Past Surgical History:  ?Procedure Laterality Date  ? CAST APPLICATION Right 6/64/4034  ? Procedure: CAST APPLICATION;  Surgeon: Carole Civil, MD;  Location: [facility];  Service: Orthopedics;  Laterality: Right;  ? FOOT SURGERY    ? INCISION AND DRAINAGE Right 11/02/2021  ? Procedure: INCISION AND DRAINAGE RIGHT KNEE;  Surgeon: Carole Civil, MD;  Location: [facility];  Service: Orthopedics;  Laterality: Right;  ? IR 3D INDEPENDENT WKST  10/11/2021  ? IR ANGIOGRAM SELECTIVE EACH ADDITIONAL VESSEL  10/11/2021  ? IR ANGIOGRAM SELECTIVE EACH ADDITIONAL VESSEL  10/11/2021  ? IR ANGIOGRAM SELECTIVE EACH ADDITIONAL VESSEL  10/11/2021  ? IR ANGIOGRAM SELECTIVE EACH ADDITIONAL VESSEL  10/11/2021  ? IR ANGIOGRAM SELECTIVE EACH ADDITIONAL VESSEL  10/11/2021  ? IR ANGIOGRAM SELECTIVE EACH ADDITIONAL VESSEL  10/11/2021  ? IR ANGIOGRAM SELECTIVE EACH ADDITIONAL VESSEL  10/25/2021  ? IR  ANGIOGRAM SELECTIVE EACH ADDITIONAL VESSEL  10/25/2021  ? IR ANGIOGRAM VISCERAL SELECTIVE  10/11/2021  ? IR EMBO TUMOR ORGAN ISCHEMIA INFARCT INC GUIDE ROADMAPPING  10/25/2021  ? IR EMBO TUMOR ORGAN ISCHEMIA INFARCT INC GUIDE ROADMAPPING  10/25/2021  ? IR RADIOLOGIST EVAL & MGMT  08/23/2021  ? IR RADIOLOGIST EVAL & MGMT  11/21/2021  ? IR US GUIDE VASC ACCESS RIGHT  10/11/2021  ? IR US GUIDE VASC ACCESS RIGHT  10/25/2021  ? MANDIBLE SURGERY    ? QUADRICEPS TENDON REPAIR Right 09/04/2021  ? Procedure: REPAIR QUADRICEP TENDON;  Surgeon: Carole Civil, MD;  Location: [facility];  Service: Orthopedics;  Laterality: Right;  ? TENDON EXPLORATION Right 11/02/2021  ? Procedure: QUADRICEPS TENDON AND PATELLAR TENDON EXPLORATION;  Surgeon: Carole Civil, MD;  Location: [facility];  Service: Orthopedics;  Laterality: Right;  ? ? ?There were no vitals filed for this visit. ? ? ? ? ? ? ? ? ? ? ? ? ? ? Wound Therapy - 11/29/21 0001   ? ? Subjective Went to MD office for f/u and they changed dressing   ? Patient and Family Stated Goals wound to heal   ? Date of Onset 09/04/21   ? Prior Treatments dressing changes at MD office   ? Pain Score 8    ?  Pain Type Surgical pain   ? Pain Location Knee   ? Pain Orientation Right   ? Evaluation and Treatment Procedures Explained to Patient/Family Yes   ? Evaluation and Treatment Procedures agreed to   ? Wound Properties Date First Assessed: 11/21/21 Time First Assessed: 1130 Wound Type: Incision - Dehisced Location: Knee Location Orientation: Anterior;Right Present on Admission: Yes  ? Wound Image Images linked: 1   ? Dressing Type Gauze (Comment)   ? Dressing Changed Changed   ? Dressing Status Dry   ? Dressing Change Frequency PRN   ? Site / Wound Assessment Red;Yellow;Black;Dry   ? % Wound base Red or Granulating 40%   ? % Wound base Yellow/Fibrinous Exudate 60%   ? Peri-wound Assessment Erythema (non-blanchable)   ? Wound Length (cm) 4.5 cm   ? Wound Width (cm) 3.5 cm   ? Wound Surface Area  (cm^2) 15.75 cm^2   ? Margins Epibole (rolled edges)   ? Drainage Amount Scant   ? Drainage Description Sanguineous   ? Treatment Cleansed;Debridement (Selective)   ? Selective Debridement - Location R knee wound   ? Selective Debridement - Tools Used Forceps;Scalpel   ? Selective Debridement - Tissue Removed slough   ? Wound Therapy - Clinical Statement Patient tolerates debridement well today of slough and margins due to ebibole with undermining present. Continued with medihoney gel followed by hydrogel for moisture. Wound appears to be progressing well.  Patient will benefit from physical therapy to promote wound healing   ? Wound Therapy - Functional Problem List gait, bathing, dressing   ? Factors Delaying/Impairing Wound Healing Diabetes Mellitus;Immobility;Multiple medical problems;Tobacco use   ? Hydrotherapy Plan Debridement;Dressing change;Electrical stimulation;Patient/family education;Pulsatile lavage with suction   ? Wound Therapy - Frequency 2X / week   ? Wound Therapy - Current Recommendations PT   ? Wound Plan debride and dressing changes   ? Dressing  medihoney gel, 4x4, hydrogel, 4x4, foam piece to inferior wound bed and kerlix secured by medipore tape and netting   ? ?  ?  ? ?  ? ? ? ? ? ? ? ? ? ? ? ? PT Short Term Goals - 11/26/21 1004   ? ?  ? PT SHORT TERM GOAL #1  ? Title Patient wound will be free from slough to demonstrate wound healing.   ? Time 4   ? Period Weeks   ? Status On-going   ? Target Date 12/19/21   ? ?  ?  ? ?  ? ? ? ? PT Long Term Goals - 11/26/21 1004   ? ?  ? PT LONG TERM GOAL #1  ? Title Patient wound to be healed to reduce risk of infection.   ? Time 8   ? Period Weeks   ? Status On-going   ? Target Date 01/16/22   ?  ? PT LONG TERM GOAL #2  ? Title Paitent will be able to transition to formal PT for knee rehab to improve functional mobility and strength.   ? Time 8   ? Period Weeks   ? Status On-going   ? Target Date 01/16/22   ? ?  ?  ? ?  ? ? ? ? ? ? ? ? Plan - 11/29/21  1131   ? ? Clinical Impression Statement see above   ? Personal Factors and Comorbidities Comorbidity 3+;Fitness;Past/Current Experience;Time since onset of injury/illness/exacerbation   ? Comorbidities liver ca, DM, Smokes, hx CVA, HTN   ? Examination-Activity  Limitations Bathing;Locomotion Level;Transfers;Lift;Stand;Stairs;Squat   ? Examination-Participation Restrictions Meal Prep;Cleaning;Shop;Volunteer;Laundry;[place] Work   ? Stability/Clinical Decision Making Evolving/Moderate complexity   ? Rehab Potential Good   ? PT Frequency 2x / week   ? PT Duration 8 weeks   ? PT Treatment/Interventions ADLs/Self Care Home Management;DME Instruction;Gait training;Stair training;Functional mobility training;Therapeutic activities;Therapeutic exercise;Balance training;Neuromuscular re-education;Patient/family education;Orthotic Fit/Training;Energy conservation;Splinting;Taping;Scar mobilization;Compression bandaging;Manual techniques   ? PT Next Visit Plan debride and dressing changes   ? Consulted and Agree with Plan of Care Patient   ? ?  ?  ? ?  ? ? ?Patient will benefit from skilled therapeutic intervention in order to improve the following deficits and impairments:  Abnormal gait, Difficulty walking, Decreased skin integrity, Decreased activity tolerance, Pain, Impaired flexibility, Decreased mobility ? ?Visit Diagnosis: ?Other abnormalities of gait and mobility ? ?Cellulitis, wound, post-operative ? ? ? ? ?Problem List ?Patient Active Problem List  ? Diagnosis Date Noted  ? Wound infection after surgery 09/27/2021  ? Postoperative wound cellulitis 09/26/2021  ? Rupture of right quadriceps tendon   ? Chronic hepatitis C without hepatic coma ([place]) 08/08/2021  ? Hepatocellular carcinoma ([place]) 08/08/2021  ? COPD (chronic obstructive pulmonary disease) ([place]) 08/08/2021  ? Hypertension 08/08/2021  ? Other cirrhosis of liver ([place]) 08/08/2021  ? Type 2 diabetes mellitus ([place]) 08/08/2021  ? Generalized abdominal pain 06/06/2021   ? Dysphagia 06/06/2021  ? Gastroesophageal reflux disease 06/06/2021  ? Abnormal liver ultrasound 06/06/2021  ? Colon cancer screening 06/06/2021  ? Elevated LFTs 06/06/2021  ? GREATER TROCHANTERIC BURS

## 2021-12-04 ENCOUNTER — Ambulatory Visit (HOSPITAL_COMMUNITY): Payer: Medicaid Other | Admitting: Physical Therapy

## 2021-12-04 ENCOUNTER — Other Ambulatory Visit: Payer: Self-pay | Admitting: Orthopedic Surgery

## 2021-12-04 ENCOUNTER — Other Ambulatory Visit: Payer: Self-pay

## 2021-12-04 DIAGNOSIS — R2689 Other abnormalities of gait and mobility: Secondary | ICD-10-CM | POA: Diagnosis not present

## 2021-12-04 DIAGNOSIS — S81801A Unspecified open wound, right lower leg, initial encounter: Secondary | ICD-10-CM

## 2021-12-04 DIAGNOSIS — T8149XA Infection following a procedure, other surgical site, initial encounter: Secondary | ICD-10-CM

## 2021-12-04 DIAGNOSIS — T8189XD Other complications of procedures, not elsewhere classified, subsequent encounter: Secondary | ICD-10-CM

## 2021-12-04 MED ORDER — OXYCODONE-ACETAMINOPHEN 10-325 MG PO TABS: 1.0000 | ORAL_TABLET | ORAL | 0 refills | Status: DC | PRN

## 2021-12-04 NOTE — Therapy (Addendum)
[facility] ?[address] ?[place], [place], [address] ?Phone: [phone]   Fax:  [phone] ? ?Wound Care Therapy ? ?Patient Details  ?Name: Danny [name] ?MRN: [id number] ?Date of Birth: 07-Jan-1966 ?Referring Provider (PT): Arther Abbott MD ? ? ?Encounter Date: 12/04/2021 ? ? PT End of Session - 12/04/21 1054   ? ? Visit Number 4   ? Number of Visits 16   ? Date for PT Re-Evaluation 01/16/22   ? Authorization Type Medicaid - [employer]   ? Authorization Time Period [employer] approved 12 visits from 11/21/2021-02/19/2022   ? Authorization - Visit Number 3   ? Authorization - Number of Visits 12   ? PT Start Time 1005   ? PT Stop Time 1050   ? PT Time Calculation (min) 45 min   ? Activity Tolerance Patient tolerated treatment well   ? Behavior During Therapy [facility] for tasks assessed/performed   ? ?  ?  ? ?  ? ? ?Past Medical History:  ?Diagnosis Date  ? Alcohol abuse   ? Anxiety   ? Back pain   ? CVA (cerebral infarction)   ? Depression   ? Diabetes mellitus   ? Hypertension   ? Migraines   ? Stroke [facility])   ? ? ?Past Surgical History:  ?Procedure Laterality Date  ? CAST APPLICATION Right 2/99/2426  ? Procedure: CAST APPLICATION;  Surgeon: Carole Civil, MD;  Location: [facility];  Service: Orthopedics;  Laterality: Right;  ? FOOT SURGERY    ? INCISION AND DRAINAGE Right 11/02/2021  ? Procedure: INCISION AND DRAINAGE RIGHT KNEE;  Surgeon: Carole Civil, MD;  Location: [facility];  Service: Orthopedics;  Laterality: Right;  ? IR 3D INDEPENDENT WKST  10/11/2021  ? IR ANGIOGRAM SELECTIVE EACH ADDITIONAL VESSEL  10/11/2021  ? IR ANGIOGRAM SELECTIVE EACH ADDITIONAL VESSEL  10/11/2021  ? IR ANGIOGRAM SELECTIVE EACH ADDITIONAL VESSEL  10/11/2021  ? IR ANGIOGRAM SELECTIVE EACH ADDITIONAL VESSEL  10/11/2021  ? IR ANGIOGRAM SELECTIVE EACH ADDITIONAL VESSEL  10/11/2021  ? IR ANGIOGRAM SELECTIVE EACH ADDITIONAL VESSEL  10/11/2021  ? IR ANGIOGRAM SELECTIVE EACH ADDITIONAL VESSEL  10/25/2021  ? IR  ANGIOGRAM SELECTIVE EACH ADDITIONAL VESSEL  10/25/2021  ? IR ANGIOGRAM VISCERAL SELECTIVE  10/11/2021  ? IR EMBO TUMOR ORGAN ISCHEMIA INFARCT INC GUIDE ROADMAPPING  10/25/2021  ? IR EMBO TUMOR ORGAN ISCHEMIA INFARCT INC GUIDE ROADMAPPING  10/25/2021  ? IR RADIOLOGIST EVAL & MGMT  08/23/2021  ? IR RADIOLOGIST EVAL & MGMT  11/21/2021  ? IR US GUIDE VASC ACCESS RIGHT  10/11/2021  ? IR US GUIDE VASC ACCESS RIGHT  10/25/2021  ? MANDIBLE SURGERY    ? QUADRICEPS TENDON REPAIR Right 09/04/2021  ? Procedure: REPAIR QUADRICEP TENDON;  Surgeon: Carole Civil, MD;  Location: [facility];  Service: Orthopedics;  Laterality: Right;  ? TENDON EXPLORATION Right 11/02/2021  ? Procedure: QUADRICEPS TENDON AND PATELLAR TENDON EXPLORATION;  Surgeon: Carole Civil, MD;  Location: [facility];  Service: Orthopedics;  Laterality: Right;  ? ? ?There were no vitals filed for this visit. ? ? ? ? ? ? ? ? ? ? ? ? ? ? Wound Therapy - 12/04/21 1056   ? ? Subjective pt states his knee wound is tender.  STates he has a rubbed place below his knee wound from the brace and new wounds on his lateral ankle and great toe.   ? Patient and Family Stated Goals wound to heal   ?  Date of Onset 09/04/21   ? Prior Treatments dressing changes at MD office   ? Pain Scale 0-10   ? Pain Score 8    ? Pain Type Other (Comment)   ? Pain Location Knee   ? Pain Orientation Right   ? Evaluation and Treatment Procedures Explained to Patient/Family Yes   ? Evaluation and Treatment Procedures agreed to   ? Wound Properties Date First Assessed: 12/04/21 Time First Assessed: 1010 Location: Pretibial Location Orientation: Proximal;Right Present on Admission: -- , unsure   ? Wound Image Images linked: 1   ? Dressing Type None   ? Dressing Changed New   ? Dressing Status None   ? Dressing Change Frequency PRN   ? Site / Wound Assessment Dry;Black;Yellow   ? % Wound base Red or Granulating 0%   ? % Wound base Yellow/Fibrinous Exudate 10%   ? % Wound base Black/Eschar 90%   ? Peri-wound  Assessment Erythema (blanchable)   outlined with marker  ? Wound Length (cm) 1.3 cm   ? Wound Width (cm) 0.8 cm   ? Wound Surface Area (cm^2) 1.04 cm^2   ? Margins Unattached edges (unapproximated)   ? Drainage Amount None   ? Treatment Cleansed   ? Wound Properties Date First Assessed: 12/04/21 Time First Assessed: 1010 Location: Ankle Location Orientation: Right;Lateral Present on Admission: No  ? Wound Image Images linked: 1   ? Dressing Type None   ? Dressing Changed New   ? Dressing Status None   ? Dressing Change Frequency PRN   ? Site / Wound Assessment Dry;Brown;Yellow   ? % Wound base Red or Granulating 0%   ? % Wound base Yellow/Fibrinous Exudate 5%   ? % Wound base Black/Eschar 95%   ? Peri-wound Assessment Erythema (blanchable)   ? Wound Length (cm) 1.4 cm   ? Wound Width (cm) 1.2 cm   ? Wound Surface Area (cm^2) 1.68 cm^2   ? Margins Unattached edges (unapproximated)   ? Drainage Amount Scant   ? Drainage Description Serosanguineous   ? Treatment Cleansed   ? Wound Properties Date First Assessed: 12/04/21 Location: Toe (Comment  which one) Location Orientation: Anterior;Right Wound Description (Comments): Great Toe Present on Admission: No  ? Wound Image Images linked: 1   ? Dressing Type None   bandaid  ? Dressing Changed New   ? Dressing Status Intact   ? Dressing Change Frequency PRN   ? Site / Wound Assessment Painful;Pink;Yellow   ? % Wound base Red or Granulating 0%   ? % Wound base Yellow/Fibrinous Exudate 30%   ? % Wound base Black/Eschar 70%   ? Peri-wound Assessment Intact   ? Wound Length (cm) 1 cm   ? Wound Width (cm) 0.6 cm   ? Wound Surface Area (cm^2) 0.6 cm^2   ? Margins Unattached edges (unapproximated)   ? Drainage Amount Scant   ? Treatment Cleansed   ? Wound Properties Date First Assessed: 11/21/21 Time First Assessed: 1130 Wound Type: Incision - Dehisced Location: Knee Location Orientation: Anterior;Right Present on Admission: Yes  ? Wound Image Images linked: 1   ? Dressing Type  Gauze (Comment)   ? Dressing Changed Changed   ? Dressing Status Dry   ? Dressing Change Frequency PRN   ? Site / Wound Assessment Red;Yellow;Black;Dry   ? % Wound base Red or Granulating 45%   ? % Wound base Yellow/Fibrinous Exudate 55%   ? Peri-wound Assessment Erythema (non-blanchable)   ?  Wound Length (cm) 4.5 cm   ? Wound Width (cm) 3.5 cm   ? Wound Surface Area (cm^2) 15.75 cm^2   ? Margins Epibole (rolled edges)   ? Drainage Amount Minimal   ? Drainage Description Serosanguineous   ? Treatment Cleansed;Debridement (Selective)   ? Selective Debridement - Location R knee wound   ? Selective Debridement - Tools Used Forceps;Scalpel   ? Selective Debridement - Tissue Removed slough   ? Wound Therapy - Clinical Statement Pt returns today and is pleased overall with how improved his knee appears today.  Able to debride away devitalized tissue from periemter with edges still showing some epibole in areas around border.  Also noted 3 additional wounds that were photographed and measured this session.  THese wounds were cleansed and dressed but not debrided this session as will need to be added to order.  Medihoney used on all wounds today.  Extra padding placed over Rt shin area to help protect from brace.   ? Wound Therapy - Functional Problem List gait, bathing, dressing   ? Factors Delaying/Impairing Wound Healing Diabetes Mellitus;Immobility;Multiple medical problems;Tobacco use   ? Hydrotherapy Plan Debridement;Dressing change;Electrical stimulation;Patient/family education;Pulsatile lavage with suction   ? Wound Therapy - Frequency 2X / week   ? Wound Therapy - Current Recommendations PT   ? Wound Plan debride and dressing changes.  Add additional wounds to treatment   ? Dressing  Rt knee: medihoney gel, 4X4, ABD, kerlix, netting   ? Dressing Rt shin: medihoney gel, 4X4, ABD, kerlix, netting   ? Dressing Lat ankle: medihoney, 2X2, medipore   ? Manual Therapy Great toe: medihoney, 2X2, medipore   ? ?  ?  ? ?   ? ?Assesment: ?Patient with several other LE wounds that were appearing to be small scabbed over areas but were actually areas of devitalized tissue with delayed healing. PTA measured and photographed wou

## 2021-12-04 NOTE — Telephone Encounter (Signed)
Patient requests refill: ?oxyCODONE-acetaminophen (PERCOCET) 10-325 MG tablet 30 tablet  ?    [place], [place] ?

## 2021-12-04 NOTE — Telephone Encounter (Signed)
Request sent to provider.

## 2021-12-05 NOTE — Addendum Note (Signed)
Addended by: Mearl Latin on: 12/05/2021 01:37 PM ? ? Modules accepted: Orders ? ?

## 2021-12-07 ENCOUNTER — Encounter (HOSPITAL_COMMUNITY): Payer: Self-pay

## 2021-12-07 ENCOUNTER — Ambulatory Visit (HOSPITAL_COMMUNITY): Payer: Medicaid Other

## 2021-12-07 DIAGNOSIS — S81801A Unspecified open wound, right lower leg, initial encounter: Secondary | ICD-10-CM

## 2021-12-07 DIAGNOSIS — R2689 Other abnormalities of gait and mobility: Secondary | ICD-10-CM

## 2021-12-07 DIAGNOSIS — T8149XA Infection following a procedure, other surgical site, initial encounter: Secondary | ICD-10-CM

## 2021-12-07 NOTE — Therapy (Signed)
[facility] ?[address] ?[place], [place], [address] ?Phone: [phone]   Fax:  [phone] ? ?Wound Care Therapy ? ?Patient Details  ?Name: Danny [name] ?MRN: [id number] ?Date of Birth: 1965-11-16 ?Referring Provider (PT): Arther Abbott MD ? ? ?Encounter Date: 12/07/2021 ? ? PT End of Session - 12/07/21 1311   ? ? Visit Number 5   ? Number of Visits 16   ? Date for PT Re-Evaluation 01/16/22   ? Authorization Type Medicaid - [employer]   ? Authorization Time Period [employer] approved 12 visits from 11/21/2021-02/19/2022   ? Authorization - Visit Number 4   ? Authorization - Number of Visits 12   ? PT Start Time 1140   ? PT Stop Time 3903   ? PT Time Calculation (min) 62 min   ? Activity Tolerance Patient tolerated treatment well   ? Behavior During Therapy [facility] for tasks assessed/performed   ? ?  ?  ? ?  ? ? ?Past Medical History:  ?Diagnosis Date  ? Alcohol abuse   ? Anxiety   ? Back pain   ? CVA (cerebral infarction)   ? Depression   ? Diabetes mellitus   ? Hypertension   ? Migraines   ? Stroke [facility])   ? ? ?Past Surgical History:  ?Procedure Laterality Date  ? CAST APPLICATION Right 0/05/2329  ? Procedure: CAST APPLICATION;  Surgeon: Carole Civil, MD;  Location: [facility];  Service: Orthopedics;  Laterality: Right;  ? FOOT SURGERY    ? INCISION AND DRAINAGE Right 11/02/2021  ? Procedure: INCISION AND DRAINAGE RIGHT KNEE;  Surgeon: Carole Civil, MD;  Location: [facility];  Service: Orthopedics;  Laterality: Right;  ? IR 3D INDEPENDENT WKST  10/11/2021  ? IR ANGIOGRAM SELECTIVE EACH ADDITIONAL VESSEL  10/11/2021  ? IR ANGIOGRAM SELECTIVE EACH ADDITIONAL VESSEL  10/11/2021  ? IR ANGIOGRAM SELECTIVE EACH ADDITIONAL VESSEL  10/11/2021  ? IR ANGIOGRAM SELECTIVE EACH ADDITIONAL VESSEL  10/11/2021  ? IR ANGIOGRAM SELECTIVE EACH ADDITIONAL VESSEL  10/11/2021  ? IR ANGIOGRAM SELECTIVE EACH ADDITIONAL VESSEL  10/11/2021  ? IR ANGIOGRAM SELECTIVE EACH ADDITIONAL VESSEL  10/25/2021  ? IR  ANGIOGRAM SELECTIVE EACH ADDITIONAL VESSEL  10/25/2021  ? IR ANGIOGRAM VISCERAL SELECTIVE  10/11/2021  ? IR EMBO TUMOR ORGAN ISCHEMIA INFARCT INC GUIDE ROADMAPPING  10/25/2021  ? IR EMBO TUMOR ORGAN ISCHEMIA INFARCT INC GUIDE ROADMAPPING  10/25/2021  ? IR RADIOLOGIST EVAL & MGMT  08/23/2021  ? IR RADIOLOGIST EVAL & MGMT  11/21/2021  ? IR US GUIDE VASC ACCESS RIGHT  10/11/2021  ? IR US GUIDE VASC ACCESS RIGHT  10/25/2021  ? MANDIBLE SURGERY    ? QUADRICEPS TENDON REPAIR Right 09/04/2021  ? Procedure: REPAIR QUADRICEP TENDON;  Surgeon: Carole Civil, MD;  Location: [facility];  Service: Orthopedics;  Laterality: Right;  ? TENDON EXPLORATION Right 11/02/2021  ? Procedure: QUADRICEPS TENDON AND PATELLAR TENDON EXPLORATION;  Surgeon: Carole Civil, MD;  Location: [facility];  Service: Orthopedics;  Laterality: Right;  ? ? ?There were no vitals filed for this visit. ? ? ? Subjective Assessment - 12/07/21 1801   ? ? Subjective Pt arrived with dressings intact, reports pain scale 7/10 Rt LE burning.  Reports he is happy wiht progress wound has made.   ? ?  ?  ? ?  ? ? ? ? ? ? ? ? ? ? ? ? Wound Therapy - 12/07/21 0001   ? ? Subjective Pt  arrived with dressings intact, reports pain scale 8/10 Rt LE burning.  Reports he is happy wiht progress wound has made.   ? Patient and Family Stated Goals wound to heal   ? Date of Onset 09/04/21   ? Prior Treatments dressing changes at MD office   ? Pain Scale 0-10   ? Pain Score 8    ? Pain Type Surgical pain   ? Pain Location Knee   ? Pain Orientation Right   ? Evaluation and Treatment Procedures Explained to Patient/Family Yes   ? Evaluation and Treatment Procedures agreed to   ? Wound Properties Date First Assessed: 12/07/21 Wound Type: Other (Comment) Location: Ankle Location Orientation: Posterior Wound Description (Comments): wound superior posterior ankle Present on Admission: No  ? Wound Image Images linked: 1   ? Dressing Type --   medihoney, vaseline perimeter 2x2, medipore tape  ?  Dressing Changed New   ? Dressing Status Old drainage   ? Dressing Change Frequency PRN   ? Site / Wound Assessment Brown;Dusky   ? % Wound base Red or Granulating 0%   ? % Wound base Black/Eschar 100%   ? Peri-wound Assessment Erythema (blanchable)   ? Wound Length (cm) 1.3 cm   ? Wound Width (cm) 0.4 cm   ? Wound Depth (cm) --   unknown  ? Wound Surface Area (cm^2) 0.52 cm^2   ? Drainage Amount None   ? Treatment Cleansed   no debridement just cleansed and dressings  ? Wound Properties Date First Assessed: 12/07/21 Wound Type: Other (Comment) Location: Ankle Location Orientation: Posterior Wound Description (Comments): inferior wound posterior ankle Present on Admission: No  ? Wound Image --   See above with superior posterior ankle picutre  ? Dressing Type --   medihoney, vaseline perimeter 2x2, medipore tape  ? Dressing Changed New   ? Dressing Status Old drainage   ? Dressing Change Frequency PRN   ? Site / Wound Assessment Yellow   ? % Wound base Red or Granulating 0%   ? % Wound base Yellow/Fibrinous Exudate 100%   ? Wound Length (cm) 0.8 cm   ? Wound Width (cm) 0.4 cm   ? Wound Depth (cm) --   unknwon  ? Wound Surface Area (cm^2) 0.32 cm^2   ? Treatment Cleansed   no debridement just cleansed and dressings  ? Wound Properties Date First Assessed: 12/04/21 Time First Assessed: 1010 Location: Pretibial Location Orientation: Proximal;Right Present on Admission: -- , unsure   ? Wound Image Images linked: 1   ? Dressing Type --   medihoney, vaseline perimeter 2x2, medipore tape  ? Dressing Changed Changed   ? Dressing Status Old drainage   ? Dressing Change Frequency PRN   ? Site / Wound Assessment Dry;Black;Yellow   ? % Wound base Red or Granulating 0%   ? % Wound base Yellow/Fibrinous Exudate 100%   ? Peri-wound Assessment Erythema (blanchable)   ? Wound Length (cm) 1.3 cm   ? Wound Width (cm) 0.8 cm   ? Wound Surface Area (cm^2) 1.04 cm^2   ? Margins Unattached edges (unapproximated)   ? Drainage Amount None    ? Treatment Cleansed;Debridement (Selective)   ? Wound Properties Date First Assessed: 12/04/21 Time First Assessed: 1010 Location: Ankle Location Orientation: Right;Lateral Present on Admission: No  ? Wound Image Images linked: 1   ? Dressing Type --   medihoney, vaseline perimeter 2x2, medipore tape  ? Dressing Changed Changed   ? Dressing  Status Old drainage   ? Dressing Change Frequency PRN   ? Site / Wound Assessment Dry;Brown;Yellow   ? % Wound base Red or Granulating 5%   ? % Wound base Yellow/Fibrinous Exudate 95%   ? Peri-wound Assessment Erythema (blanchable)   ? Wound Length (cm) 1.4 cm   ? Wound Width (cm) 1.2 cm   ? Wound Surface Area (cm^2) 1.68 cm^2   ? Margins Unattached edges (unapproximated)   ? Drainage Amount Scant   ? Drainage Description Serosanguineous   ? Treatment Cleansed;Debridement (Selective)   ? Wound Properties Date First Assessed: 12/04/21 Location: Toe (Comment  which one) Location Orientation: Anterior;Right Wound Description (Comments): Great Toe Present on Admission: No  ? Dressing Type --   medihoney, vaseline perimeter 2x2, medipore tape  ? Dressing Changed Changed   ? Dressing Status Intact   ? Dressing Change Frequency PRN   ? Site / Wound Assessment Painful;Pink;Yellow   ? % Wound base Red or Granulating 0%   ? % Wound base Yellow/Fibrinous Exudate 30%   ? % Wound base Black/Eschar 70%   ? Peri-wound Assessment Erythema (blanchable)   ? Wound Length (cm) 1 cm   ? Wound Width (cm) 0.6 cm   ? Wound Surface Area (cm^2) 0.6 cm^2   ? Margins Unattached edges (unapproximated)   ? Drainage Amount Scant   ? Treatment Cleansed;Debridement (Selective)   ? Wound Properties Date First Assessed: 11/21/21 Time First Assessed: 1130 Wound Type: Incision - Dehisced Location: Knee Location Orientation: Anterior;Right Present on Admission: Yes  ? Wound Image Images linked: 1   ? Dressing Type Gauze (Comment)   ? Dressing Changed Changed   ? Dressing Status Dry   ? Dressing Change Frequency PRN    ? Site / Wound Assessment Red;Yellow;Black;Dry   ? % Wound base Red or Granulating 50%   ? % Wound base Yellow/Fibrinous Exudate 50%   ? Wound Length (cm) 4.5 cm   ? Wound Width (cm) 3.5 cm   ? Wound

## 2021-12-11 ENCOUNTER — Ambulatory Visit (HOSPITAL_COMMUNITY): Payer: Medicaid Other | Attending: Orthopedic Surgery | Admitting: Physical Therapy

## 2021-12-11 DIAGNOSIS — R2689 Other abnormalities of gait and mobility: Secondary | ICD-10-CM | POA: Insufficient documentation

## 2021-12-11 DIAGNOSIS — S81801A Unspecified open wound, right lower leg, initial encounter: Secondary | ICD-10-CM | POA: Insufficient documentation

## 2021-12-11 DIAGNOSIS — T8149XA Infection following a procedure, other surgical site, initial encounter: Secondary | ICD-10-CM | POA: Insufficient documentation

## 2021-12-11 NOTE — Therapy (Signed)
[facility] ?[address] ?[place], [place], [address] ?Phone: [phone]   Fax:  [phone] ? ?Wound Care Therapy ? ?Patient Details  ?Name: Danny [name] ?MRN: [id number] ?Date of Birth: 02-27-1966 ?Referring Provider (PT): Arther Abbott MD ? ? ?Encounter Date: 12/11/2021 ? ? PT End of Session - 12/11/21 1044   ? ? Visit Number 6   ? Number of Visits 16   ? Date for PT Re-Evaluation 01/16/22   ? Authorization Type Medicaid - [employer]   ? Authorization Time Period [employer] approved 12 visits from 11/21/2021-02/19/2022   ? Authorization - Visit Number 5   ? Authorization - Number of Visits 12   ? PT Start Time 1005   ? PT Stop Time 6195   ? PT Time Calculation (min) 35 min   ? Activity Tolerance Patient tolerated treatment well   ? Behavior During Therapy [facility] for tasks assessed/performed   ? ?  ?  ? ?  ? ? ?Past Medical History:  ?Diagnosis Date  ? Alcohol abuse   ? Anxiety   ? Back pain   ? CVA (cerebral infarction)   ? Depression   ? Diabetes mellitus   ? Hypertension   ? Migraines   ? Stroke [facility])   ? ? ?Past Surgical History:  ?Procedure Laterality Date  ? CAST APPLICATION Right 0/93/2671  ? Procedure: CAST APPLICATION;  Surgeon: Carole Civil, MD;  Location: [facility];  Service: Orthopedics;  Laterality: Right;  ? FOOT SURGERY    ? INCISION AND DRAINAGE Right 11/02/2021  ? Procedure: INCISION AND DRAINAGE RIGHT KNEE;  Surgeon: Carole Civil, MD;  Location: [facility];  Service: Orthopedics;  Laterality: Right;  ? IR 3D INDEPENDENT WKST  10/11/2021  ? IR ANGIOGRAM SELECTIVE EACH ADDITIONAL VESSEL  10/11/2021  ? IR ANGIOGRAM SELECTIVE EACH ADDITIONAL VESSEL  10/11/2021  ? IR ANGIOGRAM SELECTIVE EACH ADDITIONAL VESSEL  10/11/2021  ? IR ANGIOGRAM SELECTIVE EACH ADDITIONAL VESSEL  10/11/2021  ? IR ANGIOGRAM SELECTIVE EACH ADDITIONAL VESSEL  10/11/2021  ? IR ANGIOGRAM SELECTIVE EACH ADDITIONAL VESSEL  10/11/2021  ? IR ANGIOGRAM SELECTIVE EACH ADDITIONAL VESSEL  10/25/2021  ? IR  ANGIOGRAM SELECTIVE EACH ADDITIONAL VESSEL  10/25/2021  ? IR ANGIOGRAM VISCERAL SELECTIVE  10/11/2021  ? IR EMBO TUMOR ORGAN ISCHEMIA INFARCT INC GUIDE ROADMAPPING  10/25/2021  ? IR EMBO TUMOR ORGAN ISCHEMIA INFARCT INC GUIDE ROADMAPPING  10/25/2021  ? IR RADIOLOGIST EVAL & MGMT  08/23/2021  ? IR RADIOLOGIST EVAL & MGMT  11/21/2021  ? IR US GUIDE VASC ACCESS RIGHT  10/11/2021  ? IR US GUIDE VASC ACCESS RIGHT  10/25/2021  ? MANDIBLE SURGERY    ? QUADRICEPS TENDON REPAIR Right 09/04/2021  ? Procedure: REPAIR QUADRICEP TENDON;  Surgeon: Carole Civil, MD;  Location: [facility];  Service: Orthopedics;  Laterality: Right;  ? TENDON EXPLORATION Right 11/02/2021  ? Procedure: QUADRICEPS TENDON AND PATELLAR TENDON EXPLORATION;  Surgeon: Carole Civil, MD;  Location: [facility];  Service: Orthopedics;  Laterality: Right;  ? ? ?There were no vitals filed for this visit. ? ? ? ? ? ? ? ? ? ? ? ? ? ? Wound Therapy - 12/11/21 1048   ? ? Subjective Pt states his leg is itchy but really not hurting today.   ? Patient and Family Stated Goals wound to heal   ? Date of Onset 09/04/21   ? Prior Treatments dressing changes at MD office   ? Pain Scale  0-10   ? Pain Score 0-No pain   ? Evaluation and Treatment Procedures Explained to Patient/Family Yes   ? Evaluation and Treatment Procedures agreed to   ? Wound Properties Date First Assessed: 12/07/21 Wound Type: Other (Comment) Location: Ankle Location Orientation: Posterior Wound Description (Comments): wound superior posterior ankle Present on Admission: No  ? Dressing Type Gauze (Comment);Tape dressing   medihoney, vaseline perimeter 2x2, medipore tape  ? Dressing Changed Changed   ? Dressing Status Old drainage   ? Dressing Change Frequency PRN   ? Site / Wound Assessment Brown;Dusky   ? % Wound base Red or Granulating 20%   ? % Wound base Yellow/Fibrinous Exudate 60%   ? % Wound base Black/Eschar 20%   ? Peri-wound Assessment Erythema (blanchable)   ? Drainage Amount Minimal   ? Drainage  Description Serosanguineous   ? Treatment Cleansed   ? Wound Properties Date First Assessed: 12/07/21 Wound Type: Other (Comment) Location: Ankle Location Orientation: Posterior Wound Description (Comments): inferior wound posterior ankle Present on Admission: No  ? Dressing Type Gauze (Comment);Tape dressing   medihoney, vaseline perimeter 2x2, medipore tape  ? Dressing Changed Changed   ? Dressing Status Old drainage   ? Dressing Change Frequency PRN   ? Site / Wound Assessment Yellow   ? % Wound base Red or Granulating 10%   ? % Wound base Yellow/Fibrinous Exudate 90%   ? Peri-wound Assessment Intact;Erythema (blanchable)   ? Drainage Amount Minimal   ? Drainage Description Serosanguineous   ? Treatment Cleansed;Debridement (Selective)   ? Wound Properties Date First Assessed: 12/04/21 Time First Assessed: 1010 Location: Pretibial Location Orientation: Proximal;Right Present on Admission: -- , unsure   ? Dressing Type Gauze (Comment);Tape dressing   medihoney, vaseline perimeter 2x2, medipore tape  ? Dressing Changed Changed   ? Dressing Status Old drainage   ? Dressing Change Frequency PRN   ? Site / Wound Assessment Dry;Black;Yellow   ? % Wound base Red or Granulating 10%   ? % Wound base Yellow/Fibrinous Exudate 90%   ? Peri-wound Assessment Erythema (blanchable)   ? Margins Unattached edges (unapproximated)   ? Drainage Amount Minimal   ? Treatment Cleansed;Debridement (Selective)   ? Wound Properties Date First Assessed: 12/04/21 Time First Assessed: 1010 Location: Ankle Location Orientation: Right;Lateral Present on Admission: No  ? Dressing Type Gauze (Comment);Tape dressing   medihoney, vaseline perimeter 2x2, medipore tape  ? Dressing Changed Changed   ? Dressing Status Old drainage   ? Dressing Change Frequency PRN   ? Site / Wound Assessment Dry;Brown;Yellow   ? % Wound base Red or Granulating 10%   ? % Wound base Yellow/Fibrinous Exudate 90%   ? Peri-wound Assessment Erythema (blanchable)   ? Margins  Unattached edges (unapproximated)   ? Drainage Amount Minimal   ? Drainage Description Serosanguineous   ? Treatment Cleansed;Debridement (Selective)   ? Wound Properties Date First Assessed: 12/04/21 Location: Toe (Comment  which one) Location Orientation: Anterior;Right Wound Description (Comments): Great Toe Present on Admission: No  ? Dressing Type Gauze (Comment);Tape dressing   medihoney, vaseline perimeter 2x2, medipore tape  ? Dressing Status Intact   ? Dressing Change Frequency PRN   ? Site / Wound Assessment Painful;Pink;Yellow   ? % Wound base Red or Granulating 0%   ? % Wound base Yellow/Fibrinous Exudate 100%   ? Peri-wound Assessment Erythema (blanchable)   ? Margins Unattached edges (unapproximated)   ? Drainage Amount Minimal   ? Drainage Description  Serosanguineous   ? Treatment Cleansed;Debridement (Selective)   ? Wound Properties Date First Assessed: 11/21/21 Time First Assessed: 1130 Wound Type: Incision - Dehisced Location: Knee Location Orientation: Anterior;Right Present on Admission: Yes  ? Dressing Type Gauze (Comment)   ? Dressing Changed Changed   ? Dressing Status Dry   ? Dressing Change Frequency PRN   ? Site / Wound Assessment Red;Yellow;Black;Dry   ? % Wound base Red or Granulating 70%   ? % Wound base Yellow/Fibrinous Exudate 30%   ? Peri-wound Assessment Erythema (blanchable)   ? Margins Epibole (rolled edges)   ? Drainage Amount Minimal   ? Drainage Description Serosanguineous   ? Treatment Cleansed;Debridement (Selective)   ? Selective Debridement - Location Multiple wound anterior and lateral Rt LE, cleansed and dressings for posterior ankle   ? Selective Debridement - Tools Used Forceps;Scalpel   ? Selective Debridement - Tissue Removed slough   ? Wound Therapy - Clinical Statement All wounds continue to improve with reduction in slough/eschar and slow gains in granulation.  Pt tolerates debridement well.  Continued with medihoney to all wounds with vaseling applied to periemter.   No edema or signs of infection present.   ? Wound Therapy - Functional Problem List gait, bathing, dressing   ? Factors Delaying/Impairing Wound Healing Diabetes Mellitus;Immobility;Multiple medical p

## 2021-12-12 ENCOUNTER — Other Ambulatory Visit: Payer: Self-pay | Admitting: Orthopedic Surgery

## 2021-12-12 ENCOUNTER — Ambulatory Visit (INDEPENDENT_AMBULATORY_CARE_PROVIDER_SITE_OTHER): Payer: Medicaid Other | Admitting: Orthopedic Surgery

## 2021-12-12 DIAGNOSIS — T8149XA Infection following a procedure, other surgical site, initial encounter: Secondary | ICD-10-CM

## 2021-12-12 DIAGNOSIS — S76111D Strain of right quadriceps muscle, fascia and tendon, subsequent encounter: Secondary | ICD-10-CM

## 2021-12-12 DIAGNOSIS — Z9889 Other specified postprocedural states: Secondary | ICD-10-CM

## 2021-12-12 DIAGNOSIS — T8189XD Other complications of procedures, not elsewhere classified, subsequent encounter: Secondary | ICD-10-CM

## 2021-12-12 MED ORDER — OXYCODONE-ACETAMINOPHEN 10-325 MG PO TABS: 1.0000 | ORAL_TABLET | ORAL | 0 refills | Status: DC | PRN

## 2021-12-12 MED ORDER — SULFAMETHOXAZOLE-TRIMETHOPRIM 800-160 MG PO TABS: 1.0000 | ORAL_TABLET | Freq: Two times a day (BID) | ORAL | 0 refills | Status: DC

## 2021-12-12 NOTE — Addendum Note (Signed)
Addended by: Moreen Fowler R on: 12/12/2021 10:46 AM ? ? Modules accepted: Orders ? ?

## 2021-12-12 NOTE — Progress Notes (Signed)
FOLLOW UP  ? ?Encounter Diagnoses  ?Name Primary?  ? Non-healing surgical wound, subsequent encounter Yes  ? Cellulitis, wound, post-operative   ? Rupture of right quadriceps tendon, subsequent encounter repair 09/04/21   ? Status post incision and drainage 11/02/2021 right knee   ? ? ? ?Chief Complaint  ?Patient presents with  ? Post-op Follow-up  ?  Right- cellulitis wound check Dos 09/04/21 11/02/21 ?  ? ? ? ?Danny [name] is receiving wound care and is here for wound check.  He is requiring excessive amounts of oxycodone for pain control ? ?The patient has multiple areas now that are opened up and I am going to culture them understanding we may get back mixed flora ? ?He is on sulfamethoxazole which should have good staph coverage in this area ? ?If necessary we will add medication or change medication terms of antibiotics ? ?Follow-up in 3 weeks ? ?Meds ordered this encounter  ?Medications  ? sulfamethoxazole-trimethoprim (BACTRIM DS) 800-160 MG tablet  ?  Sig: Take 1 tablet by mouth 2 (two) times daily.  ?  Dispense:  56 tablet  ?  Refill:  0  ? oxyCODONE-acetaminophen (PERCOCET) 10-325 MG tablet  ?  Sig: Take 1 tablet by mouth every 4 (four) hours as needed for pain.  ?  Dispense:  30 tablet  ?  Refill:  0  ? ? ?

## 2021-12-13 ENCOUNTER — Ambulatory Visit (HOSPITAL_COMMUNITY): Payer: Medicaid Other | Admitting: Physical Therapy

## 2021-12-13 DIAGNOSIS — R2689 Other abnormalities of gait and mobility: Secondary | ICD-10-CM

## 2021-12-13 DIAGNOSIS — T8149XA Infection following a procedure, other surgical site, initial encounter: Secondary | ICD-10-CM

## 2021-12-13 DIAGNOSIS — S81801A Unspecified open wound, right lower leg, initial encounter: Secondary | ICD-10-CM

## 2021-12-13 NOTE — Therapy (Signed)
[facility] ?[address] ?[place], [place], [address] ?Phone: [phone]   Fax:  [phone] ? ?Wound Care Therapy ? ?Patient Details  ?Name: Danny [name] ?MRN: [id number] ?Date of Birth: [birth year] ?Referring Provider (PT): Arther Abbott MD ? ? ?Encounter Date: 12/13/2021 ? ? PT End of Session - 12/13/21 1650   ? ? Visit Number 7   ? Number of Visits 16   ? Date for PT Re-Evaluation 01/16/22   ? Authorization Type Medicaid - [employer]   ? Authorization Time Period [employer] approved 12 visits from 11/21/2021-02/19/2022   ? Authorization - Visit Number 7   ? Authorization - Number of Visits 12   ? PT Start Time 1615   ? PT Stop Time 6433   ? PT Time Calculation (min) 35 min   ? Activity Tolerance Patient tolerated treatment well   ? Behavior During Therapy [facility] for tasks assessed/performed   ? ?  ?  ? ?  ? ? ?Past Medical History:  ?Diagnosis Date  ? Alcohol abuse   ? Anxiety   ? Back pain   ? CVA (cerebral infarction)   ? Depression   ? Diabetes mellitus   ? Hypertension   ? Migraines   ? Stroke [facility])   ? ? ?Past Surgical History:  ?Procedure Laterality Date  ? CAST APPLICATION Right 2/95/1884  ? Procedure: CAST APPLICATION;  Surgeon: Carole Civil, MD;  Location: [facility];  Service: Orthopedics;  Laterality: Right;  ? FOOT SURGERY    ? INCISION AND DRAINAGE Right 11/02/2021  ? Procedure: INCISION AND DRAINAGE RIGHT KNEE;  Surgeon: Carole Civil, MD;  Location: [facility];  Service: Orthopedics;  Laterality: Right;  ? IR 3D INDEPENDENT WKST  10/11/2021  ? IR ANGIOGRAM SELECTIVE EACH ADDITIONAL VESSEL  10/11/2021  ? IR ANGIOGRAM SELECTIVE EACH ADDITIONAL VESSEL  10/11/2021  ? IR ANGIOGRAM SELECTIVE EACH ADDITIONAL VESSEL  10/11/2021  ? IR ANGIOGRAM SELECTIVE EACH ADDITIONAL VESSEL  10/11/2021  ? IR ANGIOGRAM SELECTIVE EACH ADDITIONAL VESSEL  10/11/2021  ? IR ANGIOGRAM SELECTIVE EACH ADDITIONAL VESSEL  10/11/2021  ? IR ANGIOGRAM SELECTIVE EACH ADDITIONAL VESSEL  10/25/2021  ? IR  ANGIOGRAM SELECTIVE EACH ADDITIONAL VESSEL  10/25/2021  ? IR ANGIOGRAM VISCERAL SELECTIVE  10/11/2021  ? IR EMBO TUMOR ORGAN ISCHEMIA INFARCT INC GUIDE ROADMAPPING  10/25/2021  ? IR EMBO TUMOR ORGAN ISCHEMIA INFARCT INC GUIDE ROADMAPPING  10/25/2021  ? IR RADIOLOGIST EVAL & MGMT  08/23/2021  ? IR RADIOLOGIST EVAL & MGMT  11/21/2021  ? IR US GUIDE VASC ACCESS RIGHT  10/11/2021  ? IR US GUIDE VASC ACCESS RIGHT  10/25/2021  ? MANDIBLE SURGERY    ? QUADRICEPS TENDON REPAIR Right 09/04/2021  ? Procedure: REPAIR QUADRICEP TENDON;  Surgeon: Carole Civil, MD;  Location: [facility];  Service: Orthopedics;  Laterality: Right;  ? TENDON EXPLORATION Right 11/02/2021  ? Procedure: QUADRICEPS TENDON AND PATELLAR TENDON EXPLORATION;  Surgeon: Carole Civil, MD;  Location: [facility];  Service: Orthopedics;  Laterality: Right;  ? ? ?There were no vitals filed for this visit. ? ? ? ? ? ? ? ? ? ? ? ? ? ? Wound Therapy - 12/13/21 0001   ? ? Subjective PT states that he has liver cancer.   ? Patient and Family Stated Goals wound to heal   ? Date of Onset 09/04/21   ? Prior Treatments dressing changes at MD office   ? Pain Scale 0-10   ?  Pain Score 0-No pain   ? Evaluation and Treatment Procedures Explained to Patient/Family Yes   ? Evaluation and Treatment Procedures agreed to   ? Wound Properties Date First Assessed: 12/07/21 Wound Type: Other (Comment) Location: Ankle Location Orientation: Posterior Wound Description (Comments): wound superior posterior ankle Present on Admission: No  ? Dressing Type Gauze (Comment);Tape dressing   medihoney, vaseline perimeter 2x2, medipore tape  ? Dressing Changed Changed   ? Dressing Status Old drainage   ? Dressing Change Frequency PRN   ? Site / Wound Assessment Brown;Dusky   ? % Wound base Red or Granulating 25%   ? % Wound base Yellow/Fibrinous Exudate 55%   ? % Wound base Black/Eschar 20%   ? Peri-wound Assessment Erythema (blanchable)   ? Drainage Amount Minimal   ? Treatment Cleansed   ? Wound  Properties Date First Assessed: 12/07/21 Wound Type: Other (Comment) Location: Ankle Location Orientation: Posterior Wound Description (Comments): inferior wound posterior ankle Present on Admission: No  ? Dressing Type Gauze (Comment);Tape dressing   medihoney, vaseline perimeter 2x2, medipore tape  ? Dressing Changed Changed   ? Dressing Status Old drainage   ? Dressing Change Frequency PRN   ? Site / Wound Assessment Yellow   ? % Wound base Red or Granulating 10%   ? % Wound base Yellow/Fibrinous Exudate 90%   ? Peri-wound Assessment Intact;Erythema (blanchable)   ? Drainage Amount Scant   ? Drainage Description Serous   ? Treatment Cleansed;Debridement (Selective)   ? Wound Properties Date First Assessed: 12/04/21 Time First Assessed: 1010 Location: Pretibial Location Orientation: Proximal;Right Present on Admission: -- , unsure   ? Dressing Type Gauze (Comment);Tape dressing   medihoney, vaseline perimeter 2x2, medipore tape  ? Dressing Changed Changed   ? Dressing Status Old drainage   ? Dressing Change Frequency PRN   ? Site / Wound Assessment Dry;Black;Yellow   ? % Wound base Red or Granulating 10%   ? % Wound base Yellow/Fibrinous Exudate 90%   ? Peri-wound Assessment Erythema (blanchable)   ? Margins Unattached edges (unapproximated)   ? Drainage Amount Minimal   ? Drainage Description Sanguineous   ? Treatment Cleansed;Debridement (Selective)   ? Wound Properties Date First Assessed: 12/04/21 Time First Assessed: 1010 Location: Ankle Location Orientation: Right;Lateral Present on Admission: No  ? Dressing Type Gauze (Comment);Tape dressing   medihoney, vaseline perimeter 2x2, medipore tape  ? Dressing Changed Changed   ? Dressing Status Old drainage   ? Dressing Change Frequency PRN   ? Site / Wound Assessment Dry;Brown;Yellow   ? % Wound base Red or Granulating 10%   ? % Wound base Yellow/Fibrinous Exudate 90%   ? Peri-wound Assessment Erythema (blanchable)   ? Margins Unattached edges (unapproximated)    ? Drainage Amount Scant   ? Drainage Description Serosanguineous   ? Treatment Cleansed;Debridement (Selective)   ? Wound Properties Date First Assessed: 12/04/21 Location: Toe (Comment  which one) Location Orientation: Anterior;Right Wound Description (Comments): Great Toe Present on Admission: No  ? Dressing Type Gauze (Comment);Tape dressing   medihoney, vaseline perimeter 2x2, medipore tape  ? Dressing Status Intact   ? Dressing Change Frequency PRN   ? Site / Wound Assessment Painful;Pink;Yellow   ? % Wound base Red or Granulating 5%   ? % Wound base Yellow/Fibrinous Exudate 95%   ? Peri-wound Assessment Erythema (blanchable)   ? Margins Unattached edges (unapproximated)   ? Drainage Amount Scant   ? Treatment Cleansed;Debridement (Selective)   ?  Wound Properties Date First Assessed: 11/21/21 Time First Assessed: 1130 Wound Type: Incision - Dehisced Location: Knee Location Orientation: Anterior;Right Present on Admission: Yes  ? Dressing Type Gauze (Comment)   ? Dressing Status Dry   ? Dressing Change Frequency PRN   ? Site / Wound Assessment Red;Yellow;Black;Dry   ? % Wound base Red or Granulating 75%   ? % Wound base Yellow/Fibrinous Exudate 25%   ? Peri-wound Assessment Erythema (blanchable)   ? Margins Epibole (rolled edges)   ? Drainage Amount Minimal   ? Drainage Description Serous   ? Treatment Cleansed;Debridement (Selective)   ? Selective Debridement - Location Multiple wound anterior and lateral Rt LE, cleansed and dressings for posterior ankle   ? Selective Debridement - Tools Used Forceps;Scalpel   ? Selective Debridement - Tissue Removed slough   ? Wound Therapy - Clinical Statement Pt continues to slowly increase in granulation but slough and eschar on wounds are very adherent.   ? Wound Therapy - Functional Problem List gait, bathing, dressing   ? Factors Delaying/Impairing Wound Healing Diabetes Mellitus;Immobility;Multiple medical problems;Tobacco use   ? Hydrotherapy Plan Debridement;Dressing  change;Electrical stimulation;Patient/family education;Pulsatile lavage with suction   ? Wound Therapy - Frequency 2X / week   ? Wound Therapy - Current Recommendations PT   ? Wound Plan debride and dre

## 2021-12-16 LAB — AEROBIC CULTURE
AER RESULT:: NO GROWTH
AER RESULT:: NO GROWTH
MICRO NUMBER:: 13225883
MICRO NUMBER:: 13225884
SPECIMEN QUALITY:: ADEQUATE
SPECIMEN QUALITY:: ADEQUATE

## 2021-12-16 LAB — DUPLICATE REPORT

## 2021-12-16 LAB — HOUSE ACCOUNT TRACKING

## 2021-12-18 ENCOUNTER — Other Ambulatory Visit: Payer: Self-pay

## 2021-12-18 ENCOUNTER — Ambulatory Visit (HOSPITAL_COMMUNITY): Payer: Medicaid Other | Admitting: Physical Therapy

## 2021-12-18 ENCOUNTER — Encounter (HOSPITAL_COMMUNITY): Payer: Self-pay | Admitting: Physical Therapy

## 2021-12-18 DIAGNOSIS — T8149XA Infection following a procedure, other surgical site, initial encounter: Secondary | ICD-10-CM

## 2021-12-18 DIAGNOSIS — T8189XD Other complications of procedures, not elsewhere classified, subsequent encounter: Secondary | ICD-10-CM

## 2021-12-18 DIAGNOSIS — R2689 Other abnormalities of gait and mobility: Secondary | ICD-10-CM | POA: Diagnosis not present

## 2021-12-18 DIAGNOSIS — S81801A Unspecified open wound, right lower leg, initial encounter: Secondary | ICD-10-CM

## 2021-12-18 NOTE — Therapy (Signed)
[place] [facility] ?[address] ?[place], [place], [address] ?Phone: [phone]   Fax:  [phone] ? ?Wound Care Therapy ? ?Patient Details  ?Name: Danny [name] ?MRN: [id number] ?Date of Birth: 1966-03-31 ?Referring Provider (PT): Arther Abbott MD ? ? ?Encounter Date: 12/18/2021 ? ? PT End of Session - 12/18/21 1006   ? ? Visit Number 8   ? Number of Visits 16   ? Date for PT Re-Evaluation 01/16/22   ? Authorization Type Medicaid - [employer]   ? Authorization Time Period [employer] approved 12 visits from 11/21/2021-02/19/2022   ? Authorization - Visit Number 8   ? Authorization - Number of Visits 12   ? PT Start Time 1006   ? PT Stop Time 1052   ? PT Time Calculation (min) 46 min   ? Activity Tolerance Patient tolerated treatment well   ? Behavior During Therapy [facility] for tasks assessed/performed   ? ?  ?  ? ?  ? ? ?Past Medical History:  ?Diagnosis Date  ? Alcohol abuse   ? Anxiety   ? Back pain   ? CVA (cerebral infarction)   ? Depression   ? Diabetes mellitus   ? Hypertension   ? Migraines   ? Stroke [facility])   ? ? ?Past Surgical History:  ?Procedure Laterality Date  ? CAST APPLICATION Right 6/96/2952  ? Procedure: CAST APPLICATION;  Surgeon: Carole Civil, MD;  Location: [facility];  Service: Orthopedics;  Laterality: Right;  ? FOOT SURGERY    ? INCISION AND DRAINAGE Right 11/02/2021  ? Procedure: INCISION AND DRAINAGE RIGHT KNEE;  Surgeon: Carole Civil, MD;  Location: [facility];  Service: Orthopedics;  Laterality: Right;  ? IR 3D INDEPENDENT WKST  10/11/2021  ? IR ANGIOGRAM SELECTIVE EACH ADDITIONAL VESSEL  10/11/2021  ? IR ANGIOGRAM SELECTIVE EACH ADDITIONAL VESSEL  10/11/2021  ? IR ANGIOGRAM SELECTIVE EACH ADDITIONAL VESSEL  10/11/2021  ? IR ANGIOGRAM SELECTIVE EACH ADDITIONAL VESSEL  10/11/2021  ? IR ANGIOGRAM SELECTIVE EACH ADDITIONAL VESSEL  10/11/2021  ? IR ANGIOGRAM SELECTIVE EACH ADDITIONAL VESSEL  10/11/2021  ? IR ANGIOGRAM SELECTIVE EACH ADDITIONAL VESSEL  10/25/2021  ? IR  ANGIOGRAM SELECTIVE EACH ADDITIONAL VESSEL  10/25/2021  ? IR ANGIOGRAM VISCERAL SELECTIVE  10/11/2021  ? IR EMBO TUMOR ORGAN ISCHEMIA INFARCT INC GUIDE ROADMAPPING  10/25/2021  ? IR EMBO TUMOR ORGAN ISCHEMIA INFARCT INC GUIDE ROADMAPPING  10/25/2021  ? IR RADIOLOGIST EVAL & MGMT  08/23/2021  ? IR RADIOLOGIST EVAL & MGMT  11/21/2021  ? IR US GUIDE VASC ACCESS RIGHT  10/11/2021  ? IR US GUIDE VASC ACCESS RIGHT  10/25/2021  ? MANDIBLE SURGERY    ? QUADRICEPS TENDON REPAIR Right 09/04/2021  ? Procedure: REPAIR QUADRICEP TENDON;  Surgeon: Carole Civil, MD;  Location: [facility];  Service: Orthopedics;  Laterality: Right;  ? TENDON EXPLORATION Right 11/02/2021  ? Procedure: QUADRICEPS TENDON AND PATELLAR TENDON EXPLORATION;  Surgeon: Carole Civil, MD;  Location: [facility];  Service: Orthopedics;  Laterality: Right;  ? ? ?There were no vitals filed for this visit. ? ? ? ? ? ? ? ? ? ? ? ? ? ? Wound Therapy - 12/18/21 0001   ? ? Subjective Leg was really bloody after last time   ? Patient and Family Stated Goals wound to heal   ? Date of Onset 09/04/21   ? Prior Treatments dressing changes at MD office   ? Pain Score 0-No pain   ?  Evaluation and Treatment Procedures Explained to Patient/Family Yes   ? Evaluation and Treatment Procedures agreed to   ? Wound Properties Date First Assessed: 12/07/21 Wound Type: Other (Comment) Location: Ankle Location Orientation: Posterior Wound Description (Comments): wound superior posterior ankle Present on Admission: No  ? Dressing Type Gauze (Comment);Tape dressing   medihoney, vaseline perimeter 2x2, medipore tape  ? Dressing Changed Changed   ? Dressing Status Old drainage   ? Dressing Change Frequency PRN   ? Site / Wound Assessment Brown;Dusky   ? % Wound base Red or Granulating 70%   ? % Wound base Yellow/Fibrinous Exudate 30%   ? Peri-wound Assessment Erythema (blanchable)   ? Drainage Amount Minimal   ? Treatment Cleansed;Debridement (Selective)   ? Wound Properties Date First  Assessed: 12/07/21 Wound Type: Other (Comment) Location: Ankle Location Orientation: Posterior Wound Description (Comments): inferior wound posterior ankle Present on Admission: No  ? Dressing Type Gauze (Comment);Tape dressing   medihoney, vaseline perimeter 2x2, medipore tape  ? Dressing Status Old drainage   ? Dressing Change Frequency PRN   ? Site / Wound Assessment Yellow   ? % Wound base Red or Granulating 60%   ? % Wound base Yellow/Fibrinous Exudate 40%   ? Peri-wound Assessment Intact;Erythema (blanchable)   ? Drainage Amount Scant   ? Drainage Description Serous   ? Treatment Cleansed;Debridement (Selective)   ? Wound Properties Date First Assessed: 12/04/21 Time First Assessed: 1010 Location: Pretibial Location Orientation: Proximal;Right Present on Admission: -- , unsure   ? Dressing Type Gauze (Comment);Tape dressing   medihoney, vaseline perimeter 2x2, medipore tape  ? Dressing Changed Changed   ? Dressing Status Old drainage   ? Dressing Change Frequency PRN   ? Site / Wound Assessment Dry;Black;Yellow   ? % Wound base Yellow/Fibrinous Exudate 100%   ? Peri-wound Assessment Erythema (blanchable)   ? Margins Unattached edges (unapproximated)   ? Drainage Amount Minimal   ? Drainage Description Sanguineous   ? Treatment Cleansed;Debridement (Selective)   ? Wound Properties Date First Assessed: 12/04/21 Time First Assessed: 1010 Location: Ankle Location Orientation: Right;Lateral Present on Admission: No  ? Dressing Type Gauze (Comment);Tape dressing   medihoney, vaseline perimeter 2x2, medipore tape  ? Dressing Changed Changed   ? Dressing Status Old drainage   ? Dressing Change Frequency PRN   ? Site / Wound Assessment Dry;Brown;Yellow   ? % Wound base Red or Granulating 10%   ? % Wound base Yellow/Fibrinous Exudate 90%   ? Peri-wound Assessment Erythema (blanchable)   ? Margins Unattached edges (unapproximated)   ? Drainage Amount Scant   ? Drainage Description Serosanguineous   ? Treatment  Cleansed;Debridement (Selective)   ? Wound Properties Date First Assessed: 12/04/21 Location: Toe (Comment  which one) Location Orientation: Anterior;Right Wound Description (Comments): Great Toe Present on Admission: No  ? Dressing Type Gauze (Comment);Tape dressing   medihoney, vaseline perimeter 2x2, medipore tape  ? Dressing Status Intact   ? Dressing Change Frequency PRN   ? Site / Wound Assessment Painful;Pink;Yellow   ? % Wound base Red or Granulating 5%   ? % Wound base Yellow/Fibrinous Exudate 100%   ? Peri-wound Assessment Erythema (blanchable)   ? Margins Unattached edges (unapproximated)   ? Drainage Amount Scant   ? Treatment Cleansed;Debridement (Selective)   ? Wound Properties Date First Assessed: 11/21/21 Time First Assessed: 1130 Wound Type: Incision - Dehisced Location: Knee Location Orientation: Anterior;Right Present on Admission: Yes  ? Dressing Type Gauze (Comment)   ?  Dressing Status Dry   ? Dressing Change Frequency PRN   ? Site / Wound Assessment Red;Yellow;Black;Dry   ? % Wound base Red or Granulating 75%   ? % Wound base Yellow/Fibrinous Exudate 25%   ? Peri-wound Assessment Erythema (blanchable)   ? Margins Epibole (rolled edges)   ? Drainage Amount Minimal   ? Treatment Cleansed;Debridement (Selective)   ? Selective Debridement - Location Wound beds and periwounds   ? Selective Debridement - Tools Used Forceps;Scalpel   ? Selective Debridement - Tissue Removed slough   ? Wound Therapy - Clinical Statement Able to remove eschar and most of slough from posterior ankle wounds. Very adherent to eschar to lateral ankle and slough at anterior shin. Patient with RLE edema in ankle. Switched to profore lite following medihoney and 2x 2s to all LE wounds. medihoney to 4x4 and kerlix to knee with netting covering LE.   ? Wound Therapy - Functional Problem List gait, bathing, dressing   ? Factors Delaying/Impairing Wound Healing Diabetes Mellitus;Immobility;Multiple medical problems;Tobacco use   ?  Hydrotherapy Plan Debridement;Dressing change;Electrical stimulation;Patient/family education;Pulsatile lavage with suction   ? Wound Therapy - Frequency 2X / week   ? Wound Therapy - Current Recommendations PT   ? Wo

## 2021-12-18 NOTE — Telephone Encounter (Signed)
Patient is out of  ?Oxycodone-Acetaminophen 10/325 MG  Qty 30 Tablets ?Take 1 tablet by mouth every 4 (four) hours as needed for pain.  ? ?Bactrim DS 800-160 MG  Qty 56 Tablets ?Take 1 tablet by mouth 2(two)times daily. ? ? ?PATIENT USES [facility] ?

## 2021-12-19 MED ORDER — OXYCODONE-ACETAMINOPHEN 10-325 MG PO TABS: 1.0000 | ORAL_TABLET | ORAL | 0 refills | Status: DC | PRN

## 2021-12-19 MED ORDER — SULFAMETHOXAZOLE-TRIMETHOPRIM 800-160 MG PO TABS: 1.0000 | ORAL_TABLET | Freq: Two times a day (BID) | ORAL | 0 refills | Status: DC

## 2021-12-20 ENCOUNTER — Encounter (HOSPITAL_COMMUNITY): Payer: Self-pay | Admitting: Physical Therapy

## 2021-12-20 ENCOUNTER — Ambulatory Visit (HOSPITAL_COMMUNITY): Payer: Medicaid Other | Admitting: Physical Therapy

## 2021-12-20 DIAGNOSIS — S81801A Unspecified open wound, right lower leg, initial encounter: Secondary | ICD-10-CM

## 2021-12-20 DIAGNOSIS — R2689 Other abnormalities of gait and mobility: Secondary | ICD-10-CM

## 2021-12-20 DIAGNOSIS — T8149XA Infection following a procedure, other surgical site, initial encounter: Secondary | ICD-10-CM

## 2021-12-20 NOTE — Therapy (Signed)
[facility] ?[address] ?[place], [place], [address] ?Phone: [phone]   Fax:  [phone] ? ?Wound Care Therapy ? ?Patient Details  ?Name: Danny [name] ?MRN: [id number] ?Date of Birth: 16-Nov-1965 ?Referring Provider (PT): Arther Abbott MD ? ? ?Encounter Date: 12/20/2021 ? ? PT End of Session - 12/20/21 1459   ? ? Visit Number 9   ? Number of Visits 16   ? Date for PT Re-Evaluation 01/16/22   ? Authorization Type Medicaid - [employer]   ? Authorization Time Period [employer] approved 12 visits from 11/21/2021-02/19/2022   ? Authorization - Visit Number 9   ? Authorization - Number of Visits 12   ? Progress Note Due on Visit 10   ? PT Start Time 1010   ? PT Stop Time 1055   ? PT Time Calculation (min) 45 min   ? Activity Tolerance Patient tolerated treatment well   ? Behavior During Therapy [facility] for tasks assessed/performed   ? ?  ?  ? ?  ? ? ?Past Medical History:  ?Diagnosis Date  ? Alcohol abuse   ? Anxiety   ? Back pain   ? CVA (cerebral infarction)   ? Depression   ? Diabetes mellitus   ? Hypertension   ? Migraines   ? Stroke [facility])   ? ? ?Past Surgical History:  ?Procedure Laterality Date  ? CAST APPLICATION Right 0/16/0109  ? Procedure: CAST APPLICATION;  Surgeon: Carole Civil, MD;  Location: [facility];  Service: Orthopedics;  Laterality: Right;  ? FOOT SURGERY    ? INCISION AND DRAINAGE Right 11/02/2021  ? Procedure: INCISION AND DRAINAGE RIGHT KNEE;  Surgeon: Carole Civil, MD;  Location: [facility];  Service: Orthopedics;  Laterality: Right;  ? IR 3D INDEPENDENT WKST  10/11/2021  ? IR ANGIOGRAM SELECTIVE EACH ADDITIONAL VESSEL  10/11/2021  ? IR ANGIOGRAM SELECTIVE EACH ADDITIONAL VESSEL  10/11/2021  ? IR ANGIOGRAM SELECTIVE EACH ADDITIONAL VESSEL  10/11/2021  ? IR ANGIOGRAM SELECTIVE EACH ADDITIONAL VESSEL  10/11/2021  ? IR ANGIOGRAM SELECTIVE EACH ADDITIONAL VESSEL  10/11/2021  ? IR ANGIOGRAM SELECTIVE EACH ADDITIONAL VESSEL  10/11/2021  ? IR ANGIOGRAM SELECTIVE EACH  ADDITIONAL VESSEL  10/25/2021  ? IR ANGIOGRAM SELECTIVE EACH ADDITIONAL VESSEL  10/25/2021  ? IR ANGIOGRAM VISCERAL SELECTIVE  10/11/2021  ? IR EMBO TUMOR ORGAN ISCHEMIA INFARCT INC GUIDE ROADMAPPING  10/25/2021  ? IR EMBO TUMOR ORGAN ISCHEMIA INFARCT INC GUIDE ROADMAPPING  10/25/2021  ? IR RADIOLOGIST EVAL & MGMT  08/23/2021  ? IR RADIOLOGIST EVAL & MGMT  11/21/2021  ? IR US GUIDE VASC ACCESS RIGHT  10/11/2021  ? IR US GUIDE VASC ACCESS RIGHT  10/25/2021  ? MANDIBLE SURGERY    ? QUADRICEPS TENDON REPAIR Right 09/04/2021  ? Procedure: REPAIR QUADRICEP TENDON;  Surgeon: Carole Civil, MD;  Location: [facility];  Service: Orthopedics;  Laterality: Right;  ? TENDON EXPLORATION Right 11/02/2021  ? Procedure: QUADRICEPS TENDON AND PATELLAR TENDON EXPLORATION;  Surgeon: Carole Civil, MD;  Location: [facility];  Service: Orthopedics;  Laterality: Right;  ? ? ?There were no vitals filed for this visit. ? ? ? ? ? ? ? ? ? ? ? ? ? ? Wound Therapy - 12/20/21 1500   ? ? Subjective pt states the bandage slid down and his knee has been exposed since.   ? Patient and Family Stated Goals wound to heal   ? Date of Onset 09/04/21   ? Prior  Treatments dressing changes at MD office   ? Pain Scale 0-10   ? Pain Score 0-No pain   ? Evaluation and Treatment Procedures Explained to Patient/Family Yes   ? Evaluation and Treatment Procedures agreed to   ? Wound Properties Date First Assessed: 12/07/21 Wound Type: Other (Comment) Location: Ankle Location Orientation: Posterior Wound Description (Comments): wound superior posterior ankle Present on Admission: No  ? Wound Image Images linked: 1   ? Dressing Type Gauze (Comment);Tape dressing   medihoney, vaseline perimeter 2x2, medipore tape  ? Dressing Changed Changed   ? Dressing Status Old drainage   ? Dressing Change Frequency PRN   ? Site / Wound Assessment Brown;Dusky   ? % Wound base Red or Granulating 90%   ? % Wound base Yellow/Fibrinous Exudate 10%   ? Peri-wound Assessment Erythema  (blanchable)   ? Wound Length (cm) 0.3 cm   was 1.4  ? Wound Width (cm) 0.3 cm   was 0.4  ? Wound Depth (cm) 0 cm   ? Wound Volume (cm^3) 0 cm^3   ? Wound Surface Area (cm^2) 0.09 cm^2   ? Margins Attached edges (approximated)   ? Drainage Amount Scant   ? Drainage Description Serous   ? Treatment Cleansed   ? Wound Properties Date First Assessed: 12/07/21 Wound Type: Other (Comment) Location: Ankle Location Orientation: Posterior Wound Description (Comments): inferior wound posterior ankle Present on Admission: No  ? Wound Image --   see above, larger of the two  ? Dressing Type Gauze (Comment);Tape dressing   medihoney, vaseline perimeter 2x2, medipore tape  ? Dressing Changed Changed   ? Dressing Status Old drainage   ? Dressing Change Frequency PRN   ? Site / Wound Assessment Yellow   ? % Wound base Red or Granulating 90%   ? % Wound base Yellow/Fibrinous Exudate 10%   ? Peri-wound Assessment Intact;Erythema (blanchable)   ? Wound Length (cm) 0.8 cm   was 0.8  ? Wound Width (cm) 0.4 cm   was 0.4  ? Wound Depth (cm) 0 cm   ? Wound Volume (cm^3) 0 cm^3   ? Wound Surface Area (cm^2) 0.32 cm^2   ? Drainage Amount Scant   ? Drainage Description Serous   ? Treatment Cleansed   ? Wound Properties Date First Assessed: 12/04/21 Time First Assessed: 1010 Location: Pretibial Location Orientation: Proximal;Right Present on Admission: -- , unsure   ? Wound Image Images linked: 1   ? Dressing Type Gauze (Comment);Tape dressing   medihoney, vaseline perimeter 2x2, medipore tape  ? Dressing Changed Changed   ? Dressing Status Old drainage   ? Dressing Change Frequency PRN   ? Site / Wound Assessment Dry;Black;Yellow   ? % Wound base Red or Granulating 5%   ? % Wound base Yellow/Fibrinous Exudate 95%   ? Peri-wound Assessment Erythema (blanchable)   ? Wound Length (cm) 1 cm   was 1.3 cm  ? Wound Width (cm) 0.5 cm   was 0.8cm  ? Wound Surface Area (cm^2) 0.5 cm^2   ? Margins Unattached edges (unapproximated)   ? Drainage Amount  Minimal   ? Drainage Description Serosanguineous   ? Treatment Cleansed;Debridement (Selective)   ? Wound Properties Date First Assessed: 12/04/21 Time First Assessed: 1010 Location: Ankle Location Orientation: Right;Lateral Present on Admission: No  ? Wound Image Images linked: 1   ? Dressing Type Gauze (Comment);Tape dressing   medihoney, vaseline perimeter 2x2, medipore tape  ? Dressing Changed Changed   ?  Dressing Status Old drainage   ? Dressing Change Frequency PRN   ? Site / Wound Assessment Dry;Brown;Yellow   ? % Wound base Red or Granulating 10%   ? % Wound base Yellow/Fibrinous Exudate 90%   ? Peri-wound Assessment Erythema (blanchable)   ? Wound Length (cm) 1 cm   was 1.4 cm  ? Wound Width (cm) 1 cm   was 1.2 cm  ? Wound Surface Area (cm^2) 1 cm^2   ? Margins Unattached edges (unapproximated)   ? Drainage Amount Minimal   ? Drainage Description Serosanguineous   ? Treatment Cleansed;Debridement (Selective)   ? Wound Properties Date First Assessed: 12/04/21 Location: Toe (Comment  which one) Location Orientation: Anterior;Right Wound Description (Comments): Great Toe Present on Admission: No  ? Wound Image Images linked: 1   ? Dressing Type Gauze (Comment);Tape dressing   medihoney, vaseline perimeter 2x2, medipore tape  ? Dressing Status Intact   ? Dressing Change Frequency PRN   ? Site / Wound Assessment Painful;Pink;Yellow   ? % Wound base Red or Granulating 10%   ? % Wound base Yellow/Fibrinous Exudate 90%   ? Peri-wound Assessment Erythema (blanchable)   ? Wound Length (cm) 0.4 cm   was 1 cm  ? Wound Width (cm) 0.4 cm   was 0.6 cm  ? Wound Surface Area (cm^2) 0.16 cm^2   ? Margins Unattached edges (unapproximated)   ? Drainage Amount Minimal   ? Drainage Description Serosanguineous   ? Treatment Cleansed;Debridement (Selective)   ? Wound Properties Date First Assessed: 11/21/21 Time First Assessed: 1130 Wound Type: Incision - Dehisced Location: Knee Location Orientation: Anterior;Right Present on  Admission: Yes  ? Wound Image Images linked: 1   ? Dressing Type Gauze (Comment)   ? Dressing Changed Changed   ? Dressing Status Dry   ? Dressing Change Frequency PRN   ? Site / Wound Assessment Red;Yellow;Black;Dr

## 2021-12-25 ENCOUNTER — Ambulatory Visit (HOSPITAL_COMMUNITY): Payer: Medicaid Other

## 2021-12-25 ENCOUNTER — Encounter (HOSPITAL_COMMUNITY): Payer: Self-pay

## 2021-12-25 ENCOUNTER — Telehealth: Payer: Self-pay | Admitting: Orthopedic Surgery

## 2021-12-25 ENCOUNTER — Other Ambulatory Visit: Payer: Self-pay | Admitting: Interventional Radiology

## 2021-12-25 DIAGNOSIS — S81801A Unspecified open wound, right lower leg, initial encounter: Secondary | ICD-10-CM

## 2021-12-25 DIAGNOSIS — T8149XA Infection following a procedure, other surgical site, initial encounter: Secondary | ICD-10-CM

## 2021-12-25 DIAGNOSIS — R2689 Other abnormalities of gait and mobility: Secondary | ICD-10-CM | POA: Diagnosis not present

## 2021-12-25 DIAGNOSIS — C220 Liver cell carcinoma: Secondary | ICD-10-CM

## 2021-12-25 NOTE — Therapy (Addendum)
[facility] ?[address] ?[place], [place], [address] ?Phone: [phone]   Fax:  [phone] ? ?Wound [facility] Therapy ? ?Progress Note ?Reporting Period 11/21/21 to 12/25/21 ? ?See note below for Objective Data and Assessment of Progress/Goals.  ? ? ? ?Patient Details  ?Name: Danny [name] ?MRN: [id number] ?Date of Birth: October 03, 1965 ?Referring Provider (PT): Arther Abbott MD ? ? ?Encounter Date: 12/25/2021 ? ? PT End of Session - 12/25/21 1809   ? ? Visit Number 10   ? Number of Visits 16   ? Date for PT Re-Evaluation 01/16/22   ? Authorization Type Medicaid - [employer]   ? Authorization Time Period [employer] approved 12 visits from 11/21/2021-02/19/2022   ? Authorization - Visit Number 10   ? Authorization - Number of Visits 12   ? Progress Note Due on Visit 10   ? PT Start Time 3818   ? PT Stop Time 1750   ? PT Time Calculation (min) 45 min   ? Activity Tolerance Patient tolerated treatment well   ? Behavior During Therapy [facility] for tasks assessed/performed   ? ?  ?  ? ?  ? ? ?Past Medical History:  ?Diagnosis Date  ? Alcohol abuse   ? Anxiety   ? Back pain   ? CVA (cerebral infarction)   ? Depression   ? Diabetes mellitus   ? Hypertension   ? Migraines   ? Stroke [facility])   ? ? ?Past Surgical History:  ?Procedure Laterality Date  ? CAST APPLICATION Right 2/99/3716  ? Procedure: CAST APPLICATION;  Surgeon: Carole Civil, MD;  Location: [facility];  Service: Orthopedics;  Laterality: Right;  ? FOOT SURGERY    ? INCISION AND DRAINAGE Right 11/02/2021  ? Procedure: INCISION AND DRAINAGE RIGHT KNEE;  Surgeon: Carole Civil, MD;  Location: [facility];  Service: Orthopedics;  Laterality: Right;  ? IR 3D INDEPENDENT WKST  10/11/2021  ? IR ANGIOGRAM SELECTIVE EACH ADDITIONAL VESSEL  10/11/2021  ? IR ANGIOGRAM SELECTIVE EACH ADDITIONAL VESSEL  10/11/2021  ? IR ANGIOGRAM SELECTIVE EACH ADDITIONAL VESSEL  10/11/2021  ? IR ANGIOGRAM SELECTIVE EACH ADDITIONAL VESSEL  10/11/2021  ? IR ANGIOGRAM  SELECTIVE EACH ADDITIONAL VESSEL  10/11/2021  ? IR ANGIOGRAM SELECTIVE EACH ADDITIONAL VESSEL  10/11/2021  ? IR ANGIOGRAM SELECTIVE EACH ADDITIONAL VESSEL  10/25/2021  ? IR ANGIOGRAM SELECTIVE EACH ADDITIONAL VESSEL  10/25/2021  ? IR ANGIOGRAM VISCERAL SELECTIVE  10/11/2021  ? IR EMBO TUMOR ORGAN ISCHEMIA INFARCT INC GUIDE ROADMAPPING  10/25/2021  ? IR EMBO TUMOR ORGAN ISCHEMIA INFARCT INC GUIDE ROADMAPPING  10/25/2021  ? IR RADIOLOGIST EVAL & MGMT  08/23/2021  ? IR RADIOLOGIST EVAL & MGMT  11/21/2021  ? IR US GUIDE VASC ACCESS RIGHT  10/11/2021  ? IR US GUIDE VASC ACCESS RIGHT  10/25/2021  ? MANDIBLE SURGERY    ? QUADRICEPS TENDON REPAIR Right 09/04/2021  ? Procedure: REPAIR QUADRICEP TENDON;  Surgeon: Carole Civil, MD;  Location: [facility];  Service: Orthopedics;  Laterality: Right;  ? TENDON EXPLORATION Right 11/02/2021  ? Procedure: QUADRICEPS TENDON AND PATELLAR TENDON EXPLORATION;  Surgeon: Carole Civil, MD;  Location: [facility];  Service: Orthopedics;  Laterality: Right;  ? ? ?There were no vitals filed for this visit. ? ? ? Subjective Assessment - 12/25/21 1758   ? ? Subjective Pt reports of fall onto knee over weekend, increased pain today.   ? Currently in Pain? Yes   ? Pain Score 8    ?  Pain Location Knee   ? Pain Orientation Right   ? Pain Descriptors / Indicators Sore;Burning   ? Pain Type Surgical pain   ? ?  ?  ? ?  ? ? ? ? ? ? ? ? ? ? ? ? Wound Therapy - 12/25/21 1758   ? ? Subjective Pt reports of fall onto knee over weekend, increased pain today.   ? Patient and Family Stated Goals wound to heal   ? Date of Onset 09/04/21   ? Prior Treatments dressing changes at MD office   ? Pain Scale 0-10   ? Evaluation and Treatment Procedures Explained to Patient/Family Yes   ? Evaluation and Treatment Procedures agreed to   ? Wound Properties Date First Assessed: 12/07/21 Wound Type: Other (Comment) Location: Ankle Location Orientation: Posterior Wound Description (Comments): wound superior posterior ankle Present  on Admission: No  ? Dressing Type Bismuth petroleum   ? Dressing Changed Changed   ? Dressing Status Old drainage   ? Dressing Change Frequency PRN   ? Site / Wound Assessment Granulation tissue   ? % Wound base Red or Granulating 95%   ? % Wound base Yellow/Fibrinous Exudate 5%   ? Peri-wound Assessment Erythema (blanchable)   ? Wound Length (cm) 0.3 cm   was 1.4  ? Wound Width (cm) 0.3 cm   was .4  ? Wound Surface Area (cm^2) 0.09 cm^2   ? Margins Attached edges (approximated)   ? Drainage Amount Scant   ? Drainage Description Serous   ? Treatment Cleansed   ? Wound Properties Date First Assessed: 12/07/21 Wound Type: Other (Comment) Location: Ankle Location Orientation: Posterior Wound Description (Comments): inferior wound posterior ankle Present on Admission: No  ? Dressing Type Bismuth petroleum   ? Dressing Changed Changed   ? Dressing Status Old drainage   ? Dressing Change Frequency PRN   ? Site / Wound Assessment Granulation tissue;Yellow   ? % Wound base Red or Granulating 95%   ? % Wound base Yellow/Fibrinous Exudate 5%   ? Peri-wound Assessment Intact;Erythema (blanchable)   ? Wound Length (cm) 0.8 cm   was .8  ? Wound Width (cm) 0.4 cm   was .4  ? Wound Surface Area (cm^2) 0.32 cm^2   ? Drainage Amount Scant   ? Drainage Description Serous   ? Treatment Cleansed   ? Wound Properties Date First Assessed: 12/04/21 Time First Assessed: 1010 Location: Pretibial Location Orientation: Proximal;Right Present on Admission: -- , unsure   ? Dressing Type Gauze (Comment);Compression wrap   Vaseline perimeter, medihoney, 2x2, profore lite  ? Dressing Changed Changed   ? Dressing Status Old drainage   ? Dressing Change Frequency PRN   ? Site / Wound Assessment Dry;Black;Yellow   ? % Wound base Red or Granulating 5%   ? % Wound base Yellow/Fibrinous Exudate 95%   ? Peri-wound Assessment Erythema (blanchable)   ? Wound Length (cm) 1 cm   was 1.3  ? Wound Width (cm) 0.5 cm   was .8cm  ? Wound Surface Area (cm^2) 0.5  cm^2   ? Margins Unattached edges (unapproximated)   ? Drainage Amount Minimal   ? Drainage Description Serosanguineous   ? Treatment Cleansed;Debridement (Selective)   ? Wound Properties Date First Assessed: 12/04/21 Time First Assessed: 1010 Location: Ankle Location Orientation: Right;Lateral Present on Admission: No  ? Dressing Type Gauze (Comment);Compression wrap   Vaseline perimeter, medihoney, 2x2, profore lite  ? Dressing Changed Changed   ?  Dressing Status Old drainage   ? Dressing Change Frequency PRN   ? Site / Wound Assessment Dry;Brown;Yellow   ? % Wound base Red or Granulating 10%   ? % Wound base Yellow/Fibrinous Exudate 90%   ? Peri-wound Assessment Erythema (blanchable)   ? Wound Length (cm) 1 cm   was 1.4  ? Wound Width (cm) 1 cm   was 1.2  ? Wound Surface Area (cm^2) 1 cm^2   ? Margins Unattached edges (unapproximated)   ? Drainage Amount Scant   ? Drainage Description Serosanguineous   ? Treatment Cleansed;Debridement (Selective)   ? Wound Properties Date First Assessed: 12/04/21 Location: Toe (Comment  which one) Location Orientation: Anterior;Right Wound Description (Comments): Great Toe Present on Admission: No  ? Dressing Type Gauze (Comment);Compression wrap   Vaseline perimeter, medihoney, 2x2, profore lite  ? Dressing Status Intact   ? Dressing Change Frequency PRN   ? Site / Wound Assessment Painful;Pink;Yellow   ? % Wound base Red or Granulating 10%   ? % Wound base Yellow/Fibrinous Exudate 90%   ? Peri-wound Assessment Erythema (blanchable)   ? Wound Length (cm) 0.4 cm   was 1cm  ? Wound Width (cm) 0.4 cm   was .6  ? Wound Surface Area (cm^2) 0.16 cm^2   ? Margins Unattached edges (unapproximated)   ? Drainage Amount Scant   ? Drainage Description Serosanguineous   ? Treatment Cleansed;Debridement (Selective)   ? Wound Properties Date First Assessed: 11/21/21 Time First Assessed: 1130 Wound Type: Incision - Dehisced Location: Knee Location Orientation: Anterior;Right Present on  Admission: Yes  ? Dressing Type Gauze (Comment)   Vaseline perimeter, medihoney, 2x2, profore lite  ? Dressing Changed Changed   ? Dressing Status Dry   ? Dressing Change Frequency PRN   ? Site / Wound Assessment

## 2021-12-25 NOTE — Telephone Encounter (Signed)
Patient niece called requesting refill on his pain medicine and antibiotic  ? ?oxyCODONE-acetaminophen (PERCOCET) 10-325 MG tablet ? ?And she doesn't know what antibiotic he wants him to have this week  ? ? ?Pharmacy:  [facility] in [place]  ? ?

## 2021-12-26 ENCOUNTER — Other Ambulatory Visit: Payer: Self-pay

## 2021-12-26 DIAGNOSIS — T8189XD Other complications of procedures, not elsewhere classified, subsequent encounter: Secondary | ICD-10-CM

## 2021-12-26 MED ORDER — OXYCODONE-ACETAMINOPHEN 10-325 MG PO TABS: 1.0000 | ORAL_TABLET | ORAL | 0 refills | Status: DC | PRN

## 2021-12-26 NOTE — Telephone Encounter (Signed)
Request sent to provider.

## 2021-12-27 ENCOUNTER — Ambulatory Visit (HOSPITAL_COMMUNITY): Payer: Medicaid Other | Admitting: Physical Therapy

## 2021-12-27 DIAGNOSIS — T8149XA Infection following a procedure, other surgical site, initial encounter: Secondary | ICD-10-CM

## 2021-12-27 DIAGNOSIS — S81801A Unspecified open wound, right lower leg, initial encounter: Secondary | ICD-10-CM

## 2021-12-27 DIAGNOSIS — R2689 Other abnormalities of gait and mobility: Secondary | ICD-10-CM | POA: Diagnosis not present

## 2021-12-27 NOTE — Therapy (Signed)
[facility] ?[address] ?[place], [place], [address] ?Phone: [phone]   Fax:  [phone] ? ?Wound Care Therapy ? ?Patient Details  ?Name: Danny [name] ?MRN: [id number] ?Date of Birth: [birth year] ?Referring Provider (PT): Arther Abbott MD ? ? ?Encounter Date: 12/27/2021 ? ? PT End of Session - 12/27/21 0947   ? ? Visit Number 11   ? Number of Visits 16   ? Date for PT Re-Evaluation 01/16/22   ? Authorization Type Medicaid - [employer]   ? Authorization Time Period [employer] approved 12 visits from 11/21/2021-02/19/2022   ? Authorization - Visit Number 11  sent new request on 4/20  ? Authorization - Number of Visits 12   ? Progress Note Due on Visit 10   ? PT Start Time 0830   ? PT Stop Time 0910   ? PT Time Calculation (min) 40 min   ? Activity Tolerance Patient tolerated treatment well   ? Behavior During Therapy [facility] for tasks assessed/performed   ? ?  ?  ? ?  ? ? ?Past Medical History:  ?Diagnosis Date  ? Alcohol abuse   ? Anxiety   ? Back pain   ? CVA (cerebral infarction)   ? Depression   ? Diabetes mellitus   ? Hypertension   ? Migraines   ? Stroke [facility])   ? ? ?Past Surgical History:  ?Procedure Laterality Date  ? CAST APPLICATION Right 0/96/2836  ? Procedure: CAST APPLICATION;  Surgeon: Carole Civil, MD;  Location: [facility];  Service: Orthopedics;  Laterality: Right;  ? FOOT SURGERY    ? INCISION AND DRAINAGE Right 11/02/2021  ? Procedure: INCISION AND DRAINAGE RIGHT KNEE;  Surgeon: Carole Civil, MD;  Location: [facility];  Service: Orthopedics;  Laterality: Right;  ? IR 3D INDEPENDENT WKST  10/11/2021  ? IR ANGIOGRAM SELECTIVE EACH ADDITIONAL VESSEL  10/11/2021  ? IR ANGIOGRAM SELECTIVE EACH ADDITIONAL VESSEL  10/11/2021  ? IR ANGIOGRAM SELECTIVE EACH ADDITIONAL VESSEL  10/11/2021  ? IR ANGIOGRAM SELECTIVE EACH ADDITIONAL VESSEL  10/11/2021  ? IR ANGIOGRAM SELECTIVE EACH ADDITIONAL VESSEL  10/11/2021  ? IR ANGIOGRAM SELECTIVE EACH ADDITIONAL VESSEL  10/11/2021  ? IR  ANGIOGRAM SELECTIVE EACH ADDITIONAL VESSEL  10/25/2021  ? IR ANGIOGRAM SELECTIVE EACH ADDITIONAL VESSEL  10/25/2021  ? IR ANGIOGRAM VISCERAL SELECTIVE  10/11/2021  ? IR EMBO TUMOR ORGAN ISCHEMIA INFARCT INC GUIDE ROADMAPPING  10/25/2021  ? IR EMBO TUMOR ORGAN ISCHEMIA INFARCT INC GUIDE ROADMAPPING  10/25/2021  ? IR RADIOLOGIST EVAL & MGMT  08/23/2021  ? IR RADIOLOGIST EVAL & MGMT  11/21/2021  ? IR US GUIDE VASC ACCESS RIGHT  10/11/2021  ? IR US GUIDE VASC ACCESS RIGHT  10/25/2021  ? MANDIBLE SURGERY    ? QUADRICEPS TENDON REPAIR Right 09/04/2021  ? Procedure: REPAIR QUADRICEP TENDON;  Surgeon: Carole Civil, MD;  Location: [facility];  Service: Orthopedics;  Laterality: Right;  ? TENDON EXPLORATION Right 11/02/2021  ? Procedure: QUADRICEPS TENDON AND PATELLAR TENDON EXPLORATION;  Surgeon: Carole Civil, MD;  Location: [facility];  Service: Orthopedics;  Laterality: Right;  ? ? ?There were no vitals filed for this visit. ? ? ? ? ? ? ? ? ? ? ? ? ? ? Wound Therapy - 12/27/21 0001   ? ? Subjective Pt reports of fall onto knee over weekend, increased pain today.   ? Patient and Family Stated Goals wound to heal   ? Date of Onset 09/04/21   ?  Prior Treatments dressing changes at MD office   ? Pain Scale 0-10   ? Pain Score 0-No pain   just with debridement  ? Evaluation and Treatment Procedures Explained to Patient/Family Yes   ? Evaluation and Treatment Procedures agreed to   ? Wound Properties Date First Assessed: 12/07/21 Wound Type: Other (Comment) Location: Ankle Location Orientation: Posterior Wound Description (Comments): wound superior posterior ankle Present on Admission: No  ? Dressing Type Bismuth petroleum   ? Dressing Changed Changed   ? Dressing Status Old drainage   ? Dressing Change Frequency PRN   ? Site / Wound Assessment Granulation tissue   ? % Wound base Red or Granulating 100%   was 0  ? Peri-wound Assessment Erythema (blanchable)   ? Wound Length (cm) 0.3 cm   was 1.3  ? Wound Width (cm) 0.3 cm   was .4  ?  Wound Surface Area (cm^2) 0.09 cm^2   ? Margins Attached edges (approximated)   ? Drainage Amount Scant   ? Drainage Description Serous   ? Treatment Cleansed   ? Wound Properties Date First Assessed: 12/07/21 Wound Type: Other (Comment) Location: Ankle Location Orientation: Posterior Wound Description (Comments): inferior wound posterior ankle Present on Admission: No  ? Dressing Type Bismuth petroleum   ? Dressing Status Old drainage   ? Dressing Change Frequency PRN   ? Site / Wound Assessment Granulation tissue;Yellow   ? % Wound base Red or Granulating 100%   ? % Wound base Yellow/Fibrinous Exudate 0%   ? Peri-wound Assessment Intact;Erythema (blanchable)   ? Wound Length (cm) 0.6 cm   was .8  ? Wound Width (cm) 0.4 cm   wa .4  ? Wound Surface Area (cm^2) 0.24 cm^2   ? Drainage Amount Scant   ? Drainage Description Serous   ? Treatment Cleansed   ? Wound Properties Date First Assessed: 12/04/21 Time First Assessed: 1010 Location: Pretibial Location Orientation: Proximal;Right Present on Admission: -- , unsure   ? Dressing Type Gauze (Comment);Compression wrap   Vaseline perimeter, medihoney, 2x2, profore lite  ? Dressing Status Old drainage   ? Dressing Change Frequency PRN   ? Site / Wound Assessment Dry;Black;Yellow   ? % Wound base Red or Granulating 10%   ? % Wound base Yellow/Fibrinous Exudate 80%   ? Peri-wound Assessment --   ? Wound Length (cm) 1 cm   was 1.3  ? Wound Width (cm) 0.5 cm   was .8  ? Wound Surface Area (cm^2) 0.5 cm^2   ? Margins Unattached edges (unapproximated)   ? Drainage Amount Minimal   ? Drainage Description Sanguineous   ? Treatment Cleansed;Debridement (Selective)   ? Wound Properties Date First Assessed: 12/04/21 Time First Assessed: 1010 Location: Ankle Location Orientation: Right;Lateral Present on Admission: No  ? Dressing Type Gauze (Comment);Compression wrap   Vaseline perimeter, medihoney, 2x2, profore lite  ? Dressing Status Old drainage   ? Dressing Change Frequency PRN    ? Site / Wound Assessment Dry;Brown;Yellow   ? % Wound base Red or Granulating 20%   was 0%  ? % Wound base Yellow/Fibrinous Exudate 80%   ? Peri-wound Assessment Erythema (blanchable)   ? Wound Length (cm) 1 cm   was 1.4  ? Wound Width (cm) 1 cm   was 1.2  ? Wound Surface Area (cm^2) 1 cm^2   ? Margins Unattached edges (unapproximated)   ? Drainage Amount Scant   ? Drainage Description Serous   ? Wound  Properties Date First Assessed: 12/04/21 Location: Toe (Comment  which one) Location Orientation: Anterior;Right Wound Description (Comments): Great Toe Present on Admission: No  ? Dressing Type Gauze (Comment);Compression wrap   Vaseline perimeter, medihoney, 2x2, profore lite  ? Dressing Status Intact   ? Dressing Change Frequency PRN   ? Site / Wound Assessment Painful;Pink;Yellow   ? % Wound base Red or Granulating 20%   was 0  ? % Wound base Yellow/Fibrinous Exudate 80%   ? Peri-wound Assessment Erythema (blanchable)   ? Wound Length (cm) 0.4 cm   ? Wound Width (cm) 0.4 cm   ? Wound Surface Area (cm^2) 0.16 cm^2   ? Margins Unattached edges (unapproximated)   ? Drainage Amount None   ? Treatment Cleansed;Debridement (Selective)   ? Wound Properties Date First Assessed: 11/21/21 Time First Assessed: 1130 Wound Type: Incision - Dehisced Location: Knee Location Orientation: Anterior;Right Present on Admission: Yes  ? Dressing Type Gauze (Comment)   Vaseline perimeter, medihoney, 2x2, profore lite  ? Dressing Status Dry   ? Dressing Change Frequency PRN   ? Site / Wound Assessment Red;Yellow   ? % Wound base Red or Granulating 90%   was 45  ? % Wound base Yellow/Fibrinous Exudate 10%   ? Peri-wound Assessment Erythema (blanchable)   ? Wound Length (cm) 4.4 cm   ? Wound Width (cm) 3.3 cm   ? Wound Surface Area (cm^2) 14.52 cm^2   ? Margins Epibole (rolled edges)   ? Drainage Amount Moderate   ? Drainage Description Serosanguineous   ? Treatment Cleansed;Debridement (Selective)   ? Selective Debridement - Location  Wound beds and periwounds   ? Selective Debridement - Tools Used Forceps;Scalpel   ? Selective Debridement - Tissue Removed slough   ? Wound Therapy - Clinical Statement All wounds are improving in gra

## 2021-12-27 NOTE — Therapy (Signed)
[facility] ?[address] ?[place], [place], [address] ?Phone: [phone]   Fax:  [phone] ? ?Wound Care Therapy ? ?Patient Details  ?Name: Danny [name] ?MRN: [id number] ?Date of Birth: 20-Oct-1965 ?Referring Provider (PT): Arther Abbott MD ? ? ?Encounter Date: 12/27/2021 ? ? PT End of Session - 12/27/21 9379   ? ? Visit Number 11   ? Number of Visits 16   ? Date for PT Re-Evaluation 01/16/22   ? Authorization Type Medicaid - [employer]   ? Authorization Time Period [employer] approved 12 visits from 11/21/2021-02/19/2022   ? Authorization - Visit Number 11   ? Authorization - Number of Visits 12   ? Progress Note Due on Visit 10   ? PT Start Time 0830   ? PT Stop Time 0910   ? PT Time Calculation (min) 40 min   ? Activity Tolerance Patient tolerated treatment well   ? Behavior During Therapy [facility] for tasks assessed/performed   ? ?  ?  ? ?  ? ? ?Past Medical History:  ?Diagnosis Date  ? Alcohol abuse   ? Anxiety   ? Back pain   ? CVA (cerebral infarction)   ? Depression   ? Diabetes mellitus   ? Hypertension   ? Migraines   ? Stroke [facility])   ? ? ?Past Surgical History:  ?Procedure Laterality Date  ? CAST APPLICATION Right 0/24/0973  ? Procedure: CAST APPLICATION;  Surgeon: Carole Civil, MD;  Location: [facility];  Service: Orthopedics;  Laterality: Right;  ? FOOT SURGERY    ? INCISION AND DRAINAGE Right 11/02/2021  ? Procedure: INCISION AND DRAINAGE RIGHT KNEE;  Surgeon: Carole Civil, MD;  Location: [facility];  Service: Orthopedics;  Laterality: Right;  ? IR 3D INDEPENDENT WKST  10/11/2021  ? IR ANGIOGRAM SELECTIVE EACH ADDITIONAL VESSEL  10/11/2021  ? IR ANGIOGRAM SELECTIVE EACH ADDITIONAL VESSEL  10/11/2021  ? IR ANGIOGRAM SELECTIVE EACH ADDITIONAL VESSEL  10/11/2021  ? IR ANGIOGRAM SELECTIVE EACH ADDITIONAL VESSEL  10/11/2021  ? IR ANGIOGRAM SELECTIVE EACH ADDITIONAL VESSEL  10/11/2021  ? IR ANGIOGRAM SELECTIVE EACH ADDITIONAL VESSEL  10/11/2021  ? IR ANGIOGRAM SELECTIVE EACH  ADDITIONAL VESSEL  10/25/2021  ? IR ANGIOGRAM SELECTIVE EACH ADDITIONAL VESSEL  10/25/2021  ? IR ANGIOGRAM VISCERAL SELECTIVE  10/11/2021  ? IR EMBO TUMOR ORGAN ISCHEMIA INFARCT INC GUIDE ROADMAPPING  10/25/2021  ? IR EMBO TUMOR ORGAN ISCHEMIA INFARCT INC GUIDE ROADMAPPING  10/25/2021  ? IR RADIOLOGIST EVAL & MGMT  08/23/2021  ? IR RADIOLOGIST EVAL & MGMT  11/21/2021  ? IR US GUIDE VASC ACCESS RIGHT  10/11/2021  ? IR US GUIDE VASC ACCESS RIGHT  10/25/2021  ? MANDIBLE SURGERY    ? QUADRICEPS TENDON REPAIR Right 09/04/2021  ? Procedure: REPAIR QUADRICEP TENDON;  Surgeon: Carole Civil, MD;  Location: [facility];  Service: Orthopedics;  Laterality: Right;  ? TENDON EXPLORATION Right 11/02/2021  ? Procedure: QUADRICEPS TENDON AND PATELLAR TENDON EXPLORATION;  Surgeon: Carole Civil, MD;  Location: [facility];  Service: Orthopedics;  Laterality: Right;  ? ? ?There were no vitals filed for this visit. ? ? ? ? ? ? ? ? ? ? ? ? ? ? Wound Therapy - 12/27/21 0001   ? ? Subjective Pt reports of fall onto knee over weekend, increased pain today.   ? Patient and Family Stated Goals wound to heal   ? Date of Onset 09/04/21   ? Prior Treatments dressing  changes at MD office   ? Pain Scale 0-10   ? Pain Score 0-No pain   just with debridement  ? Evaluation and Treatment Procedures Explained to Patient/Family Yes   ? Evaluation and Treatment Procedures agreed to   ? Wound Properties Date First Assessed: 12/07/21 Wound Type: Other (Comment) Location: Ankle Location Orientation: Posterior Wound Description (Comments): wound superior posterior ankle Present on Admission: No  ? Dressing Type Bismuth petroleum   ? Dressing Changed Changed   ? Dressing Status Old drainage   ? Dressing Change Frequency PRN   ? Site / Wound Assessment Granulation tissue   ? % Wound base Red or Granulating 100%   was 0  ? Peri-wound Assessment Erythema (blanchable)   ? Wound Length (cm) 0.3 cm   was 1.3  ? Wound Width (cm) 0.3 cm   was .4  ? Wound Surface Area  (cm^2) 0.09 cm^2   ? Margins Attached edges (approximated)   ? Drainage Amount Scant   ? Drainage Description Serous   ? Treatment Cleansed   ? Wound Properties Date First Assessed: 12/07/21 Wound Type: Other (Comment) Location: Ankle Location Orientation: Posterior Wound Description (Comments): inferior wound posterior ankle Present on Admission: No  ? Dressing Type Bismuth petroleum   ? Dressing Status Old drainage   ? Dressing Change Frequency PRN   ? Site / Wound Assessment Granulation tissue;Yellow   ? % Wound base Red or Granulating 100%   ? % Wound base Yellow/Fibrinous Exudate 0%   ? Peri-wound Assessment Intact;Erythema (blanchable)   ? Wound Length (cm) 0.6 cm   was .8  ? Wound Width (cm) 0.4 cm   wa .4  ? Wound Surface Area (cm^2) 0.24 cm^2   ? Drainage Amount Scant   ? Drainage Description Serous   ? Treatment Cleansed   ? Wound Properties Date First Assessed: 12/04/21 Time First Assessed: 1010 Location: Pretibial Location Orientation: Proximal;Right Present on Admission: -- , unsure   ? Dressing Type Gauze (Comment);Compression wrap   Vaseline perimeter, medihoney, 2x2, profore lite  ? Dressing Status Old drainage   ? Dressing Change Frequency PRN   ? Site / Wound Assessment Dry;Black;Yellow   ? % Wound base Red or Granulating 10%   ? % Wound base Yellow/Fibrinous Exudate 80%   ? Peri-wound Assessment --   ? Wound Length (cm) 1 cm   was 1.3  ? Wound Width (cm) 0.5 cm   was .8  ? Wound Surface Area (cm^2) 0.5 cm^2   ? Margins Unattached edges (unapproximated)   ? Drainage Amount Minimal   ? Drainage Description Sanguineous   ? Treatment Cleansed;Debridement (Selective)   ? Wound Properties Date First Assessed: 12/04/21 Time First Assessed: 1010 Location: Ankle Location Orientation: Right;Lateral Present on Admission: No  ? Dressing Type Gauze (Comment);Compression wrap   Vaseline perimeter, medihoney, 2x2, profore lite  ? Dressing Status Old drainage   ? Dressing Change Frequency PRN   ? Site / Wound  Assessment Dry;Brown;Yellow   ? % Wound base Red or Granulating 20%   was 0%  ? % Wound base Yellow/Fibrinous Exudate 80%   ? Peri-wound Assessment Erythema (blanchable)   ? Wound Length (cm) 1 cm   was 1.4  ? Wound Width (cm) 1 cm   was 1.2  ? Wound Surface Area (cm^2) 1 cm^2   ? Margins Unattached edges (unapproximated)   ? Drainage Amount Scant   ? Drainage Description Serous   ? Wound Properties Date First  Assessed: 12/04/21 Location: Toe (Comment  which one) Location Orientation: Anterior;Right Wound Description (Comments): Great Toe Present on Admission: No  ? Dressing Type Gauze (Comment);Compression wrap   Vaseline perimeter, medihoney, 2x2, profore lite  ? Dressing Status Intact   ? Dressing Change Frequency PRN   ? Site / Wound Assessment Painful;Pink;Yellow   ? % Wound base Red or Granulating 20%   was 0  ? % Wound base Yellow/Fibrinous Exudate 80%   ? Peri-wound Assessment Erythema (blanchable)   ? Wound Length (cm) 0.4 cm   ? Wound Width (cm) 0.4 cm   ? Wound Surface Area (cm^2) 0.16 cm^2   ? Margins Unattached edges (unapproximated)   ? Drainage Amount None   ? Treatment Cleansed;Debridement (Selective)   ? Wound Properties Date First Assessed: 11/21/21 Time First Assessed: 1130 Wound Type: Incision - Dehisced Location: Knee Location Orientation: Anterior;Right Present on Admission: Yes  ? Dressing Type Gauze (Comment)   Vaseline perimeter, medihoney, 2x2, profore lite  ? Dressing Status Dry   ? Dressing Change Frequency PRN   ? Site / Wound Assessment Red;Yellow   ? % Wound base Red or Granulating 90%   was 45  ? % Wound base Yellow/Fibrinous Exudate 10%   ? Peri-wound Assessment Erythema (blanchable)   ? Wound Length (cm) 4.4 cm   ? Wound Width (cm) 3.3 cm   ? Wound Surface Area (cm^2) 14.52 cm^2   ? Margins Epibole (rolled edges)   ? Drainage Amount Moderate   ? Drainage Description Serosanguineous   ? Treatment Cleansed;Debridement (Selective)   ? Selective Debridement - Location Wound beds and  periwounds   ? Selective Debridement - Tools Used Forceps;Scalpel   ? Selective Debridement - Tissue Removed slough   ? Wound Therapy - Clinical Statement All wounds are improving in granulation and size.  Pt wi

## 2021-12-31 ENCOUNTER — Ambulatory Visit (INDEPENDENT_AMBULATORY_CARE_PROVIDER_SITE_OTHER): Payer: Medicaid Other | Admitting: Orthopedic Surgery

## 2021-12-31 DIAGNOSIS — T8149XA Infection following a procedure, other surgical site, initial encounter: Secondary | ICD-10-CM

## 2021-12-31 DIAGNOSIS — T8189XD Other complications of procedures, not elsewhere classified, subsequent encounter: Secondary | ICD-10-CM

## 2021-12-31 MED ORDER — OXYCODONE-ACETAMINOPHEN 10-325 MG PO TABS: 1.0000 | ORAL_TABLET | ORAL | 0 refills | Status: DC | PRN

## 2021-12-31 MED ORDER — SULFAMETHOXAZOLE-TRIMETHOPRIM 800-160 MG PO TABS: 1.0000 | ORAL_TABLET | Freq: Two times a day (BID) | ORAL | 0 refills | Status: DC

## 2021-12-31 NOTE — Progress Notes (Signed)
FOLLOW UP  ? ?Encounter Diagnoses  ?Name Primary?  ? Non-healing surgical wound, subsequent encounter   ? Cellulitis, wound, post-operative   ? ? ? ?Chief Complaint  ?Patient presents with  ? Follow-up  ?  Recheck on right knee, DOS 11-02-21.  ? ? ? ?Danny [name] continues to get wound care his wound was 4 x 3 cm superficial red some fibrinous exudate he also has a wound over his tibia and there is 1 over his proximal leg as well.  He is going to wound care ? ?His cultures were negative ? ?I suspect his smoking is contributing to his difficult wound healing as well as his poor vascular flow from smoking ? ?Recommend continue wound care ? ?New pictures are in the media section ? ?Meds ordered this encounter  ?Medications  ? sulfamethoxazole-trimethoprim (BACTRIM DS) 800-160 MG tablet  ?  Sig: Take 1 tablet by mouth 2 (two) times daily.  ?  Dispense:  56 tablet  ?  Refill:  0  ? oxyCODONE-acetaminophen (PERCOCET) 10-325 MG tablet  ?  Sig: Take 1 tablet by mouth every 4 (four) hours as needed for pain.  ?  Dispense:  30 tablet  ?  Refill:  0  ? ? ?

## 2022-01-01 ENCOUNTER — Encounter (HOSPITAL_COMMUNITY): Payer: Self-pay

## 2022-01-01 ENCOUNTER — Ambulatory Visit (HOSPITAL_COMMUNITY): Payer: Medicaid Other

## 2022-01-01 DIAGNOSIS — T8149XA Infection following a procedure, other surgical site, initial encounter: Secondary | ICD-10-CM

## 2022-01-01 DIAGNOSIS — R2689 Other abnormalities of gait and mobility: Secondary | ICD-10-CM | POA: Diagnosis not present

## 2022-01-01 DIAGNOSIS — S81801A Unspecified open wound, right lower leg, initial encounter: Secondary | ICD-10-CM

## 2022-01-01 NOTE — Therapy (Signed)
[place] ?[place] ?[address] ?[place], [place], [address] ?Phone: [phone]   Fax:  [phone] ? ?Wound Care Therapy ? ?Patient Details  ?Name: Danny [name] ?MRN: [id number] ?Date of Birth: 04/01/1966 ?Referring Provider (PT): Arther Abbott MD ? ? ?Encounter Date: 01/01/2022 ? ? PT End of Session - 01/01/22 1119   ? ? Visit Number 12   ? Number of Visits 16   ? Date for PT Re-Evaluation 01/16/22   ? Authorization Type Medicaid - [employer].  Requested additional visits 01/01/22   ? Authorization Time Period [employer] approved 12 visits from 11/21/2021-02/19/2022   ? Authorization - Visit Number 12   ? Authorization - Number of Visits 12   ? Progress Note Due on Visit 12   ? PT Start Time 1008   ? PT Stop Time 1052   ? PT Time Calculation (min) 44 min   ? Activity Tolerance Patient tolerated treatment well   ? Behavior During Therapy [facility] for tasks assessed/performed   ? ?  ?  ? ?  ? ? ?Past Medical History:  ?Diagnosis Date  ? Alcohol abuse   ? Anxiety   ? Back pain   ? CVA (cerebral infarction)   ? Depression   ? Diabetes mellitus   ? Hypertension   ? Migraines   ? Stroke [facility])   ? ? ?Past Surgical History:  ?Procedure Laterality Date  ? CAST APPLICATION Right 0/16/0109  ? Procedure: CAST APPLICATION;  Surgeon: Carole Civil, MD;  Location: [facility];  Service: Orthopedics;  Laterality: Right;  ? FOOT SURGERY    ? INCISION AND DRAINAGE Right 11/02/2021  ? Procedure: INCISION AND DRAINAGE RIGHT KNEE;  Surgeon: Carole Civil, MD;  Location: [facility];  Service: Orthopedics;  Laterality: Right;  ? IR 3D INDEPENDENT WKST  10/11/2021  ? IR ANGIOGRAM SELECTIVE EACH ADDITIONAL VESSEL  10/11/2021  ? IR ANGIOGRAM SELECTIVE EACH ADDITIONAL VESSEL  10/11/2021  ? IR ANGIOGRAM SELECTIVE EACH ADDITIONAL VESSEL  10/11/2021  ? IR ANGIOGRAM SELECTIVE EACH ADDITIONAL VESSEL  10/11/2021  ? IR ANGIOGRAM SELECTIVE EACH ADDITIONAL VESSEL  10/11/2021  ? IR ANGIOGRAM SELECTIVE EACH ADDITIONAL VESSEL   10/11/2021  ? IR ANGIOGRAM SELECTIVE EACH ADDITIONAL VESSEL  10/25/2021  ? IR ANGIOGRAM SELECTIVE EACH ADDITIONAL VESSEL  10/25/2021  ? IR ANGIOGRAM VISCERAL SELECTIVE  10/11/2021  ? IR EMBO TUMOR ORGAN ISCHEMIA INFARCT INC GUIDE ROADMAPPING  10/25/2021  ? IR EMBO TUMOR ORGAN ISCHEMIA INFARCT INC GUIDE ROADMAPPING  10/25/2021  ? IR RADIOLOGIST EVAL & MGMT  08/23/2021  ? IR RADIOLOGIST EVAL & MGMT  11/21/2021  ? IR US GUIDE VASC ACCESS RIGHT  10/11/2021  ? IR US GUIDE VASC ACCESS RIGHT  10/25/2021  ? MANDIBLE SURGERY    ? QUADRICEPS TENDON REPAIR Right 09/04/2021  ? Procedure: REPAIR QUADRICEP TENDON;  Surgeon: Carole Civil, MD;  Location: [facility];  Service: Orthopedics;  Laterality: Right;  ? TENDON EXPLORATION Right 11/02/2021  ? Procedure: QUADRICEPS TENDON AND PATELLAR TENDON EXPLORATION;  Surgeon: Carole Civil, MD;  Location: [facility];  Service: Orthopedics;  Laterality: Right;  ? ? ?There were no vitals filed for this visit. ? ? ? ? ? ? ? ? ? ? ? ? ? ? Wound Therapy - 01/01/22 0001   ? ? Subjective Pt impressed with wounds progress.   ? Patient and Family Stated Goals wound to heal   ? Date of Onset 09/04/21   ? Prior Treatments dressing changes  at MD office   ? Pain Scale 0-10   ? Pain Score 7    ? Pain Type Surgical pain   ? Pain Location Knee   ? Pain Orientation Right   ? Pain Descriptors / Indicators Sore;Burning   ? Evaluation and Treatment Procedures Explained to Patient/Family Yes   ? Evaluation and Treatment Procedures agreed to   ? Wound Properties Date First Assessed: 12/07/21 Wound Type: Other (Comment) Location: Ankle Location Orientation: Posterior Wound Description (Comments): wound superior posterior ankle Present on Admission: No  ? Dressing Type Bismuth petroleum   ? Dressing Changed Changed   ? Dressing Status Old drainage   ? Dressing Change Frequency PRN   ? Site / Wound Assessment Granulation tissue   ? % Wound base Red or Granulating 100%   ? Peri-wound Assessment Erythema (blanchable)   ?  Wound Length (cm) --   on 4/120 .3 cm was 1.3  ? Wound Width (cm) --   on 12/27/21 .3 was .4  ? Margins Attached edges (approximated)   ? Drainage Amount None   ? Treatment Cleansed   ? Wound Properties Date First Assessed: 12/07/21 Wound Type: Other (Comment) Location: Ankle Location Orientation: Posterior Wound Description (Comments): inferior wound posterior ankle Present on Admission: No  ? Dressing Type Bismuth petroleum   ? Dressing Status Dry   ? Dressing Change Frequency PRN   ? Site / Wound Assessment Granulation tissue;Yellow   ? % Wound base Red or Granulating 100%   ? % Wound base Yellow/Fibrinous Exudate 0%   ? Wound Length (cm) --   on 4/20 .6 was .8  ? Wound Width (cm) --   12/27/21 .4 was .4  ? Drainage Amount None   ? Treatment Cleansed   ? Wound Properties Date First Assessed: 12/04/21 Time First Assessed: 1010 Location: Pretibial Location Orientation: Proximal;Right Present on Admission: -- , unsure   ? Wound Image Images linked: 1   ? Dressing Type Gauze (Comment)   vaseline, 2x2, medihoney, gauze  ? Dressing Changed Changed   ? Dressing Status Old drainage   ? Dressing Change Frequency PRN   ? Site / Wound Assessment Dry;Black;Yellow   ? % Wound base Red or Granulating 10%   ? % Wound base Yellow/Fibrinous Exudate 90%   ? Peri-wound Assessment Erythema (blanchable)   ? Wound Length (cm) --   4/20 1.0 cm was 1.3  ? Wound Width (cm) --   4/20 .5 was .8  ? Margins Unattached edges (unapproximated)   ? Drainage Amount Minimal   ? Drainage Description Sanguineous   ? Treatment Cleansed;Debridement (Selective)   ? Wound Properties Date First Assessed: 12/04/21 Time First Assessed: 1010 Location: Ankle Location Orientation: Right;Lateral Present on Admission: No  ? Wound Image Images linked: 1   ? Dressing Type Gauze (Comment);Compression wrap   vaseline, medihoney on 2x2, gauze  ? Dressing Changed Changed   ? Dressing Status Old drainage   ? Dressing Change Frequency PRN   ? Site / Wound Assessment  Dry;Brown;Yellow   ? % Wound base Red or Granulating 20%   ? % Wound base Yellow/Fibrinous Exudate 80%   ? Peri-wound Assessment Erythema (blanchable)   ? Wound Length (cm) --   4/20 1.0 was 1.4  ? Wound Width (cm) --   4/20 1.0 was 1.2  ? Margins Unattached edges (unapproximated)   ? Drainage Amount Scant   ? Drainage Description Serous   ? Treatment Cleansed;Debridement (Selective)   ?  Wound Properties Date First Assessed: 12/04/21 Location: Toe (Comment  which one) Location Orientation: Anterior;Right Wound Description (Comments): Great Toe Present on Admission: No  ? Wound Image Images linked: 1   ? Dressing Type --   vaseline, medihoney on 2x2, gauze, coban  ? Dressing Status Intact   ? Dressing Change Frequency PRN   ? Site / Wound Assessment Painful;Pink;Yellow   ? % Wound base Red or Granulating 20%   ? % Wound base Yellow/Fibrinous Exudate 80%   ? Peri-wound Assessment Erythema (blanchable)   ? Wound Length (cm) --   12/27/21 .4 cm was 1.0  ? Wound Width (cm) --   4/20 .4 was .6  ? Margins Unattached edges (unapproximated)   ? Drainage Amount Scant   ? Drainage Description Serosanguineous   ? Treatment Cleansed;Debridement (Selective)   ? Wound Properties Date First Assessed: 11/21/21 Time First Assessed: 1130 Wound Type: Incision - Dehisced Location: Knee Location Orientation: Anterior;Right Present on Admission: Yes  ? Wound Image Images linked: 1   ? Dressing Type Bismuth petroleum   vaseline perimeter, xeroform, 4x4, medipore tape  ? Dressing Changed Changed   ? Dressing Status Dry   ? Dressing Change Frequency PRN   ? Site / Wound Assessment Red;Yellow   ? % Wound base Red or Granulating 90%   ? % Wound base Yellow/Fibrinous Exudate 10%   ? Peri-wound Assessment Erythema (blanchable)   ? Wound Length (cm) --   4/20 4.4 was 4.5  ? Wound Width (cm) --   4/20 3.3 was 3.5  ? Margins Epibole (rolled edges)   ? Drainage Amount Moderate   ? Drainage Description Serosanguineous   ? Treatment  Cleansed;Debridement (Selective)   ? Selective Debridement - Location Wound beds and periwounds   ? Selective Debridement - Tools Used Forceps;Scalpel   ? Selective Debridement - Tissue Removed slough   ? Wound Therapy - Clin

## 2022-01-02 ENCOUNTER — Ambulatory Visit: Payer: Medicaid Other | Admitting: Internal Medicine

## 2022-01-02 DIAGNOSIS — R296 Repeated falls: Secondary | ICD-10-CM | POA: Insufficient documentation

## 2022-01-02 DIAGNOSIS — Z7409 Other reduced mobility: Secondary | ICD-10-CM | POA: Insufficient documentation

## 2022-01-03 ENCOUNTER — Ambulatory Visit (HOSPITAL_COMMUNITY): Payer: Medicaid Other | Admitting: Physical Therapy

## 2022-01-03 DIAGNOSIS — R2689 Other abnormalities of gait and mobility: Secondary | ICD-10-CM

## 2022-01-03 DIAGNOSIS — T8149XA Infection following a procedure, other surgical site, initial encounter: Secondary | ICD-10-CM

## 2022-01-03 DIAGNOSIS — S81801A Unspecified open wound, right lower leg, initial encounter: Secondary | ICD-10-CM

## 2022-01-03 NOTE — Addendum Note (Signed)
Addended by: Carole Civil on: 01/03/2022 08:55 AM ? ? Modules accepted: Level of Service ? ?

## 2022-01-03 NOTE — Therapy (Signed)
[place] ?[place] ?[address] ?[place], [place], [address] ?Phone: [phone]   Fax:  [phone] ? ?Wound Care Therapy ? ?Patient Details  ?Name: Danny [name] ?MRN: [id number] ?Date of Birth: 10-04-1965 ?Referring Provider (PT): Arther Abbott MD ? ? ?Encounter Date: 01/03/2022 ? ? PT End of Session - 01/03/22 1114   ? ? Visit Number 13   ? Number of Visits 16   ? Date for PT Re-Evaluation 01/16/22   ? Authorization Type Medicaid - [employer].  Requested additional visits 01/01/22   ? Authorization Time Period [employer] approved 12 visits from 11/21/2021-02/19/2022   ? Authorization - Visit Number 12   ? Authorization - Number of Visits 12   ? Progress Note Due on Visit 12   ? PT Start Time [phone]   ? PT Stop Time [phone]   ? PT Time Calculation (min) 43 min   ? Activity Tolerance Patient tolerated treatment well   ? Behavior During Therapy [facility] for tasks assessed/performed   ? ?  ?  ? ?  ? ? ?Past Medical History:  ?Diagnosis Date  ? Alcohol abuse   ? Anxiety   ? Back pain   ? CVA (cerebral infarction)   ? Depression   ? Diabetes mellitus   ? Hypertension   ? Migraines   ? Stroke [facility])   ? ? ?Past Surgical History:  ?Procedure Laterality Date  ? CAST APPLICATION Right 4/31/5400  ? Procedure: CAST APPLICATION;  Surgeon: Carole Civil, MD;  Location: [facility];  Service: Orthopedics;  Laterality: Right;  ? FOOT SURGERY    ? INCISION AND DRAINAGE Right 11/02/2021  ? Procedure: INCISION AND DRAINAGE RIGHT KNEE;  Surgeon: Carole Civil, MD;  Location: [facility];  Service: Orthopedics;  Laterality: Right;  ? IR 3D INDEPENDENT WKST  10/11/2021  ? IR ANGIOGRAM SELECTIVE EACH ADDITIONAL VESSEL  10/11/2021  ? IR ANGIOGRAM SELECTIVE EACH ADDITIONAL VESSEL  10/11/2021  ? IR ANGIOGRAM SELECTIVE EACH ADDITIONAL VESSEL  10/11/2021  ? IR ANGIOGRAM SELECTIVE EACH ADDITIONAL VESSEL  10/11/2021  ? IR ANGIOGRAM SELECTIVE EACH ADDITIONAL VESSEL  10/11/2021  ? IR ANGIOGRAM SELECTIVE EACH ADDITIONAL VESSEL   10/11/2021  ? IR ANGIOGRAM SELECTIVE EACH ADDITIONAL VESSEL  10/25/2021  ? IR ANGIOGRAM SELECTIVE EACH ADDITIONAL VESSEL  10/25/2021  ? IR ANGIOGRAM VISCERAL SELECTIVE  10/11/2021  ? IR EMBO TUMOR ORGAN ISCHEMIA INFARCT INC GUIDE ROADMAPPING  10/25/2021  ? IR EMBO TUMOR ORGAN ISCHEMIA INFARCT INC GUIDE ROADMAPPING  10/25/2021  ? IR RADIOLOGIST EVAL & MGMT  08/23/2021  ? IR RADIOLOGIST EVAL & MGMT  11/21/2021  ? IR US GUIDE VASC ACCESS RIGHT  10/11/2021  ? IR US GUIDE VASC ACCESS RIGHT  10/25/2021  ? MANDIBLE SURGERY    ? QUADRICEPS TENDON REPAIR Right 09/04/2021  ? Procedure: REPAIR QUADRICEP TENDON;  Surgeon: Carole Civil, MD;  Location: [facility];  Service: Orthopedics;  Laterality: Right;  ? TENDON EXPLORATION Right 11/02/2021  ? Procedure: QUADRICEPS TENDON AND PATELLAR TENDON EXPLORATION;  Surgeon: Carole Civil, MD;  Location: [facility];  Service: Orthopedics;  Laterality: Right;  ? ? ?There were no vitals filed for this visit. ? ? ? ? ? ? ? ? ? ? ? ? ? ? Wound Therapy - 01/03/22 1116   ? ? Subjective wounds continue to heal; no pain reported   ? Patient and Family Stated Goals wound to heal   ? Date of Onset 09/04/21   ? Prior Treatments  dressing changes at MD office   ? Pain Scale 0-10   ? Pain Score 4    ? Pain Type Surgical pain   ? Pain Location Knee   ? Pain Orientation Right   ? Pain Descriptors / Indicators Sore   ? Evaluation and Treatment Procedures Explained to Patient/Family Yes   ? Evaluation and Treatment Procedures agreed to   ? Wound Properties Date First Assessed: 12/04/21 Time First Assessed: 1010 Location: Pretibial Location Orientation: Proximal;Right Present on Admission: -- , unsure   ? Wound Image Images linked: 1   ? Dressing Type Gauze (Comment)   vaseline, 2x2, medihoney, gauze  ? Dressing Changed Changed   ? Dressing Status Old drainage   ? Dressing Change Frequency PRN   ? Site / Wound Assessment Dry;Black;Yellow   ? Peri-wound Assessment Erythema (blanchable)   ? Wound Length (cm) 2 cm    ? Wound Width (cm) 1.2 cm   ? Wound Surface Area (cm^2) 2.4 cm^2   ? Margins Unattached edges (unapproximated)   ? Drainage Amount Minimal   ? Drainage Description Serosanguineous   ? Treatment Cleansed;Debridement (Selective)   ? Wound Properties Date First Assessed: 12/04/21 Time First Assessed: 1010 Location: Ankle Location Orientation: Right;Lateral Present on Admission: No  ? Wound Image Images linked: 1   ? Dressing Type Gauze (Comment);Compression wrap   vaseline, medihoney on 2x2, gauze  ? Dressing Changed Changed   ? Dressing Status Old drainage   ? Dressing Change Frequency PRN   ? Site / Wound Assessment Dry;Brown;Yellow   ? % Wound base Red or Granulating 25%   ? % Wound base Yellow/Fibrinous Exudate 75%   ? Peri-wound Assessment Erythema (blanchable)   ? Wound Length (cm) 1.2 cm   ? Wound Width (cm) 1.2 cm   ? Wound Surface Area (cm^2) 1.44 cm^2   ? Margins Unattached edges (unapproximated)   ? Drainage Amount Scant   ? Drainage Description Serosanguineous   ? Treatment Cleansed;Debridement (Selective)   ? Wound Properties Date First Assessed: 12/04/21 Location: Toe (Comment  which one) Location Orientation: Anterior;Right Wound Description (Comments): Great Toe Present on Admission: No  ? Wound Image Images linked: 1   ? Dressing Type Gauze (Comment)   medihoney  ? Dressing Changed Changed   ? Dressing Status Intact   ? Dressing Change Frequency PRN   ? Site / Wound Assessment Pink;Yellow   ? % Wound base Red or Granulating 50%   ? % Wound base Yellow/Fibrinous Exudate 50%   ? Peri-wound Assessment Erythema (blanchable)   ? Wound Length (cm) 0.4 cm   ? Wound Width (cm) 0.3 cm   ? Wound Depth (cm) 0.2 cm   ? Wound Volume (cm^3) 0.02 cm^3   ? Wound Surface Area (cm^2) 0.12 cm^2   ? Margins Unattached edges (unapproximated)   ? Drainage Amount Scant   ? Drainage Description Serosanguineous   ? Treatment Cleansed;Debridement (Selective)   ? Wound Properties Date First Assessed: 11/21/21 Time First  Assessed: 1130 Wound Type: Incision - Dehisced Location: Knee Location Orientation: Anterior;Right Present on Admission: Yes  ? Wound Image Images linked: 1   ? Dressing Type Bismuth petroleum   vaseline perimeter, xeroform, 4x4, medipore tape  ? Dressing Changed Changed   ? Dressing Status Dry   ? Dressing Change Frequency PRN   ? Site / Wound Assessment Red;Yellow   ? % Wound base Red or Granulating 90%   ? % Wound base Yellow/Fibrinous Exudate 10%   ?  Peri-wound Assessment Erythema (blanchable)   ? Wound Length (cm) 3.6 cm   ? Wound Width (cm) 2.6 cm   ? Wound Surface Area (cm^2) 9.36 cm^2   ? Margins Epibole (rolled edges)   ? Drainage Amount Minimal   ? Drainage Description Serosanguineous   ? Treatment Cleansed;Debridement (Selective)   ? Selective Debridement - Location Wound beds and periwounds   ? Selective Debridement - Tools Used Forceps;Scalpel   ? Selective Debridement - Tissue Removed slough   ? Wound Therapy - Clinical Statement All wounds photographed and measured today.  4 wounds remain.  Both posterior ankle wounds are now healed.   Shin and lateral ankle wound are larger as compared to last measurements, however more devitalized tissue was debrided from periemter to encourage approximation. Knee is approximating quickly at this point and responding well to xerform.  continued with medihoney to all other wounds and secured with medipore, kerlix and netting.   ? Wound Therapy - Functional Problem List gait, bathing, dressing   ? Factors Delaying/Impairing Wound Healing Diabetes Mellitus;Immobility;Multiple medical problems;Tobacco use   ? Hydrotherapy Plan Debridement;Dressing change;Electrical stimulation;Patient/family education;Pulsatile lavage with suction   ? Wound Therapy - Frequency 2X / week   ? Wound Therapy - Current Recommendations PT   ? Wound Plan debride and dressing changes.  measure and photograph weekly.   ? Dressing  Rt knee: xeroform, 4X4, medipore tape   ? Dressing Rt shin,  lateral ankle, toe medihoney, 2x2,medipore, kerlix, netting   ? ?  ?  ? ?  ? ? ? ? ? ? ? ? ? ? ? ? PT Short Term Goals - 12/25/21 1812   ? ?  ? PT SHORT TERM GOAL #1  ? Title Patient wound will be free from slough

## 2022-01-07 DIAGNOSIS — S81001A Unspecified open wound, right knee, initial encounter: Secondary | ICD-10-CM | POA: Insufficient documentation

## 2022-01-08 ENCOUNTER — Encounter (HOSPITAL_COMMUNITY): Payer: Self-pay | Admitting: Physical Therapy

## 2022-01-08 ENCOUNTER — Telehealth: Payer: Self-pay | Admitting: Orthopedic Surgery

## 2022-01-08 ENCOUNTER — Other Ambulatory Visit: Payer: Self-pay

## 2022-01-08 ENCOUNTER — Ambulatory Visit (HOSPITAL_COMMUNITY): Payer: Medicaid Other | Attending: Orthopedic Surgery | Admitting: Physical Therapy

## 2022-01-08 ENCOUNTER — Ambulatory Visit (HOSPITAL_COMMUNITY): Payer: Medicaid Other | Admitting: Physical Therapy

## 2022-01-08 DIAGNOSIS — R2689 Other abnormalities of gait and mobility: Secondary | ICD-10-CM | POA: Insufficient documentation

## 2022-01-08 DIAGNOSIS — M6281 Muscle weakness (generalized): Secondary | ICD-10-CM | POA: Insufficient documentation

## 2022-01-08 DIAGNOSIS — T8189XD Other complications of procedures, not elsewhere classified, subsequent encounter: Secondary | ICD-10-CM

## 2022-01-08 DIAGNOSIS — T8149XA Infection following a procedure, other surgical site, initial encounter: Secondary | ICD-10-CM | POA: Diagnosis present

## 2022-01-08 DIAGNOSIS — S81801A Unspecified open wound, right lower leg, initial encounter: Secondary | ICD-10-CM | POA: Insufficient documentation

## 2022-01-08 MED ORDER — OXYCODONE-ACETAMINOPHEN 10-325 MG PO TABS: 1.0000 | ORAL_TABLET | ORAL | 0 refills | Status: DC | PRN

## 2022-01-08 NOTE — Telephone Encounter (Signed)
Gay Filler called, LMVM, said to please call her when Rx is ready to be picked up.   ?

## 2022-01-08 NOTE — Therapy (Signed)
[facility] ?[address] ?[place], [place], [address] ?Phone: [phone]   Fax:  [phone] ? ?Wound Care Therapy ? ?Patient Details  ?Name: Danny [name] ?MRN: [id number] ?Date of Birth: 1966-04-10 ?Referring Provider (PT): Arther Abbott MD ? ? ?Encounter Date: 01/08/2022 ? ? PT End of Session - 01/08/22 1051   ? ? Visit Number 14   ? Number of Visits 16   ? Date for PT Re-Evaluation 01/16/22   ? Authorization Type Medicaid - [employer].  Requested additional visits 01/01/22   ? Authorization Time Period [employer] approved 12 visits from 11/21/2021-02/19/2022   ? Authorization - Visit Number 12   ? Authorization - Number of Visits 12   ? Progress Note Due on Visit 12   ? PT Start Time 1051   ? PT Stop Time 2542   ? PT Time Calculation (min) 41 min   ? Activity Tolerance Patient tolerated treatment well   ? Behavior During Therapy [facility] for tasks assessed/performed   ? ?  ?  ? ?  ? ? ?Past Medical History:  ?Diagnosis Date  ? Alcohol abuse   ? Anxiety   ? Back pain   ? CVA (cerebral infarction)   ? Depression   ? Diabetes mellitus   ? Hypertension   ? Migraines   ? Stroke [facility])   ? ? ?Past Surgical History:  ?Procedure Laterality Date  ? CAST APPLICATION Right 03/15/2375  ? Procedure: CAST APPLICATION;  Surgeon: Carole Civil, MD;  Location: [facility];  Service: Orthopedics;  Laterality: Right;  ? FOOT SURGERY    ? INCISION AND DRAINAGE Right 11/02/2021  ? Procedure: INCISION AND DRAINAGE RIGHT KNEE;  Surgeon: Carole Civil, MD;  Location: [facility];  Service: Orthopedics;  Laterality: Right;  ? IR 3D INDEPENDENT WKST  10/11/2021  ? IR ANGIOGRAM SELECTIVE EACH ADDITIONAL VESSEL  10/11/2021  ? IR ANGIOGRAM SELECTIVE EACH ADDITIONAL VESSEL  10/11/2021  ? IR ANGIOGRAM SELECTIVE EACH ADDITIONAL VESSEL  10/11/2021  ? IR ANGIOGRAM SELECTIVE EACH ADDITIONAL VESSEL  10/11/2021  ? IR ANGIOGRAM SELECTIVE EACH ADDITIONAL VESSEL  10/11/2021  ? IR ANGIOGRAM SELECTIVE EACH ADDITIONAL VESSEL   10/11/2021  ? IR ANGIOGRAM SELECTIVE EACH ADDITIONAL VESSEL  10/25/2021  ? IR ANGIOGRAM SELECTIVE EACH ADDITIONAL VESSEL  10/25/2021  ? IR ANGIOGRAM VISCERAL SELECTIVE  10/11/2021  ? IR EMBO TUMOR ORGAN ISCHEMIA INFARCT INC GUIDE ROADMAPPING  10/25/2021  ? IR EMBO TUMOR ORGAN ISCHEMIA INFARCT INC GUIDE ROADMAPPING  10/25/2021  ? IR RADIOLOGIST EVAL & MGMT  08/23/2021  ? IR RADIOLOGIST EVAL & MGMT  11/21/2021  ? IR US GUIDE VASC ACCESS RIGHT  10/11/2021  ? IR US GUIDE VASC ACCESS RIGHT  10/25/2021  ? MANDIBLE SURGERY    ? QUADRICEPS TENDON REPAIR Right 09/04/2021  ? Procedure: REPAIR QUADRICEP TENDON;  Surgeon: Carole Civil, MD;  Location: [facility];  Service: Orthopedics;  Laterality: Right;  ? TENDON EXPLORATION Right 11/02/2021  ? Procedure: QUADRICEPS TENDON AND PATELLAR TENDON EXPLORATION;  Surgeon: Carole Civil, MD;  Location: [facility];  Service: Orthopedics;  Laterality: Right;  ? ? ?There were no vitals filed for this visit. ? ? ? ? ? ? ? ? ? ? ? ? ? ? Wound Therapy - 01/08/22 0001   ? ? Subjective Leg hurts today   ? Patient and Family Stated Goals wound to heal   ? Date of Onset 09/04/21   ? Prior Treatments dressing changes at MD  office   ? Pain Score 7    ? Pain Type Surgical pain   ? Pain Location Knee   ? Pain Orientation Right   ? Pain Descriptors / Indicators Sore   ? Evaluation and Treatment Procedures Explained to Patient/Family Yes   ? Evaluation and Treatment Procedures agreed to   ? Wound Properties Date First Assessed: 12/04/21 Time First Assessed: 1010 Location: Pretibial Location Orientation: Proximal;Right Present on Admission: -- , unsure   ? Dressing Type Gauze (Comment)   vaseline, 2x2, medihoney, gauze  ? Dressing Status Old drainage   ? Dressing Change Frequency PRN   ? Site / Wound Assessment Dry;Yellow   ? % Wound base Red or Granulating 10%   ? % Wound base Yellow/Fibrinous Exudate 90%   ? Peri-wound Assessment Erythema (blanchable)   ? Margins Unattached edges (unapproximated)   ?  Drainage Amount None   ? Treatment Cleansed;Debridement (Selective)   ? Wound Properties Date First Assessed: 12/04/21 Time First Assessed: 1010 Location: Ankle Location Orientation: Right;Lateral Present on Admission: No  ? Dressing Type Gauze (Comment);Compression wrap   vaseline, medihoney on 2x2, gauze  ? Dressing Changed Changed   ? Dressing Status Old drainage   ? Dressing Change Frequency PRN   ? Site / Wound Assessment Dry;Brown;Yellow   ? % Wound base Red or Granulating 25%   ? % Wound base Yellow/Fibrinous Exudate 75%   ? Peri-wound Assessment Erythema (blanchable)   ? Margins Unattached edges (unapproximated)   ? Drainage Amount Minimal   ? Drainage Description Serosanguineous   ? Treatment Cleansed;Debridement (Selective)   ? Wound Properties Date First Assessed: 12/04/21 Location: Toe (Comment  which one) Location Orientation: Anterior;Right Wound Description (Comments): Great Toe Present on Admission: No  ? Dressing Type Gauze (Comment)   medihoney  ? Dressing Changed Changed   ? Dressing Status Intact   ? Dressing Change Frequency PRN   ? Site / Wound Assessment Pink;Yellow   ? % Wound base Red or Granulating 10%   ? % Wound base Yellow/Fibrinous Exudate 90%   ? Peri-wound Assessment Erythema (blanchable)   ? Margins Unattached edges (unapproximated)   ? Drainage Amount Minimal   ? Drainage Description Serosanguineous   ? Treatment Cleansed;Debridement (Selective)   ? Wound Properties Date First Assessed: 11/21/21 Time First Assessed: 1130 Wound Type: Incision - Dehisced Location: Knee Location Orientation: Anterior;Right Present on Admission: Yes  ? Dressing Type Bismuth petroleum   vaseline perimeter, xeroform, 4x4, medipore tape  ? Dressing Status Dry   ? Dressing Change Frequency PRN   ? Site / Wound Assessment Red;Yellow   ? % Wound base Red or Granulating 95%   ? % Wound base Yellow/Fibrinous Exudate 5%   ? Peri-wound Assessment Erythema (blanchable)   ? Margins Epibole (rolled edges)   ?  Drainage Amount Minimal   ? Drainage Description Serosanguineous   ? Treatment Cleansed;Debridement (Selective)   ? Selective Debridement - Location Wound beds and periwounds   ? Selective Debridement - Tools Used Forceps;Scalpel   ? Selective Debridement - Tissue Removed slough   ? Wound Therapy - Clinical Statement Patient with increased slough and redness around R great toe wound. Very adherent slough at R lateral ankle and R shin and minimal slough able to be removed. Tolerates debridment of knee wound and able to remove most slough. continued with same dressings.   ? Wound Therapy - Functional Problem List gait, bathing, dressing   ? Factors Delaying/Impairing Wound Healing Diabetes Mellitus;Immobility;Multiple  medical problems;Tobacco use   ? Hydrotherapy Plan Debridement;Dressing change;Electrical stimulation;Patient/family education;Pulsatile lavage with suction   ? Wound Therapy - Frequency 2X / week   ? Wound Therapy - Current Recommendations PT   ? Wound Plan debride and dressing changes.  measure and photograph weekly.   ? Dressing  Rt knee: xeroform, 4X4, medipore tape   ? Dressing lateral ankle, toe medihoney, 2x2,medipore, kerlix, netting; R shin medihoney 2x2, hydrogel, 2x2 medipore   ? ?  ?  ? ?  ? ? ? ? ? ? ? ? ? ? ? ? PT Short Term Goals - 12/25/21 1812   ? ?  ? PT SHORT TERM GOAL #1  ? Title Patient wound will be free from slough to demonstrate wound healing.   ? Status On-going   ? ?  ?  ? ?  ? ? ? ? PT Long Term Goals - 12/25/21 1812   ? ?  ? PT LONG TERM GOAL #1  ? Title Patient wound to be healed to reduce risk of infection.   ? Status Not Met   ?  ? PT LONG TERM GOAL #2  ? Title Paitent will be able to transition to formal PT for knee rehab to improve functional mobility and strength.   ? Status Not Met   ? ?  ?  ? ?  ? ? ? ? ? ? ? ? Plan - 01/08/22 1133   ? ? Clinical Impression Statement see above   ? Personal Factors and Comorbidities Comorbidity 3+;Fitness;Past/Current Experience;Time  since onset of injury/illness/exacerbation   ? Comorbidities liver ca, DM, Smokes, hx CVA, HTN   ? Examination-Activity Limitations Bathing;Locomotion Level;Transfers;Lift;Stand;Stairs;Squat   ? Examination-Participat

## 2022-01-08 NOTE — Telephone Encounter (Signed)
Patient niece Danny [name] called and request refills on [name] pain medicine. ?  ?Does he need to continue with his antibiotics?  He is out of them as well.    ?  ?oxyCODONE-acetaminophen (PERCOCET) 10-325 MG tablet ?  ?Pharmacy:  [facility] in [place]  ?  ?  ?  ?Questions call Danny [name] at [phone] ?

## 2022-01-08 NOTE — Telephone Encounter (Signed)
Sent to provider 

## 2022-01-08 NOTE — Telephone Encounter (Signed)
Patient niece Mateo Flow called and request refills on [place] pain medicine. ? ?Does he need to continue with his antibiotics?  He is out of them as well.    ? ?oxyCODONE-acetaminophen (PERCOCET) 10-325 MG tablet ? ?Pharmacy:  [facility] in [place]  ? ? ? ?Questions call Mateo Flow at [phone] ?

## 2022-01-08 NOTE — Telephone Encounter (Signed)
Request sent to provider.

## 2022-01-10 ENCOUNTER — Ambulatory Visit (HOSPITAL_COMMUNITY): Payer: Medicaid Other | Admitting: Physical Therapy

## 2022-01-10 DIAGNOSIS — T8149XA Infection following a procedure, other surgical site, initial encounter: Secondary | ICD-10-CM | POA: Diagnosis not present

## 2022-01-10 DIAGNOSIS — S81801A Unspecified open wound, right lower leg, initial encounter: Secondary | ICD-10-CM

## 2022-01-10 DIAGNOSIS — R2689 Other abnormalities of gait and mobility: Secondary | ICD-10-CM

## 2022-01-10 NOTE — Therapy (Addendum)
[facility] ?[address] ?[place], [place], [address] ?Phone: [phone]   Fax:  [phone] ? ?Wound Care Therapy ? ?Patient Details  ?Name: Danny [name] ?MRN: [id number] ?Date of Birth: 05/20/1966 ?Referring Provider (PT): Arther Abbott MD ? ? ?Encounter Date: 01/10/2022 ? ? PT End of Session - 01/10/22 1718   ? ? Visit Number 15   ? Number of Visits 16   ? Date for PT Re-Evaluation 01/16/22   ? Authorization Type Medicaid - [employer].  Requested additional visits 01/01/22   ? Authorization Time Period [employer] approved 12 visits from 11/21/2021-03/21/2022 then 12 more visits from 4/21 thru 7/13.  ? Authorization - Visit Number 02   ? Authorization - Number of Visits 12   ? Progress Note Due on Visit 12   ? Activity Tolerance Patient tolerated treatment well   ? Behavior During Therapy [facility] for tasks assessed/performed   ? ?  ?  ? ?  ? ? ?Past Medical History:  ?Diagnosis Date  ? Alcohol abuse   ? Anxiety   ? Back pain   ? CVA (cerebral infarction)   ? Depression   ? Diabetes mellitus   ? Hypertension   ? Migraines   ? Stroke [facility])   ? ? ?Past Surgical History:  ?Procedure Laterality Date  ? CAST APPLICATION Right 6/96/2952  ? Procedure: CAST APPLICATION;  Surgeon: Carole Civil, MD;  Location: [facility];  Service: Orthopedics;  Laterality: Right;  ? FOOT SURGERY    ? INCISION AND DRAINAGE Right 11/02/2021  ? Procedure: INCISION AND DRAINAGE RIGHT KNEE;  Surgeon: Carole Civil, MD;  Location: [facility];  Service: Orthopedics;  Laterality: Right;  ? IR 3D INDEPENDENT WKST  10/11/2021  ? IR ANGIOGRAM SELECTIVE EACH ADDITIONAL VESSEL  10/11/2021  ? IR ANGIOGRAM SELECTIVE EACH ADDITIONAL VESSEL  10/11/2021  ? IR ANGIOGRAM SELECTIVE EACH ADDITIONAL VESSEL  10/11/2021  ? IR ANGIOGRAM SELECTIVE EACH ADDITIONAL VESSEL  10/11/2021  ? IR ANGIOGRAM SELECTIVE EACH ADDITIONAL VESSEL  10/11/2021  ? IR ANGIOGRAM SELECTIVE EACH ADDITIONAL VESSEL  10/11/2021  ? IR ANGIOGRAM SELECTIVE EACH  ADDITIONAL VESSEL  10/25/2021  ? IR ANGIOGRAM SELECTIVE EACH ADDITIONAL VESSEL  10/25/2021  ? IR ANGIOGRAM VISCERAL SELECTIVE  10/11/2021  ? IR EMBO TUMOR ORGAN ISCHEMIA INFARCT INC GUIDE ROADMAPPING  10/25/2021  ? IR EMBO TUMOR ORGAN ISCHEMIA INFARCT INC GUIDE ROADMAPPING  10/25/2021  ? IR RADIOLOGIST EVAL & MGMT  08/23/2021  ? IR RADIOLOGIST EVAL & MGMT  11/21/2021  ? IR US GUIDE VASC ACCESS RIGHT  10/11/2021  ? IR US GUIDE VASC ACCESS RIGHT  10/25/2021  ? MANDIBLE SURGERY    ? QUADRICEPS TENDON REPAIR Right 09/04/2021  ? Procedure: REPAIR QUADRICEP TENDON;  Surgeon: Carole Civil, MD;  Location: [facility];  Service: Orthopedics;  Laterality: Right;  ? TENDON EXPLORATION Right 11/02/2021  ? Procedure: QUADRICEPS TENDON AND PATELLAR TENDON EXPLORATION;  Surgeon: Carole Civil, MD;  Location: [facility];  Service: Orthopedics;  Laterality: Right;  ? ? ?There were no vitals filed for this visit. ? ? ? ? ? ? ? ? ? ? ? ? ? ? Wound Therapy - 01/10/22 0001   ? ? Subjective Pt states that he is feeling pretty good right now   ? Patient and Family Stated Goals wound to heal   ? Date of Onset 09/04/21   ? Prior Treatments dressing changes at MD office   ? Pain Scale 0-10   ?  Pain Score 0-No pain   ? Evaluation and Treatment Procedures Explained to Patient/Family Yes   ? Evaluation and Treatment Procedures agreed to   ? Wound Properties Date First Assessed: 12/04/21 Time First Assessed: 1010 Location: Pretibial Location Orientation: Proximal;Right Present on Admission: -- , unsure   ? Dressing Type Gauze (Comment)   vaseline, 2x2, medihoney, gauze  ? Dressing Status Old drainage   ? Dressing Change Frequency PRN   ? Site / Wound Assessment Dry;Yellow   ? % Wound base Red or Granulating 10%   ? % Wound base Yellow/Fibrinous Exudate 90%   ? Peri-wound Assessment Erythema (blanchable)   ? Wound Length (cm) 2 cm   ? Wound Width (cm) 1.8 cm   ? Wound Depth (cm) 0.2 cm   ? Wound Volume (cm^3) 0.72 cm^3   ? Wound Surface Area (cm^2) 3.6  cm^2   ? Margins Unattached edges (unapproximated)   ? Drainage Amount Minimal   ? Drainage Description Serosanguineous   ? Treatment Cleansed;Debridement (Selective)   ? Wound Properties Date First Assessed: 12/04/21 Time First Assessed: 1010 Location: Ankle Location Orientation: Right;Lateral Present on Admission: No  ? Dressing Type Gauze (Comment);Compression wrap   vaseline, medihoney on 2x2, gauze  ? Dressing Status Old drainage   ? Dressing Change Frequency PRN   ? Site / Wound Assessment Dry;Brown;Yellow   ? % Wound base Red or Granulating 25%   ? % Wound base Yellow/Fibrinous Exudate 75%   ? Peri-wound Assessment Erythema (blanchable)   ? Wound Length (cm) 2 cm   ? Wound Width (cm) 1.8 cm   ? Wound Surface Area (cm^2) 3.6 cm^2   ? Margins Unattached edges (unapproximated)   ? Drainage Amount Minimal   ? Drainage Description Serosanguineous   ? Treatment Cleansed;Debridement (Selective)   ? Wound Properties Date First Assessed: 12/04/21 Location: Toe (Comment  which one) Location Orientation: Anterior;Right Wound Description (Comments): Great Toe Present on Admission: No  ? Dressing Type Gauze (Comment)   medihoney  ? Dressing Status Intact   ? Dressing Change Frequency PRN   ? Site / Wound Assessment Pink;Yellow   ? % Wound base Red or Granulating 10%   ? % Wound base Yellow/Fibrinous Exudate 90%   ? Peri-wound Assessment Erythema (blanchable)   ? Wound Length (cm) 0.5 cm   ? Wound Width (cm) 0.8 cm   ? Wound Surface Area (cm^2) 0.4 cm^2   ? Margins Unattached edges (unapproximated)   ? Drainage Amount Minimal   ? Drainage Description Serosanguineous   ? Treatment Cleansed;Debridement (Selective)   ? Wound Properties Date First Assessed: 11/21/21 Time First Assessed: 1130 Wound Type: Incision - Dehisced Location: Knee Location Orientation: Anterior;Right Present on Admission: Yes  ? Dressing Type Bismuth petroleum   vaseline perimeter, xeroform, 4x4, medipore tape  ? Dressing Status Dry   ? Dressing Change  Frequency PRN   ? Site / Wound Assessment Red;Yellow   ? % Wound base Red or Granulating 100%   ? % Wound base Yellow/Fibrinous Exudate 0%   ? Peri-wound Assessment Erythema (blanchable)   ? Wound Length (cm) 3.5 cm   ? Wound Width (cm) 2.5 cm   ? Wound Surface Area (cm^2) 8.75 cm^2   ? Margins Epibole (rolled edges)   ? Drainage Amount Minimal   ? Drainage Description Serosanguineous   ? Selective Debridement - Location Wound beds and periwounds   ? Selective Debridement - Tools Used Forceps;Scalpel   ? Selective Debridement - Tissue Removed  slough   ? Wound Therapy - Clinical Statement PT original knee wound continues to approximate.  THere is hyper granulation therefore therapist used foam prior to taping gauze down.  Other wounds continue to have adherent eschar with therapis blading all areas.   ? Wound Therapy - Functional Problem List gait, bathing, dressing   ? Factors Delaying/Impairing Wound Healing Diabetes Mellitus;Immobility;Multiple medical problems;Tobacco use   ? Hydrotherapy Plan Debridement;Dressing change;Electrical stimulation;Patient/family education;Pulsatile lavage with suction   ? Wound Therapy - Frequency 2X / week   ? Wound Therapy - Current Recommendations PT   ? Wound Plan debride and dressing changes.  measure and photograph weekly.   ? Dressing  Rt knee: xeroform, 4X4, medipore tape   ? Dressing lateral ankle, toe medihoney, 2x2,medipore, kerlix, netting; R shin medihoney 2x2, hydrogel, 2x2 medipore   ? ?  ?  ? ?  ? ? ? ? ? ? ? ? ? ? ? ? PT Short Term Goals - 01/10/22 1725   ? ?  ? PT SHORT TERM GOAL #1  ? Title Patient wound will be free from slough to demonstrate wound healing.   ? Period Weeks   ? Status On-going   ? ?  ?  ? ?  ? ? ? ? PT Long Term Goals - 01/10/22 1725   ? ?  ? PT LONG TERM GOAL #1  ? Title Patient wound to be healed to reduce risk of infection.   ? Status On-going   ?  ? PT LONG TERM GOAL #2  ? Title Paitent will be able to transition to formal PT for knee rehab to  improve functional mobility and strength.   ? Status On-going   ? ?  ?  ? ?  ? ? ? ? ? ? ? ? Plan - 01/10/22 1725   ? ? Clinical Impression Statement see above   ? Personal Factors and Comorbidities Comor

## 2022-01-14 ENCOUNTER — Ambulatory Visit (INDEPENDENT_AMBULATORY_CARE_PROVIDER_SITE_OTHER): Payer: Medicaid Other | Admitting: Orthopedic Surgery

## 2022-01-14 ENCOUNTER — Ambulatory Visit (HOSPITAL_COMMUNITY): Payer: Medicaid Other | Admitting: Physical Therapy

## 2022-01-14 DIAGNOSIS — T8149XA Infection following a procedure, other surgical site, initial encounter: Secondary | ICD-10-CM

## 2022-01-14 DIAGNOSIS — T8189XD Other complications of procedures, not elsewhere classified, subsequent encounter: Secondary | ICD-10-CM

## 2022-01-14 DIAGNOSIS — S81801A Unspecified open wound, right lower leg, initial encounter: Secondary | ICD-10-CM

## 2022-01-14 DIAGNOSIS — R2689 Other abnormalities of gait and mobility: Secondary | ICD-10-CM

## 2022-01-14 MED ORDER — OXYCODONE-ACETAMINOPHEN 10-325 MG PO TABS: 1.0000 | ORAL_TABLET | ORAL | 0 refills | Status: DC | PRN

## 2022-01-14 MED ORDER — SULFAMETHOXAZOLE-TRIMETHOPRIM 800-160 MG PO TABS: 1.0000 | ORAL_TABLET | Freq: Two times a day (BID) | ORAL | 0 refills | Status: DC

## 2022-01-14 NOTE — Therapy (Signed)
[facility] ?[address] ?[place], [place], [address] ?Phone: [phone]   Fax:  [phone] ? ?Wound Care Therapy ? ?Patient Details  ?Name: Danny [name] ?MRN: [id number] ?Date of Birth: August 08, 1966 ?Referring Provider (PT): Arther Abbott MD ? ? ?Encounter Date: 01/14/2022 ? ? PT End of Session - 01/14/22 1447   ? ? Visit Number 16   ? Number of Visits 24   ? Date for PT Re-Evaluation 02/13/22   ? Authorization - Visit Number 3   ? Authorization - Number of Visits 12   ? ?  ?  ? ?  ? ? ?Past Medical History:  ?Diagnosis Date  ? Alcohol abuse   ? Anxiety   ? Back pain   ? CVA (cerebral infarction)   ? Depression   ? Diabetes mellitus   ? Hypertension   ? Migraines   ? Stroke [facility])   ? ? ?Past Surgical History:  ?Procedure Laterality Date  ? CAST APPLICATION Right 5/70/1779  ? Procedure: CAST APPLICATION;  Surgeon: Carole Civil, MD;  Location: [facility];  Service: Orthopedics;  Laterality: Right;  ? FOOT SURGERY    ? INCISION AND DRAINAGE Right 11/02/2021  ? Procedure: INCISION AND DRAINAGE RIGHT KNEE;  Surgeon: Carole Civil, MD;  Location: [facility];  Service: Orthopedics;  Laterality: Right;  ? IR 3D INDEPENDENT WKST  10/11/2021  ? IR ANGIOGRAM SELECTIVE EACH ADDITIONAL VESSEL  10/11/2021  ? IR ANGIOGRAM SELECTIVE EACH ADDITIONAL VESSEL  10/11/2021  ? IR ANGIOGRAM SELECTIVE EACH ADDITIONAL VESSEL  10/11/2021  ? IR ANGIOGRAM SELECTIVE EACH ADDITIONAL VESSEL  10/11/2021  ? IR ANGIOGRAM SELECTIVE EACH ADDITIONAL VESSEL  10/11/2021  ? IR ANGIOGRAM SELECTIVE EACH ADDITIONAL VESSEL  10/11/2021  ? IR ANGIOGRAM SELECTIVE EACH ADDITIONAL VESSEL  10/25/2021  ? IR ANGIOGRAM SELECTIVE EACH ADDITIONAL VESSEL  10/25/2021  ? IR ANGIOGRAM VISCERAL SELECTIVE  10/11/2021  ? IR EMBO TUMOR ORGAN ISCHEMIA INFARCT INC GUIDE ROADMAPPING  10/25/2021  ? IR EMBO TUMOR ORGAN ISCHEMIA INFARCT INC GUIDE ROADMAPPING  10/25/2021  ? IR RADIOLOGIST EVAL & MGMT  08/23/2021  ? IR RADIOLOGIST EVAL & MGMT  11/21/2021   ? IR US GUIDE VASC ACCESS RIGHT  10/11/2021  ? IR US GUIDE VASC ACCESS RIGHT  10/25/2021  ? MANDIBLE SURGERY    ? QUADRICEPS TENDON REPAIR Right 09/04/2021  ? Procedure: REPAIR QUADRICEP TENDON;  Surgeon: Carole Civil, MD;  Location: [facility];  Service: Orthopedics;  Laterality: Right;  ? TENDON EXPLORATION Right 11/02/2021  ? Procedure: QUADRICEPS TENDON AND PATELLAR TENDON EXPLORATION;  Surgeon: Carole Civil, MD;  Location: [facility];  Service: Orthopedics;  Laterality: Right;  ? ? ?There were no vitals filed for this visit. ? ? ? ? ? ? ? ? ? ? ? ? ? ? Wound Therapy - 01/14/22 0001   ? ? Subjective Pt states that he is feeling pretty good right now   ? Patient and Family Stated Goals wound to heal   ? Date of Onset 09/04/21   ? Prior Treatments dressing changes at MD office   ? Evaluation and Treatment Procedures Explained to Patient/Family Yes   ? Evaluation and Treatment Procedures agreed to   ? Wound Properties Date First Assessed: 12/04/21 Time First Assessed: 1010 Location: Pretibial Location Orientation: Proximal;Right Present on Admission: -- , unsure   ? Dressing Type Gauze (Comment)   vaseline, 2x2, medihoney, gauze  ? Dressing Status Old drainage   ? Dressing Change  Frequency PRN   ? Site / Wound Assessment Dry;Yellow   ? Peri-wound Assessment Erythema (blanchable)   ? Wound Length (cm) 2 cm   ? Wound Width (cm) 1.6 cm   ? Wound Surface Area (cm^2) 3.2 cm^2   ? Margins Unattached edges (unapproximated)   ? Drainage Amount Minimal   ? Drainage Description Serous   ? Treatment Cleansed;Debridement (Selective)   ? Wound Properties Date First Assessed: 12/04/21 Time First Assessed: 1010 Location: Ankle Location Orientation: Right;Lateral Present on Admission: No  ? Dressing Type Gauze (Comment);Compression wrap   vaseline, medihoney on 2x2, gauze  ? Dressing Status Old drainage   ? Dressing Change Frequency PRN   ? Site / Wound Assessment Dry;Brown;Yellow   ? % Wound base Red or Granulating 25%   was  10  ? % Wound base Yellow/Fibrinous Exudate 75%   was 90  ? Peri-wound Assessment Erythema (blanchable)   ? Wound Length (cm) 0.9 cm   ? Wound Width (cm) 0.8 cm   ? Wound Surface Area (cm^2) 0.72 cm^2   ? Margins Unattached edges (unapproximated)   ? Drainage Amount Minimal   ? Drainage Description Serous   ? Treatment Cleansed;Debridement (Selective)   ? Wound Properties Date First Assessed: 12/04/21 Location: Toe (Comment  which one) Location Orientation: Anterior;Right Wound Description (Comments): Great Toe Present on Admission: No  ? Dressing Type Gauze (Comment)   medihoney  ? Dressing Status Intact   ? Dressing Change Frequency PRN   ? Site / Wound Assessment Pink;Yellow   ? % Wound base Red or Granulating 15%   was 0  ? % Wound base Yellow/Fibrinous Exudate 85%   was 70  ? % Wound base Black/Eschar --   was 30  ? Peri-wound Assessment Erythema (blanchable)   ? Wound Length (cm) 0.5 cm   was 1.0  ? Wound Width (cm) 0.6 cm   was .6  ? Wound Surface Area (cm^2) 0.3 cm^2   ? Margins Unattached edges (unapproximated)   ? Drainage Amount Scant   ? Drainage Description Serous   ? Treatment Cleansed;Debridement (Selective)   ? Wound Properties Date First Assessed: 11/21/21 Time First Assessed: 1130 Wound Type: Incision - Dehisced Location: Knee Location Orientation: Anterior;Right Present on Admission: Yes  ? Dressing Type Bismuth petroleum   vaseline perimeter, xeroform, 4x4, medipore tape  ? Dressing Status Dry   ? Dressing Change Frequency PRN   ? Site / Wound Assessment Red;Yellow   ? % Wound base Red or Granulating 100%   initial 15% granulation  ? Peri-wound Assessment Erythema (blanchable)   ? Wound Length (cm) 3.4 cm   initial 4.9  ? Wound Width (cm) 2.4 cm   inital 3.0  ? Wound Surface Area (cm^2) 8.16 cm^2   ? Margins Epibole (rolled edges)   ? Drainage Amount Minimal   ? Drainage Description Serosanguineous   ? Selective Debridement - Location Wound beds and periwounds   ? Selective Debridement - Tools  Used Forceps;Scalpel   ? Selective Debridement - Tissue Removed slough   ? Wound Therapy - Clinical Statement PT gait trained with cane with good form.  Encouraged to obtain a cane for use at home.  Pt original knee wound continues to approximate.  THere continues to be hyper granulation therefore therapist used foam prior to taping gauze down.  Other wounds continue to have adherent eschar  although it does appear to be lossening.  Therapist continued to blad all areas to decrease eschar.  PT will continue to benefit from skilled PT for wound care as well as to begin gait training and exercises.   ? Wound Therapy - Functional Problem List gait, bathing, dressing   ? Factors Delaying/Impairing Wound Healing Diabetes Mellitus;Immobility;Multiple medical problems;Tobacco use   ? Hydrotherapy Plan Debridement;Dressing change;Electrical stimulation;Patient/family education;Pulsatile lavage with suction   ? Wound Therapy - Frequency 2X / week   ? Wound Therapy - Current Recommendations PT   ? Wound Plan Give PT HEP to inclde LAQ< SAQ, Quad set , SLR and sit to stand exercises. Continue with debride and dressing changes.  measure and photograph weekly.   ? Dressing  Rt knee: xeroform, 4X4, medipore tape   ? Dressing lateral ankle, toe medihoney, 2x2,medipore, kerlix, netting; R shin medihoney 2x2, hydrogel, 2x2 medipore   ? ?  ?  ? ?  ? ? ? ? ? ? ? ? ? ? ? ? PT Short Term Goals - 01/10/22 1725   ? ?  ? PT SHORT TERM GOAL #1  ? Title Patient wound will be free from slough to demonstrate wound healing.   ? Period Weeks   ? Status On-going   ? ?  ?  ? ?  ? ? ? ? PT Long Term Goals - 01/10/22 1725   ? ?  ? PT LONG TERM GOAL #1  ? Title Patient wound to be healed to reduce risk of infection.   ? Status On-going   ?  ? PT LONG TERM GOAL #2  ? Title Paitent will be able to transition to formal PT for knee rehab to improve functional mobility and strength.   ? Status On-going   ? ?  ?  ? ?  ? ? ? ? ? ? ? ? ? ?Patient will benefit  from skilled therapeutic intervention in order to improve the following deficits and impairments:    ? ?Visit Diagnosis: ?Cellulitis, wound, post-operative ? ?Other abnormalities of gait and mobility ? ?

## 2022-01-14 NOTE — Progress Notes (Signed)
FOLLOW UP  ? ?Encounter Diagnoses  ?Name Primary?  ? Non-healing surgical wound, subsequent encounter   ? Cellulitis, wound, post-operative   ? ? ? ?Chief Complaint  ?Patient presents with  ? Wound Check  ?  Right knee and leg DOS 11/02/21  ? ? ? ?Dior's knee wound is healing slowly but coming along its last deep and the length and width are closing and however he is developing other wounds related to his smoking history and diabetes ? ?He is still on antibiotics sulfamethoxazole and pain medication to help with his wound care and chronic pain ? ?Continue wound care follow-up in 2 weeks ? ?Encounter Diagnoses  ?Name Primary?  ? Non-healing surgical wound, subsequent encounter   ? Cellulitis, wound, post-operative   ? ? ?

## 2022-01-16 ENCOUNTER — Ambulatory Visit (HOSPITAL_COMMUNITY): Payer: Medicaid Other

## 2022-01-16 DIAGNOSIS — R2689 Other abnormalities of gait and mobility: Secondary | ICD-10-CM

## 2022-01-16 DIAGNOSIS — S81801A Unspecified open wound, right lower leg, initial encounter: Secondary | ICD-10-CM

## 2022-01-16 DIAGNOSIS — T8149XA Infection following a procedure, other surgical site, initial encounter: Secondary | ICD-10-CM | POA: Diagnosis not present

## 2022-01-16 NOTE — Therapy (Signed)
[facility] ?[address] ?[place], [place], [address] ?Phone: [phone]   Fax:  [phone] ? ?Wound Care Therapy ? ?Patient Details  ?Name: Danny [name] ?MRN: [id number] ?Date of Birth: [birth year] ?Referring Provider (PT): Arther Abbott MD ? ? ?Encounter Date: 01/16/2022 ? ? PT End of Session - 01/16/22 1826   ? ? Visit Number 17   ? Number of Visits 24   ? Date for PT Re-Evaluation 02/13/22   ? Authorization Type Medicaid - [employer].  Requested additional visits 01/01/22   ? Authorization Time Period [employer] approved 12 visits from 11/21/2021-03/21/2022 then an additional 12 visits from 4/21 thru 7/13( 24 visits approved from 11/21/2021-7/13   ? Authorization - Visit Number 6   ? Authorization - Number of Visits 12   ? Progress Note Due on Visit 12   ? PT Start Time 1315   ? PT Stop Time 1356   ? PT Time Calculation (min) 41 min   ? Activity Tolerance Patient tolerated treatment well   ? Behavior During Therapy [facility] for tasks assessed/performed   ? ?  ?  ? ?  ? ? ?Past Medical History:  ?Diagnosis Date  ? Alcohol abuse   ? Anxiety   ? Back pain   ? CVA (cerebral infarction)   ? Depression   ? Diabetes mellitus   ? Hypertension   ? Migraines   ? Stroke [facility])   ? ? ?Past Surgical History:  ?Procedure Laterality Date  ? CAST APPLICATION Right 0/53/9767  ? Procedure: CAST APPLICATION;  Surgeon: Carole Civil, MD;  Location: [facility];  Service: Orthopedics;  Laterality: Right;  ? FOOT SURGERY    ? INCISION AND DRAINAGE Right 11/02/2021  ? Procedure: INCISION AND DRAINAGE RIGHT KNEE;  Surgeon: Carole Civil, MD;  Location: [facility];  Service: Orthopedics;  Laterality: Right;  ? IR 3D INDEPENDENT WKST  10/11/2021  ? IR ANGIOGRAM SELECTIVE EACH ADDITIONAL VESSEL  10/11/2021  ? IR ANGIOGRAM SELECTIVE EACH ADDITIONAL VESSEL  10/11/2021  ? IR ANGIOGRAM SELECTIVE EACH ADDITIONAL VESSEL  10/11/2021  ? IR ANGIOGRAM SELECTIVE EACH ADDITIONAL VESSEL  10/11/2021  ? IR ANGIOGRAM  SELECTIVE EACH ADDITIONAL VESSEL  10/11/2021  ? IR ANGIOGRAM SELECTIVE EACH ADDITIONAL VESSEL  10/11/2021  ? IR ANGIOGRAM SELECTIVE EACH ADDITIONAL VESSEL  10/25/2021  ? IR ANGIOGRAM SELECTIVE EACH ADDITIONAL VESSEL  10/25/2021  ? IR ANGIOGRAM VISCERAL SELECTIVE  10/11/2021  ? IR EMBO TUMOR ORGAN ISCHEMIA INFARCT INC GUIDE ROADMAPPING  10/25/2021  ? IR EMBO TUMOR ORGAN ISCHEMIA INFARCT INC GUIDE ROADMAPPING  10/25/2021  ? IR RADIOLOGIST EVAL & MGMT  08/23/2021  ? IR RADIOLOGIST EVAL & MGMT  11/21/2021  ? IR US GUIDE VASC ACCESS RIGHT  10/11/2021  ? IR US GUIDE VASC ACCESS RIGHT  10/25/2021  ? MANDIBLE SURGERY    ? QUADRICEPS TENDON REPAIR Right 09/04/2021  ? Procedure: REPAIR QUADRICEP TENDON;  Surgeon: Carole Civil, MD;  Location: [facility];  Service: Orthopedics;  Laterality: Right;  ? TENDON EXPLORATION Right 11/02/2021  ? Procedure: QUADRICEPS TENDON AND PATELLAR TENDON EXPLORATION;  Surgeon: Carole Civil, MD;  Location: [facility];  Service: Orthopedics;  Laterality: Right;  ? ? ?There were no vitals filed for this visit. ? ? ? Subjective Assessment - 01/16/22 1358   ? ? Subjective Pt arrived with dressings intact, pain low today, maybe 2-3/10.   ? ?  ?  ? ?  ? ? ? ? ? ? ? ? ? ? ? ?  Wound Therapy - 01/16/22 0001   ? ? Subjective Pt arrived with dressings intact, pain low today, maybe 2-3/10.   ? Patient and Family Stated Goals wound to heal   ? Date of Onset 09/04/21   ? Prior Treatments dressing changes at MD office   ? Pain Scale 0-10   ? Pain Score 2    ? Pain Type Surgical pain   ? Pain Location Leg   ? Pain Orientation Right   ? Pain Descriptors / Indicators Sore   ? Evaluation and Treatment Procedures Explained to Patient/Family Yes   ? Evaluation and Treatment Procedures agreed to   ? Wound Properties Date First Assessed: 12/04/21 Time First Assessed: 1010 Location: Pretibial Location Orientation: Proximal;Right Present on Admission: -- , unsure   ? Wound Image Images linked: 1   ? Dressing Type --   medihoney,  vaseline perimeter, 2x2, medipore tape  ? Dressing Status Old drainage   ? Dressing Change Frequency PRN   ? Site / Wound Assessment Dry;Yellow   ? % Wound base Red or Granulating 15%   ? % Wound base Yellow/Fibrinous Exudate 85%   ? Peri-wound Assessment Erythema (blanchable)   ? Margins Unattached edges (unapproximated)   ? Drainage Amount Minimal   ? Drainage Description Serous   ? Treatment Debridement (Selective);Cleansed   ? Wound Properties Date First Assessed: 12/04/21 Time First Assessed: 1010 Location: Ankle Location Orientation: Right;Lateral Present on Admission: No  ? Dressing Type --   vaseline, medihoney, 2x2, tape  ? Dressing Status Old drainage   ? Dressing Change Frequency PRN   ? Site / Wound Assessment Dry;Brown;Yellow   ? % Wound base Red or Granulating 25%   ? % Wound base Yellow/Fibrinous Exudate 75%   ? Peri-wound Assessment Erythema (blanchable)   ? Margins Unattached edges (unapproximated)   ? Drainage Amount Minimal   ? Drainage Description Serous   ? Treatment Cleansed;Debridement (Selective)   ? Wound Properties Date First Assessed: 12/04/21 Location: Toe (Comment  which one) Location Orientation: Anterior;Right Wound Description (Comments): Great Toe Present on Admission: No  ? Dressing Type Gauze (Comment)   vaseline, medihoney, gauze and coban, netting  ? Dressing Status Intact   ? Dressing Change Frequency PRN   ? Site / Wound Assessment Pink;Yellow   ? % Wound base Red or Granulating 15%   ? % Wound base Yellow/Fibrinous Exudate 85%   ? Peri-wound Assessment Erythema (blanchable)   ? Margins Unattached edges (unapproximated)   ? Drainage Amount Scant   ? Drainage Description Serous   ? Treatment Cleansed;Debridement (Selective)   ? Wound Properties Date First Assessed: 11/21/21 Time First Assessed: 1130 Wound Type: Incision - Dehisced Location: Knee Location Orientation: Anterior;Right Present on Admission: Yes  ? Wound Image Images linked: 1   ? Dressing Type Bismuth petroleum   ?  Dressing Status Dry   ? Dressing Change Frequency PRN   ? Site / Wound Assessment Red;Yellow   ? % Wound base Red or Granulating 100%   ? % Wound base Yellow/Fibrinous Exudate 0%   ? Wound Length (cm) 3.1 cm   ? Wound Width (cm) 2.4 cm   ? Wound Surface Area (cm^2) 7.44 cm^2   ? Margins Epibole (rolled edges)   ? Drainage Amount Minimal   ? Drainage Description Serosanguineous   ? Selective Debridement - Location Wound beds and periwounds   ? Selective Debridement - Tools Used Forceps;Scalpel   ? Selective Debridement - Tissue Removed slough   ?  Wound Therapy - Clinical Statement Pt arrived wiht cane with that was bent at end, encouraged to get another cane for safety during gait.  Knee wound is progressing well wtih improved granulation tissue and reduction in size.  The foam has assisted wiht hypergranulation well.  Selective debridment for removal of biofilm knee wound and adherent slough from wound bed over wounds.  Continued with medihoney to address slough and xeroform on knee wound.  Pt continues to demonstrate extreme weakness with thigh, reminded to complete 100 reps a day and increase hold time for isometric strengthening.   ? Wound Therapy - Functional Problem List gait, bathing, dressing   ? Factors Delaying/Impairing Wound Healing Diabetes Mellitus;Immobility;Multiple medical problems;Tobacco use   ? Hydrotherapy Plan Debridement;Dressing change;Electrical stimulation;Patient/family education;Pulsatile lavage with suction   ? Wound Therapy - Frequency 2X / week   ? Wound Therapy - Current Recommendations PT   ? Wound Plan Forgot to give handout, give next session.  Give PT HEP to inclde LAQ< SAQ, Quad set , SLR and sit to stand exercises. Continue with debride and dressing changes.  measure and photograph weekly.   ? Dressing  Rt knee: xeroform, 4X4, medipore tape   ? Dressing lateral ankle, toe medihoney, 2x2,medipore, kerlix, netting; R shin medihoney 2x2, hydrogel, 2x2 medipore   ? ?  ?  ? ?   ? ? ? ? ? ? ? ? ? ? ? ? PT Short Term Goals - 01/10/22 1725   ? ?  ? PT SHORT TERM GOAL #1  ? Title Patient wound will be free from slough to demonstrate wound healing.   ? Period Weeks   ? Status On-going   ? ?  ?

## 2022-01-18 ENCOUNTER — Ambulatory Visit (HOSPITAL_COMMUNITY)
Admission: RE | Admit: 2022-01-18 | Discharge: 2022-01-18 | Disposition: A | Discharge status: Home / Self Care | Payer: Medicaid Other | Source: Ambulatory Visit | Attending: Interventional Radiology | Admitting: Interventional Radiology

## 2022-01-18 DIAGNOSIS — C220 Liver cell carcinoma: Secondary | ICD-10-CM | POA: Insufficient documentation

## 2022-01-18 MED ORDER — GADOBUTROL 1 MMOL/ML IV SOLN
7.5000 mL | Freq: Once | INTRAVENOUS | Status: AC | PRN
  Administered 2022-01-18: 7.5 mL via INTRAVENOUS

## 2022-01-20 ENCOUNTER — Other Ambulatory Visit: Payer: Self-pay | Admitting: Orthopedic Surgery

## 2022-01-20 DIAGNOSIS — T8189XD Other complications of procedures, not elsewhere classified, subsequent encounter: Secondary | ICD-10-CM

## 2022-01-21 MED ORDER — OXYCODONE-ACETAMINOPHEN 10-325 MG PO TABS: 1.0000 | ORAL_TABLET | ORAL | 0 refills | Status: DC | PRN

## 2022-01-23 ENCOUNTER — Ambulatory Visit (HOSPITAL_COMMUNITY): Payer: Medicaid Other | Admitting: Physical Therapy

## 2022-01-23 DIAGNOSIS — T8149XA Infection following a procedure, other surgical site, initial encounter: Secondary | ICD-10-CM

## 2022-01-23 DIAGNOSIS — R2689 Other abnormalities of gait and mobility: Secondary | ICD-10-CM

## 2022-01-23 DIAGNOSIS — S81801A Unspecified open wound, right lower leg, initial encounter: Secondary | ICD-10-CM

## 2022-01-23 NOTE — Therapy (Signed)
[place] ?[place] ?[address] ?[place], [place], [address] ?Phone: [phone]   Fax:  [phone] ? ?Wound Care Therapy ? ?Patient Details  ?Name: Danny [name] ?MRN: [id number] ?Date of Birth: 04/23/1966 ?Referring Provider (PT): Arther Abbott MD ? ? ?Encounter Date: 01/23/2022 ? ? PT End of Session - 01/23/22 1035   ? ? Visit Number 15   ? Number of Visits 24   ? Date for PT Re-Evaluation 02/13/22   ? Authorization Type Medicaid - [employer].  Requested additional visits 01/01/22   ? Authorization Time Period [employer] approved 12 visits from 11/21/2021-03/21/2022 then an additional 12 visits from 4/21 thru 7/13( 24 visits approved from 11/21/2021-7/13   ? Authorization - Visit Number 7   ? Authorization - Number of Visits 12   ? Progress Note Due on Visit 12   ? PT Start Time [phone]   ? PT Stop Time 1015   ? PT Time Calculation (min) 47 min   ? Activity Tolerance Patient tolerated treatment well   ? Behavior During Therapy [facility] for tasks assessed/performed   ? ?  ?  ? ?  ? ? ?Past Medical History:  ?Diagnosis Date  ? Alcohol abuse   ? Anxiety   ? Back pain   ? CVA (cerebral infarction)   ? Depression   ? Diabetes mellitus   ? Hypertension   ? Migraines   ? Stroke [facility])   ? ? ?Past Surgical History:  ?Procedure Laterality Date  ? CAST APPLICATION Right 6/76/1950  ? Procedure: CAST APPLICATION;  Surgeon: Carole Civil, MD;  Location: [facility];  Service: Orthopedics;  Laterality: Right;  ? FOOT SURGERY    ? INCISION AND DRAINAGE Right 11/02/2021  ? Procedure: INCISION AND DRAINAGE RIGHT KNEE;  Surgeon: Carole Civil, MD;  Location: [facility];  Service: Orthopedics;  Laterality: Right;  ? IR 3D INDEPENDENT WKST  10/11/2021  ? IR ANGIOGRAM SELECTIVE EACH ADDITIONAL VESSEL  10/11/2021  ? IR ANGIOGRAM SELECTIVE EACH ADDITIONAL VESSEL  10/11/2021  ? IR ANGIOGRAM SELECTIVE EACH ADDITIONAL VESSEL  10/11/2021  ? IR ANGIOGRAM SELECTIVE EACH ADDITIONAL VESSEL  10/11/2021  ? IR ANGIOGRAM  SELECTIVE EACH ADDITIONAL VESSEL  10/11/2021  ? IR ANGIOGRAM SELECTIVE EACH ADDITIONAL VESSEL  10/11/2021  ? IR ANGIOGRAM SELECTIVE EACH ADDITIONAL VESSEL  10/25/2021  ? IR ANGIOGRAM SELECTIVE EACH ADDITIONAL VESSEL  10/25/2021  ? IR ANGIOGRAM VISCERAL SELECTIVE  10/11/2021  ? IR EMBO TUMOR ORGAN ISCHEMIA INFARCT INC GUIDE ROADMAPPING  10/25/2021  ? IR EMBO TUMOR ORGAN ISCHEMIA INFARCT INC GUIDE ROADMAPPING  10/25/2021  ? IR RADIOLOGIST EVAL & MGMT  08/23/2021  ? IR RADIOLOGIST EVAL & MGMT  11/21/2021  ? IR US GUIDE VASC ACCESS RIGHT  10/11/2021  ? IR US GUIDE VASC ACCESS RIGHT  10/25/2021  ? MANDIBLE SURGERY    ? QUADRICEPS TENDON REPAIR Right 09/04/2021  ? Procedure: REPAIR QUADRICEP TENDON;  Surgeon: Carole Civil, MD;  Location: [facility];  Service: Orthopedics;  Laterality: Right;  ? TENDON EXPLORATION Right 11/02/2021  ? Procedure: QUADRICEPS TENDON AND PATELLAR TENDON EXPLORATION;  Surgeon: Carole Civil, MD;  Location: [facility];  Service: Orthopedics;  Laterality: Right;  ? ? ?There were no vitals filed for this visit. ? ? ? ? ? ? ? ? ? ? ? ? ? ? Wound Therapy - 01/23/22 1036   ? ? Subjective pt states he is doing well, walking better with SPC   ? Patient and Family  Stated Goals wound to heal   ? Date of Onset 09/04/21   ? Prior Treatments dressing changes at MD office   ? Pain Scale 0-10   ? Pain Score 0-No pain   ? Evaluation and Treatment Procedures Explained to Patient/Family Yes   ? Evaluation and Treatment Procedures agreed to   ? Wound Properties Date First Assessed: 12/04/21 Time First Assessed: 1010 Location: Pretibial Location Orientation: Proximal;Right Present on Admission: -- , unsure   ? Wound Image Images linked: 1   ? Dressing Type Gauze (Comment)   medihoney, vaseline perimeter, 2x2, medipore tape  ? Dressing Changed Changed   ? Dressing Status Old drainage   ? Dressing Change Frequency PRN   ? Site / Wound Assessment Dry;Yellow   ? % Wound base Red or Granulating 30%   ? % Wound base  Yellow/Fibrinous Exudate 70%   ? Peri-wound Assessment Erythema (blanchable)   ? Wound Length (cm) 2.1 cm   ? Wound Width (cm) 2 cm   ? Wound Surface Area (cm^2) 4.2 cm^2   ? Margins Unattached edges (unapproximated)   ? Drainage Amount Minimal   ? Drainage Description Serous   ? Treatment Cleansed;Debridement (Selective)   ? Wound Properties Date First Assessed: 12/04/21 Time First Assessed: 1010 Location: Ankle Location Orientation: Right;Lateral Present on Admission: No  ? Wound Image Images linked: 1   ? Dressing Type Gauze (Comment)   vaseline, medihoney, 2x2, tape  ? Dressing Changed Changed   ? Dressing Status Old drainage   ? Dressing Change Frequency PRN   ? Site / Wound Assessment Dry;Brown;Yellow   ? % Wound base Red or Granulating 20%   ? % Wound base Yellow/Fibrinous Exudate 80%   ? Peri-wound Assessment Erythema (blanchable)   ? Wound Length (cm) 0.6 cm   ? Wound Width (cm) 0.3 cm   ? Wound Surface Area (cm^2) 0.18 cm^2   ? Margins Attached edges (approximated)   ? Drainage Amount Scant   ? Drainage Description Serous   ? Treatment Cleansed;Debridement (Selective)   ? Wound Properties Date First Assessed: 12/04/21 Location: Toe (Comment  which one) Location Orientation: Anterior;Right Wound Description (Comments): Great Toe Present on Admission: No  ? Wound Image Images linked: 1   ? Dressing Type Gauze (Comment)   vaseline, medihoney, gauze and coban, netting  ? Dressing Changed Changed   ? Dressing Status Intact   ? Dressing Change Frequency PRN   ? Site / Wound Assessment Pink;Yellow   ? % Wound base Red or Granulating 30%   ? % Wound base Yellow/Fibrinous Exudate 70%   ? Peri-wound Assessment Erythema (blanchable)   ? Wound Length (cm) 0.4 cm   ? Wound Width (cm) 0.4 cm   ? Wound Depth (cm) 0.2 cm   ? Wound Volume (cm^3) 0.03 cm^3   ? Wound Surface Area (cm^2) 0.16 cm^2   ? Margins Unattached edges (unapproximated)   ? Drainage Amount Scant   ? Drainage Description Serous   ? Treatment  Cleansed;Debridement (Selective)   ? Wound Properties Date First Assessed: 11/21/21 Time First Assessed: 1130 Wound Type: Incision - Dehisced Location: Knee Location Orientation: Anterior;Right Present on Admission: Yes  ? Wound Image Images linked: 1   ? Dressing Type Bismuth petroleum   ? Dressing Changed Changed   ? Dressing Status Dry   ? Dressing Change Frequency PRN   ? Site / Wound Assessment Red;Yellow   ? % Wound base Red or Granulating 100%   98%  prior to debridement  ? % Wound base Yellow/Fibrinous Exudate 0%   ? Wound Length (cm) 2.8 cm   ? Wound Width (cm) 2 cm   ? Wound Depth (cm) --   slight hypergranulated above wound borders  ? Wound Surface Area (cm^2) 5.6 cm^2   ? Margins Epibole (rolled edges)   ? Drainage Amount Scant   ? Drainage Description Serosanguineous   ? Treatment Cleansed;Debridement (Selective)   ? Selective Debridement - Location Wound beds and periwounds   ? Selective Debridement - Tools Used Forceps;Scalpel   ? Selective Debridement - Tissue Removed slough   ? Wound Therapy - Clinical Statement Wounds photographed and remeasured this session.  All wounds are approximating and granulated well, however shin wound is measuring larger today.  Larger measurements equated to more slough removed making margins healthier but wound larger.  Continued with xerform on knee and all others with medihoney.  1/2" foam applied to knee and shin wound today today help reduce hypergranulation and promote approximation of wound borders.  Kerlix used over these to help pad bony areas from brace.  Pt did have some dry skin between great and 2nd toes but without wound present.  will need to monitor this area.   ? Wound Therapy - Functional Problem List gait, bathing, dressing   ? Factors Delaying/Impairing Wound Healing Diabetes Mellitus;Immobility;Multiple medical problems;Tobacco use   ? Hydrotherapy Plan Debridement;Dressing change;Electrical stimulation;Patient/family education;Pulsatile lavage with  suction   ? Wound Therapy - Frequency 2X / week   ? Wound Therapy - Current Recommendations PT   ? Wound Plan Continue with debride and dressing changes.  measure and photograph weekly.   ? Dressing  Rt knee: xeroform, 4X4, 1/2" foam,

## 2022-01-24 ENCOUNTER — Ambulatory Visit
Admission: RE | Admit: 2022-01-24 | Discharge: 2022-01-24 | Disposition: A | Discharge status: Home / Self Care | Payer: Medicaid Other | Source: Ambulatory Visit | Attending: Interventional Radiology | Admitting: Interventional Radiology

## 2022-01-24 ENCOUNTER — Encounter: Payer: Self-pay | Admitting: *Deleted

## 2022-01-24 DIAGNOSIS — C220 Liver cell carcinoma: Secondary | ICD-10-CM

## 2022-01-24 HISTORY — PX: IR RADIOLOGIST EVAL & MGMT: IMG5224

## 2022-01-24 NOTE — Progress Notes (Signed)
Referring Physician(s): Roosevelt Locks, NP   Reason for follow up: 1 month status post left hepatic transarterial radioembolization   History of present illness: Danny [name] is a 56 y.o. male with history of alcoholic cirrhosis and multifocal hepatocellular carcinoma.  He had complained of right upper quadrant pain for approximately 1 year.  Multiple imaging studies eventually led to a CT which demonstrated enhancing lesions in the left lobe of the liver.  This prompted and MRI which demonstrates a 2.7 cm LIRADS 5 mass in segment II and LIRADS 3 lesion in the right dome.  There was a 1.6 cm mass on prior CTA in segment III which was inconspicuous on MR, but highly suspicious for additional HCC.    He and his POA join today via telephone virtual visit.  He is now status post left hepatic TARE on 10/25/21.  Approximately 50% of the dose was delivered in segmentectomy fashion to the index segment II hepatoma followed by delivery of the remaining in a lobar distribution.  Initial 3 month post-TARE MRI abdomen was recently performed.  He presents today to review results and discuss next steps.  He has overall been feeling well.  No nausea or vomiting.  He has some persistent mild fatigue.  He describes a dull upper abdominal pain, similar to the pain he has experienced over the past year, no worsening.  No recent fevers, chills, or jaundice.    Past Medical History:  Diagnosis Date   Alcohol abuse    Anxiety    Back pain    CVA (cerebral infarction)    Depression    Diabetes mellitus    Hypertension    Migraines    Stroke [facility])     Past Surgical History:  Procedure Laterality Date   CAST APPLICATION Right 9/83/3825   Procedure: CAST APPLICATION;  Surgeon: Carole Civil, MD;  Location: [facility];  Service: Orthopedics;  Laterality: Right;   FOOT SURGERY     INCISION AND DRAINAGE Right 11/02/2021   Procedure: INCISION AND DRAINAGE RIGHT KNEE;  Surgeon: Carole Civil, MD;   Location: [facility];  Service: Orthopedics;  Laterality: Right;   IR 3D INDEPENDENT WKST  10/11/2021   IR ANGIOGRAM SELECTIVE EACH ADDITIONAL VESSEL  10/11/2021   IR ANGIOGRAM SELECTIVE EACH ADDITIONAL VESSEL  10/11/2021   IR ANGIOGRAM SELECTIVE EACH ADDITIONAL VESSEL  10/11/2021   IR ANGIOGRAM SELECTIVE EACH ADDITIONAL VESSEL  10/11/2021   IR ANGIOGRAM SELECTIVE EACH ADDITIONAL VESSEL  10/11/2021   IR ANGIOGRAM SELECTIVE EACH ADDITIONAL VESSEL  10/11/2021   IR ANGIOGRAM SELECTIVE EACH ADDITIONAL VESSEL  10/25/2021   IR ANGIOGRAM SELECTIVE EACH ADDITIONAL VESSEL  10/25/2021   IR ANGIOGRAM VISCERAL SELECTIVE  10/11/2021   IR EMBO TUMOR ORGAN ISCHEMIA INFARCT INC GUIDE ROADMAPPING  10/25/2021   IR EMBO TUMOR ORGAN ISCHEMIA INFARCT INC GUIDE ROADMAPPING  10/25/2021   IR RADIOLOGIST EVAL & MGMT  08/23/2021   IR RADIOLOGIST EVAL & MGMT  11/21/2021   IR US GUIDE VASC ACCESS RIGHT  10/11/2021   IR US GUIDE VASC ACCESS RIGHT  10/25/2021   MANDIBLE SURGERY     QUADRICEPS TENDON REPAIR Right 09/04/2021   Procedure: REPAIR QUADRICEP TENDON;  Surgeon: Carole Civil, MD;  Location: [facility];  Service: Orthopedics;  Laterality: Right;   TENDON EXPLORATION Right 11/02/2021   Procedure: QUADRICEPS TENDON AND PATELLAR TENDON EXPLORATION;  Surgeon: Carole Civil, MD;  Location: [facility];  Service: Orthopedics;  Laterality: Right;    Allergies: Ultram [  tramadol] and Benadryl [diphenhydramine]  Medications: Prior to Admission medications   Medication Sig Start Date End Date Taking? Authorizing Provider  albuterol (VENTOLIN HFA) 108 (90 Base) MCG/ACT inhaler Inhale 2 puffs into the lungs every 6 (six) hours as needed for wheezing or shortness of breath.    [provider]  busPIRone (BUSPAR) 15 MG tablet Take 15 mg by mouth 2 (two) times daily.    [provider]  Cholecalciferol (VITAMIN D-3) 125 MCG (5000 UT) TABS Take 5,000 Units by mouth daily.    [provider]  citalopram (CELEXA) 20 MG  tablet Take 20 mg by mouth daily.    [provider]  gabapentin (NEURONTIN) 600 MG tablet Take 600 mg by mouth in the morning and at bedtime. 06/24/21   [provider]  hydrOXYzine (ATARAX) 25 MG tablet Take 25 mg by mouth 3 (three) times daily as needed for anxiety.    [provider]  meloxicam (MOBIC) 7.5 MG tablet Take 7.5 mg by mouth in the morning and at bedtime.    [provider]  metFORMIN (GLUCOPHAGE) 500 MG tablet Take 1 tablet (500 mg total) by mouth 2 (two) times daily with a meal. 01/01/18   Julianne Rice, MD  omeprazole (PRILOSEC) 40 MG capsule Take 1 capsule (40 mg total) by mouth daily before breakfast. 06/06/21   Erenest Rasher, PA-C  oxyCODONE-acetaminophen (PERCOCET) 10-325 MG tablet Take 1 tablet by mouth every 4 (four) hours as needed for pain. 01/21/22   Carole Civil, MD  QUEtiapine (SEROQUEL) 100 MG tablet Take 100 mg by mouth at bedtime.    [provider]  sulfamethoxazole-trimethoprim (BACTRIM DS) 800-160 MG tablet Take 1 tablet by mouth 2 (two) times daily. 01/14/22   Carole Civil, MD     Family History  Problem Relation Age of Onset   Heart failure Mother    Stroke Father    Stroke Sister    Cancer Sister        breast cancer   Colon cancer Neg Hx    Liver cancer Neg Hx     Social History   Socioeconomic History   Marital status: Legally Separated    Spouse name: Not on file   Number of children: Not on file   Years of education: Not on file   Highest education level: Not on file  Occupational History   Not on file  Tobacco Use   Smoking status: Every Day    Packs/day: 2.00    Years: 20.00    Pack years: 40.00    Types: Cigarettes   Smokeless tobacco: Never  Vaping Use   Vaping Use: Never used  Substance and Sexual Activity   Alcohol use: Not Currently    Comment: Quit 11 months ago. Used to drink about 6 pack a day. (documented 06/06/21)   Drug use: Yes    Types: Marijuana     Comment: occas   Sexual activity: Yes    Birth control/protection: None  Other Topics Concern   Not on file  Social History Narrative   Not on file   Social Determinants of Health   Financial Resource Strain: Not on file  Food Insecurity: Not on file  Transportation Needs: Not on file  Physical Activity: Not on file  Stress: Not on file  Social Connections: Not on file     Vital Signs: There were no vitals taken for this visit.  No physical examination was performed in lieu of virtual  telephone clinic visit.   Imaging: 10/11/21 Tc-MAA mapping study   06/29/21 MRI  Right dome, LR3   Seg II, 2.5 cm, LR5  01/18/22 MRI  Right dome, 1.7 cm, LR5   Segment 8, 0.8 cm, LR3    Seg II, LR-TR, nonviable    Labs:  CBC: Recent Labs    07/10/21 1947 09/26/21 1239 10/11/21 0743 10/25/21 0848  WBC 7.4 7.9 5.9 8.1  HGB 14.6 15.1 14.6 15.0  HCT 43.1 45.5 42.1 44.0  PLT 151 156 113* 113*    COAGS: Recent Labs    06/11/21 0907 10/11/21 0743 10/25/21 0848  INR 1.1 1.1 1.1    BMP: Recent Labs    07/10/21 1947 09/24/21 1005 09/26/21 1239 10/11/21 0743 10/25/21 0848  NA 132*  --  139 135 137  K 3.7  --  4.1 4.1 3.9  CL 99  --  104 106 105  CO2 26  --  '24 23 22  '$ GLUCOSE 140*  --  115* 166* 135*  BUN 18  --  '13 14 14  '$ CALCIUM 8.7*  --  9.5 9.1 9.2  CREATININE 0.80 0.90 0.85 0.78 0.78  GFRNONAA >60  --  >60 >60 >60    LIVER FUNCTION TESTS: Recent Labs    06/11/21 0907 09/26/21 1239 10/11/21 0743 10/25/21 0848  BILITOT 0.8 0.9 0.6 0.7  AST 61* 63* 41 48*  ALT 71* 49* 46* 43  ALKPHOS  --  105 116 114  PROT 7.1 7.2 6.8 7.8  ALBUMIN  --  3.6 3.4* 4.0    Assessment and Plan: 56 year old male with recently diagnosed alcoholic cirrhosis and multifocal, left lobe dominant hepatocellular carcinoma now status post left lobar transarterial radioembolization (29.1 mCi) on 10/25/21.  He is recovering well at 3 months.  MRI demonstrates two enlarging  masses in the right lobe, likely both in segment 8, the largest LR5 and the smaller LR3.  Excellent treatment response to the segment II index lesion, LR-TR nonviable.  We discussed options for next steps including watchful waiting and additional radioembolization to the right lobe.  He is very motivated to prolong survival and do everything he can at this point due to recent birth of identical twin grandsons.  He is interested in pursuing TARE.  Given the lesions appear to both be in segment 8, additional liver sparing segmentectomy can likely be performed.  We will plan to repeat a mapping study prior to Y90 administration to confirm vascular distribution.  Plan for repeat hepatic angiogram with Tc-MAA administration followed by right lobe Y-90 administration approximately 2 weeks later at [facility].  Will need repeat CBC, CMP, INR, and a-FP on the procedure day.    Electronically Signed: Suzette Battiest 01/24/2022, 9:54 AM   I spent a total of 25 Minutes in telephone virtual clinical consultation, greater than 50% of which was counseling/coordinating care for hepatocellular carcinoma.

## 2022-01-25 ENCOUNTER — Ambulatory Visit (HOSPITAL_COMMUNITY): Payer: Medicaid Other | Admitting: Physical Therapy

## 2022-01-25 DIAGNOSIS — T8149XA Infection following a procedure, other surgical site, initial encounter: Secondary | ICD-10-CM | POA: Diagnosis not present

## 2022-01-25 DIAGNOSIS — S81801A Unspecified open wound, right lower leg, initial encounter: Secondary | ICD-10-CM

## 2022-01-25 DIAGNOSIS — R2689 Other abnormalities of gait and mobility: Secondary | ICD-10-CM

## 2022-01-25 NOTE — Therapy (Signed)
[place], [place], [address] Phone: [phone]   Fax:  [phone]  Wound Care Therapy  Patient Details  Name: Danny [name] MRN: [id number] Date of Birth: 08-03-1966 Referring Provider (PT): Arther Abbott MD   Encounter Date: 01/25/2022   PT End of Session - 01/25/22 1210     Visit Number 16    Number of Visits 24    Date for PT Re-Evaluation 02/13/22    Authorization Type Medicaid - [employer].  Requested additional visits 01/01/22    Authorization Time Period [employer] approved 12 visits from 11/21/2021-03/21/2022 then an additional 12 visits from 4/21 thru 7/13( 24 visits approved from 11/21/2021-7/13    Authorization - Visit Number 8    Authorization - Number of Visits 12    Progress Note Due on Visit 12    PT Start Time 1000    PT Stop Time 1043    PT Time Calculation (min) 43 min    Activity Tolerance Patient tolerated treatment well    Behavior During Therapy [facility] for tasks assessed/performed             Past Medical History:  Diagnosis Date   Alcohol abuse    Anxiety    Back pain    CVA (cerebral infarction)    Depression    Diabetes mellitus    Hypertension    Migraines    Stroke [facility])     Past Surgical History:  Procedure Laterality Date   CAST APPLICATION Right 2/62/0355   Procedure: CAST APPLICATION;  Surgeon: Carole Civil, MD;  Location: [facility]S;  Service: Orthopedics;  Laterality: Right;   FOOT SURGERY     INCISION AND DRAINAGE Right 11/02/2021   Procedure: INCISION AND DRAINAGE RIGHT KNEE;  Surgeon: Carole Civil, MD;  Location: [facility] ORS;  Service: Orthopedics;  Laterality: Right;   IR 3D INDEPENDENT WKST  10/11/2021   IR ANGIOGRAM SELECTIVE EACH ADDITIONAL VESSEL  10/11/2021   IR ANGIOGRAM SELECTIVE EACH ADDITIONAL VESSEL  10/11/2021   IR ANGIOGRAM SELECTIVE EACH ADDITIONAL VESSEL  10/11/2021   IR ANGIOGRAM SELECTIVE EACH ADDITIONAL VESSEL  10/11/2021   IR ANGIOGRAM  SELECTIVE EACH ADDITIONAL VESSEL  10/11/2021   IR ANGIOGRAM SELECTIVE EACH ADDITIONAL VESSEL  10/11/2021   IR ANGIOGRAM SELECTIVE EACH ADDITIONAL VESSEL  10/25/2021   IR ANGIOGRAM SELECTIVE EACH ADDITIONAL VESSEL  10/25/2021   IR ANGIOGRAM VISCERAL SELECTIVE  10/11/2021   IR EMBO TUMOR ORGAN ISCHEMIA INFARCT INC GUIDE ROADMAPPING  10/25/2021   IR EMBO TUMOR ORGAN ISCHEMIA INFARCT INC GUIDE ROADMAPPING  10/25/2021   IR RADIOLOGIST EVAL & MGMT  08/23/2021   IR RADIOLOGIST EVAL & MGMT  11/21/2021   IR RADIOLOGIST EVAL & MGMT  01/24/2022   IR US GUIDE VASC ACCESS RIGHT  10/11/2021   IR US GUIDE VASC ACCESS RIGHT  10/25/2021   MANDIBLE SURGERY     QUADRICEPS TENDON REPAIR Right 09/04/2021   Procedure: REPAIR QUADRICEP TENDON;  Surgeon: Carole Civil, MD;  Location: [facility];  Service: Orthopedics;  Laterality: Right;   TENDON EXPLORATION Right 11/02/2021   Procedure: QUADRICEPS TENDON AND PATELLAR TENDON EXPLORATION;  Surgeon: Carole Civil, MD;  Location: [facility];  Service: Orthopedics;  Laterality: Right;    There were no vitals filed for this visit.               Wound Therapy - 01/25/22 1211     Subjective pt states he fell the other day  and is sore; really needs to get stronger.  Reviewed exercises given for HEP; still unable to complete active LAQ on Rt.    Patient and Family Stated Goals wound to heal    Date of Onset 09/04/21    Prior Treatments dressing changes at MD office    Pain Scale 0-10    Pain Score 0-No pain    Evaluation and Treatment Procedures Explained to Patient/Family Yes    Evaluation and Treatment Procedures agreed to    Wound Properties Date First Assessed: 12/04/21 Time First Assessed: 1010 Location: Pretibial Location Orientation: Proximal;Right Present on Admission: -- , unsure    Dressing Type Gauze (Comment)   medihoney, vaseline perimeter, 2x2, medipore tape   Dressing Changed Changed    Dressing Status Old drainage    Dressing Change Frequency PRN     Site / Wound Assessment Dry;Yellow    % Wound base Red or Granulating 50%    % Wound base Yellow/Fibrinous Exudate 50%    Peri-wound Assessment Erythema (blanchable)    Margins Unattached edges (unapproximated)    Drainage Amount Minimal    Drainage Description Serosanguineous    Treatment Cleansed;Debridement (Selective)    Wound Properties Date First Assessed: 12/04/21 Time First Assessed: 1010 Location: Ankle Location Orientation: Right;Lateral Present on Admission: No   Dressing Type Gauze (Comment)   vaseline, medihoney, 2x2, tape   Dressing Changed Changed    Dressing Status Old drainage    Dressing Change Frequency PRN    Site / Wound Assessment Dry;Brown;Yellow    % Wound base Red or Granulating 20%    % Wound base Yellow/Fibrinous Exudate 80%    Peri-wound Assessment Erythema (blanchable)    Margins Attached edges (approximated)    Drainage Amount Scant    Drainage Description Serous    Treatment Cleansed;Debridement (Selective)    Wound Properties Date First Assessed: 12/04/21 Location: Toe (Comment  which one) Location Orientation: Anterior;Right Wound Description (Comments): Great Toe Present on Admission: No   Dressing Type Gauze (Comment)   vaseline, medihoney, gauze and coban, netting   Dressing Changed Changed    Dressing Status Intact    Dressing Change Frequency PRN    Site / Wound Assessment Pink;Yellow    % Wound base Red or Granulating 30%    % Wound base Yellow/Fibrinous Exudate 70%    Peri-wound Assessment Erythema (blanchable)    Margins Unattached edges (unapproximated)    Drainage Amount Scant    Drainage Description Serous    Treatment Debridement (Selective)    Wound Properties Date First Assessed: 11/21/21 Time First Assessed: 1130 Wound Type: Incision - Dehisced Location: Knee Location Orientation: Anterior;Right Present on Admission: Yes   Dressing Type Bismuth petroleum    Dressing Changed Changed    Dressing Status Old drainage    Dressing  Change Frequency PRN    Site / Wound Assessment Red;Yellow    % Wound base Red or Granulating 100%    Margins Epibole (rolled edges)    Drainage Amount Scant    Drainage Description Serosanguineous    Treatment Cleansed;Debridement (Selective)    Selective Debridement - Location Wound beds and periwounds    Selective Debridement - Tools Used Forceps;Scalpel    Selective Debridement - Tissue Removed slough    Wound Therapy - Clinical Statement All wounds continue to approximate with increasing granulation.continued  with xerform on knee and all others with medihoney.  1/2" foam applied to knee and shin wound today today help reduce hypergranulation and promote approximation  of wound borders. Foam also added to medial and lateral knee to help pad bony areas from brace.    Wound Therapy - Functional Problem List gait, bathing, dressing    Factors Delaying/Impairing Wound Healing Diabetes Mellitus;Immobility;Multiple medical problems;Tobacco use    Hydrotherapy Plan Debridement;Dressing change;Electrical stimulation;Patient/family education;Pulsatile lavage with suction    Wound Therapy - Frequency 2X / week    Wound Therapy - Current Recommendations PT    Wound Plan Continue with debridement and dressing changes.  measure and photograph weekly.    Dressing  Rt knee: xeroform, 4X4, 1/2" foam, medipore tape    Dressing lateral ankle, toe medihoney, 2x2,medipore, kerlix, netting; R shin medihoney 2x2, hydrogel, 2x2 medipore                       PT Short Term Goals - 01/10/22 1725       PT SHORT TERM GOAL #1   Title Patient wound will be free from slough to demonstrate wound healing.    Period Weeks    Status On-going               PT Long Term Goals - 01/14/22 1503       PT LONG TERM GOAL #1   Title Patient wound to be healed to reduce risk of infection.    Status On-going      PT LONG TERM GOAL #2   Title Paitent will be able to transition to formal PT for knee  rehab to improve functional mobility and strength.    Status On-going      PT LONG TERM GOAL #3   Title PT to be walking with least assistive device    Time 3    Status New                    Patient will benefit from skilled therapeutic intervention in order to improve the following deficits and impairments:     Visit Diagnosis: Cellulitis, wound, post-operative  Other abnormalities of gait and mobility  Multiple open wounds of right lower extremity, initial encounter     Problem List Patient Active Problem List   Diagnosis Date Noted   Wound infection after surgery 09/27/2021   Postoperative wound cellulitis 09/26/2021   Rupture of right quadriceps tendon    Chronic hepatitis C without hepatic coma ([place]) 08/08/2021   Hepatocellular carcinoma ([place]) 08/08/2021   COPD (chronic obstructive pulmonary disease) ([place]) 08/08/2021   Hypertension 08/08/2021   Other cirrhosis of liver ([place]) 08/08/2021   Type 2 diabetes mellitus ([place]) 08/08/2021   Generalized abdominal pain 06/06/2021   Dysphagia 06/06/2021   Gastroesophageal reflux disease 06/06/2021   Abnormal liver ultrasound 06/06/2021   Colon cancer screening 06/06/2021   Elevated LFTs 06/06/2021   GREATER TROCHANTERIC BURSITIS 07/03/2007   INCREASED BLOOD PRESSURE 07/03/2007   BACK PAIN, LUMBAR 05/29/2007   NUMBNESS 05/29/2007   INSOMNIA, CHRONIC 05/13/2007   HYPERLIPIDEMIA 01/13/2007   ANXIETY DEPRESSION 01/13/2007   SYNDROME, CHRONIC PAIN 01/13/2007   CVA 01/13/2007   ARTHRITIS 01/13/2007   Teena Irani, PTA/CLT [place] Ph: [phone]  Veva Holes 01/25/2022, 12:16 PM  [place] [address] [place], [place], [address] Phone: [phone]   Fax:  [phone]  Name: Danny [name] MRN: [id number] Date of Birth: 19-Jun-1966

## 2022-01-27 ENCOUNTER — Other Ambulatory Visit: Payer: Self-pay | Admitting: Orthopedic Surgery

## 2022-01-27 DIAGNOSIS — T8189XD Other complications of procedures, not elsewhere classified, subsequent encounter: Secondary | ICD-10-CM

## 2022-01-28 ENCOUNTER — Other Ambulatory Visit (HOSPITAL_COMMUNITY): Payer: Self-pay | Admitting: Interventional Radiology

## 2022-01-28 ENCOUNTER — Ambulatory Visit (INDEPENDENT_AMBULATORY_CARE_PROVIDER_SITE_OTHER): Payer: Medicaid Other | Admitting: Orthopedic Surgery

## 2022-01-28 ENCOUNTER — Ambulatory Visit (HOSPITAL_COMMUNITY): Payer: Medicaid Other | Admitting: Physical Therapy

## 2022-01-28 DIAGNOSIS — R2689 Other abnormalities of gait and mobility: Secondary | ICD-10-CM

## 2022-01-28 DIAGNOSIS — T8149XA Infection following a procedure, other surgical site, initial encounter: Secondary | ICD-10-CM

## 2022-01-28 DIAGNOSIS — T8189XD Other complications of procedures, not elsewhere classified, subsequent encounter: Secondary | ICD-10-CM

## 2022-01-28 DIAGNOSIS — C220 Liver cell carcinoma: Secondary | ICD-10-CM

## 2022-01-28 DIAGNOSIS — S76111D Strain of right quadriceps muscle, fascia and tendon, subsequent encounter: Secondary | ICD-10-CM | POA: Diagnosis not present

## 2022-01-28 DIAGNOSIS — S81801A Unspecified open wound, right lower leg, initial encounter: Secondary | ICD-10-CM

## 2022-01-28 MED ORDER — OXYCODONE-ACETAMINOPHEN 10-325 MG PO TABS: 1.0000 | ORAL_TABLET | ORAL | 0 refills | Status: DC | PRN

## 2022-01-28 NOTE — Progress Notes (Signed)
FOLLOW UP   LV 01/14/22    Encounter Diagnoses  Name Primary?   Non-healing surgical wound, subsequent encounter Yes   Cellulitis, wound, post-operative    Rupture of right quadriceps tendon, subsequent encounter repair 09/04/21      Chief Complaint  Patient presents with   Follow-up    Recheck on right leg, DOS 11-02-21.     Mr. Danny [name] fell slipped on the floor did not disrupt his wounds his wounds actually look good he is got 4 1 over the knee where the surgery was 1 at his great toe that looks red and erythematous and he is on antibiotics got 1 at the distal lateral tibia and then he is got 1 sort of mid leg.  The other 3 look good  I am not sure about his quadriceps repair  Recommend continued wound care, antibiotics  Meds ordered this encounter  Medications   oxyCODONE-acetaminophen (PERCOCET) 10-325 MG tablet    Sig: Take 1 tablet by mouth every 4 (four) hours as needed for pain.    Dispense:  30 tablet    Refill:  0

## 2022-01-28 NOTE — Therapy (Signed)
[place], [place], [address] Phone: [phone]   Fax:  [phone]  Wound Care Therapy  Patient Details  Name: Danny [name] MRN: [id number] Date of Birth: May 06, 1966 Referring Provider (PT): Arther Abbott MD   Encounter Date: 01/28/2022   PT End of Session - 01/28/22 1731     Visit Number 17    Number of Visits 24    Date for PT Re-Evaluation 02/13/22    Authorization Type Medicaid - [employer].  Requested additional visits 01/01/22    Authorization Time Period [employer] approved 12 visits from 11/21/2021-03/21/2022 then an additional 12 visits from 4/21 thru 7/13( 24 visits approved from 11/21/2021-7/13    Authorization - Visit Number 9    Authorization - Number of Visits 12    Progress Note Due on Visit 12    PT Start Time 1620    PT Stop Time 1700    PT Time Calculation (min) 40 min    Activity Tolerance Patient tolerated treatment well    Behavior During Therapy [facility] for tasks assessed/performed             Past Medical History:  Diagnosis Date   Alcohol abuse    Anxiety    Back pain    CVA (cerebral infarction)    Depression    Diabetes mellitus    Hypertension    Migraines    Stroke [facility])     Past Surgical History:  Procedure Laterality Date   CAST APPLICATION Right 8/54/6270   Procedure: CAST APPLICATION;  Surgeon: Carole Civil, MD;  Location: [facility];  Service: Orthopedics;  Laterality: Right;   FOOT SURGERY     INCISION AND DRAINAGE Right 11/02/2021   Procedure: INCISION AND DRAINAGE RIGHT KNEE;  Surgeon: Carole Civil, MD;  Location: [facility];  Service: Orthopedics;  Laterality: Right;   IR 3D INDEPENDENT WKST  10/11/2021   IR ANGIOGRAM SELECTIVE EACH ADDITIONAL VESSEL  10/11/2021   IR ANGIOGRAM SELECTIVE EACH ADDITIONAL VESSEL  10/11/2021   IR ANGIOGRAM SELECTIVE EACH ADDITIONAL VESSEL  10/11/2021   IR ANGIOGRAM SELECTIVE EACH ADDITIONAL VESSEL  10/11/2021   IR ANGIOGRAM  SELECTIVE EACH ADDITIONAL VESSEL  10/11/2021   IR ANGIOGRAM SELECTIVE EACH ADDITIONAL VESSEL  10/11/2021   IR ANGIOGRAM SELECTIVE EACH ADDITIONAL VESSEL  10/25/2021   IR ANGIOGRAM SELECTIVE EACH ADDITIONAL VESSEL  10/25/2021   IR ANGIOGRAM VISCERAL SELECTIVE  10/11/2021   IR EMBO TUMOR ORGAN ISCHEMIA INFARCT INC GUIDE ROADMAPPING  10/25/2021   IR EMBO TUMOR ORGAN ISCHEMIA INFARCT INC GUIDE ROADMAPPING  10/25/2021   IR RADIOLOGIST EVAL & MGMT  08/23/2021   IR RADIOLOGIST EVAL & MGMT  11/21/2021   IR RADIOLOGIST EVAL & MGMT  01/24/2022   IR US GUIDE VASC ACCESS RIGHT  10/11/2021   IR US GUIDE VASC ACCESS RIGHT  10/25/2021   MANDIBLE SURGERY     QUADRICEPS TENDON REPAIR Right 09/04/2021   Procedure: REPAIR QUADRICEP TENDON;  Surgeon: Carole Civil, MD;  Location: [facility];  Service: Orthopedics;  Laterality: Right;   TENDON EXPLORATION Right 11/02/2021   Procedure: QUADRICEPS TENDON AND PATELLAR TENDON EXPLORATION;  Surgeon: Carole Civil, MD;  Location: [facility];  Service: Orthopedics;  Laterality: Right;    There were no vitals filed for this visit.               Wound Therapy - 01/28/22 1731     Subjective pt states he took a bad fall  over the weekend.  Went to Dr Althia Forts office this am to make sure everything was okay  STates he is sore and asking about when he can start strengthening    Patient and Family Stated Goals wound to heal    Date of Onset 09/04/21    Prior Treatments dressing changes at MD office    Pain Scale 0-10    Pain Score 0-No pain    Evaluation and Treatment Procedures Explained to Patient/Family Yes    Evaluation and Treatment Procedures agreed to    Wound Properties Date First Assessed: 12/04/21 Time First Assessed: 1010 Location: Pretibial Location Orientation: Proximal;Right Present on Admission: -- , unsure    Dressing Type Gauze (Comment)   medihoney, vaseline perimeter, 2x2, medipore tape   Dressing Changed Changed    Dressing Status Old drainage     Dressing Change Frequency PRN    Site / Wound Assessment Dry;Yellow    % Wound base Red or Granulating 50%    % Wound base Yellow/Fibrinous Exudate 50%    Peri-wound Assessment Erythema (blanchable)    Margins Unattached edges (unapproximated)    Drainage Amount Minimal    Drainage Description Serosanguineous    Treatment Cleansed;Debridement (Selective)    Wound Properties Date First Assessed: 12/04/21 Time First Assessed: 1010 Location: Ankle Location Orientation: Right;Lateral Present on Admission: No   Dressing Type Gauze (Comment)   vaseline, medihoney, 2x2, tape   Dressing Changed Changed    Dressing Status Old drainage    Dressing Change Frequency PRN    Site / Wound Assessment Dry;Brown;Yellow    % Wound base Red or Granulating 60%    % Wound base Yellow/Fibrinous Exudate 40%    Peri-wound Assessment Erythema (blanchable)    Margins Attached edges (approximated)    Drainage Amount Scant    Drainage Description Serous    Treatment Cleansed;Debridement (Selective)    Wound Properties Date First Assessed: 12/04/21 Location: Toe (Comment  which one) Location Orientation: Anterior;Right Wound Description (Comments): Great Toe Present on Admission: No   Dressing Type Gauze (Comment)   vaseline, medihoney, gauze and coban, netting   Dressing Changed Changed    Dressing Status Intact    Dressing Change Frequency PRN    Site / Wound Assessment Pink;Yellow    % Wound base Red or Granulating 30%    % Wound base Yellow/Fibrinous Exudate 70%    Peri-wound Assessment Erythema (blanchable)    Margins Unattached edges (unapproximated)    Drainage Amount Scant    Drainage Description Serosanguineous    Treatment Cleansed;Debridement (Selective)    Wound Properties Date First Assessed: 11/21/21 Time First Assessed: 1130 Wound Type: Incision - Dehisced Location: Knee Location Orientation: Anterior;Right Present on Admission: Yes   Dressing Type Bismuth petroleum    Dressing Changed Changed     Dressing Status Old drainage    Dressing Change Frequency PRN    Site / Wound Assessment Red;Yellow    % Wound base Red or Granulating 100%    Margins Epibole (rolled edges)    Drainage Amount Scant    Drainage Description Serous    Treatment Cleansed;Debridement (Selective)    Selective Debridement - Location Wound beds and periwounds    Selective Debridement - Tools Used Forceps;Scalpel    Selective Debridement - Tissue Removed slough    Wound Therapy - Clinical Statement Knee wound with film of slough easily removed, shin wound continues to be worst with possible depth.  Able to remove more slough from perimeter and packed  honey gauze dressing down into inferior border.  Lateral ankle improved following debridement and also the great toe.  Changed dressing to xeroform for lateral ankle but continued with honey on toe.  Instructed with quad sets and AA LAQ with neice Valerie.  To discuss beginning strenghteninging with evaluating therapist.    Wound Therapy - Functional Problem List gait, bathing, dressing    Factors Delaying/Impairing Wound Healing Diabetes Mellitus;Immobility;Multiple medical problems;Tobacco use    Hydrotherapy Plan Debridement;Dressing change;Electrical stimulation;Patient/family education;Pulsatile lavage with suction    Wound Therapy - Frequency 2X / week    Wound Therapy - Current Recommendations PT    Wound Plan Continue with debridement and dressing changes.  measure and photograph weekly.    Dressing  Rt knee: xeroform, 4X4, 1/2" foam, medipore tape    Dressing Great toe medihoney, 2x2, 2" conform, 1" coban and #1 netting    Dressing Theotis [place]rio, 2X2, medipore tape    Manual Therapy Lateral ankle: xerform, 2X2, medipore                       PT Short Term Goals - 01/10/22 1725       PT SHORT TERM GOAL #1   Title Patient wound will be free from slough to demonstrate wound healing.    Period Weeks    Status On-going                PT Long Term Goals - 01/14/22 1503       PT LONG TERM GOAL #1   Title Patient wound to be healed to reduce risk of infection.    Status On-going      PT LONG TERM GOAL #2   Title Paitent will be able to transition to formal PT for knee rehab to improve functional mobility and strength.    Status On-going      PT LONG TERM GOAL #3   Title PT to be walking with least assistive device    Time 3    Status New                    Patient will benefit from skilled therapeutic intervention in order to improve the following deficits and impairments:     Visit Diagnosis: Cellulitis, wound, post-operative  Other abnormalities of gait and mobility  Multiple open wounds of right lower extremity, initial encounter     Problem List Patient Active Problem List   Diagnosis Date Noted   Wound infection after surgery 09/27/2021   Postoperative wound cellulitis 09/26/2021   Rupture of right quadriceps tendon    Chronic hepatitis C without hepatic coma ([place]) 08/08/2021   Hepatocellular carcinoma ([place]) 08/08/2021   COPD (chronic obstructive pulmonary disease) ([place]) 08/08/2021   Hypertension 08/08/2021   Other cirrhosis of liver ([place]) 08/08/2021   Type 2 diabetes mellitus ([place]) 08/08/2021   Generalized abdominal pain 06/06/2021   Dysphagia 06/06/2021   Gastroesophageal reflux disease 06/06/2021   Abnormal liver ultrasound 06/06/2021   Colon cancer screening 06/06/2021   Elevated LFTs 06/06/2021   GREATER TROCHANTERIC BURSITIS 07/03/2007   INCREASED BLOOD PRESSURE 07/03/2007   BACK PAIN, LUMBAR 05/29/2007   NUMBNESS 05/29/2007   INSOMNIA, CHRONIC 05/13/2007   HYPERLIPIDEMIA 01/13/2007   ANXIETY DEPRESSION 01/13/2007   SYNDROME, CHRONIC PAIN 01/13/2007   CVA 01/13/2007   ARTHRITIS 01/13/2007   Teena Irani, PTA/CLT [place] Ph: [phone]  Teena Irani, PTA 01/28/2022, 5:38 PM  [place] [address] [place], [place], [address] Phone: [phone]   Fax:  [phone]  Name: Danny [name] MRN: [id number] Date of Birth: 09/05/1966

## 2022-01-31 ENCOUNTER — Ambulatory Visit (HOSPITAL_COMMUNITY): Payer: Medicaid Other | Admitting: Physical Therapy

## 2022-01-31 DIAGNOSIS — R2689 Other abnormalities of gait and mobility: Secondary | ICD-10-CM

## 2022-01-31 DIAGNOSIS — M6281 Muscle weakness (generalized): Secondary | ICD-10-CM

## 2022-01-31 DIAGNOSIS — T8149XA Infection following a procedure, other surgical site, initial encounter: Secondary | ICD-10-CM | POA: Diagnosis not present

## 2022-01-31 DIAGNOSIS — S81801A Unspecified open wound, right lower leg, initial encounter: Secondary | ICD-10-CM

## 2022-01-31 NOTE — Therapy (Addendum)
[place], [place], [address] Phone: [phone]   Fax:  [phone]  Wound Care Therapy  Patient Details  Name: Danny [name] MRN: [id number] Date of Birth: December 05, 1965 Referring Provider (PT): Arther Abbott MD   Encounter Date: 01/31/2022   PT End of Session - 01/31/22 1627     Visit Number 18    Number of Visits 32    Date for PT Re-Evaluation 02/28/22    Authorization Type Medicaid - [employer].  Requested additional visits 01/31/22    Authorization Time Period [employer] approved 12 visits from 11/21/2021-03/21/2022 then an additional 12 visits from 4/21 thru 7/13; 8 visits requested 01/31/22-02/28/22 - Check auth   Authorization - Visit Number 10    Authorization - Number of Visits 12    Progress Note Due on Visit 12    PT Start Time 1318    PT Stop Time 1414    PT Time Calculation (min) 56 min    Activity Tolerance Patient tolerated treatment well    Behavior During Therapy [facility] for tasks assessed/performed             Past Medical History:  Diagnosis Date   Alcohol abuse    Anxiety    Back pain    CVA (cerebral infarction)    Depression    Diabetes mellitus    Hypertension    Migraines    Stroke [facility])     Past Surgical History:  Procedure Laterality Date   CAST APPLICATION Right 8/58/8502   Procedure: CAST APPLICATION;  Surgeon: Carole Civil, MD;  Location: [facility];  Service: Orthopedics;  Laterality: Right;   FOOT SURGERY     INCISION AND DRAINAGE Right 11/02/2021   Procedure: INCISION AND DRAINAGE RIGHT KNEE;  Surgeon: Carole Civil, MD;  Location: [facility] ORS;  Service: Orthopedics;  Laterality: Right;   IR 3D INDEPENDENT WKST  10/11/2021   IR ANGIOGRAM SELECTIVE EACH ADDITIONAL VESSEL  10/11/2021   IR ANGIOGRAM SELECTIVE EACH ADDITIONAL VESSEL  10/11/2021   IR ANGIOGRAM SELECTIVE EACH ADDITIONAL VESSEL  10/11/2021   IR ANGIOGRAM SELECTIVE EACH ADDITIONAL VESSEL  10/11/2021   IR  ANGIOGRAM SELECTIVE EACH ADDITIONAL VESSEL  10/11/2021   IR ANGIOGRAM SELECTIVE EACH ADDITIONAL VESSEL  10/11/2021   IR ANGIOGRAM SELECTIVE EACH ADDITIONAL VESSEL  10/25/2021   IR ANGIOGRAM SELECTIVE EACH ADDITIONAL VESSEL  10/25/2021   IR ANGIOGRAM VISCERAL SELECTIVE  10/11/2021   IR EMBO TUMOR ORGAN ISCHEMIA INFARCT INC GUIDE ROADMAPPING  10/25/2021   IR EMBO TUMOR ORGAN ISCHEMIA INFARCT INC GUIDE ROADMAPPING  10/25/2021   IR RADIOLOGIST EVAL & MGMT  08/23/2021   IR RADIOLOGIST EVAL & MGMT  11/21/2021   IR RADIOLOGIST EVAL & MGMT  01/24/2022   IR US GUIDE VASC ACCESS RIGHT  10/11/2021   IR US GUIDE VASC ACCESS RIGHT  10/25/2021   MANDIBLE SURGERY     QUADRICEPS TENDON REPAIR Right 09/04/2021   Procedure: REPAIR QUADRICEP TENDON;  Surgeon: Carole Civil, MD;  Location: [facility];  Service: Orthopedics;  Laterality: Right;   TENDON EXPLORATION Right 11/02/2021   Procedure: QUADRICEPS TENDON AND PATELLAR TENDON EXPLORATION;  Surgeon: Carole Civil, MD;  Location: [facility];  Service: Orthopedics;  Laterality: Right;         [facility] PT Assessment - 01/31/22 1806       Assessment   Medical Diagnosis R knee wound    Referring Provider (PT) Arther Abbott MD  Observation/Other Assessments   Focus on Therapeutic Outcomes (FOTO)  39.4% functional status      AROM   Right Knee Extension -5    Right Knee Flexion 120    Left Knee Extension 0    Left Knee Flexion 140      Strength   Right Hip Flexion 4/5    Right Hip Extension 3+/5    Right Hip ABduction 3+/5    Left Hip Flexion 4+/5    Left Hip Extension 3+/5    Left Hip ABduction 4-/5    Right Knee Flexion 4+/5    Right Knee Extension 2-/5    Left Knee Flexion 4/5    Left Knee Extension 3+/5                     Wound Therapy - 01/31/22 1628     Subjective pt states the front of his leg is awful sore today.  States he is still sore on his elbow and rest of the body too from his fall.  Therapist spoke with evaluating  therapist and need to begin strengthening in light of recent coupld of falls and pt unable to complete knee extension with Rt LE against gravity.    Patient and Family Stated Goals wound to heal    Date of Onset 09/04/21    Prior Treatments dressing changes at MD office    Pain Scale 0-10    Pain Score 0-No pain    Evaluation and Treatment Procedures Explained to Patient/Family Yes    Evaluation and Treatment Procedures agreed to    Wound Properties Date First Assessed: 12/04/21 Time First Assessed: 1010 Location: Pretibial Location Orientation: Proximal;Right Present on Admission: -- , unsure    Wound Image Images linked: 1    Dressing Type Gauze (Comment)   medihoney, vaseline perimeter, 2x2, medipore tape   Dressing Changed Changed    Dressing Status Old drainage    Dressing Change Frequency PRN    Site / Wound Assessment Dry;Yellow    % Wound base Red or Granulating 40%    % Wound base Yellow/Fibrinous Exudate 60%    Peri-wound Assessment Erythema (blanchable)   1.5cm halo around wound   Wound Length (cm) 3 cm    Wound Width (cm) 2 cm    Wound Depth (cm) --   undermining from 6-10 o'clock   Wound Surface Area (cm^2) 6 cm^2    Undermining (cm) 0.5cm at 9 o'clock, 0.3cm at 6 o'clock    Margins Unattached edges (unapproximated)    Drainage Amount Minimal    Drainage Description Purulent;Green;No odor    Treatment Cleansed;Debridement (Selective)    Wound Properties Date First Assessed: 12/04/21 Time First Assessed: 1010 Location: Ankle Location Orientation: Right;Lateral Present on Admission: No   Wound Image Images linked: 1    Dressing Type Gauze (Comment)   vaseline, medihoney, 2x2, tape   Dressing Changed Changed    Dressing Status Old drainage    Dressing Change Frequency PRN    Site / Wound Assessment Dry;Brown;Yellow    % Wound base Red or Granulating 60%    % Wound base Yellow/Fibrinous Exudate 40%    Peri-wound Assessment Erythema (blanchable)    Wound Length (cm) 0.5  cm    Wound Width (cm) 0.2 cm    Wound Surface Area (cm^2) 0.1 cm^2    Margins Attached edges (approximated)    Drainage Amount Scant    Drainage Description Serous    Treatment Cleansed;Debridement (Selective)  Wound Properties Date First Assessed: 12/04/21 Location: Toe (Comment  which one) Location Orientation: Anterior;Right Wound Description (Comments): Great Toe Present on Admission: No   Wound Image Images linked: 1    Dressing Type Gauze (Comment)   vaseline, medihoney, gauze and coban, netting   Dressing Changed Changed    Dressing Status Intact    Dressing Change Frequency PRN    Site / Wound Assessment Pink;Yellow    % Wound base Red or Granulating 20%    % Wound base Yellow/Fibrinous Exudate 30%    Peri-wound Assessment Erythema (blanchable)    Wound Length (cm) 0.5 cm    Wound Width (cm) 0.8 cm    Wound Depth (cm) 0.2 cm    Wound Volume (cm^3) 0.08 cm^3    Wound Surface Area (cm^2) 0.4 cm^2    Undermining (cm) drainage coming from medial border of nail and medial side of toe today    Margins Unattached edges (unapproximated)    Drainage Amount Minimal    Drainage Description Serosanguineous    Treatment Cleansed;Debridement (Selective)    Wound Properties Date First Assessed: 11/21/21 Time First Assessed: 1130 Wound Type: Incision - Dehisced Location: Knee Location Orientation: Anterior;Right Present on Admission: Yes   Wound Image Images linked: 1    Dressing Type Bismuth petroleum    Dressing Changed Changed    Dressing Status Old drainage    Dressing Change Frequency PRN    Site / Wound Assessment Red;Yellow    % Wound base Red or Granulating 100%    Wound Length (cm) 2.6 cm    Wound Width (cm) 1.8 cm    Wound Depth (cm) --   hypergranulated   Wound Surface Area (cm^2) 4.68 cm^2    Margins Epibole (rolled edges)    Drainage Amount Scant    Drainage Description Serosanguineous    Treatment Cleansed;Debridement (Selective)    Selective Debridement - Location  Wound beds and periwounds    Selective Debridement - Tools Used Forceps;Scalpel    Selective Debridement - Tissue Removed slough    Wound Therapy - Clinical Statement Wounds measured and photographed this session with improvement noted in knee and lateral ankle.  Increased drainage and size of anterior shin and great toe with signs/symptoms positive for infection.  Great toe with drainage locations at medial great toe and medial side of great toe in addition to wound location anteriorly.  Both locations with increase erythema and greenish drainage present.   Evaluating therapist notified MD of change as may need different antibiotic or culture of these wounds.  Pt reports he has been on an antibiotic for a while now for his knee. ROM and strength assesment completed today for bil LE's.  Pt with general weakness bilaterally but most weakness in bil extensors and Rt quadricep mm.  Pt will benefit from addition of LE strenghtening to increase safety and ability to ambulate without AD.    Wound Therapy - Functional Problem List gait, bathing, dressing    Factors Delaying/Impairing Wound Healing Diabetes Mellitus;Immobility;Multiple medical problems;Tobacco use    Hydrotherapy Plan Debridement;Dressing change;Electrical stimulation;Patient/family education;Pulsatile lavage with suction    Wound Therapy - Frequency 2X / week    Wound Therapy - Current Recommendations PT    Wound Plan Continue with debridement and dressing changes.  measure and photograph weekly.  Begin LE strengthening exericses.  Estim may be helpful to increase quad strength in conjunction with active strengthening.    Dressing  Rt knee: xeroform, 4X4, 1/2" foam, medipore tape  Dressing Great toe medihoney, 2x2, 2" conform, 1" coban and #1 netting    Dressing shin: Medihoney packed into wound, 2X2, 1/2" foam over with medipore tape for compression    Manual Therapy Lateral ankle: xerform, 2X2, medipore             Assessment:  Patient with significant RLE weakness limiting gait and mobility. New goals added for improving strength in order to improve patient's mobility which will likely improve overall health and fucntion to assist in wound healing. Objective measures reveal strength/ROM deficits and significant activity and participation restrictions. Extending POC to continue to improve functional deficit and promote wound healing. Patient will continue to benefit from physical therapy in order to improve function and reduce impairment.  8:49 AM, 02/05/22 Mearl Latin PT, DPT Physical Therapist at [facility]         PT Short Term Goals - 01/10/22 1725       PT SHORT TERM GOAL #1   Title Patient wound will be free from slough to demonstrate wound healing.    Period Weeks    Status On-going               PT Long Term Goals - 01/14/22 1503       PT LONG TERM GOAL #1   Title Patient wound to be healed to reduce risk of infection.    Status On-going      PT LONG TERM GOAL #2   Title Paitent will be able to transition to formal PT for knee rehab to improve functional mobility and strength.    Status On-going      PT LONG TERM GOAL #3   Title PT to be walking with least assistive device    Time 3    Status New           Long term goal#4 Patient will demonstrate at least 4/5 R knee extension for improve gait mechanics. Target Date 02/28/22         Patient will benefit from skilled therapeutic intervention in order to improve the following deficits and impairments:    Visit Diagnosis: Cellulitis, wound, post-operative  Multiple open wounds of right lower extremity, initial encounter  Other abnormalities of gait and mobility  Muscle Weakness   Problem List Patient Active Problem List   Diagnosis Date Noted   Wound infection after surgery 09/27/2021   Postoperative wound cellulitis 09/26/2021   Rupture of right quadriceps tendon    Chronic  hepatitis C without hepatic coma ([place]) 08/08/2021   Hepatocellular carcinoma ([place]) 08/08/2021   COPD (chronic obstructive pulmonary disease) ([place]) 08/08/2021   Hypertension 08/08/2021   Other cirrhosis of liver ([place]) 08/08/2021   Type 2 diabetes mellitus ([place]) 08/08/2021   Generalized abdominal pain 06/06/2021   Dysphagia 06/06/2021   Gastroesophageal reflux disease 06/06/2021   Abnormal liver ultrasound 06/06/2021   Colon cancer screening 06/06/2021   Elevated LFTs 06/06/2021   GREATER TROCHANTERIC BURSITIS 07/03/2007   INCREASED BLOOD PRESSURE 07/03/2007   BACK PAIN, LUMBAR 05/29/2007   NUMBNESS 05/29/2007   INSOMNIA, CHRONIC 05/13/2007   HYPERLIPIDEMIA 01/13/2007   ANXIETY DEPRESSION 01/13/2007   SYNDROME, CHRONIC PAIN 01/13/2007   CVA 01/13/2007   ARTHRITIS 01/13/2007   Teena Irani, PTA/CLT [place] Ph: [phone]  Veva Holes 01/31/2022, 6:07 PM  [facility] [address] [place], [place], [address] Phone: [phone]   Fax:  [phone]  Name: Danny [name] MRN: [id number] Date of Birth: 1966-05-09

## 2022-02-01 ENCOUNTER — Encounter: Payer: Self-pay | Admitting: Orthopedic Surgery

## 2022-02-01 ENCOUNTER — Other Ambulatory Visit: Payer: Self-pay | Admitting: Orthopedic Surgery

## 2022-02-01 DIAGNOSIS — T8189XD Other complications of procedures, not elsewhere classified, subsequent encounter: Secondary | ICD-10-CM

## 2022-02-01 MED ORDER — OXYCODONE-ACETAMINOPHEN 10-325 MG PO TABS: 1.0000 | ORAL_TABLET | ORAL | 0 refills | Status: DC | PRN

## 2022-02-05 ENCOUNTER — Encounter (HOSPITAL_COMMUNITY): Payer: Self-pay

## 2022-02-05 ENCOUNTER — Ambulatory Visit (HOSPITAL_COMMUNITY): Payer: Medicaid Other | Attending: Orthopedic Surgery

## 2022-02-05 DIAGNOSIS — L0390 Cellulitis, unspecified: Secondary | ICD-10-CM | POA: Insufficient documentation

## 2022-02-05 DIAGNOSIS — S81801A Unspecified open wound, right lower leg, initial encounter: Secondary | ICD-10-CM

## 2022-02-05 DIAGNOSIS — M6281 Muscle weakness (generalized): Secondary | ICD-10-CM | POA: Insufficient documentation

## 2022-02-05 DIAGNOSIS — R269 Unspecified abnormalities of gait and mobility: Secondary | ICD-10-CM | POA: Insufficient documentation

## 2022-02-05 DIAGNOSIS — T8149XA Infection following a procedure, other surgical site, initial encounter: Secondary | ICD-10-CM

## 2022-02-05 DIAGNOSIS — R2689 Other abnormalities of gait and mobility: Secondary | ICD-10-CM

## 2022-02-05 NOTE — Therapy (Signed)
[place], Danny [name], [address] Phone: [phone]   Fax:  [phone]  Wound Care Therapy  Patient Details  Name: Danny [name] MRN: [id number] Date of Birth: 06-20-1966 Referring Provider (PT): Arther Abbott MD   Encounter Date: 02/05/2022   PT End of Session - 02/05/22 1003     Visit Number 19    Number of Visits 32    Date for PT Re-Evaluation 02/28/22    Authorization Type Medicaid - [employer].  Requested additional visits 01/31/22    Authorization Time Period [employer] approved 12 visits from 11/21/2021-03/21/2022 then an additional 12 visits from 4/21 thru 7/13; 8 visits requested 01/31/22-02/28/22 - Check auth    PT Start Time 6962    PT Stop Time 1000    PT Time Calculation (min) 88 min    Activity Tolerance Patient tolerated treatment well    Behavior During Therapy [facility] for tasks assessed/performed             Past Medical History:  Diagnosis Date   Alcohol abuse    Anxiety    Back pain    CVA (cerebral infarction)    Depression    Diabetes mellitus    Hypertension    Migraines    Stroke [facility])     Past Surgical History:  Procedure Laterality Date   CAST APPLICATION Right 9/52/8413   Procedure: CAST APPLICATION;  Surgeon: Carole Civil, MD;  Location: [facility];  Service: Orthopedics;  Laterality: Right;   FOOT SURGERY     INCISION AND DRAINAGE Right 11/02/2021   Procedure: INCISION AND DRAINAGE RIGHT KNEE;  Surgeon: Carole Civil, MD;  Location: [facility];  Service: Orthopedics;  Laterality: Right;   IR 3D INDEPENDENT WKST  10/11/2021   IR ANGIOGRAM SELECTIVE EACH ADDITIONAL VESSEL  10/11/2021   IR ANGIOGRAM SELECTIVE EACH ADDITIONAL VESSEL  10/11/2021   IR ANGIOGRAM SELECTIVE EACH ADDITIONAL VESSEL  10/11/2021   IR ANGIOGRAM SELECTIVE EACH ADDITIONAL VESSEL  10/11/2021   IR ANGIOGRAM SELECTIVE EACH ADDITIONAL VESSEL  10/11/2021   IR ANGIOGRAM SELECTIVE EACH ADDITIONAL VESSEL  10/11/2021   IR  ANGIOGRAM SELECTIVE EACH ADDITIONAL VESSEL  10/25/2021   IR ANGIOGRAM SELECTIVE EACH ADDITIONAL VESSEL  10/25/2021   IR ANGIOGRAM VISCERAL SELECTIVE  10/11/2021   IR EMBO TUMOR ORGAN ISCHEMIA INFARCT INC GUIDE ROADMAPPING  10/25/2021   IR EMBO TUMOR ORGAN ISCHEMIA INFARCT INC GUIDE ROADMAPPING  10/25/2021   IR RADIOLOGIST EVAL & MGMT  08/23/2021   IR RADIOLOGIST EVAL & MGMT  11/21/2021   IR RADIOLOGIST EVAL & MGMT  01/24/2022   IR US GUIDE VASC ACCESS RIGHT  10/11/2021   IR US GUIDE VASC ACCESS RIGHT  10/25/2021   MANDIBLE SURGERY     QUADRICEPS TENDON REPAIR Right 09/04/2021   Procedure: REPAIR QUADRICEP TENDON;  Surgeon: Carole Civil, MD;  Location: [facility];  Service: Orthopedics;  Laterality: Right;   TENDON EXPLORATION Right 11/02/2021   Procedure: QUADRICEPS TENDON AND PATELLAR TENDON EXPLORATION;  Surgeon: Carole Civil, MD;  Location: [facility];  Service: Orthopedics;  Laterality: Right;    There were no vitals filed for this visit.    Subjective Assessment - 02/05/22 1021     Subjective Pt stated shin pain 4/10, stated antibiotics end mid weak.  Pt stated he feels his shin looks and feels worse.    Currently in Pain? Yes    Pain Score 4     Pain Location Leg  Pain Orientation Right    Pain Descriptors / Indicators Sore    Pain Type Surgical pain              Therex: Supine:  3 sets 10 reps quad sets, hold 5-10"  Heel slide with quad set extension (cueing to reduce ER)  AAROM SAQ 10x 3" holds  Marching 10x alteranting  SLR unable Sidelying: abduction 10x Prone: hip extension (cueing to keep knee straight STS elevated height 5x          Wound Therapy - 02/05/22 1021     Subjective Pt stated shin pain 4/10, stated antibiotics end mid weak.  Pt stated he feels his shin looks and feels worse.    Patient and Family Stated Goals wound to heal    Date of Onset 09/04/21    Prior Treatments dressing changes at MD office    Pain Scale 0-10    Evaluation and  Treatment Procedures Explained to Patient/Family Yes    Evaluation and Treatment Procedures agreed to    Wound Properties Date First Assessed: 12/07/21 Wound Type: Other (Comment) Location: Ankle Location Orientation: Posterior Wound Description (Comments): wound superior posterior ankle Present on Admission: No   Wound Image --    Dressing Type Bismuth petroleum    Dressing Changed --    Dressing Status Old drainage    Dressing Change Frequency PRN    Site / Wound Assessment Granulation tissue    Peri-wound Assessment Erythema (blanchable)    Margins Attached edges (approximated)    Drainage Amount None    Treatment Cleansed    Wound Properties Date First Assessed: 12/04/21 Time First Assessed: 1010 Location: Pretibial Location Orientation: Proximal;Right Present on Admission: -- , unsure    Wound Image Images linked: 1    Dressing Type --   medipore, vaseline perimeter, 2x2, medipore tape   Dressing Changed Changed    Dressing Status Old drainage    Dressing Change Frequency PRN    Site / Wound Assessment Dry;Yellow    % Wound base Red or Granulating 40%    % Wound base Yellow/Fibrinous Exudate 60%    Peri-wound Assessment Erythema (blanchable)    Undermining (cm) .4 from 3o'clock to 9 o'clock    Margins Unattached edges (unapproximated)    Drainage Amount Minimal    Drainage Description Purulent;Green    Treatment Debridement (Selective);Cleansed    Wound Properties Date First Assessed: 12/04/21 Location: Toe (Comment  which one) Location Orientation: Anterior;Right Wound Description (Comments): Great Toe Present on Admission: No   Wound Image Images linked: 1    Dressing Type Gauze (Comment)    Dressing Changed Changed    Dressing Status Intact    Dressing Change Frequency PRN    Site / Wound Assessment Pink;Yellow    % Wound base Red or Granulating 30%    % Wound base Yellow/Fibrinous Exudate 70%    Peri-wound Assessment Erythema (blanchable)    Undermining (cm) drainage  coming from medial toe line    Margins Unattached edges (unapproximated)    Drainage Amount Minimal    Drainage Description Serosanguineous    Treatment Cleansed;Debridement (Selective)    Wound Properties Date First Assessed: 11/21/21 Time First Assessed: 1130 Wound Type: Incision - Dehisced Location: Knee Location Orientation: Anterior;Right Present on Admission: Yes   Wound Image Images linked: 1    Dressing Type Bismuth petroleum    Dressing Changed Changed    Dressing Status Old drainage    Dressing Change Frequency PRN  Site / Wound Assessment Red;Yellow    % Wound base Red or Granulating 100%    Margins Epibole (rolled edges)    Drainage Amount Scant    Drainage Description Serosanguineous    Treatment Cleansed;Debridement (Selective)    Wound Properties Date First Assessed: 12/04/21 Time First Assessed: 1010 Location: Ankle Location Orientation: Right;Lateral Present on Admission: No   Dressing Type Gauze (Comment)   vaseline, medihoney, 2x2, medipore tape   Dressing Changed Changed    Dressing Status Old drainage    Dressing Change Frequency PRN    Site / Wound Assessment Dry;Brown;Yellow    % Wound base Red or Granulating 60%    % Wound base Yellow/Fibrinous Exudate 40%    Peri-wound Assessment Erythema (blanchable)    Margins Attached edges (approximated)    Drainage Amount Scant    Drainage Description Serous    Treatment Cleansed;Debridement (Selective)    Selective Debridement - Location Wound beds and periwounds    Selective Debridement - Tools Used Forceps;Scalpel    Selective Debridement - Tissue Removed slough    Wound Therapy - Clinical Statement MD messaged concerning shin wound, replied stated culture complete in April with no infection and is going to refer to vascular specialist.  No redness outside of purple line drawn previous.  Shin wound appears deeper with undermining .4cm from 3 o'clock to 9 o'clock.  Knee wound progressing well with good granulation  tissues, noted wound hypergranulated, continued with foam to address.  Added therex this session.  Pt presents with extreme quad weakness required verbal and tactile cueing to reduce gluteal compensation.  Pt unable to complete SAQ without assistance.  Encouraged to complete 100 quad sets daily and increase hold time to 10", tendency to fatigue at 3" holds.    Wound Therapy - Functional Problem List gait, bathing, dressing    Factors Delaying/Impairing Wound Healing Diabetes Mellitus;Immobility;Multiple medical problems;Tobacco use    Hydrotherapy Plan Debridement;Dressing change;Electrical stimulation;Patient/family education;Pulsatile lavage with suction    Wound Therapy - Frequency 2X / week    Wound Therapy - Current Recommendations PT    Wound Plan Continue with debridement and dressing changes.  measure and photograph weekly.  Begin LE strengthening exericses.  Estim may be helpful to increase quad strength in conjunction with active strengthening.    Dressing  Rt knee: xeroform, 4X4, 1/2" foam, medipore tape    Dressing Great toe medihoney, 2x2, 2" conform, 1" coban and #1 netting    Dressing shin: medihoney, vaseline perimeter, foam, and medipre tape   shin: medihoney, vaseline perimeter, foam, and medipre tape   Manual Therapy Lateral ankle: xerform, 2X2, medipore                       PT Short Term Goals - 01/10/22 1725       PT SHORT TERM GOAL #1   Title Patient wound will be free from slough to demonstrate wound healing.    Period Weeks    Status On-going               PT Long Term Goals - 02/05/22 0843       PT LONG TERM GOAL #1   Title Patient wound to be healed to reduce risk of infection.    Status On-going      PT LONG TERM GOAL #2   Title Paitent will be able to transition to formal PT for knee rehab to improve functional mobility and strength.    Status  On-going      PT LONG TERM GOAL #3   Title PT to be walking with least assistive device     Time 3    Status New      PT LONG TERM GOAL #4   Title Patient will demonstrate at least 4/5 R knee extension for improve gait mechanics.    Time 4    Period Weeks    Status New    Target Date 02/28/22                    Patient will benefit from skilled therapeutic intervention in order to improve the following deficits and impairments:     Visit Diagnosis: Cellulitis, wound, post-operative  Multiple open wounds of right lower extremity, initial encounter  Muscle weakness (generalized)  Other abnormalities of gait and mobility     Problem List Patient Active Problem List   Diagnosis Date Noted   Wound infection after surgery 09/27/2021   Postoperative wound cellulitis 09/26/2021   Rupture of right quadriceps tendon    Chronic hepatitis C without hepatic coma ([place]) 08/08/2021   Hepatocellular carcinoma ([place]) 08/08/2021   COPD (chronic obstructive pulmonary disease) ([place]) 08/08/2021   Hypertension 08/08/2021   Other cirrhosis of liver ([place]) 08/08/2021   Type 2 diabetes mellitus ([place]) 08/08/2021   Generalized abdominal pain 06/06/2021   Dysphagia 06/06/2021   Gastroesophageal reflux disease 06/06/2021   Abnormal liver ultrasound 06/06/2021   Colon cancer screening 06/06/2021   Elevated LFTs 06/06/2021   GREATER TROCHANTERIC BURSITIS 07/03/2007   INCREASED BLOOD PRESSURE 07/03/2007   BACK PAIN, LUMBAR 05/29/2007   NUMBNESS 05/29/2007   INSOMNIA, CHRONIC 05/13/2007   HYPERLIPIDEMIA 01/13/2007   ANXIETY DEPRESSION 01/13/2007   SYNDROME, CHRONIC PAIN 01/13/2007   CVA 01/13/2007   ARTHRITIS 01/13/2007   Danny [name], Danny [name]  Danny [name], PTA 02/05/2022, 10:54 AM  Danny [name], Danny [name], Danny [name] Phone: [phone]   Fax:  [phone]  Name: Danny [name] MRN: [id number] Date of Birth: 1966/01/22

## 2022-02-05 NOTE — Addendum Note (Signed)
Addended by: Mearl Latin on: 02/05/2022 08:52 AM   Modules accepted: Orders

## 2022-02-07 ENCOUNTER — Other Ambulatory Visit: Payer: Self-pay | Admitting: Orthopedic Surgery

## 2022-02-07 ENCOUNTER — Ambulatory Visit (HOSPITAL_COMMUNITY): Payer: Medicaid Other | Attending: Orthopedic Surgery

## 2022-02-07 DIAGNOSIS — M6281 Muscle weakness (generalized): Secondary | ICD-10-CM | POA: Insufficient documentation

## 2022-02-07 DIAGNOSIS — R2689 Other abnormalities of gait and mobility: Secondary | ICD-10-CM | POA: Insufficient documentation

## 2022-02-07 DIAGNOSIS — S81801A Unspecified open wound, right lower leg, initial encounter: Secondary | ICD-10-CM | POA: Insufficient documentation

## 2022-02-07 DIAGNOSIS — T8189XD Other complications of procedures, not elsewhere classified, subsequent encounter: Secondary | ICD-10-CM

## 2022-02-07 DIAGNOSIS — T8149XA Infection following a procedure, other surgical site, initial encounter: Secondary | ICD-10-CM | POA: Insufficient documentation

## 2022-02-07 MED ORDER — OXYCODONE-ACETAMINOPHEN 10-325 MG PO TABS: 1.0000 | ORAL_TABLET | ORAL | 0 refills | Status: DC | PRN

## 2022-02-07 NOTE — Progress Notes (Signed)
Pt.'s niece arrived to clinic stated pt does not feel well and wishes to cancel apt for today.  Encouraged to take to ER if begins to feels worse.   Ihor Austin, LPTA/CLT; Delana Meyer [phone]

## 2022-02-07 NOTE — Therapy (Signed)
Pt.'s niece arrived to clinic stated pt does not feel well and wishes to cancel apt for today.  Encouraged to take to ER if begins to feels worse.     Ihor Austin, LPTA/CLT; Delana Meyer [phone]

## 2022-02-11 ENCOUNTER — Ambulatory Visit (HOSPITAL_COMMUNITY): Payer: Medicaid Other | Attending: Orthopedic Surgery | Admitting: Physical Therapy

## 2022-02-11 ENCOUNTER — Other Ambulatory Visit: Payer: Self-pay | Admitting: Orthopedic Surgery

## 2022-02-11 DIAGNOSIS — T8149XA Infection following a procedure, other surgical site, initial encounter: Secondary | ICD-10-CM | POA: Diagnosis present

## 2022-02-11 DIAGNOSIS — M6281 Muscle weakness (generalized): Secondary | ICD-10-CM | POA: Insufficient documentation

## 2022-02-11 DIAGNOSIS — S81801A Unspecified open wound, right lower leg, initial encounter: Secondary | ICD-10-CM | POA: Insufficient documentation

## 2022-02-11 DIAGNOSIS — T8189XD Other complications of procedures, not elsewhere classified, subsequent encounter: Secondary | ICD-10-CM

## 2022-02-11 DIAGNOSIS — R2689 Other abnormalities of gait and mobility: Secondary | ICD-10-CM | POA: Diagnosis present

## 2022-02-11 MED ORDER — OXYCODONE-ACETAMINOPHEN 10-325 MG PO TABS: 1.0000 | ORAL_TABLET | ORAL | 0 refills | Status: DC | PRN

## 2022-02-11 NOTE — Therapy (Signed)
[place], [place], [address] Phone: [phone]   Fax:  [phone]  Wound Care Therapy  Patient Details  Name: Danny [name] MRN: [id number] Date of Birth: Aug 17, 1966 Referring Provider (PT): Arther Abbott MD   Encounter Date: 02/11/2022   PT End of Session - 02/11/22 0930     Visit Number 20    Number of Visits 32    Date for PT Re-Evaluation 02/28/22    Authorization Type Medicaid - [employer].  Requested additional visits 01/31/22    Authorization Time Period [employer] approved 12 visits from 11/21/2021-03/21/2022 then an additional 12 visits from 4/21 thru 7/13; 8 visits requested 01/31/22-02/28/22 - 8 visits approved 3/15-7/13    Authorization - Visit Number 1    Authorization - Number of Visits 8    Progress Note Due on Visit 42    PT Start Time 0833    PT Stop Time 0950    PT Time Calculation (min) 77 min    Activity Tolerance Patient tolerated treatment well    Behavior During Therapy [facility] for tasks assessed/performed             Past Medical History:  Diagnosis Date   Alcohol abuse    Anxiety    Back pain    CVA (cerebral infarction)    Depression    Diabetes mellitus    Hypertension    Migraines    Stroke [facility])     Past Surgical History:  Procedure Laterality Date   CAST APPLICATION Right 5/95/6387   Procedure: CAST APPLICATION;  Surgeon: Carole Civil, MD;  Location: [facility];  Service: Orthopedics;  Laterality: Right;   FOOT SURGERY     INCISION AND DRAINAGE Right 11/02/2021   Procedure: INCISION AND DRAINAGE RIGHT KNEE;  Surgeon: Carole Civil, MD;  Location: [facility] ORS;  Service: Orthopedics;  Laterality: Right;   IR 3D INDEPENDENT WKST  10/11/2021   IR ANGIOGRAM SELECTIVE EACH ADDITIONAL VESSEL  10/11/2021   IR ANGIOGRAM SELECTIVE EACH ADDITIONAL VESSEL  10/11/2021   IR ANGIOGRAM SELECTIVE EACH ADDITIONAL VESSEL  10/11/2021   IR ANGIOGRAM SELECTIVE EACH ADDITIONAL VESSEL  10/11/2021    IR ANGIOGRAM SELECTIVE EACH ADDITIONAL VESSEL  10/11/2021   IR ANGIOGRAM SELECTIVE EACH ADDITIONAL VESSEL  10/11/2021   IR ANGIOGRAM SELECTIVE EACH ADDITIONAL VESSEL  10/25/2021   IR ANGIOGRAM SELECTIVE EACH ADDITIONAL VESSEL  10/25/2021   IR ANGIOGRAM VISCERAL SELECTIVE  10/11/2021   IR EMBO TUMOR ORGAN ISCHEMIA INFARCT INC GUIDE ROADMAPPING  10/25/2021   IR EMBO TUMOR ORGAN ISCHEMIA INFARCT INC GUIDE ROADMAPPING  10/25/2021   IR RADIOLOGIST EVAL & MGMT  08/23/2021   IR RADIOLOGIST EVAL & MGMT  11/21/2021   IR RADIOLOGIST EVAL & MGMT  01/24/2022   IR US GUIDE VASC ACCESS RIGHT  10/11/2021   IR US GUIDE VASC ACCESS RIGHT  10/25/2021   MANDIBLE SURGERY     QUADRICEPS TENDON REPAIR Right 09/04/2021   Procedure: REPAIR QUADRICEP TENDON;  Surgeon: Carole Civil, MD;  Location: [facility];  Service: Orthopedics;  Laterality: Right;   TENDON EXPLORATION Right 11/02/2021   Procedure: QUADRICEPS TENDON AND PATELLAR TENDON EXPLORATION;  Surgeon: Carole Civil, MD;  Location: [facility];  Service: Orthopedics;  Laterality: Right;    There were no vitals filed for this visit.     Therex: 02/11/22 Supine:  3 sets 10 reps quad sets, hold 5-10"  Heel slide with quad set extension (cueing to reduce ER)             AAROM SAQ 10x 3" holds  Bridge 2X10 Sidelying: abduction 10x Prone: hip extension (cueing to keep knee straight) Standing: STS elevated height 10X no UE assist cues to shift weight to Rt  Heelraises 15X            Wound Therapy - 02/11/22 0937     Subjective Pt states the soreness is less at his Rt shin.  States he fell over the weekend when he was getting up to use the bathroom.  States he only scraped his Rt elbow    Patient and Family Stated Goals wound to heal    Date of Onset 09/04/21    Prior Treatments dressing changes at MD office    Pain Scale 0-10    Pain Score 7     Pain Type Surgical pain    Pain Location Leg    Pain Orientation Right    Pain Descriptors /  Indicators Sore    Evaluation and Treatment Procedures Explained to Patient/Family Yes    Evaluation and Treatment Procedures agreed to    Wound Properties Date First Assessed: 12/04/21 Time First Assessed: 1010 Location: Pretibial Location Orientation: Proximal;Right Present on Admission: -- , unsure    Dressing Type Gauze (Comment)   medihoney gel   Dressing Changed Changed    Dressing Status Old drainage    Dressing Change Frequency PRN    Site / Wound Assessment Dry;Yellow    % Wound base Red or Granulating 50%    % Wound base Yellow/Fibrinous Exudate 50%    Peri-wound Assessment Erythema (blanchable)    Margins Unattached edges (unapproximated)    Drainage Amount Minimal    Drainage Description Serosanguineous    Treatment Cleansed;Debridement (Selective)    Wound Properties Date First Assessed: 12/04/21 Time First Assessed: 1010 Location: Ankle Location Orientation: Right;Lateral Present on Admission: No   Dressing Type Gauze (Comment)   vaseline, medihoney, 2x2, medipore tape   Dressing Changed Changed    Dressing Status Old drainage    Dressing Change Frequency PRN    Site / Wound Assessment Dry;Brown;Yellow    % Wound base Red or Granulating 90%    % Wound base Yellow/Fibrinous Exudate 10%    Peri-wound Assessment Erythema (blanchable)    Margins Attached edges (approximated)    Drainage Amount None    Treatment Cleansed;Debridement (Selective)    Wound Properties Date First Assessed: 12/04/21 Location: Toe (Comment  which one) Location Orientation: Anterior;Right Wound Description (Comments): Great Toe Present on Admission: No   Dressing Type Gauze (Comment)    Dressing Changed Changed    Dressing Status Intact    Dressing Change Frequency PRN    Site / Wound Assessment Pink;Yellow    % Wound base Red or Granulating 40%    % Wound base Yellow/Fibrinous Exudate 60%    Peri-wound Assessment Erythema (blanchable)    Undermining (cm) no medial toe drainage, debrided tissue  from medial and lateral toe    Margins Unattached edges (unapproximated)    Drainage Amount Minimal    Drainage Description Serosanguineous    Treatment Cleansed;Debridement (Selective)    Wound Properties Date First Assessed: 11/21/21 Time First Assessed: 1130 Wound Type: Incision - Dehisced Location: Knee Location Orientation: Anterior;Right Present on Admission: Yes   Dressing Type Bismuth petroleum    Dressing Changed Changed    Dressing Status Old drainage    Dressing  Change Frequency PRN    Site / Wound Assessment Red;Yellow    % Wound base Red or Granulating 100%    Margins Epibole (rolled edges)    Drainage Amount Scant    Drainage Description Serosanguineous    Treatment Cleansed;Debridement (Selective)    Selective Debridement - Location Wound beds and periwounds    Selective Debridement - Tools Used Forceps;Scalpel    Selective Debridement - Tissue Removed slough    Wound Therapy - Clinical Statement Wounds actually improved this session with less signs and symptoms of infection in great toe and Rt shin.  Less redness around wounds as well.  Cleansed and debrided well prior to redressing using the same dressings as last visit.    Wound Therapy - Functional Problem List gait, bathing, dressing    Factors Delaying/Impairing Wound Healing Diabetes Mellitus;Immobility;Multiple medical problems;Tobacco use    Hydrotherapy Plan Debridement;Dressing change;Electrical stimulation;Patient/family education;Pulsatile lavage with suction    Wound Therapy - Frequency 2X / week    Wound Therapy - Current Recommendations PT    Wound Plan Continue with debridement and dressing changes.  measure and photograph weekly.  Continue LE strengthening exericses.  Estim may be helpful to increase quad strength in conjunction with active strengthening.    Dressing  Rt knee: xeroform, 4X4, 1/2" foam, medipore tape    Dressing Great toe medihoney, 2x2, 2" conform, 1" coban and #1 netting    Manual Therapy  Lateral ankle: xerform, 2X2, medipore                       PT Short Term Goals - 01/10/22 1725       PT SHORT TERM GOAL #1   Title Patient wound will be free from slough to demonstrate wound healing.    Period Weeks    Status On-going               PT Long Term Goals - 02/05/22 0843       PT LONG TERM GOAL #1   Title Patient wound to be healed to reduce risk of infection.    Status On-going      PT LONG TERM GOAL #2   Title Paitent will be able to transition to formal PT for knee rehab to improve functional mobility and strength.    Status On-going      PT LONG TERM GOAL #3   Title PT to be walking with least assistive device    Time 3    Status New      PT LONG TERM GOAL #4   Title Patient will demonstrate at least 4/5 R knee extension for improve gait mechanics.    Time 4    Period Weeks    Status New    Target Date 02/28/22                    Patient will benefit from skilled therapeutic intervention in order to improve the following deficits and impairments:     Visit Diagnosis: No diagnosis found.     Problem List Patient Active Problem List   Diagnosis Date Noted   Wound infection after surgery 09/27/2021   Postoperative wound cellulitis 09/26/2021   Rupture of right quadriceps tendon    Chronic hepatitis C without hepatic coma ([place]) 08/08/2021   Hepatocellular carcinoma ([place]) 08/08/2021   COPD (chronic obstructive pulmonary disease) ([place]) 08/08/2021   Hypertension 08/08/2021   Other cirrhosis of liver ([place]) 08/08/2021   Type  2 diabetes mellitus ([employer] [place]) 08/08/2021   Generalized abdominal pain 06/06/2021   Dysphagia 06/06/2021   Gastroesophageal reflux disease 06/06/2021   Abnormal liver ultrasound 06/06/2021   Colon cancer screening 06/06/2021   Elevated LFTs 06/06/2021   GREATER TROCHANTERIC BURSITIS 07/03/2007   INCREASED BLOOD PRESSURE 07/03/2007   BACK PAIN, LUMBAR 05/29/2007   NUMBNESS 05/29/2007   INSOMNIA,  CHRONIC 05/13/2007   HYPERLIPIDEMIA 01/13/2007   ANXIETY DEPRESSION 01/13/2007   SYNDROME, CHRONIC PAIN 01/13/2007   CVA 01/13/2007   ARTHRITIS 01/13/2007   Teena Irani, PTA/CLT Lane Ph: [phone]  Veva Holes 02/11/2022, 9:47 AM  [place] [address] [place], [place], [address] Phone: [phone]   Fax:  [phone]  Name: JAKEEM GRAPE MRN: [id number] Date of Birth: [birth year]

## 2022-02-14 ENCOUNTER — Encounter (HOSPITAL_COMMUNITY): Payer: Self-pay

## 2022-02-14 ENCOUNTER — Encounter (HOSPITAL_COMMUNITY): Payer: Self-pay | Admitting: Physical Therapy

## 2022-02-14 ENCOUNTER — Ambulatory Visit (HOSPITAL_COMMUNITY): Payer: Medicaid Other

## 2022-02-14 DIAGNOSIS — S81801A Unspecified open wound, right lower leg, initial encounter: Secondary | ICD-10-CM

## 2022-02-14 DIAGNOSIS — T8149XA Infection following a procedure, other surgical site, initial encounter: Secondary | ICD-10-CM | POA: Diagnosis not present

## 2022-02-14 DIAGNOSIS — M6281 Muscle weakness (generalized): Secondary | ICD-10-CM

## 2022-02-14 DIAGNOSIS — R2689 Other abnormalities of gait and mobility: Secondary | ICD-10-CM

## 2022-02-14 NOTE — Therapy (Signed)
[place], [place], [address] Phone: [phone]   Fax:  [phone]  Wound Care Therapy  Patient Details  Name: Danny [name] MRN: [id number] Date of Birth: 1966-07-04 Referring Provider (PT): Arther Abbott MD   Encounter Date: 02/14/2022   PT End of Session - 02/14/22 1013     Visit Number 21    Number of Visits 32    Date for PT Re-Evaluation 02/28/22    Authorization Type Medicaid - [employer].  Requested additional visits 01/31/22    Authorization Time Period [employer] approved 12 visits from 11/21/2021-03/21/2022 then an additional 12 visits from 4/21 thru 7/13; 8 visits requested 01/31/22-02/28/22 - 8 visits approved 3/15-7/13    Authorization - Visit Number 4    Authorization - Number of Visits 8    Progress Note Due on Visit 36    PT Start Time 0835    PT Stop Time 0955    PT Time Calculation (min) 80 min    Activity Tolerance Patient tolerated treatment well    Behavior During Therapy [facility] for tasks assessed/performed             Past Medical History:  Diagnosis Date   Alcohol abuse    Anxiety    Back pain    CVA (cerebral infarction)    Depression    Diabetes mellitus    Hypertension    Migraines    Stroke [facility])     Past Surgical History:  Procedure Laterality Date   CAST APPLICATION Right 9/47/6546   Procedure: CAST APPLICATION;  Surgeon: Carole Civil, MD;  Location: [facility];  Service: Orthopedics;  Laterality: Right;   FOOT SURGERY     INCISION AND DRAINAGE Right 11/02/2021   Procedure: INCISION AND DRAINAGE RIGHT KNEE;  Surgeon: Carole Civil, MD;  Location: [facility];  Service: Orthopedics;  Laterality: Right;   IR 3D INDEPENDENT WKST  10/11/2021   IR ANGIOGRAM SELECTIVE EACH ADDITIONAL VESSEL  10/11/2021   IR ANGIOGRAM SELECTIVE EACH ADDITIONAL VESSEL  10/11/2021   IR ANGIOGRAM SELECTIVE EACH ADDITIONAL VESSEL  10/11/2021   IR ANGIOGRAM SELECTIVE EACH ADDITIONAL VESSEL  10/11/2021    IR ANGIOGRAM SELECTIVE EACH ADDITIONAL VESSEL  10/11/2021   IR ANGIOGRAM SELECTIVE EACH ADDITIONAL VESSEL  10/11/2021   IR ANGIOGRAM SELECTIVE EACH ADDITIONAL VESSEL  10/25/2021   IR ANGIOGRAM SELECTIVE EACH ADDITIONAL VESSEL  10/25/2021   IR ANGIOGRAM VISCERAL SELECTIVE  10/11/2021   IR EMBO TUMOR ORGAN ISCHEMIA INFARCT INC GUIDE ROADMAPPING  10/25/2021   IR EMBO TUMOR ORGAN ISCHEMIA INFARCT INC GUIDE ROADMAPPING  10/25/2021   IR RADIOLOGIST EVAL & MGMT  08/23/2021   IR RADIOLOGIST EVAL & MGMT  11/21/2021   IR RADIOLOGIST EVAL & MGMT  01/24/2022   IR US GUIDE VASC ACCESS RIGHT  10/11/2021   IR US GUIDE VASC ACCESS RIGHT  10/25/2021   MANDIBLE SURGERY     QUADRICEPS TENDON REPAIR Right 09/04/2021   Procedure: REPAIR QUADRICEP TENDON;  Surgeon: Carole Civil, MD;  Location: [facility];  Service: Orthopedics;  Laterality: Right;   TENDON EXPLORATION Right 11/02/2021   Procedure: QUADRICEPS TENDON AND PATELLAR TENDON EXPLORATION;  Surgeon: Carole Civil, MD;  Location: [facility];  Service: Orthopedics;  Laterality: Right;    There were no vitals filed for this visit.    Subjective Assessment - 02/14/22 0948     Subjective Pt happy withwound progress, no reports of pain today.  Reports compliance wiht HEP  daily.    Currently in Pain? No/denies               Therex: Supine: quad sets 10x 3 sets 5-10" holds  Heel slide 10x  AAROM SAQ Seated: LAQ Standing: heel raises  TKE RTB 10x 2 sets 5" holds        Wound Therapy - 02/14/22 0001     Subjective Pt happy withwound progress, no reports of pain today.  Reports compliance wiht HEP daily.    Patient and Family Stated Goals wound to heal    Date of Onset 09/04/21    Prior Treatments dressing changes at MD office    Pain Scale 0-10    Pain Score 0-No pain    Evaluation and Treatment Procedures Explained to Patient/Family Yes    Evaluation and Treatment Procedures agreed to    Wound Properties Date First Assessed: 12/04/21 Time  First Assessed: 1010 Location: Pretibial Location Orientation: Proximal;Right Present on Admission: -- , unsure    Wound Image Images linked: 1    Dressing Type Gauze (Comment)    Dressing Changed Changed    Dressing Status Old drainage    Dressing Change Frequency PRN    Site / Wound Assessment Dry;Yellow    % Wound base Red or Granulating 50%    % Wound base Yellow/Fibrinous Exudate 50%    Peri-wound Assessment Erythema (blanchable)    Wound Length (cm) 2.8 cm    Wound Width (cm) 2.3 cm    Wound Surface Area (cm^2) 6.44 cm^2    Margins Unattached edges (unapproximated)    Drainage Amount Minimal    Drainage Description Serosanguineous    Treatment Cleansed;Debridement (Selective)    Wound Properties Date First Assessed: 12/04/21 Time First Assessed: 1010 Location: Ankle Location Orientation: Right;Lateral Present on Admission: No   Wound Image Images linked: 1    Dressing Type Gauze (Comment)    Dressing Changed Changed    Dressing Status Old drainage    Dressing Change Frequency PRN    Site / Wound Assessment Dry;Brown;Yellow    % Wound base Red or Granulating 90%    % Wound base Yellow/Fibrinous Exudate 10%    Peri-wound Assessment Erythema (blanchable)    Wound Length (cm) 0.8 cm    Wound Width (cm) 0.4 cm    Wound Surface Area (cm^2) 0.32 cm^2    Margins Attached edges (approximated)    Drainage Amount None    Treatment Cleansed;Debridement (Selective)    Wound Properties Date First Assessed: 12/04/21 Location: Toe (Comment  which one) Location Orientation: Anterior;Right Wound Description (Comments): Great Toe Present on Admission: No   Wound Image Images linked: 1    Dressing Type Gauze (Comment)    Dressing Changed Changed    Dressing Status Intact    Dressing Change Frequency PRN    Site / Wound Assessment Pink;Yellow    % Wound base Red or Granulating 40%    % Wound base Yellow/Fibrinous Exudate 60%    Peri-wound Assessment Erythema (blanchable)    Wound Length  (cm) 0.5 cm    Wound Width (cm) 0.5 cm    Wound Depth (cm) 0.2 cm    Wound Volume (cm^3) 0.05 cm^3    Wound Surface Area (cm^2) 0.25 cm^2    Margins Unattached edges (unapproximated)    Drainage Amount Minimal    Drainage Description Serosanguineous    Wound Properties Date First Assessed: 11/21/21 Time First Assessed: 1130 Wound Type: Incision - Dehisced Location: Knee Location Orientation: Anterior;Right  Present on Admission: Yes   Wound Image Images linked: 1    Dressing Type Bismuth petroleum    Dressing Changed Changed    Dressing Status Old drainage    Dressing Change Frequency PRN    Site / Wound Assessment Red;Yellow    % Wound base Red or Granulating 100%    Wound Length (cm) 1.7 cm    Wound Width (cm) 1.3 cm    Wound Surface Area (cm^2) 2.21 cm^2    Margins Epibole (rolled edges)    Drainage Amount Scant    Drainage Description Serosanguineous    Treatment Cleansed;Debridement (Selective)    Selective Debridement - Location Wound beds and periwounds    Selective Debridement - Tools Used Forceps;Scalpel    Selective Debridement - Tissue Removed slough    Wound Therapy - Clinical Statement Knee wound pregression well towards healing, measured today with vast reduction in size and granulation tissue.  Noted thick black hair removed from medial toe nail line, no drainage present.  Rt shin continues to have adherent slough and some redness perimeter, has not grown any.  Continued wiht same dressings as last session.  Pt continues to demonstrate extreme weakness Rt quadriceps, trial wiht muscle gun to awaken musculature with no increased contraction.  Did not attempt estim as pt active cancer treatments.  Pt able to demonstrate good isometric quad contraction though unable to complete SAQ and LAQ with AAROM.  Added standing TKE with good contraction noticed.    Wound Therapy - Functional Problem List gait, bathing, dressing    Factors Delaying/Impairing Wound Healing Diabetes  Mellitus;Immobility;Multiple medical problems;Tobacco use    Hydrotherapy Plan Debridement;Dressing change;Electrical stimulation;Patient/family education;Pulsatile lavage with suction    Wound Therapy - Frequency 2X / week    Wound Therapy - Current Recommendations PT    Wound Plan Continue with debridement and dressing changes.  measure and photograph weekly.  Continue LE strengthening exericses.  Estim may be helpful to increase quad strength in conjunction with active strengthening.    Dressing  Rt knee: xeroform, 4X4, 1/2" foam, medipore tape    Dressing Great toe medihoney, 2x2, 2" conform, 1" coban and #1 netting    Dressing Shin: medihoney, medipore tape, gauze    Manual Therapy Lateral ankle: xerform, 2X2, medipore                       PT Short Term Goals - 01/10/22 1725       PT SHORT TERM GOAL #1   Title Patient wound will be free from slough to demonstrate wound healing.    Period Weeks    Status On-going               PT Long Term Goals - 02/05/22 0843       PT LONG TERM GOAL #1   Title Patient wound to be healed to reduce risk of infection.    Status On-going      PT LONG TERM GOAL #2   Title Paitent will be able to transition to formal PT for knee rehab to improve functional mobility and strength.    Status On-going      PT LONG TERM GOAL #3   Title PT to be walking with least assistive device    Time 3    Status New      PT LONG TERM GOAL #4   Title Patient will demonstrate at least 4/5 R knee extension for improve gait mechanics.  Time 4    Period Weeks    Status New    Target Date 02/28/22                    Patient will benefit from skilled therapeutic intervention in order to improve the following deficits and impairments:     Visit Diagnosis: Cellulitis, wound, post-operative  Multiple open wounds of right lower extremity, initial encounter  Other abnormalities of gait and mobility  Muscle weakness  (generalized)     Problem List Patient Active Problem List   Diagnosis Date Noted   Wound infection after surgery 09/27/2021   Postoperative wound cellulitis 09/26/2021   Rupture of right quadriceps tendon    Chronic hepatitis C without hepatic coma ([place]) 08/08/2021   Hepatocellular carcinoma ([place]) 08/08/2021   COPD (chronic obstructive pulmonary disease) ([place]) 08/08/2021   Hypertension 08/08/2021   Other cirrhosis of liver ([place]) 08/08/2021   Type 2 diabetes mellitus ([place]) 08/08/2021   Generalized abdominal pain 06/06/2021   Dysphagia 06/06/2021   Gastroesophageal reflux disease 06/06/2021   Abnormal liver ultrasound 06/06/2021   Colon cancer screening 06/06/2021   Elevated LFTs 06/06/2021   GREATER TROCHANTERIC BURSITIS 07/03/2007   INCREASED BLOOD PRESSURE 07/03/2007   BACK PAIN, LUMBAR 05/29/2007   NUMBNESS 05/29/2007   INSOMNIA, CHRONIC 05/13/2007   HYPERLIPIDEMIA 01/13/2007   ANXIETY DEPRESSION 01/13/2007   SYNDROME, CHRONIC PAIN 01/13/2007   CVA 01/13/2007   ARTHRITIS 01/13/2007   Ihor Austin, LPTA/CLT; [facility] [phone]  Aldona Lento, PTA 02/14/2022, 10:18 AM  [place], [place], [address] Phone: [phone]   Fax:  [phone]  Name: LOCHLANN MASTRANGELO MRN: [id number] Date of Birth: Dec 27, 1965

## 2022-02-16 ENCOUNTER — Other Ambulatory Visit: Payer: Self-pay | Admitting: Orthopedic Surgery

## 2022-02-16 DIAGNOSIS — T8189XD Other complications of procedures, not elsewhere classified, subsequent encounter: Secondary | ICD-10-CM

## 2022-02-18 ENCOUNTER — Encounter: Payer: Self-pay | Admitting: Orthopedic Surgery

## 2022-02-18 ENCOUNTER — Ambulatory Visit (INDEPENDENT_AMBULATORY_CARE_PROVIDER_SITE_OTHER): Payer: Medicaid Other | Admitting: Orthopedic Surgery

## 2022-02-18 DIAGNOSIS — S76111D Strain of right quadriceps muscle, fascia and tendon, subsequent encounter: Secondary | ICD-10-CM

## 2022-02-18 DIAGNOSIS — T8189XD Other complications of procedures, not elsewhere classified, subsequent encounter: Secondary | ICD-10-CM | POA: Diagnosis not present

## 2022-02-18 MED ORDER — OXYCODONE-ACETAMINOPHEN 10-325 MG PO TABS: 1.0000 | ORAL_TABLET | ORAL | 0 refills | Status: DC | PRN

## 2022-02-18 NOTE — Progress Notes (Signed)
Chief Complaint  Patient presents with   Follow-up    Recheck on right knee, DOS 11-02-21.   Encounter Diagnoses  Name Primary?   Non-healing surgical wound, subsequent encounter Yes   Rupture of right quadriceps tendon, subsequent encounter repair 09/04/21    56 year old male status post exploration of right quadriceps tendon takedown and repair back in December 2022 he developed a nonhealing right surgical wound over the right knee and then in the postop period developed a toe wound on the right a posterior lateral ankle wound on the right and a tibial wound on the right which was unexplained.  The posterolateral ankle wound may have been brought on by the cast that he wore postop but the toe wound and the tibial wound are unexplained  He is an avid smoker.  He has been asked to stop.  He says he is cut back.  He was on opioid therapy preop and is now on chronic opioid therapy for chronic pain in his right leg requiring oxycodone  His surgical wound is approximately 4 mm long by 3 mm wide is superficial epidermis only good granulation bed noted  The mid tibial wound is a little bit larger a little bit deeper though not down to bone  The great toe wound was not evaluated today   Although he is is improving his surgical wound these other wounds are most likely from poor arterial flow and I would like him to be evaluated by the vascular surgery service  In the meantime he will continue his antibiotics  Continue his pain medication and wound care  Follow-up 4 to 5 weeks

## 2022-02-18 NOTE — Patient Instructions (Signed)
CONTINUE WOUND CARE   ANTIBIOTICS   CHRONIC PAIN MEDS   BRACE

## 2022-02-19 ENCOUNTER — Ambulatory Visit (HOSPITAL_COMMUNITY): Payer: Medicaid Other | Admitting: Physical Therapy

## 2022-02-19 ENCOUNTER — Telehealth (HOSPITAL_COMMUNITY): Payer: Self-pay | Admitting: Physical Therapy

## 2022-02-19 NOTE — Telephone Encounter (Signed)
Pt did not show for his appointment.  Called and spoke to patient who states he did not get a reminder call.  Reminded of next appt on Thursday and will print out an additional schedule for patient for reference.    Teena Irani, PTA/CLT [place] Ph: [phone]

## 2022-02-21 ENCOUNTER — Ambulatory Visit (HOSPITAL_COMMUNITY): Payer: Medicaid Other | Attending: Internal Medicine | Admitting: Physical Therapy

## 2022-02-21 DIAGNOSIS — Y939 Activity, unspecified: Secondary | ICD-10-CM | POA: Insufficient documentation

## 2022-02-21 DIAGNOSIS — M6281 Muscle weakness (generalized): Secondary | ICD-10-CM

## 2022-02-21 DIAGNOSIS — T8149XA Infection following a procedure, other surgical site, initial encounter: Secondary | ICD-10-CM

## 2022-02-21 DIAGNOSIS — R2689 Other abnormalities of gait and mobility: Secondary | ICD-10-CM | POA: Insufficient documentation

## 2022-02-21 DIAGNOSIS — S81801A Unspecified open wound, right lower leg, initial encounter: Secondary | ICD-10-CM

## 2022-02-21 NOTE — Therapy (Signed)
[place], [place], [address] Phone: [phone]   Fax:  [phone]  Wound Care Therapy  Patient Details  Name: Danny [name] MRN: [id number] Date of Birth: July 16, 1966 Referring Provider (PT): Arther Abbott MD   Encounter Date: 02/21/2022   PT End of Session - 02/21/22 0929     Visit Number 22    Number of Visits 32    Date for PT Re-Evaluation 02/28/22    Authorization Type Medicaid - [employer].  Requested additional visits 01/31/22    Authorization Time Period [employer] approved 12 visits from 11/21/2021-03/21/2022 then an additional 12 visits from 4/21 thru 7/13; 8 visits requested 01/31/22-02/28/22 - 8 visits approved 3/15-7/13    Authorization - Visit Number 5    Authorization - Number of Visits 8    Progress Note Due on Visit 30    PT Start Time 0835    PT Stop Time 0955    PT Time Calculation (min) 80 min    Activity Tolerance Patient tolerated treatment well    Behavior During Therapy [facility] for tasks assessed/performed             Past Medical History:  Diagnosis Date   Alcohol abuse    Anxiety    Back pain    CVA (cerebral infarction)    Depression    Diabetes mellitus    Hypertension    Migraines    Stroke [facility])     Past Surgical History:  Procedure Laterality Date   CAST APPLICATION Right 1/94/1740   Procedure: CAST APPLICATION;  Surgeon: Carole Civil, MD;  Location: [facility];  Service: Orthopedics;  Laterality: Right;   FOOT SURGERY     INCISION AND DRAINAGE Right 11/02/2021   Procedure: INCISION AND DRAINAGE RIGHT KNEE;  Surgeon: Carole Civil, MD;  Location: [facility] OR[facility];  Service: Orthopedics;  Laterality: Right;   IR 3D INDEPENDENT WKST  10/11/2021   IR ANGIOGRAM SELECTIVE EACH ADDITIONAL VESSEL  10/11/2021   IR ANGIOGRAM SELECTIVE EACH ADDITIONAL VESSEL  10/11/2021   IR ANGIOGRAM SELECTIVE EACH ADDITIONAL VESSEL  10/11/2021   IR ANGIOGRAM SELECTIVE EACH ADDITIONAL VESSEL   10/11/2021   IR ANGIOGRAM SELECTIVE EACH ADDITIONAL VESSEL  10/11/2021   IR ANGIOGRAM SELECTIVE EACH ADDITIONAL VESSEL  10/11/2021   IR ANGIOGRAM SELECTIVE EACH ADDITIONAL VESSEL  10/25/2021   IR ANGIOGRAM SELECTIVE EACH ADDITIONAL VESSEL  10/25/2021   IR ANGIOGRAM VISCERAL SELECTIVE  10/11/2021   IR EMBO TUMOR ORGAN ISCHEMIA INFARCT INC GUIDE ROADMAPPING  10/25/2021   IR EMBO TUMOR ORGAN ISCHEMIA INFARCT INC GUIDE ROADMAPPING  10/25/2021   IR RADIOLOGIST EVAL & MGMT  08/23/2021   IR RADIOLOGIST EVAL & MGMT  11/21/2021   IR RADIOLOGIST EVAL & MGMT  01/24/2022   IR US GUIDE VASC ACCESS RIGHT  10/11/2021   IR US GUIDE VASC ACCESS RIGHT  10/25/2021   MANDIBLE SURGERY     QUADRICEPS TENDON REPAIR Right 09/04/2021   Procedure: REPAIR QUADRICEP TENDON;  Surgeon: Carole Civil, MD;  Location: [facility];  Service: Orthopedics;  Laterality: Right;   TENDON EXPLORATION Right 11/02/2021   Procedure: QUADRICEPS TENDON AND PATELLAR TENDON EXPLORATION;  Surgeon: Carole Civil, MD;  Location: [facility];  Service: Orthopedics;  Laterality: Right;    There were no vitals filed for this visit.  Therex: 02/21/22 Supine:  3 sets 10 reps quad sets, hold 5-10"             Heel slide  with quad set extension (cueing to reduce ER) 15X cues not to use other LE to assist             AAROM SAQ 10x             Bridge 2X10 Seated:  LAQ AAROM into extension and eccentric lowering with AAROM 10X Prone: hip extension (cueing to keep knee straight) Standing: STS elevated height 10X no UE assist cues to shift weight to Rt             TKE with RTB 15X5"            Wound Therapy - 02/21/22 0935     Subjective pt reports he has had no falls, being compliant with HEP.    Patient and Family Stated Goals wound to heal    Date of Onset 09/04/21    Prior Treatments dressing changes at MD office    Pain Scale 0-10    Pain Score 0-No pain    Evaluation and Treatment Procedures Explained to Patient/Family Yes    Evaluation  and Treatment Procedures agreed to    Wound Properties Date First Assessed: 12/04/21 Time First Assessed: 1010 Location: Pretibial Location Orientation: Proximal;Right Present on Admission: -- , unsure    Wound Image Images linked: 1    Dressing Type Gauze (Comment)    Dressing Changed Changed    Dressing Status Old drainage    Dressing Change Frequency PRN    Site / Wound Assessment Dry;Yellow    % Wound base Red or Granulating 75%    % Wound base Yellow/Fibrinous Exudate 25%    Peri-wound Assessment Erythema (blanchable)    Wound Length (cm) 2.6 cm    Wound Width (cm) 2.3 cm    Wound Surface Area (cm^2) 5.98 cm^2    Undermining (cm) at 7 o'clock    Margins Unattached edges (unapproximated)    Drainage Amount Minimal    Drainage Description Serosanguineous    Treatment Cleansed;Debridement (Selective)    Wound Properties Date First Assessed: 12/04/21 Time First Assessed: 1010 Location: Ankle Location Orientation: Right;Lateral Present on Admission: No   Wound Image Images linked: 1    Dressing Type Gauze (Comment)    Dressing Status Old drainage    Dressing Change Frequency PRN    Site / Wound Assessment Dry;Brown;Yellow    % Wound base Red or Granulating 100%    Peri-wound Assessment Erythema (blanchable)    Wound Length (cm) 0.2 cm    Wound Width (cm) 0.2 cm    Wound Surface Area (cm^2) 0.04 cm^2    Margins Attached edges (approximated)    Drainage Amount None    Treatment Cleansed;Debridement (Selective)    Wound Properties Date First Assessed: 12/04/21 Location: Toe (Comment  which one) Location Orientation: Anterior;Right Wound Description (Comments): Great Toe Present on Admission: No   Dressing Type Gauze (Comment)    Dressing Changed Changed    Dressing Status Intact    Dressing Change Frequency PRN    Site / Wound Assessment Pink;Yellow    % Wound base Red or Granulating 50%    % Wound base Yellow/Fibrinous Exudate 50%    Peri-wound Assessment Erythema (blanchable)     Wound Length (cm) 0.4 cm    Wound Width (cm) 0.5 cm    Wound Depth (cm) 0.1 cm    Wound Volume (cm^3) 0.02 cm^3    Wound Surface Area (cm^2) 0.2 cm^2    Margins Unattached edges (unapproximated)    Drainage Amount  Minimal    Drainage Description Serosanguineous    Treatment Cleansed;Debridement (Selective)    Wound Properties Date First Assessed: 11/21/21 Time First Assessed: 1130 Wound Type: Incision - Dehisced Location: Knee Location Orientation: Anterior;Right Present on Admission: Yes   Wound Image Images linked: 1    Dressing Type Bismuth petroleum    Dressing Changed Changed    Dressing Status Old drainage    Dressing Change Frequency PRN    Site / Wound Assessment Red;Yellow    % Wound base Red or Granulating 100%    Wound Length (cm) 1.2 cm    Wound Width (cm) 0.6 cm    Wound Surface Area (cm^2) 0.72 cm^2    Margins Epibole (rolled edges)    Drainage Amount Scant    Drainage Description Serosanguineous    Treatment Cleansed;Debridement (Selective)    Selective Debridement - Location Wound beds and periwounds    Selective Debridement - Tools Used Forceps;Scalpel    Selective Debridement - Tissue Removed slough    Wound Therapy - Clinical Statement All wounds measured and photographed this session.  All wounds continue to approximate and granulate with less debridement needed this session.  continued wtih xerform on all wounds except medihoney on shin and only placed a bandaid on lateral ankle as nearly healed.  Therex completed this session for Rt quad strengtheninging.  Pt continues to be weak but noted slow gains in strength.  Encouraged to continue working diligently on strength at home.    Wound Therapy - Functional Problem List gait, bathing, dressing    Factors Delaying/Impairing Wound Healing Diabetes Mellitus;Immobility;Multiple medical problems;Tobacco use    Hydrotherapy Plan Debridement;Dressing change;Electrical stimulation;Patient/family education;Pulsatile lavage  with suction    Wound Therapy - Frequency 2X / week    Wound Therapy - Current Recommendations PT    Wound Plan Continue with debridement and dressing changes.  measure and photograph weekly.  Continue LE strengthening exericses.    Dressing  Rt knee: xeroform, 4X4, 1/2" foam, medipore tape    Dressing Great toe xerform, 2x2, 2" conform, 1" coban and #1 netting    Dressing Shin: medihoney    Manual Therapy Lateral ankle:bandaid                       PT Short Term Goals - 01/10/22 1725       PT SHORT TERM GOAL #1   Title Patient wound will be free from slough to demonstrate wound healing.    Period Weeks    Status On-going               PT Long Term Goals - 02/05/22 0843       PT LONG TERM GOAL #1   Title Patient wound to be healed to reduce risk of infection.    Status On-going      PT LONG TERM GOAL #2   Title Paitent will be able to transition to formal PT for knee rehab to improve functional mobility and strength.    Status On-going      PT LONG TERM GOAL #3   Title PT to be walking with least assistive device    Time 3    Status New      PT LONG TERM GOAL #4   Title Patient will demonstrate at least 4/5 R knee extension for improve gait mechanics.    Time 4    Period Weeks    Status New    Target Date 02/28/22  PLAN: Continue with debridement and dressing changes.  measure and photograph weekly.  Continue LE strengthening exericses. Attempt wallslides next session.       Patient will benefit from skilled therapeutic intervention in order to improve the following deficits and impairments:     Visit Diagnosis: Cellulitis, wound, post-operative  Multiple open wounds of right lower extremity, initial encounter  Other abnormalities of gait and mobility  Muscle weakness (generalized)     Problem List Patient Active Problem List   Diagnosis Date Noted   Wound infection after surgery 09/27/2021   Postoperative wound  cellulitis 09/26/2021   Rupture of right quadriceps tendon    Chronic hepatitis C without hepatic coma ([place]) 08/08/2021   Hepatocellular carcinoma ([place]) 08/08/2021   COPD (chronic obstructive pulmonary disease) ([place]) 08/08/2021   Hypertension 08/08/2021   Other cirrhosis of liver ([place]) 08/08/2021   Type 2 diabetes mellitus ([place]) 08/08/2021   Generalized abdominal pain 06/06/2021   Dysphagia 06/06/2021   Gastroesophageal reflux disease 06/06/2021   Abnormal liver ultrasound 06/06/2021   Colon cancer screening 06/06/2021   Elevated LFTs 06/06/2021   GREATER TROCHANTERIC BURSITIS 07/03/2007   INCREASED BLOOD PRESSURE 07/03/2007   BACK PAIN, LUMBAR 05/29/2007   NUMBNESS 05/29/2007   INSOMNIA, CHRONIC 05/13/2007   HYPERLIPIDEMIA 01/13/2007   ANXIETY DEPRESSION 01/13/2007   SYNDROME, CHRONIC PAIN 01/13/2007   CVA 01/13/2007   ARTHRITIS 01/13/2007    Teena Irani, PTA 02/21/2022, 10:07 AM Teena Irani, PTA/CLT [place] Ph: [employer] [address] [place], [place], [address] Phone: [phone]   Fax:  [phone]  Name: Danny [name] MRN: [id number] Date of Birth: [birth year]

## 2022-02-22 ENCOUNTER — Other Ambulatory Visit: Payer: Self-pay | Admitting: Orthopedic Surgery

## 2022-02-22 DIAGNOSIS — T8189XD Other complications of procedures, not elsewhere classified, subsequent encounter: Secondary | ICD-10-CM

## 2022-02-22 MED ORDER — OXYCODONE-ACETAMINOPHEN 10-325 MG PO TABS: 1.0000 | ORAL_TABLET | ORAL | 0 refills | Status: DC | PRN

## 2022-02-23 ENCOUNTER — Encounter (HOSPITAL_COMMUNITY): Payer: Self-pay | Admitting: Physical Therapy

## 2022-02-26 ENCOUNTER — Ambulatory Visit (HOSPITAL_COMMUNITY): Payer: Medicaid Other | Attending: Family Medicine

## 2022-02-26 ENCOUNTER — Other Ambulatory Visit: Payer: Self-pay | Admitting: Orthopedic Surgery

## 2022-02-26 ENCOUNTER — Encounter (HOSPITAL_COMMUNITY): Payer: Self-pay

## 2022-02-26 DIAGNOSIS — R269 Unspecified abnormalities of gait and mobility: Secondary | ICD-10-CM | POA: Diagnosis not present

## 2022-02-26 DIAGNOSIS — T8149XA Infection following a procedure, other surgical site, initial encounter: Secondary | ICD-10-CM | POA: Diagnosis not present

## 2022-02-26 DIAGNOSIS — S81801A Unspecified open wound, right lower leg, initial encounter: Secondary | ICD-10-CM

## 2022-02-26 DIAGNOSIS — L03115 Cellulitis of right lower limb: Secondary | ICD-10-CM | POA: Diagnosis not present

## 2022-02-26 DIAGNOSIS — Y838 Other surgical procedures as the cause of abnormal reaction of the patient, or of later complication, without mention of misadventure at the time of the procedure: Secondary | ICD-10-CM | POA: Diagnosis not present

## 2022-02-26 DIAGNOSIS — M6281 Muscle weakness (generalized): Secondary | ICD-10-CM | POA: Insufficient documentation

## 2022-02-26 DIAGNOSIS — R2689 Other abnormalities of gait and mobility: Secondary | ICD-10-CM

## 2022-02-26 MED ORDER — OXYCODONE-ACETAMINOPHEN 10-325 MG PO TABS: 1.0000 | ORAL_TABLET | ORAL | 0 refills | Status: AC | PRN

## 2022-02-26 MED ORDER — OXYCODONE-ACETAMINOPHEN 10-325 MG PO TABS: 1.0000 | ORAL_TABLET | ORAL | 0 refills | Status: DC | PRN

## 2022-02-26 NOTE — Therapy (Signed)
[place], [place], [address] Phone: [phone]   Fax:  [phone]  Wound Care Therapy  Patient Details  Name: Danny [name] MRN: [id number] Date of Birth: Jan 17, 1966 Referring Provider (PT): Arther Abbott MD   Encounter Date: 02/26/2022   PT End of Session - 02/26/22 0947     Visit Number 23    Number of Visits 32    Date for PT Re-Evaluation 02/28/22    Authorization Type Medicaid - [employer].  Requested additional visits 01/31/22    Authorization Time Period [employer] approved 12 visits from 11/21/2021-03/21/2022 then an additional 12 visits from 4/21 thru 7/13; 8 visits requested 01/31/22-02/28/22 - 8 visits approved 3/15-7/13    Authorization - Visit Number 6    Authorization - Number of Visits 8    Progress Note Due on Visit 32    PT Start Time 0835             Past Medical History:  Diagnosis Date   Alcohol abuse    Anxiety    Back pain    CVA (cerebral infarction)    Depression    Diabetes mellitus    Hypertension    Migraines    Stroke [facility])     Past Surgical History:  Procedure Laterality Date   CAST APPLICATION Right 6/71/2458   Procedure: CAST APPLICATION;  Surgeon: Carole Civil, MD;  Location: [facility];  Service: Orthopedics;  Laterality: Right;   FOOT SURGERY     INCISION AND DRAINAGE Right 11/02/2021   Procedure: INCISION AND DRAINAGE RIGHT KNEE;  Surgeon: Carole Civil, MD;  Location: [facility];  Service: Orthopedics;  Laterality: Right;   IR 3D INDEPENDENT WKST  10/11/2021   IR ANGIOGRAM SELECTIVE EACH ADDITIONAL VESSEL  10/11/2021   IR ANGIOGRAM SELECTIVE EACH ADDITIONAL VESSEL  10/11/2021   IR ANGIOGRAM SELECTIVE EACH ADDITIONAL VESSEL  10/11/2021   IR ANGIOGRAM SELECTIVE EACH ADDITIONAL VESSEL  10/11/2021   IR ANGIOGRAM SELECTIVE EACH ADDITIONAL VESSEL  10/11/2021   IR ANGIOGRAM SELECTIVE EACH ADDITIONAL VESSEL  10/11/2021   IR ANGIOGRAM SELECTIVE EACH ADDITIONAL VESSEL   10/25/2021   IR ANGIOGRAM SELECTIVE EACH ADDITIONAL VESSEL  10/25/2021   IR ANGIOGRAM VISCERAL SELECTIVE  10/11/2021   IR EMBO TUMOR ORGAN ISCHEMIA INFARCT INC GUIDE ROADMAPPING  10/25/2021   IR EMBO TUMOR ORGAN ISCHEMIA INFARCT INC GUIDE ROADMAPPING  10/25/2021   IR RADIOLOGIST EVAL & MGMT  08/23/2021   IR RADIOLOGIST EVAL & MGMT  11/21/2021   IR RADIOLOGIST EVAL & MGMT  01/24/2022   IR US GUIDE VASC ACCESS RIGHT  10/11/2021   IR US GUIDE VASC ACCESS RIGHT  10/25/2021   MANDIBLE SURGERY     QUADRICEPS TENDON REPAIR Right 09/04/2021   Procedure: REPAIR QUADRICEP TENDON;  Surgeon: Carole Civil, MD;  Location: [facility];  Service: Orthopedics;  Laterality: Right;   TENDON EXPLORATION Right 11/02/2021   Procedure: QUADRICEPS TENDON AND PATELLAR TENDON EXPLORATION;  Surgeon: Carole Civil, MD;  Location: [facility];  Service: Orthopedics;  Laterality: Right;    There were no vitals filed for this visit.    Subjective Assessment - 02/26/22 0938     Subjective Pt stated pain has gone done a lot, happy with progress.  Reports he has been compliant with HEP daily.  Reports unable to count number of times he's fallen in last 2 months.  Continues to walk with RW, sometimes walks holding on to counter in kitchen or  bathroom.                Therex: Supine: heel slide  Quad sets  Bridge Prone: hip extension Standing: wall slide with therapist guarding  TKE with green theraband 20x 5"- HEP       Wound Therapy - 02/26/22 0001     Subjective Pt stated pain has gone done a lot, happy with progress.  Reports he has been compliant with HEP daily.  Reports unable to count number of times he's fallen in last 2 months.  Continues to walk with RW, sometimes walks holding on to counter in kitchen or bathroom.    Patient and Family Stated Goals wound to heal    Date of Onset 09/04/21    Prior Treatments dressing changes at MD office    Pain Scale 0-10    Pain Score 0-No pain    Evaluation and  Treatment Procedures Explained to Patient/Family Yes    Evaluation and Treatment Procedures agreed to    Wound Properties Date First Assessed: 12/04/21 Time First Assessed: 1010 Location: Pretibial Location Orientation: Proximal;Right Present on Admission: -- , unsure    Wound Image Images linked: 1    Dressing Type Gauze (Comment)    Dressing Changed Changed    Dressing Status Old drainage    Dressing Change Frequency PRN    Site / Wound Assessment Dry;Yellow    % Wound base Red or Granulating 80%    % Wound base Yellow/Fibrinous Exudate 20%    Peri-wound Assessment Erythema (blanchable)    Wound Length (cm) 2.8 cm    Wound Width (cm) 2.3 cm    Wound Surface Area (cm^2) 6.44 cm^2    Margins Unattached edges (unapproximated)    Drainage Amount Minimal    Drainage Description Serosanguineous    Treatment Cleansed;Debridement (Selective)    Wound Properties Date First Assessed: 12/04/21 Time First Assessed: 1010 Location: Ankle Location Orientation: Right;Lateral Present on Admission: No   Wound Image Images linked: 1    Dressing Type --   vaseline, baindaid   Dressing Status Old drainage;Dry    Dressing Change Frequency PRN    Site / Wound Assessment Granulation tissue    % Wound base Red or Granulating 100%    Treatment Cleansed    Wound Properties Date First Assessed: 12/04/21 Location: Toe (Comment  which one) Location Orientation: Anterior;Right Wound Description (Comments): Great Toe Present on Admission: No   Wound Image Images linked: 1    Dressing Type Gauze (Comment);Impregnated gauze (bismuth)    Dressing Changed Changed    Dressing Status Intact                       PT Short Term Goals - 01/10/22 1725       PT SHORT TERM GOAL #1   Title Patient wound will be free from slough to demonstrate wound healing.    Period Weeks    Status On-going               PT Long Term Goals - 02/05/22 0843       PT LONG TERM GOAL #1   Title Patient wound to  be healed to reduce risk of infection.    Status On-going      PT LONG TERM GOAL #2   Title Paitent will be able to transition to formal PT for knee rehab to improve functional mobility and strength.    Status On-going  PT LONG TERM GOAL #3   Title PT to be walking with least assistive device    Time 3    Status New      PT LONG TERM GOAL #4   Title Patient will demonstrate at least 4/5 R knee extension for improve gait mechanics.    Time 4    Period Weeks    Status New    Target Date 02/28/22                    Patient will benefit from skilled therapeutic intervention in order to improve the following deficits and impairments:     Visit Diagnosis: Cellulitis, wound, post-operative  Multiple open wounds of right lower extremity, initial encounter  Other abnormalities of gait and mobility  Muscle weakness (generalized)     Problem List Patient Active Problem List   Diagnosis Date Noted   Wound infection after surgery 09/27/2021   Postoperative wound cellulitis 09/26/2021   Rupture of right quadriceps tendon    Chronic hepatitis C without hepatic coma ([place]) 08/08/2021   Hepatocellular carcinoma ([place]) 08/08/2021   COPD (chronic obstructive pulmonary disease) ([place]) 08/08/2021   Hypertension 08/08/2021   Other cirrhosis of liver ([place]) 08/08/2021   Type 2 diabetes mellitus ([place]) 08/08/2021   Generalized abdominal pain 06/06/2021   Dysphagia 06/06/2021   Gastroesophageal reflux disease 06/06/2021   Abnormal liver ultrasound 06/06/2021   Colon cancer screening 06/06/2021   Elevated LFTs 06/06/2021   GREATER TROCHANTERIC BURSITIS 07/03/2007   INCREASED BLOOD PRESSURE 07/03/2007   BACK PAIN, LUMBAR 05/29/2007   NUMBNESS 05/29/2007   INSOMNIA, CHRONIC 05/13/2007   HYPERLIPIDEMIA 01/13/2007   ANXIETY DEPRESSION 01/13/2007   SYNDROME, CHRONIC PAIN 01/13/2007   CVA 01/13/2007   ARTHRITIS 01/13/2007   Ihor Austin, LPTA/CLT;  CBIS [id number]  Aldona Lento, PTA 02/26/2022, 9:47 AM  [place] [address] [place], [place], [address] Phone: [phone]   Fax:  [phone]  Name: TELLIS SPIVAK MRN: [id number] Date of Birth: 06-Jan-1966

## 2022-02-26 NOTE — Progress Notes (Signed)
  6/21    6/26  7/1  Meds ordered this encounter  Medications   oxyCODONE-acetaminophen (PERCOCET) 10-325 MG tablet    Sig: Take 1 tablet by mouth every 4 (four) hours as needed for up to 5 days for pain.    Dispense:  30 tablet    Refill:  0   oxyCODONE-acetaminophen (PERCOCET) 10-325 MG tablet    Sig: Take 1 tablet by mouth every 4 (four) hours as needed for up to 5 days for pain.    Dispense:  30 tablet    Refill:  0   oxyCODONE-acetaminophen (PERCOCET) 10-325 MG tablet    Sig: Take 1 tablet by mouth every 4 (four) hours as needed for up to 5 days for pain.    Dispense:  30 tablet    Refill:  0

## 2022-02-27 ENCOUNTER — Other Ambulatory Visit (HOSPITAL_COMMUNITY): Payer: Self-pay | Admitting: Physician Assistant

## 2022-02-27 ENCOUNTER — Other Ambulatory Visit (HOSPITAL_COMMUNITY): Payer: Self-pay | Admitting: Student

## 2022-02-27 DIAGNOSIS — Z01818 Encounter for other preprocedural examination: Secondary | ICD-10-CM

## 2022-02-27 DIAGNOSIS — C220 Liver cell carcinoma: Secondary | ICD-10-CM

## 2022-02-27 NOTE — H&P (Signed)
Chief Complaint: Patient was seen in consultation today for multifocal hepatocellular carcinoma at the request of Suttle,Dylan J  Referring Physician(s): [place] Physician: Ruthann Cancer  Patient Status: [place]  History of Present Illness: Danny [name] is a 55 y.o. male with PMH of alcoholic cirrhosis and multifocal hepatocellular carcinoma. Pt is well known to IR service having left hepatic TARE 10/25/21 with Dr. Serafina Royals. At 3 month f/u with Dr. Serafina Royals, treatment options were discussed for continuing care of his right lobe [facility] with repeat hepatic angiogram with Tc-MAA administration followed by right lobe Y-90 two weeks later. Pt presents today for pre-Y90 mapping study.   Past Medical History:  Diagnosis Date   Alcohol abuse    Anxiety    Back pain    CVA (cerebral infarction)    Depression    Diabetes mellitus    Hypertension    Migraines    Stroke [facility]     Past Surgical History:  Procedure Laterality Date   CAST APPLICATION Right 6/76/1950   Procedure: CAST APPLICATION;  Surgeon: Carole Civil, MD;  Location: [facility];  Service: Orthopedics;  Laterality: Right;   FOOT SURGERY     INCISION AND DRAINAGE Right 11/02/2021   Procedure: INCISION AND DRAINAGE RIGHT KNEE;  Surgeon: Carole Civil, MD;  Location: [facility];  Service: Orthopedics;  Laterality: Right;   IR 3D INDEPENDENT WKST  10/11/2021   IR ANGIOGRAM SELECTIVE EACH ADDITIONAL VESSEL  10/11/2021   IR ANGIOGRAM SELECTIVE EACH ADDITIONAL VESSEL  10/11/2021   IR ANGIOGRAM SELECTIVE EACH ADDITIONAL VESSEL  10/11/2021   IR ANGIOGRAM SELECTIVE EACH ADDITIONAL VESSEL  10/11/2021   IR ANGIOGRAM SELECTIVE EACH ADDITIONAL VESSEL  10/11/2021   IR ANGIOGRAM SELECTIVE EACH ADDITIONAL VESSEL  10/11/2021   IR ANGIOGRAM SELECTIVE EACH ADDITIONAL VESSEL  10/25/2021   IR ANGIOGRAM SELECTIVE EACH ADDITIONAL VESSEL  10/25/2021   IR ANGIOGRAM VISCERAL SELECTIVE  10/11/2021   IR EMBO TUMOR ORGAN ISCHEMIA INFARCT  INC GUIDE ROADMAPPING  10/25/2021   IR EMBO TUMOR ORGAN ISCHEMIA INFARCT INC GUIDE ROADMAPPING  10/25/2021   IR RADIOLOGIST EVAL & MGMT  08/23/2021   IR RADIOLOGIST EVAL & MGMT  11/21/2021   IR RADIOLOGIST EVAL & MGMT  01/24/2022   IR US GUIDE VASC ACCESS RIGHT  10/11/2021   IR US GUIDE VASC ACCESS RIGHT  10/25/2021   MANDIBLE SURGERY     QUADRICEPS TENDON REPAIR Right 09/04/2021   Procedure: REPAIR QUADRICEP TENDON;  Surgeon: Carole Civil, MD;  Location: [facility];  Service: Orthopedics;  Laterality: Right;   TENDON EXPLORATION Right 11/02/2021   Procedure: QUADRICEPS TENDON AND PATELLAR TENDON EXPLORATION;  Surgeon: Carole Civil, MD;  Location: [facility];  Service: Orthopedics;  Laterality: Right;    Allergies: Ultram [tramadol] and Benadryl [diphenhydramine]  Medications: Prior to Admission medications   Medication Sig Start Date End Date Taking? Authorizing Provider  albuterol (VENTOLIN HFA) 108 (90 Base) MCG/ACT inhaler Inhale 2 puffs into the lungs every 6 (six) hours as needed for wheezing or shortness of breath.    [provider]  busPIRone (BUSPAR) 15 MG tablet Take 15 mg by mouth 2 (two) times daily.    [provider]  Cholecalciferol (VITAMIN D-3) 125 MCG (5000 UT) TABS Take 5,000 Units by mouth daily.    [provider]  citalopram (CELEXA) 20 MG tablet Take 20 mg by mouth daily.    [provider]  gabapentin (NEURONTIN) 600 MG tablet Take 600  mg by mouth in the morning and at bedtime. 06/24/21   [provider]  hydrOXYzine (ATARAX) 25 MG tablet Take 25 mg by mouth 3 (three) times daily as needed for anxiety.    [provider]  meloxicam (MOBIC) 7.5 MG tablet Take 7.5 mg by mouth in the morning and at bedtime.    [provider]  metFORMIN (GLUCOPHAGE) 500 MG tablet Take 1 tablet (500 mg total) by mouth 2 (two) times daily with a meal. 01/01/18   Julianne Rice, MD  omeprazole (PRILOSEC) 40 MG capsule Take 1  capsule (40 mg total) by mouth daily before breakfast. 06/06/21   Erenest Rasher, PA-C  oxyCODONE-acetaminophen (PERCOCET) 10-325 MG tablet Take 1 tablet by mouth every 4 (four) hours as needed for pain. 02/22/22   Carole Civil, MD  oxyCODONE-acetaminophen (PERCOCET) 10-325 MG tablet Take 1 tablet by mouth every 4 (four) hours as needed for up to 5 days for pain. 02/27/22 03/04/22  Carole Civil, MD  oxyCODONE-acetaminophen (PERCOCET) 10-325 MG tablet Take 1 tablet by mouth every 4 (four) hours as needed for up to 5 days for pain. 02/26/22 03/03/22  Carole Civil, MD  oxyCODONE-acetaminophen (PERCOCET) 10-325 MG tablet Take 1 tablet by mouth every 4 (four) hours as needed for up to 5 days for pain. 03/09/22 03/14/22  Carole Civil, MD  QUEtiapine (SEROQUEL) 100 MG tablet Take 100 mg by mouth at bedtime.    [provider]  sulfamethoxazole-trimethoprim (BACTRIM DS) 800-160 MG tablet Take 1 tablet by mouth 2 (two) times daily. 01/14/22   Carole Civil, MD     Family History  Problem Relation Age of Onset   Heart failure Mother    Stroke Father    Stroke Sister    Cancer Sister        breast cancer   Colon cancer Neg Hx    Liver cancer Neg Hx     Social History   Socioeconomic History   Marital status: Legally Separated    Spouse name: Not on file   Number of children: Not on file   Years of education: Not on file   Highest education level: Not on file  Occupational History   Not on file  Tobacco Use   Smoking status: Every Day    Packs/day: 2.00    Years: 20.00    Total pack years: 40.00    Types: Cigarettes   Smokeless tobacco: Never  Vaping Use   Vaping Use: Never used  Substance and Sexual Activity   Alcohol use: Not Currently    Comment: Quit 11 months ago. Used to drink about 6 pack a day. (documented 06/06/21)   Drug use: Yes    Types: Marijuana    Comment: occas   Sexual activity: Yes    Birth control/protection: None  Other Topics  Concern   Not on file  Social History Narrative   Not on file   Social Determinants of Health   Financial Resource Strain: Not on file  Food Insecurity: Not on file  Transportation Needs: Not on file  Physical Activity: Not on file  Stress: Not on file  Social Connections: Not on file    Review of Systems: A 12 point ROS discussed and pertinent positives are indicated in the HPI above.  All other systems are negative.  Review of Systems  Constitutional:  Negative for chills and fatigue.  Respiratory:  Negative for shortness of breath.   Cardiovascular:  Negative for  chest pain.  Gastrointestinal:  Positive for abdominal pain.  Neurological:  Negative for dizziness and weakness.    Vital Signs: There were no vitals taken for this visit.    Physical Exam Constitutional:      General: He is not in acute distress.    Appearance: He is ill-appearing.  HENT:     Head: Normocephalic and atraumatic.     Mouth/Throat:     Mouth: Mucous membranes are dry.     Pharynx: Oropharynx is clear.  Eyes:     Extraocular Movements: Extraocular movements intact.     Pupils: Pupils are equal, round, and reactive to light.  Cardiovascular:     Rate and Rhythm: Normal rate and regular rhythm.  Neurological:     Mental Status: He is alert.     Imaging: No results found.  Labs:  CBC: Recent Labs    07/10/21 1947 09/26/21 1239 10/11/21 0743 10/25/21 0848  WBC 7.4 7.9 5.9 8.1  HGB 14.6 15.1 14.6 15.0  HCT 43.1 45.5 42.1 44.0  PLT 151 156 113* 113*    COAGS: Recent Labs    06/11/21 0907 10/11/21 0743 10/25/21 0848  INR 1.1 1.1 1.1    BMP: Recent Labs    07/10/21 1947 09/24/21 1005 09/26/21 1239 10/11/21 0743 10/25/21 0848  NA 132*  --  139 135 137  K 3.7  --  4.1 4.1 3.9  CL 99  --  104 106 105  CO2 26  --  '24 23 22  '$ GLUCOSE 140*  --  115* 166* 135*  BUN 18  --  '13 14 14  '$ CALCIUM 8.7*  --  9.5 9.1 9.2  CREATININE 0.80 0.90 0.85 0.78 0.78  GFRNONAA >60   --  >60 >60 >60    LIVER FUNCTION TESTS: Recent Labs    06/11/21 0907 09/26/21 1239 10/11/21 0743 10/25/21 0848  BILITOT 0.8 0.9 0.6 0.7  AST 61* 63* 41 48*  ALT 71* 49* 46* 43  ALKPHOS  --  105 116 114  PROT 7.1 7.2 6.8 7.8  ALBUMIN  --  3.6 3.4* 4.0    TUMOR MARKERS: Recent Labs    06/11/21 0907  AFPTM 12.4*    Assessment and Plan: History of alcoholic cirrhosis and multifocal hepatocellular carcinoma. Pt is well known to IR service having left hepatic TARE 10/25/21 with Dr. Serafina Royals. At 3 month f/u with Dr. Serafina Royals, treatment options were discussed for continuing care of his right lobe [facility] with repeat hepatic angiogram with Tc-MAA administration followed by right lobe Y-90 two weeks later. Pt presents today for pre-Y90 mapping study.   Pt resting on stretcher. He is A&O, calm and pleasant.  He is in no distress.  Pt states he is NPO per order.  He denies the use of AC/AP. Today's labs pending.   Risks and benefits discussed with the patient including, but not limited to bleeding, infection, vascular injury, post procedural pain, nausea, vomiting and fatigue, contrast induced renal failure, liver failure, radiation injury to the bowel, radiation induced cholecystitis, neutropenia and possible need for additional procedures.  All of the patient's questions were answered, patient is agreeable to proceed. Consent signed and in chart.   Thank you for this interesting consult.  I greatly enjoyed meeting Danny [name] and look forward to participating in their care.  A copy of this report was sent to the requesting provider on this date.  Electronically Signed: Tyson Alias, NP 02/28/2022, 8:25 AM   I  spent a total of 20 minutes in face to face in clinical consultation, greater than 50% of which was counseling/coordinating care for hepatocellular carcinoma.

## 2022-02-28 ENCOUNTER — Other Ambulatory Visit: Payer: Self-pay

## 2022-02-28 ENCOUNTER — Ambulatory Visit (HOSPITAL_COMMUNITY)
Admission: RE | Admit: 2022-02-28 | Discharge: 2022-02-28 | Disposition: A | Discharge status: Home / Self Care | Payer: Medicaid Other | Source: Ambulatory Visit | Attending: Interventional Radiology | Admitting: Interventional Radiology

## 2022-02-28 ENCOUNTER — Encounter (HOSPITAL_COMMUNITY): Payer: Self-pay

## 2022-02-28 ENCOUNTER — Encounter (HOSPITAL_COMMUNITY)
Admission: RE | Admit: 2022-02-28 | Discharge: 2022-02-28 | Disposition: A | Discharge status: Home / Self Care | Payer: Medicaid Other | Source: Ambulatory Visit | Attending: Interventional Radiology | Admitting: Interventional Radiology

## 2022-02-28 ENCOUNTER — Other Ambulatory Visit (HOSPITAL_COMMUNITY): Payer: Self-pay | Admitting: Interventional Radiology

## 2022-02-28 ENCOUNTER — Ambulatory Visit (HOSPITAL_COMMUNITY): Payer: Medicaid Other | Admitting: Physical Therapy

## 2022-02-28 DIAGNOSIS — E119 Type 2 diabetes mellitus without complications: Secondary | ICD-10-CM | POA: Diagnosis not present

## 2022-02-28 DIAGNOSIS — C220 Liver cell carcinoma: Secondary | ICD-10-CM

## 2022-02-28 DIAGNOSIS — K7030 Alcoholic cirrhosis of liver without ascites: Secondary | ICD-10-CM | POA: Diagnosis not present

## 2022-02-28 DIAGNOSIS — Z7984 Long term (current) use of oral hypoglycemic drugs: Secondary | ICD-10-CM | POA: Diagnosis not present

## 2022-02-28 DIAGNOSIS — Z01818 Encounter for other preprocedural examination: Secondary | ICD-10-CM

## 2022-02-28 HISTORY — PX: IR US GUIDE VASC ACCESS RIGHT: IMG2390

## 2022-02-28 HISTORY — PX: IR ANGIOGRAM VISCERAL SELECTIVE: IMG657

## 2022-02-28 HISTORY — PX: IR ANGIOGRAM SELECTIVE EACH ADDITIONAL VESSEL: IMG667

## 2022-02-28 HISTORY — PX: IR 3D INDEPENDENT WKST: IMG2385

## 2022-02-28 LAB — CBC WITH DIFFERENTIAL/PLATELET
Abs Immature Granulocytes: 0.01 10*3/uL (ref 0.00–0.07)
Basophils Absolute: 0 10*3/uL (ref 0.0–0.1)
Basophils Relative: 1 %
Eosinophils Absolute: 0.3 10*3/uL (ref 0.0–0.5)
Eosinophils Relative: 6 %
HCT: 35.8 % — ABNORMAL LOW (ref 39.0–52.0)
Hemoglobin: 12.1 g/dL — ABNORMAL LOW (ref 13.0–17.0)
Immature Granulocytes: 0 %
Lymphocytes Relative: 25 %
Lymphs Abs: 1.1 10*3/uL (ref 0.7–4.0)
MCH: 30.9 pg (ref 26.0–34.0)
MCHC: 33.8 g/dL (ref 30.0–36.0)
MCV: 91.6 fL (ref 80.0–100.0)
Monocytes Absolute: 0.5 10*3/uL (ref 0.1–1.0)
Monocytes Relative: 11 %
Neutro Abs: 2.6 10*3/uL (ref 1.7–7.7)
Neutrophils Relative %: 57 %
Platelets: 130 10*3/uL — ABNORMAL LOW (ref 150–400)
RBC: 3.91 MIL/uL — ABNORMAL LOW (ref 4.22–5.81)
RDW: 13.5 % (ref 11.5–15.5)
WBC: 4.5 10*3/uL (ref 4.0–10.5)
nRBC: 0 % (ref 0.0–0.2)

## 2022-02-28 LAB — COMPREHENSIVE METABOLIC PANEL
ALT: 68 U/L — ABNORMAL HIGH (ref 0–44)
AST: 149 U/L — ABNORMAL HIGH (ref 15–41)
Albumin: 3 g/dL — ABNORMAL LOW (ref 3.5–5.0)
Alkaline Phosphatase: 140 U/L — ABNORMAL HIGH (ref 38–126)
Anion gap: 8 (ref 5–15)
BUN: 16 mg/dL (ref 6–20)
CO2: 23 mmol/L (ref 22–32)
Calcium: 9 mg/dL (ref 8.9–10.3)
Chloride: 107 mmol/L (ref 98–111)
Creatinine, Ser: 0.9 mg/dL (ref 0.61–1.24)
GFR, Estimated: >60 mL/min (ref >60)
Glucose, Bld: 178 mg/dL — ABNORMAL HIGH (ref 70–99)
Potassium: 3.9 mmol/L (ref 3.5–5.1)
Sodium: 138 mmol/L (ref 135–145)
Total Bilirubin: 1.1 mg/dL (ref 0.3–1.2)
Total Protein: 6.3 g/dL — ABNORMAL LOW (ref 6.5–8.1)

## 2022-02-28 LAB — PROTIME-INR
INR: 1.1 (ref 0.8–1.2)
Prothrombin Time: 14 seconds (ref 11.4–15.2)

## 2022-02-28 LAB — GLUCOSE, CAPILLARY: Glucose-Capillary: 143 mg/dL — ABNORMAL HIGH (ref 70–99)

## 2022-02-28 MED ORDER — IOHEXOL 300 MG/ML  SOLN
100.0000 mL | Freq: Once | INTRAMUSCULAR | Status: AC | PRN
  Administered 2022-02-28: 17 mL via INTRA_ARTERIAL

## 2022-02-28 MED ORDER — IOHEXOL 300 MG/ML  SOLN
100.0000 mL | Freq: Once | INTRAMUSCULAR | Status: AC | PRN
  Administered 2022-02-28: 31 mL via INTRA_ARTERIAL

## 2022-02-28 MED ORDER — SODIUM CHLORIDE 0.9 % IV SOLN: INTRAVENOUS | Status: DC

## 2022-02-28 MED ORDER — LIDOCAINE HCL 1 % IJ SOLN
INTRAMUSCULAR | Status: AC
  Filled 2022-02-28: qty 20

## 2022-02-28 MED ORDER — FENTANYL CITRATE (PF) 100 MCG/2ML IJ SOLN
INTRAMUSCULAR | Status: AC | PRN
  Administered 2022-02-28: 50 ug via INTRAVENOUS

## 2022-02-28 MED ORDER — LIDOCAINE HCL 1 % IJ SOLN
INTRAMUSCULAR | Status: AC | PRN
  Administered 2022-02-28: 10 mL via INTRADERMAL

## 2022-02-28 MED ORDER — MIDAZOLAM HCL 2 MG/2ML IJ SOLN
INTRAMUSCULAR | Status: AC | PRN
  Administered 2022-02-28: 2 mg via INTRAVENOUS

## 2022-02-28 MED ORDER — MIDAZOLAM HCL 2 MG/2ML IJ SOLN
INTRAMUSCULAR | Status: AC
  Filled 2022-02-28: qty 6

## 2022-02-28 MED ORDER — FENTANYL CITRATE (PF) 100 MCG/2ML IJ SOLN
INTRAMUSCULAR | Status: AC
  Filled 2022-02-28: qty 4

## 2022-02-28 MED ORDER — TECHNETIUM TO 99M ALBUMIN AGGREGATED
4.1000 | Freq: Once | INTRAVENOUS | Status: AC
  Administered 2022-02-28: 4.1 via INTRAVENOUS

## 2022-02-28 NOTE — Sedation Documentation (Signed)
Patient transported to MI via stretcher accompanied by this RN.

## 2022-02-28 NOTE — Procedures (Signed)
Interventional Radiology Procedure Note  Procedure:  1) Right hepatic, segment VII, and segment VIII hepatic angiography 2) Tc-MAA administration from segment VIII hepatic artery  Findings: Please refer to procedural dictation for full description.  R CFA 6 Fr Angioseal closure  Complications: None immediate  Estimated Blood Loss: < 5 mL  Recommendations: Strict 4 hour bedrest, 2 hours flat (until 13:00) followed by 2 hours head of bed up to 30 degrees (15:00). NM study to follow. IR will arrange for Y-90 in approximately 2 weeks.   Ruthann Cancer, MD Pager: [phone]

## 2022-02-28 NOTE — Discharge Instructions (Addendum)
Please call Interventional Radiology clinic [phone] with any questions or concerns.  You may remove your dressing and shower tomorrow.    Hepatic Artery Radioembolization, Care After The following information offers guidance on how to care for yourself after your procedure. Your health care provider may also give you more specific instructions. If you have problems or questions, contact your health care provider. What can I expect after the procedure? After the procedure, it is possible to have: A slight fever for 7 to 10 days. This may be accompanied by pain, nausea, or vomiting, which is referred to as post-embolization syndrome. You may be given medicine to help relieve these symptoms. If your fever gets worse, tell your health care provider. Tiredness (fatigue). Loss of appetite. This should gradually improve after about 1 week. Abdominal pain on your right side. Soreness and tenderness in your groin area where the needle and catheter were placed (puncture site).  Follow these instructions at home:  Puncture site care  Follow instructions from your health care provider about how to take care of the puncture site. Make sure you: Wash your hands with soap and water for at least 20 seconds before and after you change your bandage (dressing). If soap and water are not available, use hand sanitizer. Change your dressing as told by your health care provider. Check your puncture site every day for signs of infection. Check for: More redness, swelling, or pain. Fluid or blood. Warmth. Pus or a bad smell. Activity Rest as told by your health care provider. Return to your normal activities as told by your health care provider. Ask your health care provider what activities are safe for you. Avoid sitting for a long time without moving. Get up to take short walks every 1-2 hours. This is important to improve blood flow and breathing. Ask for help if you feel weak or unsteady. If you were  given a sedative during the procedure, it can affect you for several hours. Do not drive or operate machinery until your health care provider says that it is safe. Do not lift anything that is heavier than 10 lb (4.5 kg), or the limit that you are told, until your health care provider says that it is safe. Medicines Take over-the-counter and prescription medicines only as told by your health care provider. Ask your health care provider if the medicine prescribed to you: Requires you to avoid driving or using machinery. Can cause constipation. You may need to take these actions to prevent or treat constipation: Drink enough fluid to keep your urine pale yellow. Take over-the-counter or prescription medicines. Eat foods that are high in fiber, such as beans, whole grains, and fresh fruits and vegetables. Limit foods that are high in fat and processed sugars, such as fried or sweet foods. General instructions Eat frequent, small meals until your appetite returns. Follow instructions from your health care provider about eating or drinking restrictions. Do not take baths, swim, or use a hot tub until your health care provider approves. You may take showers. Wash your puncture site with mild soap and water, and pat the area dry. Wear compression stockings as told by your health care provider. These stockings help to prevent blood clots and reduce swelling in your legs. Keep all follow-up visits. This is important. You may need to have blood tests and imaging tests. Contact a health care provider if: You have any of these signs of infection: More redness, swelling, or pain around your puncture site. Fluid or blood   coming from your puncture site. Warmth coming from your puncture site. Pus or a bad smell coming from your puncture site. You have pain that: Gets worse. Does not get better with medicine. Feels like very bad heartburn. Is in the middle of your abdomen, above your belly button. You have  any signs of infection or liver failure, such as: Your skin or the white parts of your eyes turn yellow (jaundice). The color of your urine changes to dark brown. The color of your stool (feces) changes to light yellow. Your abdominal measurement (girth) increases in a short period of time. You gain more than 5 lb (2.3 kg) in a short period of time. Get help right away if: You have a fever that lasts more than 10 days or is higher than what your health care provider told you to expect. You develop any of the following in your legs: Pain. Swelling. Skin that is cold or pale or turns blue. You have chest pain. You have blood in your vomit, saliva, or stool. You have trouble breathing. These symptoms may represent a serious problem that is an emergency. Do not wait to see if the symptoms will go away. Get medical help right away. Call your local emergency services (911 in the [place] Do not drive yourself to the hospital. Summary After the procedure, it is possible to have a slight fever for up to 7-10 days, tiredness, loss of appetite, abdominal pain on the right side, and groin tenderness where the catheter was placed. Do not come in close contact with people for up to a week after your procedure, as told by your health care provider. Follow instructions from your health care provider about how to take care of the puncture site. Contact a health care provider if you have any signs of infection. Get help right away if you develop pain or swelling in your legs or if your legs feel cool or look pale. This information is not intended to replace advice given to you by your health care provider. Make sure you discuss any questions you have with your health care provider. Document Revised: 07/30/2020 Document Reviewed: 07/30/2020 Elsevier Patient Education  2022 [employer]      Moderate Conscious Sedation, Adult, Care After This sheet gives you information about how to care for yourself  after your procedure. Your health care provider may also give you more specific instructions. If you have problems or questions, contact your health care provider. What can I expect after the procedure? After the procedure, it is common to have: Sleepiness for several hours. Impaired judgment for several hours. Difficulty with balance. Vomiting if you eat too soon. Follow these instructions at home: For the time period you were told by your health care provider: Rest. Do not participate in activities where you could fall or become injured. Do not drive or use machinery. Do not drink alcohol. Do not take sleeping pills or medicines that cause drowsiness. Do not make important decisions or sign legal documents. Do not take care of children on your own.      Eating and drinking Follow the diet recommended by your health care provider. Drink enough fluid to keep your urine pale yellow. If you vomit: Drink water, juice, or soup when you can drink without vomiting. Make sure you have little or no nausea before eating solid foods.   General instructions Take over-the-counter and prescription medicines only as told by your health care provider. Have a responsible adult stay with you   for the time you are told. It is important to have someone help care for you until you are awake and alert. Do not smoke. Keep all follow-up visits as told by your health care provider. This is important. Contact a health care provider if: You are still sleepy or having trouble with balance after 24 hours. You feel light-headed. You keep feeling nauseous or you keep vomiting. You develop a rash. You have a fever. You have redness or swelling around the IV site. Get help right away if: You have trouble breathing. You have new-onset confusion at home. Summary After the procedure, it is common to feel sleepy, have impaired judgment, or feel nauseous if you eat too soon. Rest after you get home. Know the  things you should not do after the procedure. Follow the diet recommended by your health care provider and drink enough fluid to keep your urine pale yellow. Get help right away if you have trouble breathing or new-onset confusion at home. This information is not intended to replace advice given to you by your health care provider. Make sure you discuss any questions you have with your health care provider. Document Revised: 12/24/2019 Document Reviewed: 07/22/2019 Elsevier Patient Education  2021 [employer] Inc[employer]   AFTER YOUR NEXT PROCEDURE  Radiation precautions For up to a week after your procedure, there will be a small amount of radioactivity near your liver. This is not especially dangerous to other people. However, as told by your health care provider, you should follow these precautions for 7 days: Do not come in close contact with people. Do not sleep in the same bed as someone else. Do not hold children or babies. Do not have contact with pregnant women.  Post Y-90 Radioembolization Discharge Instructions  You have been given a radioactive material during your procedure.  While it is safe for you to be discharged home from the hospital, you need to proceed directly home.    Do not use public transportation, including air travel, lasting more than 2 hours for 1 week.  Avoid crowded public places for 1 week.  Adult visitors should try to avoid close contact with you for 1 week.    Children and pregnant females should not visit or have close contact with you for 1 week.  Items that you touch are not radioactive.  Do not sleep in the same bed as your partner for 1 week, and a condom should be used for sexual activity during the first 24 hours.  Your blood may be radioactive and caution should be used if any bleeding occurs during the recovery period.  Body fluids may be radioactive for 24 hours.  Wash your hands after voiding.  Men should sit to urinate.  Dispose of any soiled  materials (flush down toilet or place in trash at home) during the first day.  Drink 6 to 8 glasses of fluids per day for 5 days to hydrate yourself.  If you need to see a doctor during the first week, you must let them know that you were treated with yttrium-90 microspheres, and will be slightly radioactive.  They can call Interventional Radiology [phone] with any questions. 

## 2022-03-03 ENCOUNTER — Other Ambulatory Visit: Payer: Self-pay | Admitting: Orthopedic Surgery

## 2022-03-04 ENCOUNTER — Other Ambulatory Visit: Payer: Self-pay | Admitting: Orthopedic Surgery

## 2022-03-04 MED ORDER — OXYCODONE-ACETAMINOPHEN 10-325 MG PO TABS: 1.0000 | ORAL_TABLET | ORAL | 0 refills | Status: AC | PRN

## 2022-03-05 ENCOUNTER — Telehealth: Payer: Self-pay | Admitting: Radiology

## 2022-03-05 ENCOUNTER — Ambulatory Visit (HOSPITAL_COMMUNITY): Payer: Medicaid Other | Admitting: Physical Therapy

## 2022-03-05 DIAGNOSIS — S81801A Unspecified open wound, right lower leg, initial encounter: Secondary | ICD-10-CM

## 2022-03-05 DIAGNOSIS — R2689 Other abnormalities of gait and mobility: Secondary | ICD-10-CM

## 2022-03-05 DIAGNOSIS — T8149XA Infection following a procedure, other surgical site, initial encounter: Secondary | ICD-10-CM | POA: Diagnosis not present

## 2022-03-05 DIAGNOSIS — M6281 Muscle weakness (generalized): Secondary | ICD-10-CM

## 2022-03-05 NOTE — Therapy (Signed)
[facility] [address] [place], [place], [address] Phone: [phone]   Fax:  [phone]  Wound Care Therapy  Patient Details  Name: Danny [name] MRN: [id number] Date of Birth: 07-20-1966 Referring Provider (PT): Fuller Canada MD   Encounter Date: 03/05/2022   PT End of Session - 03/05/22 0830     Visit Number 24    Number of Visits 32    Date for PT Re-Evaluation 03/21/22    Authorization Type Medicaid - [employer].  Requested additional visits 01/31/22    Authorization Time Period [employer] approved 12 visits from 11/21/2021-03/21/2022 then an additional 12 visits from 4/21 thru 7/13; 8 visits requested 01/31/22-02/28/22 - 8 visits approved 3/15-7/13    Authorization - Visit Number 7    Authorization - Number of Visits 8    Progress Note Due on Visit 27    PT Start Time 0835    PT Stop Time 0955    PT Time Calculation (min) 80 min             Past Medical History:  Diagnosis Date   Alcohol abuse    Anxiety    Back pain    CVA (cerebral infarction)    Depression    Diabetes mellitus    Hypertension    Migraines    Stroke [facility])     Past Surgical History:  Procedure Laterality Date   CAST APPLICATION Right 11/02/2021   Procedure: CAST APPLICATION;  Surgeon: Vickki Hearing, MD;  Location: [facility];  Service: Orthopedics;  Laterality: Right;   FOOT SURGERY     INCISION AND DRAINAGE Right 11/02/2021   Procedure: INCISION AND DRAINAGE RIGHT KNEE;  Surgeon: Vickki Hearing, MD;  Location: [facility] ORS;  Service: Orthopedics;  Laterality: Right;   IR 3D INDEPENDENT WKST  10/11/2021   IR 3D INDEPENDENT WKST  02/28/2022   IR ANGIOGRAM SELECTIVE EACH ADDITIONAL VESSEL  10/11/2021   IR ANGIOGRAM SELECTIVE EACH ADDITIONAL VESSEL  10/11/2021   IR ANGIOGRAM SELECTIVE EACH ADDITIONAL VESSEL  10/11/2021   IR ANGIOGRAM SELECTIVE EACH ADDITIONAL VESSEL  10/11/2021   IR ANGIOGRAM SELECTIVE EACH ADDITIONAL VESSEL  10/11/2021   IR ANGIOGRAM  SELECTIVE EACH ADDITIONAL VESSEL  10/11/2021   IR ANGIOGRAM SELECTIVE EACH ADDITIONAL VESSEL  10/25/2021   IR ANGIOGRAM SELECTIVE EACH ADDITIONAL VESSEL  10/25/2021   IR ANGIOGRAM SELECTIVE EACH ADDITIONAL VESSEL  02/28/2022   IR ANGIOGRAM SELECTIVE EACH ADDITIONAL VESSEL  02/28/2022   IR ANGIOGRAM SELECTIVE EACH ADDITIONAL VESSEL  02/28/2022   IR ANGIOGRAM VISCERAL SELECTIVE  10/11/2021   IR ANGIOGRAM VISCERAL SELECTIVE  02/28/2022   IR EMBO TUMOR ORGAN ISCHEMIA INFARCT INC GUIDE ROADMAPPING  10/25/2021   IR EMBO TUMOR ORGAN ISCHEMIA INFARCT INC GUIDE ROADMAPPING  10/25/2021   IR RADIOLOGIST EVAL & MGMT  08/23/2021   IR RADIOLOGIST EVAL & MGMT  11/21/2021   IR RADIOLOGIST EVAL & MGMT  01/24/2022   IR US GUIDE VASC ACCESS RIGHT  10/11/2021   IR US GUIDE VASC ACCESS RIGHT  10/25/2021   IR US GUIDE VASC ACCESS RIGHT  02/28/2022   MANDIBLE SURGERY     QUADRICEPS TENDON REPAIR Right 09/04/2021   Procedure: REPAIR QUADRICEP TENDON;  Surgeon: Vickki Hearing, MD;  Location: [facility];  Service: Orthopedics;  Laterality: Right;   TENDON EXPLORATION Right 11/02/2021   Procedure: QUADRICEPS TENDON AND PATELLAR TENDON EXPLORATION;  Surgeon: Vickki Hearing, MD;  Location: [facility];  Service: Orthopedics;  Laterality: Right;  There were no vitals filed for this visit.      There ex Sitting LAQ AA x 3 pt then refused to complete more Supine: Quad set x 3 then pt refused to complete more.          Wound Therapy - 03/05/22 0001     Subjective Pt states that he does not want to do his exercises.  He had radiation on Friday and fell afterwards hurting his Rt elbow.  He is sore from the fall and feels ill from the radiation.    Patient and Family Stated Goals wound to heal    Date of Onset 09/04/21    Prior Treatments dressing changes at MD office    Pain Scale 0-10    Pain Score 5     Evaluation and Treatment Procedures Explained to Patient/Family Yes    Evaluation and Treatment Procedures agreed  to    Wound Properties Date First Assessed: 12/04/21 Time First Assessed: 1010 Location: Pretibial Location Orientation: Proximal;Right Present on Admission: -- , unsure    Dressing Type Gauze (Comment)    Dressing Changed Changed    Dressing Status Old drainage    Dressing Change Frequency PRN    Site / Wound Assessment Bleeding;Granulation tissue;Yellow    % Wound base Red or Granulating 90%    % Wound base Yellow/Fibrinous Exudate 10%    Peri-wound Assessment Erythema (blanchable)    Wound Length (cm) 3 cm    Wound Width (cm) 2.2 cm    Wound Depth (cm) 0.1 cm    Wound Volume (cm^3) 0.66 cm^3    Wound Surface Area (cm^2) 6.6 cm^2    Margins Unattached edges (unapproximated)    Drainage Amount Minimal    Drainage Description Odor - foul;Green    Treatment Cleansed;Debridement (Selective)    Wound Properties Date First Assessed: 12/04/21 Time First Assessed: 1010 Location: Ankle Location Orientation: Right;Lateral Present on Admission: No Final Assessment Date: 03/05/22 Final Assessment Time: 0840   Dressing Type --    Dressing Status --    Dressing Change Frequency --    Site / Wound Assessment --    Peri-wound Assessment --    Margins --    Wound Properties Date First Assessed: 12/04/21 Location: Toe (Comment  which one) Location Orientation: Anterior;Right Wound Description (Comments): Great Toe Present on Admission: No   Dressing Type Gauze (Comment);Impregnated gauze (bismuth)    Dressing Status Intact    Dressing Change Frequency PRN    Site / Wound Assessment Pink;Yellow    % Wound base Red or Granulating 80%    % Wound base Yellow/Fibrinous Exudate 20%    Peri-wound Assessment Erythema (blanchable)    Wound Length (cm) 0.3 cm    Wound Width (cm) 0.2 cm    Wound Surface Area (cm^2) 0.06 cm^2    Margins Unattached edges (unapproximated)    Treatment Cleansed    Wound Properties Date First Assessed: 11/21/21 Time First Assessed: 1130 Wound Type: Incision - Dehisced  Location: Knee Location Orientation: Anterior;Right Present on Admission: Yes Final Assessment Date: 03/05/22 Final Assessment Time: 0850   Dressing Type --    Dressing Status --    Dressing Change Frequency --    Site / Wound Assessment --    Margins --    Selective Debridement - Location Anterior leg wound and toe wound    Selective Debridement - Tools Used Forceps    Selective Debridement - Tissue Removed slough    Wound Therapy - Clinical  Statement Initial knee wound and ankle wound have healed.  Anterior knee wound has a slight smell and drainage has a green tint.  Therapist cleansed wound as well as using medihoney with dressing.  Toe wound is almost healed and most likely wil be healed next session.  Pt would not complete exercises as he said that he was sore from his fall on Friday and felt ill from the chemo on Friday.  Therapist encourated pt to complete 100 quad sets a day.  Therapist had pt repeat three times that he was to completed 100 quad set a day.    Wound Therapy - Functional Problem List gait, bathing, dressing    Factors Delaying/Impairing Wound Healing Diabetes Mellitus;Immobility;Multiple medical problems;Tobacco use    Hydrotherapy Plan Debridement;Dressing change;Electrical stimulation;Patient/family education;Pulsatile lavage with suction    Wound Therapy - Frequency 2X / week    Wound Therapy - Current Recommendations PT    Wound Plan Continue with debridement and dressing changes as needed. If anterior wound has green drainage contact MD re possible antibiotic.   measure and photograph weekly.  Continue Encouraging pt to complete HEP as well as completing  LE strengthening exericses in department. .    Dressing  Anterior leg dressed with medihoney 4x4 and kerlix; great toe bandaid. Rt elbow was cleansed therapist then placed xeroform followed by 3" kling.   Dressing --    Manual Therapy --                       PT Short Term Goals - 02/26/22 1851        PT SHORT TERM GOAL #1   Title Patient wound will be free from slough to demonstrate wound healing.    Status  Ongoing               PT Long Term Goals - 02/26/22 1852       PT LONG TERM GOAL #1   Title Patient wound to be healed to reduce risk of infection.    Status On-going      PT LONG TERM GOAL #2   Title Paitent will be able to transition to formal PT for knee rehab to improve functional mobility and strength.    Status On-going      PT LONG TERM GOAL #3   Title PT to be walking with least assistive device    Baseline 02/26/22:  unable to count number of falls, encouraged to continue wtih RW at all times for fall prevention    Status On-going      PT LONG TERM GOAL #4   Title Patient will demonstrate at least 4/5 R knee extension for improve gait mechanics.    Baseline 02/26/22: Quad 1/5    Status On-going                    Patient will benefit from skilled therapeutic intervention in order to improve the following deficits and impairments:     Visit Diagnosis: Cellulitis, wound, post-operative  Multiple open wounds of right lower extremity, initial encounter  Other abnormalities of gait and mobility  Muscle weakness (generalized)     Problem List Patient Active Problem List   Diagnosis Date Noted   Wound infection after surgery 09/27/2021   Postoperative wound cellulitis 09/26/2021   Rupture of right quadriceps tendon    Chronic hepatitis C without hepatic coma (HCC) 08/08/2021   Hepatocellular carcinoma (HCC) 08/08/2021   COPD (chronic obstructive pulmonary  disease) (HCC) 08/08/2021   Hypertension 08/08/2021   Other cirrhosis of liver (HCC) 08/08/2021   Type 2 diabetes mellitus (HCC) 08/08/2021   Generalized abdominal pain 06/06/2021   Dysphagia 06/06/2021   Gastroesophageal reflux disease 06/06/2021   Abnormal liver ultrasound 06/06/2021   Colon cancer screening 06/06/2021   Elevated LFTs 06/06/2021   GREATER TROCHANTERIC BURSITIS  07/03/2007   INCREASED BLOOD PRESSURE 07/03/2007   BACK PAIN, LUMBAR 05/29/2007   NUMBNESS 05/29/2007   INSOMNIA, CHRONIC 05/13/2007   HYPERLIPIDEMIA 01/13/2007   ANXIETY DEPRESSION 01/13/2007   SYNDROME, CHRONIC PAIN 01/13/2007   CVA 01/13/2007   ARTHRITIS 01/13/2007   Virgina Organ, PT CLT [id number]  03/05/2022, 9:40 AM  [facility] [address] [place], [place], [address] Phone: [phone]   Fax:  [phone]  Name: WYLAND GANTT MRN: [id number] Date of Birth: [birth year]

## 2022-03-07 ENCOUNTER — Ambulatory Visit (HOSPITAL_COMMUNITY): Payer: Medicaid Other | Admitting: Physical Therapy

## 2022-03-07 DIAGNOSIS — R2689 Other abnormalities of gait and mobility: Secondary | ICD-10-CM

## 2022-03-07 DIAGNOSIS — M6281 Muscle weakness (generalized): Secondary | ICD-10-CM

## 2022-03-07 DIAGNOSIS — T8149XA Infection following a procedure, other surgical site, initial encounter: Secondary | ICD-10-CM

## 2022-03-07 DIAGNOSIS — S81801A Unspecified open wound, right lower leg, initial encounter: Secondary | ICD-10-CM

## 2022-03-07 NOTE — Addendum Note (Signed)
Addended by: Mearl Latin on: 03/07/2022 09:29 AM   Modules accepted: Orders

## 2022-03-07 NOTE — Therapy (Addendum)
[facility] [place], [place], [address] Phone: [phone]   Fax:  [phone]  Wound Care Therapy  Patient Details  Name: Danny [name] MRN: [id number] Date of Birth: 14-Jul-1966 Referring Provider (PT): Arther Abbott MD   Encounter Date: 03/07/2022  PHYSICAL THERAPY DISCHARGE SUMMARY  Visits from Start of Care: 25  Current functional level related to goals / functional outcomes: Patient has not met goals due to continued wound present and weakness. Patient will be following up with a vascular surgeon and has exhausted most of PT authorized visits for the year.    Remaining deficits: Continued wounds and weakness   Education / Equipment: HEP, wound care   Patient agrees to discharge. Patient goals were not met. Patient is being discharged due to  limited insurance auth.  9:07 AM, 04/01/22 Mearl Latin PT, DPT Physical Therapist at [facility]   PT End of Session - 03/07/22 1020     Visit Number 25    Number of Visits 32    Date for PT Re-Evaluation 03/21/22    Authorization Type Medicaid - [employer].  Requested additional visits 01/31/22    Authorization Time Period [employer] approved 12 visits from 11/21/2021-03/21/2022 then an additional 12 visits from 4/21 thru 7/13; 8 visits requested 01/31/22-02/28/22 - 8 visits approved 3/15-7/13    Authorization - Visit Number 25    Authorization - Number of Visits 27    Progress Note Due on Visit 75    PT Start Time 0830    PT Stop Time 0915    PT Time Calculation (min) 45 min             Past Medical History:  Diagnosis Date   Alcohol abuse    Anxiety    Back pain    CVA (cerebral infarction)    Depression    Diabetes mellitus    Hypertension    Migraines    Stroke [facility])     Past Surgical History:  Procedure Laterality Date   CAST APPLICATION Right 1/51/7616   Procedure: CAST APPLICATION;  Surgeon: Carole Civil, MD;   Location: [facility];  Service: Orthopedics;  Laterality: Right;   FOOT SURGERY     INCISION AND DRAINAGE Right 11/02/2021   Procedure: INCISION AND DRAINAGE RIGHT KNEE;  Surgeon: Carole Civil, MD;  Location: [facility];  Service: Orthopedics;  Laterality: Right;   IR 3D INDEPENDENT WKST  10/11/2021   IR 3D INDEPENDENT WKST  02/28/2022   IR ANGIOGRAM SELECTIVE EACH ADDITIONAL VESSEL  10/11/2021   IR ANGIOGRAM SELECTIVE EACH ADDITIONAL VESSEL  10/11/2021   IR ANGIOGRAM SELECTIVE EACH ADDITIONAL VESSEL  10/11/2021   IR ANGIOGRAM SELECTIVE EACH ADDITIONAL VESSEL  10/11/2021   IR ANGIOGRAM SELECTIVE EACH ADDITIONAL VESSEL  10/11/2021   IR ANGIOGRAM SELECTIVE EACH ADDITIONAL VESSEL  10/11/2021   IR ANGIOGRAM SELECTIVE EACH ADDITIONAL VESSEL  10/25/2021   IR ANGIOGRAM SELECTIVE EACH ADDITIONAL VESSEL  10/25/2021   IR ANGIOGRAM SELECTIVE EACH ADDITIONAL VESSEL  02/28/2022   IR ANGIOGRAM SELECTIVE EACH ADDITIONAL VESSEL  02/28/2022   IR ANGIOGRAM SELECTIVE EACH ADDITIONAL VESSEL  02/28/2022   IR ANGIOGRAM VISCERAL SELECTIVE  10/11/2021   IR ANGIOGRAM VISCERAL SELECTIVE  02/28/2022   IR EMBO TUMOR ORGAN ISCHEMIA INFARCT INC GUIDE ROADMAPPING  10/25/2021   IR EMBO TUMOR ORGAN ISCHEMIA INFARCT INC GUIDE ROADMAPPING  10/25/2021   IR RADIOLOGIST EVAL & MGMT  08/23/2021   IR RADIOLOGIST EVAL &  MGMT  11/21/2021   IR RADIOLOGIST EVAL & MGMT  01/24/2022   IR US GUIDE VASC ACCESS RIGHT  10/11/2021   IR US GUIDE VASC ACCESS RIGHT  10/25/2021   IR US GUIDE VASC ACCESS RIGHT  02/28/2022   MANDIBLE SURGERY     QUADRICEPS TENDON REPAIR Right 09/04/2021   Procedure: REPAIR QUADRICEP TENDON;  Surgeon: Carole Civil, MD;  Location: [facility];  Service: Orthopedics;  Laterality: Right;   TENDON EXPLORATION Right 11/02/2021   Procedure: QUADRICEPS TENDON AND PATELLAR TENDON EXPLORATION;  Surgeon: Carole Civil, MD;  Location: [facility];  Service: Orthopedics;  Laterality: Right;    There were no vitals filed for this  visit.               Wound Therapy - 03/07/22 1005     Subjective pt reports he's still not feeling great, doing as good as he can on his exercises.  Pt aware that next visit will be his last per his insurance restrictions    Patient and Family Stated Goals wound to heal    Date of Onset 09/04/21    Prior Treatments dressing changes at MD office    Pain Scale 0-10    Pain Score 4     Pain Type Acute pain    Pain Location Leg    Pain Orientation Right    Pain Descriptors / Indicators Sore    Evaluation and Treatment Procedures Explained to Patient/Family Yes    Evaluation and Treatment Procedures agreed to    Wound Properties Date First Assessed: 12/04/21 Time First Assessed: 1010 Location: Pretibial Location Orientation: Proximal;Right Present on Admission: -- , unsure    Wound Image Images linked: 1    Dressing Type Gauze (Comment)    Dressing Changed Changed    Dressing Status Old drainage    Dressing Change Frequency PRN    Site / Wound Assessment Bleeding;Granulation tissue;Yellow    % Wound base Red or Granulating 90%    % Wound base Yellow/Fibrinous Exudate 10%    Peri-wound Assessment Erythema (blanchable)    Wound Length (cm) 3.3 cm    Wound Width (cm) 2.6 cm    Wound Surface Area (cm^2) 8.58 cm^2    Margins Unattached edges (unapproximated)    Drainage Amount Minimal    Drainage Description Serosanguineous    Treatment Cleansed;Debridement (Selective)    Wound Properties Date First Assessed: 12/04/21 Location: Toe (Comment  which one) Location Orientation: Anterior;Right Wound Description (Comments): Great Toe Present on Admission: No   Wound Image Images linked: 1    Dressing Type Gauze (Comment);Impregnated gauze (bismuth)    Dressing Status Intact    Dressing Change Frequency PRN    Site / Wound Assessment Pink;Yellow    % Wound base Red or Granulating 95%    % Wound base Yellow/Fibrinous Exudate 5%    Peri-wound Assessment Erythema (blanchable)     Wound Length (cm) 0.1 cm    Wound Width (cm) 0.2 cm    Wound Surface Area (cm^2) 0.02 cm^2    Margins Unattached edges (unapproximated)    Treatment Cleansed;Debridement (Selective)    Wound Properties Date First Assessed: 03/07/22 Time First Assessed: 0840 Wound Type: Other (Comment) Final Assessment Date: 03/07/22 Final Assessment Time: 1016   Wound Properties Date First Assessed: 03/07/22 Time First Assessed: 0840 Wound Type: Other (Comment) Location: Toe (Comment  which one) Location Orientation: Right;Medial Wound Description (Comments): medial Rt great toe Present on Admission: No   Wound Image  Images linked: 1    Dressing Type Impregnated gauze (bismuth)    Dressing Changed Changed    Dressing Status Clean, Dry, Intact    Dressing Change Frequency PRN    Site / Wound Assessment Red;Pink;Yellow    % Wound base Red or Granulating 50%    % Wound base Yellow/Fibrinous Exudate 50%    Peri-wound Assessment Intact    Wound Length (cm) 0.8 cm    Wound Width (cm) 0.5 cm    Wound Surface Area (cm^2) 0.4 cm^2    Drainage Amount Minimal    Drainage Description Serous    Treatment Cleansed;Debridement (Selective)    Wound Properties Date First Assessed: 12/07/21 Wound Type: Other (Comment) Location: Ankle Location Orientation: Posterior Wound Description (Comments): inferior wound posterior ankle Present on Admission: No Final Assessment Date: 03/07/22   Wound Properties Date First Assessed: 12/07/21 Wound Type: Other (Comment) Location: Ankle Location Orientation: Posterior Wound Description (Comments): wound superior posterior ankle Present on Admission: No Final Assessment Date: 03/07/22 Final Assessment Time: 1016   Selective Debridement - Location Anterior leg wound and toe wound    Selective Debridement - Tools Used Forceps    Selective Debridement - Tissue Removed slough    Wound Therapy - Clinical Statement Discussed next session being last and pt aware; suggested he bring [place] next  session to ensure she has no questions on bandaging remaining wounds.  Educated to work diligently on his quad strengthening as he has not been feeling up to it lately.  Noted open wound on medial side of great toe in addition to the one on dorsal aspect.  Shin wound measrued today with increase in size as compared to last session, however no foul drainage as last visit but does have a halo of erythemia around wound instructed to watch for infection.  Continued with medihoney on shin and xerofrom on great toe.  Pt reported overall comfort with bandaging this session.    Wound Therapy - Functional Problem List gait, bathing, dressing    Factors Delaying/Impairing Wound Healing Diabetes Mellitus;Immobility;Multiple medical problems;Tobacco use    Hydrotherapy Plan Debridement;Dressing change;Electrical stimulation;Patient/family education;Pulsatile lavage with suction    Wound Therapy - Frequency 2X / week    Wound Therapy - Current Recommendations PT    Wound Plan Continue with debridement and dressing changes.  measure and photograph weekly.  Continue LE strengthening exericses.    Dressing  Anterior leg dressed with medihoney 4x4 and kerlix; great toe bandaid.    Dressing great toe: xeroform, 2" conform                       PT Short Term Goals - 02/26/22 1851       PT SHORT TERM GOAL #1   Title Patient wound will be free from slough to demonstrate wound healing.    Status On-going               PT Long Term Goals - 02/26/22 1852       PT LONG TERM GOAL #1   Title Patient wound to be healed to reduce risk of infection.    Status On-going      PT LONG TERM GOAL #2   Title Paitent will be able to transition to formal PT for knee rehab to improve functional mobility and strength.    Status On-going      PT LONG TERM GOAL #3   Title PT to be walking with least assistive device  Baseline 02/26/22:  unable to count number of falls, encouraged to continue wtih RW at all  times for fall prevention    Status On-going      PT LONG TERM GOAL #4   Title Patient will demonstrate at least 4/5 R knee extension for improve gait mechanics.    Baseline 02/26/22: Quad 1/5    Status On-going                    Patient will benefit from skilled therapeutic intervention in order to improve the following deficits and impairments:     Visit Diagnosis: Cellulitis, wound, post-operative  Multiple open wounds of right lower extremity, initial encounter  Other abnormalities of gait and mobility  Muscle weakness (generalized)     Problem List Patient Active Problem List   Diagnosis Date Noted   Wound infection after surgery 09/27/2021   Postoperative wound cellulitis 09/26/2021   Rupture of right quadriceps tendon    Chronic hepatitis C without hepatic coma ([place]) 08/08/2021   Hepatocellular carcinoma ([place]) 08/08/2021   COPD (chronic obstructive pulmonary disease) ([place]) 08/08/2021   Hypertension 08/08/2021   Other cirrhosis of liver ([place]) 08/08/2021   Type 2 diabetes mellitus ([place]) 08/08/2021   Generalized abdominal pain 06/06/2021   Dysphagia 06/06/2021   Gastroesophageal reflux disease 06/06/2021   Abnormal liver ultrasound 06/06/2021   Colon cancer screening 06/06/2021   Elevated LFTs 06/06/2021   GREATER TROCHANTERIC BURSITIS 07/03/2007   INCREASED BLOOD PRESSURE 07/03/2007   BACK PAIN, LUMBAR 05/29/2007   NUMBNESS 05/29/2007   INSOMNIA, CHRONIC 05/13/2007   HYPERLIPIDEMIA 01/13/2007   ANXIETY DEPRESSION 01/13/2007   SYNDROME, CHRONIC PAIN 01/13/2007   CVA 01/13/2007   ARTHRITIS 01/13/2007   Teena Irani, PTA/CLT [place] Ph: [phone]  Veva Holes 03/07/2022, 10:21 AM  [place] [address] [place], [place], [address] Phone: [phone]   Fax:  [phone]  Name: BROOX LONIGRO MRN: [id number] Date of Birth:  [birth year]

## 2022-03-10 ENCOUNTER — Other Ambulatory Visit: Payer: Self-pay | Admitting: Orthopedic Surgery

## 2022-03-10 DIAGNOSIS — Z79899 Other long term (current) drug therapy: Secondary | ICD-10-CM | POA: Insufficient documentation

## 2022-03-10 DIAGNOSIS — N1831 Chronic kidney disease, stage 3a: Secondary | ICD-10-CM | POA: Insufficient documentation

## 2022-03-11 ENCOUNTER — Ambulatory Visit (HOSPITAL_COMMUNITY): Payer: Medicaid Other | Admitting: Physical Therapy

## 2022-03-11 MED ORDER — OXYCODONE-ACETAMINOPHEN 10-325 MG PO TABS: 1.0000 | ORAL_TABLET | ORAL | 0 refills | Status: AC | PRN

## 2022-03-13 ENCOUNTER — Other Ambulatory Visit: Payer: Self-pay | Admitting: Student

## 2022-03-13 DIAGNOSIS — C220 Liver cell carcinoma: Secondary | ICD-10-CM

## 2022-03-14 ENCOUNTER — Other Ambulatory Visit: Payer: Self-pay

## 2022-03-14 ENCOUNTER — Ambulatory Visit (HOSPITAL_COMMUNITY)
Admission: RE | Admit: 2022-03-14 | Discharge: 2022-03-14 | Disposition: A | Discharge status: Home / Self Care | Payer: Medicaid Other | Source: Ambulatory Visit | Attending: Interventional Radiology | Admitting: Interventional Radiology

## 2022-03-14 ENCOUNTER — Other Ambulatory Visit (HOSPITAL_COMMUNITY): Payer: Self-pay | Admitting: Interventional Radiology

## 2022-03-14 ENCOUNTER — Encounter (HOSPITAL_COMMUNITY): Payer: Self-pay

## 2022-03-14 ENCOUNTER — Encounter (HOSPITAL_COMMUNITY)
Admission: RE | Admit: 2022-03-14 | Discharge: 2022-03-14 | Disposition: A | Discharge status: Home / Self Care | Payer: Medicaid Other | Source: Ambulatory Visit | Attending: Interventional Radiology | Admitting: Interventional Radiology

## 2022-03-14 ENCOUNTER — Ambulatory Visit (HOSPITAL_COMMUNITY): Payer: Medicaid Other | Admitting: Physical Therapy

## 2022-03-14 DIAGNOSIS — K7030 Alcoholic cirrhosis of liver without ascites: Secondary | ICD-10-CM | POA: Diagnosis not present

## 2022-03-14 DIAGNOSIS — F17210 Nicotine dependence, cigarettes, uncomplicated: Secondary | ICD-10-CM | POA: Diagnosis not present

## 2022-03-14 DIAGNOSIS — E119 Type 2 diabetes mellitus without complications: Secondary | ICD-10-CM | POA: Diagnosis not present

## 2022-03-14 DIAGNOSIS — Z8673 Personal history of transient ischemic attack (TIA), and cerebral infarction without residual deficits: Secondary | ICD-10-CM | POA: Insufficient documentation

## 2022-03-14 DIAGNOSIS — C220 Liver cell carcinoma: Secondary | ICD-10-CM

## 2022-03-14 DIAGNOSIS — Z7984 Long term (current) use of oral hypoglycemic drugs: Secondary | ICD-10-CM | POA: Diagnosis not present

## 2022-03-14 HISTORY — PX: IR EMBO TUMOR ORGAN ISCHEMIA INFARCT INC GUIDE ROADMAPPING: IMG5449

## 2022-03-14 HISTORY — PX: IR ANGIOGRAM SELECTIVE EACH ADDITIONAL VESSEL: IMG667

## 2022-03-14 HISTORY — PX: IR ANGIOGRAM VISCERAL SELECTIVE: IMG657

## 2022-03-14 HISTORY — PX: IR US GUIDE VASC ACCESS RIGHT: IMG2390

## 2022-03-14 HISTORY — PX: IR 3D INDEPENDENT WKST: IMG2385

## 2022-03-14 LAB — CBC WITH DIFFERENTIAL/PLATELET
Abs Immature Granulocytes: 0.01 10*3/uL (ref 0.00–0.07)
Basophils Absolute: 0.1 10*3/uL (ref 0.0–0.1)
Basophils Relative: 1 %
Eosinophils Absolute: 0.2 10*3/uL (ref 0.0–0.5)
Eosinophils Relative: 4 %
HCT: 39.8 % (ref 39.0–52.0)
Hemoglobin: 13.5 g/dL (ref 13.0–17.0)
Immature Granulocytes: 0 %
Lymphocytes Relative: 16 %
Lymphs Abs: 0.9 10*3/uL (ref 0.7–4.0)
MCH: 31.3 pg (ref 26.0–34.0)
MCHC: 33.9 g/dL (ref 30.0–36.0)
MCV: 92.1 fL (ref 80.0–100.0)
Monocytes Absolute: 0.5 10*3/uL (ref 0.1–1.0)
Monocytes Relative: 8 %
Neutro Abs: 4 10*3/uL (ref 1.7–7.7)
Neutrophils Relative %: 71 %
Platelets: 128 10*3/uL — ABNORMAL LOW (ref 150–400)
RBC: 4.32 MIL/uL (ref 4.22–5.81)
RDW: 13.4 % (ref 11.5–15.5)
WBC: 5.6 10*3/uL (ref 4.0–10.5)
nRBC: 0 % (ref 0.0–0.2)

## 2022-03-14 LAB — GLUCOSE, CAPILLARY: Glucose-Capillary: 140 mg/dL — ABNORMAL HIGH (ref 70–99)

## 2022-03-14 LAB — COMPREHENSIVE METABOLIC PANEL
ALT: 69 U/L — ABNORMAL HIGH (ref 0–44)
AST: 77 U/L — ABNORMAL HIGH (ref 15–41)
Albumin: 3.9 g/dL (ref 3.5–5.0)
Alkaline Phosphatase: 146 U/L — ABNORMAL HIGH (ref 38–126)
Anion gap: 8 (ref 5–15)
BUN: 27 mg/dL — ABNORMAL HIGH (ref 6–20)
CO2: 21 mmol/L — ABNORMAL LOW (ref 22–32)
Calcium: 9.5 mg/dL (ref 8.9–10.3)
Chloride: 108 mmol/L (ref 98–111)
Creatinine, Ser: 1.25 mg/dL — ABNORMAL HIGH (ref 0.61–1.24)
GFR, Estimated: >60 mL/min (ref >60)
Glucose, Bld: 140 mg/dL — ABNORMAL HIGH (ref 70–99)
Potassium: 4.1 mmol/L (ref 3.5–5.1)
Sodium: 137 mmol/L (ref 135–145)
Total Bilirubin: 0.9 mg/dL (ref 0.3–1.2)
Total Protein: 7.8 g/dL (ref 6.5–8.1)

## 2022-03-14 MED ORDER — SODIUM CHLORIDE 0.9 % IV SOLN
2.0000 g | INTRAVENOUS | Status: AC
  Administered 2022-03-14: 2 g via INTRAVENOUS
  Filled 2022-03-14: qty 2

## 2022-03-14 MED ORDER — YTTRIUM 90 INJECTION: 17.8000 | INJECTION | Freq: Once | INTRAVENOUS | Status: DC

## 2022-03-14 MED ORDER — MIDAZOLAM HCL 2 MG/2ML IJ SOLN
INTRAMUSCULAR | Status: AC
  Filled 2022-03-14: qty 2

## 2022-03-14 MED ORDER — SODIUM CHLORIDE 0.9 % IV SOLN: INTRAVENOUS | Status: DC

## 2022-03-14 MED ORDER — IOHEXOL 300 MG/ML  SOLN
100.0000 mL | Freq: Once | INTRAMUSCULAR | Status: AC | PRN
  Administered 2022-03-14: 30 mL via INTRA_ARTERIAL

## 2022-03-14 MED ORDER — MIDAZOLAM HCL 2 MG/2ML IJ SOLN
INTRAMUSCULAR | Status: AC | PRN
  Administered 2022-03-14: 1 mg via INTRAVENOUS

## 2022-03-14 MED ORDER — SODIUM CHLORIDE 0.9 % IV SOLN
8.0000 mg | Freq: Once | INTRAVENOUS | Status: AC
  Administered 2022-03-14: 8 mg via INTRAVENOUS
  Filled 2022-03-14: qty 4

## 2022-03-14 MED ORDER — AMOXICILLIN-POT CLAVULANATE 875-125 MG PO TABS: 1.0000 | ORAL_TABLET | Freq: Two times a day (BID) | ORAL | 0 refills | Status: AC

## 2022-03-14 MED ORDER — IOHEXOL 300 MG/ML  SOLN
100.0000 mL | Freq: Once | INTRAMUSCULAR | Status: AC | PRN
  Administered 2022-03-14: 40 mL via INTRA_ARTERIAL

## 2022-03-14 MED ORDER — LIDOCAINE-EPINEPHRINE 1 %-1:100000 IJ SOLN
INTRAMUSCULAR | Status: AC | PRN
  Administered 2022-03-14: 10 mL via INTRADERMAL

## 2022-03-14 MED ORDER — PANTOPRAZOLE SODIUM 40 MG IV SOLR
40.0000 mg | Freq: Once | INTRAVENOUS | Status: AC
  Administered 2022-03-14: 40 mg via INTRAVENOUS
  Filled 2022-03-14: qty 10

## 2022-03-14 MED ORDER — IOHEXOL 300 MG/ML  SOLN
100.0000 mL | Freq: Once | INTRAMUSCULAR | Status: AC | PRN
Start: 2022-03-14 — End: 2022-03-14
  Administered 2022-03-14: 30 mL via INTRA_ARTERIAL

## 2022-03-14 MED ORDER — DEXAMETHASONE SODIUM PHOSPHATE 10 MG/ML IJ SOLN
8.0000 mg | Freq: Once | INTRAMUSCULAR | Status: AC
  Administered 2022-03-14: 8 mg via INTRAVENOUS
  Filled 2022-03-14: qty 1

## 2022-03-14 MED ORDER — FENTANYL CITRATE (PF) 100 MCG/2ML IJ SOLN
INTRAMUSCULAR | Status: AC | PRN
  Administered 2022-03-14: 50 ug via INTRAVENOUS

## 2022-03-14 MED ORDER — LIDOCAINE-EPINEPHRINE 1 %-1:100000 IJ SOLN
INTRAMUSCULAR | Status: AC
  Filled 2022-03-14: qty 1

## 2022-03-14 MED ORDER — FENTANYL CITRATE (PF) 100 MCG/2ML IJ SOLN
INTRAMUSCULAR | Status: AC
  Filled 2022-03-14: qty 4

## 2022-03-14 NOTE — Discharge Instructions (Addendum)
Please call Interventional Radiology clinic [phone] with any questions or concerns.  You may remove your dressing and shower tomorrow.    Hepatic Artery Radioembolization, Care After The following information offers guidance on how to care for yourself after your procedure. Your health care provider may also give you more specific instructions. If you have problems or questions, contact your health care provider. What can I expect after the procedure? After the procedure, it is possible to have: A slight fever for 7 to 10 days. This may be accompanied by pain, nausea, or vomiting, which is referred to as post-embolization syndrome. You may be given medicine to help relieve these symptoms. If your fever gets worse, tell your health care provider. Tiredness (fatigue). Loss of appetite. This should gradually improve after about 1 week. Abdominal pain on your right side. Soreness and tenderness in your groin area where the needle and catheter were placed (puncture site). Follow these instructions at home: Puncture site care Follow instructions from your health care provider about how to take care of the puncture site. Make sure you: Wash your hands with soap and water for at least 20 seconds before and after you change your bandage (dressing). If soap and water are not available, use hand sanitizer. Change your dressing as told by your health care provider. Check your puncture site every day for signs of infection. Check for: More redness, swelling, or pain. Fluid or blood. Warmth. Pus or a bad smell. Activity Rest as told by your health care provider. Return to your normal activities as told by your health care provider. Ask your health care provider what activities are safe for you. Avoid sitting for a long time without moving. Get up to take short walks every 1-2 hours. This is important to improve blood flow and breathing. Ask for help if you feel weak or unsteady. If you were given  a sedative during the procedure, it can affect you for several hours. Do not drive or operate machinery until your health care provider says that it is safe. Do not lift anything that is heavier than 10 lb (4.5 kg), or the limit that you are told, until your health care provider says that it is safe. Medicines Take over-the-counter and prescription medicines only as told by your health care provider. Ask your health care provider if the medicine prescribed to you: Requires you to avoid driving or using machinery. Can cause constipation. You may need to take these actions to prevent or treat constipation: Drink enough fluid to keep your urine pale yellow. Take over-the-counter or prescription medicines. Eat foods that are high in fiber, such as beans, whole grains, and fresh fruits and vegetables. Limit foods that are high in fat and processed sugars, such as fried or sweet foods. General instructions Eat frequent, small meals until your appetite returns. Follow instructions from your health care provider about eating or drinking restrictions. Do not take baths, swim, or use a hot tub until your health care provider approves. You may take showers. Wash your puncture site with mild soap and water, and pat the area dry. Wear compression stockings as told by your health care provider. These stockings help to prevent blood clots and reduce swelling in your legs. Keep all follow-up visits. This is important. You may need to have blood tests and imaging tests. Contact a health care provider if: You have any of these signs of infection: More redness, swelling, or pain around your puncture site. Fluid or blood coming from your   puncture site. Warmth coming from your puncture site. Pus or a bad smell coming from your puncture site. You have pain that: Gets worse. Does not get better with medicine. Feels like very bad heartburn. Is in the middle of your abdomen, above your belly button. You have any  signs of infection or liver failure, such as: Your skin or the white parts of your eyes turn yellow (jaundice). The color of your urine changes to dark brown. The color of your stool (feces) changes to light yellow. Your abdominal measurement (girth) increases in a short period of time. You gain more than 5 lb (2.3 kg) in a short period of time. Get help right away if: You have a fever that lasts more than 10 days or is higher than what your health care provider told you to expect. You develop any of the following in your legs: Pain. Swelling. Skin that is cold or pale or turns blue. You have chest pain. You have blood in your vomit, saliva, or stool. You have trouble breathing. These symptoms may represent a serious problem that is an emergency. Do not wait to see if the symptoms will go away. Get medical help right away. Call your local emergency services (911 in the [place] Do not drive yourself to the hospital. Summary After the procedure, it is possible to have a slight fever for up to 7-10 days, tiredness, loss of appetite, abdominal pain on the right side, and groin tenderness where the catheter was placed. Do not come in close contact with people for up to a week after your procedure, as told by your health care provider. Follow instructions from your health care provider about how to take care of the puncture site. Contact a health care provider if you have any signs of infection. Get help right away if you develop pain or swelling in your legs or if your legs feel cool or look pale. This information is not intended to replace advice given to you by your health care provider. Make sure you discuss any questions you have with your health care provider. Document Revised: 07/30/2020 Document Reviewed: 07/30/2020 [employer] Patient Education  2022 [employer]      Moderate Conscious Sedation, Adult, Care After This sheet gives you information about how to care for yourself after  your procedure. Your health care provider may also give you more specific instructions. If you have problems or questions, contact your health care provider. What can I expect after the procedure? After the procedure, it is common to have: Sleepiness for several hours. Impaired judgment for several hours. Difficulty with balance. Vomiting if you eat too soon. Follow these instructions at home: For the time period you were told by your health care provider: Rest. Do not participate in activities where you could fall or become injured. Do not drive or use machinery. Do not drink alcohol. Do not take sleeping pills or medicines that cause drowsiness. Do not make important decisions or sign legal documents. Do not take care of children on your own.      Eating and drinking Follow the diet recommended by your health care provider. Drink enough fluid to keep your urine pale yellow. If you vomit: Drink water, juice, or soup when you can drink without vomiting. Make sure you have little or no nausea before eating solid foods.   General instructions Take over-the-counter and prescription medicines only as told by your health care provider. Have a responsible adult stay with you for the time   you are told. It is important to have someone help care for you until you are awake and alert. Do not smoke. Keep all follow-up visits as told by your health care provider. This is important. Contact a health care provider if: You are still sleepy or having trouble with balance after 24 hours. You feel light-headed. You keep feeling nauseous or you keep vomiting. You develop a rash. You have a fever. You have redness or swelling around the IV site. Get help right away if: You have trouble breathing. You have new-onset confusion at home. Summary After the procedure, it is common to feel sleepy, have impaired judgment, or feel nauseous if you eat too soon. Rest after you get home. Know the things you  should not do after the procedure. Follow the diet recommended by your health care provider and drink enough fluid to keep your urine pale yellow. Get help right away if you have trouble breathing or new-onset confusion at home. This information is not intended to replace advice given to you by your health care provider. Make sure you discuss any questions you have with your health care provider. Document Revised: 12/24/2019 Document Reviewed: 07/22/2019 [employer] Patient Education  2021 [employer]     RADIATION PRECAUTIONS:  For up to a week after your procedure, there will be a small amount of radioactivity near your liver. This is not especially dangerous to other people. However, as told by your health care provider, you should follow these precautions for 7 days: Do not come in close contact with people. Do not sleep in the same bed as someone else. Do not hold children or babies. Do not have contact with pregnant women.  Post Y-90 Radioembolization Discharge Instructions  You have been given a radioactive material during your procedure.  While it is safe for you to be discharged home from the hospital, you need to proceed directly home.    Do not use public transportation, including air travel, lasting more than 2 hours for 1 week.  Avoid crowded public places for 1 week.  Adult visitors should try to avoid close contact with you for 1 week.    Children and pregnant females should not visit or have close contact with you for 1 week.  Items that you touch are not radioactive.  Do not sleep in the same bed as your partner for 1 week, and a condom should be used for sexual activity during the first 24 hours.  Your blood may be radioactive and caution should be used if any bleeding occurs during the recovery period.  Body fluids may be radioactive for 24 hours.  Wash your hands after voiding.  Men should sit to urinate.  Dispose of any soiled materials (flush down toilet or  place in trash at home) during the first day.  Drink 6 to 8 glasses of fluids per day for 5 days to hydrate yourself.  If you need to see a doctor during the first week, you must let them know that you were treated with yttrium-90 microspheres, and will be slightly radioactive.  They can call Interventional Radiology [phone] with any questions.  

## 2022-03-14 NOTE — H&P (Signed)
Chief Complaint: Patient was seen in consultation today for Y-90 radioembolization of hepatocellular carcinoma  Supervising Physician: Ruthann Cancer  Patient Status: Danny [name]  History of Present Illness: Danny [name] is a 56 y.o. male with PMH of DM, migraines, alcoholic cirrhosis, stroke, and multifocal hepatocellular carcinoma. Pt previously underwent left hepatic TARE on 10/25/21 with Dr Serafina Royals. During his follow-up with Dr Serafina Royals, Y-90 treatment was discussed for treating his right lobe [place]. Pt underwent pre-Y90 treatment on 02/28/22 with Dr Serafina Royals and pt presents today for Y-90 treatment.  Past Medical History:  Diagnosis Date   Alcohol abuse    Anxiety    Back pain    CVA (cerebral infarction)    Depression    Diabetes mellitus    Hypertension    Migraines    Stroke [facility])     Past Surgical History:  Procedure Laterality Date   CAST APPLICATION Right 5/59/7416   Procedure: CAST APPLICATION;  Surgeon: Carole Civil, MD;  Location: [facility];  Service: Orthopedics;  Laterality: Right;   FOOT SURGERY     INCISION AND DRAINAGE Right 11/02/2021   Procedure: INCISION AND DRAINAGE RIGHT KNEE;  Surgeon: Carole Civil, MD;  Location: [facility];  Service: Orthopedics;  Laterality: Right;   IR 3D INDEPENDENT WKST  10/11/2021   IR 3D INDEPENDENT WKST  02/28/2022   IR ANGIOGRAM SELECTIVE EACH ADDITIONAL VESSEL  10/11/2021   IR ANGIOGRAM SELECTIVE EACH ADDITIONAL VESSEL  10/11/2021   IR ANGIOGRAM SELECTIVE EACH ADDITIONAL VESSEL  10/11/2021   IR ANGIOGRAM SELECTIVE EACH ADDITIONAL VESSEL  10/11/2021   IR ANGIOGRAM SELECTIVE EACH ADDITIONAL VESSEL  10/11/2021   IR ANGIOGRAM SELECTIVE EACH ADDITIONAL VESSEL  10/11/2021   IR ANGIOGRAM SELECTIVE EACH ADDITIONAL VESSEL  10/25/2021   IR ANGIOGRAM SELECTIVE EACH ADDITIONAL VESSEL  10/25/2021   IR ANGIOGRAM SELECTIVE EACH ADDITIONAL VESSEL  02/28/2022   IR ANGIOGRAM SELECTIVE EACH ADDITIONAL VESSEL  02/28/2022   IR ANGIOGRAM SELECTIVE EACH  ADDITIONAL VESSEL  02/28/2022   IR ANGIOGRAM VISCERAL SELECTIVE  10/11/2021   IR ANGIOGRAM VISCERAL SELECTIVE  02/28/2022   IR EMBO TUMOR ORGAN ISCHEMIA INFARCT INC GUIDE ROADMAPPING  10/25/2021   IR EMBO TUMOR ORGAN ISCHEMIA INFARCT INC GUIDE ROADMAPPING  10/25/2021   IR RADIOLOGIST EVAL & MGMT  08/23/2021   IR RADIOLOGIST EVAL & MGMT  11/21/2021   IR RADIOLOGIST EVAL & MGMT  01/24/2022   IR US GUIDE VASC ACCESS RIGHT  10/11/2021   IR US GUIDE VASC ACCESS RIGHT  10/25/2021   IR US GUIDE VASC ACCESS RIGHT  02/28/2022   MANDIBLE SURGERY     QUADRICEPS TENDON REPAIR Right 09/04/2021   Procedure: REPAIR QUADRICEP TENDON;  Surgeon: Carole Civil, MD;  Location: [facility];  Service: Orthopedics;  Laterality: Right;   TENDON EXPLORATION Right 11/02/2021   Procedure: QUADRICEPS TENDON AND PATELLAR TENDON EXPLORATION;  Surgeon: Carole Civil, MD;  Location: [facility];  Service: Orthopedics;  Laterality: Right;    Allergies: Ultram [tramadol] and Benadryl [diphenhydramine]  Medications: Prior to Admission medications   Medication Sig Start Date End Date Taking? Authorizing Provider  albuterol (VENTOLIN HFA) 108 (90 Base) MCG/ACT inhaler Inhale 2 puffs into the lungs every 6 (six) hours as needed for wheezing or shortness of breath.   Yes [provider]  Cholecalciferol (VITAMIN D-3) 125 MCG (5000 UT) TABS Take 5,000 Units by mouth daily.   Yes [provider]  citalopram (CELEXA) 20 MG tablet Take 20 mg by mouth  daily.   Yes [provider]  gabapentin (NEURONTIN) 600 MG tablet Take 600 mg by mouth in the morning and at bedtime. 06/24/21  Yes [provider]  meloxicam (MOBIC) 7.5 MG tablet Take 7.5 mg by mouth in the morning and at bedtime.   Yes [provider]  metFORMIN (GLUCOPHAGE) 500 MG tablet Take 1 tablet (500 mg total) by mouth 2 (two) times daily with a meal. 01/01/18  Yes Julianne Rice, MD  omeprazole (PRILOSEC) 40 MG capsule Take 1 capsule  (40 mg total) by mouth daily before breakfast. 06/06/21  Yes Erenest Rasher, PA-C  oxyCODONE-acetaminophen (PERCOCET) 10-325 MG tablet Take 1 tablet by mouth every 4 (four) hours as needed for pain. 02/22/22  Yes Carole Civil, MD  QUEtiapine (SEROQUEL) 100 MG tablet Take 100 mg by mouth at bedtime.   Yes [provider]  sulfamethoxazole-trimethoprim (BACTRIM DS) 800-160 MG tablet Take 1 tablet by mouth 2 (two) times daily. 01/14/22  Yes Carole Civil, MD  hydrOXYzine (ATARAX) 25 MG tablet Take 25 mg by mouth 3 (three) times daily as needed for anxiety.    [provider]  oxyCODONE-acetaminophen (PERCOCET) 10-325 MG tablet Take 1 tablet by mouth every 4 (four) hours as needed for up to 5 days for pain. 03/11/22 03/16/22  Carole Civil, MD     Family History  Problem Relation Age of Onset   Heart failure Mother    Stroke Father    Stroke Sister    Cancer Sister        breast cancer   Colon cancer Neg Hx    Liver cancer Neg Hx     Social History   Socioeconomic History   Marital status: Legally Separated    Spouse name: Not on file   Number of children: Not on file   Years of education: Not on file   Highest education level: Not on file  Occupational History   Not on file  Tobacco Use   Smoking status: Every Day    Packs/day: 2.00    Years: 20.00    Total pack years: 40.00    Types: Cigarettes   Smokeless tobacco: Never  Vaping Use   Vaping Use: Never used  Substance and Sexual Activity   Alcohol use: Not Currently    Comment: Quit 11 months ago. Used to drink about 6 pack a day. (documented 06/06/21)   Drug use: Yes    Types: Marijuana    Comment: occas   Sexual activity: Yes    Birth control/protection: None  Other Topics Concern   Not on file  Social History Narrative   Not on file   Social Determinants of Health   Financial Resource Strain: Not on file  Food Insecurity: Not on file  Transportation Needs: Not on file  Physical  Activity: Not on file  Stress: Not on file  Social Connections: Not on file     Review of Systems: A 12 point ROS discussed and pertinent positives are indicated in the HPI above.  All other systems are negative.  Review of Systems  Constitutional:  Negative for chills, fatigue and fever.  Respiratory:  Negative for chest tightness and shortness of breath.   Cardiovascular:  Negative for chest pain and leg swelling.  Gastrointestinal:  Positive for abdominal pain. Negative for diarrhea, nausea and vomiting.       Pt reports "very mild" abdominal pain yesterday  Musculoskeletal:  Positive for arthralgias.  Pt reports right knee pain following recent surgery  Neurological:  Negative for dizziness, light-headedness and headaches.  Psychiatric/Behavioral:  Negative for confusion.     Vital Signs: BP 102/67   Pulse 96   Temp 98.6 F (37 C) (Oral)   Resp 16   SpO2 95%     Physical Exam Vitals reviewed.  Constitutional:      General: He is not in acute distress.    Appearance: Normal appearance.  HENT:     Head: Normocephalic.     Mouth/Throat:     Mouth: Mucous membranes are moist.  Cardiovascular:     Rate and Rhythm: Normal rate and regular rhythm.     Pulses: Normal pulses.     Heart sounds: Normal heart sounds.  Pulmonary:     Effort: Pulmonary effort is normal.     Breath sounds: Normal breath sounds.  Abdominal:     General: Bowel sounds are normal.     Palpations: Abdomen is soft.  Musculoskeletal:     Right lower leg: No edema.     Left lower leg: No edema.     Comments: Pt wearing right knee brace with dressings under brace  Skin:    General: Skin is warm and dry.  Neurological:     Mental Status: He is alert and oriented to person, place, and time.  Psychiatric:        Mood and Affect: Mood normal.        Behavior: Behavior normal.        Thought Content: Thought content normal.        Judgment: Judgment normal.     Imaging: NM PRE Y90 LIVER  SPECT LUNG SHUNT ASSESSMENT  Result Date: 02/28/2022 INDICATION: 56 year old male with recently diagnosed alcoholic cirrhosis and multifocal, left lobe dominant hepatocellular carcinoma now status post left lobar transarterial radioembolization (29.1 mCi) on 10/25/21. He is recovering well at 3 months. MRI demonstrates two enlarging masses in the right lobe, likely both in segment 8, the largest LR5 and the smaller LR3. Excellent treatment response to the segment II index lesion, LR-TR nonviable. EXAM: 1. Ultrasound-guided access of the right common femoral artery 2. Catheterization and angiography of the right, segment 7, and segment 8 hepatic arteries 3. Cone beam CT 4. MAA injection into the right hepatic artery MEDICATIONS: None. ANESTHESIA/SEDATION: Moderate (conscious) sedation was employed during this procedure. A total of Versed 2 mg and Fentanyl 150 mcg was administered intravenously. Moderate Sedation Time: 66 minutes. The patient's level of consciousness and vital signs were monitored continuously by radiology nursing throughout the procedure under my direct supervision. CONTRAST:  65 mL Omnipaque 300, intra arterial FLUOROSCOPY TIME:  One thousand three hundred thirteen mGy COMPLICATIONS: None immediate. PROCEDURE: Informed consent was obtained from the patient following explanation of the procedure, risks, benefits and alternatives. The patient understands, agrees and consents for the procedure. All questions were addressed. A time out was performed prior to the initiation of the procedure. Maximal barrier sterile technique utilized including caps, mask, sterile gowns, sterile gloves, large sterile drape, hand hygiene, and chlorhexidine prep. A preliminary ultrasound of the right groin was performed and demonstrates a patent right common femoral artery. A permanent ultrasound image was recorded. Using a combination of fluoroscopy and ultrasound, an access site was determined. The overlying skin was  anesthetized with 1% Lidocaine. Using ultrasound guidance, access into the right common femoral artery was obtained with visualization of needle entry into the vessel using standard micropuncture technique.  Limited angiogram of the common femoral artery demonstrates appropriate site for a closure device. A 5 French sheath was placed. A C2 catheter was advanced into the celiac artery. Celiac angiography demonstrates conventional anatomy. The portal vein is patent with hepatopedal flow. The catheter was then used to select the right hepatic artery and angiography was performed which demonstrated patent arteries without definite opacification of the hepatoma. A 2.5 French cantata microcatheter and fathom 16 microwire were used to select the segment 7 right hepatic artery. Angiography demonstrates no evidence of supply to the targeted mass. Cone beam CT was performed, confirming no evidence of hepatoma within segment 7. therefore, the catheter was retracted and redirected to the segment 8 hepatic artery. Angiography demonstrates arterial supply to the hepatic dome with possible tumor blush in the region of the expected hepatoma. Cone beam CT was performed, which demonstrated hyperenhancing mass in the dome of segment 8 measuring approximately 1.9 cm, similar to comparison MRI. Additionally, there is an approximately 9 mm hyperenhancing mass in the inferior portion of segment 8 compatible with the LI-RADS 3 lesion visualized on recent MRI. Per protocol, MAA was then infused into the right segment 8 hepatic artery. All wires and catheters were removed. Hemostasis was achieved using Angio-Seal device. There were no immediate complications. Peripheral pulses were unchanged. The patient was transferred to the recovery area in stable condition. IMPRESSION: 1. Visceral angiography demonstrating a hypervascular mass in the dome of the right hepatic lobe, consistent with hepatocellular carcinoma. 2. MAA infusion into the right  segment 8 hepatic artery. 3. We will follow up shunt fraction calculations and calculate liver volumes in preparation for segment 8 radiation segmentectomy. Ruthann Cancer, MD [facility] Radiology Electronically Signed   By: Ruthann Cancer M.D.   On: 02/28/2022 14:29   IR US Guide Vasc Access Right  Result Date: 02/28/2022 INDICATION: 56 year old male with recently diagnosed alcoholic cirrhosis and multifocal, left lobe dominant hepatocellular carcinoma now status post left lobar transarterial radioembolization (29.1 mCi) on 10/25/21. He is recovering well at 3 months. MRI demonstrates two enlarging masses in the right lobe, likely both in segment 8, the largest LR5 and the smaller LR3. Excellent treatment response to the segment II index lesion, LR-TR nonviable. EXAM: 1. Ultrasound-guided access of the right common femoral artery 2. Catheterization and angiography of the right, segment 7, and segment 8 hepatic arteries 3. Cone beam CT 4. MAA injection into the right hepatic artery MEDICATIONS: None. ANESTHESIA/SEDATION: Moderate (conscious) sedation was employed during this procedure. A total of Versed 2 mg and Fentanyl 150 mcg was administered intravenously. Moderate Sedation Time: 66 minutes. The patient's level of consciousness and vital signs were monitored continuously by radiology nursing throughout the procedure under my direct supervision. CONTRAST:  65 mL Omnipaque 300, intra arterial FLUOROSCOPY TIME:  One thousand three hundred thirteen mGy COMPLICATIONS: None immediate. PROCEDURE: Informed consent was obtained from the patient following explanation of the procedure, risks, benefits and alternatives. The patient understands, agrees and consents for the procedure. All questions were addressed. A time out was performed prior to the initiation of the procedure. Maximal barrier sterile technique utilized including caps, mask, sterile gowns, sterile gloves,  large sterile drape, hand hygiene, and chlorhexidine prep. A preliminary ultrasound of the right groin was performed and demonstrates a patent right common femoral artery. A permanent ultrasound image was recorded. Using a combination of fluoroscopy and ultrasound, an access site was determined. The overlying skin was anesthetized with 1% Lidocaine.  Using ultrasound guidance, access into the right common femoral artery was obtained with visualization of needle entry into the vessel using standard micropuncture technique. Limited angiogram of the common femoral artery demonstrates appropriate site for a closure device. A 5 French sheath was placed. A C2 catheter was advanced into the celiac artery. Celiac angiography demonstrates conventional anatomy. The portal vein is patent with hepatopedal flow. The catheter was then used to select the right hepatic artery and angiography was performed which demonstrated patent arteries without definite opacification of the hepatoma. A 2.5 French cantata microcatheter and fathom 16 microwire were used to select the segment 7 right hepatic artery. Angiography demonstrates no evidence of supply to the targeted mass. Cone beam CT was performed, confirming no evidence of hepatoma within segment 7. therefore, the catheter was retracted and redirected to the segment 8 hepatic artery. Angiography demonstrates arterial supply to the hepatic dome with possible tumor blush in the region of the expected hepatoma. Cone beam CT was performed, which demonstrated hyperenhancing mass in the dome of segment 8 measuring approximately 1.9 cm, similar to comparison MRI. Additionally, there is an approximately 9 mm hyperenhancing mass in the inferior portion of segment 8 compatible with the LI-RADS 3 lesion visualized on recent MRI. Per protocol, MAA was then infused into the right segment 8 hepatic artery. All wires and catheters were removed. Hemostasis was achieved using Angio-Seal device. There  were no immediate complications. Peripheral pulses were unchanged. The patient was transferred to the recovery area in stable condition. IMPRESSION: 1. Visceral angiography demonstrating a hypervascular mass in the dome of the right hepatic lobe, consistent with hepatocellular carcinoma. 2. MAA infusion into the right segment 8 hepatic artery. 3. We will follow up shunt fraction calculations and calculate liver volumes in preparation for segment 8 radiation segmentectomy. Ruthann Cancer, MD [facility] Radiology Electronically Signed   By: Ruthann Cancer M.D.   On: 02/28/2022 14:29   IR Angiogram Visceral Selective  Result Date: 02/28/2022 INDICATION: 56 year old male with recently diagnosed alcoholic cirrhosis and multifocal, left lobe dominant hepatocellular carcinoma now status post left lobar transarterial radioembolization (29.1 mCi) on 10/25/21. He is recovering well at 3 months. MRI demonstrates two enlarging masses in the right lobe, likely both in segment 8, the largest LR5 and the smaller LR3. Excellent treatment response to the segment II index lesion, LR-TR nonviable. EXAM: 1. Ultrasound-guided access of the right common femoral artery 2. Catheterization and angiography of the right, segment 7, and segment 8 hepatic arteries 3. Cone beam CT 4. MAA injection into the right hepatic artery MEDICATIONS: None. ANESTHESIA/SEDATION: Moderate (conscious) sedation was employed during this procedure. A total of Versed 2 mg and Fentanyl 150 mcg was administered intravenously. Moderate Sedation Time: 66 minutes. The patient's level of consciousness and vital signs were monitored continuously by radiology nursing throughout the procedure under my direct supervision. CONTRAST:  65 mL Omnipaque 300, intra arterial FLUOROSCOPY TIME:  One thousand three hundred thirteen mGy COMPLICATIONS: None immediate. PROCEDURE: Informed consent was obtained from the patient following  explanation of the procedure, risks, benefits and alternatives. The patient understands, agrees and consents for the procedure. All questions were addressed. A time out was performed prior to the initiation of the procedure. Maximal barrier sterile technique utilized including caps, mask, sterile gowns, sterile gloves, large sterile drape, hand hygiene, and chlorhexidine prep. A preliminary ultrasound of the right groin was performed and demonstrates a patent right common femoral artery. A permanent ultrasound image  was recorded. Using a combination of fluoroscopy and ultrasound, an access site was determined. The overlying skin was anesthetized with 1% Lidocaine. Using ultrasound guidance, access into the right common femoral artery was obtained with visualization of needle entry into the vessel using standard micropuncture technique. Limited angiogram of the common femoral artery demonstrates appropriate site for a closure device. A 5 French sheath was placed. A C2 catheter was advanced into the celiac artery. Celiac angiography demonstrates conventional anatomy. The portal vein is patent with hepatopedal flow. The catheter was then used to select the right hepatic artery and angiography was performed which demonstrated patent arteries without definite opacification of the hepatoma. A 2.5 French cantata microcatheter and fathom 16 microwire were used to select the segment 7 right hepatic artery. Angiography demonstrates no evidence of supply to the targeted mass. Cone beam CT was performed, confirming no evidence of hepatoma within segment 7. therefore, the catheter was retracted and redirected to the segment 8 hepatic artery. Angiography demonstrates arterial supply to the hepatic dome with possible tumor blush in the region of the expected hepatoma. Cone beam CT was performed, which demonstrated hyperenhancing mass in the dome of segment 8 measuring approximately 1.9 cm, similar to comparison MRI. Additionally,  there is an approximately 9 mm hyperenhancing mass in the inferior portion of segment 8 compatible with the LI-RADS 3 lesion visualized on recent MRI. Per protocol, MAA was then infused into the right segment 8 hepatic artery. All wires and catheters were removed. Hemostasis was achieved using Angio-Seal device. There were no immediate complications. Peripheral pulses were unchanged. The patient was transferred to the recovery area in stable condition. IMPRESSION: 1. Visceral angiography demonstrating a hypervascular mass in the dome of the right hepatic lobe, consistent with hepatocellular carcinoma. 2. MAA infusion into the right segment 8 hepatic artery. 3. We will follow up shunt fraction calculations and calculate liver volumes in preparation for segment 8 radiation segmentectomy. Ruthann Cancer, MD [facility] Radiology Electronically Signed   By: Ruthann Cancer M.D.   On: 02/28/2022 14:29   IR Angiogram Selective Each Additional Vessel  Result Date: 02/28/2022 INDICATION: 56 year old male with recently diagnosed alcoholic cirrhosis and multifocal, left lobe dominant hepatocellular carcinoma now status post left lobar transarterial radioembolization (29.1 mCi) on 10/25/21. He is recovering well at 3 months. MRI demonstrates two enlarging masses in the right lobe, likely both in segment 8, the largest LR5 and the smaller LR3. Excellent treatment response to the segment II index lesion, LR-TR nonviable. EXAM: 1. Ultrasound-guided access of the right common femoral artery 2. Catheterization and angiography of the right, segment 7, and segment 8 hepatic arteries 3. Cone beam CT 4. MAA injection into the right hepatic artery MEDICATIONS: None. ANESTHESIA/SEDATION: Moderate (conscious) sedation was employed during this procedure. A total of Versed 2 mg and Fentanyl 150 mcg was administered intravenously. Moderate Sedation Time: 66 minutes. The patient's level of  consciousness and vital signs were monitored continuously by radiology nursing throughout the procedure under my direct supervision. CONTRAST:  65 mL Omnipaque 300, intra arterial FLUOROSCOPY TIME:  One thousand three hundred thirteen mGy COMPLICATIONS: None immediate. PROCEDURE: Informed consent was obtained from the patient following explanation of the procedure, risks, benefits and alternatives. The patient understands, agrees and consents for the procedure. All questions were addressed. A time out was performed prior to the initiation of the procedure. Maximal barrier sterile technique utilized including caps, mask, sterile gowns, sterile gloves, large sterile drape, hand hygiene,  and chlorhexidine prep. A preliminary ultrasound of the right groin was performed and demonstrates a patent right common femoral artery. A permanent ultrasound image was recorded. Using a combination of fluoroscopy and ultrasound, an access site was determined. The overlying skin was anesthetized with 1% Lidocaine. Using ultrasound guidance, access into the right common femoral artery was obtained with visualization of needle entry into the vessel using standard micropuncture technique. Limited angiogram of the common femoral artery demonstrates appropriate site for a closure device. A 5 French sheath was placed. A C2 catheter was advanced into the celiac artery. Celiac angiography demonstrates conventional anatomy. The portal vein is patent with hepatopedal flow. The catheter was then used to select the right hepatic artery and angiography was performed which demonstrated patent arteries without definite opacification of the hepatoma. A 2.5 French cantata microcatheter and fathom 16 microwire were used to select the segment 7 right hepatic artery. Angiography demonstrates no evidence of supply to the targeted mass. Cone beam CT was performed, confirming no evidence of hepatoma within segment 7. therefore, the catheter was retracted  and redirected to the segment 8 hepatic artery. Angiography demonstrates arterial supply to the hepatic dome with possible tumor blush in the region of the expected hepatoma. Cone beam CT was performed, which demonstrated hyperenhancing mass in the dome of segment 8 measuring approximately 1.9 cm, similar to comparison MRI. Additionally, there is an approximately 9 mm hyperenhancing mass in the inferior portion of segment 8 compatible with the LI-RADS 3 lesion visualized on recent MRI. Per protocol, MAA was then infused into the right segment 8 hepatic artery. All wires and catheters were removed. Hemostasis was achieved using Angio-Seal device. There were no immediate complications. Peripheral pulses were unchanged. The patient was transferred to the recovery area in stable condition. IMPRESSION: 1. Visceral angiography demonstrating a hypervascular mass in the dome of the right hepatic lobe, consistent with hepatocellular carcinoma. 2. MAA infusion into the right segment 8 hepatic artery. 3. We will follow up shunt fraction calculations and calculate liver volumes in preparation for segment 8 radiation segmentectomy. Ruthann Cancer, MD [facility] Radiology Electronically Signed   By: Ruthann Cancer M.D.   On: 02/28/2022 14:29   IR Angiogram Selective Each Additional Vessel  Result Date: 02/28/2022 INDICATION: 56 year old male with recently diagnosed alcoholic cirrhosis and multifocal, left lobe dominant hepatocellular carcinoma now status post left lobar transarterial radioembolization (29.1 mCi) on 10/25/21. He is recovering well at 3 months. MRI demonstrates two enlarging masses in the right lobe, likely both in segment 8, the largest LR5 and the smaller LR3. Excellent treatment response to the segment II index lesion, LR-TR nonviable. EXAM: 1. Ultrasound-guided access of the right common femoral artery 2. Catheterization and angiography of the right, segment 7,  and segment 8 hepatic arteries 3. Cone beam CT 4. MAA injection into the right hepatic artery MEDICATIONS: None. ANESTHESIA/SEDATION: Moderate (conscious) sedation was employed during this procedure. A total of Versed 2 mg and Fentanyl 150 mcg was administered intravenously. Moderate Sedation Time: 66 minutes. The patient's level of consciousness and vital signs were monitored continuously by radiology nursing throughout the procedure under my direct supervision. CONTRAST:  65 mL Omnipaque 300, intra arterial FLUOROSCOPY TIME:  One thousand three hundred thirteen mGy COMPLICATIONS: None immediate. PROCEDURE: Informed consent was obtained from the patient following explanation of the procedure, risks, benefits and alternatives. The patient understands, agrees and consents for the procedure. All questions were addressed. A time out was performed  prior to the initiation of the procedure. Maximal barrier sterile technique utilized including caps, mask, sterile gowns, sterile gloves, large sterile drape, hand hygiene, and chlorhexidine prep. A preliminary ultrasound of the right groin was performed and demonstrates a patent right common femoral artery. A permanent ultrasound image was recorded. Using a combination of fluoroscopy and ultrasound, an access site was determined. The overlying skin was anesthetized with 1% Lidocaine. Using ultrasound guidance, access into the right common femoral artery was obtained with visualization of needle entry into the vessel using standard micropuncture technique. Limited angiogram of the common femoral artery demonstrates appropriate site for a closure device. A 5 French sheath was placed. A C2 catheter was advanced into the celiac artery. Celiac angiography demonstrates conventional anatomy. The portal vein is patent with hepatopedal flow. The catheter was then used to select the right hepatic artery and angiography was performed which demonstrated patent arteries without definite  opacification of the hepatoma. A 2.5 French cantata microcatheter and fathom 16 microwire were used to select the segment 7 right hepatic artery. Angiography demonstrates no evidence of supply to the targeted mass. Cone beam CT was performed, confirming no evidence of hepatoma within segment 7. therefore, the catheter was retracted and redirected to the segment 8 hepatic artery. Angiography demonstrates arterial supply to the hepatic dome with possible tumor blush in the region of the expected hepatoma. Cone beam CT was performed, which demonstrated hyperenhancing mass in the dome of segment 8 measuring approximately 1.9 cm, similar to comparison MRI. Additionally, there is an approximately 9 mm hyperenhancing mass in the inferior portion of segment 8 compatible with the LI-RADS 3 lesion visualized on recent MRI. Per protocol, MAA was then infused into the right segment 8 hepatic artery. All wires and catheters were removed. Hemostasis was achieved using Angio-Seal device. There were no immediate complications. Peripheral pulses were unchanged. The patient was transferred to the recovery area in stable condition. IMPRESSION: 1. Visceral angiography demonstrating a hypervascular mass in the dome of the right hepatic lobe, consistent with hepatocellular carcinoma. 2. MAA infusion into the right segment 8 hepatic artery. 3. We will follow up shunt fraction calculations and calculate liver volumes in preparation for segment 8 radiation segmentectomy. Ruthann Cancer, MD [facility] Radiology Electronically Signed   By: Ruthann Cancer M.D.   On: 02/28/2022 14:29   IR 3D Independent Darreld Mclean  Result Date: 02/28/2022 INDICATION: 56 year old male with recently diagnosed alcoholic cirrhosis and multifocal, left lobe dominant hepatocellular carcinoma now status post left lobar transarterial radioembolization (29.1 mCi) on 10/25/21. He is recovering well at 3 months. MRI demonstrates  two enlarging masses in the right lobe, likely both in segment 8, the largest LR5 and the smaller LR3. Excellent treatment response to the segment II index lesion, LR-TR nonviable. EXAM: 1. Ultrasound-guided access of the right common femoral artery 2. Catheterization and angiography of the right, segment 7, and segment 8 hepatic arteries 3. Cone beam CT 4. MAA injection into the right hepatic artery MEDICATIONS: None. ANESTHESIA/SEDATION: Moderate (conscious) sedation was employed during this procedure. A total of Versed 2 mg and Fentanyl 150 mcg was administered intravenously. Moderate Sedation Time: 66 minutes. The patient's level of consciousness and vital signs were monitored continuously by radiology nursing throughout the procedure under my direct supervision. CONTRAST:  65 mL Omnipaque 300, intra arterial FLUOROSCOPY TIME:  One thousand three hundred thirteen mGy COMPLICATIONS: None immediate. PROCEDURE: Informed consent was obtained from the patient following explanation of the procedure,  risks, benefits and alternatives. The patient understands, agrees and consents for the procedure. All questions were addressed. A time out was performed prior to the initiation of the procedure. Maximal barrier sterile technique utilized including caps, mask, sterile gowns, sterile gloves, large sterile drape, hand hygiene, and chlorhexidine prep. A preliminary ultrasound of the right groin was performed and demonstrates a patent right common femoral artery. A permanent ultrasound image was recorded. Using a combination of fluoroscopy and ultrasound, an access site was determined. The overlying skin was anesthetized with 1% Lidocaine. Using ultrasound guidance, access into the right common femoral artery was obtained with visualization of needle entry into the vessel using standard micropuncture technique. Limited angiogram of the common femoral artery demonstrates appropriate site for a closure device. A 5 French sheath  was placed. A C2 catheter was advanced into the celiac artery. Celiac angiography demonstrates conventional anatomy. The portal vein is patent with hepatopedal flow. The catheter was then used to select the right hepatic artery and angiography was performed which demonstrated patent arteries without definite opacification of the hepatoma. A 2.5 French cantata microcatheter and fathom 16 microwire were used to select the segment 7 right hepatic artery. Angiography demonstrates no evidence of supply to the targeted mass. Cone beam CT was performed, confirming no evidence of hepatoma within segment 7. therefore, the catheter was retracted and redirected to the segment 8 hepatic artery. Angiography demonstrates arterial supply to the hepatic dome with possible tumor blush in the region of the expected hepatoma. Cone beam CT was performed, which demonstrated hyperenhancing mass in the dome of segment 8 measuring approximately 1.9 cm, similar to comparison MRI. Additionally, there is an approximately 9 mm hyperenhancing mass in the inferior portion of segment 8 compatible with the LI-RADS 3 lesion visualized on recent MRI. Per protocol, MAA was then infused into the right segment 8 hepatic artery. All wires and catheters were removed. Hemostasis was achieved using Angio-Seal device. There were no immediate complications. Peripheral pulses were unchanged. The patient was transferred to the recovery area in stable condition. IMPRESSION: 1. Visceral angiography demonstrating a hypervascular mass in the dome of the right hepatic lobe, consistent with hepatocellular carcinoma. 2. MAA infusion into the right segment 8 hepatic artery. 3. We will follow up shunt fraction calculations and calculate liver volumes in preparation for segment 8 radiation segmentectomy. Ruthann Cancer, MD [facility] Radiology Electronically Signed   By: Ruthann Cancer M.D.   On: 02/28/2022 14:29   IR  Angiogram Selective Each Additional Vessel  Result Date: 02/28/2022 INDICATION: 56 year old male with recently diagnosed alcoholic cirrhosis and multifocal, left lobe dominant hepatocellular carcinoma now status post left lobar transarterial radioembolization (29.1 mCi) on 10/25/21. He is recovering well at 3 months. MRI demonstrates two enlarging masses in the right lobe, likely both in segment 8, the largest LR5 and the smaller LR3. Excellent treatment response to the segment II index lesion, LR-TR nonviable. EXAM: 1. Ultrasound-guided access of the right common femoral artery 2. Catheterization and angiography of the right, segment 7, and segment 8 hepatic arteries 3. Cone beam CT 4. MAA injection into the right hepatic artery MEDICATIONS: None. ANESTHESIA/SEDATION: Moderate (conscious) sedation was employed during this procedure. A total of Versed 2 mg and Fentanyl 150 mcg was administered intravenously. Moderate Sedation Time: 66 minutes. The patient's level of consciousness and vital signs were monitored continuously by radiology nursing throughout the procedure under my direct supervision. CONTRAST:  65 mL Omnipaque 300, intra arterial FLUOROSCOPY  TIME:  One thousand three hundred thirteen mGy COMPLICATIONS: None immediate. PROCEDURE: Informed consent was obtained from the patient following explanation of the procedure, risks, benefits and alternatives. The patient understands, agrees and consents for the procedure. All questions were addressed. A time out was performed prior to the initiation of the procedure. Maximal barrier sterile technique utilized including caps, mask, sterile gowns, sterile gloves, large sterile drape, hand hygiene, and chlorhexidine prep. A preliminary ultrasound of the right groin was performed and demonstrates a patent right common femoral artery. A permanent ultrasound image was recorded. Using a combination of fluoroscopy and ultrasound, an access site was determined. The  overlying skin was anesthetized with 1% Lidocaine. Using ultrasound guidance, access into the right common femoral artery was obtained with visualization of needle entry into the vessel using standard micropuncture technique. Limited angiogram of the common femoral artery demonstrates appropriate site for a closure device. A 5 French sheath was placed. A C2 catheter was advanced into the celiac artery. Celiac angiography demonstrates conventional anatomy. The portal vein is patent with hepatopedal flow. The catheter was then used to select the right hepatic artery and angiography was performed which demonstrated patent arteries without definite opacification of the hepatoma. A 2.5 French cantata microcatheter and fathom 16 microwire were used to select the segment 7 right hepatic artery. Angiography demonstrates no evidence of supply to the targeted mass. Cone beam CT was performed, confirming no evidence of hepatoma within segment 7. therefore, the catheter was retracted and redirected to the segment 8 hepatic artery. Angiography demonstrates arterial supply to the hepatic dome with possible tumor blush in the region of the expected hepatoma. Cone beam CT was performed, which demonstrated hyperenhancing mass in the dome of segment 8 measuring approximately 1.9 cm, similar to comparison MRI. Additionally, there is an approximately 9 mm hyperenhancing mass in the inferior portion of segment 8 compatible with the LI-RADS 3 lesion visualized on recent MRI. Per protocol, MAA was then infused into the right segment 8 hepatic artery. All wires and catheters were removed. Hemostasis was achieved using Angio-Seal device. There were no immediate complications. Peripheral pulses were unchanged. The patient was transferred to the recovery area in stable condition. IMPRESSION: 1. Visceral angiography demonstrating a hypervascular mass in the dome of the right hepatic lobe, consistent with hepatocellular carcinoma. 2. MAA  infusion into the right segment 8 hepatic artery. 3. We will follow up shunt fraction calculations and calculate liver volumes in preparation for segment 8 radiation segmentectomy. Ruthann Cancer, MD [facility] Radiology Electronically Signed   By: Ruthann Cancer M.D.   On: 02/28/2022 14:29    Labs:  CBC: Recent Labs    09/26/21 1239 10/11/21 0743 10/25/21 0848 02/28/22 0822  WBC 7.9 5.9 8.1 4.5  HGB 15.1 14.6 15.0 12.1*  HCT 45.5 42.1 44.0 35.8*  PLT 156 113* 113* 130*    COAGS: Recent Labs    06/11/21 0907 10/11/21 0743 10/25/21 0848 02/28/22 0822  INR 1.1 1.1 1.1 1.1    BMP: Recent Labs    09/26/21 1239 10/11/21 0743 10/25/21 0848 02/28/22 0822  NA 139 135 137 138  K 4.1 4.1 3.9 3.9  CL 104 106 105 107  CO2 '24 23 22 23  '$ GLUCOSE 115* 166* 135* 178*  BUN '13 14 14 16  '$ CALCIUM 9.5 9.1 9.2 9.0  CREATININE 0.85 0.78 0.78 0.90  GFRNONAA >60 >60 >60 >60    LIVER FUNCTION TESTS: Recent Labs    09/26/21 1239  10/11/21 0743 10/25/21 0848 02/28/22 0822  BILITOT 0.9 0.6 0.7 1.1  AST 63* 41 48* 149*  ALT 49* 46* 43 68*  ALKPHOS 105 116 114 140*  PROT 7.2 6.8 7.8 6.3*  ALBUMIN 3.6 3.4* 4.0 3.0*    TUMOR MARKERS: Recent Labs    06/11/21 0907  AFPTM 12.4*    Assessment and Plan:  Adewale Pucillo is a 56 yo male being seen today for Y-90 radioembolization for treatment of his hepatocellular carcinoma. Imaging has been reviewed and patient has been approved for procedure on 03/14/22 with Dr Serafina Royals.  Risks and benefits discussed with the patient including, but not limited to bleeding, infection, vascular injury, post procedural pain, nausea, vomiting and fatigue, contrast induced renal failure, liver failure, radiation injury to the bowel, radiation induced cholecystitis, neutropenia and possible need for additional procedures.  All of the patient's questions were answered, patient is agreeable to proceed. Consent  signed and in patient room.   Thank you for this interesting consult.  I greatly enjoyed meeting YANDELL MCJUNKINS and look forward to participating in their care.  A copy of this report was sent to the requesting provider on this date.  Electronically Signed: Lura Em, PA 03/14/2022, 8:27 AM   I spent a total of    15 Minutes in face to face in clinical consultation, greater than 50% of which was counseling/coordinating care for Y-90 radioembolization.

## 2022-03-14 NOTE — Procedures (Signed)
Interventional Radiology Procedure Note  Procedure: Transarterial radioembolization  Findings: Please refer to procedural dictation for full description. Segment VIII radiation segmentectomy.  Right CFA 6 Fr Angioseal closure.  Complications: None immediate  Estimated Blood Loss: < 5 mL  Recommendations: Strict 4 hour bedrest, 2 hours flat (until 13:15), followed by 2 hours with head of bed up to 30 degrees (until 15:15). IR will arrange for 2-3 week outpatient follow up.  Plan for repeat MRI abdomen in 3 months.   Ruthann Cancer, MD Pager: [phone]

## 2022-03-15 LAB — AFP TUMOR MARKER: AFP, Serum, Tumor Marker: 8.1 ng/mL (ref 0.0–8.4)

## 2022-03-17 ENCOUNTER — Other Ambulatory Visit: Payer: Self-pay | Admitting: Orthopedic Surgery

## 2022-03-17 DIAGNOSIS — T8189XD Other complications of procedures, not elsewhere classified, subsequent encounter: Secondary | ICD-10-CM

## 2022-03-18 ENCOUNTER — Ambulatory Visit (INDEPENDENT_AMBULATORY_CARE_PROVIDER_SITE_OTHER): Payer: Medicaid Other | Admitting: Orthopedic Surgery

## 2022-03-18 DIAGNOSIS — Z9889 Other specified postprocedural states: Secondary | ICD-10-CM

## 2022-03-18 DIAGNOSIS — S76111D Strain of right quadriceps muscle, fascia and tendon, subsequent encounter: Secondary | ICD-10-CM | POA: Diagnosis not present

## 2022-03-18 DIAGNOSIS — T8189XD Other complications of procedures, not elsewhere classified, subsequent encounter: Secondary | ICD-10-CM | POA: Diagnosis not present

## 2022-03-18 DIAGNOSIS — G894 Chronic pain syndrome: Secondary | ICD-10-CM

## 2022-03-18 DIAGNOSIS — S76111S Strain of right quadriceps muscle, fascia and tendon, sequela: Secondary | ICD-10-CM

## 2022-03-18 DIAGNOSIS — T8149XA Infection following a procedure, other surgical site, initial encounter: Secondary | ICD-10-CM | POA: Diagnosis not present

## 2022-03-18 MED ORDER — OXYCODONE-ACETAMINOPHEN 10-325 MG PO TABS: 1.0000 | ORAL_TABLET | ORAL | 0 refills | Status: DC | PRN

## 2022-03-18 NOTE — Progress Notes (Signed)
FOLLOW UP   Encounter Diagnoses  Name Primary?   Non-healing surgical wound, subsequent encounter Yes   Rupture of right quadriceps tendon, subsequent encounter repair 09/04/21    Cellulitis, wound, post-operative    Status post incision and drainage 11/02/2021 right knee    Rupture of right quadriceps tendon, sequela    Chronic pain syndrome      Chief Complaint  Patient presents with   Leg Injury    R/ still hurting.     Nonhealing surgical wound right knee healed  New wound right shin and right great toe, vascular consult requested  Postop cellulitis resolved Status post incision and drainage right knee for wound also resolved Quadriceps tendon weakness sequela of prior surgery  The patient has exhausted all wound care options he has a mid tibial wound and he has a great toe wound for which a vascular consult was requested on June 20 and we requested and checked into today to try to get him in  We will continue with his chronic pain control with oxycodone   I suspect we will just follow him on a monthly basis to check on the wounds and see what vascular says about his midshaft tibial wound and great toe wound which are unrelated to his right knee wound which is healed

## 2022-03-21 ENCOUNTER — Other Ambulatory Visit: Payer: Self-pay | Admitting: *Deleted

## 2022-03-21 DIAGNOSIS — I739 Peripheral vascular disease, unspecified: Secondary | ICD-10-CM

## 2022-03-24 ENCOUNTER — Other Ambulatory Visit: Payer: Self-pay | Admitting: Orthopedic Surgery

## 2022-03-24 DIAGNOSIS — T8189XD Other complications of procedures, not elsewhere classified, subsequent encounter: Secondary | ICD-10-CM

## 2022-03-25 MED ORDER — OXYCODONE-ACETAMINOPHEN 10-325 MG PO TABS: 1.0000 | ORAL_TABLET | ORAL | 0 refills | Status: DC | PRN

## 2022-03-26 ENCOUNTER — Ambulatory Visit (HOSPITAL_COMMUNITY): Payer: Medicaid Other

## 2022-03-27 ENCOUNTER — Ambulatory Visit (INDEPENDENT_AMBULATORY_CARE_PROVIDER_SITE_OTHER): Payer: Medicaid Other | Admitting: Vascular Surgery

## 2022-03-27 ENCOUNTER — Other Ambulatory Visit: Payer: Self-pay | Admitting: Interventional Radiology

## 2022-03-27 ENCOUNTER — Encounter: Payer: Self-pay | Admitting: Orthopedic Surgery

## 2022-03-27 ENCOUNTER — Ambulatory Visit (HOSPITAL_COMMUNITY)
Admission: RE | Admit: 2022-03-27 | Discharge: 2022-03-27 | Disposition: A | Discharge status: Home / Self Care | Payer: Medicaid Other | Source: Ambulatory Visit | Attending: Vascular Surgery | Admitting: Vascular Surgery

## 2022-03-27 ENCOUNTER — Encounter: Payer: Self-pay | Admitting: Vascular Surgery

## 2022-03-27 VITALS — BP 96/59 | HR 80 | Temp 98.0°F | Resp 20 | Ht 69.0 in | Wt 155.5 lb

## 2022-03-27 DIAGNOSIS — I739 Peripheral vascular disease, unspecified: Secondary | ICD-10-CM | POA: Insufficient documentation

## 2022-03-27 DIAGNOSIS — C220 Liver cell carcinoma: Secondary | ICD-10-CM

## 2022-03-27 DIAGNOSIS — S76111D Strain of right quadriceps muscle, fascia and tendon, subsequent encounter: Secondary | ICD-10-CM

## 2022-03-27 NOTE — Progress Notes (Signed)
Patient ID: Danny [name], male   DOB: [birth year], 56 y.o.   MRN: [id number]  Reason for Consult: New Patient (Initial Visit)   Referred by Denyce Robert, FNP  Subjective:     HPI:  Danny [name] is a 56 y.o. male with history of hepatocellular carcinoma and stroke also has diabetes hypertension currently smoking 1/2 pack/day of cigarettes.  In December he underwent surgery to repair torn tendon in his right thigh that occurred on Halloween.  Since that time he has had slow to heal wound required reoperation in the right leg and now has a wound where the cast rubbed on the anterior shin and also developed ulceration of the right great toe medially.  He denies any fevers or chills.  He has been very weak undergoing chemoembolization of his cancer.  He has lost significant weight does have significant fatigue walks with the help of a walker.  Has never had any vascular invention in the past.  Occasionally takes aspirin no other blood thinners.  Past Medical History:  Diagnosis Date   Alcohol abuse    Anxiety    Back pain    CVA (cerebral infarction)    Depression    Diabetes mellitus    Hypertension    Migraines    Stroke [facility])    Family History  Problem Relation Age of Onset   Heart failure Mother    Stroke Father    Stroke Sister    Cancer Sister        breast cancer   Colon cancer Neg Hx    Liver cancer Neg Hx    Past Surgical History:  Procedure Laterality Date   CAST APPLICATION Right 2/63/7858   Procedure: CAST APPLICATION;  Surgeon: Carole Civil, MD;  Location: [facility];  Service: Orthopedics;  Laterality: Right;   FOOT SURGERY     INCISION AND DRAINAGE Right 11/02/2021   Procedure: INCISION AND DRAINAGE RIGHT KNEE;  Surgeon: Carole Civil, MD;  Location: [facility];  Service: Orthopedics;  Laterality: Right;   IR 3D INDEPENDENT WKST  10/11/2021   IR 3D INDEPENDENT WKST  02/28/2022   IR 3D INDEPENDENT WKST  03/14/2022   IR ANGIOGRAM SELECTIVE EACH  ADDITIONAL VESSEL  10/11/2021   IR ANGIOGRAM SELECTIVE EACH ADDITIONAL VESSEL  10/11/2021   IR ANGIOGRAM SELECTIVE EACH ADDITIONAL VESSEL  10/11/2021   IR ANGIOGRAM SELECTIVE EACH ADDITIONAL VESSEL  10/11/2021   IR ANGIOGRAM SELECTIVE EACH ADDITIONAL VESSEL  10/11/2021   IR ANGIOGRAM SELECTIVE EACH ADDITIONAL VESSEL  10/11/2021   IR ANGIOGRAM SELECTIVE EACH ADDITIONAL VESSEL  10/25/2021   IR ANGIOGRAM SELECTIVE EACH ADDITIONAL VESSEL  10/25/2021   IR ANGIOGRAM SELECTIVE EACH ADDITIONAL VESSEL  02/28/2022   IR ANGIOGRAM SELECTIVE EACH ADDITIONAL VESSEL  02/28/2022   IR ANGIOGRAM SELECTIVE EACH ADDITIONAL VESSEL  02/28/2022   IR ANGIOGRAM SELECTIVE EACH ADDITIONAL VESSEL  03/14/2022   IR ANGIOGRAM VISCERAL SELECTIVE  10/11/2021   IR ANGIOGRAM VISCERAL SELECTIVE  02/28/2022   IR ANGIOGRAM VISCERAL SELECTIVE  03/14/2022   IR EMBO TUMOR ORGAN ISCHEMIA INFARCT INC GUIDE ROADMAPPING  10/25/2021   IR EMBO TUMOR ORGAN ISCHEMIA INFARCT INC GUIDE ROADMAPPING  10/25/2021   IR EMBO TUMOR ORGAN ISCHEMIA INFARCT INC GUIDE ROADMAPPING  03/14/2022   IR RADIOLOGIST EVAL & MGMT  08/23/2021   IR RADIOLOGIST EVAL & MGMT  11/21/2021   IR RADIOLOGIST EVAL & MGMT  01/24/2022   IR US GUIDE VASC ACCESS RIGHT  10/11/2021   IR US  GUIDE VASC ACCESS RIGHT  10/25/2021   IR US GUIDE VASC ACCESS RIGHT  02/28/2022   IR US GUIDE VASC ACCESS RIGHT  03/14/2022   MANDIBLE SURGERY     QUADRICEPS TENDON REPAIR Right 09/04/2021   Procedure: REPAIR QUADRICEP TENDON;  Surgeon: Carole Civil, MD;  Location: [facility];  Service: Orthopedics;  Laterality: Right;   TENDON EXPLORATION Right 11/02/2021   Procedure: QUADRICEPS TENDON AND PATELLAR TENDON EXPLORATION;  Surgeon: Carole Civil, MD;  Location: [facility];  Service: Orthopedics;  Laterality: Right;    Short Social History:  Social History   Tobacco Use   Smoking status: Every Day    Packs/day: 0.50    Years: 20.00    Total pack years: 10.00    Types: Cigarettes   Smokeless tobacco: Never   Substance Use Topics   Alcohol use: Not Currently    Comment: Quit 11 months ago. Used to drink about 6 pack a day. (documented 06/06/21)    Allergies  Allergen Reactions   Ultram [Tramadol] Hives   Benadryl [Diphenhydramine] Itching and Rash    Current Outpatient Medications  Medication Sig Dispense Refill   albuterol (VENTOLIN HFA) 108 (90 Base) MCG/ACT inhaler Inhale 2 puffs into the lungs every 6 (six) hours as needed for wheezing or shortness of breath.     Cholecalciferol (VITAMIN D-3) 125 MCG (5000 UT) TABS Take 5,000 Units by mouth daily.     citalopram (CELEXA) 20 MG tablet Take 20 mg by mouth daily.     cyclobenzaprine (FLEXERIL) 5 MG tablet Take 5 mg by mouth daily as needed.     gabapentin (NEURONTIN) 600 MG tablet Take 600 mg by mouth in the morning and at bedtime.     meloxicam (MOBIC) 15 MG tablet Take 15 mg by mouth daily as needed.     meloxicam (MOBIC) 7.5 MG tablet Take 7.5 mg by mouth in the morning and at bedtime.     metFORMIN (GLUCOPHAGE) 500 MG tablet Take 1 tablet (500 mg total) by mouth 2 (two) times daily with a meal. 60 tablet 2   naloxone (NARCAN) nasal spray 4 mg/0.1 mL SMARTSIG:1 Both Nares Daily     omeprazole (PRILOSEC) 40 MG capsule Take 1 capsule (40 mg total) by mouth daily before breakfast. 30 capsule 3   oxyCODONE-acetaminophen (PERCOCET) 10-325 MG tablet Take 1 tablet by mouth every 4 (four) hours as needed for pain. 30 tablet 0   QUEtiapine (SEROQUEL) 100 MG tablet Take 100 mg by mouth at bedtime.     No current facility-administered medications for this visit.    Review of Systems  Constitutional: Positive for fatigue and unexpected weight change.  HENT: HENT negative.  Eyes: Eyes negative.  Respiratory: Respiratory negative.  Cardiovascular: Cardiovascular negative.  GI: Gastrointestinal negative.  Skin: Positive for wound.  Neurological: Neurological negative. Hematologic: Hematologic/lymphatic negative.  Psychiatric: Psychiatric  negative.        Objective:  Objective  Vitals:   03/27/22 1337  BP: (!) 96/59  Pulse: 80  Resp: 20  Temp: 98 F (36.7 C)  SpO2: 95%     Physical Exam HENT:     Head: Normocephalic.     Nose: Nose normal.  Eyes:     Pupils: Pupils are equal, round, and reactive to light.  Cardiovascular:     Pulses:          Radial pulses are 2+ on the right side and 2+ on the left side.  Femoral pulses are 2+ on the right side and 2+ on the left side.      Popliteal pulses are 0 on the right side and 2+ on the left side.       Dorsalis pedis pulses are 0 on the right side and 2+ on the left side.       Posterior tibial pulses are 0 on the right side.  Pulmonary:     Effort: Pulmonary effort is normal.  Abdominal:     General: Abdomen is flat.     Palpations: Abdomen is soft. There is no mass.  Skin:    Capillary Refill: Capillary refill takes more than 3 seconds.     Comments: Dressing on right anterior leg wound there is a granulated wound in the right knee and medial right great toe has ulceration that is superficial  Neurological:     General: No focal deficit present.     Mental Status: He is alert.  Psychiatric:        Mood and Affect: Mood normal.     Data: ABI Findings:  +---------+------------------+-----+----------+--------------------------+  Right    Rt Pressure (mmHg)IndexWaveform  Comment                     +---------+------------------+-----+----------+--------------------------+  Brachial 89                                                           +---------+------------------+-----+----------+--------------------------+  PTA      55                0.62 monophasic                            +---------+------------------+-----+----------+--------------------------+  DP       54                0.61 monophasic                            +---------+------------------+-----+----------+--------------------------+  Great Toe                                  cannot obtain due to wound  +---------+------------------+-----+----------+--------------------------+   +---------+------------------+-----+---------+-------+  Left     Lt Pressure (mmHg)IndexWaveform Comment  +---------+------------------+-----+---------+-------+  Brachial 85                                       +---------+------------------+-----+---------+-------+  PTA      84                0.94 triphasic         +---------+------------------+-----+---------+-------+  DP       81                0.91 triphasic         +---------+------------------+-----+---------+-------+  Great Toe63                0.71                   +---------+------------------+-----+---------+-------+   +-------+-----------+--------------------------+------------+------------+  ABI/TBIToday's ABIToday's TBI               Previous ABIPrevious TBI  +-------+-----------+--------------------------+------------+------------+  Right  0.62       cannot obtain due to wound                          +-------+-----------+--------------------------+------------+------------+  Left   0.94       0.71                                                +-------+-----------+--------------------------+------------+------------+     Summary:  Right: Resting right ankle-brachial index indicates moderate right lower  extremity arterial disease.   Left: Resting left ankle-brachial index indicates mild left lower  extremity arterial disease. The left toe-brachial index is normal.       Assessment/Plan:    56 year old male with chronic lymphatic ischemia right lower extremity with toe ulceration toe pressure was not measured today with ABI 0.6 range without palpable pulses below the femoral.  I discussed with him that he needs smoking cessation and to begin 81 mg of aspirin daily.  We will begin with angiography of the right lower extremity from the left  common femoral approach and will need to be aggressive to revascularize him given his underlying medical conditions.  He demonstrates good understanding in the presence of his niece who is his power of attorney.     Waynetta Sandy MD [facility]

## 2022-03-28 ENCOUNTER — Encounter: Payer: Self-pay | Admitting: Orthopedic Surgery

## 2022-03-31 ENCOUNTER — Other Ambulatory Visit: Payer: Self-pay | Admitting: Orthopedic Surgery

## 2022-03-31 DIAGNOSIS — T8189XD Other complications of procedures, not elsewhere classified, subsequent encounter: Secondary | ICD-10-CM

## 2022-04-01 MED ORDER — OXYCODONE-ACETAMINOPHEN 10-325 MG PO TABS: 1.0000 | ORAL_TABLET | ORAL | 0 refills | Status: DC | PRN

## 2022-04-08 ENCOUNTER — Other Ambulatory Visit: Payer: Self-pay | Admitting: Orthopedic Surgery

## 2022-04-08 DIAGNOSIS — T8189XD Other complications of procedures, not elsewhere classified, subsequent encounter: Secondary | ICD-10-CM

## 2022-04-08 MED ORDER — OXYCODONE-ACETAMINOPHEN 10-325 MG PO TABS: 1.0000 | ORAL_TABLET | ORAL | 0 refills | Status: DC | PRN

## 2022-04-09 ENCOUNTER — Encounter: Payer: Self-pay | Admitting: *Deleted

## 2022-04-09 ENCOUNTER — Ambulatory Visit
Admission: RE | Admit: 2022-04-09 | Discharge: 2022-04-09 | Disposition: A | Discharge status: Home / Self Care | Payer: Medicaid Other | Source: Ambulatory Visit | Attending: Interventional Radiology | Admitting: Interventional Radiology

## 2022-04-09 DIAGNOSIS — C220 Liver cell carcinoma: Secondary | ICD-10-CM

## 2022-04-09 HISTORY — PX: IR RADIOLOGIST EVAL & MGMT: IMG5224

## 2022-04-09 NOTE — Progress Notes (Signed)
Referring Physician(s): Roosevelt Locks, NP  Reason for follow up:  1 month status post right transarterial radioembolization, and 6 months status post left transarterial radioembolization  He presents today via virtual telephone office visit.  History of present illness: Danny [name] is a 56 y.o. male with history of alcoholic cirrhosis and multifocal hepatocellular carcinoma.  He had complained of right upper quadrant pain for approximately 1 year.  Multiple imaging studies eventually led to a CT which demonstrated enhancing lesions in the left lobe of the liver.  This prompted and MRI which demonstrates a 2.7 cm LIRADS 5 mass in segment II and LIRADS 3 lesion in the right dome.  There was a 1.6 cm mass on prior CTA in segment III which was inconspicuous on MR, but highly suspicious for additional HCC.  For this, he is status post left hepatic TARE on 10/25/21.  Approximately 50% of the dose was delivered in segmentectomy fashion to the index segment II hepatoma followed by delivery of the remaining in a lobar distribution.  Initial 3 month post-TARE MRI abdomen showed enlargement of right sided masses and good response of the left sided, treated masses.  Therefore, he underwent additional mapping and then segment VIII radiation segmentectomy on 03/14/22.   He has some persistent mild fatigue.  No nausea or vomiting.  He describes a dull upper abdominal pain, similar to the pain he has experienced over the past year, no worsening.  No recent fevers, chills, or jaundice.  Great appetite. Forthcoming RLE angiogram with Dr. Donzetta Matters due to poor healing after his prior operations.      Past Medical History:  Diagnosis Date   Alcohol abuse    Anxiety    Back pain    CVA (cerebral infarction)    Depression    Diabetes mellitus    Hypertension    Migraines    Stroke [facility])     Past Surgical History:  Procedure Laterality Date   CAST APPLICATION Right 0/35/5974   Procedure: CAST APPLICATION;   Surgeon: Carole Civil, MD;  Location: [facility];  Service: Orthopedics;  Laterality: Right;   FOOT SURGERY     INCISION AND DRAINAGE Right 11/02/2021   Procedure: INCISION AND DRAINAGE RIGHT KNEE;  Surgeon: Carole Civil, MD;  Location: [facility];  Service: Orthopedics;  Laterality: Right;   IR 3D INDEPENDENT WKST  10/11/2021   IR 3D INDEPENDENT WKST  02/28/2022   IR 3D INDEPENDENT WKST  03/14/2022   IR ANGIOGRAM SELECTIVE EACH ADDITIONAL VESSEL  10/11/2021   IR ANGIOGRAM SELECTIVE EACH ADDITIONAL VESSEL  10/11/2021   IR ANGIOGRAM SELECTIVE EACH ADDITIONAL VESSEL  10/11/2021   IR ANGIOGRAM SELECTIVE EACH ADDITIONAL VESSEL  10/11/2021   IR ANGIOGRAM SELECTIVE EACH ADDITIONAL VESSEL  10/11/2021   IR ANGIOGRAM SELECTIVE EACH ADDITIONAL VESSEL  10/11/2021   IR ANGIOGRAM SELECTIVE EACH ADDITIONAL VESSEL  10/25/2021   IR ANGIOGRAM SELECTIVE EACH ADDITIONAL VESSEL  10/25/2021   IR ANGIOGRAM SELECTIVE EACH ADDITIONAL VESSEL  02/28/2022   IR ANGIOGRAM SELECTIVE EACH ADDITIONAL VESSEL  02/28/2022   IR ANGIOGRAM SELECTIVE EACH ADDITIONAL VESSEL  02/28/2022   IR ANGIOGRAM SELECTIVE EACH ADDITIONAL VESSEL  03/14/2022   IR ANGIOGRAM VISCERAL SELECTIVE  10/11/2021   IR ANGIOGRAM VISCERAL SELECTIVE  02/28/2022   IR ANGIOGRAM VISCERAL SELECTIVE  03/14/2022   IR EMBO TUMOR ORGAN ISCHEMIA INFARCT INC GUIDE ROADMAPPING  10/25/2021   IR EMBO TUMOR ORGAN ISCHEMIA INFARCT INC GUIDE ROADMAPPING  10/25/2021   IR EMBO TUMOR ORGAN  ISCHEMIA INFARCT INC GUIDE ROADMAPPING  03/14/2022   IR RADIOLOGIST EVAL & MGMT  08/23/2021   IR RADIOLOGIST EVAL & MGMT  11/21/2021   IR RADIOLOGIST EVAL & MGMT  01/24/2022   IR US GUIDE VASC ACCESS RIGHT  10/11/2021   IR US GUIDE VASC ACCESS RIGHT  10/25/2021   IR US GUIDE VASC ACCESS RIGHT  02/28/2022   IR US GUIDE VASC ACCESS RIGHT  03/14/2022   MANDIBLE SURGERY     QUADRICEPS TENDON REPAIR Right 09/04/2021   Procedure: REPAIR QUADRICEP TENDON;  Surgeon: Carole Civil, MD;  Location: [facility];  Service:  Orthopedics;  Laterality: Right;   TENDON EXPLORATION Right 11/02/2021   Procedure: QUADRICEPS TENDON AND PATELLAR TENDON EXPLORATION;  Surgeon: Carole Civil, MD;  Location: [facility];  Service: Orthopedics;  Laterality: Right;    Allergies: Ultram [tramadol] and Benadryl [diphenhydramine]  Medications: Prior to Admission medications   Medication Sig Start Date End Date Taking? Authorizing Provider  albuterol (VENTOLIN HFA) 108 (90 Base) MCG/ACT inhaler Inhale 2 puffs into the lungs every 6 (six) hours as needed for wheezing or shortness of breath.    [provider]  Cholecalciferol (VITAMIN D-3) 125 MCG (5000 UT) TABS Take 5,000 Units by mouth daily.    [provider]  citalopram (CELEXA) 20 MG tablet Take 20 mg by mouth daily.    [provider]  cyclobenzaprine (FLEXERIL) 5 MG tablet Take 5 mg by mouth daily as needed. 03/07/22   [provider]  gabapentin (NEURONTIN) 600 MG tablet Take 600 mg by mouth in the morning and at bedtime. 06/24/21   [provider]  meloxicam (MOBIC) 15 MG tablet Take 15 mg by mouth daily as needed. 03/09/22   [provider]  meloxicam (MOBIC) 7.5 MG tablet Take 7.5 mg by mouth in the morning and at bedtime.    [provider]  metFORMIN (GLUCOPHAGE) 500 MG tablet Take 1 tablet (500 mg total) by mouth 2 (two) times daily with a meal. 01/01/18   Julianne Rice, MD  naloxone [facility]) nasal spray 4 mg/0.1 mL SMARTSIG:1 Both Nares Daily 01/01/22   [provider]  omeprazole (PRILOSEC) 40 MG capsule Take 1 capsule (40 mg total) by mouth daily before breakfast. 06/06/21   Erenest Rasher, PA-C  oxyCODONE-acetaminophen (PERCOCET) 10-325 MG tablet Take 1 tablet by mouth every 4 (four) hours as needed for pain. 04/08/22   Carole Civil, MD  QUEtiapine (SEROQUEL) 100 MG tablet Take 100 mg by mouth at bedtime.    [provider]     Family History  Problem Relation Age of Onset    Heart failure Mother    Stroke Father    Stroke Sister    Cancer Sister        breast cancer   Colon cancer Neg Hx    Liver cancer Neg Hx     Social History   Socioeconomic History   Marital status: Legally Separated    Spouse name: Not on file   Number of children: Not on file   Years of education: Not on file   Highest education level: Not on file  Occupational History   Not on file  Tobacco Use   Smoking status: Every Day    Packs/day: 0.50    Years: 20.00    Total pack years: 10.00    Types: Cigarettes   Smokeless tobacco: Never  Vaping Use   Vaping Use: Never used  Substance and Sexual Activity  Alcohol use: Not Currently    Comment: Quit 11 months ago. Used to drink about 6 pack a day. (documented 06/06/21)   Drug use: Yes    Types: Marijuana    Comment: occas   Sexual activity: Yes    Birth control/protection: None  Other Topics Concern   Not on file  Social History Narrative   Not on file   Social Determinants of Health   Financial Resource Strain: Not on file  Food Insecurity: Not on file  Transportation Needs: Not on file  Physical Activity: Not on file  Stress: Not on file  Social Connections: Not on file     Vital Signs: There were no vitals taken for this visit.  No physical examination was performed in lieu of virtual telephone clinic visit.   Imaging: Mapping study 02/28/22    Post Seg VIII TARE (03/14/22)    Labs: No interval labs.   Assessment and Plan: 56 year old male with history of alcoholic cirrhosis (Child Pugh A, ALBI 2) and multifocal, bilobar hepatocellular carcinoma now status post left lobar hepatic transarterial radioembolization on 10/25/21 and segment VIII radiation segmentectomy on 03/14/22.  Overall he is recovering well after the procedure with some continued fatigue.    -plan for MRI abdomen in early October to assess treatment response -CBC, CMP, INR, a-FP to be drawn at that time -follow up with IR clinic visit  shortly thereafter to review results   Electronically Signed: Rosanne Ashing Ambrosia Wisnewski 04/09/2022, 10:05 AM   I spent a total of 25 Minutes in virtual telephone clinical consultation, greater than 50% of which was counseling/coordinating care for hepatocellular carcinoma.

## 2022-04-10 ENCOUNTER — Encounter (HOSPITAL_COMMUNITY): Payer: Self-pay | Admitting: Physical Therapy

## 2022-04-15 ENCOUNTER — Other Ambulatory Visit: Payer: Self-pay | Admitting: Orthopedic Surgery

## 2022-04-15 DIAGNOSIS — T8189XD Other complications of procedures, not elsewhere classified, subsequent encounter: Secondary | ICD-10-CM

## 2022-04-15 MED ORDER — OXYCODONE-ACETAMINOPHEN 10-325 MG PO TABS: 1.0000 | ORAL_TABLET | ORAL | 0 refills | Status: DC | PRN

## 2022-04-18 ENCOUNTER — Other Ambulatory Visit: Payer: Self-pay | Admitting: Orthopedic Surgery

## 2022-04-18 DIAGNOSIS — T8189XD Other complications of procedures, not elsewhere classified, subsequent encounter: Secondary | ICD-10-CM

## 2022-04-18 MED ORDER — OXYCODONE-ACETAMINOPHEN 10-325 MG PO TABS: 1.0000 | ORAL_TABLET | ORAL | 0 refills | Status: DC | PRN

## 2022-04-18 NOTE — Telephone Encounter (Signed)
Danny [name] came in the office and states [facility] does not have the medication for hiim and she called [facility] in [place] and they have it in stock, she wants [place] to please transfer to [facility].   He took his last pain pill yesterday morning.    oxyCODONE-acetaminophen (PERCOCET) 10-325 MG tablet  [facility]arm[facility]

## 2022-04-19 ENCOUNTER — Encounter: Payer: Self-pay | Admitting: Orthopedic Surgery

## 2022-04-20 ENCOUNTER — Encounter (HOSPITAL_COMMUNITY): Payer: Self-pay

## 2022-04-20 ENCOUNTER — Emergency Department (HOSPITAL_COMMUNITY): Payer: Medicaid Other

## 2022-04-20 ENCOUNTER — Emergency Department (HOSPITAL_COMMUNITY)
Admission: EM | Admit: 2022-04-20 | Discharge: 2022-04-20 | Disposition: A | Discharge status: Home / Self Care | Payer: Medicaid Other | Attending: Emergency Medicine | Admitting: Emergency Medicine

## 2022-04-20 ENCOUNTER — Other Ambulatory Visit: Payer: Self-pay

## 2022-04-20 DIAGNOSIS — E119 Type 2 diabetes mellitus without complications: Secondary | ICD-10-CM | POA: Diagnosis not present

## 2022-04-20 DIAGNOSIS — Z7984 Long term (current) use of oral hypoglycemic drugs: Secondary | ICD-10-CM | POA: Insufficient documentation

## 2022-04-20 DIAGNOSIS — Z79899 Other long term (current) drug therapy: Secondary | ICD-10-CM | POA: Insufficient documentation

## 2022-04-20 DIAGNOSIS — X58XXXA Exposure to other specified factors, initial encounter: Secondary | ICD-10-CM | POA: Diagnosis not present

## 2022-04-20 DIAGNOSIS — S81801A Unspecified open wound, right lower leg, initial encounter: Secondary | ICD-10-CM | POA: Insufficient documentation

## 2022-04-20 DIAGNOSIS — Z7951 Long term (current) use of inhaled steroids: Secondary | ICD-10-CM | POA: Diagnosis not present

## 2022-04-20 DIAGNOSIS — J449 Chronic obstructive pulmonary disease, unspecified: Secondary | ICD-10-CM | POA: Diagnosis not present

## 2022-04-20 DIAGNOSIS — I10 Essential (primary) hypertension: Secondary | ICD-10-CM | POA: Insufficient documentation

## 2022-04-20 DIAGNOSIS — S91101A Unspecified open wound of right great toe without damage to nail, initial encounter: Secondary | ICD-10-CM | POA: Insufficient documentation

## 2022-04-20 DIAGNOSIS — S91109A Unspecified open wound of unspecified toe(s) without damage to nail, initial encounter: Secondary | ICD-10-CM

## 2022-04-20 DIAGNOSIS — L089 Local infection of the skin and subcutaneous tissue, unspecified: Secondary | ICD-10-CM | POA: Insufficient documentation

## 2022-04-20 LAB — CBC WITH DIFFERENTIAL/PLATELET
Abs Immature Granulocytes: 0.01 10*3/uL (ref 0.00–0.07)
Basophils Absolute: 0 10*3/uL (ref 0.0–0.1)
Basophils Relative: 1 %
Eosinophils Absolute: 0.2 10*3/uL (ref 0.0–0.5)
Eosinophils Relative: 4 %
HCT: 36.7 % — ABNORMAL LOW (ref 39.0–52.0)
Hemoglobin: 12.5 g/dL — ABNORMAL LOW (ref 13.0–17.0)
Immature Granulocytes: 0 %
Lymphocytes Relative: 8 %
Lymphs Abs: 0.4 10*3/uL — ABNORMAL LOW (ref 0.7–4.0)
MCH: 31.6 pg (ref 26.0–34.0)
MCHC: 34.1 g/dL (ref 30.0–36.0)
MCV: 92.9 fL (ref 80.0–100.0)
Monocytes Absolute: 0.4 10*3/uL (ref 0.1–1.0)
Monocytes Relative: 9 %
Neutro Abs: 3.7 10*3/uL (ref 1.7–7.7)
Neutrophils Relative %: 78 %
Platelets: 108 10*3/uL — ABNORMAL LOW (ref 150–400)
RBC: 3.95 MIL/uL — ABNORMAL LOW (ref 4.22–5.81)
RDW: 13.2 % (ref 11.5–15.5)
WBC: 4.7 10*3/uL (ref 4.0–10.5)
nRBC: 0 % (ref 0.0–0.2)

## 2022-04-20 LAB — BASIC METABOLIC PANEL
Anion gap: 8 (ref 5–15)
BUN: 9 mg/dL (ref 6–20)
CO2: 26 mmol/L (ref 22–32)
Calcium: 9.2 mg/dL (ref 8.9–10.3)
Chloride: 103 mmol/L (ref 98–111)
Creatinine, Ser: 0.78 mg/dL (ref 0.61–1.24)
GFR, Estimated: >60 mL/min (ref >60)
Glucose, Bld: 146 mg/dL — ABNORMAL HIGH (ref 70–99)
Potassium: 3.8 mmol/L (ref 3.5–5.1)
Sodium: 137 mmol/L (ref 135–145)

## 2022-04-20 MED ORDER — CEPHALEXIN 500 MG PO CAPS: 500.0000 mg | ORAL_CAPSULE | Freq: Four times a day (QID) | ORAL | 0 refills | Status: DC

## 2022-04-20 MED ORDER — OXYCODONE-ACETAMINOPHEN 5-325 MG PO TABS
2.0000 | ORAL_TABLET | Freq: Once | ORAL | Status: AC
  Administered 2022-04-20: 2 via ORAL
  Filled 2022-04-20: qty 2

## 2022-04-20 NOTE — ED Provider Notes (Signed)
[facility] EMERGENCY DEPARTMENT Provider Note   CSN: [id number] Arrival date & time: 04/20/22  4008     History {Add pertinent medical, surgical, social history, OB history to HPI:1} Chief Complaint  Patient presents with   Wound Infection    Danny [name] is a 56 y.o. male.  HPI     Danny [name] is a 56 y.o. male with past medical history type 2 diabetes, prior stroke, hypertension, COPD, hepatitis C, cirrhosis hepatocellular carcinoma who presents to the Emergency Department from home requesting evaluation of an open wound of his right shin and right great toe.  Patient had knee surgery earlier this year and wore a knee brace that rubbed a "sore" on his right shin.  He has a nurses aide who is also a family member and POA who has been caring for his wounds.  She has been cleaning the area and apply Neosporin and bandage.  She states she was instructed 3 days ago to discontinue wound care.  She also noticed a "hole "along the lateral aspect of the right great toe.  Unsure how long this wound has been present.  Denies known injury.  No surrounding redness or red streaking.  Patient complains of pain to his leg and great toe.  He is scheduled to see vascular surgery for upcoming procedure of his right leg  He currently has PT but no wound care.  Patient denies any fever, chills, decreased appetite, nausea vomiting, chest pain or shortness of breath.     Home Medications Prior to Admission medications   Medication Sig Start Date End Date Taking? Authorizing Provider  albuterol (VENTOLIN HFA) 108 (90 Base) MCG/ACT inhaler Inhale 2 puffs into the lungs every 6 (six) hours as needed for wheezing or shortness of breath.    [provider]  Cholecalciferol (VITAMIN D-3) 125 MCG (5000 UT) TABS Take 5,000 Units by mouth daily.    [provider]  citalopram (CELEXA) 20 MG tablet Take 20 mg by mouth daily.    [provider]  cyclobenzaprine (FLEXERIL) 5 MG  tablet Take 5 mg by mouth daily as needed. 03/07/22   [provider]  gabapentin (NEURONTIN) 600 MG tablet Take 600 mg by mouth in the morning and at bedtime. 06/24/21   [provider]  meloxicam (MOBIC) 15 MG tablet Take 15 mg by mouth daily as needed. 03/09/22   [provider]  meloxicam (MOBIC) 7.5 MG tablet Take 7.5 mg by mouth in the morning and at bedtime.    [provider]  metFORMIN (GLUCOPHAGE) 500 MG tablet Take 1 tablet (500 mg total) by mouth 2 (two) times daily with a meal. 01/01/18   Julianne Rice, MD  naloxone [facility]) nasal spray 4 mg/0.1 mL SMARTSIG:1 Both Nares Daily 01/01/22   [provider]  omeprazole (PRILOSEC) 40 MG capsule Take 1 capsule (40 mg total) by mouth daily before breakfast. 06/06/21   Erenest Rasher, PA-C  oxyCODONE-acetaminophen (PERCOCET) 10-325 MG tablet Take 1 tablet by mouth every 4 (four) hours as needed for pain. 04/18/22   Carole Civil, MD  QUEtiapine (SEROQUEL) 100 MG tablet Take 100 mg by mouth at bedtime.    [provider]      Allergies    Ultram [tramadol] and Benadryl [diphenhydramine]    Review of Systems   Review of Systems  Constitutional:  Negative for activity change, appetite change and fever.  Respiratory:  Negative for shortness of breath.   Cardiovascular:  Negative for chest pain.  Gastrointestinal:  Negative for abdominal pain, diarrhea, nausea and vomiting.  Genitourinary:  Negative for dysuria.  Musculoskeletal:  Negative for arthralgias and myalgias.  Skin:  Positive for color change and wound.  Neurological:  Negative for dizziness, weakness and numbness.    Physical Exam Updated Vital Signs BP 112/75 (BP Location: Right Arm)   Pulse (!) 104   Temp 98.5 F (36.9 C) (Oral)   Resp 16   Ht '5\' 9"'$  (1.753 m)   Wt 70.3 kg   SpO2 93%   BMI 22.89 kg/m  Physical Exam Vitals and nursing note reviewed.  Constitutional:      Appearance: Normal appearance. He is  not toxic-appearing.  Cardiovascular:     Rate and Rhythm: Normal rate and regular rhythm.     Pulses: Normal pulses.  Pulmonary:     Effort: Pulmonary effort is normal.  Abdominal:     Palpations: Abdomen is soft.     Tenderness: There is no abdominal tenderness.  Musculoskeletal:        General: No swelling. Normal range of motion.     Right lower leg: No edema.     Left lower leg: No edema.  Skin:    General: Skin is warm.     Capillary Refill: Capillary refill takes 2 to 3 seconds.     Findings: No erythema.     Comments: 4 cm chronic appearing wound to the anterior right lower leg.  Appears to be healing.  No purulent drainage.  There is an adjacent 5 cm area of erythema appears consistent with skin breakdown.  Ulceration to the lateral aspect of the right great toe.  See attached photos  Neurological:     General: No focal deficit present.     Mental Status: He is alert.     Sensory: No sensory deficit.     Motor: No weakness.          ED Results / Procedures / Treatments   Labs (all labs ordered are listed, but only abnormal results are displayed) Labs Reviewed  CBC WITH DIFFERENTIAL/PLATELET  BASIC METABOLIC PANEL    EKG None  Radiology No results found.  Procedures Procedures  {Document cardiac monitor, telemetry assessment procedure when appropriate:1}  Medications Ordered in ED Medications  oxyCODONE-acetaminophen (PERCOCET/ROXICET) 5-325 MG per tablet 2 tablet (has no administration in time range)    ED Course/ Medical Decision Making/ A&P                           Medical Decision Making Patient here for evaluation of nonhealing wound of the right lower leg and right great toe.  No history of trauma.  Family member who is also POA and nurse aide was cleaning the wound and bandaging daily but was instructed 3 days ago by her supervisor to stop wound care.  When bandage was removed by me today, there is an adjacent area of skin breakdown the  patient states was not there 3 days ago.  He also has a chronic ulceration of the right great toe  On exam, patient well-appearing nontoxic.  Vital signs are reassuring.  Differential would include cellulitis, will wound infection, osteomyelitis, sepsis.  Clinically I suspect these are chronic wounds secondary to his peripheral vascular disease and diabetes.  I have low clinical suspicion for sepsis or osteomyelitis.  His work-up will include labs and x-rays  Amount and/or Complexity of Data Reviewed Labs: ordered.  Radiology: ordered.  Risk Prescription drug management.   ***  {Document critical care time when appropriate:1} {Document review of labs and clinical decision tools ie heart score, Chads2Vasc2 etc:1}  {Document your independent review of radiology images, and any outside records:1} {Document your discussion with family members, caretakers, and with consultants:1} {Document social determinants of health affecting pt's care:1} {Document your decision making why or why not admission, treatments were needed:1} Final Clinical Impression(s) / ED Diagnoses Final diagnoses:  None    Rx / DC Orders ED Discharge Orders     None

## 2022-04-20 NOTE — Discharge Instructions (Signed)
Gently clean the wounds with mild soap and water, apply wet to dry dressings daily.  Take the antibiotic as directed.  Follow-up with your primary care provider early next week for recheck.  Return to the emergency department for any new or worsening symptoms.  Someone from wound care should be contacting you on Monday to arrange follow-up.

## 2022-04-20 NOTE — ED Triage Notes (Signed)
"  In December he had knee surgery on tendon, after the surgery the incision was not healing right so he had to have surgery in February to fix that and they left it an open wound. It is now healed. The knee brace rubbed his right shin and now that wound is getting larger and worse. Also recently noticed in between his left great toe and 2nd toe there is a hole" per niece (POA)

## 2022-04-22 DIAGNOSIS — S81801A Unspecified open wound, right lower leg, initial encounter: Secondary | ICD-10-CM | POA: Insufficient documentation

## 2022-04-22 NOTE — Telephone Encounter (Signed)
Please have them call vascular surgery ;  its a vascular inflow issue

## 2022-04-22 NOTE — Telephone Encounter (Signed)
Spoke w/ Mateo Flow, pt's caregiver. She verbalizes understanding of treatment plan to call vascular surgery. Pt was seen in the ED this weekend and referred to vascular. She will call today and see if she can get him a sooner appt.

## 2022-04-23 ENCOUNTER — Other Ambulatory Visit: Payer: Self-pay

## 2022-04-23 ENCOUNTER — Other Ambulatory Visit: Payer: Self-pay | Admitting: Orthopedic Surgery

## 2022-04-23 DIAGNOSIS — I70235 Atherosclerosis of native arteries of right leg with ulceration of other part of foot: Secondary | ICD-10-CM

## 2022-04-23 DIAGNOSIS — T8189XD Other complications of procedures, not elsewhere classified, subsequent encounter: Secondary | ICD-10-CM

## 2022-04-23 MED ORDER — OXYCODONE-ACETAMINOPHEN 10-325 MG PO TABS: 1.0000 | ORAL_TABLET | ORAL | 0 refills | Status: DC | PRN

## 2022-04-26 ENCOUNTER — Other Ambulatory Visit: Payer: Self-pay | Admitting: Orthopedic Surgery

## 2022-04-26 DIAGNOSIS — T8189XD Other complications of procedures, not elsewhere classified, subsequent encounter: Secondary | ICD-10-CM

## 2022-04-28 ENCOUNTER — Other Ambulatory Visit: Payer: Self-pay | Admitting: Orthopedic Surgery

## 2022-04-28 DIAGNOSIS — T8189XD Other complications of procedures, not elsewhere classified, subsequent encounter: Secondary | ICD-10-CM

## 2022-04-29 ENCOUNTER — Other Ambulatory Visit: Payer: Self-pay | Admitting: Orthopedic Surgery

## 2022-04-29 ENCOUNTER — Ambulatory Visit (HOSPITAL_COMMUNITY): Payer: Medicaid Other | Attending: Orthopedic Surgery | Admitting: Physical Therapy

## 2022-04-29 ENCOUNTER — Encounter (HOSPITAL_COMMUNITY): Payer: Self-pay | Admitting: Physical Therapy

## 2022-04-29 DIAGNOSIS — S76111D Strain of right quadriceps muscle, fascia and tendon, subsequent encounter: Secondary | ICD-10-CM | POA: Insufficient documentation

## 2022-04-29 DIAGNOSIS — R2689 Other abnormalities of gait and mobility: Secondary | ICD-10-CM | POA: Insufficient documentation

## 2022-04-29 DIAGNOSIS — T8189XD Other complications of procedures, not elsewhere classified, subsequent encounter: Secondary | ICD-10-CM

## 2022-04-29 NOTE — Therapy (Signed)
OUTPATIENT PHYSICAL THERAPY LOWER EXTREMITY EVALUATION   Patient Name: Danny [name] MRN: [id number] DOB:1966-04-22, 56 y.o., male Today's Date: 04/29/2022   PT End of Session - 04/29/22 1200     Visit Number 1    Number of Visits 12    Date for PT Re-Evaluation 06/10/22    Authorization Type Medicaid - [place] Time Period Submitted for 11 visits at eval, check auth)    Authorization - Visit Number 1    Authorization - Number of Visits 1    PT Start Time 1130    PT Stop Time 1200    PT Time Calculation (min) 30 min    Activity Tolerance Patient tolerated treatment well;Patient limited by fatigue    Behavior During Therapy [facility] for tasks assessed/performed             Past Medical History:  Diagnosis Date   Alcohol abuse    Anxiety    Back pain    CVA (cerebral infarction)    Depression    Diabetes mellitus    Hypertension    Migraines    Stroke [facility])    Past Surgical History:  Procedure Laterality Date   CAST APPLICATION Right 02/03/7823   Procedure: CAST APPLICATION;  Surgeon: Carole Civil, MD;  Location: [facility] ORS;  Service: Orthopedics;  Laterality: Right;   FOOT SURGERY     INCISION AND DRAINAGE Right 11/02/2021   Procedure: INCISION AND DRAINAGE RIGHT KNEE;  Surgeon: Carole Civil, MD;  Location: [facility] ORS;  Service: Orthopedics;  Laterality: Right;   IR 3D INDEPENDENT WKST  10/11/2021   IR 3D INDEPENDENT WKST  02/28/2022   IR 3D INDEPENDENT WKST  03/14/2022   IR ANGIOGRAM SELECTIVE EACH ADDITIONAL VESSEL  10/11/2021   IR ANGIOGRAM SELECTIVE EACH ADDITIONAL VESSEL  10/11/2021   IR ANGIOGRAM SELECTIVE EACH ADDITIONAL VESSEL  10/11/2021   IR ANGIOGRAM SELECTIVE EACH ADDITIONAL VESSEL  10/11/2021   IR ANGIOGRAM SELECTIVE EACH ADDITIONAL VESSEL  10/11/2021   IR ANGIOGRAM SELECTIVE EACH ADDITIONAL VESSEL  10/11/2021   IR ANGIOGRAM SELECTIVE EACH ADDITIONAL VESSEL  10/25/2021   IR ANGIOGRAM SELECTIVE EACH ADDITIONAL VESSEL  10/25/2021   IR ANGIOGRAM  SELECTIVE EACH ADDITIONAL VESSEL  02/28/2022   IR ANGIOGRAM SELECTIVE EACH ADDITIONAL VESSEL  02/28/2022   IR ANGIOGRAM SELECTIVE EACH ADDITIONAL VESSEL  02/28/2022   IR ANGIOGRAM SELECTIVE EACH ADDITIONAL VESSEL  03/14/2022   IR ANGIOGRAM VISCERAL SELECTIVE  10/11/2021   IR ANGIOGRAM VISCERAL SELECTIVE  02/28/2022   IR ANGIOGRAM VISCERAL SELECTIVE  03/14/2022   IR EMBO TUMOR ORGAN ISCHEMIA INFARCT INC GUIDE ROADMAPPING  10/25/2021   IR EMBO TUMOR ORGAN ISCHEMIA INFARCT INC GUIDE ROADMAPPING  10/25/2021   IR EMBO TUMOR ORGAN ISCHEMIA INFARCT INC GUIDE ROADMAPPING  03/14/2022   IR RADIOLOGIST EVAL & MGMT  08/23/2021   IR RADIOLOGIST EVAL & MGMT  11/21/2021   IR RADIOLOGIST EVAL & MGMT  01/24/2022   IR RADIOLOGIST EVAL & MGMT  04/09/2022   IR US GUIDE VASC ACCESS RIGHT  10/11/2021   IR US GUIDE VASC ACCESS RIGHT  10/25/2021   IR US GUIDE VASC ACCESS RIGHT  02/28/2022   IR US GUIDE VASC ACCESS RIGHT  03/14/2022   MANDIBLE SURGERY     QUADRICEPS TENDON REPAIR Right 09/04/2021   Procedure: REPAIR QUADRICEP TENDON;  Surgeon: Carole Civil, MD;  Location: [facility];  Service: Orthopedics;  Laterality: Right;   TENDON EXPLORATION Right 11/02/2021   Procedure: QUADRICEPS TENDON  AND PATELLAR TENDON EXPLORATION;  Surgeon: Carole Civil, MD;  Location: [facility];  Service: Orthopedics;  Laterality: Right;   Patient Active Problem List   Diagnosis Date Noted   Wound infection after surgery 09/27/2021   Postoperative wound cellulitis 09/26/2021   Rupture of right quadriceps tendon    Chronic hepatitis C without hepatic coma ([place]) 08/08/2021   Hepatocellular carcinoma ([place]) 08/08/2021   COPD (chronic obstructive pulmonary disease) ([place]) 08/08/2021   Hypertension 08/08/2021   Other cirrhosis of liver ([place]) 08/08/2021   Type 2 diabetes mellitus ([place]) 08/08/2021   Generalized abdominal pain 06/06/2021   Dysphagia 06/06/2021   Gastroesophageal reflux disease 06/06/2021   Abnormal liver ultrasound 06/06/2021   Colon  cancer screening 06/06/2021   Elevated LFTs 06/06/2021   GREATER TROCHANTERIC BURSITIS 07/03/2007   INCREASED BLOOD PRESSURE 07/03/2007   BACK PAIN, LUMBAR 05/29/2007   NUMBNESS 05/29/2007   INSOMNIA, CHRONIC 05/13/2007   HYPERLIPIDEMIA 01/13/2007   ANXIETY DEPRESSION 01/13/2007   SYNDROME, CHRONIC PAIN 01/13/2007   CVA 01/13/2007   ARTHRITIS 01/13/2007    PCP: Blenda Nicely NP  REFERRING PROVIDER: Sindy Messing MD  REFERRING DIAG: S76.111D (ICD-10-CM) - Rupture of right quadriceps tendon, subsequent encounter   THERAPY DIAG:  Other abnormalities of gait and mobility - Plan: PT plan of care cert/re-cert  Rationale for Evaluation and Treatment Rehabilitation  ONSET DATE: 09/04/21  SUBJECTIVE:   SUBJECTIVE STATEMENT: Patient presents to therapy with complaint of RLE weakness s/p Rt quad tendon rupture and repair 09/04/21. He reports ongoing difficulty with RLE, and notable weakness. He has been seen in this clinic previously for wound care, which he reports also having ongoing trouble with wound healing of RLE.   PERTINENT HISTORY: RT quad rupture   PAIN:  Are you having pain? Yes: NPRS scale: 8/10 Pain location: Rt knee Pain description: tingling, sharp, shooting Aggravating factors: WB, bending  Relieving factors: non WB, rest   PRECAUTIONS: Fall  WEIGHT BEARING RESTRICTIONS No  FALLS:  Has patient fallen in last 6 months? Yes. Number of falls 4-5  LIVING ENVIRONMENT: Lives with: lives with their family Lives in: House/apartment Stairs: Yes: External: 3 steps; can reach both Has following equipment at home: Single point cane and Walker - 2 wheeled  OCCUPATION: Disability   PLOF: Independent with basic ADLs  PATIENT GOALS  "Get my leg stronger"    OBJECTIVE:   DIAGNOSTIC FINDINGS: NA    COGNITION:  Overall cognitive status: Within functional limits for tasks assessed    LOWER EXTREMITY ROM:  Active ROM Right eval Left eval  Hip flexion     Hip extension    Hip abduction    Hip adduction    Hip internal rotation    Hip external rotation    Knee flexion 130   Knee extension 0   Ankle dorsiflexion    Ankle plantarflexion    Ankle inversion    Ankle eversion     (Blank rows = not tested)  LOWER EXTREMITY MMT:  MMT Right eval Left eval  Hip flexion 4- 4+  Hip extension    Hip abduction 2+ 4+  Hip adduction    Hip internal rotation    Hip external rotation    Knee flexion 3+ 4+  Knee extension 2+ 4+  Ankle dorsiflexion    Ankle plantarflexion    Ankle inversion    Ankle eversion     (Blank rows = not tested)   FUNCTIONAL TESTS:  115 feet with RW  GAIT:  Distance walked: 115 feet Assistive device utilized: Walker - 2 wheeled Level of assistance: Modified independence and SBA Comments: decreased RLE swing    TODAY'S TREATMENT: Eval HEP development    PATIENT EDUCATION:  Education details: on eval findings, POC and HEP  Person educated: Patient Education method: Explanation Education comprehension: verbalized understanding   HOME EXERCISE PROGRAM: Access Code: [email or web address] URL: [email or web address] Date: 04/29/2022 Prepared by: Josue Hector  Exercises - Supine Quad Set  - 2 x daily - 7 x weekly - 2 sets - 10 reps - Supine Heel Slide  - 2 x daily - 7 x weekly - 2 sets - 10 reps - Sit to Stand with Counter Support  - 2 x daily - 7 x weekly - 2 sets - 5 reps  ASSESSMENT:  CLINICAL IMPRESSION: Patient is a 56 y.o. male who presents to physical therapy with complaint of RLE weakness. Patient demonstrates muscle weakness, decreased activity tolerance and gait abnormalities which are likely contributing to symptoms of pain and are negatively impacting patient ability to perform ADLs and functional mobility tasks. Patient will benefit from skilled physical therapy services to address these deficits to reduce pain and improve level of function with ADLs and functional mobility  tasks.    OBJECTIVE IMPAIRMENTS Abnormal gait, decreased activity tolerance, decreased balance, decreased mobility, difficulty walking, decreased strength, improper body mechanics, and pain.   ACTIVITY LIMITATIONS bending, standing, squatting, stairs, transfers, and locomotion level  PARTICIPATION LIMITATIONS: meal prep, cleaning, laundry, shopping, community activity, and yard work  PERSONAL FACTORS Past/current experiences and Time since onset of injury/illness/exacerbation are also affecting patient's functional outcome.   REHAB POTENTIAL: Good  CLINICAL DECISION MAKING: Stable/uncomplicated  EVALUATION COMPLEXITY: Low   GOALS: SHORT TERM GOALS: Target date: 05/20/2022  Patient will be independent with initial HEP and self-management strategies to improve functional outcomes Baseline:  Goal status: INITIAL   LONG TERM GOALS: Target date: 06/10/2022  Patient will be independent with advanced HEP and self-management strategies to improve functional outcomes Baseline:  Goal status: INITIAL  2. Patient will be able to perform stand x 5 in < 30 seconds to demonstrate improvement in functional mobility and reduced risk for falls.  Baseline: slow, labored movement with sit to stand, needs UE assist  Goal status: INITIAL  3. Patient will have equal to or > 4/5 MMT throughout RLE to improve ability to perform functional mobility, stair ambulation and ADLs.  Baseline: See MMT Goal status: INITIAL  4. Patient will be able to ambulate at least 250 feet during 2MWT with LRAD to demonstrate improved ability to perform functional mobility and associated tasks. Baseline: 115 feet with RW Goal status: INITIAL   PLAN: PT FREQUENCY: 1-2x/week  PT DURATION: 6 weeks  PLANNED INTERVENTIONS: Therapeutic exercises, Therapeutic activity, Neuromuscular re-education, Balance training, Gait training, Patient/Family education, Joint manipulation, Joint mobilization, Stair training, Aquatic  Therapy, Dry Needling, Electrical stimulation, Spinal manipulation, Spinal mobilization, Cryotherapy, Moist heat, scar mobilization, Taping, Traction, Ultrasound, Biofeedback, Ionotophoresis '4mg'$ /ml Dexamethasone, and Manual therapy.   PLAN FOR NEXT SESSION: Complete functional survey if needed (LEFS), 15 min late to eval, so no time. Progress RT glute and hip strengthening as able.   12:01 PM, 04/29/22 Josue Hector PT DPT  Physical Therapist with [facility]  [phone]

## 2022-04-30 MED ORDER — OXYCODONE-ACETAMINOPHEN 10-325 MG PO TABS: 1.0000 | ORAL_TABLET | ORAL | 0 refills | Status: DC | PRN

## 2022-05-03 ENCOUNTER — Encounter: Payer: Self-pay | Admitting: Orthopedic Surgery

## 2022-05-03 DIAGNOSIS — T8189XD Other complications of procedures, not elsewhere classified, subsequent encounter: Secondary | ICD-10-CM

## 2022-05-06 ENCOUNTER — Observation Stay (HOSPITAL_COMMUNITY)
Admission: RE | Admit: 2022-05-06 | Discharge: 2022-05-07 | Disposition: A | Discharge status: Home / Self Care | Payer: Medicaid Other | Attending: Vascular Surgery | Admitting: Vascular Surgery

## 2022-05-06 ENCOUNTER — Other Ambulatory Visit: Payer: Self-pay | Admitting: Orthopedic Surgery

## 2022-05-06 ENCOUNTER — Other Ambulatory Visit: Payer: Self-pay

## 2022-05-06 ENCOUNTER — Encounter (HOSPITAL_COMMUNITY)
Admission: RE | Disposition: A | Discharge status: Home / Self Care | Payer: Self-pay | Source: Home / Self Care | Attending: Vascular Surgery

## 2022-05-06 DIAGNOSIS — Z79899 Other long term (current) drug therapy: Secondary | ICD-10-CM | POA: Diagnosis not present

## 2022-05-06 DIAGNOSIS — I739 Peripheral vascular disease, unspecified: Secondary | ICD-10-CM | POA: Diagnosis present

## 2022-05-06 DIAGNOSIS — E119 Type 2 diabetes mellitus without complications: Secondary | ICD-10-CM | POA: Insufficient documentation

## 2022-05-06 DIAGNOSIS — F17210 Nicotine dependence, cigarettes, uncomplicated: Secondary | ICD-10-CM | POA: Insufficient documentation

## 2022-05-06 DIAGNOSIS — I70235 Atherosclerosis of native arteries of right leg with ulceration of other part of foot: Secondary | ICD-10-CM

## 2022-05-06 DIAGNOSIS — I70221 Atherosclerosis of native arteries of extremities with rest pain, right leg: Principal | ICD-10-CM | POA: Insufficient documentation

## 2022-05-06 DIAGNOSIS — Z8673 Personal history of transient ischemic attack (TIA), and cerebral infarction without residual deficits: Secondary | ICD-10-CM | POA: Diagnosis not present

## 2022-05-06 DIAGNOSIS — Z9889 Other specified postprocedural states: Secondary | ICD-10-CM | POA: Diagnosis present

## 2022-05-06 DIAGNOSIS — Z7984 Long term (current) use of oral hypoglycemic drugs: Secondary | ICD-10-CM | POA: Insufficient documentation

## 2022-05-06 DIAGNOSIS — T8189XD Other complications of procedures, not elsewhere classified, subsequent encounter: Secondary | ICD-10-CM

## 2022-05-06 DIAGNOSIS — I10 Essential (primary) hypertension: Secondary | ICD-10-CM | POA: Insufficient documentation

## 2022-05-06 HISTORY — PX: PERIPHERAL VASCULAR INTERVENTION: CATH118257

## 2022-05-06 HISTORY — PX: ABDOMINAL AORTOGRAM W/LOWER EXTREMITY: CATH118223

## 2022-05-06 LAB — POCT I-STAT, CHEM 8
BUN: 10 mg/dL (ref 6–20)
Calcium, Ion: 1.15 mmol/L (ref 1.15–1.40)
Chloride: 106 mmol/L (ref 98–111)
Creatinine, Ser: 0.6 mg/dL — ABNORMAL LOW (ref 0.61–1.24)
Glucose, Bld: 144 mg/dL — ABNORMAL HIGH (ref 70–99)
HCT: 46 % (ref 39.0–52.0)
Hemoglobin: 15.6 g/dL (ref 13.0–17.0)
Potassium: 3.9 mmol/L (ref 3.5–5.1)
Sodium: 140 mmol/L (ref 135–145)
TCO2: 21 mmol/L — ABNORMAL LOW (ref 22–32)

## 2022-05-06 LAB — GLUCOSE, CAPILLARY: Glucose-Capillary: 160 mg/dL — ABNORMAL HIGH (ref 70–99)

## 2022-05-06 SURGERY — ABDOMINAL AORTOGRAM W/LOWER EXTREMITY
Anesthesia: LOCAL | Laterality: Right

## 2022-05-06 MED ORDER — SODIUM CHLORIDE 0.9% FLUSH
3.0000 mL | INTRAVENOUS | Status: DC | PRN
Start: 2022-05-07 — End: 2022-05-07

## 2022-05-06 MED ORDER — HYDRALAZINE HCL 20 MG/ML IJ SOLN: 5.0000 mg | INTRAMUSCULAR | Status: DC | PRN

## 2022-05-06 MED ORDER — HYDROMORPHONE HCL 1 MG/ML IJ SOLN
INTRAMUSCULAR | Status: AC
  Filled 2022-05-06: qty 0.5

## 2022-05-06 MED ORDER — FENTANYL CITRATE (PF) 100 MCG/2ML IJ SOLN
INTRAMUSCULAR | Status: DC | PRN
  Administered 2022-05-06 (×2): 50 ug via INTRAVENOUS

## 2022-05-06 MED ORDER — HEPARIN (PORCINE) IN NACL 1000-0.9 UT/500ML-% IV SOLN
INTRAVENOUS | Status: AC
  Filled 2022-05-06: qty 1000

## 2022-05-06 MED ORDER — PROTAMINE SULFATE 10 MG/ML IV SOLN
INTRAVENOUS | Status: DC | PRN
  Administered 2022-05-06: 20 mg via INTRAVENOUS
  Administered 2022-05-06: 5 mg via INTRAVENOUS

## 2022-05-06 MED ORDER — ROSUVASTATIN CALCIUM 10 MG PO TABS: 10.0000 mg | ORAL_TABLET | Freq: Every day | ORAL | 11 refills | Status: DC

## 2022-05-06 MED ORDER — SODIUM CHLORIDE 0.9 % WEIGHT BASED INFUSION
1.0000 mL/kg/h | INTRAVENOUS | Status: AC
  Administered 2022-05-06 (×2): 1 mL/kg/h via INTRAVENOUS

## 2022-05-06 MED ORDER — LIDOCAINE HCL (PF) 1 % IJ SOLN
INTRAMUSCULAR | Status: AC
  Filled 2022-05-06: qty 30

## 2022-05-06 MED ORDER — IODIXANOL 320 MG/ML IV SOLN
INTRAVENOUS | Status: DC | PRN
  Administered 2022-05-06: 140 mL

## 2022-05-06 MED ORDER — MIDAZOLAM HCL 2 MG/2ML IJ SOLN
INTRAMUSCULAR | Status: DC | PRN
  Administered 2022-05-06 (×2): 1 mg via INTRAVENOUS

## 2022-05-06 MED ORDER — LABETALOL HCL 5 MG/ML IV SOLN: 10.0000 mg | INTRAVENOUS | Status: DC | PRN

## 2022-05-06 MED ORDER — ROSUVASTATIN CALCIUM 5 MG PO TABS
10.0000 mg | ORAL_TABLET | Freq: Every day | ORAL | Status: DC
  Administered 2022-05-06: 10 mg via ORAL
  Filled 2022-05-06: qty 1
  Filled 2022-05-06: qty 2

## 2022-05-06 MED ORDER — MIDAZOLAM HCL 2 MG/2ML IJ SOLN
INTRAMUSCULAR | Status: AC
  Filled 2022-05-06: qty 2

## 2022-05-06 MED ORDER — SODIUM CHLORIDE 0.9 % IV SOLN
250.0000 mL | INTRAVENOUS | Status: DC | PRN
Start: 2022-05-07 — End: 2022-05-07

## 2022-05-06 MED ORDER — CLOPIDOGREL BISULFATE 75 MG PO TABS: 300.0000 mg | ORAL_TABLET | Freq: Once | ORAL | Status: DC

## 2022-05-06 MED ORDER — OXYCODONE HCL 5 MG PO TABS: 5.0000 mg | ORAL_TABLET | ORAL | Status: DC | PRN

## 2022-05-06 MED ORDER — HEPARIN SODIUM (PORCINE) 1000 UNIT/ML IJ SOLN
INTRAMUSCULAR | Status: DC | PRN
  Administered 2022-05-06: 5000 [IU] via INTRAVENOUS

## 2022-05-06 MED ORDER — ONDANSETRON HCL 4 MG/2ML IJ SOLN: 4.0000 mg | Freq: Four times a day (QID) | INTRAMUSCULAR | Status: DC | PRN

## 2022-05-06 MED ORDER — ACETAMINOPHEN 325 MG PO TABS: 650.0000 mg | ORAL_TABLET | ORAL | Status: DC | PRN

## 2022-05-06 MED ORDER — HEPARIN (PORCINE) IN NACL 1000-0.9 UT/500ML-% IV SOLN
INTRAVENOUS | Status: DC | PRN
  Administered 2022-05-06 (×2): 500 mL

## 2022-05-06 MED ORDER — SODIUM CHLORIDE 0.9% FLUSH
3.0000 mL | Freq: Two times a day (BID) | INTRAVENOUS | Status: DC
  Administered 2022-05-07 (×2): 3 mL via INTRAVENOUS

## 2022-05-06 MED ORDER — FENTANYL CITRATE (PF) 100 MCG/2ML IJ SOLN
INTRAMUSCULAR | Status: AC
  Filled 2022-05-06: qty 2

## 2022-05-06 MED ORDER — LIDOCAINE HCL (PF) 1 % IJ SOLN
INTRAMUSCULAR | Status: DC | PRN
  Administered 2022-05-06: 15 mL

## 2022-05-06 MED ORDER — CLOPIDOGREL BISULFATE 75 MG PO TABS: 75.0000 mg | ORAL_TABLET | Freq: Every day | ORAL | 11 refills | Status: AC

## 2022-05-06 MED ORDER — ASPIRIN 81 MG PO TBEC
81.0000 mg | DELAYED_RELEASE_TABLET | Freq: Every day | ORAL | Status: DC
Start: 2022-05-06 — End: 2022-05-07
  Administered 2022-05-07: 81 mg via ORAL
  Filled 2022-05-06: qty 1

## 2022-05-06 MED ORDER — OXYCODONE-ACETAMINOPHEN 5-325 MG PO TABS
1.0000 | ORAL_TABLET | ORAL | Status: DC | PRN
  Administered 2022-05-06 – 2022-05-07 (×2): 2 via ORAL
  Filled 2022-05-06 (×2): qty 2

## 2022-05-06 MED ORDER — HYDROMORPHONE HCL 1 MG/ML IJ SOLN
0.5000 mg | INTRAMUSCULAR | Status: DC | PRN
  Administered 2022-05-06 (×2): 1 mg via INTRAVENOUS
  Administered 2022-05-06: 0.5 mg via INTRAVENOUS
  Administered 2022-05-06 – 2022-05-07 (×2): 1 mg via INTRAVENOUS
  Filled 2022-05-06 (×4): qty 1

## 2022-05-06 MED ORDER — SODIUM CHLORIDE 0.9 % IV SOLN: INTRAVENOUS | Status: DC

## 2022-05-06 MED ORDER — CLOPIDOGREL BISULFATE 75 MG PO TABS
75.0000 mg | ORAL_TABLET | Freq: Every day | ORAL | Status: DC
  Administered 2022-05-07: 75 mg via ORAL
  Filled 2022-05-06: qty 1

## 2022-05-06 MED ORDER — PROTAMINE SULFATE 10 MG/ML IV SOLN
INTRAVENOUS | Status: AC
Start: 2022-05-06 — End: ?
  Filled 2022-05-06: qty 5

## 2022-05-06 SURGICAL SUPPLY — 19 items
BALLN MUSTANG 6X100X135 (BALLOONS) ×1
BALLN MUSTANG 6X20X135 (BALLOONS) ×1
BALLOON MUSTANG 6X100X135 (BALLOONS) IMPLANT
BALLOON MUSTANG 6X20X135 (BALLOONS) IMPLANT
CATH OMNI FLUSH 5F 65CM (CATHETERS) IMPLANT
CLOSURE MYNX CONTROL 6F/7F (Vascular Products) IMPLANT
GLIDEWIRE ADV .035X260CM (WIRE) IMPLANT
KIT ENCORE 26 ADVANTAGE (KITS) IMPLANT
KIT MICROPUNCTURE NIT STIFF (SHEATH) IMPLANT
KIT PV (KITS) ×1 IMPLANT
SHEATH FLEX ANSEL ST 6FR 45CM (SHEATH) IMPLANT
SHEATH PINNACLE 5F 10CM (SHEATH) IMPLANT
SHEATH PINNACLE 6F 10CM (SHEATH) IMPLANT
SHEATH PROBE COVER 6X72 (BAG) IMPLANT
STENT ELUVIA 6X100X130 (Permanent Stent) IMPLANT
SYR MEDRAD MARK V 150ML (SYRINGE) IMPLANT
TRANSDUCER W/STOPCOCK (MISCELLANEOUS) ×1 IMPLANT
TRAY PV CATH (CUSTOM PROCEDURE TRAY) ×1 IMPLANT
WIRE BENTSON .035X145CM (WIRE) IMPLANT

## 2022-05-06 NOTE — Progress Notes (Signed)
Patient arrived to short stay with level 2 hematoma. Vital signs stable. Stacy RN remain holding pressure. Dr paged and bed was requested. Patient was admitted to 4E for observation.Transferred to floor in stable condition.

## 2022-05-06 NOTE — Op Note (Signed)
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 Donzetta Matters, MD Procedure Performed: 1.  Ultrasound-guided cannulation left common femoral artery 2.  Aortogram with bilateral lower extremity runoff 3.  Stent right SFA with 6 x 100 mm Elluvia 4.  Moderate sedation with fentanyl and Versed for 34 minutes 5.  Mynx device closure left common femoral artery   Indications: 56 year old male with history of hepatocellular carcinoma now has right lower extremity rest pain ulceration of the medial right great toe with monophasic ABIs in the 0.6 range and no pulses palpable below his right common femoral.  Toe pressure was not obtained due to the existence of the wound.  He is now indicated for angiography of left common femoral approach with possibility intervention right lower extremity.  Findings: The aorta and iliac segments are free of flow-limiting stenosis with bilateral hypogastric arteries patent bilateral common femoral arteries without disease that is flow-limiting.  The right-sided site of interest with SFA that is subtotally occluded in the mid section down to the level of the abductor where heavily calcified.  After this he reconstitutes and has three-vessel runoff to the ankle posterior tibial giving rise to the arch in the foot and feeling distally.  After intervention the stenosis is reduced to less than 20% at the site of stenting with at least a 5 mm lumen.  The left side SFA does have some areas of calcium disease which are nonflow limiting and has two-vessel runoff to the foot via the anterior tibial and posterior tibial arteries.   Procedure:  The patient was identified in the holding area and taken to room 8.  The patient was then placed supine on the table and prepped and draped in the usual sterile  fashion.  A time out was called.  Ultrasound was used to evaluate the left common femoral artery which did have some calcium visible there was no evidence of flow-limiting stenosis in the common femoral artery.  The area was anesthetized with 1% lidocaine and cannulated with micropuncture [name] followed by wire and sheath.  And images saved the permanent record.  We placed a Bentson wire followed by 5 [place] sheath.  Concomitantly we administered moderate sedation with fentanyl and Versed and his vital signs were monitored throughout the case.  We placed an Omni catheter to the level of L1 and performed aortogram followed by bilateral lower extremity runoff.  With the above findings we elected intervention on the right side.  Glidewire advantage placed up and over the right rotation and long 6 French sheath was placed to the right common femoral artery and patient was fully heparinized.  We then used a quick cross catheter and advantage Glidewire to cross the subtotally occluded SFA confirmed intraluminal access.  We then primarily stented with drug-eluting stent that was 6 mm and postdilated with a 6 mm balloon that was similar length to the stent and then a 6 x 20 mm balloon as well which reduce stenosis to less than 20%.  Satisfied with this we exchanged for short 6 [place] sheath with a minx device.  He did initially have some bruising we administered 25 mg of protamine and this resolved with pressure.  The patient did tolerate the procedure without immediate complication and will be started on Plavix and a statin.  Contrast: 140cc  Messiyah Waterson C. Donzetta Matters, MD [facility] [place] Office: [phone] Pager: [phone]

## 2022-05-06 NOTE — H&P (Signed)
H+P   Danny [name] is a 56 y.o. male with history of hepatocellular carcinoma and stroke also has diabetes hypertension currently smoking 1/2 pack/day of cigarettes.  In December he underwent surgery to repair torn tendon in his right thigh that occurred on Halloween.  Since that time he has had slow to heal wound required reoperation in the right leg and now has a wound where the cast rubbed on the anterior shin and also developed ulceration of the right great toe medially.  He denies any fevers or chills.  He has been very weak undergoing chemoembolization of his cancer.  He has lost significant weight does have significant fatigue walks with the help of a walker.  Has never had any vascular invention in the past.  Occasionally takes aspirin no other blood thinners.       Past Medical History:  Diagnosis Date   Alcohol abuse     Anxiety     Back pain     CVA (cerebral infarction)     Depression     Diabetes mellitus     Hypertension     Migraines     Stroke [facility])           Family History  Problem Relation Age of Onset   Heart failure Mother     Stroke Father     Stroke Sister     Cancer Sister          breast cancer   Colon cancer Neg Hx     Liver cancer Neg Hx           Past Surgical History:  Procedure Laterality Date   CAST APPLICATION Right 8/36/6294    Procedure: CAST APPLICATION;  Surgeon: Carole Civil, MD;  Location: [facility];  Service: Orthopedics;  Laterality: Right;   FOOT SURGERY       INCISION AND DRAINAGE Right 11/02/2021    Procedure: INCISION AND DRAINAGE RIGHT KNEE;  Surgeon: Carole Civil, MD;  Location: [facility];  Service: Orthopedics;  Laterality: Right;   IR 3D INDEPENDENT WKST   10/11/2021   IR 3D INDEPENDENT WKST   02/28/2022   IR 3D INDEPENDENT WKST   03/14/2022   IR ANGIOGRAM SELECTIVE EACH ADDITIONAL VESSEL   10/11/2021   IR ANGIOGRAM SELECTIVE EACH ADDITIONAL VESSEL   10/11/2021   IR ANGIOGRAM SELECTIVE EACH ADDITIONAL VESSEL   10/11/2021   IR  ANGIOGRAM SELECTIVE EACH ADDITIONAL VESSEL   10/11/2021   IR ANGIOGRAM SELECTIVE EACH ADDITIONAL VESSEL   10/11/2021   IR ANGIOGRAM SELECTIVE EACH ADDITIONAL VESSEL   10/11/2021   IR ANGIOGRAM SELECTIVE EACH ADDITIONAL VESSEL   10/25/2021   IR ANGIOGRAM SELECTIVE EACH ADDITIONAL VESSEL   10/25/2021   IR ANGIOGRAM SELECTIVE EACH ADDITIONAL VESSEL   02/28/2022   IR ANGIOGRAM SELECTIVE EACH ADDITIONAL VESSEL   02/28/2022   IR ANGIOGRAM SELECTIVE EACH ADDITIONAL VESSEL   02/28/2022   IR ANGIOGRAM SELECTIVE EACH ADDITIONAL VESSEL   03/14/2022   IR ANGIOGRAM VISCERAL SELECTIVE   10/11/2021   IR ANGIOGRAM VISCERAL SELECTIVE   02/28/2022   IR ANGIOGRAM VISCERAL SELECTIVE   03/14/2022   IR EMBO TUMOR ORGAN ISCHEMIA INFARCT INC GUIDE ROADMAPPING   10/25/2021   IR EMBO TUMOR ORGAN ISCHEMIA INFARCT INC GUIDE ROADMAPPING   10/25/2021   IR EMBO TUMOR ORGAN ISCHEMIA INFARCT INC GUIDE ROADMAPPING   03/14/2022   IR RADIOLOGIST EVAL & MGMT   08/23/2021   IR RADIOLOGIST EVAL & MGMT   11/21/2021  IR RADIOLOGIST EVAL & MGMT   01/24/2022   IR US GUIDE VASC ACCESS RIGHT   10/11/2021   IR US GUIDE VASC ACCESS RIGHT   10/25/2021   IR US GUIDE VASC ACCESS RIGHT   02/28/2022   IR US GUIDE VASC ACCESS RIGHT   03/14/2022   MANDIBLE SURGERY       QUADRICEPS TENDON REPAIR Right 09/04/2021    Procedure: REPAIR QUADRICEP TENDON;  Surgeon: Carole Civil, MD;  Location: [facility];  Service: Orthopedics;  Laterality: Right;   TENDON EXPLORATION Right 11/02/2021    Procedure: QUADRICEPS TENDON AND PATELLAR TENDON EXPLORATION;  Surgeon: Carole Civil, MD;  Location: [facility];  Service: Orthopedics;  Laterality: Right;      Short Social History:  Social History         Tobacco Use   Smoking status: Every Day      Packs/day: 0.50      Years: 20.00      Total pack years: 10.00      Types: Cigarettes   Smokeless tobacco: Never  Substance Use Topics   Alcohol use: Not Currently      Comment: Quit 11 months ago. Used to drink about 6 pack a  day. (documented 06/06/21)          Allergies  Allergen Reactions   Ultram [Tramadol] Hives   Benadryl [Diphenhydramine] Itching and Rash            Current Outpatient Medications  Medication Sig Dispense Refill   albuterol (VENTOLIN HFA) 108 (90 Base) MCG/ACT inhaler Inhale 2 puffs into the lungs every 6 (six) hours as needed for wheezing or shortness of breath.       Cholecalciferol (VITAMIN D-3) 125 MCG (5000 UT) TABS Take 5,000 Units by mouth daily.       citalopram (CELEXA) 20 MG tablet Take 20 mg by mouth daily.       cyclobenzaprine (FLEXERIL) 5 MG tablet Take 5 mg by mouth daily as needed.       gabapentin (NEURONTIN) 600 MG tablet Take 600 mg by mouth in the morning and at bedtime.       meloxicam (MOBIC) 15 MG tablet Take 15 mg by mouth daily as needed.       meloxicam (MOBIC) 7.5 MG tablet Take 7.5 mg by mouth in the morning and at bedtime.       metFORMIN (GLUCOPHAGE) 500 MG tablet Take 1 tablet (500 mg total) by mouth 2 (two) times daily with a meal. 60 tablet 2   naloxone (NARCAN) nasal spray 4 mg/0.1 mL SMARTSIG:1 Both Nares Daily       omeprazole (PRILOSEC) 40 MG capsule Take 1 capsule (40 mg total) by mouth daily before breakfast. 30 capsule 3   oxyCODONE-acetaminophen (PERCOCET) 10-325 MG tablet Take 1 tablet by mouth every 4 (four) hours as needed for pain. 30 tablet 0   QUEtiapine (SEROQUEL) 100 MG tablet Take 100 mg by mouth at bedtime.        No current facility-administered medications for this visit.      Review of Systems  Constitutional: Positive for fatigue and unexpected weight change.  HENT: HENT negative.  Eyes: Eyes negative.  Respiratory: Respiratory negative.  Cardiovascular: Cardiovascular negative.  GI: Gastrointestinal negative.  Skin: Positive for wound.  Neurological: Neurological negative. Hematologic: Hematologic/lymphatic negative.  Psychiatric: Psychiatric negative.          Objective:    Vitals:   05/06/22 0953  BP: (!) 140/78  Pulse: 92  Resp: 18  Temp: 97.6 F (36.4 C)  SpO2: 95%      Physical Exam HENT:     Head: Normocephalic.     Nose: Nose normal.  Eyes:     Pupils: Pupils are equal, round, and reactive to light.  Cardiovascular:     Pulses:          Radial pulses are 2+ on the right side and 2+ on the left side.       Femoral pulses are 2+ on the right side and 2+ on the left side.      Popliteal pulses are 0 on the right side and 2+ on the left side.       Dorsalis pedis pulses are 0 on the right side and 2+ on the left side.       Posterior tibial pulses are 0 on the right side.  Pulmonary:     Effort: Pulmonary effort is normal.  Abdominal:     General: Abdomen is flat.     Palpations: Abdomen is soft. There is no mass.  Skin:    Capillary Refill: Capillary refill takes more than 3 seconds.     Comments: Dressing on right anterior leg wound there is a granulated wound in the right knee and medial right great toe has ulceration that is superficial  Neurological:     General: No focal deficit present.     Mental Status: He is alert.  Psychiatric:        Mood and Affect: Mood normal.        Data: ABI Findings:  +---------+------------------+-----+----------+--------------------------+  Right    Rt Pressure (mmHg)IndexWaveform  Comment                     +---------+------------------+-----+----------+--------------------------+  Brachial 89                                                           +---------+------------------+-----+----------+--------------------------+  PTA      55                0.62 monophasic                            +---------+------------------+-----+----------+--------------------------+  DP       54                0.61 monophasic                            +---------+------------------+-----+----------+--------------------------+  Great Toe                                 cannot obtain due to wound   +---------+------------------+-----+----------+--------------------------+   +---------+------------------+-----+---------+-------+  Left     Lt Pressure (mmHg)IndexWaveform Comment  +---------+------------------+-----+---------+-------+  Brachial 85                                       +---------+------------------+-----+---------+-------+  PTA      84  0.94 triphasic         +---------+------------------+-----+---------+-------+  DP       81                0.91 triphasic         +---------+------------------+-----+---------+-------+  Great Toe63                0.71                   +---------+------------------+-----+---------+-------+   +-------+-----------+--------------------------+------------+------------+  ABI/TBIToday's ABIToday's TBI               Previous ABIPrevious TBI  +-------+-----------+--------------------------+------------+------------+  Right  0.62       cannot obtain due to wound                          +-------+-----------+--------------------------+------------+------------+  Left   0.94       0.71                                                +-------+-----------+--------------------------+------------+------------+     Summary:  Right: Resting right ankle-brachial index indicates moderate right lower  extremity arterial disease.   Left: Resting left ankle-brachial index indicates mild left lower  extremity arterial disease. The left toe-brachial index is normal.         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  I discussed with him that he needs smoking cessation and to begin 81 mg of aspirin daily.  We will begin with angiography of the right lower extremity from the left common femoral approach and will need  to be aggressive to revascularize him given his underlying medical conditions.  He demonstrates good understanding in the presence of his niece who is his power of attorney.         Waynetta Sandy MD [facility]

## 2022-05-06 NOTE — Progress Notes (Signed)
Patient arrived to 4E from short stay. Vitals taken and stable. Tele placed and CCMD notified. Patient oriented to unit and staff. Groin site assessed and level 1. Some bruising and scrotum swollen. Patient educated on bedrest until 1945 and verbally agreed. Call bell within reach. [place] C Morgaine Kimball

## 2022-05-06 NOTE — Progress Notes (Signed)
Pt reports he fell this am-skin tear to left elbow-bandaid applied by Cardell Peach, RN

## 2022-05-06 NOTE — Progress Notes (Signed)
Danny [name], RCIS remains at bedside, holding manual pressure, Dr Donzetta Matters to bedside to evaluate pt, ok to transfer to short stay if no change in left groin within next 30 minutes, safety maintained, pt encourage not to move lle or move bottom too much

## 2022-05-07 ENCOUNTER — Encounter (HOSPITAL_COMMUNITY): Payer: Self-pay | Admitting: Vascular Surgery

## 2022-05-07 ENCOUNTER — Other Ambulatory Visit: Payer: Self-pay | Admitting: Orthopedic Surgery

## 2022-05-07 DIAGNOSIS — I70221 Atherosclerosis of native arteries of extremities with rest pain, right leg: Secondary | ICD-10-CM | POA: Diagnosis not present

## 2022-05-07 DIAGNOSIS — T8189XD Other complications of procedures, not elsewhere classified, subsequent encounter: Secondary | ICD-10-CM

## 2022-05-07 MED ORDER — PANTOPRAZOLE SODIUM 40 MG PO TBEC: 40.0000 mg | DELAYED_RELEASE_TABLET | Freq: Every day | ORAL | 2 refills | Status: DC

## 2022-05-07 MED ORDER — OXYCODONE-ACETAMINOPHEN 10-325 MG PO TABS: 1.0000 | ORAL_TABLET | ORAL | 0 refills | Status: DC | PRN

## 2022-05-07 MED ORDER — ASPIRIN 81 MG PO TBEC: 81.0000 mg | DELAYED_RELEASE_TABLET | Freq: Every day | ORAL | 2 refills | Status: AC

## 2022-05-07 NOTE — Progress Notes (Signed)
Meds ordered this encounter  Medications   oxyCODONE-acetaminophen (PERCOCET) 10-325 MG tablet    Sig: Take 1 tablet by mouth every 4 (four) hours as needed for pain.    Dispense:  30 tablet    Refill:  0

## 2022-05-07 NOTE — Discharge Summary (Signed)
Discharge Summary  Patient ID: Danny [name] [id number] 56 y.o. Dec 08, 1965  Admit date: 05/06/2022  Discharge date and time: 05/07/2022 12:08 PM   Admitting Physician: Waynetta Sandy, MD   Discharge Physician: same  Admission Diagnoses: S/P aortogram [Z98.890] PAD (peripheral artery disease) ([place]) [I73.9]  Discharge Diagnoses: same  Admission Condition: fair  Discharged Condition: fair  Indication for Admission: post op care  Hospital Course: Danny [name] is a 56 year old male who underwent right SFA stenting due to toe wound of the right foot.  This was performed by Dr. Donzetta Matters on 05/06/2022.  Postoperatively he was kept in the hospital due to concern for hematoma in the left groin.  POD #1 left groin was soft without hematoma.  He has a palpable right DP and PT pulse since SFA stenting.  He will be on aspirin, Plavix, statin daily.  He will follow-up in office in 4 to 6 weeks for right lower extremity arterial duplex and ABI.  He was discharged home in stable condition.  Consults: None  Treatments: surgery: Right lower extremity arteriogram with right SFA stent placement by Dr. Donzetta Matters on 05/06/2022  Discharge Exam: See progress note 8/29 Vitals:   05/07/22 0800 05/07/22 1000  BP: 123/66 128/70  Pulse: 89 72  Resp: 15 12  Temp: 97.6 F (36.4 C)   SpO2: 95% 93%     Disposition: Discharge disposition: 01-Home or Self Care       Patient Instructions:  Allergies as of 05/07/2022       Reactions   Ultram [tramadol] Hives   Benadryl [diphenhydramine] Itching, Rash        Medication List     STOP taking these medications    meloxicam 15 MG tablet Commonly known as: MOBIC   omeprazole 40 MG capsule Commonly known as: PRILOSEC       TAKE these medications    albuterol 108 (90 Base) MCG/ACT inhaler Commonly known as: VENTOLIN HFA Inhale 2 puffs into the lungs every 6 (six) hours as needed for wheezing or shortness of breath.   aspirin  EC 81 MG tablet Take 1 tablet (81 mg total) by mouth daily. Swallow whole.   cephALEXin 500 MG capsule Commonly known as: KEFLEX Take 1 capsule (500 mg total) by mouth 4 (four) times daily.   citalopram 20 MG tablet Commonly known as: CELEXA Take 20 mg by mouth daily.   clopidogrel 75 MG tablet Commonly known as: Plavix Take 1 tablet (75 mg total) by mouth daily.   cyclobenzaprine 5 MG tablet Commonly known as: FLEXERIL Take 5 mg by mouth 3 (three) times daily as needed for muscle spasms.   gabapentin 600 MG tablet Commonly known as: NEURONTIN Take 600 mg by mouth in the morning and at bedtime.   Melatonin 10 MG Caps Take 10 mg by mouth at bedtime.   metFORMIN 500 MG tablet Commonly known as: GLUCOPHAGE Take 1 tablet (500 mg total) by mouth 2 (two) times daily with a meal.   naloxone 4 MG/0.1ML Liqd nasal spray kit Commonly known as: NARCAN Place 1 spray into the nose once.   oxyCODONE-acetaminophen 10-325 MG tablet Commonly known as: Percocet Take 1 tablet by mouth every 4 (four) hours as needed for pain.   pantoprazole 40 MG tablet Commonly known as: Protonix Take 1 tablet (40 mg total) by mouth daily.   QUEtiapine 100 MG tablet Commonly known as: SEROQUEL Take 100 mg by mouth at bedtime.   rosuvastatin 10 MG tablet Commonly known as:  Crestor Take 1 tablet (10 mg total) by mouth daily.   sulfamethoxazole-trimethoprim 800-160 MG tablet Commonly known as: BACTRIM DS Take 1 tablet by mouth 2 (two) times daily.   Vitamin D-3 125 MCG (5000 UT) Tabs Take 5,000 Units by mouth daily.       Activity: activity as tolerated Diet: regular diet Wound Care: keep wound clean and dry  Follow-up with [facility] in 5 weeks.  Signed: Dagoberto Ligas, PA-C 05/07/2022 4:10 PM [facility] Office: [phone]

## 2022-05-07 NOTE — Discharge Instructions (Addendum)
[facility]  Discharge Instructions  Lower Extremity Angiogram; Angioplasty/Stenting  Please refer to the following instructions for your post-procedure care. Your surgeon or physician assistant will discuss any changes with you.  Activity  Avoid lifting more than 8 pounds (1 gallons of milk) for 72 hours (3 days) after your procedure. You may walk as much as you can tolerate. It's OK to drive after 72 hours.  Bathing/Showering  You may shower the day after your procedure. If you have a bandage, you may remove it at 24- 48 hours. Clean your incision site with mild soap and water. Pat the area dry with a clean towel.  Diet  Resume your pre-procedure diet. There are no special food restrictions following this procedure. All patients with peripheral vascular disease should follow a low fat/low cholesterol diet. In order to heal from your surgery, it is CRITICAL to get adequate nutrition. Your body requires vitamins, minerals, and protein. Vegetables are the best source of vitamins and minerals. Vegetables also provide the perfect balance of protein. Processed food has little nutritional value, so try to avoid this.  Medications  Resume taking all of your medications unless your doctor tells you not to. If your incision is causing pain, you may take over-the-counter pain relievers such as acetaminophen (Tylenol)  Follow Up  Follow up will be arranged at the time of your procedure. You may have an office visit scheduled or may be scheduled for surgery. Ask your surgeon if you have any questions.  Please call us immediately for any of the following conditions: Severe or worsening pain your legs or feet at rest or with walking. Increased pain, redness, drainage at your groin puncture site. Fever of 101 degrees or higher. If you have any mild or slow bleeding from your puncture site: lie down, apply firm constant pressure over the area with a piece of gauze  or a clean wash cloth for 30 minutes- no peeking!, call 911 right away if you are still bleeding after 30 minutes, or if the bleeding is heavy and unmanageable.  Reduce your risk factors of vascular disease:  Stop smoking. If you would like help call [facility] at 1-800-QUIT-NOW (581)738-5885) or [place] at 6718347007. Manage your cholesterol Maintain a desired weight Control your diabetes Keep your blood pressure down  If you have any questions, please call the office at [phone]      Information about your medication: Plavix (anti-platelet agent)  Generic Name (Brand): clopidogrel (Plavix), once daily medication  PURPOSE: You are taking this medication along with aspirin to lower your chance of having a heart attack, stroke, or blood clots in your stent. These can be fatal. Plavix and aspirin help prevent platelets from sticking together and forming a clot that can block an artery or your stent.   Common SIDE EFFECTS you may experience include: bruising or bleeding more easily, shortness of breath  Do not stop taking PLAVIX without talking to the doctor who prescribes it for you. People who are treated with a stent and stop taking Plavix too soon, have a higher risk of getting a blood clot in the stent, having a heart attack, or dying. If you stop Plavix because of bleeding, or for other reasons, your risk of a heart attack or stroke may increase.   Avoid taking NSAID agents or anti-inflammatory medications such as ibuprofen, naproxen given increased bleed risk with plavix - can use acetaminophen (Tylenol) if needed for pain.  Avoid taking over the  counter stomach medications omeprazole (Prilosec) or esomeprazole (Nexium) since these do interact and make plavix less effective - ask your pharmacist or doctor for alterative agents if needed for heartburn or GERD.   Tell all of your doctors and dentists that you are taking Plavix. They should talk to the doctor who prescribed  Plavix for you before you have any surgery or invasive procedure.   Contact your health care provider if you experience: severe or uncontrollable bleeding, pink/red/brown urine, vomiting blood or vomit that looks like "coffee grounds", red or black stools (looks like tar), coughing up blood or blood clots ----------------------------------------------------------------------------------------------------------------------

## 2022-05-07 NOTE — Progress Notes (Signed)
  Progress Note    05/07/2022 8:09 AM 1 Day Post-Op  Subjective:  wants to go home   Vitals:   05/07/22 0400 05/07/22 0800  BP: 117/65 123/66  Pulse: 66 89  Resp: 16 15  Temp: 98.2 F (36.8 C) 97.6 F (36.4 C)  SpO2: 94% 95%   Physical Exam: Lungs:  non labored Incisions:  L groin soft without palpable firm hematoma Extremities:  palpable R DP and PT by doppler; L foot warm with absent pedal pulses Abdomen:  soft, NT, ND Neurologic: A&O  CBC    Component Value Date/Time   WBC 4.7 04/20/2022 1103   RBC 3.95 (L) 04/20/2022 1103   HGB 15.6 05/06/2022 1002   HCT 46.0 05/06/2022 1002   PLT 108 (L) 04/20/2022 1103   MCV 92.9 04/20/2022 1103   MCH 31.6 04/20/2022 1103   MCHC 34.1 04/20/2022 1103   RDW 13.2 04/20/2022 1103   LYMPHSABS 0.4 (L) 04/20/2022 1103   MONOABS 0.4 04/20/2022 1103   EOSABS 0.2 04/20/2022 1103   BASOSABS 0.0 04/20/2022 1103    BMET    Component Value Date/Time   NA 140 05/06/2022 1002   K 3.9 05/06/2022 1002   CL 106 05/06/2022 1002   CO2 26 04/20/2022 1103   GLUCOSE 144 (H) 05/06/2022 1002   BUN 10 05/06/2022 1002   CREATININE 0.60 (L) 05/06/2022 1002   CREATININE 0.83 06/11/2021 0907   CALCIUM 9.2 04/20/2022 1103   GFRNONAA >60 04/20/2022 1103   GFRAA >60 05/24/2019 1858    INR    Component Value Date/Time   INR 1.1 02/28/2022 0822     Intake/Output Summary (Last 24 hours) at 05/07/2022 0809 Last data filed at 05/07/2022 0804 Gross per 24 hour  Intake 744.49 ml  Output 300 ml  Net 444.49 ml     Assessment/Plan:  56 y.o. male is s/p R SFA stent 1 Day Post-Op   R foot now well perfused with palpable DP and PT pulse L groin without firm hematoma Ok for discharge home; prescription written for plavix and statin, continue aspirin; office will arrange follow up duplex in 4-6 weeks   Dagoberto Ligas, PA-C [facility] [phone] 05/07/2022 8:09 AM

## 2022-05-07 NOTE — Progress Notes (Signed)
Patient discharging home. IV removed without complications. Tele removed and CCMD notified. Discharge instructions given and medication administration discussed. All questions answered. Patient had concern regarding pain medication. RN paged PA, Matt and stated that "vascular doesn't prescribe home pain medication after an angiogram. Patient will have to contact primary". RN relayed this information to patient. Waiting for patient's ride to get to hospital.  Danny [name]

## 2022-05-13 ENCOUNTER — Other Ambulatory Visit: Payer: Self-pay | Admitting: Orthopedic Surgery

## 2022-05-13 DIAGNOSIS — T8189XD Other complications of procedures, not elsewhere classified, subsequent encounter: Secondary | ICD-10-CM

## 2022-05-14 ENCOUNTER — Other Ambulatory Visit: Payer: Self-pay | Admitting: Orthopedic Surgery

## 2022-05-14 DIAGNOSIS — T8189XD Other complications of procedures, not elsewhere classified, subsequent encounter: Secondary | ICD-10-CM

## 2022-05-14 MED ORDER — OXYCODONE-ACETAMINOPHEN 10-325 MG PO TABS: 1.0000 | ORAL_TABLET | ORAL | 0 refills | Status: DC | PRN

## 2022-05-20 ENCOUNTER — Other Ambulatory Visit: Payer: Self-pay | Admitting: Orthopedic Surgery

## 2022-05-20 ENCOUNTER — Other Ambulatory Visit: Payer: Self-pay | Admitting: Interventional Radiology

## 2022-05-20 DIAGNOSIS — T8189XD Other complications of procedures, not elsewhere classified, subsequent encounter: Secondary | ICD-10-CM

## 2022-05-20 DIAGNOSIS — C220 Liver cell carcinoma: Secondary | ICD-10-CM

## 2022-05-20 DIAGNOSIS — G250 Essential tremor: Secondary | ICD-10-CM | POA: Insufficient documentation

## 2022-05-20 MED ORDER — OXYCODONE-ACETAMINOPHEN 10-325 MG PO TABS: 1.0000 | ORAL_TABLET | ORAL | 0 refills | Status: DC | PRN

## 2022-05-23 ENCOUNTER — Ambulatory Visit (HOSPITAL_COMMUNITY): Payer: Medicaid Other | Attending: Emergency Medicine | Admitting: Physical Therapy

## 2022-05-23 ENCOUNTER — Encounter (HOSPITAL_COMMUNITY): Payer: Self-pay | Admitting: Physical Therapy

## 2022-05-23 DIAGNOSIS — S91101D Unspecified open wound of right great toe without damage to nail, subsequent encounter: Secondary | ICD-10-CM | POA: Diagnosis present

## 2022-05-23 DIAGNOSIS — R2689 Other abnormalities of gait and mobility: Secondary | ICD-10-CM | POA: Insufficient documentation

## 2022-05-23 NOTE — Therapy (Signed)
[place], [place], [address] Phone: [phone]   Fax:  [phone]  Wound Care Evaluation  Patient Details  Name: Danny [name] MRN: [id number] Date of Birth: 02-07-1966 Referring Provider (PT): Nanda Quinton   Encounter Date: 05/23/2022   PT End of Session - 05/23/22 1553     Visit Number 1    Number of Visits 3    Date for PT Re-Evaluation 06/28/22    Authorization Type Medicaid - [employer]    Authorization Time Period submitted for two visits    Progress Note Due on Visit 3             Past Medical History:  Diagnosis Date   Alcohol abuse    Anxiety    Back pain    CVA (cerebral infarction)    Depression    Diabetes mellitus    Hypertension    Migraines    Stroke Danny [name])     Past Surgical History:  Procedure Laterality Date   ABDOMINAL AORTOGRAM W/LOWER EXTREMITY Right 05/06/2022   Procedure: ABDOMINAL AORTOGRAM W/LOWER EXTREMITY;  Surgeon: Waynetta Sandy, MD;  Location: [facility];  Service: Cardiovascular;  Laterality: Right;   CAST APPLICATION Right 2/59/5638   Procedure: CAST APPLICATION;  Surgeon: Carole Civil, MD;  Location: [facility];  Service: Orthopedics;  Laterality: Right;   FOOT SURGERY     INCISION AND DRAINAGE Right 11/02/2021   Procedure: INCISION AND DRAINAGE RIGHT KNEE;  Surgeon: Carole Civil, MD;  Location: [facility];  Service: Orthopedics;  Laterality: Right;   IR 3D INDEPENDENT WKST  10/11/2021   IR 3D INDEPENDENT WKST  02/28/2022   IR 3D INDEPENDENT WKST  03/14/2022   IR ANGIOGRAM SELECTIVE EACH ADDITIONAL VESSEL  10/11/2021   IR ANGIOGRAM SELECTIVE EACH ADDITIONAL VESSEL  10/11/2021   IR ANGIOGRAM SELECTIVE EACH ADDITIONAL VESSEL  10/11/2021   IR ANGIOGRAM SELECTIVE EACH ADDITIONAL VESSEL  10/11/2021   IR ANGIOGRAM SELECTIVE EACH ADDITIONAL VESSEL  10/11/2021   IR ANGIOGRAM SELECTIVE EACH ADDITIONAL VESSEL  10/11/2021   IR ANGIOGRAM SELECTIVE EACH ADDITIONAL  VESSEL  10/25/2021   IR ANGIOGRAM SELECTIVE EACH ADDITIONAL VESSEL  10/25/2021   IR ANGIOGRAM SELECTIVE EACH ADDITIONAL VESSEL  02/28/2022   IR ANGIOGRAM SELECTIVE EACH ADDITIONAL VESSEL  02/28/2022   IR ANGIOGRAM SELECTIVE EACH ADDITIONAL VESSEL  02/28/2022   IR ANGIOGRAM SELECTIVE EACH ADDITIONAL VESSEL  03/14/2022   IR ANGIOGRAM VISCERAL SELECTIVE  10/11/2021   IR ANGIOGRAM VISCERAL SELECTIVE  02/28/2022   IR ANGIOGRAM VISCERAL SELECTIVE  03/14/2022   IR EMBO TUMOR ORGAN ISCHEMIA INFARCT INC GUIDE ROADMAPPING  10/25/2021   IR EMBO TUMOR ORGAN ISCHEMIA INFARCT INC GUIDE ROADMAPPING  10/25/2021   IR EMBO TUMOR ORGAN ISCHEMIA INFARCT INC GUIDE ROADMAPPING  03/14/2022   IR RADIOLOGIST EVAL & MGMT  08/23/2021   IR RADIOLOGIST EVAL & MGMT  11/21/2021   IR RADIOLOGIST EVAL & MGMT  01/24/2022   IR RADIOLOGIST EVAL & MGMT  04/09/2022   IR US GUIDE VASC ACCESS RIGHT  10/11/2021   IR US GUIDE VASC ACCESS RIGHT  10/25/2021   IR US GUIDE VASC ACCESS RIGHT  02/28/2022   IR US GUIDE VASC ACCESS RIGHT  03/14/2022   MANDIBLE SURGERY     PERIPHERAL VASCULAR INTERVENTION Right 05/06/2022   Procedure: PERIPHERAL VASCULAR INTERVENTION;  Surgeon: Waynetta Sandy, MD;  Location: [facility];  Service: Cardiovascular;  Laterality: Right;  SFA   QUADRICEPS  TENDON REPAIR Right 09/04/2021   Procedure: REPAIR QUADRICEP TENDON;  Surgeon: Carole Civil, MD;  Location: [facility];  Service: Orthopedics;  Laterality: Right;   TENDON EXPLORATION Right 11/02/2021   Procedure: QUADRICEPS TENDON AND PATELLAR TENDON EXPLORATION;  Surgeon: Carole Civil, MD;  Location: [facility];  Service: Orthopedics;  Laterality: Right;    There were no vitals filed for this visit.     [facility] PT Assessment - 05/23/22 0001       Assessment   Medical Diagnosis Rt great toe wound    Referring Provider (PT) Nanda Quinton    Prior Therapy Treated prior to stenting      Precautions   Precautions --   cellulitis     Restrictions   Weight  Bearing Restrictions No      Balance Screen   Has the patient fallen in the past 6 months No             Wound Therapy - 05/23/22 0001     Subjective Pt states that he ended up having a stent as his artery was almost 100% blocked.  The wound on his shin has healed but he continues to have a wound on the inside of his toe which the MD wants to be watched.    Pain Scale 0-10    Pain Score 0-No pain    Evaluation and Treatment Procedures Explained to Patient/Family --   yes   Wound Properties Date First Assessed: 12/04/21 Time First Assessed: 1010 Location: Pretibial Location Orientation: Proximal;Right Present on Admission: -- , unsure  Final Assessment Date: 05/23/22 Final Assessment Time: 1540   Wound Properties Date First Assessed: 12/04/21 Location: Toe (Comment  which one) Location Orientation: Anterior;Right Wound Description (Comments): Great Toe Present on Admission: No Final Assessment Date: 05/23/22 Final Assessment Time: 1540   Wound Properties Date First Assessed: 03/07/22 Time First Assessed: 0840 Wound Type: Other (Comment) Location: Toe (Comment  which one) Location Orientation: Right;Medial Wound Description (Comments): medial Rt great toe Present on Admission: No   Dressing Type None    Dressing Changed New    Dressing Status None    Dressing Change Frequency PRN    Site / Wound Assessment Dry;Pale    % Wound base Other/Granulation Tissue (Comment) --   pale not granulating tissue surrounded by dry skin   Peri-wound Assessment Maceration    Wound Length (cm) 1.2 cm    Wound Width (cm) 0.6 cm    Wound Surface Area (cm^2) 0.72 cm^2    Treatment Cleansed;Debridement (Selective)    Selective Debridement (non-excisional) - Location medial toe wound    Selective Debridement (non-excisional) - Tools Used Forceps    Wound Therapy - Clinical Statement Danny [name] is a 56 yo male who has had multiple wounds in his LE.  He has been seen at this clinic for 25 treatments for these  wounds and ultimately ended up having a stent placed in his Rt LE.  All wounds except for his Rt medial great toe have healed and he is now being referred for wound care for this wound.  Evaluation demonstrates a clean wound.  His niece had been taking care of the other wounds.  Therapist recommended how the niece should care for this wound and gave the pt supplies.  We will check on the wound bi weekly but at this time weekly skilled care is not needed.    Wound Therapy - Functional Problem List difficulty in walking and bathing  Factors Delaying/Impairing Wound Healing Altered sensation;Multiple medical problems    Hydrotherapy Plan Debridement;Dressing change;Patient/family education    Wound Therapy - Frequency Other (comment)    Wound Therapy - Current Recommendations --   biweekly   Wound Plan measure every two weeks continue to monitor and advise treatment to get wound to heal    Dressing  honey alginate, 2x2, secured with kling and netting.                   Objective measurements completed on examination: See above findings.               PT Short Term Goals - 05/23/22 1554       PT SHORT TERM GOAL #1   Title Pt wound to be 100% granulated    Time 2    Period Weeks    Status New    Target Date 06/06/22      PT SHORT TERM GOAL #2   Title Pt wound size to be decreased to .8x.4    Time 2    Period Weeks    Status New               PT Long Term Goals - 05/23/22 1555       PT LONG TERM GOAL #1   Title Pt wound to be healed to reduce risk of infection and assist in normalization of gait    Time 4    Period Weeks    Status New    Target Date 06/28/22                  Plan - 05/23/22 1556     Clinical Impression Statement see above    Personal Factors and Comorbidities Comorbidity 3+;Fitness;Past/Current Experience;Time since onset of injury/illness/exacerbation    Comorbidities liver ca, DM, Smokes, hx CVA, HTN     Examination-Activity Limitations Bathing;Locomotion Level;Transfers;Lift;Stand;Stairs;Squat    Examination-Participation Restrictions Meal Prep;Cleaning;Shop;Volunteer;Laundry;Yard Work    [employer] Evolving/Moderate complexity    Rehab Potential Good    PT Frequency 2x / week    PT Duration 8 weeks    PT Treatment/Interventions ADLs/Self Care Home Management;Patient/family education;Energy conservation;Splinting;Taping;Other (comment)   wound care   PT Next Visit Plan debride and dressing changes    Consulted and Agree with Plan of Care Patient             Patient will benefit from skilled therapeutic intervention in order to improve the following deficits and impairments:  Abnormal gait, Difficulty walking, Decreased skin integrity, Decreased activity tolerance, Pain, Impaired flexibility, Decreased mobility  Visit Diagnosis: Other abnormalities of gait and mobility  Open wound of right great toe, subsequent encounter    Problem List Patient Active Problem List   Diagnosis Date Noted   S/P aortogram 05/06/2022   PAD (peripheral artery disease) ([place]) 05/06/2022   Wound infection after surgery 09/27/2021   Postoperative wound cellulitis 09/26/2021   Rupture of right quadriceps tendon    Chronic hepatitis C without hepatic coma ([place]) 08/08/2021   Hepatocellular carcinoma ([place]) 08/08/2021   COPD (chronic obstructive pulmonary disease) ([place]) 08/08/2021   Hypertension 08/08/2021   Other cirrhosis of liver ([place]) 08/08/2021   Type 2 diabetes mellitus ([place]) 08/08/2021   Generalized abdominal pain 06/06/2021   Dysphagia 06/06/2021   Gastroesophageal reflux disease 06/06/2021   Abnormal liver ultrasound 06/06/2021   Colon cancer screening 06/06/2021   Elevated LFTs 06/06/2021   GREATER TROCHANTERIC BURSITIS 07/03/2007   INCREASED BLOOD  PRESSURE 07/03/2007   BACK PAIN, LUMBAR 05/29/2007   NUMBNESS 05/29/2007   INSOMNIA, CHRONIC 05/13/2007    HYPERLIPIDEMIA 01/13/2007   ANXIETY DEPRESSION 01/13/2007   SYNDROME, CHRONIC PAIN 01/13/2007   CVA 01/13/2007   ARTHRITIS 01/13/2007   Rayetta Humphrey, PT CLT [phone]  05/23/2022, 4:16 PM  [facility] [address] [place], [place], [address] Phone: [phone]   Fax:  [phone]  Name: TEX CONROY MRN: [id number] Date of Birth: 03-Apr-1966

## 2022-05-25 ENCOUNTER — Other Ambulatory Visit: Payer: Self-pay | Admitting: Orthopedic Surgery

## 2022-05-25 DIAGNOSIS — T8189XD Other complications of procedures, not elsewhere classified, subsequent encounter: Secondary | ICD-10-CM

## 2022-05-26 ENCOUNTER — Other Ambulatory Visit: Payer: Self-pay | Admitting: Orthopedic Surgery

## 2022-05-26 DIAGNOSIS — T8189XD Other complications of procedures, not elsewhere classified, subsequent encounter: Secondary | ICD-10-CM

## 2022-05-27 ENCOUNTER — Ambulatory Visit (HOSPITAL_COMMUNITY): Payer: Medicaid Other | Admitting: Physical Therapy

## 2022-05-27 MED ORDER — OXYCODONE-ACETAMINOPHEN 10-325 MG PO TABS: 1.0000 | ORAL_TABLET | ORAL | 0 refills | Status: DC | PRN

## 2022-05-29 ENCOUNTER — Ambulatory Visit (HOSPITAL_COMMUNITY): Payer: Medicaid Other | Admitting: Physical Therapy

## 2022-06-03 ENCOUNTER — Other Ambulatory Visit: Payer: Self-pay | Admitting: Orthopedic Surgery

## 2022-06-03 ENCOUNTER — Other Ambulatory Visit: Payer: Self-pay | Admitting: *Deleted

## 2022-06-03 ENCOUNTER — Ambulatory Visit (HOSPITAL_COMMUNITY): Payer: Medicaid Other | Admitting: Physical Therapy

## 2022-06-03 DIAGNOSIS — I739 Peripheral vascular disease, unspecified: Secondary | ICD-10-CM

## 2022-06-03 DIAGNOSIS — I70235 Atherosclerosis of native arteries of right leg with ulceration of other part of foot: Secondary | ICD-10-CM

## 2022-06-03 DIAGNOSIS — T8189XD Other complications of procedures, not elsewhere classified, subsequent encounter: Secondary | ICD-10-CM

## 2022-06-03 MED ORDER — OXYCODONE-ACETAMINOPHEN 10-325 MG PO TABS: 1.0000 | ORAL_TABLET | ORAL | 0 refills | Status: DC | PRN

## 2022-06-04 ENCOUNTER — Ambulatory Visit (HOSPITAL_COMMUNITY): Payer: Medicaid Other | Admitting: Physical Therapy

## 2022-06-04 ENCOUNTER — Encounter (HOSPITAL_COMMUNITY): Payer: Self-pay | Admitting: Physical Therapy

## 2022-06-04 DIAGNOSIS — S91101D Unspecified open wound of right great toe without damage to nail, subsequent encounter: Secondary | ICD-10-CM

## 2022-06-04 DIAGNOSIS — R2689 Other abnormalities of gait and mobility: Secondary | ICD-10-CM | POA: Diagnosis not present

## 2022-06-04 NOTE — Therapy (Signed)
[place], [place], [address] Phone: [phone]   Fax:  [phone]  Wound Care Therapy  Patient Details  Name: Danny [name] MRN: [id number] Date of Birth: [birth year] Referring Provider (PT): Nanda Quinton   Encounter Date: 06/04/2022   PT End of Session - 06/04/22 1437     Visit Number 2    Number of Visits 3    Date for PT Re-Evaluation 06/28/22    Authorization Type Medicaid - [place] Time Period 2 visits 9/15-10/14    Authorization - Visit Number 1    Authorization - Number of Visits 2    Progress Note Due on Visit 3    PT Start Time 1437    PT Stop Time 1504    PT Time Calculation (min) 27 min    Activity Tolerance Patient tolerated treatment well    Behavior During Therapy [facility] for tasks assessed/performed             Past Medical History:  Diagnosis Date   Alcohol abuse    Anxiety    Back pain    CVA (cerebral infarction)    Depression    Diabetes mellitus    Hypertension    Migraines    Stroke [facility])     Past Surgical History:  Procedure Laterality Date   ABDOMINAL AORTOGRAM W/LOWER EXTREMITY Right 05/06/2022   Procedure: ABDOMINAL AORTOGRAM W/LOWER EXTREMITY;  Surgeon: Waynetta Sandy, MD;  Location: [facility];  Service: Cardiovascular;  Laterality: Right;   CAST APPLICATION Right 0/62/3762   Procedure: CAST APPLICATION;  Surgeon: Carole Civil, MD;  Location: [facility];  Service: Orthopedics;  Laterality: Right;   FOOT SURGERY     INCISION AND DRAINAGE Right 11/02/2021   Procedure: INCISION AND DRAINAGE RIGHT KNEE;  Surgeon: Carole Civil, MD;  Location: [facility];  Service: Orthopedics;  Laterality: Right;   IR 3D INDEPENDENT WKST  10/11/2021   IR 3D INDEPENDENT WKST  02/28/2022   IR 3D INDEPENDENT WKST  03/14/2022   IR ANGIOGRAM SELECTIVE EACH ADDITIONAL VESSEL  10/11/2021   IR ANGIOGRAM SELECTIVE EACH ADDITIONAL VESSEL  10/11/2021   IR  ANGIOGRAM SELECTIVE EACH ADDITIONAL VESSEL  10/11/2021   IR ANGIOGRAM SELECTIVE EACH ADDITIONAL VESSEL  10/11/2021   IR ANGIOGRAM SELECTIVE EACH ADDITIONAL VESSEL  10/11/2021   IR ANGIOGRAM SELECTIVE EACH ADDITIONAL VESSEL  10/11/2021   IR ANGIOGRAM SELECTIVE EACH ADDITIONAL VESSEL  10/25/2021   IR ANGIOGRAM SELECTIVE EACH ADDITIONAL VESSEL  10/25/2021   IR ANGIOGRAM SELECTIVE EACH ADDITIONAL VESSEL  02/28/2022   IR ANGIOGRAM SELECTIVE EACH ADDITIONAL VESSEL  02/28/2022   IR ANGIOGRAM SELECTIVE EACH ADDITIONAL VESSEL  02/28/2022   IR ANGIOGRAM SELECTIVE EACH ADDITIONAL VESSEL  03/14/2022   IR ANGIOGRAM VISCERAL SELECTIVE  10/11/2021   IR ANGIOGRAM VISCERAL SELECTIVE  02/28/2022   IR ANGIOGRAM VISCERAL SELECTIVE  03/14/2022   IR EMBO TUMOR ORGAN ISCHEMIA INFARCT INC GUIDE ROADMAPPING  10/25/2021   IR EMBO TUMOR ORGAN ISCHEMIA INFARCT INC GUIDE ROADMAPPING  10/25/2021   IR EMBO TUMOR ORGAN ISCHEMIA INFARCT INC GUIDE ROADMAPPING  03/14/2022   IR RADIOLOGIST EVAL & MGMT  08/23/2021   IR RADIOLOGIST EVAL & MGMT  11/21/2021   IR RADIOLOGIST EVAL & MGMT  01/24/2022   IR RADIOLOGIST EVAL & MGMT  04/09/2022   IR US GUIDE VASC ACCESS RIGHT  10/11/2021   IR US GUIDE [place] RIGHT  10/25/2021   IR US  GUIDE VASC ACCESS RIGHT  02/28/2022   IR US GUIDE VASC ACCESS RIGHT  03/14/2022   MANDIBLE SURGERY     PERIPHERAL VASCULAR INTERVENTION Right 05/06/2022   Procedure: PERIPHERAL VASCULAR INTERVENTION;  Surgeon: Waynetta Sandy, MD;  Location: [facility];  Service: Cardiovascular;  Laterality: Right;  SFA   QUADRICEPS TENDON REPAIR Right 09/04/2021   Procedure: REPAIR QUADRICEP TENDON;  Surgeon: Carole Civil, MD;  Location: [facility];  Service: Orthopedics;  Laterality: Right;   TENDON EXPLORATION Right 11/02/2021   Procedure: QUADRICEPS TENDON AND PATELLAR TENDON EXPLORATION;  Surgeon: Carole Civil, MD;  Location: [facility];  Service: Orthopedics;  Laterality: Right;    There were no vitals filed for this  visit.      [facility] PT Assessment - 06/04/22 0001       Assessment   Medical Diagnosis Rt great toe wound    Referring Provider (PT) Nanda Quinton                     Wound Therapy - 06/04/22 0001     Subjective Pt states they have been changing dressings on toe    Evaluation and Treatment Procedures Explained to Patient/Family Yes   yes   Evaluation and Treatment Procedures agreed to    Wound Properties Date First Assessed: 03/07/22 Time First Assessed: 0840 Wound Type: Other (Comment) Location: Toe (Comment  which one) Location Orientation: Right;Medial Wound Description (Comments): medial Rt great toe Present on Admission: No   Dressing Type None    Dressing Changed Changed    Dressing Status Old drainage    Dressing Change Frequency PRN    Site / Wound Assessment Dry;Pale    % Wound base Red or Granulating 100%    % Wound base Yellow/Fibrinous Exudate 0%    Peri-wound Assessment Maceration    Wound Length (cm) 0.7 cm    Wound Width (cm) 0.6 cm    Wound Surface Area (cm^2) 0.42 cm^2    Drainage Amount Minimal    Drainage Description Serous    Treatment Cleansed;Debridement (Selective)    Selective Debridement (non-excisional) - Location medial toe wound    Selective Debridement (non-excisional) - Tools Used Forceps    Selective Debridement (non-excisional) - Tissue Removed slough    Wound Therapy - Clinical Statement Patient with continued wound between great and second toe. Cleansed wound and debrided slough from wound bed. removed mascerated peeling skin from margins. Granulation present following debridement. Wound with decrease in size compared to last session. Continued with same dressing as wound appears to be healing well.    Wound Therapy - Functional Problem List difficulty in walking and bathing    Factors Delaying/Impairing Wound Healing Altered sensation;Multiple medical problems    Hydrotherapy Plan Debridement;Dressing change;Patient/family education     Wound Therapy - Frequency Other (comment)    Wound Therapy - Current Recommendations --   biweekly   Wound Plan measure every two weeks continue to monitor and advise treatment to get wound to heal    Dressing  honey alginate, 2x2, secured with kling and netting.                       PT Short Term Goals - 05/23/22 1554       PT SHORT TERM GOAL #1   Title Pt wound to be 100% granulated    Time 2    Period Weeks    Status New  Target Date 06/06/22      PT SHORT TERM GOAL #2   Title Pt wound size to be decreased to .8x.4    Time 2    Period Weeks    Status New               PT Long Term Goals - 05/23/22 1555       PT LONG TERM GOAL #1   Title Pt wound to be healed to reduce risk of infection and assist in normalization of gait    Time 4    Period Weeks    Status New    Target Date 06/28/22                    Patient will benefit from skilled therapeutic intervention in order to improve the following deficits and impairments:     Visit Diagnosis: Other abnormalities of gait and mobility  Open wound of right great toe, subsequent encounter     Problem List Patient Active Problem List   Diagnosis Date Noted   S/P aortogram 05/06/2022   PAD (peripheral artery disease) ([place]) 05/06/2022   Wound infection after surgery 09/27/2021   Postoperative wound cellulitis 09/26/2021   Rupture of right quadriceps tendon    Chronic hepatitis C without hepatic coma ([place]) 08/08/2021   Hepatocellular carcinoma ([place]) 08/08/2021   COPD (chronic obstructive pulmonary disease) ([place]) 08/08/2021   Hypertension 08/08/2021   Other cirrhosis of liver ([place]) 08/08/2021   Type 2 diabetes mellitus ([place]) 08/08/2021   Generalized abdominal pain 06/06/2021   Dysphagia 06/06/2021   Gastroesophageal reflux disease 06/06/2021   Abnormal liver ultrasound 06/06/2021   Colon cancer screening 06/06/2021   Elevated LFTs 06/06/2021   GREATER TROCHANTERIC BURSITIS  07/03/2007   INCREASED BLOOD PRESSURE 07/03/2007   BACK PAIN, LUMBAR 05/29/2007   NUMBNESS 05/29/2007   INSOMNIA, CHRONIC 05/13/2007   HYPERLIPIDEMIA 01/13/2007   ANXIETY DEPRESSION 01/13/2007   SYNDROME, CHRONIC PAIN 01/13/2007   CVA 01/13/2007   ARTHRITIS 01/13/2007    Mearl Latin, PT 06/04/2022, 3:12 PM  [facility] [address] [place], [place], [address] Phone: [phone]   Fax:  [phone]  Name: Danny [name] MRN: [id number] Date of Birth: 06-17-1966

## 2022-06-05 ENCOUNTER — Ambulatory Visit (HOSPITAL_COMMUNITY): Payer: Medicaid Other | Admitting: Physical Therapy

## 2022-06-06 ENCOUNTER — Other Ambulatory Visit: Payer: Self-pay | Admitting: Orthopedic Surgery

## 2022-06-06 DIAGNOSIS — T8189XD Other complications of procedures, not elsewhere classified, subsequent encounter: Secondary | ICD-10-CM

## 2022-06-06 MED ORDER — OXYCODONE-ACETAMINOPHEN 10-325 MG PO TABS: 1.0000 | ORAL_TABLET | ORAL | 0 refills | Status: DC | PRN

## 2022-06-10 ENCOUNTER — Ambulatory Visit (HOSPITAL_COMMUNITY): Payer: Medicaid Other | Attending: Emergency Medicine | Admitting: Physical Therapy

## 2022-06-10 DIAGNOSIS — S91101D Unspecified open wound of right great toe without damage to nail, subsequent encounter: Secondary | ICD-10-CM | POA: Insufficient documentation

## 2022-06-10 DIAGNOSIS — R2689 Other abnormalities of gait and mobility: Secondary | ICD-10-CM | POA: Diagnosis present

## 2022-06-10 NOTE — Therapy (Signed)
[place], [place], [address] Phone: [phone]   Fax:  [phone]  Wound Care Therapy/discharge  Patient Details  Name: Danny [name] MRN: [id number] Date of Birth: 1965/12/13 Referring Provider (PT): Nanda Quinton  PHYSICAL THERAPY DISCHARGE SUMMARY  Visits from Start of Care: 3  Current functional level related to goals / functional outcomes: See below    Remaining deficits: Wound is closing but not healed    Education / Equipment: Self care   Patient agrees to discharge. Patient goals were partially met. Patient is being discharged due to  financial .  Encounter Date: 06/10/2022   PT End of Session - 06/10/22 1733     Visit Number 3    Number of Visits 3    Date for PT Re-Evaluation 06/28/22    Authorization Type Medicaid - [employer]    Authorization Time Period 2 visits 9/15-10/14    Authorization - Visit Number 2    Authorization - Number of Visits 2    Progress Note Due on Visit 3    PT Start Time 1450    PT Stop Time 1512    PT Time Calculation (min) 22 min    Activity Tolerance Patient tolerated treatment well    Behavior During Therapy [facility] for tasks assessed/performed             Past Medical History:  Diagnosis Date   Alcohol abuse    Anxiety    Back pain    CVA (cerebral infarction)    Depression    Diabetes mellitus    Hypertension    Migraines    Stroke [facility])     Past Surgical History:  Procedure Laterality Date   ABDOMINAL AORTOGRAM W/LOWER EXTREMITY Right 05/06/2022   Procedure: ABDOMINAL AORTOGRAM W/LOWER EXTREMITY;  Surgeon: Waynetta Sandy, MD;  Location: [facility];  Service: Cardiovascular;  Laterality: Right;   CAST APPLICATION Right 6/96/2952   Procedure: CAST APPLICATION;  Surgeon: Carole Civil, MD;  Location: [facility];  Service: Orthopedics;  Laterality: Right;   FOOT SURGERY     INCISION AND DRAINAGE Right 11/02/2021   Procedure:  INCISION AND DRAINAGE RIGHT KNEE;  Surgeon: Carole Civil, MD;  Location: [facility];  Service: Orthopedics;  Laterality: Right;   IR 3D INDEPENDENT WKST  10/11/2021   IR 3D INDEPENDENT WKST  02/28/2022   IR 3D INDEPENDENT WKST  03/14/2022   IR ANGIOGRAM SELECTIVE EACH ADDITIONAL VESSEL  10/11/2021   IR ANGIOGRAM SELECTIVE EACH ADDITIONAL VESSEL  10/11/2021   IR ANGIOGRAM SELECTIVE EACH ADDITIONAL VESSEL  10/11/2021   IR ANGIOGRAM SELECTIVE EACH ADDITIONAL VESSEL  10/11/2021   IR ANGIOGRAM SELECTIVE EACH ADDITIONAL VESSEL  10/11/2021   IR ANGIOGRAM SELECTIVE EACH ADDITIONAL VESSEL  10/11/2021   IR ANGIOGRAM SELECTIVE EACH ADDITIONAL VESSEL  10/25/2021   IR ANGIOGRAM SELECTIVE EACH ADDITIONAL VESSEL  10/25/2021   IR ANGIOGRAM SELECTIVE EACH ADDITIONAL VESSEL  02/28/2022   IR ANGIOGRAM SELECTIVE EACH ADDITIONAL VESSEL  02/28/2022   IR ANGIOGRAM SELECTIVE EACH ADDITIONAL VESSEL  02/28/2022   IR ANGIOGRAM SELECTIVE EACH ADDITIONAL VESSEL  03/14/2022   IR ANGIOGRAM VISCERAL SELECTIVE  10/11/2021   IR ANGIOGRAM VISCERAL SELECTIVE  02/28/2022   IR ANGIOGRAM VISCERAL SELECTIVE  03/14/2022   IR EMBO TUMOR ORGAN ISCHEMIA INFARCT INC GUIDE ROADMAPPING  10/25/2021   IR EMBO TUMOR ORGAN ISCHEMIA INFARCT INC GUIDE ROADMAPPING  10/25/2021   IR EMBO TUMOR ORGAN ISCHEMIA INFARCT INC GUIDE ROADMAPPING  03/14/2022   IR RADIOLOGIST EVAL & MGMT  08/23/2021   IR RADIOLOGIST EVAL & MGMT  11/21/2021   IR RADIOLOGIST EVAL & MGMT  01/24/2022   IR RADIOLOGIST EVAL & MGMT  04/09/2022   IR US GUIDE VASC ACCESS RIGHT  10/11/2021   IR US GUIDE VASC ACCESS RIGHT  10/25/2021   IR US GUIDE VASC ACCESS RIGHT  02/28/2022   IR US GUIDE VASC ACCESS RIGHT  03/14/2022   MANDIBLE SURGERY     PERIPHERAL VASCULAR INTERVENTION Right 05/06/2022   Procedure: PERIPHERAL VASCULAR INTERVENTION;  Surgeon: Waynetta Sandy, MD;  Location: [place] [facility];  Service: Cardiovascular;  Laterality: Right;  SFA   QUADRICEPS TENDON REPAIR Right 09/04/2021   Procedure:  REPAIR QUADRICEP TENDON;  Surgeon: Carole Civil, MD;  Location: [facility];  Service: Orthopedics;  Laterality: Right;   TENDON EXPLORATION Right 11/02/2021   Procedure: QUADRICEPS TENDON AND PATELLAR TENDON EXPLORATION;  Surgeon: Carole Civil, MD;  Location: [facility];  Service: Orthopedics;  Laterality: Right;    There were no vitals filed for this visit.               Wound Therapy - 06/10/22 1734     Subjective pt arrived late for appt.  states he has not touched it since he was here last.  Pt has no more insurance coverage and today will be his last day.    Pain Scale 0-10    Pain Score 0-No pain    Evaluation and Treatment Procedures Explained to Patient/Family Yes   yes   Evaluation and Treatment Procedures agreed to    Wound Properties Date First Assessed: 03/07/22 Time First Assessed: 0840 Wound Type: Other (Comment) Location: Toe (Comment  which one) Location Orientation: Right;Medial Wound Description (Comments): medial Rt great toe Present on Admission: No   Wound Image Images linked: 1    Dressing Type None    Dressing Changed Changed    Dressing Status Old drainage    Dressing Change Frequency PRN    Site / Wound Assessment Dry;Pale    % Wound base Red or Granulating 90%    % Wound base Yellow/Fibrinous Exudate 10%    Peri-wound Assessment Maceration    Wound Length (cm) 0.7 cm was 1.2   Wound Width (cm) 0.6 cm  was .6cm    Wound Depth (cm) 0.3 cm    Wound Volume (cm^3) 0.13 cm^3    Wound Surface Area (cm^2) 0.42 cm^2    Drainage Amount Minimal    Drainage Description Serosanguineous    Treatment Cleansed;Debridement (Selective)    Selective Debridement (non-excisional) - Location medial toe wound    Selective Debridement (non-excisional) - Tools Used Forceps    Selective Debridement (non-excisional) - Tissue Removed slough    Wound Therapy - Clinical Statement debrided large amount of deviatlized tissue and slough from woundbed.  Revealed no change  in size from previous meaurements.  continued with honey dressings and instructed pt to complete this as well.  Caregiver present for session who completes care and will continue to care for the wound.    Wound Therapy - Functional Problem List difficulty in walking and bathing    Factors Delaying/Impairing Wound Healing Altered sensation;Multiple medical problems    Hydrotherapy Plan Debridement;Dressing change;Patient/family education    Wound Therapy - Frequency Other (comment)    Wound Therapy - Current Recommendations --   biweekly   Wound Plan discharge as no insurance coverage    Dressing  honey alginate, 2x2, secured with kling and netting.                       PT Short Term Goals - 06/10/22 1738       PT SHORT TERM GOAL #1   Title Pt wound to be 100% granulated    Time 2    Period Weeks    Status Not Met    Target Date 06/06/22      PT SHORT TERM GOAL #2   Title Pt wound size to be decreased to .8x.4    Time 2    Period Weeks    Status Partially Met               PT Long Term Goals - 06/10/22 1738       PT LONG TERM GOAL #1   Title Pt wound to be healed to reduce risk of infection and assist in normalization of gait    Time 4    Period Weeks    Status Not Met    Target Date 06/28/22                    Patient will benefit from skilled therapeutic intervention in order to improve the following deficits and impairments:     Visit Diagnosis: No diagnosis found.     Problem List Patient Active Problem List   Diagnosis Date Noted   S/P aortogram 05/06/2022   PAD (peripheral artery disease) ([place]) 05/06/2022   Wound infection after surgery 09/27/2021   Postoperative wound cellulitis 09/26/2021   Rupture of right quadriceps tendon    Chronic hepatitis C without hepatic coma ([place]) 08/08/2021   Hepatocellular carcinoma ([place]) 08/08/2021   COPD (chronic obstructive pulmonary disease) ([place]) 08/08/2021   Hypertension 08/08/2021    Other cirrhosis of liver ([place]) 08/08/2021   Type 2 diabetes mellitus ([place]) 08/08/2021   Generalized abdominal pain 06/06/2021   Dysphagia 06/06/2021   Gastroesophageal reflux disease 06/06/2021   Abnormal liver ultrasound 06/06/2021   Colon cancer screening 06/06/2021   Elevated LFTs 06/06/2021   GREATER TROCHANTERIC BURSITIS 07/03/2007   INCREASED BLOOD PRESSURE 07/03/2007   BACK PAIN, LUMBAR 05/29/2007   NUMBNESS 05/29/2007   INSOMNIA, CHRONIC 05/13/2007   HYPERLIPIDEMIA 01/13/2007   ANXIETY DEPRESSION 01/13/2007   SYNDROME, CHRONIC PAIN 01/13/2007   CVA 01/13/2007   ARTHRITIS 01/13/2007   Teena Irani, PTA/CLT [place] Ph: [place], [place] CLT [phone]  06/10/2022, 5:38 PM  [place] [address] [place], [place], [address] Phone: [phone]   Fax:  [phone]  Name: Danny [name] MRN: [id number] Date of Birth: 1966-02-14

## 2022-06-11 ENCOUNTER — Other Ambulatory Visit: Payer: Self-pay | Admitting: Orthopedic Surgery

## 2022-06-11 DIAGNOSIS — T8189XD Other complications of procedures, not elsewhere classified, subsequent encounter: Secondary | ICD-10-CM

## 2022-06-11 MED ORDER — OXYCODONE-ACETAMINOPHEN 10-325 MG PO TABS
1.0000 | ORAL_TABLET | ORAL | 0 refills | Status: DC | PRN
Start: 2022-06-11 — End: 2022-06-14

## 2022-06-11 NOTE — Progress Notes (Unsigned)
Office Note     CC:  follow up Requesting Provider:  Beverly Milch, NP  HPI: JOSPEH [name] is a 56 y.o. (March 21, 1966) male who presents for follow up after Aortogram, Arteriogram right lower extremity with stenting of the SFA by Dr. Donzetta Matters on 05/06/22. This was performed secondary to right lower extremity rest pain with ulceration of the medial right great toe.  In December of 2022 he underwent surgery to repair a torn tendon in his right thigh that occurred on Halloween.  Since that time he had slow to heal wound required reoperation in the right leg and developed a  wound where the cast rubbed on the anterior shin and also developed ulceration of the right great toe medially. On evaluation in the office he had monophasic ABIs in the 0.6 range and no pulses palpable below his right common femoral. There was good results at time of his Angiogram with SFA stenting with less than 20% residual stenosis at site of stent placement. Noted to have two vessel runoff into the foot with PT and AT arteries.   He presents today for follow up with non invasive studies.   He continues to smoke 1/2 ppd  History of hepatocellular carcinoma and CVA  The pt is on a statin for cholesterol management.  The pt is on a daily aspirin.   Other AC:  Plavix The pt is not on medication for hypertension.   The pt is diabetic.  Tobacco hx:  current, 1/2 ppd  Past Medical History:  Diagnosis Date   Alcohol abuse    Anxiety    Back pain    CVA (cerebral infarction)    Depression    Diabetes mellitus    Hypertension    Migraines    Stroke [facility])     Past Surgical History:  Procedure Laterality Date   ABDOMINAL AORTOGRAM W/LOWER EXTREMITY Right 05/06/2022   Procedure: ABDOMINAL AORTOGRAM W/LOWER EXTREMITY;  Surgeon: Waynetta Sandy, MD;  Location: [place] [facility];  Service: Cardiovascular;  Laterality: Right;   CAST APPLICATION Right 3/71/0626   Procedure: CAST APPLICATION;  Surgeon: Carole Civil, MD;  Location: [facility];  Service: Orthopedics;  Laterality: Right;   FOOT SURGERY     INCISION AND DRAINAGE Right 11/02/2021   Procedure: INCISION AND DRAINAGE RIGHT KNEE;  Surgeon: Carole Civil, MD;  Location: [facility];  Service: Orthopedics;  Laterality: Right;   IR 3D INDEPENDENT WKST  10/11/2021   IR 3D INDEPENDENT WKST  02/28/2022   IR 3D INDEPENDENT WKST  03/14/2022   IR ANGIOGRAM SELECTIVE EACH ADDITIONAL VESSEL  10/11/2021   IR ANGIOGRAM SELECTIVE EACH ADDITIONAL VESSEL  10/11/2021   IR ANGIOGRAM SELECTIVE EACH ADDITIONAL VESSEL  10/11/2021   IR ANGIOGRAM SELECTIVE EACH ADDITIONAL VESSEL  10/11/2021   IR ANGIOGRAM SELECTIVE EACH ADDITIONAL VESSEL  10/11/2021   IR ANGIOGRAM SELECTIVE EACH ADDITIONAL VESSEL  10/11/2021   IR ANGIOGRAM SELECTIVE EACH ADDITIONAL VESSEL  10/25/2021   IR ANGIOGRAM SELECTIVE EACH ADDITIONAL VESSEL  10/25/2021   IR ANGIOGRAM SELECTIVE EACH ADDITIONAL VESSEL  02/28/2022   IR ANGIOGRAM SELECTIVE EACH ADDITIONAL VESSEL  02/28/2022   IR ANGIOGRAM SELECTIVE EACH ADDITIONAL VESSEL  02/28/2022   IR ANGIOGRAM SELECTIVE EACH ADDITIONAL VESSEL  03/14/2022   IR ANGIOGRAM VISCERAL SELECTIVE  10/11/2021   IR ANGIOGRAM VISCERAL SELECTIVE  02/28/2022   IR ANGIOGRAM VISCERAL SELECTIVE  03/14/2022   IR EMBO TUMOR ORGAN ISCHEMIA INFARCT INC GUIDE ROADMAPPING  10/25/2021   IR EMBO  TUMOR ORGAN ISCHEMIA INFARCT INC GUIDE ROADMAPPING  10/25/2021   IR EMBO TUMOR ORGAN ISCHEMIA INFARCT INC GUIDE ROADMAPPING  03/14/2022   IR RADIOLOGIST EVAL & MGMT  08/23/2021   IR RADIOLOGIST EVAL & MGMT  11/21/2021   IR RADIOLOGIST EVAL & MGMT  01/24/2022   IR RADIOLOGIST EVAL & MGMT  04/09/2022   IR US GUIDE VASC ACCESS RIGHT  10/11/2021   IR US GUIDE VASC ACCESS RIGHT  10/25/2021   IR US GUIDE VASC ACCESS RIGHT  02/28/2022   IR US GUIDE VASC ACCESS RIGHT  03/14/2022   MANDIBLE SURGERY     PERIPHERAL VASCULAR INTERVENTION Right 05/06/2022   Procedure: PERIPHERAL VASCULAR INTERVENTION;  Surgeon: Waynetta Sandy, MD;  Location: [place] [facility];  Service: Cardiovascular;  Laterality: Right;  SFA   QUADRICEPS TENDON REPAIR Right 09/04/2021   Procedure: REPAIR QUADRICEP TENDON;  Surgeon: Carole Civil, MD;  Location: [facility];  Service: Orthopedics;  Laterality: Right;   TENDON EXPLORATION Right 11/02/2021   Procedure: QUADRICEPS TENDON AND PATELLAR TENDON EXPLORATION;  Surgeon: Carole Civil, MD;  Location: [facility];  Service: Orthopedics;  Laterality: Right;    Social History   Socioeconomic History   Marital status: Divorced    Spouse name: Not on file   Number of children: Not on file   Years of education: Not on file   Highest education level: Not on file  Occupational History   Not on file  Tobacco Use   Smoking status: Every Day    Packs/day: 0.50    Years: 20.00    Total pack years: 10.00    Types: Cigarettes   Smokeless tobacco: Never  Vaping Use   Vaping Use: Never used  Substance and Sexual Activity   Alcohol use: Not Currently    Comment: Quit 11 months ago. Used to drink about 6 pack a day. (documented 06/06/21)   Drug use: Yes    Types: Marijuana    Comment: occas   Sexual activity: Yes    Birth control/protection: None  Other Topics Concern   Not on file  Social History Narrative   Not on file   Social Determinants of Health   Financial Resource Strain: Not on file  Food Insecurity: Not on file  Transportation Needs: Not on file  Physical Activity: Not on file  Stress: Not on file  Social Connections: Not on file  Intimate Partner Violence: Not on file   *** Family History  Problem Relation Age of Onset   Heart failure Mother    Stroke Father    Stroke Sister    Cancer Sister        breast cancer   Colon cancer Neg Hx    Liver cancer Neg Hx     Current Outpatient Medications  Medication Sig Dispense Refill   albuterol (VENTOLIN HFA) 108 (90 Base) MCG/ACT inhaler Inhale 2 puffs into the lungs every 6 (six) hours as needed for  wheezing or shortness of breath.     aspirin EC 81 MG tablet Take 1 tablet (81 mg total) by mouth daily. Swallow whole. 150 tablet 2   cephALEXin (KEFLEX) 500 MG capsule Take 1 capsule (500 mg total) by mouth 4 (four) times daily. 28 capsule 0   Cholecalciferol (VITAMIN D-3) 125 MCG (5000 UT) TABS Take 5,000 Units by mouth daily.     citalopram (CELEXA) 20 MG tablet Take 20 mg by mouth daily.     clopidogrel (PLAVIX) 75 MG tablet Take  1 tablet (75 mg total) by mouth daily. 30 tablet 11   cyclobenzaprine (FLEXERIL) 5 MG tablet Take 5 mg by mouth 3 (three) times daily as needed for muscle spasms.     gabapentin (NEURONTIN) 600 MG tablet Take 600 mg by mouth in the morning and at bedtime.     Melatonin 10 MG CAPS Take 10 mg by mouth at bedtime.     metFORMIN (GLUCOPHAGE) 500 MG tablet Take 1 tablet (500 mg total) by mouth 2 (two) times daily with a meal. 60 tablet 2   naloxone (NARCAN) nasal spray 4 mg/0.1 mL Place 1 spray into the nose once.     oxyCODONE-acetaminophen (PERCOCET) 10-325 MG tablet Take 1 tablet by mouth every 4 (four) hours as needed for pain. 30 tablet 0   pantoprazole (PROTONIX) 40 MG tablet Take 1 tablet (40 mg total) by mouth daily. 30 tablet 2   QUEtiapine (SEROQUEL) 100 MG tablet Take 100 mg by mouth at bedtime.     rosuvastatin (CRESTOR) 10 MG tablet Take 1 tablet (10 mg total) by mouth daily. 30 tablet 11   sulfamethoxazole-trimethoprim (BACTRIM DS) 800-160 MG tablet Take 1 tablet by mouth 2 (two) times daily.     No current facility-administered medications for this visit.    Allergies  Allergen Reactions   Ultram [Tramadol] Hives   Benadryl [Diphenhydramine] Itching and Rash     REVIEW OF SYSTEMS:  *** '[X]'$  denotes positive finding, '[ ]'$  denotes negative finding Cardiac  Comments:  Chest pain or chest pressure:    Shortness of breath upon exertion:    Short of breath when lying flat:    Irregular heart rhythm:        Vascular    Pain in calf, thigh, or hip  brought on by ambulation:    Pain in feet at night that wakes you up from your sleep:     Blood clot in your veins:    Leg swelling:         Pulmonary    Oxygen at home:    Productive cough:     Wheezing:         Neurologic    Sudden weakness in arms or legs:     Sudden numbness in arms or legs:     Sudden onset of difficulty speaking or slurred speech:    Temporary loss of vision in one eye:     Problems with dizziness:         Gastrointestinal    Blood in stool:     Vomited blood:         Genitourinary    Burning when urinating:     Blood in urine:        Psychiatric    Major depression:         Hematologic    Bleeding problems:    Problems with blood clotting too easily:        Skin    Rashes or ulcers:        Constitutional    Fever or chills:      PHYSICAL EXAMINATION:  There were no vitals filed for this visit.  General:  WDWN in NAD; vital signs documented above Gait: Not observed HENT: WNL, normocephalic Pulmonary: normal non-labored breathing , without Rales, rhonchi,  wheezing Cardiac: {Desc; regular/irreg:14544} HR, without  Murmurs {With/Without:20273} carotid bruit*** Abdomen: soft, NT, no masses Skin: {With/Without:20273} rashes Vascular Exam/Pulses:  Right Left  Radial {Exam; arterial pulse strength 0-4:30167} {Exam; arterial  pulse strength 0-4:30167}  Ulnar {Exam; arterial pulse strength 0-4:30167} {Exam; arterial pulse strength 0-4:30167}  Femoral {Exam; arterial pulse strength 0-4:30167} {Exam; arterial pulse strength 0-4:30167}  Popliteal {Exam; arterial pulse strength 0-4:30167} {Exam; arterial pulse strength 0-4:30167}  DP {Exam; arterial pulse strength 0-4:30167} {Exam; arterial pulse strength 0-4:30167}  PT {Exam; arterial pulse strength 0-4:30167} {Exam; arterial pulse strength 0-4:30167}   Extremities: {With/Without:20273} ischemic changes, {With/Without:20273} Gangrene , {With/Without:20273} cellulitis; {With/Without:20273} open  wounds;  Musculoskeletal: no muscle wasting or atrophy  Neurologic: A&O X 3;  No focal weakness or paresthesias are detected Psychiatric:  The pt has {Desc; normal/abnormal:11317::"Normal"} affect.   Non-Invasive Vascular Imaging:   ***    ASSESSMENT/PLAN:: 56 y.o. male here for follow up for ***   -***   Karoline Caldwell, PA-C [facility] MD:   [place]/ Donzetta Matters

## 2022-06-12 ENCOUNTER — Ambulatory Visit (HOSPITAL_COMMUNITY): Payer: Medicaid Other

## 2022-06-12 ENCOUNTER — Ambulatory Visit: Payer: Medicaid Other

## 2022-06-12 DIAGNOSIS — I739 Peripheral vascular disease, unspecified: Secondary | ICD-10-CM

## 2022-06-12 DIAGNOSIS — I7025 Atherosclerosis of native arteries of other extremities with ulceration: Secondary | ICD-10-CM

## 2022-06-13 ENCOUNTER — Ambulatory Visit (HOSPITAL_COMMUNITY): Payer: Medicaid Other | Admitting: Physical Therapy

## 2022-06-14 ENCOUNTER — Other Ambulatory Visit: Payer: Self-pay | Admitting: Orthopedic Surgery

## 2022-06-14 DIAGNOSIS — T8189XD Other complications of procedures, not elsewhere classified, subsequent encounter: Secondary | ICD-10-CM

## 2022-06-17 MED ORDER — OXYCODONE-ACETAMINOPHEN 10-325 MG PO TABS: 1.0000 | ORAL_TABLET | ORAL | 0 refills | Status: DC | PRN

## 2022-06-19 ENCOUNTER — Ambulatory Visit (HOSPITAL_COMMUNITY)
Admission: RE | Admit: 2022-06-19 | Discharge: 2022-06-19 | Disposition: A | Discharge status: Home / Self Care | Payer: Medicaid Other | Source: Ambulatory Visit | Attending: Vascular Surgery | Admitting: Vascular Surgery

## 2022-06-19 ENCOUNTER — Ambulatory Visit (INDEPENDENT_AMBULATORY_CARE_PROVIDER_SITE_OTHER)
Admission: RE | Admit: 2022-06-19 | Discharge: 2022-06-19 | Disposition: A | Discharge status: Home / Self Care | Payer: Medicaid Other | Source: Ambulatory Visit | Attending: Vascular Surgery | Admitting: Vascular Surgery

## 2022-06-19 ENCOUNTER — Ambulatory Visit (INDEPENDENT_AMBULATORY_CARE_PROVIDER_SITE_OTHER): Payer: Medicaid Other | Admitting: Physician Assistant

## 2022-06-19 VITALS — BP 120/66 | HR 68 | Temp 97.7°F | Resp 16 | Ht 69.0 in | Wt 157.4 lb

## 2022-06-19 DIAGNOSIS — I7025 Atherosclerosis of native arteries of other extremities with ulceration: Secondary | ICD-10-CM | POA: Diagnosis not present

## 2022-06-19 DIAGNOSIS — I739 Peripheral vascular disease, unspecified: Secondary | ICD-10-CM

## 2022-06-19 DIAGNOSIS — I70235 Atherosclerosis of native arteries of right leg with ulceration of other part of foot: Secondary | ICD-10-CM | POA: Diagnosis present

## 2022-06-19 NOTE — Progress Notes (Signed)
Office Note     CC:  follow up Requesting Provider:  Beverly Milch, NP  HPI: Danny [name] is a 56 y.o. ([birth year]) male who presents status post right lower extremity arteriogram with stenting of the right SFA by Dr. Donzetta Matters on 05/06/2022 due to critical limb ischemia with great toe ulceration.  Patient states the wound immediately began to show signs of healing after stenting.  Left groin catheterization site is without pain or bruising.  He continues to take his aspirin, Plavix, statin daily.  He continues to be a daily tobacco user.    Past Medical History:  Diagnosis Date   Alcohol abuse    Anxiety    Back pain    CVA (cerebral infarction)    Depression    Diabetes mellitus    Hypertension    Migraines    Stroke [facility])     Past Surgical History:  Procedure Laterality Date   ABDOMINAL AORTOGRAM W/LOWER EXTREMITY Right 05/06/2022   Procedure: ABDOMINAL AORTOGRAM W/LOWER EXTREMITY;  Surgeon: Waynetta Sandy, MD;  Location: [place] [facility];  Service: Cardiovascular;  Laterality: Right;   CAST APPLICATION Right 2/94/7654   Procedure: CAST APPLICATION;  Surgeon: Carole Civil, MD;  Location: [facility];  Service: Orthopedics;  Laterality: Right;   FOOT SURGERY     INCISION AND DRAINAGE Right 11/02/2021   Procedure: INCISION AND DRAINAGE RIGHT KNEE;  Surgeon: Carole Civil, MD;  Location: [facility];  Service: Orthopedics;  Laterality: Right;   IR 3D INDEPENDENT WKST  10/11/2021   IR 3D INDEPENDENT WKST  02/28/2022   IR 3D INDEPENDENT WKST  03/14/2022   IR ANGIOGRAM SELECTIVE EACH ADDITIONAL VESSEL  10/11/2021   IR ANGIOGRAM SELECTIVE EACH ADDITIONAL VESSEL  10/11/2021   IR ANGIOGRAM SELECTIVE EACH ADDITIONAL VESSEL  10/11/2021   IR ANGIOGRAM SELECTIVE EACH ADDITIONAL VESSEL  10/11/2021   IR ANGIOGRAM SELECTIVE EACH ADDITIONAL VESSEL  10/11/2021   IR ANGIOGRAM SELECTIVE EACH ADDITIONAL VESSEL  10/11/2021   IR ANGIOGRAM SELECTIVE EACH ADDITIONAL VESSEL  10/25/2021   IR  ANGIOGRAM SELECTIVE EACH ADDITIONAL VESSEL  10/25/2021   IR ANGIOGRAM SELECTIVE EACH ADDITIONAL VESSEL  02/28/2022   IR ANGIOGRAM SELECTIVE EACH ADDITIONAL VESSEL  02/28/2022   IR ANGIOGRAM SELECTIVE EACH ADDITIONAL VESSEL  02/28/2022   IR ANGIOGRAM SELECTIVE EACH ADDITIONAL VESSEL  03/14/2022   IR ANGIOGRAM VISCERAL SELECTIVE  10/11/2021   IR ANGIOGRAM VISCERAL SELECTIVE  02/28/2022   IR ANGIOGRAM VISCERAL SELECTIVE  03/14/2022   IR EMBO TUMOR ORGAN ISCHEMIA INFARCT INC GUIDE ROADMAPPING  10/25/2021   IR EMBO TUMOR ORGAN ISCHEMIA INFARCT INC GUIDE ROADMAPPING  10/25/2021   IR EMBO TUMOR ORGAN ISCHEMIA INFARCT INC GUIDE ROADMAPPING  03/14/2022   IR RADIOLOGIST EVAL & MGMT  08/23/2021   IR RADIOLOGIST EVAL & MGMT  11/21/2021   IR RADIOLOGIST EVAL & MGMT  01/24/2022   IR RADIOLOGIST EVAL & MGMT  04/09/2022   IR US GUIDE VASC ACCESS RIGHT  10/11/2021   IR US GUIDE VASC ACCESS RIGHT  10/25/2021   IR US GUIDE VASC ACCESS RIGHT  02/28/2022   IR US GUIDE VASC ACCESS RIGHT  03/14/2022   MANDIBLE SURGERY     PERIPHERAL VASCULAR INTERVENTION Right 05/06/2022   Procedure: PERIPHERAL VASCULAR INTERVENTION;  Surgeon: Waynetta Sandy, MD;  Location: [place] [facility];  Service: Cardiovascular;  Laterality: Right;  SFA   QUADRICEPS TENDON REPAIR Right 09/04/2021   Procedure: REPAIR QUADRICEP TENDON;  Surgeon: Carole Civil, MD;  Location: [facility];  Service: Orthopedics;  Laterality: Right;   TENDON EXPLORATION Right 11/02/2021   Procedure: QUADRICEPS TENDON AND PATELLAR TENDON EXPLORATION;  Surgeon: Carole Civil, MD;  Location: [facility];  Service: Orthopedics;  Laterality: Right;    Social History   Socioeconomic History   Marital status: Divorced    Spouse name: Not on file   Number of children: Not on file   Years of education: Not on file   Highest education level: Not on file  Occupational History   Not on file  Tobacco Use   Smoking status: Every Day    Packs/day: 0.50    Years: 20.00     Total pack years: 10.00    Types: Cigarettes   Smokeless tobacco: Never  Vaping Use   Vaping Use: Never used  Substance and Sexual Activity   Alcohol use: Not Currently    Comment: Quit 11 months ago. Used to drink about 6 pack a day. (documented 06/06/21)   Drug use: Yes    Types: Marijuana    Comment: occas   Sexual activity: Yes    Birth control/protection: None  Other Topics Concern   Not on file  Social History Narrative   Not on file   Social Determinants of Health   Financial Resource Strain: Not on file  Food Insecurity: Not on file  Transportation Needs: Not on file  Physical Activity: Not on file  Stress: Not on file  Social Connections: Not on file  Intimate Partner Violence: Not on file    Family History  Problem Relation Age of Onset   Heart failure Mother    Stroke Father    Stroke Sister    Cancer Sister        breast cancer   Colon cancer Neg Hx    Liver cancer Neg Hx     Current Outpatient Medications  Medication Sig Dispense Refill   albuterol (VENTOLIN HFA) 108 (90 Base) MCG/ACT inhaler Inhale 2 puffs into the lungs every 6 (six) hours as needed for wheezing or shortness of breath.     aspirin EC 81 MG tablet Take 1 tablet (81 mg total) by mouth daily. Swallow whole. 150 tablet 2   cephALEXin (KEFLEX) 500 MG capsule Take 1 capsule (500 mg total) by mouth 4 (four) times daily. 28 capsule 0   Cholecalciferol (VITAMIN D-3) 125 MCG (5000 UT) TABS Take 5,000 Units by mouth daily.     citalopram (CELEXA) 20 MG tablet Take 20 mg by mouth daily.     clopidogrel (PLAVIX) 75 MG tablet Take 1 tablet (75 mg total) by mouth daily. 30 tablet 11   cyclobenzaprine (FLEXERIL) 5 MG tablet Take 5 mg by mouth 3 (three) times daily as needed for muscle spasms.     gabapentin (NEURONTIN) 600 MG tablet Take 600 mg by mouth in the morning and at bedtime.     Melatonin 10 MG CAPS Take 10 mg by mouth at bedtime.     metFORMIN (GLUCOPHAGE) 500 MG tablet Take 1 tablet (500 mg  total) by mouth 2 (two) times daily with a meal. 60 tablet 2   naloxone (NARCAN) nasal spray 4 mg/0.1 mL Place 1 spray into the nose once.     oxyCODONE-acetaminophen (PERCOCET) 10-325 MG tablet Take 1 tablet by mouth every 4 (four) hours as needed for pain. 30 tablet 0   pantoprazole (PROTONIX) 40 MG tablet Take 1 tablet (40 mg total) by mouth daily. 30 tablet 2   QUEtiapine (  SEROQUEL) 100 MG tablet Take 100 mg by mouth at bedtime.     rosuvastatin (CRESTOR) 10 MG tablet Take 1 tablet (10 mg total) by mouth daily. 30 tablet 11   sulfamethoxazole-trimethoprim (BACTRIM DS) 800-160 MG tablet Take 1 tablet by mouth 2 (two) times daily.     No current facility-administered medications for this visit.    Allergies  Allergen Reactions   Ultram [Tramadol] Hives   Benadryl [Diphenhydramine] Itching and Rash     REVIEW OF SYSTEMS:   '[X]'$  denotes positive finding, '[ ]'$  denotes negative finding Cardiac  Comments:  Chest pain or chest pressure:    Shortness of breath upon exertion:    Short of breath when lying flat:    Irregular heart rhythm:        Vascular    Pain in calf, thigh, or hip brought on by ambulation:    Pain in feet at night that wakes you up from your sleep:     Blood clot in your veins:    Leg swelling:         Pulmonary    Oxygen at home:    Productive cough:     Wheezing:         Neurologic    Sudden weakness in arms or legs:     Sudden numbness in arms or legs:     Sudden onset of difficulty speaking or slurred speech:    Temporary loss of vision in one eye:     Problems with dizziness:         Gastrointestinal    Blood in stool:     Vomited blood:         Genitourinary    Burning when urinating:     Blood in urine:        Psychiatric    Major depression:         Hematologic    Bleeding problems:    Problems with blood clotting too easily:        Skin    Rashes or ulcers:        Constitutional    Fever or chills:      PHYSICAL  EXAMINATION:  Vitals:   06/19/22 1525  BP: 120/66  Pulse: 68  Resp: 16  Temp: 97.7 F (36.5 C)  TempSrc: Temporal  SpO2: 97%  Weight: 157 lb 6.4 oz (71.4 kg)  Height: '5\' 9"'$  (1.753 m)    General:  WDWN in NAD; vital signs documented above Gait: Not observed HENT: WNL, normocephalic Pulmonary: normal non-labored breathing , without Rales, rhonchi,  wheezing Cardiac: regular HR Abdomen: soft, NT, no masses Skin: without rashes Vascular Exam/Pulses: Palpable right DP pulse Musculoskeletal: no muscle wasting or atrophy  Neurologic: A&O X 3;  No focal weakness or paresthesias are detected Psychiatric:  The pt has Normal affect.   Non-Invasive Vascular Imaging:   Right lower extremity arterial duplex demonstrates widely patent SFA stent  ABI/TBIToday's ABIToday's TBIPrevious ABIPrevious TBI               +-------+-----------+-----------+------------+-------------------------+  Right  0.99       0.81       0.62        not obtained due to wound  +-------+-----------+-----------+------------+-------------------------+  Left   0.95       0.75       0.94        0.71                       +-------+-----------+-----------+------------+-------------------------+  ASSESSMENT/PLAN:: 56 y.o. male status post right SFA stenting due to critical limb ischemia with right great toe ulceration  -Subjectively the right great toe wound has significantly improved since the procedure -On exam, patient has a palpable right DP pulse.  Arterial duplex demonstrates a widely patent stent -Continue aspirin, Plavix, statin daily -Encouraged tobacco cessation -Recheck arterial duplex in 3 months to make sure great toe wound has healed   Dagoberto Ligas, PA-C [facility] [phone]  Clinic MD:   Donzetta Matters

## 2022-06-21 ENCOUNTER — Other Ambulatory Visit: Payer: Self-pay

## 2022-06-21 ENCOUNTER — Other Ambulatory Visit: Payer: Self-pay | Admitting: Orthopedic Surgery

## 2022-06-21 DIAGNOSIS — I70235 Atherosclerosis of native arteries of right leg with ulceration of other part of foot: Secondary | ICD-10-CM

## 2022-06-21 DIAGNOSIS — I739 Peripheral vascular disease, unspecified: Secondary | ICD-10-CM

## 2022-06-21 DIAGNOSIS — T8189XD Other complications of procedures, not elsewhere classified, subsequent encounter: Secondary | ICD-10-CM

## 2022-06-21 DIAGNOSIS — I7025 Atherosclerosis of native arteries of other extremities with ulceration: Secondary | ICD-10-CM

## 2022-06-24 MED ORDER — OXYCODONE-ACETAMINOPHEN 10-325 MG PO TABS: 1.0000 | ORAL_TABLET | ORAL | 0 refills | Status: DC | PRN

## 2022-06-27 ENCOUNTER — Ambulatory Visit (HOSPITAL_COMMUNITY): Payer: Medicaid Other

## 2022-06-27 ENCOUNTER — Other Ambulatory Visit: Payer: Self-pay | Admitting: Orthopedic Surgery

## 2022-06-27 DIAGNOSIS — T8189XD Other complications of procedures, not elsewhere classified, subsequent encounter: Secondary | ICD-10-CM

## 2022-06-27 MED ORDER — OXYCODONE-ACETAMINOPHEN 10-325 MG PO TABS: 1.0000 | ORAL_TABLET | ORAL | 0 refills | Status: DC | PRN

## 2022-07-01 ENCOUNTER — Other Ambulatory Visit: Payer: Self-pay | Admitting: Orthopedic Surgery

## 2022-07-01 DIAGNOSIS — T8189XD Other complications of procedures, not elsewhere classified, subsequent encounter: Secondary | ICD-10-CM

## 2022-07-02 ENCOUNTER — Other Ambulatory Visit (HOSPITAL_COMMUNITY): Payer: Self-pay

## 2022-07-02 DIAGNOSIS — K7682 Hepatic encephalopathy: Secondary | ICD-10-CM | POA: Insufficient documentation

## 2022-07-02 MED ORDER — OXYCODONE-ACETAMINOPHEN 10-325 MG PO TABS
1.0000 | ORAL_TABLET | ORAL | 0 refills | Status: DC | PRN
  Filled 2022-07-02: qty 30, 5d supply, fill #0

## 2022-07-05 ENCOUNTER — Other Ambulatory Visit: Payer: Self-pay | Admitting: Orthopedic Surgery

## 2022-07-05 ENCOUNTER — Encounter: Payer: Self-pay | Admitting: Orthopedic Surgery

## 2022-07-05 ENCOUNTER — Inpatient Hospital Stay: Admission: RE | Admit: 2022-07-05 | Payer: Medicaid Other | Source: Ambulatory Visit

## 2022-07-05 DIAGNOSIS — T8189XD Other complications of procedures, not elsewhere classified, subsequent encounter: Secondary | ICD-10-CM

## 2022-07-06 ENCOUNTER — Other Ambulatory Visit (HOSPITAL_COMMUNITY): Payer: Self-pay

## 2022-07-08 MED ORDER — OXYCODONE-ACETAMINOPHEN 10-325 MG PO TABS: 1.0000 | ORAL_TABLET | ORAL | 0 refills | Status: DC | PRN

## 2022-07-08 NOTE — Telephone Encounter (Signed)
Danny [name] is his DPR,  is in the office and wants to know when will his RX be filled, he is out of his medicine   Medicine :  oxyCODONE-acetaminophen (PERCOCET) 10-325 MG tablet  PLEASE SEND THIS TO [place].   Please call Danny [name] at [phone] when it has been sent in so she will know to go and pick it up.

## 2022-07-10 ENCOUNTER — Telehealth: Payer: Self-pay | Admitting: Gastroenterology

## 2022-07-10 NOTE — Telephone Encounter (Signed)
Patient no showed to his follow-up in April with Dr. Abbey Chatters. He recent saw Roosevelt Locks, NP with [facility] for follow-up who again recommended he see his primary GI team for first ever EGD.   Mandy:  Please arrange a follow-up with Dr. Abbey Chatters for cirrhosis, discuss scheduling EGD.

## 2022-07-12 ENCOUNTER — Other Ambulatory Visit: Payer: Self-pay | Admitting: Orthopedic Surgery

## 2022-07-12 DIAGNOSIS — T8189XD Other complications of procedures, not elsewhere classified, subsequent encounter: Secondary | ICD-10-CM

## 2022-07-14 ENCOUNTER — Other Ambulatory Visit: Payer: Self-pay | Admitting: Orthopedic Surgery

## 2022-07-14 DIAGNOSIS — T8189XD Other complications of procedures, not elsewhere classified, subsequent encounter: Secondary | ICD-10-CM

## 2022-07-15 MED ORDER — OXYCODONE-ACETAMINOPHEN 10-325 MG PO TABS: 1.0000 | ORAL_TABLET | ORAL | 0 refills | Status: DC | PRN

## 2022-07-18 ENCOUNTER — Other Ambulatory Visit: Payer: Self-pay | Admitting: Orthopedic Surgery

## 2022-07-18 ENCOUNTER — Ambulatory Visit (HOSPITAL_COMMUNITY)
Admission: RE | Admit: 2022-07-18 | Discharge: 2022-07-18 | Disposition: A | Discharge status: Home / Self Care | Payer: Medicaid Other | Source: Ambulatory Visit | Attending: Interventional Radiology | Admitting: Interventional Radiology

## 2022-07-18 DIAGNOSIS — C220 Liver cell carcinoma: Secondary | ICD-10-CM | POA: Diagnosis present

## 2022-07-18 DIAGNOSIS — T8189XD Other complications of procedures, not elsewhere classified, subsequent encounter: Secondary | ICD-10-CM

## 2022-07-18 MED ORDER — GADOBUTROL 1 MMOL/ML IV SOLN
7.0000 mL | Freq: Once | INTRAVENOUS | Status: AC | PRN
  Administered 2022-07-18: 7 mL via INTRAVENOUS

## 2022-07-18 MED ORDER — OXYCODONE-ACETAMINOPHEN 10-325 MG PO TABS: 1.0000 | ORAL_TABLET | ORAL | 0 refills | Status: DC | PRN

## 2022-07-25 ENCOUNTER — Other Ambulatory Visit: Payer: Self-pay | Admitting: Orthopedic Surgery

## 2022-07-25 DIAGNOSIS — T8189XD Other complications of procedures, not elsewhere classified, subsequent encounter: Secondary | ICD-10-CM

## 2022-07-25 MED ORDER — OXYCODONE-ACETAMINOPHEN 10-325 MG PO TABS: 1.0000 | ORAL_TABLET | ORAL | 0 refills | Status: DC | PRN

## 2022-07-30 ENCOUNTER — Other Ambulatory Visit: Payer: Self-pay | Admitting: Orthopedic Surgery

## 2022-07-30 DIAGNOSIS — T8189XD Other complications of procedures, not elsewhere classified, subsequent encounter: Secondary | ICD-10-CM

## 2022-07-30 MED ORDER — OXYCODONE-ACETAMINOPHEN 10-325 MG PO TABS: 1.0000 | ORAL_TABLET | ORAL | 0 refills | Status: DC | PRN

## 2022-08-04 ENCOUNTER — Other Ambulatory Visit: Payer: Self-pay | Admitting: Orthopedic Surgery

## 2022-08-04 DIAGNOSIS — T8189XD Other complications of procedures, not elsewhere classified, subsequent encounter: Secondary | ICD-10-CM

## 2022-08-05 ENCOUNTER — Ambulatory Visit
Admission: RE | Admit: 2022-08-05 | Discharge: 2022-08-05 | Disposition: A | Discharge status: Home / Self Care | Payer: Medicaid Other | Source: Ambulatory Visit | Attending: Interventional Radiology | Admitting: Interventional Radiology

## 2022-08-05 DIAGNOSIS — C220 Liver cell carcinoma: Secondary | ICD-10-CM

## 2022-08-05 HISTORY — PX: IR RADIOLOGIST EVAL & MGMT: IMG5224

## 2022-08-05 MED ORDER — OXYCODONE-ACETAMINOPHEN 10-325 MG PO TABS: 1.0000 | ORAL_TABLET | ORAL | 0 refills | Status: DC | PRN

## 2022-08-05 NOTE — Progress Notes (Signed)
Referring Physician(s): Roosevelt Locks, NP   Reason for follow up:  4 months status post right transarterial radioembolization, and 9 months status post left transarterial radioembolization   He presents today via virtual telephone office visit.   History of present illness: Danny [name] is a 56 y.o. male with history of alcoholic cirrhosis and multifocal hepatocellular carcinoma.  He had complained of right upper quadrant pain for approximately 1 year.  Multiple imaging studies eventually led to a CT which demonstrated enhancing lesions in the left lobe of the liver.  This prompted and MRI which demonstrates a 2.7 cm LIRADS 5 mass in segment II and LIRADS 3 lesion in the right dome.  There was a 1.6 cm mass on prior CTA in segment III which was inconspicuous on MR, but highly suspicious for additional HCC.  For this, he is status post left hepatic TARE on 10/25/21.  Approximately 50% of the dose was delivered in segmentectomy fashion to the index segment II hepatoma followed by delivery of the remaining in a lobar distribution.  Initial 3 month post-TARE MRI abdomen showed enlargement of right sided masses and good response of the left sided, treated masses.  Therefore, he underwent additional mapping and then segment VIII radiation segmentectomy on 03/14/22.    He continues to overall do very well.  He underwent R SFA stent placement by Dr. Donzetta Matters in August.  He continues to endorse a mild aching pain under his right ribs, without any exacerbating factors.  He endorses a good appetite.  No jaundice or scleral icterus, nausea, vomiting.        Past Medical History:  Diagnosis Date   Alcohol abuse    Anxiety    Back pain    CVA (cerebral infarction)    Depression    Diabetes mellitus    Hypertension    Migraines    Stroke [facility])     Past Surgical History:  Procedure Laterality Date   ABDOMINAL AORTOGRAM W/LOWER EXTREMITY Right 05/06/2022   Procedure: ABDOMINAL AORTOGRAM W/LOWER EXTREMITY;   Surgeon: Waynetta Sandy, MD;  Location: [place] [facility];  Service: Cardiovascular;  Laterality: Right;   CAST APPLICATION Right 4/43/1540   Procedure: CAST APPLICATION;  Surgeon: Carole Civil, MD;  Location: [facility];  Service: Orthopedics;  Laterality: Right;   FOOT SURGERY     INCISION AND DRAINAGE Right 11/02/2021   Procedure: INCISION AND DRAINAGE RIGHT KNEE;  Surgeon: Carole Civil, MD;  Location: [facility];  Service: Orthopedics;  Laterality: Right;   IR 3D INDEPENDENT WKST  10/11/2021   IR 3D INDEPENDENT WKST  02/28/2022   IR 3D INDEPENDENT WKST  03/14/2022   IR ANGIOGRAM SELECTIVE EACH ADDITIONAL VESSEL  10/11/2021   IR ANGIOGRAM SELECTIVE EACH ADDITIONAL VESSEL  10/11/2021   IR ANGIOGRAM SELECTIVE EACH ADDITIONAL VESSEL  10/11/2021   IR ANGIOGRAM SELECTIVE EACH ADDITIONAL VESSEL  10/11/2021   IR ANGIOGRAM SELECTIVE EACH ADDITIONAL VESSEL  10/11/2021   IR ANGIOGRAM SELECTIVE EACH ADDITIONAL VESSEL  10/11/2021   IR ANGIOGRAM SELECTIVE EACH ADDITIONAL VESSEL  10/25/2021   IR ANGIOGRAM SELECTIVE EACH ADDITIONAL VESSEL  10/25/2021   IR ANGIOGRAM SELECTIVE EACH ADDITIONAL VESSEL  02/28/2022   IR ANGIOGRAM SELECTIVE EACH ADDITIONAL VESSEL  02/28/2022   IR ANGIOGRAM SELECTIVE EACH ADDITIONAL VESSEL  02/28/2022   IR ANGIOGRAM SELECTIVE EACH ADDITIONAL VESSEL  03/14/2022   IR ANGIOGRAM VISCERAL SELECTIVE  10/11/2021   IR ANGIOGRAM VISCERAL SELECTIVE  02/28/2022   IR ANGIOGRAM VISCERAL SELECTIVE  03/14/2022   IR EMBO TUMOR ORGAN ISCHEMIA INFARCT INC GUIDE ROADMAPPING  10/25/2021   IR EMBO TUMOR ORGAN ISCHEMIA INFARCT INC GUIDE ROADMAPPING  10/25/2021   IR EMBO TUMOR ORGAN ISCHEMIA INFARCT INC GUIDE ROADMAPPING  03/14/2022   IR RADIOLOGIST EVAL & MGMT  08/23/2021   IR RADIOLOGIST EVAL & MGMT  11/21/2021   IR RADIOLOGIST EVAL & MGMT  01/24/2022   IR RADIOLOGIST EVAL & MGMT  04/09/2022   IR US GUIDE VASC ACCESS RIGHT  10/11/2021   IR US GUIDE VASC ACCESS RIGHT  10/25/2021   IR US GUIDE VASC ACCESS RIGHT   02/28/2022   IR US GUIDE VASC ACCESS RIGHT  03/14/2022   MANDIBLE SURGERY     PERIPHERAL VASCULAR INTERVENTION Right 05/06/2022   Procedure: PERIPHERAL VASCULAR INTERVENTION;  Surgeon: Waynetta Sandy, MD;  Location: [place] [facility];  Service: Cardiovascular;  Laterality: Right;  SFA   QUADRICEPS TENDON REPAIR Right 09/04/2021   Procedure: REPAIR QUADRICEP TENDON;  Surgeon: Carole Civil, MD;  Location: [facility];  Service: Orthopedics;  Laterality: Right;   TENDON EXPLORATION Right 11/02/2021   Procedure: QUADRICEPS TENDON AND PATELLAR TENDON EXPLORATION;  Surgeon: Carole Civil, MD;  Location: [facility];  Service: Orthopedics;  Laterality: Right;    Allergies: Ultram [tramadol] and Benadryl [diphenhydramine]  Medications: Prior to Admission medications   Medication Sig Start Date End Date Taking? Authorizing Provider  albuterol (VENTOLIN HFA) 108 (90 Base) MCG/ACT inhaler Inhale 2 puffs into the lungs every 6 (six) hours as needed for wheezing or shortness of breath.    [provider]  aspirin EC 81 MG tablet Take 1 tablet (81 mg total) by mouth daily. Swallow whole. 05/07/22 07/31/23  Dagoberto Ligas, PA-C  cephALEXin (KEFLEX) 500 MG capsule Take 1 capsule (500 mg total) by mouth 4 (four) times daily. 04/20/22   Triplett, Tammy, PA-C  Cholecalciferol (VITAMIN D-3) 125 MCG (5000 UT) TABS Take 5,000 Units by mouth daily.    [provider]  citalopram (CELEXA) 20 MG tablet Take 20 mg by mouth daily.    [provider]  clopidogrel (PLAVIX) 75 MG tablet Take 1 tablet (75 mg total) by mouth daily. 05/06/22 05/06/23  Waynetta Sandy, MD  cyclobenzaprine (FLEXERIL) 5 MG tablet Take 5 mg by mouth 3 (three) times daily as needed for muscle spasms. 03/07/22   [provider]  gabapentin (NEURONTIN) 600 MG tablet Take 600 mg by mouth in the morning and at bedtime. 06/24/21   [provider]  Melatonin 10 MG CAPS Take 10 mg by mouth  at bedtime.    [provider]  metFORMIN (GLUCOPHAGE) 500 MG tablet Take 1 tablet (500 mg total) by mouth 2 (two) times daily with a meal. 01/01/18   Julianne Rice, MD  naloxone [facility]) nasal spray 4 mg/0.1 mL Place 1 spray into the nose once. 01/01/22   [provider]  oxyCODONE-acetaminophen (PERCOCET) 10-325 MG tablet Take 1 tablet by mouth every 4 (four) hours as needed for pain. 08/05/22   Carole Civil, MD  pantoprazole (PROTONIX) 40 MG tablet Take 1 tablet (40 mg total) by mouth daily. 05/07/22 05/07/23  Dagoberto Ligas, PA-C  QUEtiapine (SEROQUEL) 100 MG tablet Take 100 mg by mouth at bedtime.    [provider]  rosuvastatin (CRESTOR) 10 MG tablet Take 1 tablet (10 mg total) by mouth daily. 05/06/22 05/06/23  Waynetta Sandy, MD  sulfamethoxazole-trimethoprim (BACTRIM DS) 800-160 MG tablet Take 1 tablet by mouth 2 (  two) times daily. 04/22/22   [provider]     Family History  Problem Relation Age of Onset   Heart failure Mother    Stroke Father    Stroke Sister    Cancer Sister        breast cancer   Colon cancer Neg Hx    Liver cancer Neg Hx     Social History   Socioeconomic History   Marital status: Divorced    Spouse name: Not on file   Number of children: Not on file   Years of education: Not on file   Highest education level: Not on file  Occupational History   Not on file  Tobacco Use   Smoking status: Every Day    Packs/day: 0.50    Years: 20.00    Total pack years: 10.00    Types: Cigarettes   Smokeless tobacco: Never  Vaping Use   Vaping Use: Never used  Substance and Sexual Activity   Alcohol use: Not Currently    Comment: Quit 11 months ago. Used to drink about 6 pack a day. (documented 06/06/21)   Drug use: Yes    Types: Marijuana    Comment: occas   Sexual activity: Yes    Birth control/protection: None  Other Topics Concern   Not on file  Social History Narrative   Not on file   Social  Determinants of Health   Financial Resource Strain: Not on file  Food Insecurity: Not on file  Transportation Needs: Not on file  Physical Activity: Not on file  Stress: Not on file  Social Connections: Not on file     Vital Signs: There were no vitals taken for this visit.  No physical examination was performed in lieu of virtual telephone clinic visit.   Imaging: Mapping study 02/28/22     Post Seg VIII TARE (03/14/22)    MR Abdomen 07/18/22  Post-TARE changes in left lobe and segment VIII, no focal enhancing lesions - LIRADS TR-non viable.    Labs:  CBC: Recent Labs    10/25/21 0848 02/28/22 0822 03/14/22 0800 04/20/22 1103 05/06/22 1002  WBC 8.1 4.5 5.6 4.7  --   HGB 15.0 12.1* 13.5 12.5* 15.6  HCT 44.0 35.8* 39.8 36.7* 46.0  PLT 113* 130* 128* 108*  --     COAGS: Recent Labs    10/11/21 0743 10/25/21 0848 02/28/22 0822  INR 1.1 1.1 1.1    BMP: Recent Labs    10/25/21 0848 02/28/22 0822 03/14/22 0800 04/20/22 1103 05/06/22 1002  NA 137 138 137 137 140  K 3.9 3.9 4.1 3.8 3.9  CL 105 107 108 103 106  CO2 22 23 21* 26  --   GLUCOSE 135* 178* 140* 146* 144*  BUN 14 16 27* 9 10  CALCIUM 9.2 9.0 9.5 9.2  --   CREATININE 0.78 0.90 1.25* 0.78 0.60*  GFRNONAA >60 >60 >60 >60  --     LIVER FUNCTION TESTS: Recent Labs    10/11/21 0743 10/25/21 0848 02/28/22 0822 03/14/22 0800  BILITOT 0.6 0.7 1.1 0.9  AST 41 48* 149* 77*  ALT 46* 43 68* 69*  ALKPHOS 116 114 140* 146*  PROT 6.8 7.8 6.3* 7.8  ALBUMIN 3.4* 4.0 3.0* 3.9   A-FP 06/11/21: 12.4 10/11/21: 8.0 07/02/22: 7.5   Assessment and Plan: 56 year old male with history of alcoholic cirrhosis (Child Pugh A, ALBI 2) and multifocal, bilobar hepatocellular carcinoma now status post left lobar hepatic  transarterial radioembolization on 10/25/21 and segment VIII radiation segmentectomy on 03/14/22.  Overall he is recovering well after the procedures.     -plan for MRI abdomen in February 2024  to assess treatment response -CBC, CMP, INR, a-FP to be drawn at that time -follow up with IR clinic visit shortly thereafter to review results  Electronically Signed: Rosanne Ashing Merlina Marchena 08/05/2022, 9:09 AM   I spent a total of 25 Minutes in virtual telephone clinical consultation, greater than 50% of which was counseling/coordinating care for hepatocellular carcinoma.

## 2022-08-08 ENCOUNTER — Other Ambulatory Visit: Payer: Self-pay | Admitting: Orthopedic Surgery

## 2022-08-08 DIAGNOSIS — T8189XD Other complications of procedures, not elsewhere classified, subsequent encounter: Secondary | ICD-10-CM

## 2022-08-08 MED ORDER — OXYCODONE-ACETAMINOPHEN 10-325 MG PO TABS: 1.0000 | ORAL_TABLET | ORAL | 0 refills | Status: DC | PRN

## 2022-08-12 ENCOUNTER — Other Ambulatory Visit: Payer: Self-pay | Admitting: Orthopedic Surgery

## 2022-08-12 DIAGNOSIS — T8189XD Other complications of procedures, not elsewhere classified, subsequent encounter: Secondary | ICD-10-CM

## 2022-08-13 ENCOUNTER — Other Ambulatory Visit: Payer: Self-pay | Admitting: Orthopedic Surgery

## 2022-08-13 DIAGNOSIS — T8189XD Other complications of procedures, not elsewhere classified, subsequent encounter: Secondary | ICD-10-CM

## 2022-08-13 MED ORDER — OXYCODONE-ACETAMINOPHEN 10-325 MG PO TABS: 1.0000 | ORAL_TABLET | ORAL | 0 refills | Status: DC | PRN

## 2022-08-16 ENCOUNTER — Other Ambulatory Visit: Payer: Self-pay | Admitting: Orthopedic Surgery

## 2022-08-16 DIAGNOSIS — T8189XD Other complications of procedures, not elsewhere classified, subsequent encounter: Secondary | ICD-10-CM

## 2022-08-17 ENCOUNTER — Other Ambulatory Visit: Payer: Self-pay | Admitting: Orthopedic Surgery

## 2022-08-17 DIAGNOSIS — T8189XD Other complications of procedures, not elsewhere classified, subsequent encounter: Secondary | ICD-10-CM

## 2022-08-19 MED ORDER — OXYCODONE-ACETAMINOPHEN 10-325 MG PO TABS: 1.0000 | ORAL_TABLET | ORAL | 0 refills | Status: DC | PRN

## 2022-08-21 ENCOUNTER — Encounter: Payer: Self-pay | Admitting: Internal Medicine

## 2022-08-21 ENCOUNTER — Ambulatory Visit (INDEPENDENT_AMBULATORY_CARE_PROVIDER_SITE_OTHER): Payer: Medicaid Other | Admitting: Internal Medicine

## 2022-08-21 ENCOUNTER — Telehealth: Payer: Self-pay | Admitting: *Deleted

## 2022-08-21 VITALS — BP 118/71 | HR 73 | Temp 97.9°F | Ht 69.0 in | Wt 161.8 lb

## 2022-08-21 DIAGNOSIS — C220 Liver cell carcinoma: Secondary | ICD-10-CM

## 2022-08-21 DIAGNOSIS — Z1211 Encounter for screening for malignant neoplasm of colon: Secondary | ICD-10-CM | POA: Diagnosis not present

## 2022-08-21 DIAGNOSIS — K7460 Unspecified cirrhosis of liver: Secondary | ICD-10-CM

## 2022-08-21 NOTE — Telephone Encounter (Signed)
08/21/22  Sharyn Dross [birth year]  What type of surgery is being performed? EGD  When is surgery scheduled? TBD  What type of clearance is required (medical or pharmacy to hold medication or both? Medication  Are there any medications that need to be held prior to surgery and how long? Plavix x 5 days  Name of physician performing surgery?  Dr. Maylon Peppers [facility] Phone: [phone] Fax: [phone]  Anethesia type (none, local, MAC, general)? MAC

## 2022-08-21 NOTE — Progress Notes (Signed)
Referring Provider: Denyce Robert, FNP Primary Care Physician:  Denyce Robert, FNP Primary GI:  Dr. Abbey Chatters  Chief Complaint  Patient presents with   Cirrhosis    Follow up on cirrhosis. Having some pain on lower right side.     HPI:   Danny [name] is a 56 y.o. male who presents to the clinic today for to discuss upper endoscopy for variceal screening.  History of cirrhosis compensated, chronic hepatitis C, hepatocellular carcinoma status post embolization.  Follows with [facility] in [place] for cirrhosis care.  No previous EGD for variceal screening.  Denies any melena, nausea vomiting, hematochezia.  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  No previous colonoscopy.  No family history of colorectal malignancy.  No unintentional weight loss.  No abdominal pain.  History of peripheral vascular disease status post angiography with SFA stent placement August 2023.  Currently on Plavix, aspirin, statin.  Past Medical History:  Diagnosis Date   Alcohol abuse    Anxiety    Back pain    CVA (cerebral infarction)    Depression    Diabetes mellitus    Hypertension    Migraines    Stroke [facility]     Past Surgical History:  Procedure Laterality Date   ABDOMINAL AORTOGRAM W/LOWER EXTREMITY Right 05/06/2022   Procedure: ABDOMINAL AORTOGRAM W/LOWER EXTREMITY;  Surgeon: Waynetta Sandy, MD;  Location: [facility];  Service: Cardiovascular;  Laterality: Right;   CAST APPLICATION Right 5/91/6384   Procedure: CAST APPLICATION;  Surgeon: Carole Civil, MD;  Location: [facility];  Service: Orthopedics;  Laterality: Right;   FOOT SURGERY     INCISION AND DRAINAGE Right 11/02/2021   Procedure: INCISION AND DRAINAGE RIGHT KNEE;  Surgeon: Carole Civil, MD;  Location: [facility];  Service: Orthopedics;  Laterality: Right;   IR 3D INDEPENDENT WKST  10/11/2021   IR 3D INDEPENDENT WKST  02/28/2022   IR 3D INDEPENDENT WKST  03/14/2022   IR ANGIOGRAM SELECTIVE EACH  ADDITIONAL VESSEL  10/11/2021   IR ANGIOGRAM SELECTIVE EACH ADDITIONAL VESSEL  10/11/2021   IR ANGIOGRAM SELECTIVE EACH ADDITIONAL VESSEL  10/11/2021   IR ANGIOGRAM SELECTIVE EACH ADDITIONAL VESSEL  10/11/2021   IR ANGIOGRAM SELECTIVE EACH ADDITIONAL VESSEL  10/11/2021   IR ANGIOGRAM SELECTIVE EACH ADDITIONAL VESSEL  10/11/2021   IR ANGIOGRAM SELECTIVE EACH ADDITIONAL VESSEL  10/25/2021   IR ANGIOGRAM SELECTIVE EACH ADDITIONAL VESSEL  10/25/2021   IR ANGIOGRAM SELECTIVE EACH ADDITIONAL VESSEL  02/28/2022   IR ANGIOGRAM SELECTIVE EACH ADDITIONAL VESSEL  02/28/2022   IR ANGIOGRAM SELECTIVE EACH ADDITIONAL VESSEL  02/28/2022   IR ANGIOGRAM SELECTIVE EACH ADDITIONAL VESSEL  03/14/2022   IR ANGIOGRAM VISCERAL SELECTIVE  10/11/2021   IR ANGIOGRAM VISCERAL SELECTIVE  02/28/2022   IR ANGIOGRAM VISCERAL SELECTIVE  03/14/2022   IR EMBO TUMOR ORGAN ISCHEMIA INFARCT INC GUIDE ROADMAPPING  10/25/2021   IR EMBO TUMOR ORGAN ISCHEMIA INFARCT INC GUIDE ROADMAPPING  10/25/2021   IR EMBO TUMOR ORGAN ISCHEMIA INFARCT INC GUIDE ROADMAPPING  03/14/2022   IR RADIOLOGIST EVAL & MGMT  08/23/2021   IR RADIOLOGIST EVAL & MGMT  11/21/2021   IR RADIOLOGIST EVAL & MGMT  01/24/2022   IR RADIOLOGIST EVAL & MGMT  04/09/2022   IR RADIOLOGIST EVAL & MGMT  08/05/2022   IR US GUIDE VASC ACCESS RIGHT  10/11/2021   IR US GUIDE VASC ACCESS RIGHT  10/25/2021   IR US GUIDE [place]alier RIGHT  02/28/2022   IR US  GUIDE VASC ACCESS RIGHT  03/14/2022   MANDIBLE SURGERY     PERIPHERAL VASCULAR INTERVENTION Right 05/06/2022   Procedure: PERIPHERAL VASCULAR INTERVENTION;  Surgeon: Waynetta Sandy, MD;  Location: [facility];  Service: Cardiovascular;  Laterality: Right;  SFA   QUADRICEPS TENDON REPAIR Right 09/04/2021   Procedure: REPAIR QUADRICEP TENDON;  Surgeon: Carole Civil, MD;  Location: [facility];  Service: Orthopedics;  Laterality: Right;   TENDON EXPLORATION Right 11/02/2021   Procedure: QUADRICEPS TENDON AND PATELLAR TENDON EXPLORATION;   Surgeon: Carole Civil, MD;  Location: [facility];  Service: Orthopedics;  Laterality: Right;    Current Outpatient Medications  Medication Sig Dispense Refill   albuterol (VENTOLIN HFA) 108 (90 Base) MCG/ACT inhaler Inhale 2 puffs into the lungs every 6 (six) hours as needed for wheezing or shortness of breath.     aspirin EC 81 MG tablet Take 1 tablet (81 mg total) by mouth daily. Swallow whole. 150 tablet 2   Cholecalciferol (VITAMIN D-3) 125 MCG (5000 UT) TABS Take 5,000 Units by mouth daily.     citalopram (CELEXA) 20 MG tablet Take 20 mg by mouth daily.     clopidogrel (PLAVIX) 75 MG tablet Take 1 tablet (75 mg total) by mouth daily. 30 tablet 11   gabapentin (NEURONTIN) 600 MG tablet Take 600 mg by mouth in the morning and at bedtime.     Melatonin 10 MG CAPS Take 10 mg by mouth at bedtime.     metFORMIN (GLUCOPHAGE) 500 MG tablet Take 1 tablet (500 mg total) by mouth 2 (two) times daily with a meal. 60 tablet 2   naloxone (NARCAN) nasal spray 4 mg/0.1 mL Place 1 spray into the nose once.     oxyCODONE-acetaminophen (PERCOCET) 10-325 MG tablet Take 1 tablet by mouth every 4 (four) hours as needed for pain. 30 tablet 0   pantoprazole (PROTONIX) 40 MG tablet Take 1 tablet (40 mg total) by mouth daily. 30 tablet 2   QUEtiapine (SEROQUEL) 100 MG tablet Take 100 mg by mouth at bedtime.     rosuvastatin (CRESTOR) 10 MG tablet Take 1 tablet (10 mg total) by mouth daily. 30 tablet 11   No current facility-administered medications for this visit.    Allergies as of 08/21/2022 - Review Complete 08/21/2022  Allergen Reaction Noted   Ultram [tramadol] Hives 09/10/2013   Benadryl [diphenhydramine] Itching and Rash 04/28/2014    Family History  Problem Relation Age of Onset   Heart failure Mother    Stroke Father    Stroke Sister    Cancer Sister        breast cancer   Colon cancer Neg Hx    Liver cancer Neg Hx     Social History   Socioeconomic History   Marital status: Divorced     Spouse name: Not on file   Number of children: Not on file   Years of education: Not on file   Highest education level: Not on file  Occupational History   Not on file  Tobacco Use   Smoking status: Every Day    Packs/day: 0.50    Years: 20.00    Total pack years: 10.00    Types: Cigarettes    Passive exposure: Current   Smokeless tobacco: Never  Vaping Use   Vaping Use: Never used  Substance and Sexual Activity   Alcohol use: Not Currently    Comment: Quit 11 months ago. Used to drink about 6 pack a day. (  documented 06/06/21)   Drug use: Yes    Types: Marijuana    Comment: occas   Sexual activity: Yes    Birth control/protection: None  Other Topics Concern   Not on file  Social History Narrative   Not on file   Social Determinants of Health   Financial Resource Strain: Not on file  Food Insecurity: Not on file  Transportation Needs: Not on file  Physical Activity: Not on file  Stress: Not on file  Social Connections: Not on file    Subjective: Review of Systems  Constitutional:  Negative for chills and fever.  HENT:  Negative for congestion and hearing loss.   Eyes:  Negative for blurred vision and double vision.  Respiratory:  Negative for cough and shortness of breath.   Cardiovascular:  Negative for chest pain and palpitations.  Gastrointestinal:  Negative for abdominal pain, blood in stool, constipation, diarrhea, heartburn, melena and vomiting.  Genitourinary:  Negative for dysuria and urgency.  Musculoskeletal:  Negative for joint pain and myalgias.  Skin:  Negative for itching and rash.  Neurological:  Negative for dizziness and headaches.  Psychiatric/Behavioral:  Negative for depression. The patient is not nervous/anxious.      Objective: BP 118/71 (BP Location: Left Arm, Patient Position: Sitting, Cuff Size: Normal)   Pulse 73   Temp 97.9 F (36.6 C) (Oral)   Ht '5\' 9"'$  (1.753 m)   Wt 161 lb 12.8 oz (73.4 kg)   BMI 23.89 kg/m  Physical  Exam Constitutional:      Appearance: Normal appearance.  HENT:     Head: Normocephalic and atraumatic.  Eyes:     Extraocular Movements: Extraocular movements intact.     Conjunctiva/sclera: Conjunctivae normal.  Cardiovascular:     Rate and Rhythm: Normal rate and regular rhythm.  Pulmonary:     Effort: Pulmonary effort is normal.     Breath sounds: Normal breath sounds.  Abdominal:     General: Bowel sounds are normal.     Palpations: Abdomen is soft.  Musculoskeletal:        General: Normal range of motion.     Cervical back: Normal range of motion and neck supple.  Skin:    General: Skin is warm.  Neurological:     General: No focal deficit present.     Mental Status: He is alert and oriented to person, place, and time.  Psychiatric:        Mood and Affect: Mood normal.        Behavior: Behavior normal.      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  Currently on Plavix.  We will need to reach out to his vascular surgeon for clearance to hold this x 5 days prior to EGD.  Discussed esophageal varices in depth with patient today.  Discussed potential treatments including banding as well as beta-blocker therapy.  The risks including infection, bleed, or perforation as well as benefits, limitations, alternatives and imponderables have been reviewed with the patient. Potential for esophageal dilation, biopsy, etc. have also been reviewed.  Questions have been answered. All parties agreeable.  Discussed colonoscopy as well as he needs 1 for colon cancer screening.  He is quite averse to having this done.  I did counsel him that if transplant is to be considered in the future, he will need 1 as part of his workup.  He would like  to hold off for now.  Continue to follow-up with [facility] for cirrhosis management.  Appreciate her help immensely  with this patient.  08/21/2022 11:24 AM   Disclaimer: This note was dictated with voice recognition software. Similar sounding words can inadvertently be transcribed and may not be corrected upon review.

## 2022-08-21 NOTE — Patient Instructions (Signed)
We need to schedule you for upper endoscopy for variceal screening.  We will need to reach out to your vascular surgeon prior however to ensure that it is okay to hold your Plavix x 5 days before procedure.  Continue to follow-up with [facility] in [place] for cirrhosis care.  We will call you once we have heard from the vascular surgeon to set up your procedure.  It was very nice seeing you today.  Dr. Abbey Chatters

## 2022-08-22 ENCOUNTER — Other Ambulatory Visit: Payer: Self-pay | Admitting: Orthopedic Surgery

## 2022-08-22 DIAGNOSIS — T8189XD Other complications of procedures, not elsewhere classified, subsequent encounter: Secondary | ICD-10-CM

## 2022-08-22 MED ORDER — OXYCODONE-ACETAMINOPHEN 10-325 MG PO TABS: 1.0000 | ORAL_TABLET | ORAL | 0 refills | Status: DC | PRN

## 2022-08-26 NOTE — Telephone Encounter (Signed)
Ok to schedule hold plavix, continue asa

## 2022-08-27 ENCOUNTER — Encounter (INDEPENDENT_AMBULATORY_CARE_PROVIDER_SITE_OTHER): Payer: Self-pay | Admitting: *Deleted

## 2022-08-27 NOTE — Telephone Encounter (Signed)
Spoke with pt niece (on dpr). Pt scheduled for 1/8 at 12:30pm. Aware will send instructions/pre-op via mychart.

## 2022-08-28 ENCOUNTER — Other Ambulatory Visit: Payer: Self-pay | Admitting: Orthopedic Surgery

## 2022-08-28 DIAGNOSIS — T8189XD Other complications of procedures, not elsewhere classified, subsequent encounter: Secondary | ICD-10-CM

## 2022-08-28 MED ORDER — OXYCODONE-ACETAMINOPHEN 10-325 MG PO TABS: 1.0000 | ORAL_TABLET | ORAL | 0 refills | Status: DC | PRN

## 2022-09-03 ENCOUNTER — Other Ambulatory Visit: Payer: Self-pay | Admitting: Orthopedic Surgery

## 2022-09-03 DIAGNOSIS — T8189XD Other complications of procedures, not elsewhere classified, subsequent encounter: Secondary | ICD-10-CM

## 2022-09-04 MED ORDER — OXYCODONE-ACETAMINOPHEN 10-325 MG PO TABS: 1.0000 | ORAL_TABLET | ORAL | 0 refills | Status: DC | PRN

## 2022-09-09 ENCOUNTER — Other Ambulatory Visit: Payer: Self-pay | Admitting: Orthopedic Surgery

## 2022-09-09 DIAGNOSIS — T8189XD Other complications of procedures, not elsewhere classified, subsequent encounter: Secondary | ICD-10-CM

## 2022-09-10 MED ORDER — OXYCODONE-ACETAMINOPHEN 10-325 MG PO TABS: 1.0000 | ORAL_TABLET | ORAL | 0 refills | Status: DC | PRN

## 2022-09-10 NOTE — Patient Instructions (Signed)
Danny [name]  09/10/2022     '@PREFPERIOPPHARMACY'$ @   Your procedure is scheduled on  09/16/2022.   Report to [facility] at  1030  A.M.   Call this number if you have problems the morning of surgery:  [phone]  If you experience any cold or flu symptoms such as cough, fever, chills, shortness of breath, etc. between now and your scheduled surgery, please notify us at the above number.   Remember:  Follow the diet and prep instructions given to you by the office.      Your last dose of plavix should be on 09/10/2021.     DO NOT take any medications for diabetes the morning of your procedure.     Take these medicines the morning of surgery with A SIP OF WATER     celexa, gabapentin, oxycodone(if needed), protonix.     Do not wear jewelry, make-up or nail polish.  Do not wear lotions, powders, or perfumes, or deodorant.  Do not shave 48 hours prior to surgery.  Men may shave face and neck.  Do not bring valuables to the hospital.  [facility] is not responsible for any belongings or valuables.  Contacts, dentures or bridgework may not be worn into surgery.  Leave your suitcase in the car.  After surgery it may be brought to your room.  For patients admitted to the hospital, discharge time will be determined by your treatment team.  Patients discharged the day of surgery will not be allowed to drive home and must have someone with them for 24 hours.    Special instructions:   DO NOT smoke tobacco or vape for 24 hours before your procedure.  Please read over the following fact sheets that you were given. Anesthesia Post-op Instructions and Care and Recovery After Surgery      Upper Endoscopy, Adult, Care After After the procedure, it is common to have a sore throat. It is also common to have: Mild stomach pain or discomfort. Bloating. Nausea. Follow these instructions at home: The instructions below may help you care for yourself at home. Your health care  provider may give you more instructions. If you have questions, ask your health care provider. If you were given a sedative during the procedure, it can affect you for several hours. Do not drive or operate machinery until your health care provider says that it is safe. If you will be going home right after the procedure, plan to have a responsible adult: Take you home from the hospital or clinic. You will not be allowed to drive. Care for you for the time you are told. Follow instructions from your health care provider about what you may eat and drink. Return to your normal activities as told by your health care provider. Ask your health care provider what activities are safe for you. Take over-the-counter and prescription medicines only as told by your health care provider. Contact a health care provider if you: Have a sore throat that lasts longer than one day. Have trouble swallowing. Have a fever. Get help right away if you: Vomit blood or your vomit looks like coffee grounds. Have bloody, black, or tarry stools. Have a very bad sore throat or you cannot swallow. Have difficulty breathing or very bad pain in your chest or abdomen. These symptoms may be an emergency. Get help right away. Call 911. Do not wait to see if the symptoms will go away. Do not drive  yourself to the hospital. Summary After the procedure, it is common to have a sore throat, mild stomach discomfort, bloating, and nausea. If you were given a sedative during the procedure, it can affect you for several hours. Do not drive until your health care provider says that it is safe. Follow instructions from your health care provider about what you may eat and drink. Return to your normal activities as told by your health care provider. This information is not intended to replace advice given to you by your health care provider. Make sure you discuss any questions you have with your health care provider. Document Revised:  12/05/2021 Document Reviewed: 12/05/2021 Elsevier Patient Education  [place] After The following information offers guidance on how to care for yourself after your procedure. Your health care provider may also give you more specific instructions. If you have problems or questions, contact your health care provider. What can I expect after the procedure? After the procedure, it is common to have: Tiredness. Little or no memory about what happened during or after the procedure. Impaired judgment when it comes to making decisions. Nausea or vomiting. Some trouble with balance. Follow these instructions at home: For the time period you were told by your health care provider:  Rest. Do not participate in activities where you could fall or become injured. Do not drive or use machinery. Do not drink alcohol. Do not take sleeping pills or medicines that cause drowsiness. Do not make important decisions or sign legal documents. Do not take care of children on your own. Medicines Take over-the-counter and prescription medicines only as told by your health care provider. If you were prescribed antibiotics, take them as told by your health care provider. Do not stop using the antibiotic even if you start to feel better. Eating and drinking Follow instructions from your health care provider about what you may eat and drink. Drink enough fluid to keep your urine pale yellow. If you vomit: Drink clear fluids slowly and in small amounts as you are able. Clear fluids include water, ice chips, low-calorie sports drinks, and fruit juice that has water added to it (diluted fruit juice). Eat light and bland foods in small amounts as you are able. These foods include bananas, applesauce, rice, lean meats, toast, and crackers. General instructions  Have a responsible adult stay with you for the time you are told. It is important to have someone help care for you  until you are awake and alert. If you have sleep apnea, surgery and some medicines can increase your risk for breathing problems. Follow instructions from your health care provider about wearing your sleep device: When you are sleeping. This includes during daytime naps. While taking prescription pain medicines, sleeping medicines, or medicines that make you drowsy. Do not use any products that contain nicotine or tobacco. These products include cigarettes, chewing tobacco, and vaping devices, such as e-cigarettes. If you need help quitting, ask your health care provider. Contact a health care provider if: You feel nauseous or vomit every time you eat or drink. You feel light-headed. You are still sleepy or having trouble with balance after 24 hours. You get a rash. You have a fever. You have redness or swelling around the IV site. Get help right away if: You have trouble breathing. You have new confusion after you get home. These symptoms may be an emergency. Get help right away. Call 911. Do not wait to see if the symptoms  will go away. Do not drive yourself to the hospital. This information is not intended to replace advice given to you by your health care provider. Make sure you discuss any questions you have with your health care provider. Document Revised: 01/21/2022 Document Reviewed: 01/21/2022 Elsevier Patient Education  [place].

## 2022-09-11 ENCOUNTER — Encounter (HOSPITAL_COMMUNITY)
Admission: RE | Admit: 2022-09-11 | Discharge: 2022-09-11 | Disposition: A | Discharge status: Home / Self Care | Payer: Medicaid Other | Source: Ambulatory Visit | Attending: Internal Medicine | Admitting: Internal Medicine

## 2022-09-11 ENCOUNTER — Encounter (HOSPITAL_COMMUNITY): Payer: Self-pay

## 2022-09-11 DIAGNOSIS — R030 Elevated blood-pressure reading, without diagnosis of hypertension: Secondary | ICD-10-CM

## 2022-09-11 DIAGNOSIS — B182 Chronic viral hepatitis C: Secondary | ICD-10-CM

## 2022-09-11 DIAGNOSIS — I159 Secondary hypertension, unspecified: Secondary | ICD-10-CM

## 2022-09-11 DIAGNOSIS — E119 Type 2 diabetes mellitus without complications: Secondary | ICD-10-CM

## 2022-09-11 DIAGNOSIS — K7469 Other cirrhosis of liver: Secondary | ICD-10-CM

## 2022-09-11 NOTE — Pre-Procedure Instructions (Signed)
Mindy and Tammy messaged because patient did not show for his pre-op.

## 2022-09-12 ENCOUNTER — Telehealth (INDEPENDENT_AMBULATORY_CARE_PROVIDER_SITE_OTHER): Payer: Self-pay | Admitting: *Deleted

## 2022-09-12 NOTE — Telephone Encounter (Signed)
Danny [name]  [name], CMA PAT tomorrow at [employer] niece and made aware. She voiced understanding

## 2022-09-12 NOTE — Telephone Encounter (Signed)
-----   Message from Encarnacion Chu, RN sent at 09/11/2022  3:40 PM EST ----- Regarding: no show Hey ladies. Danny [name] did not show for his pre-op today.

## 2022-09-12 NOTE — Telephone Encounter (Signed)
Called spoke with pt niece. They forgot about the pre-op but wondered if it could still be done. I called carolyn in endo and LMTCB to check

## 2022-09-13 ENCOUNTER — Encounter (HOSPITAL_COMMUNITY)
Admission: RE | Admit: 2022-09-13 | Discharge: 2022-09-13 | Disposition: A | Discharge status: Home / Self Care | Payer: Medicaid Other | Source: Ambulatory Visit | Attending: Internal Medicine | Admitting: Internal Medicine

## 2022-09-13 DIAGNOSIS — Z01818 Encounter for other preprocedural examination: Secondary | ICD-10-CM | POA: Diagnosis present

## 2022-09-13 DIAGNOSIS — I159 Secondary hypertension, unspecified: Secondary | ICD-10-CM | POA: Insufficient documentation

## 2022-09-13 DIAGNOSIS — K7469 Other cirrhosis of liver: Secondary | ICD-10-CM | POA: Insufficient documentation

## 2022-09-13 DIAGNOSIS — B182 Chronic viral hepatitis C: Secondary | ICD-10-CM | POA: Diagnosis not present

## 2022-09-13 DIAGNOSIS — R030 Elevated blood-pressure reading, without diagnosis of hypertension: Secondary | ICD-10-CM | POA: Diagnosis not present

## 2022-09-13 DIAGNOSIS — E119 Type 2 diabetes mellitus without complications: Secondary | ICD-10-CM | POA: Diagnosis not present

## 2022-09-14 ENCOUNTER — Other Ambulatory Visit: Payer: Self-pay | Admitting: Orthopedic Surgery

## 2022-09-14 DIAGNOSIS — T8189XD Other complications of procedures, not elsewhere classified, subsequent encounter: Secondary | ICD-10-CM

## 2022-09-16 ENCOUNTER — Encounter (HOSPITAL_COMMUNITY)
Admission: RE | Disposition: A | Discharge status: Home / Self Care | Payer: Self-pay | Source: Home / Self Care | Attending: Internal Medicine

## 2022-09-16 ENCOUNTER — Ambulatory Visit (HOSPITAL_COMMUNITY)
Admission: RE | Admit: 2022-09-16 | Discharge: 2022-09-16 | Disposition: A | Discharge status: Home / Self Care | Payer: Medicaid Other | Attending: Internal Medicine | Admitting: Internal Medicine

## 2022-09-16 ENCOUNTER — Encounter (HOSPITAL_COMMUNITY): Payer: Self-pay

## 2022-09-16 ENCOUNTER — Other Ambulatory Visit: Payer: Self-pay | Admitting: Orthopedic Surgery

## 2022-09-16 ENCOUNTER — Ambulatory Visit (HOSPITAL_COMMUNITY): Payer: Medicaid Other | Admitting: Certified Registered Nurse Anesthetist

## 2022-09-16 ENCOUNTER — Ambulatory Visit (HOSPITAL_BASED_OUTPATIENT_CLINIC_OR_DEPARTMENT_OTHER): Payer: Medicaid Other | Admitting: Certified Registered Nurse Anesthetist

## 2022-09-16 DIAGNOSIS — Z13810 Encounter for screening for upper gastrointestinal disorder: Secondary | ICD-10-CM | POA: Diagnosis present

## 2022-09-16 DIAGNOSIS — J449 Chronic obstructive pulmonary disease, unspecified: Secondary | ICD-10-CM | POA: Insufficient documentation

## 2022-09-16 DIAGNOSIS — K3189 Other diseases of stomach and duodenum: Secondary | ICD-10-CM | POA: Insufficient documentation

## 2022-09-16 DIAGNOSIS — F17210 Nicotine dependence, cigarettes, uncomplicated: Secondary | ICD-10-CM | POA: Diagnosis not present

## 2022-09-16 DIAGNOSIS — K766 Portal hypertension: Secondary | ICD-10-CM | POA: Diagnosis not present

## 2022-09-16 DIAGNOSIS — I10 Essential (primary) hypertension: Secondary | ICD-10-CM

## 2022-09-16 DIAGNOSIS — E1151 Type 2 diabetes mellitus with diabetic peripheral angiopathy without gangrene: Secondary | ICD-10-CM | POA: Diagnosis not present

## 2022-09-16 DIAGNOSIS — Z8619 Personal history of other infectious and parasitic diseases: Secondary | ICD-10-CM | POA: Diagnosis not present

## 2022-09-16 DIAGNOSIS — Z8673 Personal history of transient ischemic attack (TIA), and cerebral infarction without residual deficits: Secondary | ICD-10-CM | POA: Diagnosis not present

## 2022-09-16 DIAGNOSIS — Z7984 Long term (current) use of oral hypoglycemic drugs: Secondary | ICD-10-CM | POA: Insufficient documentation

## 2022-09-16 DIAGNOSIS — F419 Anxiety disorder, unspecified: Secondary | ICD-10-CM | POA: Insufficient documentation

## 2022-09-16 DIAGNOSIS — K7469 Other cirrhosis of liver: Secondary | ICD-10-CM

## 2022-09-16 DIAGNOSIS — F32A Depression, unspecified: Secondary | ICD-10-CM | POA: Diagnosis not present

## 2022-09-16 DIAGNOSIS — T8189XD Other complications of procedures, not elsewhere classified, subsequent encounter: Secondary | ICD-10-CM

## 2022-09-16 DIAGNOSIS — K7460 Unspecified cirrhosis of liver: Secondary | ICD-10-CM | POA: Insufficient documentation

## 2022-09-16 DIAGNOSIS — K219 Gastro-esophageal reflux disease without esophagitis: Secondary | ICD-10-CM | POA: Insufficient documentation

## 2022-09-16 HISTORY — PX: ESOPHAGOGASTRODUODENOSCOPY (EGD) WITH PROPOFOL: SHX5813

## 2022-09-16 LAB — COMPREHENSIVE METABOLIC PANEL
ALT: 33 U/L (ref 0–44)
AST: 50 U/L — ABNORMAL HIGH (ref 15–41)
Albumin: 3.4 g/dL — ABNORMAL LOW (ref 3.5–5.0)
Alkaline Phosphatase: 107 U/L (ref 38–126)
Anion gap: 9 (ref 5–15)
BUN: 10 mg/dL (ref 6–20)
CO2: 25 mmol/L (ref 22–32)
Calcium: 9.3 mg/dL (ref 8.9–10.3)
Chloride: 103 mmol/L (ref 98–111)
Creatinine, Ser: 0.78 mg/dL (ref 0.61–1.24)
GFR, Estimated: >60 mL/min (ref >60)
Glucose, Bld: 115 mg/dL — ABNORMAL HIGH (ref 70–99)
Potassium: 3.6 mmol/L (ref 3.5–5.1)
Sodium: 137 mmol/L (ref 135–145)
Total Bilirubin: 1.6 mg/dL — ABNORMAL HIGH (ref 0.3–1.2)
Total Protein: 7.3 g/dL (ref 6.5–8.1)

## 2022-09-16 LAB — CBC WITH DIFFERENTIAL/PLATELET
Abs Immature Granulocytes: 0.01 10*3/uL (ref 0.00–0.07)
Basophils Absolute: 0 10*3/uL (ref 0.0–0.1)
Basophils Relative: 1 %
Eosinophils Absolute: 0.2 10*3/uL (ref 0.0–0.5)
Eosinophils Relative: 6 %
HCT: 39.9 % (ref 39.0–52.0)
Hemoglobin: 13.8 g/dL (ref 13.0–17.0)
Immature Granulocytes: 0 %
Lymphocytes Relative: 20 %
Lymphs Abs: 0.8 10*3/uL (ref 0.7–4.0)
MCH: 32.2 pg (ref 26.0–34.0)
MCHC: 34.6 g/dL (ref 30.0–36.0)
MCV: 93 fL (ref 80.0–100.0)
Monocytes Absolute: 0.3 10*3/uL (ref 0.1–1.0)
Monocytes Relative: 9 %
Neutro Abs: 2.4 10*3/uL (ref 1.7–7.7)
Neutrophils Relative %: 64 %
Platelets: 104 10*3/uL — ABNORMAL LOW (ref 150–400)
RBC: 4.29 MIL/uL (ref 4.22–5.81)
RDW: 13.6 % (ref 11.5–15.5)
WBC: 3.8 10*3/uL — ABNORMAL LOW (ref 4.0–10.5)
nRBC: 0 % (ref 0.0–0.2)

## 2022-09-16 LAB — PROTIME-INR
INR: 1.2 (ref 0.8–1.2)
Prothrombin Time: 14.7 seconds (ref 11.4–15.2)

## 2022-09-16 LAB — GLUCOSE, CAPILLARY: Glucose-Capillary: 105 mg/dL — ABNORMAL HIGH (ref 70–99)

## 2022-09-16 SURGERY — ESOPHAGOGASTRODUODENOSCOPY (EGD) WITH PROPOFOL
Anesthesia: General

## 2022-09-16 MED ORDER — PROPOFOL 10 MG/ML IV BOLUS
INTRAVENOUS | Status: DC | PRN
  Administered 2022-09-16: 40 mg via INTRAVENOUS
  Administered 2022-09-16: 100 mg via INTRAVENOUS

## 2022-09-16 MED ORDER — LIDOCAINE HCL (CARDIAC) PF 100 MG/5ML IV SOSY
PREFILLED_SYRINGE | INTRAVENOUS | Status: DC | PRN
  Administered 2022-09-16: 40 mg via INTRAVENOUS

## 2022-09-16 MED ORDER — LACTATED RINGERS IV SOLN: INTRAVENOUS | Status: DC | PRN

## 2022-09-16 MED ORDER — OXYCODONE-ACETAMINOPHEN 10-325 MG PO TABS: 1.0000 | ORAL_TABLET | ORAL | 0 refills | Status: DC | PRN

## 2022-09-16 MED ORDER — LIDOCAINE HCL (PF) 2 % IJ SOLN
INTRAMUSCULAR | Status: AC
  Filled 2022-09-16: qty 10

## 2022-09-16 NOTE — H&P (Signed)
Primary Care Physician:  Denyce Robert, FNP Primary Gastroenterologist:  Dr. Abbey Chatters  Pre-Procedure History & Physical: HPI:  Danny [name] is a 57 y.o. male is here for an EGD to be performed for variceal screening/cirrhosis   Past Medical History:  Diagnosis Date   Alcohol abuse    Anxiety    Back pain    CVA (cerebral infarction)    Depression    Diabetes mellitus    Hypertension    Migraines    Stroke [facility])     Past Surgical History:  Procedure Laterality Date   ABDOMINAL AORTOGRAM W/LOWER EXTREMITY Right 05/06/2022   Procedure: ABDOMINAL AORTOGRAM W/LOWER EXTREMITY;  Surgeon: Waynetta Sandy, MD;  Location: [place] [facility];  Service: Cardiovascular;  Laterality: Right;   CAST APPLICATION Right 2/84/1324   Procedure: CAST APPLICATION;  Surgeon: Carole Civil, MD;  Location: [facility];  Service: Orthopedics;  Laterality: Right;   FOOT SURGERY     INCISION AND DRAINAGE Right 11/02/2021   Procedure: INCISION AND DRAINAGE RIGHT KNEE;  Surgeon: Carole Civil, MD;  Location: [facility];  Service: Orthopedics;  Laterality: Right;   IR 3D INDEPENDENT WKST  10/11/2021   IR 3D INDEPENDENT WKST  02/28/2022   IR 3D INDEPENDENT WKST  03/14/2022   IR ANGIOGRAM SELECTIVE EACH ADDITIONAL VESSEL  10/11/2021   IR ANGIOGRAM SELECTIVE EACH ADDITIONAL VESSEL  10/11/2021   IR ANGIOGRAM SELECTIVE EACH ADDITIONAL VESSEL  10/11/2021   IR ANGIOGRAM SELECTIVE EACH ADDITIONAL VESSEL  10/11/2021   IR ANGIOGRAM SELECTIVE EACH ADDITIONAL VESSEL  10/11/2021   IR ANGIOGRAM SELECTIVE EACH ADDITIONAL VESSEL  10/11/2021   IR ANGIOGRAM SELECTIVE EACH ADDITIONAL VESSEL  10/25/2021   IR ANGIOGRAM SELECTIVE EACH ADDITIONAL VESSEL  10/25/2021   IR ANGIOGRAM SELECTIVE EACH ADDITIONAL VESSEL  02/28/2022   IR ANGIOGRAM SELECTIVE EACH ADDITIONAL VESSEL  02/28/2022   IR ANGIOGRAM SELECTIVE EACH ADDITIONAL VESSEL  02/28/2022   IR ANGIOGRAM SELECTIVE EACH ADDITIONAL VESSEL  03/14/2022   IR ANGIOGRAM VISCERAL SELECTIVE   10/11/2021   IR ANGIOGRAM VISCERAL SELECTIVE  02/28/2022   IR ANGIOGRAM VISCERAL SELECTIVE  03/14/2022   IR EMBO TUMOR ORGAN ISCHEMIA INFARCT INC GUIDE ROADMAPPING  10/25/2021   IR EMBO TUMOR ORGAN ISCHEMIA INFARCT INC GUIDE ROADMAPPING  10/25/2021   IR EMBO TUMOR ORGAN ISCHEMIA INFARCT INC GUIDE ROADMAPPING  03/14/2022   IR RADIOLOGIST EVAL & MGMT  08/23/2021   IR RADIOLOGIST EVAL & MGMT  11/21/2021   IR RADIOLOGIST EVAL & MGMT  01/24/2022   IR RADIOLOGIST EVAL & MGMT  04/09/2022   IR RADIOLOGIST EVAL & MGMT  08/05/2022   IR US GUIDE VASC ACCESS RIGHT  10/11/2021   IR US GUIDE VASC ACCESS RIGHT  10/25/2021   IR US GUIDE VASC ACCESS RIGHT  02/28/2022   IR US GUIDE VASC ACCESS RIGHT  03/14/2022   MANDIBLE SURGERY     PERIPHERAL VASCULAR INTERVENTION Right 05/06/2022   Procedure: PERIPHERAL VASCULAR INTERVENTION;  Surgeon: Waynetta Sandy, MD;  Location: [place] [facility];  Service: Cardiovascular;  Laterality: Right;  SFA   QUADRICEPS TENDON REPAIR Right 09/04/2021   Procedure: REPAIR QUADRICEP TENDON;  Surgeon: Carole Civil, MD;  Location: [facility];  Service: Orthopedics;  Laterality: Right;   TENDON EXPLORATION Right 11/02/2021   Procedure: QUADRICEPS TENDON AND PATELLAR TENDON EXPLORATION;  Surgeon: Carole Civil, MD;  Location: [facility];  Service: Orthopedics;  Laterality: Right;    Prior to Admission medications   Medication Sig Start Date  End Date Taking? Authorizing Provider  Cholecalciferol (VITAMIN D-3) 125 MCG (5000 UT) TABS Take 5,000 Units by mouth daily.   Yes [provider]  citalopram (CELEXA) 20 MG tablet Take 20 mg by mouth daily.   Yes [provider]  clopidogrel (PLAVIX) 75 MG tablet Take 1 tablet (75 mg total) by mouth daily. 05/06/22 05/06/23 Yes Waynetta Sandy, MD  gabapentin (NEURONTIN) 600 MG tablet Take 600 mg by mouth in the morning and at bedtime. 06/24/21  Yes [provider]  Melatonin 10 MG CAPS Take 10 mg by mouth at  bedtime.   Yes [provider]  metFORMIN (GLUCOPHAGE) 500 MG tablet Take 1 tablet (500 mg total) by mouth 2 (two) times daily with a meal. 01/01/18  Yes Julianne Rice, MD  naloxone [facility]) nasal spray 4 mg/0.1 mL Place 1 spray into the nose once. 01/01/22  Yes [provider]  oxyCODONE-acetaminophen (PERCOCET) 10-325 MG tablet Take 1 tablet by mouth every 4 (four) hours as needed for pain. 09/10/22  Yes Carole Civil, MD  pantoprazole (PROTONIX) 40 MG tablet Take 1 tablet (40 mg total) by mouth daily. 05/07/22 05/07/23 Yes Dagoberto Ligas, PA-C  QUEtiapine (SEROQUEL) 100 MG tablet Take 100 mg by mouth at bedtime.   Yes [provider]  rosuvastatin (CRESTOR) 10 MG tablet Take 1 tablet (10 mg total) by mouth daily. 05/06/22 05/06/23 Yes Waynetta Sandy, MD  albuterol (VENTOLIN HFA) 108 (90 Base) MCG/ACT inhaler Inhale 2 puffs into the lungs every 6 (six) hours as needed for wheezing or shortness of breath.    [provider]  aspirin EC 81 MG tablet Take 1 tablet (81 mg total) by mouth daily. Swallow whole. 05/07/22 07/31/23  Dagoberto Ligas, PA-C    Allergies as of 08/27/2022 - Review Complete 08/21/2022  Allergen Reaction Noted   Ultram [tramadol] Hives 09/10/2013   Benadryl [diphenhydramine] Itching and Rash 04/28/2014    Family History  Problem Relation Age of Onset   Heart failure Mother    Stroke Father    Stroke Sister    Cancer Sister        breast cancer   Colon cancer Neg Hx    Liver cancer Neg Hx     Social History   Socioeconomic History   Marital status: Divorced    Spouse name: Not on file   Number of children: Not on file   Years of education: Not on file   Highest education level: Not on file  Occupational History   Not on file  Tobacco Use   Smoking status: Every Day    Packs/day: 0.50    Years: 20.00    Total pack years: 10.00    Types: Cigarettes    Passive exposure: Current   Smokeless tobacco: Never   Vaping Use   Vaping Use: Never used  Substance and Sexual Activity   Alcohol use: Not Currently    Comment: Quit 11 months ago. Used to drink about 6 pack a day. (documented 06/06/21)   Drug use: Yes    Types: Marijuana    Comment: occas   Sexual activity: Yes    Birth control/protection: None  Other Topics Concern   Not on file  Social History Narrative   Not on file   Social Determinants of Health   Financial Resource Strain: Not on file  Food Insecurity: Not on file  Transportation Needs: Not on file  Physical Activity: Not on file  Stress: Not on file  Social Connections: Not on file  Intimate Partner Violence: Not on file    Review of Systems: General: Negative for fever, chills, fatigue, weakness. Eyes: Negative for vision changes.  ENT: Negative for hoarseness, difficulty swallowing , nasal congestion. CV: Negative for chest pain, angina, palpitations, dyspnea on exertion, peripheral edema.  Respiratory: Negative for dyspnea at rest, dyspnea on exertion, cough, sputum, wheezing.  GI: See history of present illness. GU:  Negative for dysuria, hematuria, urinary incontinence, urinary frequency, nocturnal urination.  MS: Negative for joint pain, low back pain.  Derm: Negative for rash or itching.  Neuro: Negative for weakness, abnormal sensation, seizure, frequent headaches, memory loss, confusion.  Psych: Negative for anxiety, depression Endo: Negative for unusual weight change.  Heme: Negative for bruising or bleeding. Allergy: Negative for rash or hives.  Physical Exam: Vital signs in last 24 hours: Temp:  [98.2 F (36.8 C)] 98.2 F (36.8 C) (01/08 0725) Pulse Rate:  [84] 84 (01/08 0725) Resp:  [18] 18 (01/08 0725) BP: (116)/(61) 116/61 (01/08 0725) SpO2:  [97 %] 97 % (01/08 0725)   General:   Alert,  Well-developed, well-nourished, pleasant and cooperative in NAD Head:  Normocephalic and atraumatic. Eyes:  Sclera clear, no icterus.   Conjunctiva  pink. Ears:  Normal auditory acuity. Nose:  No deformity, discharge,  or lesions. Msk:  Symmetrical without gross deformities. Normal posture. Extremities:  Without clubbing or edema. Neurologic:  Alert and  oriented x4;  grossly normal neurologically. Skin:  Intact without significant lesions or rashes. Psych:  Alert and cooperative. Normal mood and affect.   Impression/Plan: Danny [name] is here for an EGD to be performed for variceal screening/cirrhosis   Risks, benefits, limitations, imponderables and alternatives regarding EGD have been reviewed with the patient. Questions have been answered. All parties agreeable.

## 2022-09-16 NOTE — Transfer of Care (Signed)
Immediate Anesthesia Transfer of Care Note  Patient: Danny [name]  Procedure(s) Performed: ESOPHAGOGASTRODUODENOSCOPY (EGD) WITH PROPOFOL  Patient Location: Short Stay  Anesthesia Type:General  Level of Consciousness: drowsy  Airway & Oxygen Therapy: Patient Spontanous Breathing  Post-op Assessment: Report given to RN and Post -op Vital signs reviewed and stable  Post vital signs: Reviewed and stable  Last Vitals:  Vitals Value Taken Time  BP 84/43 09/16/22 0820  Temp 36.5 C 09/16/22 0820  Pulse 63 09/16/22 0822  Resp 18 09/16/22 0822  SpO2 98 % 09/16/22 0822  Vitals shown include unvalidated device data.  Last Pain:  Vitals:   09/16/22 0820  TempSrc: Oral  PainSc:          Complications: No notable events documented.

## 2022-09-16 NOTE — Op Note (Signed)
[facility] Patient Name: Danny [name] Procedure Date: 09/16/2022 8:04 AM MRN: [id number] Date of Birth: September 03, 1966 Attending MD: Elon Alas. Abbey Chatters , [place], [id number] CSN: [id number] Age: 57 Admit Type: Outpatient Procedure:                Upper GI endoscopy Indications:              Screening procedure, Cirrhosis rule out esophageal                            varices Providers:                Elon Alas. Abbey Chatters, DO, Caprice Kluver, Everardo [place] Referring MD:              Medicines:                See the Anesthesia note for documentation of the                            administered medications Complications:            No immediate complications. Estimated Blood Loss:     Estimated blood loss: none. Procedure:                Pre-Anesthesia Assessment:                           - The anesthesia plan was to use monitored                            anesthesia care (MAC).                           After obtaining informed consent, the endoscope was                            passed under direct vision. Throughout the                            procedure, the patient's blood pressure, pulse, and                            oxygen saturations were monitored continuously. The                            GIF-H190 ([id number]) scope was introduced through the                            mouth, and advanced to the second part of duodenum.                            The upper GI endoscopy was accomplished without                            difficulty. The patient tolerated the procedure                            well. Scope In:  8:13:40 AM Scope Out: 8:16:29 AM Total Procedure Duration: 0 hours 2 minutes 49 seconds  Findings:      There is no endoscopic evidence of varices in the entire esophagus.      The Z-line was regular and was found 39 cm from the incisors.      Mild portal hypertensive gastropathy was found in the gastric body.      There is no endoscopic evidence of varices in the  entire examined       stomach.      The duodenal bulb, first portion of the duodenum and second portion of       the duodenum were normal. Impression:               - Z-line regular, 39 cm from the incisors.                           - Portal hypertensive gastropathy.                           - Normal duodenal bulb, first portion of the                            duodenum and second portion of the duodenum.                           - No specimens collected. Moderate Sedation:      Per Anesthesia Care Recommendation:           - Patient has a contact number available for                            emergencies. The signs and symptoms of potential                            delayed complications were discussed with the                            patient. Return to normal activities tomorrow.                            Written discharge instructions were provided to the                            patient.                           - Resume previous diet.                           - Continue present medications.                           - Repeat upper endoscopy in 3 years for screening                            purposes or sooner if decompensating event.                           -  Follow up with [facility] for cirrhosis                            care. Procedure Code(s):        --- Professional ---                           [phone], Esophagogastroduodenoscopy, flexible,                            transoral; diagnostic, including collection of                            specimen(s) by brushing or washing, when performed                            (separate procedure) Diagnosis Code(s):        --- Professional ---                           K76.6, Portal hypertension                           K31.89, Other diseases of stomach and duodenum                           Z13.810, Encounter for screening for upper                            gastrointestinal disorder                            K74.60, Unspecified cirrhosis of liver CPT copyright 2022 American Medical Association. All rights reserved. The codes documented in this report are preliminary and upon coder review may  be revised to meet current compliance requirements. Elon Alas. Abbey Chatters, DO [place] Abbey Chatters, DO 09/16/2022 8:21:20 AM This report has been signed electronically. Number of Addenda: 0

## 2022-09-16 NOTE — Anesthesia Postprocedure Evaluation (Signed)
Anesthesia Post Note  Patient: Danny [name]  Procedure(s) Performed: ESOPHAGOGASTRODUODENOSCOPY (EGD) WITH PROPOFOL  Patient location during evaluation: Phase II Anesthesia Type: General Level of consciousness: awake Pain management: pain level controlled Vital Signs Assessment: post-procedure vital signs reviewed and stable Respiratory status: spontaneous breathing and respiratory function stable Cardiovascular status: blood pressure returned to baseline and stable Postop Assessment: no headache and no apparent nausea or vomiting Anesthetic complications: no Comments: Late entry   No notable events documented.   Last Vitals:  Vitals:   09/16/22 0830 09/16/22 0835  BP: (!) 106/54   Pulse: (!) 120 66  Resp: 15 13  Temp:    SpO2: 97% 99%    Last Pain:  Vitals:   09/16/22 0820  TempSrc: Oral  PainSc:                  Louann Sjogren

## 2022-09-16 NOTE — Anesthesia Preprocedure Evaluation (Signed)
Anesthesia Evaluation  Patient identified by MRN, date of birth, ID band Patient awake    Reviewed: Allergy & Precautions, H&P , NPO status , Patient's Chart, lab work & pertinent test results, reviewed documented beta blocker date and time   Airway Mallampati: II  TM Distance: >3 FB Neck ROM: full    Dental no notable dental hx.    Pulmonary neg pulmonary ROS, COPD, Current Smoker   Pulmonary exam normal breath sounds clear to auscultation       Cardiovascular Exercise Tolerance: Good hypertension, + Peripheral Vascular Disease  negative cardio ROS  Rhythm:regular Rate:Normal     Neuro/Psych  Headaches PSYCHIATRIC DISORDERS Anxiety Depression    CVA negative neurological ROS  negative psych ROS   GI/Hepatic negative GI ROS, Neg liver ROS,GERD  ,,(+) Hepatitis -  Endo/Other  negative endocrine ROSdiabetes, Type 2    Renal/GU negative Renal ROS  negative genitourinary   Musculoskeletal   Abdominal   Peds  Hematology negative hematology ROS (+)   Anesthesia Other Findings   Reproductive/Obstetrics negative OB ROS                             Anesthesia Physical Anesthesia Plan  ASA: 3  Anesthesia Plan: General   Post-op Pain Management:    Induction:   PONV Risk Score and Plan: Propofol infusion  Airway Management Planned:   Additional Equipment:   Intra-op Plan:   Post-operative Plan:   Informed Consent: I have reviewed the patients History and Physical, chart, labs and discussed the procedure including the risks, benefits and alternatives for the proposed anesthesia with the patient or authorized representative who has indicated his/her understanding and acceptance.     Dental Advisory Given  Plan Discussed with: CRNA  Anesthesia Plan Comments:        Anesthesia Quick Evaluation

## 2022-09-16 NOTE — Discharge Instructions (Signed)
EGD Discharge instructions Please read the instructions outlined below and refer to this sheet in the next few weeks. These discharge instructions provide you with general information on caring for yourself after you leave the hospital. Your doctor may also give you specific instructions. While your treatment has been planned according to the most current medical practices available, unavoidable complications occasionally occur. If you have any problems or questions after discharge, please call your doctor. ACTIVITY You may resume your regular activity but move at a slower pace for the next 24 hours.  Take frequent rest periods for the next 24 hours.  Walking will help expel (get rid of) the air and reduce the bloated feeling in your abdomen.  No driving for 24 hours (because of the anesthesia (medicine) used during the test).  You may shower.  Do not sign any important legal documents or operate any machinery for 24 hours (because of the anesthesia used during the test).  NUTRITION Drink plenty of fluids.  You may resume your normal diet.  Begin with a light meal and progress to your normal diet.  Avoid alcoholic beverages for 24 hours or as instructed by your caregiver.  MEDICATIONS You may resume your normal medications unless your caregiver tells you otherwise.  WHAT YOU CAN EXPECT TODAY You may experience abdominal discomfort such as a feeling of fullness or "gas" pains.  FOLLOW-UP Your doctor will discuss the results of your test with you.  SEEK IMMEDIATE MEDICAL ATTENTION IF ANY OF THE FOLLOWING OCCUR: Excessive nausea (feeling sick to your stomach) and/or vomiting.  Severe abdominal pain and distention (swelling).  Trouble swallowing.  Temperature over 101 F (37.8 C).  Rectal bleeding or vomiting of blood.   Overall, your upper endoscopy looked good.  No evidence of esophageal varices.  Mild amount of inflammation in your stomach related to chronic liver disease.  Small bowel  appeared normal.  Repeat EGD in 3 years for screening purposes.  Continue to follow-up with [facility].   I hope you have a great rest of your week!  Elon Alas. Abbey Chatters, D.O. Gastroenterology and Hepatology [facility]

## 2022-09-19 ENCOUNTER — Ambulatory Visit (INDEPENDENT_AMBULATORY_CARE_PROVIDER_SITE_OTHER)
Admission: RE | Admit: 2022-09-19 | Discharge: 2022-09-19 | Disposition: A | Discharge status: Home / Self Care | Payer: Medicaid Other | Source: Ambulatory Visit | Attending: Vascular Surgery | Admitting: Vascular Surgery

## 2022-09-19 ENCOUNTER — Ambulatory Visit (HOSPITAL_COMMUNITY)
Admission: RE | Admit: 2022-09-19 | Discharge: 2022-09-19 | Disposition: A | Discharge status: Home / Self Care | Payer: Medicaid Other | Source: Ambulatory Visit | Attending: Vascular Surgery | Admitting: Vascular Surgery

## 2022-09-19 ENCOUNTER — Ambulatory Visit: Payer: Medicaid Other

## 2022-09-19 ENCOUNTER — Other Ambulatory Visit: Payer: Self-pay | Admitting: Orthopedic Surgery

## 2022-09-19 DIAGNOSIS — I7025 Atherosclerosis of native arteries of other extremities with ulceration: Secondary | ICD-10-CM | POA: Insufficient documentation

## 2022-09-19 DIAGNOSIS — T8189XD Other complications of procedures, not elsewhere classified, subsequent encounter: Secondary | ICD-10-CM

## 2022-09-19 DIAGNOSIS — I70235 Atherosclerosis of native arteries of right leg with ulceration of other part of foot: Secondary | ICD-10-CM

## 2022-09-19 DIAGNOSIS — I739 Peripheral vascular disease, unspecified: Secondary | ICD-10-CM | POA: Insufficient documentation

## 2022-09-19 LAB — VAS US ABI WITH/WO TBI
Left ABI: 1.18
Right ABI: 1.21

## 2022-09-20 ENCOUNTER — Encounter (HOSPITAL_COMMUNITY): Payer: Self-pay | Admitting: Internal Medicine

## 2022-09-20 MED ORDER — OXYCODONE-ACETAMINOPHEN 10-325 MG PO TABS: 1.0000 | ORAL_TABLET | ORAL | 0 refills | Status: DC | PRN

## 2022-09-24 ENCOUNTER — Other Ambulatory Visit: Payer: Self-pay | Admitting: Orthopedic Surgery

## 2022-09-24 DIAGNOSIS — T8189XD Other complications of procedures, not elsewhere classified, subsequent encounter: Secondary | ICD-10-CM

## 2022-09-24 MED ORDER — OXYCODONE-ACETAMINOPHEN 10-325 MG PO TABS: 1.0000 | ORAL_TABLET | ORAL | 0 refills | Status: DC | PRN

## 2022-09-28 ENCOUNTER — Other Ambulatory Visit: Payer: Self-pay | Admitting: Orthopedic Surgery

## 2022-09-28 DIAGNOSIS — T8189XD Other complications of procedures, not elsewhere classified, subsequent encounter: Secondary | ICD-10-CM

## 2022-09-30 MED ORDER — OXYCODONE-ACETAMINOPHEN 10-325 MG PO TABS: 1.0000 | ORAL_TABLET | ORAL | 0 refills | Status: DC | PRN

## 2022-10-02 ENCOUNTER — Ambulatory Visit (INDEPENDENT_AMBULATORY_CARE_PROVIDER_SITE_OTHER): Payer: Medicaid Other | Admitting: Physician Assistant

## 2022-10-02 VITALS — BP 104/58 | HR 80 | Temp 98.0°F | Ht 69.0 in | Wt 161.0 lb

## 2022-10-02 DIAGNOSIS — I739 Peripheral vascular disease, unspecified: Secondary | ICD-10-CM | POA: Diagnosis not present

## 2022-10-02 NOTE — Progress Notes (Signed)
[facility] [place] HISTORY AND PHYSICAL   History of Present Illness:  Patient is a 57 y.o. year old male who presents for evaluation of claudication.    S/P SFA stenting of the right LE 05/06/22 for limb threatening ischemia with toe ulceration. The angiogram demonstrated  three-vessel runoff to the ankle posterior tibial giving rise to the arch in the foot and filling distally.    He states that after the stent was placed he healed the wounds and has not had claudication, rest pain or non healing wounds.  He continues to take his aspirin, Plavix, statin daily. He continues to be a daily tobacco user.     Past Medical History:  Diagnosis Date   Alcohol abuse    Anxiety    Back pain    CVA (cerebral infarction)    Depression    Diabetes mellitus    Hypertension    Migraines    Stroke [facility])     Past Surgical History:  Procedure Laterality Date   ABDOMINAL AORTOGRAM W/LOWER EXTREMITY Right 05/06/2022   Procedure: ABDOMINAL AORTOGRAM W/LOWER EXTREMITY;  Surgeon: Waynetta Sandy, MD;  Location: [place] [facility];  Service: Cardiovascular;  Laterality: Right;   CAST APPLICATION Right 0/86/7619   Procedure: CAST APPLICATION;  Surgeon: Carole Civil, MD;  Location: [facility];  Service: Orthopedics;  Laterality: Right;   ESOPHAGOGASTRODUODENOSCOPY (EGD) WITH PROPOFOL N/A 09/16/2022   Procedure: ESOPHAGOGASTRODUODENOSCOPY (EGD) WITH PROPOFOL;  Surgeon: Eloise Harman, DO;  Location: [facility];  Service: Endoscopy;  Laterality: N/A;  12:30pm, asa 3, pt knows to arrive at [place] Right 11/02/2021   Procedure: INCISION AND DRAINAGE RIGHT KNEE;  Surgeon: Carole Civil, MD;  Location: [facility];  Service: Orthopedics;  Laterality: Right;   IR 3D INDEPENDENT WKST  10/11/2021   IR 3D INDEPENDENT WKST  02/28/2022   IR 3D INDEPENDENT WKST  03/14/2022   IR ANGIOGRAM SELECTIVE EACH ADDITIONAL VESSEL  10/11/2021   IR ANGIOGRAM  SELECTIVE EACH ADDITIONAL VESSEL  10/11/2021   IR ANGIOGRAM SELECTIVE EACH ADDITIONAL VESSEL  10/11/2021   IR ANGIOGRAM SELECTIVE EACH ADDITIONAL VESSEL  10/11/2021   IR ANGIOGRAM SELECTIVE EACH ADDITIONAL VESSEL  10/11/2021   IR ANGIOGRAM SELECTIVE EACH ADDITIONAL VESSEL  10/11/2021   IR ANGIOGRAM SELECTIVE EACH ADDITIONAL VESSEL  10/25/2021   IR ANGIOGRAM SELECTIVE EACH ADDITIONAL VESSEL  10/25/2021   IR ANGIOGRAM SELECTIVE EACH ADDITIONAL VESSEL  02/28/2022   IR ANGIOGRAM SELECTIVE EACH ADDITIONAL VESSEL  02/28/2022   IR ANGIOGRAM SELECTIVE EACH ADDITIONAL VESSEL  02/28/2022   IR ANGIOGRAM SELECTIVE EACH ADDITIONAL VESSEL  03/14/2022   IR ANGIOGRAM VISCERAL SELECTIVE  10/11/2021   IR ANGIOGRAM VISCERAL SELECTIVE  02/28/2022   IR ANGIOGRAM VISCERAL SELECTIVE  03/14/2022   IR EMBO TUMOR ORGAN ISCHEMIA INFARCT INC GUIDE ROADMAPPING  10/25/2021   IR EMBO TUMOR ORGAN ISCHEMIA INFARCT INC GUIDE ROADMAPPING  10/25/2021   IR EMBO TUMOR ORGAN ISCHEMIA INFARCT INC GUIDE ROADMAPPING  03/14/2022   IR RADIOLOGIST EVAL & MGMT  08/23/2021   IR RADIOLOGIST EVAL & MGMT  11/21/2021   IR RADIOLOGIST EVAL & MGMT  01/24/2022   IR RADIOLOGIST EVAL & MGMT  04/09/2022   IR RADIOLOGIST EVAL & MGMT  08/05/2022   IR US GUIDE VASC ACCESS RIGHT  10/11/2021   IR US GUIDE VASC ACCESS RIGHT  10/25/2021   IR US GUIDE VASC ACCESS RIGHT  02/28/2022  IR US GUIDE VASC ACCESS RIGHT  03/14/2022   MANDIBLE SURGERY     PERIPHERAL VASCULAR INTERVENTION Right 05/06/2022   Procedure: PERIPHERAL VASCULAR INTERVENTION;  Surgeon: Waynetta Sandy, MD;  Location: [place] [facility];  Service: Cardiovascular;  Laterality: Right;  SFA   QUADRICEPS TENDON REPAIR Right 09/04/2021   Procedure: REPAIR QUADRICEP TENDON;  Surgeon: Carole Civil, MD;  Location: [facility];  Service: Orthopedics;  Laterality: Right;   TENDON EXPLORATION Right 11/02/2021   Procedure: QUADRICEPS TENDON AND PATELLAR TENDON EXPLORATION;  Surgeon: Carole Civil, MD;  Location: [facility]  [facility];  Service: Orthopedics;  Laterality: Right;    ROS:   General:  No weight loss, Fever, chills  HEENT: No recent headaches, no nasal bleeding, no visual changes, no sore throat  Neurologic: No dizziness, blackouts, seizures. No recent symptoms of stroke or mini- stroke. No recent episodes of slurred speech, or temporary blindness.  Cardiac: No recent episodes of chest pain/pressure, no shortness of breath at rest.  No shortness of breath with exertion.  Denies history of atrial fibrillation or irregular heartbeat  Vascular: No history of rest pain in feet.  No history of claudication.  No history of non-healing ulcer, No history of DVT   Pulmonary: No home oxygen, no productive cough, no hemoptysis,  No asthma or wheezing  Musculoskeletal:  '[ ]'$  Arthritis, '[ ]'$  Low back pain,  '[ ]'$  Joint pain  Hematologic:No history of hypercoagulable state.  No history of easy bleeding.  No history of anemia  Gastrointestinal: No hematochezia or melena,  No gastroesophageal reflux, no trouble swallowing  Urinary: '[ ]'$  chronic Kidney disease, '[ ]'$  on HD - '[ ]'$  MWF or '[ ]'$  TTHS, '[ ]'$  Burning with urination, '[ ]'$  Frequent urination, '[ ]'$  Difficulty urinating;   Skin: No rashes  Psychological: No history of anxiety,  No history of depression  Social History Social History   Tobacco Use   Smoking status: Every Day    Packs/day: 0.50    Years: 20.00    Total pack years: 10.00    Types: Cigarettes    Passive exposure: Current   Smokeless tobacco: Never  Vaping Use   Vaping Use: Never used  Substance Use Topics   Alcohol use: Not Currently    Comment: Quit 11 months ago. Used to drink about 6 pack a day. (documented 06/06/21)   Drug use: Yes    Types: Marijuana    Comment: occas    Family History Family History  Problem Relation Age of Onset   Heart failure Mother    Stroke Father    Stroke Sister    Cancer Sister        breast cancer   Colon cancer Neg Hx    Liver cancer Neg Hx      Allergies  Allergies  Allergen Reactions   Ultram [Tramadol] Hives   Benadryl [Diphenhydramine] Itching and Rash     Current Outpatient Medications  Medication Sig Dispense Refill   albuterol (VENTOLIN HFA) 108 (90 Base) MCG/ACT inhaler Inhale 2 puffs into the lungs every 6 (six) hours as needed for wheezing or shortness of breath.     aspirin EC 81 MG tablet Take 1 tablet (81 mg total) by mouth daily. Swallow whole. 150 tablet 2   Cholecalciferol (VITAMIN D-3) 125 MCG (5000 UT) TABS Take 5,000 Units by mouth daily.     citalopram (CELEXA) 20 MG tablet Take 20 mg by mouth daily.     clopidogrel (PLAVIX)  75 MG tablet Take 1 tablet (75 mg total) by mouth daily. 30 tablet 11   gabapentin (NEURONTIN) 600 MG tablet Take 600 mg by mouth in the morning and at bedtime.     Melatonin 10 MG CAPS Take 10 mg by mouth at bedtime.     metFORMIN (GLUCOPHAGE) 500 MG tablet Take 1 tablet (500 mg total) by mouth 2 (two) times daily with a meal. 60 tablet 2   naloxone (NARCAN) nasal spray 4 mg/0.1 mL Place 1 spray into the nose once.     oxyCODONE-acetaminophen (PERCOCET) 10-325 MG tablet Take 1 tablet by mouth every 4 (four) hours as needed for pain. 30 tablet 0   pantoprazole (PROTONIX) 40 MG tablet Take 1 tablet (40 mg total) by mouth daily. 30 tablet 2   QUEtiapine (SEROQUEL) 100 MG tablet Take 100 mg by mouth at bedtime.     rosuvastatin (CRESTOR) 10 MG tablet Take 1 tablet (10 mg total) by mouth daily. 30 tablet 11   No current facility-administered medications for this visit.    Physical Examination  Vitals:   10/02/22 1445  BP: (!) 104/58  Pulse: 80  Temp: 98 F (36.7 C)  TempSrc: Temporal  SpO2: 96%  Weight: 161 lb (73 kg)  Height: '5\' 9"'$  (1.753 m)    Body mass index is 23.78 kg/m.  General:  Alert and oriented, no acute distress HEENT: Normal Neck: No bruit or JVD Pulmonary: Clear to auscultation bilaterally Cardiac: Regular Rate and Rhythm without murmur Abdomen: Soft,  non-tender, non-distended, no mass, no scars Skin: No rash, healed right LE knee and right GT wounds. Extremity B Feet warm and well perfused without ischemic changes Musculoskeletal: No deformity or edema  Neurologic: Upper and lower extremity motor 5/5 and symmetric  DATA:  ABI Findings:  +---------+------------------+-----+---------+--------+  Right   Rt Pressure (mmHg)IndexWaveform Comment   +---------+------------------+-----+---------+--------+  Brachial 120                                       +---------+------------------+-----+---------+--------+  PTA     136               1.13 triphasic          +---------+------------------+-----+---------+--------+  DP      145               1.21 triphasic          +---------+------------------+-----+---------+--------+  Great Toe117               0.98                    +---------+------------------+-----+---------+--------+   +---------+------------------+-----+---------+-------+  Left    Lt Pressure (mmHg)IndexWaveform Comment  +---------+------------------+-----+---------+-------+  Brachial 117                                      +---------+------------------+-----+---------+-------+  PTA     141               1.18 triphasic         +---------+------------------+-----+---------+-------+  DP      135               1.12 biphasic          +---------+------------------+-----+---------+-------+  Ellen Henri  0.93                   +---------+------------------+-----+---------+-------+   +-------+-----------+-----------+------------+------------+  ABI/TBIToday's ABIToday's TBIPrevious ABIPrevious TBI  +-------+-----------+-----------+------------+------------+  Right 1.21       0.98       0.99        0.81          +-------+-----------+-----------+------------+------------+  Left  1.18       0.93       0.95        0.75           +-------+-----------+-----------+------------+------------+     Bilateral ABIs appear increased compared to prior study on 06/19/2022.    Summary:  Right: Resting right ankle-brachial index is within normal range. The  right toe-brachial index is normal.   Left: Resting left ankle-brachial index is within normal range. The left  toe-brachial index is normal.   +----------+--------+-----+--------+--------+--------+  RIGHT    PSV cm/sRatioStenosisWaveformComments  +----------+--------+-----+--------+--------+--------+  CFA Distal150                  biphasic          +----------+--------+-----+--------+--------+--------+  DFA      124                  biphasic          +----------+--------+-----+--------+--------+--------+  SFA Prox  122                  biphasic          +----------+--------+-----+--------+--------+--------+  SFA Mid   111                  biphasic          +----------+--------+-----+--------+--------+--------+  POP Prox  133                  biphasic          +----------+--------+-----+--------+--------+--------+       Right Stent(s):  +---------------+--------+--------+--------+--------+  Mid-distal SFA PSV cm/sStenosisWaveformComments  +---------------+--------+--------+--------+--------+  Prox to Stent  120             biphasic          +---------------+--------+--------+--------+--------+  Proximal Stent 103             biphasic          +---------------+--------+--------+--------+--------+  Mid Stent      92              biphasic          +---------------+--------+--------+--------+--------+  Distal Stent   76              biphasic          +---------------+--------+--------+--------+--------+  Distal to Stent83              biphasic          +---------------+--------+--------+--------+--------+     Summary:  Right: Widely patent SFA stent.        ASSESSMENT/PLAN:   PAD with history of non healing right LE wounds S/P SFA stent His arterial duplex demonstrates patent stent with biphasic flow.  The ABI's B are WNL.    He will stay as active, try to increase his nutritional intake, continue to work on smoking cessation.  He will continue  aspirin, Plavix, statin daily.  I will have him return in 1 year for f/u surveillance with repeat duplex and ABI's.    Roxy Horseman PA-C  [facility] [place] Office: [phone]  MD in clinic [place]

## 2022-10-03 ENCOUNTER — Other Ambulatory Visit: Payer: Self-pay | Admitting: Orthopedic Surgery

## 2022-10-03 DIAGNOSIS — T8189XD Other complications of procedures, not elsewhere classified, subsequent encounter: Secondary | ICD-10-CM

## 2022-10-03 MED ORDER — OXYCODONE-ACETAMINOPHEN 10-325 MG PO TABS: 1.0000 | ORAL_TABLET | ORAL | 0 refills | Status: DC | PRN

## 2022-10-03 NOTE — Telephone Encounter (Signed)
Valorie called for the patient requesting a refill on Oxycodone 10-325, 30 quantity, 1 every 4 hours to be sent to [facility] in [place].  She knows it's a day early, she just wanted to get the request in before the weekend.  Phone # [phone]

## 2022-10-08 ENCOUNTER — Other Ambulatory Visit: Payer: Self-pay | Admitting: Orthopedic Surgery

## 2022-10-08 DIAGNOSIS — T8189XD Other complications of procedures, not elsewhere classified, subsequent encounter: Secondary | ICD-10-CM

## 2022-10-08 MED ORDER — OXYCODONE-ACETAMINOPHEN 10-325 MG PO TABS: 1.0000 | ORAL_TABLET | ORAL | 0 refills | Status: DC | PRN

## 2022-10-10 ENCOUNTER — Other Ambulatory Visit: Payer: Self-pay | Admitting: Interventional Radiology

## 2022-10-10 DIAGNOSIS — C220 Liver cell carcinoma: Secondary | ICD-10-CM

## 2022-10-11 ENCOUNTER — Other Ambulatory Visit: Payer: Self-pay | Admitting: Orthopedic Surgery

## 2022-10-11 DIAGNOSIS — T8189XD Other complications of procedures, not elsewhere classified, subsequent encounter: Secondary | ICD-10-CM

## 2022-10-14 MED ORDER — OXYCODONE-ACETAMINOPHEN 10-325 MG PO TABS: 1.0000 | ORAL_TABLET | ORAL | 0 refills | Status: DC | PRN

## 2022-10-18 ENCOUNTER — Other Ambulatory Visit: Payer: Self-pay | Admitting: Orthopedic Surgery

## 2022-10-18 DIAGNOSIS — T8189XD Other complications of procedures, not elsewhere classified, subsequent encounter: Secondary | ICD-10-CM

## 2022-10-19 ENCOUNTER — Other Ambulatory Visit: Payer: Self-pay | Admitting: Orthopedic Surgery

## 2022-10-19 DIAGNOSIS — T8189XD Other complications of procedures, not elsewhere classified, subsequent encounter: Secondary | ICD-10-CM

## 2022-10-20 ENCOUNTER — Other Ambulatory Visit: Payer: Self-pay | Admitting: Orthopedic Surgery

## 2022-10-20 DIAGNOSIS — T8189XD Other complications of procedures, not elsewhere classified, subsequent encounter: Secondary | ICD-10-CM

## 2022-10-21 ENCOUNTER — Encounter: Payer: Self-pay | Admitting: Orthopedic Surgery

## 2022-10-21 ENCOUNTER — Other Ambulatory Visit: Payer: Self-pay | Admitting: Orthopedic Surgery

## 2022-10-21 DIAGNOSIS — T8189XD Other complications of procedures, not elsewhere classified, subsequent encounter: Secondary | ICD-10-CM

## 2022-10-21 MED ORDER — OXYCODONE-ACETAMINOPHEN 10-325 MG PO TABS: 1.0000 | ORAL_TABLET | ORAL | 0 refills | Status: DC | PRN

## 2022-10-28 ENCOUNTER — Encounter: Payer: Self-pay | Admitting: Orthopedic Surgery

## 2022-10-28 ENCOUNTER — Other Ambulatory Visit: Payer: Self-pay | Admitting: Orthopedic Surgery

## 2022-10-28 DIAGNOSIS — T8189XD Other complications of procedures, not elsewhere classified, subsequent encounter: Secondary | ICD-10-CM

## 2022-10-28 MED ORDER — OXYCODONE-ACETAMINOPHEN 10-325 MG PO TABS: 1.0000 | ORAL_TABLET | ORAL | 0 refills | Status: DC | PRN

## 2022-10-29 ENCOUNTER — Other Ambulatory Visit: Payer: Self-pay | Admitting: Radiology

## 2022-10-29 MED ORDER — GABAPENTIN 600 MG PO TABS: 600.0000 mg | ORAL_TABLET | Freq: Two times a day (BID) | ORAL | 1 refills | Status: DC

## 2022-10-29 NOTE — Telephone Encounter (Signed)
Patient request refill of Gabapentin 679m one bid quantity 180 was previoulsy given by CHanley Hayssent here for refill

## 2022-10-31 ENCOUNTER — Other Ambulatory Visit: Payer: Self-pay | Admitting: *Deleted

## 2022-10-31 DIAGNOSIS — C220 Liver cell carcinoma: Secondary | ICD-10-CM

## 2022-11-01 ENCOUNTER — Other Ambulatory Visit: Payer: Self-pay | Admitting: Orthopedic Surgery

## 2022-11-01 DIAGNOSIS — T8189XD Other complications of procedures, not elsewhere classified, subsequent encounter: Secondary | ICD-10-CM

## 2022-11-01 MED ORDER — OXYCODONE-ACETAMINOPHEN 10-325 MG PO TABS: 1.0000 | ORAL_TABLET | ORAL | 0 refills | Status: DC | PRN

## 2022-11-05 ENCOUNTER — Other Ambulatory Visit: Payer: Self-pay | Admitting: Orthopedic Surgery

## 2022-11-05 DIAGNOSIS — T8189XD Other complications of procedures, not elsewhere classified, subsequent encounter: Secondary | ICD-10-CM

## 2022-11-05 MED ORDER — OXYCODONE-ACETAMINOPHEN 10-325 MG PO TABS: 1.0000 | ORAL_TABLET | ORAL | 0 refills | Status: DC | PRN

## 2022-11-07 ENCOUNTER — Encounter: Payer: Self-pay | Admitting: Radiology

## 2022-11-11 ENCOUNTER — Other Ambulatory Visit: Payer: Self-pay | Admitting: Orthopedic Surgery

## 2022-11-11 DIAGNOSIS — T8189XD Other complications of procedures, not elsewhere classified, subsequent encounter: Secondary | ICD-10-CM

## 2022-11-11 MED ORDER — OXYCODONE-ACETAMINOPHEN 10-325 MG PO TABS: 1.0000 | ORAL_TABLET | ORAL | 0 refills | Status: DC | PRN

## 2022-11-14 ENCOUNTER — Other Ambulatory Visit: Payer: Self-pay | Admitting: Orthopedic Surgery

## 2022-11-14 DIAGNOSIS — T8189XD Other complications of procedures, not elsewhere classified, subsequent encounter: Secondary | ICD-10-CM

## 2022-11-15 ENCOUNTER — Encounter: Payer: Self-pay | Admitting: Orthopedic Surgery

## 2022-11-15 ENCOUNTER — Other Ambulatory Visit: Payer: Self-pay | Admitting: Orthopedic Surgery

## 2022-11-15 DIAGNOSIS — T8189XD Other complications of procedures, not elsewhere classified, subsequent encounter: Secondary | ICD-10-CM

## 2022-11-15 NOTE — Telephone Encounter (Signed)
Returned the call regarding medication.  Explained that the requested that was sent via mychart was sent to Dr. Aline Brochure and he'll see it on Monday.

## 2022-11-18 MED ORDER — OXYCODONE-ACETAMINOPHEN 10-325 MG PO TABS: 1.0000 | ORAL_TABLET | ORAL | 0 refills | Status: DC | PRN

## 2022-11-21 ENCOUNTER — Other Ambulatory Visit: Payer: Self-pay | Admitting: Orthopedic Surgery

## 2022-11-21 DIAGNOSIS — T8189XD Other complications of procedures, not elsewhere classified, subsequent encounter: Secondary | ICD-10-CM

## 2022-11-22 ENCOUNTER — Other Ambulatory Visit: Payer: Self-pay | Admitting: Orthopedic Surgery

## 2022-11-22 DIAGNOSIS — T8189XD Other complications of procedures, not elsewhere classified, subsequent encounter: Secondary | ICD-10-CM

## 2022-11-22 MED ORDER — OXYCODONE-ACETAMINOPHEN 10-325 MG PO TABS: 1.0000 | ORAL_TABLET | ORAL | 0 refills | Status: DC | PRN

## 2022-11-22 NOTE — Telephone Encounter (Signed)
Danny [name], the patient's niece called and lvm requesting a refill for the patient for Oxycodone 10-325, 30 quantity, every 4hrs PRN to be sent to [facility].  She stated that she didn't want to run into the same issues as last time, I'm not sure what that was.  I have lvm that the request has been sent and Dr. Aline Brochure will be back in the office on Monday.  Phone # [phone]

## 2022-11-25 ENCOUNTER — Other Ambulatory Visit: Payer: Self-pay | Admitting: Orthopedic Surgery

## 2022-11-25 ENCOUNTER — Telehealth: Payer: Self-pay | Admitting: Orthopedic Surgery

## 2022-11-25 ENCOUNTER — Other Ambulatory Visit (HOSPITAL_COMMUNITY): Payer: Medicaid Other

## 2022-11-25 ENCOUNTER — Encounter: Payer: Self-pay | Admitting: Orthopedic Surgery

## 2022-11-25 DIAGNOSIS — T8189XD Other complications of procedures, not elsewhere classified, subsequent encounter: Secondary | ICD-10-CM

## 2022-11-25 MED ORDER — OXYCODONE-ACETAMINOPHEN 10-325 MG PO TABS: 1.0000 | ORAL_TABLET | ORAL | 0 refills | Status: DC | PRN

## 2022-11-25 NOTE — Telephone Encounter (Signed)
Danny [name] called for the patient, lvm stating that Dr. Amedeo Kinsman filled the script for the weekend and Dr. Aline Brochure sent it in this morning.  She stated that the patient can't get it filled until 11/27/22 and he will be out of medication for two days.  She would like a call back at [phone].

## 2022-11-25 NOTE — Progress Notes (Signed)
Meds ordered this encounter  Medications   oxyCODONE-acetaminophen (PERCOCET) 10-325 MG tablet    Sig: Take 1 tablet by mouth every 4 (four) hours as needed for up to 5 days for pain.    Dispense:  30 tablet    Refill:  0    

## 2022-11-25 NOTE — Addendum Note (Signed)
Addended byCandice Camp on: 11/25/2022 08:20 AM   Modules accepted: Orders

## 2022-11-25 NOTE — Telephone Encounter (Signed)
I change to pick up today

## 2022-11-25 NOTE — Progress Notes (Signed)
Meds ordered this encounter  Medications   oxyCODONE-acetaminophen (PERCOCET) 10-325 MG tablet    Sig: Take 1 tablet by mouth every 4 (four) hours as needed for up to 5 days for pain.    Dispense:  30 tablet    Refill:  0     Changed to pick up today

## 2022-11-25 NOTE — Telephone Encounter (Signed)
Advised via mychart

## 2022-11-27 ENCOUNTER — Ambulatory Visit (HOSPITAL_COMMUNITY)
Admission: RE | Admit: 2022-11-27 | Discharge: 2022-11-27 | Disposition: A | Discharge status: Home / Self Care | Payer: Medicaid Other | Source: Ambulatory Visit | Attending: Interventional Radiology | Admitting: Interventional Radiology

## 2022-11-27 DIAGNOSIS — C220 Liver cell carcinoma: Secondary | ICD-10-CM | POA: Diagnosis present

## 2022-11-27 MED ORDER — GADOBUTROL 1 MMOL/ML IV SOLN
7.5000 mL | Freq: Once | INTRAVENOUS | Status: AC | PRN
  Administered 2022-11-27: 7.5 mL via INTRAVENOUS

## 2022-11-28 ENCOUNTER — Other Ambulatory Visit: Payer: Self-pay | Admitting: Orthopedic Surgery

## 2022-11-28 DIAGNOSIS — T8189XD Other complications of procedures, not elsewhere classified, subsequent encounter: Secondary | ICD-10-CM

## 2022-11-28 MED ORDER — OXYCODONE-ACETAMINOPHEN 10-325 MG PO TABS: 1.0000 | ORAL_TABLET | ORAL | 0 refills | Status: DC | PRN

## 2022-11-29 ENCOUNTER — Ambulatory Visit
Admission: RE | Admit: 2022-11-29 | Discharge: 2022-11-29 | Disposition: A | Discharge status: Home / Self Care | Payer: Medicaid Other | Source: Ambulatory Visit | Attending: Interventional Radiology | Admitting: Interventional Radiology

## 2022-11-29 DIAGNOSIS — C220 Liver cell carcinoma: Secondary | ICD-10-CM

## 2022-11-29 HISTORY — PX: IR RADIOLOGIST EVAL & MGMT: IMG5224

## 2022-11-29 NOTE — Progress Notes (Signed)
Referring Physician(s): Roosevelt Locks, NP   Reason for follow up:  8 months status post right transarterial radioembolization, and 13 months status post left transarterial radioembolization   He presents today via virtual telephone office visit.   History of present illness: Danny [name] is a 57 y.o. male with history of alcoholic cirrhosis and multifocal hepatocellular carcinoma.  He had complained of right upper quadrant pain for approximately 1 year.  Multiple imaging studies eventually led to a CT which demonstrated enhancing lesions in the left lobe of the liver.  This prompted and MRI which demonstrates a 2.7 cm LIRADS 5 mass in segment II and LIRADS 3 lesion in the right dome.  There was a 1.6 cm mass on prior CTA in segment III which was inconspicuous on MR, but highly suspicious for additional HCC.  For this, he is status post left hepatic TARE on 10/25/21.  Approximately 50% of the dose was delivered in segmentectomy fashion to the index segment II hepatoma followed by delivery of the remaining in a lobar distribution.  Initial 3 month post-TARE MRI abdomen showed enlargement of right sided masses and good response of the left sided, treated masses.  Therefore, he underwent additional mapping and then segment VIII radiation segmentectomy on 03/14/22.     He continues to overall do well.  He continues to endorse a mild aching pain under his right ribs, without any exacerbating factors.  He endorses a good appetite.  No jaundice or scleral icterus, nausea, vomiting.         Past Medical History:  Diagnosis Date   Alcohol abuse    Anxiety    Back pain    CVA (cerebral infarction)    Depression    Diabetes mellitus    Hypertension    Migraines    Stroke [facility])     Past Surgical History:  Procedure Laterality Date   ABDOMINAL AORTOGRAM W/LOWER EXTREMITY Right 05/06/2022   Procedure: ABDOMINAL AORTOGRAM W/LOWER EXTREMITY;  Surgeon: Waynetta Sandy, MD;  Location: [place] [facility];  Service: Cardiovascular;  Laterality: Right;   CAST APPLICATION Right [id number]   Procedure: CAST APPLICATION;  Surgeon: Carole Civil, MD;  Location: [facility];  Service: Orthopedics;  Laterality: Right;   ESOPHAGOGASTRODUODENOSCOPY (EGD) WITH PROPOFOL N/A 09/16/2022   Procedure: ESOPHAGOGASTRODUODENOSCOPY (EGD) WITH PROPOFOL;  Surgeon: Eloise Harman, DO;  Location[facility];  Service: Endoscopy;  Laterality: N/A;  12:30pm, asa 3, pt knows to arrive at [place] Right 11/02/2021   Procedure: INCISION AND DRAINAGE RIGHT KNEE;  Surgeon: Carole Civil, MD;  Location: [facility];  Service: Orthopedics;  Laterality: Right;   IR 3D INDEPENDENT WKST  10/11/2021   IR 3D INDEPENDENT WKST  02/28/2022   IR 3D INDEPENDENT WKST  03/14/2022   IR ANGIOGRAM SELECTIVE EACH ADDITIONAL VESSEL  10/11/2021   IR ANGIOGRAM SELECTIVE EACH ADDITIONAL VESSEL  10/11/2021   IR ANGIOGRAM SELECTIVE EACH ADDITIONAL VESSEL  10/11/2021   IR ANGIOGRAM SELECTIVE EACH ADDITIONAL VESSEL  10/11/2021   IR ANGIOGRAM SELECTIVE EACH ADDITIONAL VESSEL  10/11/2021   IR ANGIOGRAM SELECTIVE EACH ADDITIONAL VESSEL  10/11/2021   IR ANGIOGRAM SELECTIVE EACH ADDITIONAL VESSEL  10/25/2021   IR ANGIOGRAM SELECTIVE EACH ADDITIONAL VESSEL  10/25/2021   IR ANGIOGRAM SELECTIVE EACH ADDITIONAL VESSEL  02/28/2022   IR ANGIOGRAM SELECTIVE EACH ADDITIONAL VESSEL  02/28/2022   IR ANGIOGRAM SELECTIVE EACH ADDITIONAL VESSEL  02/28/2022  IR ANGIOGRAM SELECTIVE EACH ADDITIONAL VESSEL  03/14/2022   IR ANGIOGRAM VISCERAL SELECTIVE  10/11/2021   IR ANGIOGRAM VISCERAL SELECTIVE  02/28/2022   IR ANGIOGRAM VISCERAL SELECTIVE  03/14/2022   IR EMBO TUMOR ORGAN ISCHEMIA INFARCT INC GUIDE ROADMAPPING  10/25/2021   IR EMBO TUMOR ORGAN ISCHEMIA INFARCT INC GUIDE ROADMAPPING  10/25/2021   IR EMBO TUMOR ORGAN ISCHEMIA INFARCT INC GUIDE ROADMAPPING  03/14/2022   IR RADIOLOGIST EVAL & MGMT  08/23/2021   IR RADIOLOGIST EVAL & MGMT   11/21/2021   IR RADIOLOGIST EVAL & MGMT  01/24/2022   IR RADIOLOGIST EVAL & MGMT  04/09/2022   IR RADIOLOGIST EVAL & MGMT  08/05/2022   IR US GUIDE VASC ACCESS RIGHT  10/11/2021   IR US GUIDE VASC ACCESS RIGHT  10/25/2021   IR US GUIDE VASC ACCESS RIGHT  02/28/2022   IR US GUIDE VASC ACCESS RIGHT  03/14/2022   MANDIBLE SURGERY     PERIPHERAL VASCULAR INTERVENTION Right 05/06/2022   Procedure: PERIPHERAL VASCULAR INTERVENTION;  Surgeon: Waynetta Sandy, MD;  Location: [place] [facility];  Service: Cardiovascular;  Laterality: Right;  SFA   QUADRICEPS TENDON REPAIR Right 09/04/2021   Procedure: REPAIR QUADRICEP TENDON;  Surgeon: Carole Civil, MD;  Location: [facility];  Service: Orthopedics;  Laterality: Right;   TENDON EXPLORATION Right 11/02/2021   Procedure: QUADRICEPS TENDON AND PATELLAR TENDON EXPLORATION;  Surgeon: Carole Civil, MD;  Location: [facility];  Service: Orthopedics;  Laterality: Right;    Allergies: Ultram [tramadol] and Benadryl [diphenhydramine]  Medications: Prior to Admission medications   Medication Sig Start Date End Date Taking? Authorizing Provider  albuterol (VENTOLIN HFA) 108 (90 Base) MCG/ACT inhaler Inhale 2 puffs into the lungs every 6 (six) hours as needed for wheezing or shortness of breath.    [provider]  aspirin EC 81 MG tablet Take 1 tablet (81 mg total) by mouth daily. Swallow whole. 05/07/22 07/31/23  Dagoberto Ligas, PA-C  Cholecalciferol (VITAMIN D-3) 125 MCG (5000 UT) TABS Take 5,000 Units by mouth daily.    [provider]  citalopram (CELEXA) 20 MG tablet Take 20 mg by mouth daily.    [provider]  clopidogrel (PLAVIX) 75 MG tablet Take 1 tablet (75 mg total) by mouth daily. 05/06/22 05/06/23  Waynetta Sandy, MD  gabapentin (NEURONTIN) 600 MG tablet Take 1 tablet (600 mg total) by mouth in the morning and at bedtime. 10/29/22   Carole Civil, MD  Melatonin 10 MG CAPS Take 10 mg by mouth at  bedtime.    [provider]  metFORMIN (GLUCOPHAGE) 500 MG tablet Take 1 tablet (500 mg total) by mouth 2 (two) times daily with a meal. 01/01/18   Julianne Rice, MD  naloxone [facility]) nasal spray 4 mg/0.1 mL Place 1 spray into the nose once. 01/01/22   [provider]  oxyCODONE-acetaminophen (PERCOCET) 10-325 MG tablet Take 1 tablet by mouth every 4 (four) hours as needed for up to 5 days for pain. 11/28/22 12/03/22  Carole Civil, MD  pantoprazole (PROTONIX) 40 MG tablet Take 1 tablet (40 mg total) by mouth daily. 05/07/22 05/07/23  Dagoberto Ligas, PA-C  QUEtiapine (SEROQUEL) 100 MG tablet Take 100 mg by mouth at bedtime.    [provider]  rosuvastatin (CRESTOR) 10 MG tablet Take 1 tablet (10 mg total) by mouth daily. 05/06/22 05/06/23  Waynetta Sandy, MD     Family History  Problem Relation Age of Onset  Heart failure Mother    Stroke Father    Stroke Sister    Cancer Sister        breast cancer   Colon cancer Neg Hx    Liver cancer Neg Hx     Social History   Socioeconomic History   Marital status: Divorced    Spouse name: Not on file   Number of children: Not on file   Years of education: Not on file   Highest education level: Not on file  Occupational History   Not on file  Tobacco Use   Smoking status: Every Day    Packs/day: 0.50    Years: 20.00    Additional pack years: 0.00    Total pack years: 10.00    Types: Cigarettes    Passive exposure: Current   Smokeless tobacco: Never  Vaping Use   Vaping Use: Never used  Substance and Sexual Activity   Alcohol use: Not Currently    Comment: Quit 11 months ago. Used to drink about 6 pack a day. (documented 06/06/21)   Drug use: Yes    Types: Marijuana    Comment: occas   Sexual activity: Yes    Birth control/protection: None  Other Topics Concern   Not on file  Social History Narrative   Not on file   Social Determinants of Health   Financial Resource Strain: Not on  file  Food Insecurity: Not on file  Transportation Needs: Not on file  Physical Activity: Not on file  Stress: Not on file  Social Connections: Not on file     Vital Signs: There were no vitals taken for this visit.  No physical examination was performed in lieu of virtual telephone clinic visit.   Imaging: Mapping study 02/28/22     Post Seg VIII TARE (03/14/22)    MR Abdomen 07/18/22  Post-TARE changes in left lobe and segment VIII, no focal enhancing lesions - LIRADS TR-non viable.  MR Abdomen 11/27/22  Post-TARE changes in left lobe and segment VIII, no focal enhancing lesions - LIRADS TR-non viable.  Labs:  CBC: Recent Labs    02/28/22 0822 03/14/22 0800 04/20/22 1103 05/06/22 1002 09/16/22 0733  WBC 4.5 5.6 4.7  --  3.8*  HGB 12.1* 13.5 12.5* 15.6 13.8  HCT 35.8* 39.8 36.7* 46.0 39.9  PLT 130* 128* 108*  --  104*    COAGS: Recent Labs    02/28/22 0822 09/16/22 0733  INR 1.1 1.2    BMP: Recent Labs    02/28/22 0822 03/14/22 0800 04/20/22 1103 05/06/22 1002 09/16/22 0733  NA 138 137 137 140 137  K 3.9 4.1 3.8 3.9 3.6  CL 107 108 103 106 103  CO2 23 21* 26  --  25  GLUCOSE 178* 140* 146* 144* 115*  BUN 16 27* 9 10 10   CALCIUM 9.0 9.5 9.2  --  9.3  CREATININE 0.90 1.25* 0.78 0.60* 0.78  GFRNONAA >60 >60 >60  --  >60    LIVER FUNCTION TESTS: Recent Labs    02/28/22 0822 03/14/22 0800 09/16/22 0733  BILITOT 1.1 0.9 1.6*  AST 149* 77* 50*  ALT 68* 69* 33  ALKPHOS 140* 146* 107  PROT 6.3* 7.8 7.3  ALBUMIN 3.0* 3.9 3.4*   A-FP 06/11/21: 12.4 10/11/21: 8.0 07/02/22: 7.5   Assessment and Plan: 57 year old male with history of alcoholic cirrhosis (Child Pugh A, ALBI 2) and multifocal, bilobar hepatocellular carcinoma now status post left lobar hepatic transarterial radioembolization  on 10/25/21 and segment VIII radiation segmentectomy on 03/14/22.  Overall he is recovering well after the procedures.     -plan for MRI abdomen in September  2024 to assess treatment response -obtain a-FP -follow up with IR clinic visit shortly thereafter to review results   Electronically Signed: Taylor 11/29/2022, 9:18 AM   I spent a total of 25 Minutes in virtual telephone clinical consultation, greater than 50% of which was counseling/coordinating care for hepatocellular carcinoma.

## 2022-12-03 ENCOUNTER — Other Ambulatory Visit: Payer: Self-pay | Admitting: Orthopedic Surgery

## 2022-12-03 DIAGNOSIS — T8189XD Other complications of procedures, not elsewhere classified, subsequent encounter: Secondary | ICD-10-CM

## 2022-12-03 MED ORDER — OXYCODONE-ACETAMINOPHEN 10-325 MG PO TABS: 1.0000 | ORAL_TABLET | ORAL | 0 refills | Status: DC | PRN

## 2022-12-05 ENCOUNTER — Other Ambulatory Visit: Payer: Self-pay | Admitting: Orthopedic Surgery

## 2022-12-05 DIAGNOSIS — T8189XD Other complications of procedures, not elsewhere classified, subsequent encounter: Secondary | ICD-10-CM

## 2022-12-06 ENCOUNTER — Other Ambulatory Visit: Payer: Self-pay | Admitting: Orthopedic Surgery

## 2022-12-06 ENCOUNTER — Encounter: Payer: Self-pay | Admitting: Orthopedic Surgery

## 2022-12-06 DIAGNOSIS — T8189XD Other complications of procedures, not elsewhere classified, subsequent encounter: Secondary | ICD-10-CM

## 2022-12-09 MED ORDER — OXYCODONE-ACETAMINOPHEN 10-325 MG PO TABS: 1.0000 | ORAL_TABLET | ORAL | 0 refills | Status: AC | PRN

## 2022-12-15 ENCOUNTER — Encounter: Payer: Self-pay | Admitting: Orthopedic Surgery

## 2022-12-16 ENCOUNTER — Other Ambulatory Visit: Payer: Self-pay

## 2022-12-16 ENCOUNTER — Telehealth: Payer: Self-pay | Admitting: Orthopedic Surgery

## 2022-12-16 DIAGNOSIS — T8189XD Other complications of procedures, not elsewhere classified, subsequent encounter: Secondary | ICD-10-CM

## 2022-12-16 MED ORDER — OXYCODONE-ACETAMINOPHEN 10-325 MG PO TABS: 1.0000 | ORAL_TABLET | ORAL | 0 refills | Status: DC | PRN

## 2022-12-16 NOTE — Telephone Encounter (Signed)
Dr. Mort Sawyers pt - Vikki Ports, pt's niece called requesting a refill for the pt for Oxycodone 5mg , 30 quantity, 1 every 4hrs PRN to be sent to [facility].  She would like a call when this has been sent.  [phone]

## 2022-12-16 NOTE — Telephone Encounter (Signed)
Called, lvm for [employer]

## 2022-12-19 ENCOUNTER — Other Ambulatory Visit: Payer: Self-pay | Admitting: Orthopedic Surgery

## 2022-12-19 ENCOUNTER — Encounter: Payer: Self-pay | Admitting: Orthopedic Surgery

## 2022-12-19 DIAGNOSIS — S76111D Strain of right quadriceps muscle, fascia and tendon, subsequent encounter: Secondary | ICD-10-CM

## 2022-12-19 MED ORDER — OXYCODONE-ACETAMINOPHEN 10-325 MG PO TABS: 1.0000 | ORAL_TABLET | ORAL | 0 refills | Status: DC | PRN

## 2022-12-24 ENCOUNTER — Other Ambulatory Visit: Payer: Self-pay | Admitting: Orthopedic Surgery

## 2022-12-25 MED ORDER — OXYCODONE-ACETAMINOPHEN 10-325 MG PO TABS: 1.0000 | ORAL_TABLET | ORAL | 0 refills | Status: DC | PRN

## 2022-12-27 ENCOUNTER — Other Ambulatory Visit: Payer: Self-pay | Admitting: Orthopedic Surgery

## 2022-12-30 MED ORDER — OXYCODONE-ACETAMINOPHEN 10-325 MG PO TABS: 1.0000 | ORAL_TABLET | ORAL | 0 refills | Status: DC | PRN

## 2023-01-02 ENCOUNTER — Other Ambulatory Visit: Payer: Self-pay | Admitting: Orthopedic Surgery

## 2023-01-03 ENCOUNTER — Other Ambulatory Visit: Payer: Self-pay | Admitting: Orthopedic Surgery

## 2023-01-03 ENCOUNTER — Encounter: Payer: Self-pay | Admitting: Orthopedic Surgery

## 2023-01-03 NOTE — Telephone Encounter (Signed)
I called and left a message letting patient know that there is no doctor here on Friday afternoons and medication refill requests are suppose to be called in before noon on Thursday to give the doctor time to get it sent in to the pharmacy.

## 2023-01-06 MED ORDER — OXYCODONE-ACETAMINOPHEN 10-325 MG PO TABS: 1.0000 | ORAL_TABLET | ORAL | 0 refills | Status: DC | PRN

## 2023-01-09 ENCOUNTER — Other Ambulatory Visit: Payer: Self-pay | Admitting: Orthopedic Surgery

## 2023-01-09 MED ORDER — OXYCODONE-ACETAMINOPHEN 10-325 MG PO TABS: 1.0000 | ORAL_TABLET | ORAL | 0 refills | Status: DC | PRN

## 2023-01-14 ENCOUNTER — Other Ambulatory Visit: Payer: Self-pay | Admitting: Orthopedic Surgery

## 2023-01-14 MED ORDER — OXYCODONE-ACETAMINOPHEN 10-325 MG PO TABS: 1.0000 | ORAL_TABLET | ORAL | 0 refills | Status: DC | PRN

## 2023-01-16 ENCOUNTER — Other Ambulatory Visit: Payer: Self-pay | Admitting: Orthopedic Surgery

## 2023-01-16 MED ORDER — OXYCODONE-ACETAMINOPHEN 10-325 MG PO TABS: 1.0000 | ORAL_TABLET | ORAL | 0 refills | Status: DC | PRN

## 2023-01-21 ENCOUNTER — Other Ambulatory Visit: Payer: Self-pay | Admitting: Orthopedic Surgery

## 2023-01-21 DIAGNOSIS — E440 Moderate protein-calorie malnutrition: Secondary | ICD-10-CM | POA: Insufficient documentation

## 2023-01-21 MED ORDER — OXYCODONE-ACETAMINOPHEN 10-325 MG PO TABS: 1.0000 | ORAL_TABLET | ORAL | 0 refills | Status: DC | PRN

## 2023-01-22 ENCOUNTER — Telehealth: Payer: Self-pay

## 2023-01-22 NOTE — Telephone Encounter (Signed)
Thank you :)

## 2023-01-22 NOTE — Telephone Encounter (Signed)
Consult note from [facility] is scanned under media for review for this patient.

## 2023-01-23 ENCOUNTER — Other Ambulatory Visit: Payer: Self-pay | Admitting: Orthopedic Surgery

## 2023-01-25 MED ORDER — OXYCODONE-ACETAMINOPHEN 10-325 MG PO TABS: 1.0000 | ORAL_TABLET | ORAL | 0 refills | Status: DC | PRN

## 2023-01-29 ENCOUNTER — Other Ambulatory Visit: Payer: Self-pay | Admitting: Orthopedic Surgery

## 2023-01-30 ENCOUNTER — Encounter: Payer: Self-pay | Admitting: Orthopedic Surgery

## 2023-01-30 ENCOUNTER — Other Ambulatory Visit: Payer: Self-pay | Admitting: Orthopedic Surgery

## 2023-01-30 MED ORDER — OXYCODONE-ACETAMINOPHEN 10-325 MG PO TABS: 1.0000 | ORAL_TABLET | ORAL | 0 refills | Status: DC | PRN

## 2023-02-03 ENCOUNTER — Other Ambulatory Visit: Payer: Self-pay | Admitting: Orthopedic Surgery

## 2023-02-04 ENCOUNTER — Other Ambulatory Visit: Payer: Self-pay | Admitting: Orthopedic Surgery

## 2023-02-04 MED ORDER — OXYCODONE-ACETAMINOPHEN 10-325 MG PO TABS: 1.0000 | ORAL_TABLET | ORAL | 0 refills | Status: DC | PRN

## 2023-02-06 ENCOUNTER — Other Ambulatory Visit: Payer: Self-pay | Admitting: Orthopedic Surgery

## 2023-02-06 ENCOUNTER — Encounter: Payer: Self-pay | Admitting: Orthopedic Surgery

## 2023-02-06 MED ORDER — OXYCODONE-ACETAMINOPHEN 10-325 MG PO TABS: 1.0000 | ORAL_TABLET | ORAL | 0 refills | Status: DC | PRN

## 2023-02-11 ENCOUNTER — Other Ambulatory Visit: Payer: Self-pay | Admitting: Orthopedic Surgery

## 2023-02-12 MED ORDER — OXYCODONE-ACETAMINOPHEN 10-325 MG PO TABS: 1.0000 | ORAL_TABLET | ORAL | 0 refills | Status: DC | PRN

## 2023-02-13 ENCOUNTER — Other Ambulatory Visit: Payer: Self-pay | Admitting: Orthopedic Surgery

## 2023-02-13 MED ORDER — OXYCODONE-ACETAMINOPHEN 10-325 MG PO TABS: 1.0000 | ORAL_TABLET | ORAL | 0 refills | Status: AC | PRN

## 2023-02-19 ENCOUNTER — Telehealth: Payer: Self-pay

## 2023-02-19 ENCOUNTER — Encounter: Payer: Self-pay | Admitting: Orthopedic Surgery

## 2023-02-19 ENCOUNTER — Other Ambulatory Visit: Payer: Self-pay | Admitting: Orthopedic Surgery

## 2023-02-19 NOTE — Telephone Encounter (Signed)
Dr. Mort Sawyers pt - spoke w/Valorie, requesting a refill on Oxycodone 10-325, 30 quantity, every 4 hours PRN for pain for the patient to be sent to [facility]. [phone]

## 2023-02-19 NOTE — Telephone Encounter (Signed)
[name]  Danny Ports called and is stating she has called several times about Danny [name] medicine.   If someone would please call her back at [phone].    She states she has called several times and left voicemail's and left messages on MyChart and no one has called her back

## 2023-02-20 MED ORDER — OXYCODONE-ACETAMINOPHEN 10-325 MG PO TABS: 1.0000 | ORAL_TABLET | ORAL | 0 refills | Status: DC | PRN

## 2023-02-23 ENCOUNTER — Other Ambulatory Visit: Payer: Self-pay | Admitting: Orthopedic Surgery

## 2023-02-24 MED ORDER — OXYCODONE-ACETAMINOPHEN 10-325 MG PO TABS: 1.0000 | ORAL_TABLET | ORAL | 0 refills | Status: DC | PRN

## 2023-02-27 ENCOUNTER — Other Ambulatory Visit: Payer: Self-pay | Admitting: Orthopedic Surgery

## 2023-02-27 MED ORDER — OXYCODONE-ACETAMINOPHEN 10-325 MG PO TABS: 1.0000 | ORAL_TABLET | ORAL | 0 refills | Status: DC | PRN

## 2023-02-28 ENCOUNTER — Ambulatory Visit (INDEPENDENT_AMBULATORY_CARE_PROVIDER_SITE_OTHER): Payer: Medicaid Other | Admitting: Orthopedic Surgery

## 2023-02-28 ENCOUNTER — Encounter: Payer: Self-pay | Admitting: Orthopedic Surgery

## 2023-02-28 ENCOUNTER — Other Ambulatory Visit (INDEPENDENT_AMBULATORY_CARE_PROVIDER_SITE_OTHER): Payer: Medicaid Other

## 2023-02-28 ENCOUNTER — Telehealth: Payer: Self-pay | Admitting: Orthopedic Surgery

## 2023-02-28 VITALS — BP 156/78 | HR 112 | Ht 69.0 in | Wt 167.0 lb

## 2023-02-28 DIAGNOSIS — R2241 Localized swelling, mass and lump, right lower limb: Secondary | ICD-10-CM

## 2023-02-28 DIAGNOSIS — S76111S Strain of right quadriceps muscle, fascia and tendon, sequela: Secondary | ICD-10-CM

## 2023-02-28 DIAGNOSIS — S76111D Strain of right quadriceps muscle, fascia and tendon, subsequent encounter: Secondary | ICD-10-CM | POA: Diagnosis not present

## 2023-02-28 NOTE — Patient Instructions (Addendum)
Pain clinic will give you a call they are at [name] in [place] [phone]

## 2023-02-28 NOTE — Progress Notes (Signed)
Chief Complaint  Patient presents with   Knee Pain    Pain in right knee with knot    57 year old male who had a quadriceps tendon repair which eventually had a wound breakdown infection required multiple treatments for wound healing along with antibiotics has severe peripheral artery disease and other issues with his liver and nutrition comes in with a mass just above the previously noted repair of the quadriceps tendon it is not painful and has been present for approximately 2 weeks  He is managed with oxycodone for chronic pain which started before his injury and then progressively worsened after his injury and surgery  In any event he has a well-healed incision over the front of the knee the wound healed as well it is then in terms of the thickness of the skin but nonetheless intact just above that there is a palpable defect and a lump or bump about 3 cm wide by 1-1/2 cm long  He has an extensor lag but does have intact extensor function  X-ray was obtained soft tissue mass under the skin suture anchors noted as well unrelated to the mass  Based on the functional deficit which is minimal and the x-ray this is most likely part of his quadriceps tendon or scar tissue  The patient is also referred to pain management for chronic opioid therapy

## 2023-02-28 NOTE — Telephone Encounter (Signed)
[facility] called and they have some questions about his medicine.    Please call them back at [phone].

## 2023-03-02 ENCOUNTER — Other Ambulatory Visit: Payer: Self-pay | Admitting: Orthopedic Surgery

## 2023-03-03 MED ORDER — OXYCODONE-ACETAMINOPHEN 10-325 MG PO TABS: 1.0000 | ORAL_TABLET | ORAL | 0 refills | Status: DC | PRN

## 2023-03-04 ENCOUNTER — Telehealth: Payer: Self-pay

## 2023-03-04 NOTE — Telephone Encounter (Signed)
He has been referred to pain management they will take over. If he has a pain contract and UDS they should be able to prescribe differently   I tried calling but can't get through.

## 2023-03-04 NOTE — Telephone Encounter (Signed)
I called.

## 2023-03-04 NOTE — Telephone Encounter (Signed)
Dr. Romeo Apple patient--------Amanda (pharmacist) left message stating that she would like an update about this patient. He has been in Percocet 10 MG for quite awhile now and wants to know about any update to his treatment. Stated his medicine is being picking up every 4 days  Please call Marchelle Folks at [phone]

## 2023-03-06 ENCOUNTER — Other Ambulatory Visit: Payer: Self-pay | Admitting: Orthopedic Surgery

## 2023-03-06 MED ORDER — OXYCODONE-ACETAMINOPHEN 10-325 MG PO TABS: 1.0000 | ORAL_TABLET | ORAL | 0 refills | Status: DC | PRN

## 2023-03-11 ENCOUNTER — Other Ambulatory Visit: Payer: Self-pay | Admitting: Orthopedic Surgery

## 2023-03-11 MED ORDER — OXYCODONE-ACETAMINOPHEN 10-325 MG PO TABS: 1.0000 | ORAL_TABLET | ORAL | 0 refills | Status: DC | PRN

## 2023-03-12 ENCOUNTER — Other Ambulatory Visit: Payer: Self-pay | Admitting: Orthopedic Surgery

## 2023-03-12 MED ORDER — OXYCODONE-ACETAMINOPHEN 10-325 MG PO TABS: 1.0000 | ORAL_TABLET | ORAL | 0 refills | Status: DC | PRN

## 2023-03-18 ENCOUNTER — Other Ambulatory Visit: Payer: Self-pay | Admitting: Orthopedic Surgery

## 2023-03-18 MED ORDER — OXYCODONE-ACETAMINOPHEN 10-325 MG PO TABS: 1.0000 | ORAL_TABLET | ORAL | 0 refills | Status: DC | PRN

## 2023-03-18 NOTE — Addendum Note (Signed)
Addended by: Michaele Offer on: 03/18/2023 12:43 PM   Modules accepted: Orders

## 2023-03-20 ENCOUNTER — Other Ambulatory Visit: Payer: Self-pay | Admitting: Orthopedic Surgery

## 2023-03-20 MED ORDER — OXYCODONE-ACETAMINOPHEN 10-325 MG PO TABS: 1.0000 | ORAL_TABLET | ORAL | 0 refills | Status: DC | PRN

## 2023-03-26 ENCOUNTER — Other Ambulatory Visit: Payer: Self-pay | Admitting: Orthopedic Surgery

## 2023-03-26 ENCOUNTER — Encounter: Payer: Self-pay | Admitting: Orthopedic Surgery

## 2023-03-26 MED ORDER — OXYCODONE-ACETAMINOPHEN 10-325 MG PO TABS: 1.0000 | ORAL_TABLET | ORAL | 0 refills | Status: DC | PRN

## 2023-03-30 ENCOUNTER — Other Ambulatory Visit: Payer: Self-pay | Admitting: Orthopedic Surgery

## 2023-03-31 MED ORDER — OXYCODONE-ACETAMINOPHEN 10-325 MG PO TABS: 1.0000 | ORAL_TABLET | ORAL | 0 refills | Status: DC | PRN

## 2023-04-03 ENCOUNTER — Encounter: Payer: Self-pay | Admitting: Orthopedic Surgery

## 2023-04-03 ENCOUNTER — Other Ambulatory Visit: Payer: Self-pay | Admitting: Orthopedic Surgery

## 2023-04-03 MED ORDER — OXYCODONE-ACETAMINOPHEN 10-325 MG PO TABS: 1.0000 | ORAL_TABLET | ORAL | 0 refills | Status: DC | PRN

## 2023-04-03 NOTE — Telephone Encounter (Signed)
Appointment with pain management is today

## 2023-04-22 ENCOUNTER — Other Ambulatory Visit: Payer: Self-pay | Admitting: Interventional Radiology

## 2023-04-22 DIAGNOSIS — C220 Liver cell carcinoma: Secondary | ICD-10-CM

## 2023-04-29 ENCOUNTER — Other Ambulatory Visit: Payer: Self-pay | Admitting: *Deleted

## 2023-04-29 DIAGNOSIS — C220 Liver cell carcinoma: Secondary | ICD-10-CM

## 2023-05-23 ENCOUNTER — Ambulatory Visit (HOSPITAL_COMMUNITY)
Admission: RE | Admit: 2023-05-23 | Discharge: 2023-05-23 | Disposition: A | Discharge status: Home / Self Care | Payer: Medicaid Other | Source: Ambulatory Visit | Attending: Interventional Radiology | Admitting: Interventional Radiology

## 2023-05-23 DIAGNOSIS — C220 Liver cell carcinoma: Secondary | ICD-10-CM | POA: Diagnosis present

## 2023-05-23 MED ORDER — GADOBUTROL 1 MMOL/ML IV SOLN
7.4000 mL | Freq: Once | INTRAVENOUS | Status: AC | PRN
  Administered 2023-05-23: 7.4 mL via INTRAVENOUS

## 2023-05-26 LAB — AFP TUMOR MARKER: AFP-Tumor Marker: 7 ng/mL — ABNORMAL HIGH (ref <6.1)

## 2023-06-11 ENCOUNTER — Ambulatory Visit
Admission: RE | Admit: 2023-06-11 | Discharge: 2023-06-11 | Disposition: A | Discharge status: Home / Self Care | Payer: Medicaid Other | Source: Ambulatory Visit | Attending: Interventional Radiology | Admitting: Interventional Radiology

## 2023-06-11 DIAGNOSIS — C220 Liver cell carcinoma: Secondary | ICD-10-CM

## 2023-06-11 HISTORY — PX: IR RADIOLOGIST EVAL & MGMT: IMG5224

## 2023-06-11 NOTE — Progress Notes (Signed)
Referring Physician(s): Annamarie Major, NP, Earnest Bailey, DO   Reason for follow up:  8 months status post right transarterial radioembolization, and 13 months status post left transarterial radioembolization   He presents today via virtual telephone office visit.   History of present illness: Danny [name] is a 57 y.o. male with history of alcoholic cirrhosis and multifocal hepatocellular carcinoma.  He had complained of right upper quadrant pain for approximately 1 year.  Multiple imaging studies eventually led to a CT which demonstrated enhancing lesions in the left lobe of the liver.  This prompted and MRI which demonstrates a 2.7 cm LIRADS 5 mass in segment II and LIRADS 3 lesion in the right dome.  There was a 1.6 cm mass on prior CTA in segment III which was inconspicuous on MR, but highly suspicious for additional HCC.  For this, he is status post left hepatic TARE on 10/25/21.  Approximately 50% of the dose was delivered in segmentectomy fashion to the index segment II hepatoma followed by delivery of the remaining in a lobar distribution.  Initial 3 month post-TARE MRI abdomen showed enlargement of right sided masses and good response of the left sided, treated masses.  Therefore, he underwent additional mapping and then segment VIII radiation segmentectomy on 03/14/22.     He continues to overall do well.   He endorses a good appetite.  No jaundice or scleral icterus, nausea, vomiting.   Swollen legs for the past 3 days.  He had surveillance MRI performed on 05/23/23.   Past Medical History:  Diagnosis Date   Alcohol abuse    Anxiety    Back pain    CVA (cerebral infarction)    Depression    Diabetes mellitus    Hypertension    Migraines    Stroke [facility])     Past Surgical History:  Procedure Laterality Date   ABDOMINAL AORTOGRAM W/LOWER EXTREMITY Right 05/06/2022   Procedure: ABDOMINAL AORTOGRAM W/LOWER EXTREMITY;  Surgeon: Maeola Harman, MD;  Location: [facility]  INVASIVE CV LAB;  Service: Cardiovascular;  Laterality: Right;   CAST APPLICATION Right 11/02/2021   Procedure: CAST APPLICATION;  Surgeon: Vickki Hearing, MD;  Location: [facility] ORS;  Service: Orthopedics;  Laterality: Right;   ESOPHAGOGASTRODUODENOSCOPY (EGD) WITH PROPOFOL N/A 09/16/2022   Procedure: ESOPHAGOGASTRODUODENOSCOPY (EGD) WITH PROPOFOL;  Surgeon: Lanelle Bal, DO;  Location: [facility] ENDO SUITE;  Service: Endoscopy;  Laterality: N/A;  12:30pm, asa 3, pt knows to arrive at 6:45   FOOT SURGERY     INCISION AND DRAINAGE Right 11/02/2021   Procedure: INCISION AND DRAINAGE RIGHT KNEE;  Surgeon: Vickki Hearing, MD;  Location: [facility] ORS;  Service: Orthopedics;  Laterality: Right;   IR 3D INDEPENDENT WKST  10/11/2021   IR 3D INDEPENDENT WKST  02/28/2022   IR 3D INDEPENDENT WKST  03/14/2022   IR ANGIOGRAM SELECTIVE EACH ADDITIONAL VESSEL  10/11/2021   IR ANGIOGRAM SELECTIVE EACH ADDITIONAL VESSEL  10/11/2021   IR ANGIOGRAM SELECTIVE EACH ADDITIONAL VESSEL  10/11/2021   IR ANGIOGRAM SELECTIVE EACH ADDITIONAL VESSEL  10/11/2021   IR ANGIOGRAM SELECTIVE EACH ADDITIONAL VESSEL  10/11/2021   IR ANGIOGRAM SELECTIVE EACH ADDITIONAL VESSEL  10/11/2021   IR ANGIOGRAM SELECTIVE EACH ADDITIONAL VESSEL  10/25/2021   IR ANGIOGRAM SELECTIVE EACH ADDITIONAL VESSEL  10/25/2021   IR ANGIOGRAM SELECTIVE EACH ADDITIONAL VESSEL  02/28/2022   IR ANGIOGRAM SELECTIVE EACH ADDITIONAL VESSEL  02/28/2022   IR ANGIOGRAM SELECTIVE EACH ADDITIONAL VESSEL  02/28/2022   IR ANGIOGRAM  SELECTIVE EACH ADDITIONAL VESSEL  03/14/2022   IR ANGIOGRAM VISCERAL SELECTIVE  10/11/2021   IR ANGIOGRAM VISCERAL SELECTIVE  02/28/2022   IR ANGIOGRAM VISCERAL SELECTIVE  03/14/2022   IR EMBO TUMOR ORGAN ISCHEMIA INFARCT INC GUIDE ROADMAPPING  10/25/2021   IR EMBO TUMOR ORGAN ISCHEMIA INFARCT INC GUIDE ROADMAPPING  10/25/2021   IR EMBO TUMOR ORGAN ISCHEMIA INFARCT INC GUIDE ROADMAPPING  03/14/2022   IR RADIOLOGIST EVAL & MGMT  08/23/2021   IR RADIOLOGIST EVAL & MGMT   11/21/2021   IR RADIOLOGIST EVAL & MGMT  01/24/2022   IR RADIOLOGIST EVAL & MGMT  04/09/2022   IR RADIOLOGIST EVAL & MGMT  08/05/2022   IR RADIOLOGIST EVAL & MGMT  11/29/2022   IR US GUIDE VASC ACCESS RIGHT  10/11/2021   IR US GUIDE VASC ACCESS RIGHT  10/25/2021   IR US GUIDE VASC ACCESS RIGHT  02/28/2022   IR US GUIDE VASC ACCESS RIGHT  03/14/2022   MANDIBLE SURGERY     PERIPHERAL VASCULAR INTERVENTION Right 05/06/2022   Procedure: PERIPHERAL VASCULAR INTERVENTION;  Surgeon: Maeola Harman, MD;  Location: [facility] INVASIVE CV LAB;  Service: Cardiovascular;  Laterality: Right;  SFA   QUADRICEPS TENDON REPAIR Right 09/04/2021   Procedure: REPAIR QUADRICEP TENDON;  Surgeon: Vickki Hearing, MD;  Location: [facility];  Service: Orthopedics;  Laterality: Right;   TENDON EXPLORATION Right 11/02/2021   Procedure: QUADRICEPS TENDON AND PATELLAR TENDON EXPLORATION;  Surgeon: Vickki Hearing, MD;  Location: [facility];  Service: Orthopedics;  Laterality: Right;    Allergies: Trazodone, Ultram [tramadol], and Benadryl [diphenhydramine]  Medications: Prior to Admission medications   Medication Sig Start Date End Date Taking? Authorizing Provider  albuterol (VENTOLIN HFA) 108 (90 Base) MCG/ACT inhaler Inhale 2 puffs into the lungs every 6 (six) hours as needed for wheezing or shortness of breath.    [provider]  aspirin EC 81 MG tablet Take 1 tablet (81 mg total) by mouth daily. Swallow whole. 05/07/22 07/31/23  Emilie Rutter, PA-C  Cholecalciferol (VITAMIN D-3) 125 MCG (5000 UT) TABS Take 5,000 Units by mouth daily.    [provider]  citalopram (CELEXA) 20 MG tablet Take 20 mg by mouth daily.    [provider]  gabapentin (NEURONTIN) 600 MG tablet Take 1 tablet (600 mg total) by mouth in the morning and at bedtime. 10/29/22   Vickki Hearing, MD  Melatonin 10 MG CAPS Take 10 mg by mouth at bedtime.    [provider]  metFORMIN (GLUCOPHAGE) 500 MG tablet  Take 1 tablet (500 mg total) by mouth 2 (two) times daily with a meal. 01/01/18   Loren Racer, MD  naloxone [facility]) nasal spray 4 mg/0.1 mL Place 1 spray into the nose once. 01/01/22   [provider]  oxyCODONE-acetaminophen (PERCOCET) 10-325 MG tablet Take 1 tablet by mouth every 4 (four) hours as needed for pain. 04/03/23   Vickki Hearing, MD  pantoprazole (PROTONIX) 40 MG tablet Take 1 tablet (40 mg total) by mouth daily. 05/07/22 05/07/23  Emilie Rutter, PA-C  QUEtiapine (SEROQUEL) 100 MG tablet Take 100 mg by mouth at bedtime.    [provider]  rosuvastatin (CRESTOR) 10 MG tablet Take 1 tablet (10 mg total) by mouth daily. 05/06/22 05/06/23  Maeola Harman, MD     Family History  Problem Relation Age of Onset   Heart failure Mother    Stroke Father    Stroke Sister    Cancer Sister  breast cancer   Colon cancer Neg Hx    Liver cancer Neg Hx     Social History   Socioeconomic History   Marital status: Divorced    Spouse name: Not on file   Number of children: Not on file   Years of education: Not on file   Highest education level: Not on file  Occupational History   Not on file  Tobacco Use   Smoking status: Every Day    Current packs/day: 0.50    Average packs/day: 0.5 packs/day for 20.0 years (10.0 ttl pk-yrs)    Types: Cigarettes    Passive exposure: Current   Smokeless tobacco: Never  Vaping Use   Vaping status: Never Used  Substance and Sexual Activity   Alcohol use: Not Currently    Comment: Quit 11 months ago. Used to drink about 6 pack a day. (documented 06/06/21)   Drug use: Yes    Types: Marijuana    Comment: occas   Sexual activity: Yes    Birth control/protection: None  Other Topics Concern   Not on file  Social History Narrative   Not on file   Social Determinants of Health   Financial Resource Strain: Not on file  Food Insecurity: Low Risk  (01/21/2023)   [facility]    H[facility] Vital Sign    Worried About Running Out of Food in the Last Year: Never true    Ran Out of Food in the Last Year: Never true  Transportation Needs: Not on file (01/21/2023)  Physical Activity: Not on file  Stress: Not on file  Social Connections: Not on file     Vital Signs: There were no vitals taken for this visit.  No physical examination was performed in lieu of virtual telephone clinic visit.   Imaging: Mapping study 02/28/22     Post Seg VIII TARE (03/14/22)    MR Abdomen 07/18/22  Post-TARE changes in left lobe and segment VIII, no focal enhancing lesions - LIRADS TR-non viable.   MR Abdomen 11/27/22  Post-TARE changes in left lobe and segment VIII, no focal enhancing lesions - LIRADS TR-non viable.  MR abdomen 05/23/23  Post-TARE changes in left lobe and segment VIII.  New segment IV 1.7 cm enhancing mass with washout, previously 1.5 cm = LIRADS 5.  Labs:  CBC: Recent Labs    09/16/22 0733  WBC 3.8*  HGB 13.8  HCT 39.9  PLT 104*    COAGS: Recent Labs    09/16/22 0733  INR 1.2    BMP: Recent Labs    09/16/22 0733  NA 137  K 3.6  CL 103  CO2 25  GLUCOSE 115*  BUN 10  CALCIUM 9.3  CREATININE 0.78  GFRNONAA >60    LIVER FUNCTION TESTS: Recent Labs    09/16/22 0733  BILITOT 1.6*  AST 50*  ALT 33  ALKPHOS 107  PROT 7.3  ALBUMIN 3.4*   A-FP 06/11/21: 12.4 10/11/21: 8.0 07/02/22: 7.5 05/23/23: 7.0  Assessment and Plan: 57 year old male with history of alcoholic cirrhosis (Child Pugh A, ALBI 2) and multifocal, bilobar hepatocellular carcinoma now status post left lobar hepatic transarterial radioembolization on 10/25/21 and segment VIII radiation segmentectomy on 03/14/22.  Overall he is recovering well after the procedures.  Recent surveillance MRI demonstrates slightly enlarged LIRADS 5 segment IV mass, a-FP has been stable.  We discussed next steps including observation or locoregional therapy, specifically transarterial  chemoembolization.  At this point, he  elects to repeat MRI in 3 months to assess any change before pursuing an additional procedure.   -plan for 3 month MRI abdomen  -obtain CBC, CMP, INR, and a-FP around time of MRI -follow up with IR clinic visit shortly thereafter to review results   Electronically Signed: Thressa Sheller Tajanay Hurley 06/11/2023, 8:56 AM   I spent a total of 25 Minutes in virtual telephone clinical consultation, greater than 50% of which was counseling/coordinating care for hepatocellular carcinoma.

## 2023-09-02 IMAGING — MR MR ABDOMEN WO/W CM
20 series · 48 of 48 positions shown · IV contrast (7.5 ml Gadavist)
Comparison: 06/29/2021

CLINICAL DATA: Follow-up multifocal hepatocellular carcinoma.
Status post [AGE] radio embolization. Cirrhosis.

EXAM:
MRI ABDOMEN WITHOUT AND WITH CONTRAST
TECHNIQUE: Multiplanar multisequence MR imaging of the abdomen was performed
both before and after the administration of intravenous contrast.
CONTRAST:  7.5mL GADAVIST GADOBUTROL 1 MMOL/ML IV SOLN

[Series 3: cor haste · coronal · 6.0mm · 1.25mm/px · 1 of 38 slices shown]
[im 1/38]
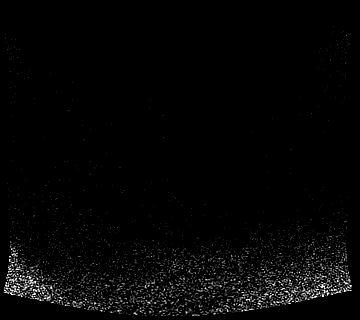

[Series 4: ax haste · axial · 6.0mm · 1.28mm/px · 1 of 45 slices shown]
[im 1/45]
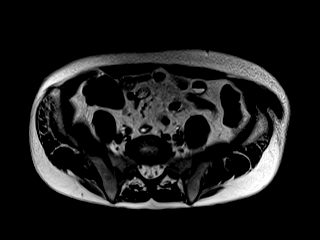

[Series 5: T2 fat-sat · axial · 6.0mm · 1.19mm/px · 1 of 42 slices shown]
[im 1/42]
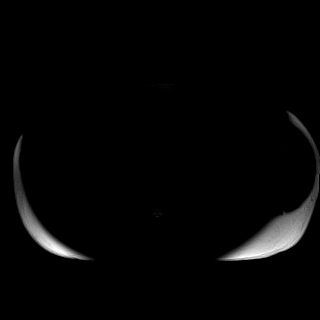

[Series 6: DWI · axial · 6.0mm · 1.53mm/px · 1 of 45 slices shown (1 of 4)]
[im 1/45]
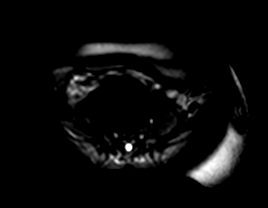

[Series 6: DWI · axial · 6.0mm · 1.53mm/px · z∈[-176,+141]mm · 2 of 45 slices shown (2 of 4)]
[im 1/45]
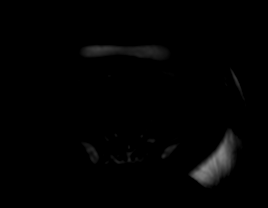
[im 45/45]
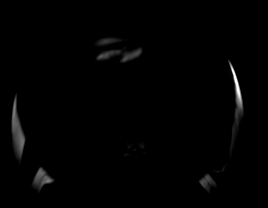

[Series 6: DWI · axial · 6.0mm · 1.53mm/px · z∈[-176,+141]mm · 2 of 45 slices shown (3 of 4)]
[im 1/45]
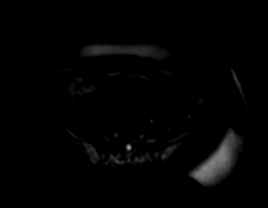
[im 45/45]
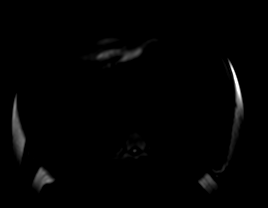

[Series 7: DWI · axial · 6.0mm · 1.53mm/px · z∈[-176,+141]mm · 2 of 45 slices shown (4 of 4)]
[im 1/45]
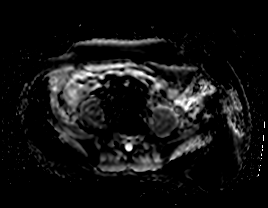
[im 45/45]
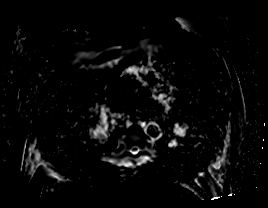

[Series 8: bSSFP · axial · 6.0mm · 0.80mm/px · z∈[-146,+112]mm · 2 of 44 slices shown]
[im 1/44]
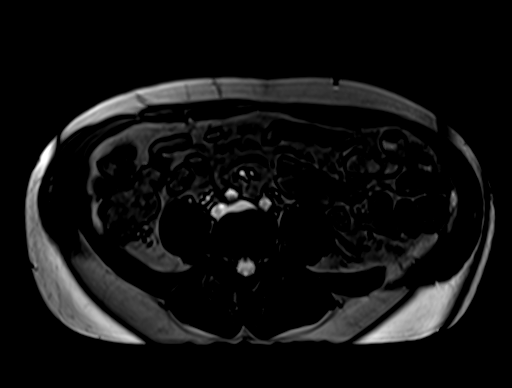
[im 44/44]
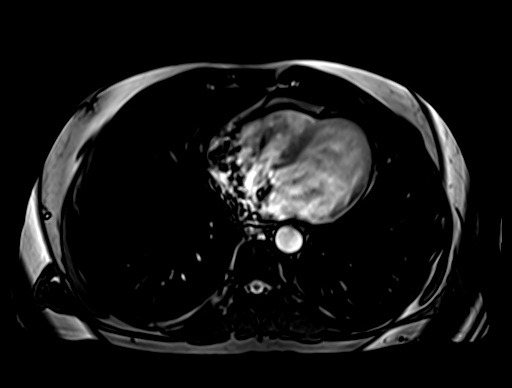

[Series 9: ax in and · axial · 3.0mm · 1.19mm/px · z∈[-122,+115]mm · 3 of 80 slices shown (1 of 2)]
[im 1/80]
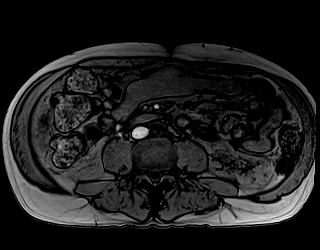
[im 40/80]
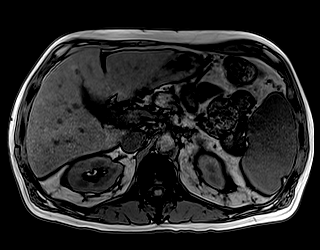
[im 80/80]
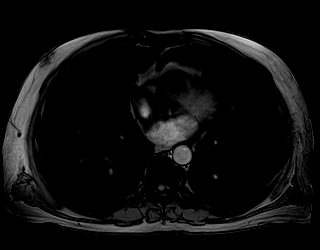

[Series 10: ax in and · axial · 3.0mm · 1.19mm/px · z∈[-122,+115]mm · 3 of 80 slices shown (2 of 2)]
[im 1/80]
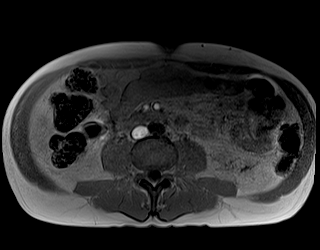
[im 40/80]
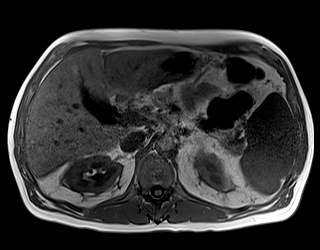
[im 80/80]
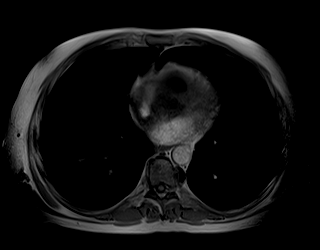

[Series 11: T1 dynamic · axial · 3.0mm · 1.19mm/px · z∈[-151,+110]mm · 3 of 88 slices shown]
[im 1/88]
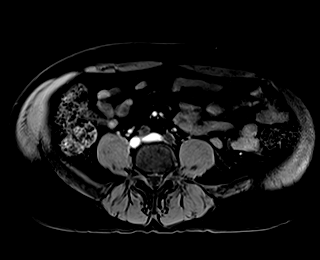
[im 44/88]
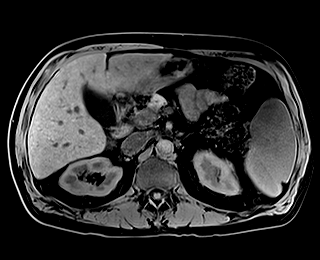
[im 88/88]
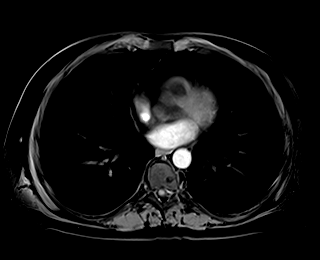

[Series 12: T1 dynamic post-contrast · axial · 3.0mm · 1.19mm/px · z∈[-151,+110]mm · 3 of 88 slices shown (1 of 9)]
[im 1/88]
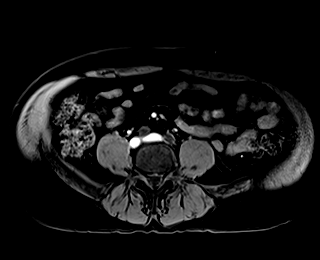
[im 44/88]
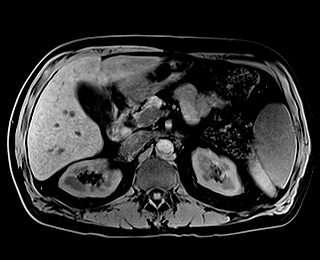
[im 88/88]
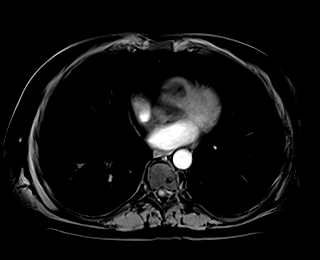

[Series 13: T1 dynamic post-contrast · axial · 3.0mm · 1.19mm/px · z∈[-151,+110]mm · 3 of 88 slices shown (2 of 9)]
[im 1/88]
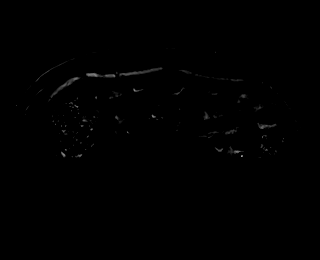
[im 44/88]
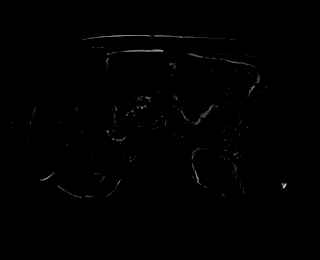
[im 88/88]
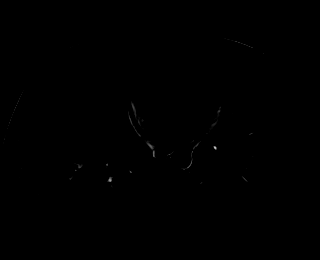

[Series 14: T1 dynamic post-contrast · axial · 3.0mm · 1.19mm/px · z∈[-151,+110]mm · 3 of 88 slices shown (3 of 9)]
[im 1/88]
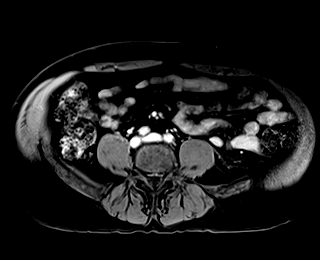
[im 44/88]
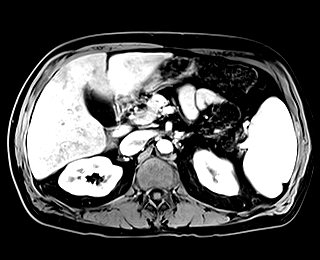
[im 88/88]
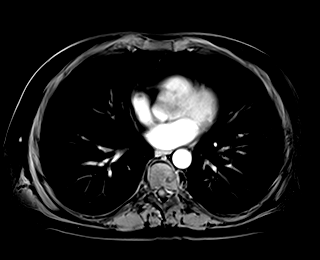

[Series 15: T1 dynamic post-contrast · axial · 3.0mm · 1.19mm/px · z∈[-151,+110]mm · 3 of 88 slices shown (4 of 9)]
[im 1/88]
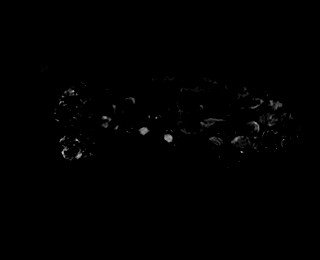
[im 44/88]
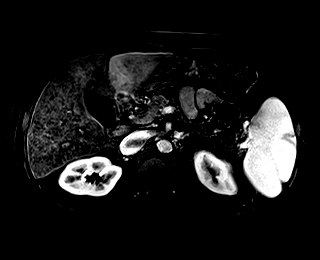
[im 88/88]
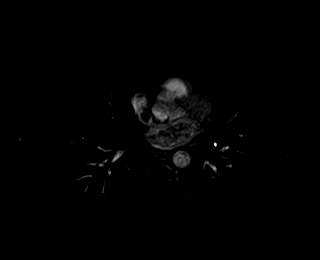

[Series 16: T1 dynamic post-contrast · axial · 3.0mm · 1.19mm/px · z∈[-151,+110]mm · 3 of 88 slices shown (5 of 9)]
[im 1/88]
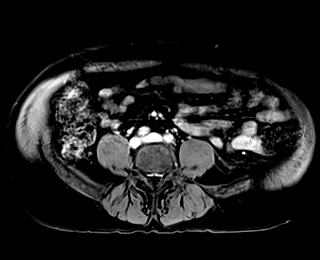
[im 44/88]
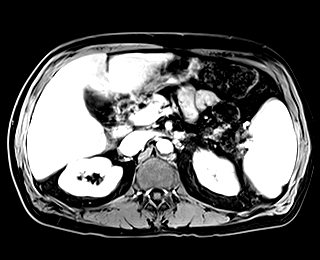
[im 88/88]
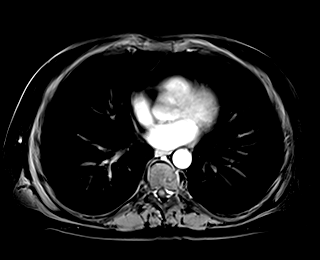

[Series 17: T1 dynamic post-contrast · axial · 3.0mm · 1.19mm/px · z∈[-151,+110]mm · 3 of 88 slices shown (6 of 9)]
[im 1/88]
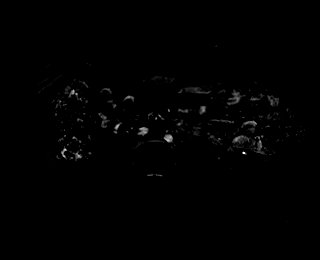
[im 44/88]
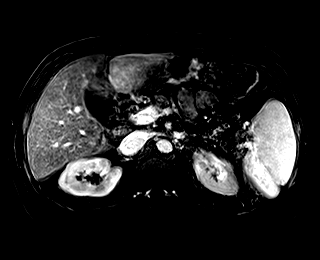
[im 88/88]
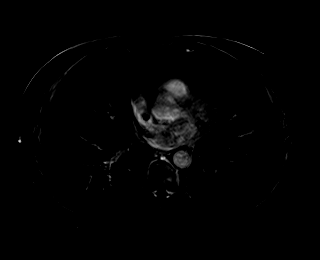

[Series 18: T1 dynamic post-contrast · coronal · 3.0mm · 1.25mm/px · 3 of 72 slices shown (7 of 9)]
[im 1/72]
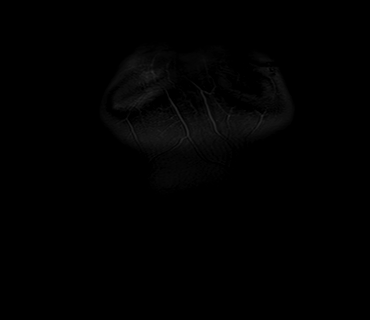
[im 36/72]
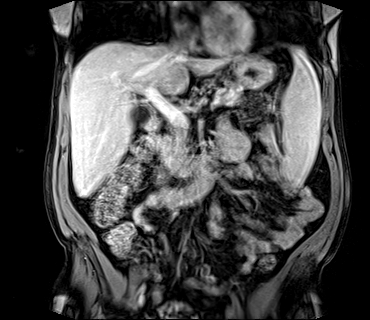
[im 72/72]
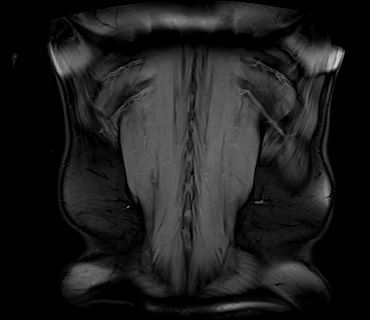

[Series 19: T1 dynamic post-contrast · axial · 3.0mm · 1.19mm/px · z∈[-151,+110]mm · 3 of 88 slices shown (8 of 9)]
[im 1/88]
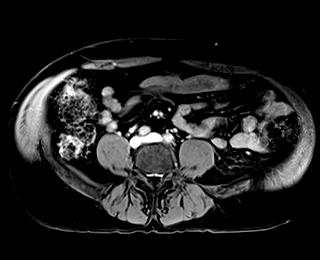
[im 44/88]
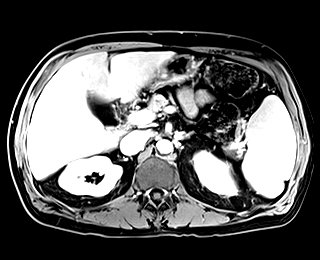
[im 88/88]
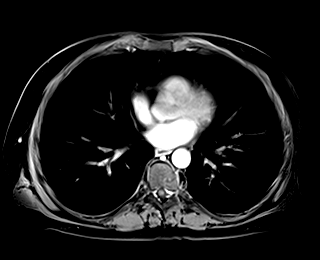

[Series 20: T1 dynamic post-contrast · axial · 3.0mm · 1.19mm/px · z∈[-151,+110]mm · 3 of 88 slices shown (9 of 9)]
[im 1/88]
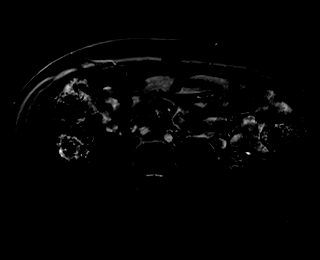
[im 44/88]
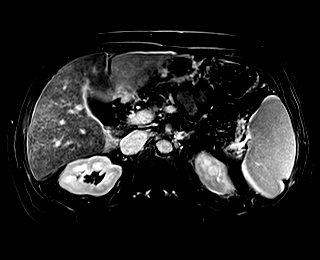
[im 88/88]
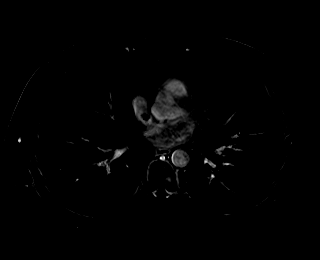

[48 of 48 positions shown; findings below may reference images not displayed]

FINDINGS: Lower chest: No acute findings.

Hepatobiliary: Hepatic cirrhosis again demonstrated. Decreased size
and enhancement of mass seen in central segment 3 of the left lobe
is demonstrated. This currently measures 1.9 x 1.3 cm compared to
2.7 x 2.1 cm previously (see image 37/17), and now shows no internal
contrast enhancement with only thin peripheral rim enhancement.
LR-TR Nonviable

A hypervascular lesion is again seen in the dome of the right
hepatic lobe, measuring 1.5 x 1.1 cm on image 15/14. This is
increased in size from 1.1 cm previously. This lesion shows no
evidence of contrast washout or delayed peripheral rim enhancement.
This now meets criteria for HALE 5.

A 9 mm hypervascular lesion is now seen near the junction of the
right and left lobes on image 32/14, which is not seen previously.
This lesion shows no evidence of contrast washout or delayed
peripheral rim enhancement. HALE 3

Gallbladder is unremarkable. No evidence of biliary ductal
dilatation.

Pancreas:  No mass or inflammatory changes.

Spleen: Stable splenomegaly, consistent with portal venous
hypertension.

Adrenals/Urinary Tract: No masses identified. Small benign-appearing
left renal cyst again noted. No evidence of hydronephrosis.

Stomach/Bowel: Unremarkable.

Vascular/Lymphatic: No pathologically enlarged lymph nodes
identified. No acute vascular findings. Portosystemic collateral
seen in the left upper quadrant, consistent with portal venous
hypertension.

Other:  None.

Musculoskeletal:  No suspicious bone lesions identified.
IMPRESSION: Decreased size and lack of internal enhancement of treated mass in
segment 3 of the left lobe. (LR-TR Nonviable)

Increased size of 15 mm hypervascular lesion in the liver dome.
(HALE Category 5: Definitely HCC)

9 mm hypervascular lesion near the junction of the right and left
lobes, not seen on prior exam. (HALE Category 3: Intermediate
probability of malignancy)

No evidence of abdominal metastatic disease.

Stable splenomegaly and portosystemic collaterals, consistent with
portal venous hypertension.

## 2023-10-10 ENCOUNTER — Other Ambulatory Visit: Payer: Self-pay | Admitting: Interventional Radiology

## 2023-10-10 DIAGNOSIS — C220 Liver cell carcinoma: Secondary | ICD-10-CM

## 2023-11-10 ENCOUNTER — Encounter (HOSPITAL_COMMUNITY): Payer: Self-pay | Admitting: *Deleted

## 2023-11-10 ENCOUNTER — Emergency Department (HOSPITAL_COMMUNITY)

## 2023-11-10 ENCOUNTER — Other Ambulatory Visit: Payer: Self-pay

## 2023-11-10 ENCOUNTER — Emergency Department (HOSPITAL_COMMUNITY): Admission: EM | Admit: 2023-11-10 | Discharge: 2023-11-11 | Disposition: A | Discharge status: Home / Self Care

## 2023-11-10 DIAGNOSIS — S51011A Laceration without foreign body of right elbow, initial encounter: Secondary | ICD-10-CM | POA: Diagnosis not present

## 2023-11-10 DIAGNOSIS — W010XXA Fall on same level from slipping, tripping and stumbling without subsequent striking against object, initial encounter: Secondary | ICD-10-CM | POA: Insufficient documentation

## 2023-11-10 DIAGNOSIS — E119 Type 2 diabetes mellitus without complications: Secondary | ICD-10-CM | POA: Diagnosis not present

## 2023-11-10 DIAGNOSIS — Y907 Blood alcohol level of 200-239 mg/100 ml: Secondary | ICD-10-CM | POA: Diagnosis not present

## 2023-11-10 DIAGNOSIS — Z8505 Personal history of malignant neoplasm of liver: Secondary | ICD-10-CM | POA: Insufficient documentation

## 2023-11-10 DIAGNOSIS — Z7984 Long term (current) use of oral hypoglycemic drugs: Secondary | ICD-10-CM | POA: Diagnosis not present

## 2023-11-10 DIAGNOSIS — S0990XA Unspecified injury of head, initial encounter: Secondary | ICD-10-CM | POA: Insufficient documentation

## 2023-11-10 DIAGNOSIS — S59901A Unspecified injury of right elbow, initial encounter: Secondary | ICD-10-CM | POA: Diagnosis present

## 2023-11-10 HISTORY — DX: Malignant (primary) neoplasm, unspecified: C80.1

## 2023-11-10 HISTORY — DX: Unspecified cirrhosis of liver: K74.60

## 2023-11-10 HISTORY — DX: Unspecified viral hepatitis C without hepatic coma: B19.20

## 2023-11-10 LAB — CBC WITH DIFFERENTIAL/PLATELET
Abs Immature Granulocytes: 0.02 10*3/uL (ref 0.00–0.07)
Basophils Absolute: 0.1 10*3/uL (ref 0.0–0.1)
Basophils Relative: 1 %
Eosinophils Absolute: 0.3 10*3/uL (ref 0.0–0.5)
Eosinophils Relative: 5 %
HCT: 46.1 % (ref 39.0–52.0)
Hemoglobin: 15.8 g/dL (ref 13.0–17.0)
Immature Granulocytes: 0 %
Lymphocytes Relative: 29 %
Lymphs Abs: 1.7 10*3/uL (ref 0.7–4.0)
MCH: 30.3 pg (ref 26.0–34.0)
MCHC: 34.3 g/dL (ref 30.0–36.0)
MCV: 88.3 fL (ref 80.0–100.0)
Monocytes Absolute: 0.4 10*3/uL (ref 0.1–1.0)
Monocytes Relative: 7 %
Neutro Abs: 3.3 10*3/uL (ref 1.7–7.7)
Neutrophils Relative %: 58 %
Platelets: 129 10*3/uL — ABNORMAL LOW (ref 150–400)
RBC: 5.22 MIL/uL (ref 4.22–5.81)
RDW: 12.6 % (ref 11.5–15.5)
WBC: 5.8 10*3/uL (ref 4.0–10.5)
nRBC: 0 % (ref 0.0–0.2)

## 2023-11-10 LAB — COMPREHENSIVE METABOLIC PANEL
ALT: 21 U/L (ref 0–44)
AST: 28 U/L (ref 15–41)
Albumin: 3.6 g/dL (ref 3.5–5.0)
Alkaline Phosphatase: 142 U/L — ABNORMAL HIGH (ref 38–126)
Anion gap: 12 (ref 5–15)
BUN: 9 mg/dL (ref 6–20)
CO2: 22 mmol/L (ref 22–32)
Calcium: 9.4 mg/dL (ref 8.9–10.3)
Chloride: 102 mmol/L (ref 98–111)
Creatinine, Ser: 0.84 mg/dL (ref 0.61–1.24)
GFR, Estimated: >60 mL/min (ref >60)
Glucose, Bld: 257 mg/dL — ABNORMAL HIGH (ref 70–99)
Potassium: 3.7 mmol/L (ref 3.5–5.1)
Sodium: 136 mmol/L (ref 135–145)
Total Bilirubin: 1 mg/dL (ref 0.0–1.2)
Total Protein: 8.1 g/dL (ref 6.5–8.1)

## 2023-11-10 LAB — URINALYSIS, ROUTINE W REFLEX MICROSCOPIC
Bilirubin Urine: NEGATIVE
Glucose, UA: 150 mg/dL — AB
Hgb urine dipstick: NEGATIVE
Ketones, ur: NEGATIVE mg/dL
Leukocytes,Ua: NEGATIVE
Nitrite: NEGATIVE
Protein, ur: NEGATIVE mg/dL
Specific Gravity, Urine: 1.003 — ABNORMAL LOW (ref 1.005–1.030)
pH: 6 (ref 5.0–8.0)

## 2023-11-10 LAB — GLUCOSE, CAPILLARY: Glucose-Capillary: 233 mg/dL — ABNORMAL HIGH (ref 70–99)

## 2023-11-10 LAB — ETHANOL: Alcohol, Ethyl (B): 206 mg/dL — ABNORMAL HIGH (ref <10)

## 2023-11-10 MED ORDER — LIDOCAINE-EPINEPHRINE (PF) 1 %-1:200000 IJ SOLN
10.0000 mL | Freq: Once | INTRAMUSCULAR | Status: AC
  Administered 2023-11-10: 10 mL
  Filled 2023-11-10: qty 30

## 2023-11-10 NOTE — ED Notes (Signed)
 Pt ambulated to restroom.

## 2023-11-10 NOTE — ED Provider Notes (Signed)
 [place] EMERGENCY DEPARTMENT AT [facility] Provider Note   CSN: [id number] Arrival date & time: 11/10/23  2001     History {Add pertinent medical, surgical, social history, OB history to HPI:1} Chief Complaint  Patient presents with   Danny [name] is a 58 y.o. male.  58 year old male with past medical history of liver cancer and diabetes presenting to the emergency department today after a mechanical fall.  The patient tripped and fell earlier today.  He did hit his head.  He states he is having a headache as well as neck pain with this.  He thinks he cut his elbow as well.  Is having pain there as well.  He denies any other injuries.  He states that he did lose consciousness after falling.  He denies any lightheadedness prior to this episode.  He came to the emergency department for further evaluation regarding this.   Fall Associated symptoms include headaches.       Home Medications Prior to Admission medications   Medication Sig Start Date End Date Taking? Authorizing Provider  Vitamin D, Ergocalciferol, (DRISDOL) 1.25 MG (50000 UNIT) CAPS capsule Take 1 capsule by mouth once a week. 06/29/23  Yes [provider]  albuterol (VENTOLIN HFA) 108 (90 Base) MCG/ACT inhaler Inhale 2 puffs into the lungs every 6 (six) hours as needed for wheezing or shortness of breath.    [provider]  ALPRAZolam Prudy Feeler) 0.25 MG tablet Take 0.25 mg by mouth at bedtime.    [provider]  Cholecalciferol (VITAMIN D-3) 125 MCG (5000 UT) TABS Take 5,000 Units by mouth daily.    [provider]  citalopram (CELEXA) 20 MG tablet Take 20 mg by mouth daily.    [provider]  cyclobenzaprine (FLEXERIL) 5 MG tablet Take 5 mg by mouth daily as needed for muscle spasms. 03/06/23   [provider]  gabapentin (NEURONTIN) 600 MG tablet Take 1 tablet (600 mg total) by mouth in the morning and at bedtime. 10/29/22   Vickki Hearing, MD  Melatonin 10 MG CAPS Take 10 mg by mouth at bedtime.    [provider]  metFORMIN (GLUCOPHAGE) 500 MG tablet Take 1 tablet (500 mg total) by mouth 2 (two) times daily with a meal. 01/01/18   Loren Racer, MD  naloxone [facility]) nasal spray 4 mg/0.1 mL Place 1 spray into the nose once. 01/01/22   [provider]  oxyCODONE-acetaminophen (PERCOCET) 10-325 MG tablet Take 1 tablet by mouth every 4 (four) hours as needed for pain. 04/03/23   Vickki Hearing, MD  pantoprazole (PROTONIX) 40 MG tablet Take 1 tablet (40 mg total) by mouth daily. 05/07/22 05/07/23  Emilie Rutter, PA-C  QUEtiapine (SEROQUEL) 100 MG tablet Take 100 mg by mouth at bedtime.    [provider]  rosuvastatin (CRESTOR) 10 MG tablet Take 1 tablet (10 mg total) by mouth daily. 05/06/22 05/06/23  Maeola Harman, MD  Sofosbuvir-Velpatasvir 400-100 MG TABS Take 1 tablet by mouth daily.    [provider]      Allergies    Trazodone, Ultram [tramadol], and Benadryl [diphenhydramine]    Review of Systems   Review of Systems  Musculoskeletal:  Positive for neck pain.  Neurological:  Positive for headaches.  All other systems reviewed and are negative.   Physical Exam Updated Vital Signs BP 139/78 (BP Location: Left Arm)   Pulse (!) 101   Temp 98.2 F (36.8 C) (  Oral)   Resp 16   SpO2 98%  Physical Exam Vitals and nursing note reviewed.   Gen: NAD Eyes: PERRL, EOMI HEENT: no oropharyngeal swelling Neck: trachea midline Resp: clear to auscultation bilaterally Card: RRR, no murmurs, rubs, or gallops Abd: nontender, nondistended Extremities: no calf tenderness, no edema, the patient has a 4 cm laceration over the right elbow, the patient is tender over the right patella and over the medial joint line with no obvious deformities noted.  The remainder of the extremities are atraumatic.  Compartments are soft. Vascular: 2+ radial pulses bilaterally, 2+ DP pulses  bilaterally Skin: no rashes Psyc: acting appropriately   ED Results / Procedures / Treatments   Labs (all labs ordered are listed, but only abnormal results are displayed) Labs Reviewed  CBC WITH DIFFERENTIAL/PLATELET - Abnormal; Notable for the following components:      Result Value   Platelets 129 (*)    All other components within normal limits  COMPREHENSIVE METABOLIC PANEL - Abnormal; Notable for the following components:   Glucose, Bld 257 (*)    Alkaline Phosphatase 142 (*)    All other components within normal limits  ETHANOL - Abnormal; Notable for the following components:   Alcohol, Ethyl (B) 206 (*)    All other components within normal limits  GLUCOSE, CAPILLARY - Abnormal; Notable for the following components:   Glucose-Capillary 233 (*)    All other components within normal limits  URINALYSIS, ROUTINE W REFLEX MICROSCOPIC    EKG None  Radiology No results found.  Procedures Procedures  {Document cardiac monitor, telemetry assessment procedure when appropriate:1}  Medications Ordered in ED Medications  lidocaine-EPINEPHrine (PF) (XYLOCAINE-EPINEPHrine) 1 %-1:200000 (PF) injection 10 mL (10 mLs Infiltration Given 11/10/23 2147)    ED Course/ Medical Decision Making/ A&P   {   Click here for ABCD2, HEART and other calculatorsREFRESH Note before signing :1}                              Medical Decision Making 58 year old male with past medical history of liver cancer and alcohol abuse presenting to emergency department today after a mechanical fall at home.  I will further evaluate him here with CT scan of his head and cervical spine in addition to an x-ray of his right elbow and knee for further evaluation for acute traumatic injuries.  I will repair his laceration.  Will update his tetanus.  He did have labs ordered at triage.  The patient denies any syncope and states that he did have a loss of consciousness after hitting his head so I think this is likely  more of a mechanical fall with concussion.  If his workup is unremarkable think he would be appropriate for discharge.  Risk Prescription drug management.   ***  {Document critical care time when appropriate:1} {Document review of labs and clinical decision tools ie heart score, Chads2Vasc2 etc:1}  {Document your independent review of radiology images, and any outside records:1} {Document your discussion with family members, caretakers, and with consultants:1} {Document social determinants of health affecting pt's care:1} {Document your decision making why or why not admission, treatments were needed:1} Final Clinical Impression(s) / ED Diagnoses Final diagnoses:  None    Rx / DC Orders ED Discharge Orders     None

## 2023-11-10 NOTE — ED Notes (Signed)
 POA CALLED STATING THAT PATIENT HAS SOME COGNITIVE ISSUES .  SHE STATES NUMBER IS IN HIS CHART IF SHE NEEDS TO BE REACHED TO EXPLAIN TO THE PATIENT WHAT IS GOING ON .

## 2023-11-10 NOTE — ED Provider Triage Note (Signed)
 Emergency Medicine Provider Triage Evaluation Note  Danny [name] , a 58 y.o. male  was evaluated in triage.  Pt complains of right elbow and knee pain following fall. Poor historian and was dropped off by family member. Reports he hit a sprinkler. Reports felt fine prior to fall. Family member stated "I had to call his name a couple times before he responded". Unknown LOC. Drank "one" beer today for first time in three months. Hx of cirrhosis   Review of Systems  Positive: See hpi Negative:   Physical Exam  There were no vitals taken for this visit. Gen:   Awake, no distress. A&Ox3 Resp:  Normal effort  MSK:   Moves extremities without difficulty  Other:  Lac and abrasion to right elbow, abrasion to right knee. Follows commands without difficulty. Appears neuro intact  Medical Decision Making  Medically screening exam initiated at 8:20 PM.  Appropriate orders placed.  Danny [name] was informed that the remainder of the evaluation will be completed by another provider, this initial triage assessment does not replace that evaluation, and the importance of remaining in the ED until their evaluation is complete.  Labs and imaging ordered   Danny [name], [place] 11/10/23 2022

## 2023-11-10 NOTE — ED Triage Notes (Addendum)
 Pt arrives, says he tripped and fell on a sprinkler system at his nieces house. The niece is POA and said he may have fell and hit a faucet, that he was face down and she had to call his names several times before he responded. She says he has a hx of liver cancer, cirrhosis, and hepatitis. He has a laceration to the right elbow, right knee and c/o headache and neck pain. C collar applied in triage. "1 beer today"

## 2023-11-11 MED ORDER — MUPIROCIN 2 % EX OINT
TOPICAL_OINTMENT | Freq: Two times a day (BID) | CUTANEOUS | Status: DC
  Filled 2023-11-11: qty 22

## 2023-11-11 NOTE — ED Notes (Signed)
 ED Provider at bedside.

## 2023-11-11 NOTE — Discharge Instructions (Signed)
 Please use antibiotic ointment on your wound on your knee as well as the elbow twice per day.  Please follow-up with your doctor in 10 to 14 days to have the sutures removed.  Return to the ER for worsening symptoms.

## 2023-11-12 ENCOUNTER — Ambulatory Visit (HOSPITAL_COMMUNITY)
Admission: RE | Admit: 2023-11-12 | Discharge: 2023-11-12 | Disposition: A | Discharge status: Home / Self Care | Payer: Medicaid Other | Source: Ambulatory Visit | Attending: Interventional Radiology | Admitting: Interventional Radiology

## 2023-11-12 DIAGNOSIS — C220 Liver cell carcinoma: Secondary | ICD-10-CM | POA: Insufficient documentation

## 2023-11-12 MED ORDER — GADOBUTROL 1 MMOL/ML IV SOLN
7.0000 mL | Freq: Once | INTRAVENOUS | Status: AC | PRN
  Administered 2023-11-12: 7 mL via INTRAVENOUS

## 2023-11-13 NOTE — Progress Notes (Signed)
 Referring Physician(s): Annamarie Major, NP, Earnest Bailey, DO   Chief Complaint: The patient is seen in virtual telephone follow up today s/p left hepatic TARE 10/25/21 and right Y90 radioembolization 03/14/22  History of present illness: HPI from last clinic visit 06/11/23 Danny [name] is a 58 y.o. male with history of alcoholic cirrhosis and multifocal hepatocellular carcinoma.  He had complained of right upper quadrant pain for approximately 1 year. Multiple imaging studies eventually led to a CT which demonstrated enhancing lesions in the left lobe of the liver. This prompted an MRI which demonstrated a 2.7 cm LIRADS 5 mass in segment II and LIRADS 3 lesion in the right dome. There was a 1.6 cm mass on prior CTA in segment III which was inconspicuous on MR, but highly suspicious for additional HCC. For this, he is status post left hepatic TARE on 10/25/21.  Approximately 50% of the dose was delivered in segmentectomy fashion to the index segment II hepatoma followed by delivery of the remaining in a lobar distribution.  Initial 3 month post-TARE MRI abdomen showed enlargement of right sided masses and good response of the left sided, treated masses. Therefore, he underwent additional mapping and then segment VIII radiation segmentectomy on 03/14/22.   He continues to overall do well. He endorses a good appetite. No jaundice or scleral icterus, nausea, vomiting.   Swollen legs for the past 3 days. He had surveillance MRI performed on 05/23/23.  Surveillance MRI 05/23/23 demonstrated a slightly enlarged LIRADS 5 segment IV mass. His a-FP had been stable and at our last visit we discussed next steps including observation or locoregional therapy, specifically transarterial chemoembolization. He elected to repeat an MRI in 3 months to assess any change before pursuing an additional procedure.  He presents today for follow up via virtual tele-health visit. He was evaluated in the ED 11/10/23 after he fell and  sustained a right elbow laceration requiring sutures. He feels well overall.  He last saw Annamarie Major, NP last week and was noted to have negative serology for hepatitis C.  His care has been transferred back to Dr. Marletta Lor.  No abdominal pain, nausea, vomiting.  He is tolerating a normal diet.    Past Medical History:  Diagnosis Date   Alcohol abuse    Anxiety    Back pain    Cancer (HCC)    Cirrhosis (HCC)    CVA (cerebral infarction)    Depression    Diabetes mellitus    Hepatitis C    Hypertension    Migraines    Stroke [facility])     Past Surgical History:  Procedure Laterality Date   ABDOMINAL AORTOGRAM W/LOWER EXTREMITY Right 05/06/2022   Procedure: ABDOMINAL AORTOGRAM W/LOWER EXTREMITY;  Surgeon: Maeola Harman, MD;  Location: [facility] INVASIVE CV LAB;  Service: Cardiovascular;  Laterality: Right;   CAST APPLICATION Right 11/02/2021   Procedure: CAST APPLICATION;  Surgeon: Vickki Hearing, MD;  Location: [facility] ORS;  Service: Orthopedics;  Laterality: Right;   ESOPHAGOGASTRODUODENOSCOPY (EGD) WITH PROPOFOL N/A 09/16/2022   Procedure: ESOPHAGOGASTRODUODENOSCOPY (EGD) WITH PROPOFOL;  Surgeon: Lanelle Bal, DO;  Location: [facility] ENDO SUITE;  Service: Endoscopy;  Laterality: N/A;  12:30pm, asa 3, pt knows to arrive at 6:45   FOOT SURGERY     INCISION AND DRAINAGE Right 11/02/2021   Procedure: INCISION AND DRAINAGE RIGHT KNEE;  Surgeon: Vickki Hearing, MD;  Location: [facility] ORS;  Service: Orthopedics;  Laterality: Right;   IR 3D INDEPENDENT WKST  10/11/2021  IR 3D INDEPENDENT WKST  02/28/2022   IR 3D INDEPENDENT WKST  03/14/2022   IR ANGIOGRAM SELECTIVE EACH ADDITIONAL VESSEL  10/11/2021   IR ANGIOGRAM SELECTIVE EACH ADDITIONAL VESSEL  10/11/2021   IR ANGIOGRAM SELECTIVE EACH ADDITIONAL VESSEL  10/11/2021   IR ANGIOGRAM SELECTIVE EACH ADDITIONAL VESSEL  10/11/2021   IR ANGIOGRAM SELECTIVE EACH ADDITIONAL VESSEL  10/11/2021   IR ANGIOGRAM SELECTIVE EACH ADDITIONAL VESSEL  10/11/2021   IR  ANGIOGRAM SELECTIVE EACH ADDITIONAL VESSEL  10/25/2021   IR ANGIOGRAM SELECTIVE EACH ADDITIONAL VESSEL  10/25/2021   IR ANGIOGRAM SELECTIVE EACH ADDITIONAL VESSEL  02/28/2022   IR ANGIOGRAM SELECTIVE EACH ADDITIONAL VESSEL  02/28/2022   IR ANGIOGRAM SELECTIVE EACH ADDITIONAL VESSEL  02/28/2022   IR ANGIOGRAM SELECTIVE EACH ADDITIONAL VESSEL  03/14/2022   IR ANGIOGRAM VISCERAL SELECTIVE  10/11/2021   IR ANGIOGRAM VISCERAL SELECTIVE  02/28/2022   IR ANGIOGRAM VISCERAL SELECTIVE  03/14/2022   IR EMBO TUMOR ORGAN ISCHEMIA INFARCT INC GUIDE ROADMAPPING  10/25/2021   IR EMBO TUMOR ORGAN ISCHEMIA INFARCT INC GUIDE ROADMAPPING  10/25/2021   IR EMBO TUMOR ORGAN ISCHEMIA INFARCT INC GUIDE ROADMAPPING  03/14/2022   IR RADIOLOGIST EVAL & MGMT  08/23/2021   IR RADIOLOGIST EVAL & MGMT  11/21/2021   IR RADIOLOGIST EVAL & MGMT  01/24/2022   IR RADIOLOGIST EVAL & MGMT  04/09/2022   IR RADIOLOGIST EVAL & MGMT  08/05/2022   IR RADIOLOGIST EVAL & MGMT  11/29/2022   IR RADIOLOGIST EVAL & MGMT  06/11/2023   IR US GUIDE VASC ACCESS RIGHT  10/11/2021   IR US GUIDE VASC ACCESS RIGHT  10/25/2021   IR US GUIDE VASC ACCESS RIGHT  02/28/2022   IR US GUIDE VASC ACCESS RIGHT  03/14/2022   MANDIBLE SURGERY     PERIPHERAL VASCULAR INTERVENTION Right 05/06/2022   Procedure: PERIPHERAL VASCULAR INTERVENTION;  Surgeon: Maeola Harman, MD;  Location: [facility] INVASIVE CV LAB;  Service: Cardiovascular;  Laterality: Right;  SFA   QUADRICEPS TENDON REPAIR Right 09/04/2021   Procedure: REPAIR QUADRICEP TENDON;  Surgeon: Vickki Hearing, MD;  Location: [facility] ORS;  Service: Orthopedics;  Laterality: Right;   TENDON EXPLORATION Right 11/02/2021   Procedure: QUADRICEPS TENDON AND PATELLAR TENDON EXPLORATION;  Surgeon: Vickki Hearing, MD;  Location: [facility] ORS;  Service: Orthopedics;  Laterality: Right;    Allergies: Trazodone, Ultram [tramadol], and Benadryl [diphenhydramine]  Medications: Prior to Admission medications   Medication Sig Start  Date End Date Taking? Authorizing Provider  albuterol (VENTOLIN HFA) 108 (90 Base) MCG/ACT inhaler Inhale 2 puffs into the lungs every 6 (six) hours as needed for wheezing or shortness of breath.    [provider]  ALPRAZolam Prudy Feeler) 0.25 MG tablet Take 0.25 mg by mouth at bedtime.    [provider]  Cholecalciferol (VITAMIN D-3) 125 MCG (5000 UT) TABS Take 5,000 Units by mouth daily.    [provider]  citalopram (CELEXA) 20 MG tablet Take 20 mg by mouth daily.    [provider]  cyclobenzaprine (FLEXERIL) 5 MG tablet Take 5 mg by mouth daily as needed for muscle spasms. 03/06/23   [provider]  gabapentin (NEURONTIN) 600 MG tablet Take 1 tablet (600 mg total) by mouth in the morning and at bedtime. 10/29/22   Vickki Hearing, MD  Melatonin 10 MG CAPS Take 10 mg by mouth at bedtime.    [provider]  metFORMIN (GLUCOPHAGE) 500 MG tablet Take 1 tablet (500 mg total) by  mouth 2 (two) times daily with a meal. 01/01/18   Loren Racer, MD  naloxone [facility]) nasal spray 4 mg/0.1 mL Place 1 spray into the nose once. 01/01/22   [provider]  oxyCODONE-acetaminophen (PERCOCET) 10-325 MG tablet Take 1 tablet by mouth every 4 (four) hours as needed for pain. 04/03/23   Vickki Hearing, MD  pantoprazole (PROTONIX) 40 MG tablet Take 1 tablet (40 mg total) by mouth daily. 05/07/22 05/07/23  Emilie Rutter, PA-C  QUEtiapine (SEROQUEL) 100 MG tablet Take 100 mg by mouth at bedtime.    [provider]  rosuvastatin (CRESTOR) 10 MG tablet Take 1 tablet (10 mg total) by mouth daily. 05/06/22 05/06/23  Maeola Harman, MD  Sofosbuvir-Velpatasvir 400-100 MG TABS Take 1 tablet by mouth daily.    [provider]  Vitamin D, Ergocalciferol, (DRISDOL) 1.25 MG (50000 UNIT) CAPS capsule Take 1 capsule by mouth once a week. 06/29/23   [provider]     Family History  Problem Relation Age of Onset   Heart  failure Mother    Stroke Father    Stroke Sister    Cancer Sister        breast cancer   Colon cancer Neg Hx    Liver cancer Neg Hx     Social History   Socioeconomic History   Marital status: Divorced    Spouse name: Not on file   Number of children: Not on file   Years of education: Not on file   Highest education level: Not on file  Occupational History   Not on file  Tobacco Use   Smoking status: Every Day    Current packs/day: 0.50    Average packs/day: 0.5 packs/day for 20.0 years (10.0 ttl pk-yrs)    Types: Cigarettes    Passive exposure: Current   Smokeless tobacco: Never  Vaping Use   Vaping status: Never Used  Substance and Sexual Activity   Alcohol use: Not Currently    Comment: Quit 11 months ago. Used to drink about 6 pack a day. (documented 06/06/21)   Drug use: Yes    Types: Marijuana    Comment: occas   Sexual activity: Yes    Birth control/protection: None  Other Topics Concern   Not on file  Social History Narrative   Not on file   Social Drivers of Health   Financial Resource Strain: Not on file  Food Insecurity: Low Risk  (01/21/2023)   [facility]   Hunger Vital Sign    Worried About Running Out of Food in the Last Year: Never true    Ran Out of Food in the Last Year: Never true  Transportation Needs: Not on file (01/21/2023)  Physical Activity: Not on file  Stress: Not on file  Social Connections: Not on file     Vital Signs: There were no vitals taken for this visit.  No physical exam was performed in lieu of virtual telephone visit.    Imaging:  Mapping study 02/28/22     Post Seg VIII TARE (03/14/22)    MR Abdomen 07/18/22  Post-TARE changes in left lobe and segment VIII, no focal enhancing lesions - LIRADS TR-non viable.   MR Abdomen 11/27/22  Post-TARE changes in left lobe and segment VIII, no focal enhancing lesions - LIRADS TR-non viable.   MR abdomen 05/23/23  Post-TARE changes in left  lobe and segment VIII.  New segment IV 1.7 cm enhancing mass with washout,  previously 1.5 cm = LIRADS 5  MRI abdomen 11/12/23 5 LIRADS 3/4 lesions +/- 1 cm, all less than 2 cm LIRADS 5 lesion in segment IV, 1.7 cm Post Y90 changes in segment VIII.   Labs:  CBC: Recent Labs    11/10/23 2109  WBC 5.8  HGB 15.8  HCT 46.1  PLT 129*    COAGS: No results for input(s): "INR", "APTT" in the last 8760 hours.  BMP: Recent Labs    11/10/23 2109  NA 136  K 3.7  CL 102  CO2 22  GLUCOSE 257*  BUN 9  CALCIUM 9.4  CREATININE 0.84  GFRNONAA >60    LIVER FUNCTION TESTS: Recent Labs    11/10/23 2109  BILITOT 1.0  AST 28  ALT 21  ALKPHOS 142*  PROT 8.1  ALBUMIN 3.6         Assessment and Plan: 58 year old male with history of hepatitis C/alcoholic cirrhosis (Child Pugh A, ALBI 2) and multifocal, bilobar hepatocellular carcinoma now status post left lobar hepatic transarterial radioembolization on 10/25/21 and segment VIII radiation segmentectomy on 03/14/22.  His hepatitis C is now treated.  Imaging demonstrates slight global increase in multifocal bilobar hepatocellular carcinoma with a single LIRADS 5 lesion in segment IV and mutlifocal LIRADS 3-4 lesions in the right lobe.  He has a concordant slight increase in a-FP.   We discussed treatment options including continuing to monitor and pursuing liver directed therapy such as transarterial chemoembolization.  He would like to pursue chemoembolization.  He has not been followed by Oncology, so we will get him connected with Dr. Ellin Saba to consider adjuvant immunotherapy.  -referral to [facility] in [place] -plan for hepatic angiogram and conventional transarterial chemoembolization at [facility] with moderate sedation, right common femoral artery approach   Marliss Coots, MD Pager: [phone]    I spent a total of 40 Minutes in virtual telephone clinical consultation, greater than 50% of which was  counseling/coordinating care for hepatocellular carcinoma.

## 2023-11-17 ENCOUNTER — Ambulatory Visit
Admission: RE | Admit: 2023-11-17 | Discharge: 2023-11-17 | Disposition: A | Discharge status: Home / Self Care | Payer: Medicaid Other | Source: Ambulatory Visit | Attending: Interventional Radiology

## 2023-11-17 DIAGNOSIS — C220 Liver cell carcinoma: Secondary | ICD-10-CM

## 2023-11-17 HISTORY — PX: IR RADIOLOGIST EVAL & MGMT: IMG5224

## 2023-11-21 ENCOUNTER — Inpatient Hospital Stay: Attending: Oncology | Admitting: Oncology

## 2023-11-21 ENCOUNTER — Encounter: Payer: Self-pay | Admitting: Oncology

## 2023-11-21 ENCOUNTER — Inpatient Hospital Stay

## 2023-11-21 VITALS — BP 143/73 | HR 100 | Temp 98.3°F | Resp 18 | Wt 173.7 lb

## 2023-11-21 DIAGNOSIS — C220 Liver cell carcinoma: Secondary | ICD-10-CM | POA: Diagnosis not present

## 2023-11-21 DIAGNOSIS — Z7984 Long term (current) use of oral hypoglycemic drugs: Secondary | ICD-10-CM | POA: Insufficient documentation

## 2023-11-21 DIAGNOSIS — F17210 Nicotine dependence, cigarettes, uncomplicated: Secondary | ICD-10-CM | POA: Insufficient documentation

## 2023-11-21 DIAGNOSIS — E119 Type 2 diabetes mellitus without complications: Secondary | ICD-10-CM | POA: Insufficient documentation

## 2023-11-21 DIAGNOSIS — I10 Essential (primary) hypertension: Secondary | ICD-10-CM | POA: Diagnosis not present

## 2023-11-21 NOTE — Progress Notes (Signed)
 Hematology-Oncology Clinic Note  Marylynn Pearson, FNP   Reason for Referral: Hepatocellular carcinoma  Oncology History: I have reviewed his chart and materials related to his cancer extensively and collaborated history with the patient. Summary of oncologic history is as follows: Oncology History  Hepatocellular carcinoma (HCC)  06/29/2021 Imaging   MRI liver with and without contrast:  Arterially hyperenhancing mass of the central posterior left lobe of the liver, hepatic segment II/III, measuring 2.7 x 2.1 cm, with some evidence of washout and capsular enhancement. LI-RADS category 5, consistent with hepatocellular carcinoma. 2. Additional subtle arterially hyperenhancing lesion of the central liver dome, hepatic segment VIII, measuring 1.1 cm, without evidence of washout or capsule. LI-RADS category 3, intermediate suspicion for hepatocellular carcinoma. Attention on follow-up.   06/29/2021 Initial Diagnosis   Hepatocellular carcinoma (HCC)   01/18/2022 Imaging   MRI abdomen with and without contrast:   IMPRESSION: Decreased size and lack of internal enhancement of treated mass in segment 3 of the left lobe. (LR-TR Nonviable)   Increased size of 15 mm hypervascular lesion in the liver dome. (LI-RADS Category 5: Definitely HCC)   9 mm hypervascular lesion near the junction of the right and left lobes, not seen on prior exam. (LI-RADS Category 3: Intermediate probability of malignancy)   No evidence of abdominal metastatic disease.   Stable splenomegaly and portosystemic collaterals, consistent with portal venous hypertension.   03/14/2022 Procedure   Segment 8 radiation segmentectomy- Right Y90 embolization   07/18/2022 Imaging   MRI abdomen with and without contrast:  1. Low-level, diffuse hyperenhancement of the left lobe of the liver as well as hepatic segment VIII with associated atrophy, but without suspicious focal hyperenhancement. Findings are consistent treated  multifocal hepatocellular carcinoma without evidence of residual viable disease. LI-RADS TR, nonviable. 2. No new liver lesions. 3. No evidence of lymphadenopathy or metastatic disease in the abdomen. 4. Splenomegaly and large splenic varices.   08/24/2022 Procedure   Left hepatic TARE   05/23/2023 Imaging   MRI abdomen with and without contrast:  IMPRESSION: 1. Unchanged post treatment atrophy of the left lobe of the liver as well as hepatic segment VIII, with associated post treatment hyperemia following radio embolization. No suspicious associated contrast enhancement. LI-RADS TR, nonviable. 2. Unchanged, somewhat ill-defined arterial hyperenhancement in hepatic segment IVB measuring 1.7 x 1.5 cm, on today's examination with some evidence of heterogeneous internal washout, but without evidence of capsular enhancement. This is technically characterized as LI-RADS category 4, suspicious for an additional focus of hepatocellular carcinoma and warrants close attention on follow-up. 3. Multiple additional unchanged arterially hyperenhancing lesions scattered throughout the liver measuring up to 1.3 cm, which were new on prior examination dated 11/27/2022 but unchanged in the immediate interval. These may reflect dystrophic or regenerative nodularity or alternately additional foci of hepatocellular carcinoma, LI-RADS category 3, intermediate suspicion for hepatocellular carcinoma. Recommend follow-up in 6 months. 4. Cirrhosis and stigmata portal hypertension including splenomegaly and varices.    05/23/2023 Tumor Marker   AFP: 7.0   11/12/2023 Imaging   MRI abdomen with and without contrast:  IMPRESSION: 1. Redemonstration of multiple liver lesions, as described above. 2. There is a 1.2 x 1.4 cm LI-RADS 5 lesion in the left hepatic lobe, segment 4B, which is slightly decreased in size since the prior study. 3. There are 2, LI-RADS 5 lesions in the right hepatic dome, which are slightly increased  in size since the prior study. 4. There are 3, LI-RADS 4 lesions in the right  hepatic lobe, segments 6/7, which are grossly stable. 5. There is posttreatment cavity in the right hepatic dome, at the junctions of segment 8 and 4A a measuring approximately 2.1 x 2.2 cm. The cavity exhibits peripheral irregular enhancing wall which exhibits washout on delayed/equilibrium phase images, favoring viable tumor. 6. No new liver lesion seen.   11/12/2023 Cancer Staging   Staging form: Liver, AJCC 8th Edition - Clinical stage from 11/12/2023: Stage IB (rcT1b, cN0, cM0) - Signed by Cindie Crumbly, MD on 11/21/2023 Stage prefix: Recurrence       History of Presenting Illness: Danny [name] 58 y.o. male with a history of hepatocellular carcinoma initially diagnosed in October 2022 s/p TARE and Y90 treatment, was referred to [place] for recurrence of disease.  Patient was accompanied by his niece today who is very involved in his health care.  Patient reported that he has no complaints today and has been doing very well.The patient is aware of his diagnosis and is currently feeling "pretty good" with no significant complaints. He has a history of Hepatitis C, which has been cleared, and cirrhosis of the liver.  He recently started following with Dr. Marletta Lor for his hepatitis C.  Patient is already referred for chemoembolization at this time.    Discussed with the patient the role of adjuvant immunotherapy.  Also discussed that patient has local disease at this time and chemoembolization is the preferred treatment choice.  If the posttreatment MRI shows residual disease, can discuss at that time if the patient needs further local treatments like radiation or systemic treatment with immunotherapy.  Patient has no other complaints today and has been doing pretty well.   The patient is a former alcohol user, having quit approximately five years ago, and is a current smoker.   Medical History: Past Medical History:   Diagnosis Date   Alcohol abuse    Anxiety    Back pain    Cancer (HCC)    Cirrhosis (HCC)    CVA (cerebral infarction)    Depression    Diabetes mellitus    Hepatitis C    Hypertension    Migraines    Stroke [facility])     Surgical history: Past Surgical History:  Procedure Laterality Date   ABDOMINAL AORTOGRAM W/LOWER EXTREMITY Right 05/06/2022   Procedure: ABDOMINAL AORTOGRAM W/LOWER EXTREMITY;  Surgeon: Maeola Harman, MD;  Location: [facility] INVASIVE CV LAB;  Service: Cardiovascular;  Laterality: Right;   CAST APPLICATION Right 11/02/2021   Procedure: CAST APPLICATION;  Surgeon: Vickki Hearing, MD;  Location: [facility] ORS;  Service: Orthopedics;  Laterality: Right;   ESOPHAGOGASTRODUODENOSCOPY (EGD) WITH PROPOFOL N/A 09/16/2022   Procedure: ESOPHAGOGASTRODUODENOSCOPY (EGD) WITH PROPOFOL;  Surgeon: Lanelle Bal, DO;  Location: [facility] ENDO SUITE;  Service: Endoscopy;  Laterality: N/A;  12:30pm, asa 3, pt knows to arrive at 6:45   FOOT SURGERY     INCISION AND DRAINAGE Right 11/02/2021   Procedure: INCISION AND DRAINAGE RIGHT KNEE;  Surgeon: Vickki Hearing, MD;  Location: [facility] ORS;  Service: Orthopedics;  Laterality: Right;   IR 3D INDEPENDENT WKST  10/11/2021   IR 3D INDEPENDENT WKST  02/28/2022   IR 3D INDEPENDENT WKST  03/14/2022   IR ANGIOGRAM SELECTIVE EACH ADDITIONAL VESSEL  10/11/2021   IR ANGIOGRAM SELECTIVE EACH ADDITIONAL VESSEL  10/11/2021   IR ANGIOGRAM SELECTIVE EACH ADDITIONAL VESSEL  10/11/2021   IR ANGIOGRAM SELECTIVE EACH ADDITIONAL VESSEL  10/11/2021   IR ANGIOGRAM SELECTIVE EACH ADDITIONAL VESSEL  10/11/2021  IR ANGIOGRAM SELECTIVE EACH ADDITIONAL VESSEL  10/11/2021   IR ANGIOGRAM SELECTIVE EACH ADDITIONAL VESSEL  10/25/2021   IR ANGIOGRAM SELECTIVE EACH ADDITIONAL VESSEL  10/25/2021   IR ANGIOGRAM SELECTIVE EACH ADDITIONAL VESSEL  02/28/2022   IR ANGIOGRAM SELECTIVE EACH ADDITIONAL VESSEL  02/28/2022   IR ANGIOGRAM SELECTIVE EACH ADDITIONAL VESSEL  02/28/2022   IR ANGIOGRAM  SELECTIVE EACH ADDITIONAL VESSEL  03/14/2022   IR ANGIOGRAM VISCERAL SELECTIVE  10/11/2021   IR ANGIOGRAM VISCERAL SELECTIVE  02/28/2022   IR ANGIOGRAM VISCERAL SELECTIVE  03/14/2022   IR EMBO TUMOR ORGAN ISCHEMIA INFARCT INC GUIDE ROADMAPPING  10/25/2021   IR EMBO TUMOR ORGAN ISCHEMIA INFARCT INC GUIDE ROADMAPPING  10/25/2021   IR EMBO TUMOR ORGAN ISCHEMIA INFARCT INC GUIDE ROADMAPPING  03/14/2022   IR RADIOLOGIST EVAL & MGMT  08/23/2021   IR RADIOLOGIST EVAL & MGMT  11/21/2021   IR RADIOLOGIST EVAL & MGMT  01/24/2022   IR RADIOLOGIST EVAL & MGMT  04/09/2022   IR RADIOLOGIST EVAL & MGMT  08/05/2022   IR RADIOLOGIST EVAL & MGMT  11/29/2022   IR RADIOLOGIST EVAL & MGMT  06/11/2023   IR RADIOLOGIST EVAL & MGMT  11/17/2023   IR US GUIDE VASC ACCESS RIGHT  10/11/2021   IR US GUIDE VASC ACCESS RIGHT  10/25/2021   IR US GUIDE VASC ACCESS RIGHT  02/28/2022   IR US GUIDE VASC ACCESS RIGHT  03/14/2022   MANDIBLE SURGERY     PERIPHERAL VASCULAR INTERVENTION Right 05/06/2022   Procedure: PERIPHERAL VASCULAR INTERVENTION;  Surgeon: Maeola Harman, MD;  Location: [facility] INVASIVE CV LAB;  Service: Cardiovascular;  Laterality: Right;  SFA   QUADRICEPS TENDON REPAIR Right 09/04/2021   Procedure: REPAIR QUADRICEP TENDON;  Surgeon: Vickki Hearing, MD;  Location: [facility];  Service: Orthopedics;  Laterality: Right;   TENDON EXPLORATION Right 11/02/2021   Procedure: QUADRICEPS TENDON AND PATELLAR TENDON EXPLORATION;  Surgeon: Vickki Hearing, MD;  Location: [facility] OR[facility];  Service: Orthopedics;  Laterality: Right;    Social History: Social History   Socioeconomic History   Marital status: Divorced    Spouse name: Not on file   Number of children: Not on file   Years of education: Not on file   Highest education level: Not on file  Occupational History   Not on file  Tobacco Use   Smoking status: Every Day    Current packs/day: 0.50    Average packs/day: 0.5 packs/day for 20.0 years (10.0 ttl pk-yrs)    Types:  Cigarettes    Passive exposure: Current   Smokeless tobacco: Never  Vaping Use   Vaping status: Never Used  Substance and Sexual Activity   Alcohol use: Not Currently    Comment: Quit 11 months ago. Used to drink about 6 pack a day. (documented 06/06/21)   Drug use: Yes    Types: Marijuana    Comment: occas   Sexual activity: Yes    Birth control/protection: None  Other Topics Concern   Not on file  Social History Narrative   Not on file   Social Drivers of Health   Financial Resource Strain: Not on file  Food Insecurity: No Food Insecurity (11/21/2023)   Hunger Vital Sign    Worried About Running Out of Food in the Last Year: Never true    Ran Out of Food in the Last Year: Never true  Transportation Needs: No Transportation Needs (11/21/2023)   PRAPARE - Administrator, Civil Service (Medical): No  Lack of Transportation (Non-Medical): No  Physical Activity: Not on file  Stress: Not on file  Social Connections: Not on file  Intimate Partner Violence: Not At Risk (11/21/2023)   Humiliation, Afraid, Rape, and Kick questionnaire    Fear of Current or Ex-Partner: No    Emotionally Abused: No    Physically Abused: No    Sexually Abused: No    Family History: Family History  Problem Relation Age of Onset   Heart failure Mother    Stroke Father    Stroke Sister    Cancer Sister        breast cancer   Colon cancer Neg Hx    Liver cancer Neg Hx     Allergies:  is allergic to ultram [tramadol], benadryl [diphenhydramine], and trazodone.  Medications:  Current Outpatient Medications  Medication Sig Dispense Refill   albuterol (VENTOLIN HFA) 108 (90 Base) MCG/ACT inhaler Inhale 2 puffs into the lungs every 6 (six) hours as needed for wheezing or shortness of breath.     ALPRAZolam (XANAX) 0.25 MG tablet Take 0.25 mg by mouth at bedtime.     Cholecalciferol (VITAMIN D-3) 125 MCG (5000 UT) TABS Take 5,000 Units by mouth daily.     citalopram (CELEXA) 20 MG  tablet Take 20 mg by mouth daily.     cyclobenzaprine (FLEXERIL) 5 MG tablet Take 5 mg by mouth daily as needed for muscle spasms.     gabapentin (NEURONTIN) 600 MG tablet Take 1 tablet (600 mg total) by mouth in the morning and at bedtime. 180 tablet 1   Melatonin 10 MG CAPS Take 10 mg by mouth at bedtime.     metFORMIN (GLUCOPHAGE) 500 MG tablet Take 1 tablet (500 mg total) by mouth 2 (two) times daily with a meal. 60 tablet 2   mirtazapine (REMERON) 7.5 MG tablet Take 1 tablet every day by oral route for 90 days.     naloxone (NARCAN) nasal spray 4 mg/0.1 mL Place 1 spray into the nose once.     oxyCODONE-acetaminophen (PERCOCET) 10-325 MG tablet Take 1 tablet by mouth every 4 (four) hours as needed for pain. 30 tablet 0   QUEtiapine (SEROQUEL) 100 MG tablet Take 100 mg by mouth at bedtime.     Sofosbuvir-Velpatasvir 400-100 MG TABS Take 1 tablet by mouth daily.     Vitamin D, Ergocalciferol, (DRISDOL) 1.25 MG (50000 UNIT) CAPS capsule Take 1 capsule by mouth once a week.     pantoprazole (PROTONIX) 40 MG tablet Take 1 tablet (40 mg total) by mouth daily. 30 tablet 2   rosuvastatin (CRESTOR) 10 MG tablet Take 1 tablet (10 mg total) by mouth daily. 30 tablet 11   No current facility-administered medications for this visit.    Review of Systems: Constitutional: Denies fevers, chills or abnormal night sweats Eyes: Denies blurriness of vision, double vision or watery eyes Ears, nose, mouth, throat, and face: Denies mucositis or sore throat Respiratory: Denies cough, dyspnea or wheezes Cardiovascular: Denies palpitation, chest discomfort or lower extremity swelling Gastrointestinal:  Denies nausea, heartburn or change in bowel habits Skin: Denies abnormal skin rashes Lymphatics: Denies new lymphadenopathy or easy bruising Neurological:Denies numbness, tingling or new weaknesses Behavioral/Psych: Mood is stable, no new changes  All other systems were reviewed with the patient and are  negative.  Physical Examination: ECOG PERFORMANCE STATUS: 0 - Asymptomatic  Vitals:   11/21/23 1011  BP: (!) 143/73  Pulse: 100  Resp: 18  Temp: 98.3 F (36.8 C)  SpO2: 97%   Filed Weights   11/21/23 1011  Weight: 173 lb 11.6 oz (78.8 kg)    GENERAL:alert, no distress and comfortable LUNGS: clear to auscultation and percussion with normal breathing effort HEART: regular rate & rhythm and no murmurs and no lower extremity edema ABDOMEN:abdomen soft, non-tender and normal bowel sounds Musculoskeletal:no cyanosis of digits and no clubbing  PSYCH: alert & oriented x 3 with fluent speech   Laboratory Data: I have reviewed the data as listed Lab Results  Component Value Date   WBC 5.8 11/10/2023   HGB 15.8 11/10/2023   HCT 46.1 11/10/2023   MCV 88.3 11/10/2023   PLT 129 (L) 11/10/2023   Recent Labs    11/10/23 2109  NA 136  K 3.7  CL 102  CO2 22  GLUCOSE 257*  BUN 9  CREATININE 0.84  CALCIUM 9.4  GFRNONAA >60  PROT 8.1  ALBUMIN 3.6  AST 28  ALT 21  ALKPHOS 142*  BILITOT 1.0    Latest Reference Range & Units 05/23/23 10:32  AFP Tumor Marker <6.1 ng/mL 7.0 (H)  (H): Data is abnormally high  Radiographic Studies: I have personally reviewed the radiological images as listed and agreed with the findings in the report.  MR ABDOMEN WWO CONTRAST Result Date: 11/21/2023 CLINICAL DATA:  Follow-up of hepatocellular carcinoma. EXAM: MRI ABDOMEN WITHOUT AND WITH CONTRAST TECHNIQUE: Multiplanar multisequence MR imaging of the abdomen was performed both before and after the administration of intravenous contrast. CONTRAST:  7mL GADAVIST GADOBUTROL 1 MMOL/ML IV SOLN COMPARISON:  MRI abdomen from 05/23/2023. FINDINGS: Lower chest: Unremarkable MR appearance to the lung bases. No pleural effusion. No pericardial effusion. Normal heart size. Hepatobiliary: The liver is normal in size. Redemonstration of post treatment atrophy of the left hepatic lobe as well as right hepatic  lobe, segment 8 surrounding the posttreatment cavity which exhibit delayed homogeneous enhancement and mildly increased T2 hyperintensity, compatible with post treatment arterioportal shunting/hepatic fibrosis. Redemonstration of multiple focal liver lesions, described in detail below: *Redemonstration of posttreatment cavity in the right hepatic dome, at the junctions of segment 8 and 4A, measuring approximately 2.1 x 2.2 cm. The cavity exhibits peripheral irregular enhancing wall which exhibits washout on delayed/equilibrium phase images. *There are 2, adjacent arterial hyperenhancing lesions in the right hepatic dome (series 12, images 8 and 11), which exhibit non peripheral washout. The lesion measures 1.3 x 1.6 cm (image 8, which previously measured 11 x 14 mm) and 13 x 14 mm (image 11, previously measured 10 x 13 mm). These are LI-RADS 5 lesion. *There are 3, adjacent, arterially hyperenhancing lesions along the capsule of the right hepatic lobe, segments 6/7 (Series 12, images 24, 28 and 35), which exhibit washout on the delayed images. No significant interval change in the size of the lesions. For example, the largest of this lesion (on image 24), measures 7 x 8 mm, previously 8 x 8 mm. These are LI-RADS 4 lesion. *There is a 1.2 x 1.4 cm arterially hyperenhancing lesion in the left hepatic lobe, segment 4B measuring 1.2 x 1.4 cm (series 12, image 32), which exhibit washout on the delayed images. The lesion previously measured 1.5 x 1.6 cm. This is a LI-RADS 5 lesion. *There is a 8 x 9 mm arterial hyperenhancing lesion in the right hepatic lobe, segment 5 (series 12, image 56), which exhibits washout on the delayed images. The lesion previously measured 6 by 9 mm, when measured in similar fashion. This is a TI-RADS 4 lesion.  No other new focal liver lesion seen. No intrahepatic or extrahepatic bile duct dilatation. No choledocholithiasis. Status post cholecystectomy. Pancreas: No mass, inflammatory changes  or other parenchymal abnormality identified. No main pancreatic duct dilation. Spleen: Spleen is enlarged measuring up to 6.0 x 17.5 cm, slightly decreased in size since the prior study. No focal mass. There are apparent hypointense rim containing areas in the spleen on the arterial phase images, which are due to phase of imaging. Adrenals/Urinary Tract: Unremarkable adrenal glands. No hydroureteronephrosis. No suspicious renal mass. Stable exophytic cyst arising from the left kidney lower pole, posteriorly. Stomach/Bowel: Visualized portions within the abdomen are unremarkable. No disproportionate dilation of bowel loops. Vascular/Lymphatic: No pathologically enlarged lymph nodes identified. No abdominal aortic aneurysm demonstrated. Moderate atherosclerotic vascular calcifications of aorta. No ascites. There multiple venous collaterals in the left upper abdomen, posteriorly, suggesting sequela of portal hypertension. Other:  None. Musculoskeletal: No suspicious bone lesions identified. IMPRESSION: 1. Redemonstration of multiple liver lesions, as described above. 2. There is a 1.2 x 1.4 cm LI-RADS 5 lesion in the left hepatic lobe, segment 4B, which is slightly decreased in size since the prior study. 3. There are 2, LI-RADS 5 lesions in the right hepatic dome, which are slightly increased in size since the prior study. 4. There are 3, LI-RADS 4 lesions in the right hepatic lobe, segments 6/7, which are grossly stable. 5. There is posttreatment cavity in the right hepatic dome, at the junctions of segment 8 and 4A a measuring approximately 2.1 x 2.2 cm. The cavity exhibits peripheral irregular enhancing wall which exhibits washout on delayed/equilibrium phase images, favoring viable tumor. 6. No new liver lesion seen. 7. Multiple other nonacute observations, as described above. Electronically Signed   By: Jules Schick M.D.   On: 11/21/2023 12:04   IR Radiologist Eval & Mgmt Result Date: 11/17/2023 EXAM:  ESTABLISHED PATIENT OFFICE VISIT CHIEF COMPLAINT: See Epic note. HISTORY OF PRESENT ILLNESS: See Epic note. REVIEW OF SYSTEMS: See Epic note. PHYSICAL EXAMINATION: See Epic note. ASSESSMENT AND PLAN: See Epic note. Marliss Coots, MD [facility] Radiology Electronically Signed   By: Marliss Coots M.D.   On: 11/17/2023 08:51   CT Cervical Spine Wo Contrast Result Date: 11/10/2023 CLINICAL DATA:  Status post fall. EXAM: CT CERVICAL SPINE WITHOUT CONTRAST TECHNIQUE: Multidetector CT imaging of the cervical spine was performed without intravenous contrast. Multiplanar CT image reconstructions were also generated. RADIATION DOSE REDUCTION: This exam was performed according to the departmental dose-optimization program which includes automated exposure control, adjustment of the mA and/or kV according to patient size and/or use of iterative reconstruction technique. COMPARISON:  October 05, 2014 FINDINGS: Alignment: There is straightening of the normal cervical spine lordosis. Skull base and vertebrae: No acute fracture. No primary bone lesion or focal pathologic process. Soft tissues and spinal canal: No prevertebral fluid or swelling. No visible canal hematoma. Disc levels: Marked severity anterior osteophyte formation is seen at the levels of C5-C6 and C6-C7. Mild anterior osteophyte formation is also present at C3-C4. Mild multilevel intervertebral disc space narrowing is present. Bilateral moderate to marked severity multilevel facet joint hypertrophy is noted. Upper chest: Negative. Other: None. IMPRESSION: 1. No acute fracture or subluxation in the cervical spine. 2. Multilevel degenerative changes, as described above. Electronically Signed   By: Aram Candela M.D.   On: 11/10/2023 23:28   CT Head Wo Contrast Result Date: 11/10/2023 CLINICAL DATA:  Status post fall. EXAM: CT HEAD WITHOUT CONTRAST TECHNIQUE: Contiguous  axial images were obtained from the base of  the skull through the vertex without intravenous contrast. RADIATION DOSE REDUCTION: This exam was performed according to the departmental dose-optimization program which includes automated exposure control, adjustment of the mA and/or kV according to patient size and/or use of iterative reconstruction technique. COMPARISON:  April 26, 2019 FINDINGS: Brain: No evidence of acute infarction, hemorrhage, hydrocephalus, extra-axial collection or mass lesion/mass effect. Vascular: No hyperdense vessel or unexpected calcification. Skull: Normal. Negative for fracture or focal lesion. Sinuses/Orbits: No acute finding. Other: None. IMPRESSION: No acute intracranial pathology. Electronically Signed   By: Aram Candela M.D.   On: 11/10/2023 23:25   DG Elbow Complete Right Result Date: 11/10/2023 CLINICAL DATA:  Laceration and abrasion.  Fall. EXAM: RIGHT ELBOW - COMPLETE 3+ VIEW COMPARISON:  None Available. FINDINGS: There is no evidence of fracture, dislocation, or joint effusion. Tiny olecranon spur. No erosive change. Dressing overlies the posterior elbow. No radiopaque foreign body. IMPRESSION: Dressing overlies the posterior elbow. No radiopaque foreign body or acute osseous findings. Electronically Signed   By: Narda Rutherford M.D.   On: 11/10/2023 22:30   DG Knee Complete 4 Views Right Result Date: 11/10/2023 CLINICAL DATA:  Laceration and abrasion.  Fall. EXAM: RIGHT KNEE - COMPLETE 4+ VIEW COMPARISON:  02/28/2023 FINDINGS: No acute fracture. There is patellar [place], chronic. Anchor in the upper patella. The bones are subjectively under mineralized. No convincing joint effusion. Anterior soft tissue edema. There are vascular calcifications. No radiopaque foreign body. IMPRESSION: 1. No fracture or dislocation. Chronic patellar [place] and surgical anchors in the patella. 2. Anterior soft tissue edema. Electronically Signed   By: Narda Rutherford M.D.   On: 11/10/2023 22:28    ASSESSMENT & PLAN:  Patient is a  58 year old male referred for recurrence of local hepatocellular carcinoma   Hepatocellular carcinoma (HCC) Patient has a history of hepatocellular carcinoma initially diagnosed in 10/22.  S/p TARE and Y90 in 03/14/2022 and 08/24/2022 respectively.  Recent MRI with evidence of disease.  Patient is already referred for chemoembolization.  Patient is not a candidate for liver transplant secondary to cirrhosis and hepatitis C. -Continue with [facility] appointment for chemoembolization -Will repeat an MRI after that to assess for response.  If patient does have residual disease we will consider radiation referral at that time versus systemic immunotherapy. -Discussed that the role of adjuvant immunotherapy according to [id number] is limited in his condition as the study did not show any improvement in overall survival and only some improvement in PFS was noted. -Continue to follow with Dr. Marletta Lor for hepatitis C.  Return to clinic in 3 months after chemoembolization and MRI of abdomen to assess for response and discuss further management.  All questions and concerns were answered in detail.   The total time spent in the appointment was 60 minutes encounter with patients including review of chart and various tests results, discussions about plan of care and coordination of care plan   All questions were answered. The patient knows to call the clinic with any problems, questions or concerns. No barriers to learning was detected.  Cindie Crumbly, MD 3/14/20252:54 PM

## 2023-11-21 NOTE — Assessment & Plan Note (Signed)
 Patient has a history of hepatocellular carcinoma initially diagnosed in 10/22.  S/p TARE and Y90 in 03/14/2022 and 08/24/2022 respectively.  Recent MRI with evidence of disease.  Patient is already referred for chemoembolization.  Patient is not a candidate for liver transplant secondary to cirrhosis and hepatitis C. -Continue with [facility] appointment for chemoembolization -Will repeat an MRI after that to assess for response.  If patient does have residual disease we will consider radiation referral at that time versus systemic immunotherapy. -Discussed that the role of adjuvant immunotherapy according to [id number] is limited in his condition as the study did not show any improvement in overall survival and only some improvement in PFS was noted. -Continue to follow with Dr. Marletta Lor for hepatitis C.  Return to clinic in 3 months after chemoembolization and MRI of abdomen to assess for response and discuss further management.  All questions and concerns were answered in detail.

## 2023-11-21 NOTE — Patient Instructions (Addendum)
 [facility]  Discharge Instructions  You were seen and examined today by Dr. Anders Simmonds. Dr. Anders Simmonds is a medical oncologist, meaning that he specializes in the treatment of cancer diagnoses. Dr. Anders Simmonds discussed your past medical history, family history of cancers, and the events that led to you being here today.  You were referred to Dr. Anders Simmonds for ongoing management of your hepatocellular carcinoma.  Dr. Anders Simmonds has recommended proceeding with the embolization as scheduled. Dr. Anders Simmonds does not recommended systemic therapy with immunotherapy due to the cancer being confined to the liver.  Follow-up as scheduled.  Thank you for choosing [facility] to provide your oncology and hematology care.   To afford each patient quality time with our provider, please arrive at least 15 minutes before your scheduled appointment time. You may need to reschedule your appointment if you arrive late (10 or more minutes). Arriving late affects you and other patients whose appointments are after yours.  Also, if you miss three or more appointments without notifying the office, you may be dismissed from the clinic at the provider's discretion.    Again, thank you for choosing [facility].  Our hope is that these requests will decrease the amount of time that you wait before being seen by our physicians.   If you have a lab appointment with the Cancer Center - please note that after April 8th, all labs will be drawn in the cancer center.  You do not have to check in or register with the main entrance as you have in the past but will complete your check-in at the cancer center.            _____________________________________________________________  Should you have questions after your visit to [facility], please contact our office at [phone] and follow the prompts.  Our office hours are 8:00 a.m. to 4:30 p.m. Monday -  Thursday and 8:00 a.m. to 2:30 p.m. Friday.  Please note that voicemails left after 4:00 p.m. may not be returned until the following business day.  We are closed weekends and all major holidays.  You do have access to a nurse 24-7, just call the main number to the clinic [phone] and do not press any options, hold on the line and a nurse will answer the phone.    For prescription refill requests, have your pharmacy contact our office and allow 72 hours.    Masks are no longer required in the cancer centers. If you would like for your care team to wear a mask while they are taking care of you, please let them know. You may have one support person who is at least 57 years old accompany you for your appointments.

## 2023-11-24 ENCOUNTER — Other Ambulatory Visit (HOSPITAL_COMMUNITY): Payer: Self-pay | Admitting: Interventional Radiology

## 2023-11-24 DIAGNOSIS — C220 Liver cell carcinoma: Secondary | ICD-10-CM

## 2023-12-17 ENCOUNTER — Other Ambulatory Visit: Payer: Self-pay | Admitting: Radiology

## 2023-12-17 DIAGNOSIS — C220 Liver cell carcinoma: Secondary | ICD-10-CM

## 2023-12-18 ENCOUNTER — Other Ambulatory Visit (HOSPITAL_COMMUNITY): Payer: Self-pay | Admitting: Interventional Radiology

## 2023-12-18 ENCOUNTER — Other Ambulatory Visit: Payer: Self-pay

## 2023-12-18 ENCOUNTER — Ambulatory Visit (HOSPITAL_COMMUNITY)
Admission: RE | Admit: 2023-12-18 | Discharge: 2023-12-18 | Disposition: A | Discharge status: Home / Self Care | Source: Ambulatory Visit | Attending: Interventional Radiology | Admitting: Interventional Radiology

## 2023-12-18 ENCOUNTER — Ambulatory Visit (HOSPITAL_COMMUNITY)
Admission: RE | Admit: 2023-12-18 | Discharge: 2023-12-18 | Disposition: A | Discharge status: Home / Self Care | Source: Ambulatory Visit | Attending: Interventional Radiology

## 2023-12-18 ENCOUNTER — Encounter (HOSPITAL_COMMUNITY): Payer: Self-pay

## 2023-12-18 DIAGNOSIS — Z8673 Personal history of transient ischemic attack (TIA), and cerebral infarction without residual deficits: Secondary | ICD-10-CM | POA: Diagnosis not present

## 2023-12-18 DIAGNOSIS — C220 Liver cell carcinoma: Secondary | ICD-10-CM

## 2023-12-18 DIAGNOSIS — K7030 Alcoholic cirrhosis of liver without ascites: Secondary | ICD-10-CM | POA: Diagnosis not present

## 2023-12-18 DIAGNOSIS — F419 Anxiety disorder, unspecified: Secondary | ICD-10-CM | POA: Diagnosis not present

## 2023-12-18 DIAGNOSIS — F17210 Nicotine dependence, cigarettes, uncomplicated: Secondary | ICD-10-CM | POA: Diagnosis not present

## 2023-12-18 DIAGNOSIS — Z79899 Other long term (current) drug therapy: Secondary | ICD-10-CM | POA: Insufficient documentation

## 2023-12-18 DIAGNOSIS — F32A Depression, unspecified: Secondary | ICD-10-CM | POA: Diagnosis not present

## 2023-12-18 DIAGNOSIS — I10 Essential (primary) hypertension: Secondary | ICD-10-CM | POA: Insufficient documentation

## 2023-12-18 HISTORY — PX: IR ANGIOGRAM SELECTIVE EACH ADDITIONAL VESSEL: IMG667

## 2023-12-18 HISTORY — PX: IR ANGIOGRAM VISCERAL SELECTIVE: IMG657

## 2023-12-18 HISTORY — PX: IR EMBO TUMOR ORGAN ISCHEMIA INFARCT INC GUIDE ROADMAPPING: IMG5449

## 2023-12-18 HISTORY — PX: IR US GUIDE VASC ACCESS RIGHT: IMG2390

## 2023-12-18 HISTORY — PX: IR 3D INDEPENDENT WKST: IMG2385

## 2023-12-18 LAB — COMPREHENSIVE METABOLIC PANEL WITH GFR
ALT: 19 U/L (ref 0–44)
AST: 25 U/L (ref 15–41)
Albumin: 3.3 g/dL — ABNORMAL LOW (ref 3.5–5.0)
Alkaline Phosphatase: 181 U/L — ABNORMAL HIGH (ref 38–126)
Anion gap: 5 (ref 5–15)
BUN: 10 mg/dL (ref 6–20)
CO2: 25 mmol/L (ref 22–32)
Calcium: 9.1 mg/dL (ref 8.9–10.3)
Chloride: 101 mmol/L (ref 98–111)
Creatinine, Ser: 0.92 mg/dL (ref 0.61–1.24)
GFR, Estimated: >60 mL/min (ref >60)
Glucose, Bld: 259 mg/dL — ABNORMAL HIGH (ref 70–99)
Potassium: 3.8 mmol/L (ref 3.5–5.1)
Sodium: 131 mmol/L — ABNORMAL LOW (ref 135–145)
Total Bilirubin: 0.9 mg/dL (ref 0.0–1.2)
Total Protein: 7 g/dL (ref 6.5–8.1)

## 2023-12-18 LAB — CBC WITH DIFFERENTIAL/PLATELET
Abs Immature Granulocytes: 0.01 10*3/uL (ref 0.00–0.07)
Basophils Absolute: 0 10*3/uL (ref 0.0–0.1)
Basophils Relative: 1 %
Eosinophils Absolute: 0.3 10*3/uL (ref 0.0–0.5)
Eosinophils Relative: 6 %
HCT: 40.4 % (ref 39.0–52.0)
Hemoglobin: 13.5 g/dL (ref 13.0–17.0)
Immature Granulocytes: 0 %
Lymphocytes Relative: 24 %
Lymphs Abs: 1.1 10*3/uL (ref 0.7–4.0)
MCH: 31 pg (ref 26.0–34.0)
MCHC: 33.4 g/dL (ref 30.0–36.0)
MCV: 92.7 fL (ref 80.0–100.0)
Monocytes Absolute: 0.4 10*3/uL (ref 0.1–1.0)
Monocytes Relative: 10 %
Neutro Abs: 2.7 10*3/uL (ref 1.7–7.7)
Neutrophils Relative %: 59 %
Platelets: 100 10*3/uL — ABNORMAL LOW (ref 150–400)
RBC: 4.36 MIL/uL (ref 4.22–5.81)
RDW: 13.4 % (ref 11.5–15.5)
WBC: 4.5 10*3/uL (ref 4.0–10.5)
nRBC: 0 % (ref 0.0–0.2)

## 2023-12-18 LAB — PROTIME-INR
INR: 1.1 (ref 0.8–1.2)
Prothrombin Time: 14.2 s (ref 11.4–15.2)

## 2023-12-18 LAB — GLUCOSE, CAPILLARY: Glucose-Capillary: 270 mg/dL — ABNORMAL HIGH (ref 70–99)

## 2023-12-18 MED ORDER — SODIUM CHLORIDE 0.9 % IV SOLN
8.0000 mg | Freq: Once | INTRAVENOUS | Status: AC
  Administered 2023-12-18: 8 mg via INTRAVENOUS
  Filled 2023-12-18: qty 4

## 2023-12-18 MED ORDER — SODIUM CHLORIDE 0.9 % IV SOLN
INTRAVENOUS | Status: AC
  Filled 2023-12-18: qty 2

## 2023-12-18 MED ORDER — LIDOCAINE HCL 1 % IJ SOLN
20.0000 mL | Freq: Once | INTRAMUSCULAR | Status: AC
  Administered 2023-12-18: 3 mL via INTRADERMAL

## 2023-12-18 MED ORDER — HYDROMORPHONE HCL 1 MG/ML IJ SOLN
INTRAMUSCULAR | Status: AC
  Filled 2023-12-18: qty 1

## 2023-12-18 MED ORDER — DEXAMETHASONE SODIUM PHOSPHATE 10 MG/ML IJ SOLN
INTRAMUSCULAR | Status: AC
  Filled 2023-12-18: qty 1

## 2023-12-18 MED ORDER — LIDOCAINE HCL (PF) 1 % IJ SOLN
INTRAMUSCULAR | Status: AC
  Filled 2023-12-18: qty 30

## 2023-12-18 MED ORDER — IOHEXOL 300 MG/ML  SOLN
50.0000 mL | Freq: Once | INTRAMUSCULAR | Status: AC | PRN
Start: 2023-12-18 — End: 2023-12-18
  Administered 2023-12-18: 5 mL

## 2023-12-18 MED ORDER — DOXORUBICIN HCL CHEMO IV INJECTION 2 MG/ML
50.0000 mg | Freq: Once | INTRAVENOUS | Status: AC
  Administered 2023-12-18: 50 mg via INTRA_ARTERIAL
  Filled 2023-12-18: qty 25

## 2023-12-18 MED ORDER — HYDROMORPHONE HCL 1 MG/ML IJ SOLN
INTRAMUSCULAR | Status: AC | PRN
  Administered 2023-12-18: 1 mg via INTRAVENOUS

## 2023-12-18 MED ORDER — SODIUM CHLORIDE 0.9 % IV SOLN: INTRAVENOUS | Status: DC

## 2023-12-18 MED ORDER — MIDAZOLAM HCL 2 MG/2ML IJ SOLN
INTRAMUSCULAR | Status: AC
  Filled 2023-12-18: qty 4

## 2023-12-18 MED ORDER — IOHEXOL 300 MG/ML  SOLN
100.0000 mL | Freq: Once | INTRAMUSCULAR | Status: AC | PRN
  Administered 2023-12-18: 50 mL via INTRA_ARTERIAL

## 2023-12-18 MED ORDER — FENTANYL CITRATE (PF) 100 MCG/2ML IJ SOLN
INTRAMUSCULAR | Status: AC
  Filled 2023-12-18: qty 2

## 2023-12-18 MED ORDER — DEXAMETHASONE SODIUM PHOSPHATE 10 MG/ML IJ SOLN
8.0000 mg | Freq: Once | INTRAMUSCULAR | Status: AC
  Administered 2023-12-18: 8 mg via INTRAVENOUS

## 2023-12-18 MED ORDER — MIDAZOLAM HCL 2 MG/2ML IJ SOLN
INTRAMUSCULAR | Status: AC | PRN
  Administered 2023-12-18 (×2): 1 mg via INTRAVENOUS

## 2023-12-18 MED ORDER — SODIUM CHLORIDE 0.9 % IV SOLN
2.0000 g | Freq: Once | INTRAVENOUS | Status: AC
  Administered 2023-12-18: 2 g via INTRAVENOUS

## 2023-12-18 MED ORDER — FENTANYL CITRATE (PF) 100 MCG/2ML IJ SOLN
INTRAMUSCULAR | Status: AC | PRN
  Administered 2023-12-18 (×2): 50 ug via INTRAVENOUS

## 2023-12-18 MED ORDER — IOHEXOL 300 MG/ML  SOLN
100.0000 mL | Freq: Once | INTRAMUSCULAR | Status: AC | PRN
Start: 2023-12-18 — End: 2023-12-18
  Administered 2023-12-18: 50 mL via INTRA_ARTERIAL

## 2023-12-18 NOTE — Discharge Instructions (Signed)
 Please call Interventional Radiology clinic [phone] with any questions or concerns.  You may remove your dressing and shower tomorrow.  After the procedure, it is common to have: Pain, nausea, vomiting, and a slight fever. You may have temperature readings between 98.2F and 100.73F (37C and 38C). These symptoms are called post-embolization syndrome. Tiredness (fatigue) Loss of appetite. This should gradually improve after about 1 week Soreness and tenderness in the area where the needle and catheter were placed (puncture site)  Follow these instructions at home:  Medication: Do not use Aspirin or ibuprofen products, such as Advil or Motrin, as it may increase bleeding.  You may resume your usual medications as ordered by your doctor If your doctor prescribed antibiotics, take them as directed. Do not stop taking them just because you feel better. You need to take the full course of antibiotics.  Eating and drinking: Drink plenty of liquids to keep your urine pale yellow You can resume your regular diet as directed by your doctor   Care of the procedure site Follow instructions from your health care provider about how to take care of the puncture site. Make sure you: Wash your hands with soap and water for at least 20 seconds before and after you change your bandage (dressing). If soap and water are not available, use hand sanitizer Change your dressing as told by your health care provider Leave stitches (sutures), skin glue, or adhesive strips in place. These skin closures may need to stay in place for 2 weeks or longer. If adhesive strip edges start to loosen and curl up, you may trim the loose edges. Do not remove adhesive strips completely unless your health care provider tells you to do that. Check your puncture site every day for signs of infection. Check for: More redness, swelling, or pain More fluid or blood Warmth Pus or a bad smell Activity Rest as told by your  health care provider You may have to avoid lifting. Ask your health care provider how much you can safely lift. Return to your normal activities as told by your health care provider. Ask your health care provider what activities are safe for you. Do not take baths, swim, or use a hot tub until your health care provider approves. You may take showers. Gently wash your puncture site with mild soap and water and pat the area dry  Keep all follow-up visits as told by your doctor  Contact a health care provider if: You have any of these signs of infection: More redness, swelling, or pain around your puncture site More fluid or blood coming from your puncture site Warmth coming from your puncture site Pus or a bad smell coming from your puncture site You have a fever that gets worse or is higher than what your health care provider told you to expect You have pain that gets worse or does not get better with medicine You have nausea or vomiting that lasts for more than 2 days You cannot drink fluids without vomiting You develop a rash  Get help right away if: You develop any of the following in your legs: Pain Swelling Skin that is cold or pale or turns blue You develop shortness of breath You faint You have chest pain You feel weak or have trouble moving your arms or legs You have problems with speech or vision  Moderate Conscious Sedation-Care After  This sheet gives you information about how to care for yourself after your procedure. Your health care provider may  also give you more specific instructions. If you have problems or questions, contact your health care provider.  After the procedure, it is common to have: Sleepiness for several hours. Impaired judgment for several hours. Difficulty with balance. Vomiting if you eat too soon.  Follow these instructions at home:  Rest. Do not participate in activities where you could fall or become injured. Do not drive or use  machinery. Do not drink alcohol. Do not take sleeping pills or medicines that cause drowsiness. Do not make important decisions or sign legal documents. Do not take care of children on your own.  Eating and drinking Follow the diet recommended by your health care provider. Drink enough fluid to keep your urine pale yellow. If you vomit: Drink water, juice, or soup when you can drink without vomiting. Make sure you have little or no nausea before eating solid foods.  General instructions Take over-the-counter and prescription medicines only as told by your health care provider. Have a responsible adult stay with you for the time you are told. It is important to have someone help care for you until you are awake and alert. Do not smoke. Keep all follow-up visits as told by your health care provider. This is important.  Contact a health care provider if: You are still sleepy or having trouble with balance after 24 hours. You feel light-headed. You keep feeling nauseous or you keep vomiting. You develop a rash. You have a fever. You have redness or swelling around the IV site.  Get help right away if: You have trouble breathing. You have new-onset confusion at home.  This information is not intended to replace advice given to you by your health care provider. Make sure you discuss any questions you have with your healthcare provider.

## 2023-12-18 NOTE — Procedures (Signed)
 Interventional Radiology Procedure Note  Procedure:  Transarterial chemoembolization of right lobe and segment IV.  Findings: Please refer to procedural dictation for full description. Right CFA access, 5 Fr Mynx closure.  Complications: none immediate  Estimated Blood Loss: < 5 mL  Recommendations: IR will arrange 1 month follow up.   Marliss Coots, MD

## 2023-12-18 NOTE — H&P (Signed)
 Chief Complaint: Patient was seen in consultation today for hepatocellular carcinoma   at the request of Suttle,Dylan J  Referring Physician(s): Bennie Dallas  Supervising Physician: Marliss Coots  Patient Status: [facility] - Out-pt  Full Code  History of Present Illness: Danny [name] is a 58 y.o. male with history of ETOH abuse, anxiety, depression, CVA, hepatitis C, HTN, cirrhosis, and HCC. Patient is known to IR -s/p left lobar hepatic transarterial radioembolization 2/23 and segment VIII radiation segmentectomy 7/23. Repeat MRI 11/12/23 abdomen impression listed below.    Patient's last visit with Dr. Elby Showers was 11/17/23. At this visit, further treatment options were discussed. Patient ultimately elected to proceed with chemoembolization. Patient is currently established with Cindie Crumbly, MD with hematology. Denies: chest pain, shortness of breath, nausea, vomiting, abdominal pain, diarrhea, and/or fever.  Past Medical History:  Diagnosis Date   Alcohol abuse    Anxiety    Back pain    Cancer (HCC)    Cirrhosis (HCC)    CVA (cerebral infarction)    Depression    Diabetes mellitus    Hepatitis C    Hypertension    Migraines    Stroke [facility])     Past Surgical History:  Procedure Laterality Date   ABDOMINAL AORTOGRAM W/LOWER EXTREMITY Right 05/06/2022   Procedure: ABDOMINAL AORTOGRAM W/LOWER EXTREMITY;  Surgeon: Maeola Harman, MD;  Location: [facility] INVASIVE CV LAB;  Service: Cardiovascular;  Laterality: Right;   CAST APPLICATION Right 11/02/2021   Procedure: CAST APPLICATION;  Surgeon: Vickki Hearing, MD;  Location: [facility] ORS;  Service: Orthopedics;  Laterality: Right;   ESOPHAGOGASTRODUODENOSCOPY (EGD) WITH PROPOFOL N/A 09/16/2022   Procedure: ESOPHAGOGASTRODUODENOSCOPY (EGD) WITH PROPOFOL;  Surgeon: Lanelle Bal, DO;  Location: [facility] ENDO SUITE;  Service: Endoscopy;  Laterality: N/A;  12:30pm, asa 3, pt knows to arrive at 6:45   FOOT SURGERY     INCISION  AND DRAINAGE Right 11/02/2021   Procedure: INCISION AND DRAINAGE RIGHT KNEE;  Surgeon: Vickki Hearing, MD;  Location: [facility] ORS;  Service: Orthopedics;  Laterality: Right;   IR 3D INDEPENDENT WKST  10/11/2021   IR 3D INDEPENDENT WKST  02/28/2022   IR 3D INDEPENDENT WKST  03/14/2022   IR ANGIOGRAM SELECTIVE EACH ADDITIONAL VESSEL  10/11/2021   IR ANGIOGRAM SELECTIVE EACH ADDITIONAL VESSEL  10/11/2021   IR ANGIOGRAM SELECTIVE EACH ADDITIONAL VESSEL  10/11/2021   IR ANGIOGRAM SELECTIVE EACH ADDITIONAL VESSEL  10/11/2021   IR ANGIOGRAM SELECTIVE EACH ADDITIONAL VESSEL  10/11/2021   IR ANGIOGRAM SELECTIVE EACH ADDITIONAL VESSEL  10/11/2021   IR ANGIOGRAM SELECTIVE EACH ADDITIONAL VESSEL  10/25/2021   IR ANGIOGRAM SELECTIVE EACH ADDITIONAL VESSEL  10/25/2021   IR ANGIOGRAM SELECTIVE EACH ADDITIONAL VESSEL  02/28/2022   IR ANGIOGRAM SELECTIVE EACH ADDITIONAL VESSEL  02/28/2022   IR ANGIOGRAM SELECTIVE EACH ADDITIONAL VESSEL  02/28/2022   IR ANGIOGRAM SELECTIVE EACH ADDITIONAL VESSEL  03/14/2022   IR ANGIOGRAM VISCERAL SELECTIVE  10/11/2021   IR ANGIOGRAM VISCERAL SELECTIVE  02/28/2022   IR ANGIOGRAM VISCERAL SELECTIVE  03/14/2022   IR EMBO TUMOR ORGAN ISCHEMIA INFARCT INC GUIDE ROADMAPPING  10/25/2021   IR EMBO TUMOR ORGAN ISCHEMIA INFARCT INC GUIDE ROADMAPPING  10/25/2021   IR EMBO TUMOR ORGAN ISCHEMIA INFARCT INC GUIDE ROADMAPPING  03/14/2022   IR RADIOLOGIST EVAL & MGMT  08/23/2021   IR RADIOLOGIST EVAL & MGMT  11/21/2021   IR RADIOLOGIST EVAL & MGMT  01/24/2022   IR RADIOLOGIST EVAL & MGMT  04/09/2022   IR  RADIOLOGIST EVAL & MGMT  08/05/2022   IR RADIOLOGIST EVAL & MGMT  11/29/2022   IR RADIOLOGIST EVAL & MGMT  06/11/2023   IR RADIOLOGIST EVAL & MGMT  11/17/2023   IR US GUIDE VASC ACCESS RIGHT  10/11/2021   IR US GUIDE VASC ACCESS RIGHT  10/25/2021   IR US GUIDE VASC ACCESS RIGHT  02/28/2022   IR US GUIDE VASC ACCESS RIGHT  03/14/2022   MANDIBLE SURGERY     PERIPHERAL VASCULAR INTERVENTION Right 05/06/2022   Procedure:  PERIPHERAL VASCULAR INTERVENTION;  Surgeon: Maeola Harman, MD;  Location: [facility] INVASIVE CV LAB;  Service: Cardiovascular;  Laterality: Right;  SFA   QUADRICEPS TENDON REPAIR Right 09/04/2021   Procedure: REPAIR QUADRICEP TENDON;  Surgeon: Vickki Hearing, MD;  Location: [facility] ORS;  Service: Orthopedics;  Laterality: Right;   TENDON EXPLORATION Right 11/02/2021   Procedure: QUADRICEPS TENDON AND PATELLAR TENDON EXPLORATION;  Surgeon: Vickki Hearing, MD;  Location: [facility] ORS;  Service: Orthopedics;  Laterality: Right;    Allergies: Ultram [tramadol], Benadryl [diphenhydramine], and Trazodone  Medications: Prior to Admission medications   Medication Sig Start Date End Date Taking? Authorizing Provider  albuterol (VENTOLIN HFA) 108 (90 Base) MCG/ACT inhaler Inhale 2 puffs into the lungs every 6 (six) hours as needed for wheezing or shortness of breath.    [provider]  ALPRAZolam Prudy Feeler) 0.25 MG tablet Take 0.25 mg by mouth at bedtime.    [provider]  Cholecalciferol (VITAMIN D-3) 125 MCG (5000 UT) TABS Take 5,000 Units by mouth daily.    [provider]  citalopram (CELEXA) 20 MG tablet Take 20 mg by mouth daily.    [provider]  cyclobenzaprine (FLEXERIL) 5 MG tablet Take 5 mg by mouth daily as needed for muscle spasms. 03/06/23   [provider]  gabapentin (NEURONTIN) 600 MG tablet Take 1 tablet (600 mg total) by mouth in the morning and at bedtime. 10/29/22   Vickki Hearing, MD  Melatonin 10 MG CAPS Take 10 mg by mouth at bedtime.    [provider]  metFORMIN (GLUCOPHAGE) 500 MG tablet Take 1 tablet (500 mg total) by mouth 2 (two) times daily with a meal. 01/01/18   Loren Racer, MD  mirtazapine (REMERON) 7.5 MG tablet Take 1 tablet every day by oral route for 90 days. 11/17/23   [provider]  naloxone [facility]) nasal spray 4 mg/0.1 mL Place 1 spray into the nose once. 01/01/22   [provider]  oxyCODONE-acetaminophen (PERCOCET) 10-325 MG tablet Take 1 tablet by mouth every 4 (four) hours as needed for pain. 04/03/23   Vickki Hearing, MD  pantoprazole (PROTONIX) 40 MG tablet Take 1 tablet (40 mg total) by mouth daily. 05/07/22 05/07/23  Emilie Rutter, PA-C  QUEtiapine (SEROQUEL) 100 MG tablet Take 100 mg by mouth at bedtime.    [provider]  rosuvastatin (CRESTOR) 10 MG tablet Take 1 tablet (10 mg total) by mouth daily. 05/06/22 05/06/23  Maeola Harman, MD  Sofosbuvir-Velpatasvir 400-100 MG TABS Take 1 tablet by mouth daily.    [provider]  Vitamin D, Ergocalciferol, (DRISDOL) 1.25 MG (50000 UNIT) CAPS capsule Take 1 capsule by mouth once a week. 06/29/23   [provider]     Family History  Problem Relation Age of Onset   Heart failure Mother    Stroke Father    Stroke Sister    Cancer Sister  breast cancer   Colon cancer Neg Hx    Liver cancer Neg Hx     Social History   Socioeconomic History   Marital status: Divorced    Spouse name: Not on file   Number of children: Not on file   Years of education: Not on file   Highest education level: Not on file  Occupational History   Not on file  Tobacco Use   Smoking status: Every Day    Current packs/day: 0.50    Average packs/day: 0.5 packs/day for 20.0 years (10.0 ttl pk-yrs)    Types: Cigarettes    Passive exposure: Current   Smokeless tobacco: Never  Vaping Use   Vaping status: Never Used  Substance and Sexual Activity   Alcohol use: Not Currently    Comment: Quit 11 months ago. Used to drink about 6 pack a day. (documented 06/06/21)   Drug use: Yes    Types: Marijuana    Comment: occas   Sexual activity: Yes    Birth control/protection: None  Other Topics Concern   Not on file  Social History Narrative   Not on file   Social Drivers of Health   Financial Resource Strain: Not on file  Food Insecurity: No Food Insecurity (11/21/2023)   Hunger  Vital Sign    Worried About Running Out of Food in the Last Year: Never true    Ran Out of Food in the Last Year: Never true  Transportation Needs: No Transportation Needs (11/21/2023)   PRAPARE - Administrator, Civil Service (Medical): No    Lack of Transportation (Non-Medical): No  Physical Activity: Not on file  Stress: Not on file  Social Connections: Not on file      Review of Systems: A 12 point ROS discussed and pertinent positives are indicated in the HPI above.  All other systems are negative.  Review of Systems  Constitutional:  Negative for fever.  Respiratory:  Negative for shortness of breath.   Cardiovascular:  Negative for chest pain.  Gastrointestinal:  Negative for abdominal pain, diarrhea, nausea and vomiting.  Psychiatric/Behavioral:  Negative for confusion.     Vital Signs: There were no vitals taken for this visit.    Physical Exam HENT:     Head: Normocephalic and atraumatic.  Cardiovascular:     Rate and Rhythm: Normal rate.  Pulmonary:     Effort: Pulmonary effort is normal.     Breath sounds: Normal breath sounds.  Skin:    General: Skin is warm.  Neurological:     General: No focal deficit present.     Mental Status: He is oriented to person, place, and time.  Psychiatric:        Thought Content: Thought content normal.     Imaging: No results found.  Labs:  CBC: Recent Labs    11/10/23 2109  WBC 5.8  HGB 15.8  HCT 46.1  PLT 129*    COAGS: No results for input(s): "INR", "APTT" in the last 8760 hours.  BMP: Recent Labs    11/10/23 2109  NA 136  K 3.7  CL 102  CO2 22  GLUCOSE 257*  BUN 9  CALCIUM 9.4  CREATININE 0.84  GFRNONAA >60    LIVER FUNCTION TESTS: Recent Labs    11/10/23 2109  BILITOT 1.0  AST 28  ALT 21  ALKPHOS 142*  PROT 8.1  ALBUMIN 3.6    TUMOR MARKERS: Recent Labs    05/23/23 1032  AFPTM 7.0*    Assessment and Plan: Hepatocellular carcinoma   Patient is a 58 y/o male  known to IR with history of ETOH abuse, anxiety, depression, CVA, hepatitis C, HTN, cirrhosis, and HCC. Repeat MRI was performed 11/17/23. Patient presents today for chemoembolization.   Risks and benefits of transcatheter arterial chemoembolization procedure were discussed with the patient including, but not limited to bleeding, infection, vascular injury or contrast induced renal failure.  This interventional procedure involves the use of X-rays and because of the nature of the planned procedure, it is possible that we will have prolonged use of X-ray fluoroscopy.  Potential radiation risks to you include (but are not limited to) the following: - A slightly elevated risk for cancer  several years later in life. This risk is typically less than 0.5% percent. This risk is low in comparison to the normal incidence of human cancer, which is 33% for women and 50% for men according to the American Cancer Society. - Radiation induced injury can include skin redness, resembling a rash, tissue breakdown / ulcers and hair loss (which can be temporary or permanent).   The likelihood of either of these occurring depends on the difficulty of the procedure and whether you are sensitive to radiation due to previous procedures, disease, or genetic conditions.   IF your procedure requires a prolonged use of radiation, you will be notified and given written instructions for further action.  It is your responsibility to monitor the irradiated area for the 2 weeks following the procedure and to notify your physician if you are concerned that you have suffered a radiation induced injury.    All of the patient's questions were answered, patient is agreeable to proceed.  Consent signed and in chart.    Thank you for this interesting consult.  I greatly enjoyed meeting Danny [name] and look forward to participating in their care.  A copy of this report was sent to the requesting provider on this  date.  Electronically Signed: Rosalita Levan, PA 12/18/2023, 11:12 AM   I spent a total of    25 Minutes in face to face in clinical consultation, greater than 50% of which was counseling/coordinating care for hepatocellular carcinoma.

## 2023-12-19 ENCOUNTER — Other Ambulatory Visit: Payer: Self-pay | Admitting: Interventional Radiology

## 2023-12-19 ENCOUNTER — Other Ambulatory Visit (HOSPITAL_COMMUNITY): Payer: Self-pay | Admitting: Radiology

## 2023-12-19 ENCOUNTER — Other Ambulatory Visit (HOSPITAL_COMMUNITY): Payer: Self-pay | Admitting: Interventional Radiology

## 2023-12-19 DIAGNOSIS — C220 Liver cell carcinoma: Secondary | ICD-10-CM

## 2023-12-19 LAB — GLUCOSE, CAPILLARY: Glucose-Capillary: 213 mg/dL — ABNORMAL HIGH (ref 70–99)

## 2023-12-29 ENCOUNTER — Other Ambulatory Visit: Payer: Self-pay | Admitting: Interventional Radiology

## 2023-12-29 MED ORDER — DOXORUBICIN HCL 50 MG IV SOLR: 50.0000 mg | INTRAVENOUS | Status: AC

## 2024-01-29 ENCOUNTER — Ambulatory Visit (HOSPITAL_COMMUNITY)
Admission: RE | Admit: 2024-01-29 | Discharge: 2024-01-29 | Disposition: A | Discharge status: Home / Self Care | Source: Ambulatory Visit | Attending: Interventional Radiology | Admitting: Interventional Radiology

## 2024-01-29 DIAGNOSIS — C220 Liver cell carcinoma: Secondary | ICD-10-CM | POA: Diagnosis present

## 2024-01-29 MED ORDER — IOHEXOL 300 MG/ML  SOLN
100.0000 mL | Freq: Once | INTRAMUSCULAR | Status: AC | PRN
  Administered 2024-01-29: 100 mL via INTRAVENOUS

## 2024-02-06 ENCOUNTER — Ambulatory Visit
Admission: RE | Admit: 2024-02-06 | Discharge: 2024-02-06 | Disposition: A | Discharge status: Home / Self Care | Source: Ambulatory Visit | Attending: Interventional Radiology | Admitting: Interventional Radiology

## 2024-02-06 DIAGNOSIS — C220 Liver cell carcinoma: Secondary | ICD-10-CM

## 2024-02-06 HISTORY — PX: IR RADIOLOGIST EVAL & MGMT: IMG5224

## 2024-02-06 NOTE — Progress Notes (Signed)
 This encounter was conducted via the [employer] providing interactive audio and visual communication.  The patient provided verbal consent to conduct a virtual appointment.  The patient was located at their primary residence during this encounter.   Referring Physician(s): Duey Ghent, NP, Goble Last, DO   Chief Complaint: The patient is seen in virtual video follow up today s/p   History of present illness:  HPI from last clinic visit 11/17/23 Danny [name] is a 58 y.o. male with history of alcoholic cirrhosis and multifocal hepatocellular carcinoma.  He had complained of right upper quadrant pain for approximately 1 year. Multiple imaging studies eventually led to a CT which demonstrated enhancing lesions in the left lobe of the liver. This prompted an MRI which demonstrated a 2.7 cm LIRADS 5 mass in segment II and LIRADS 3 lesion in the right dome. There was a 1.6 cm mass on prior CTA in segment III which was inconspicuous on MR, but highly suspicious for additional HCC. For this, he is status post left hepatic TARE on 10/25/21.  Approximately 50% of the dose was delivered in segmentectomy fashion to the index segment II hepatoma followed by delivery of the remaining in a lobar distribution.  Initial 3 month post-TARE MRI abdomen showed enlargement of right sided masses and good response of the left sided, treated masses. Therefore, he underwent additional mapping and then segment VIII radiation segmentectomy on 03/14/22.   He continues to overall do well. He endorses a good appetite. No jaundice or scleral icterus, nausea, vomiting.   Swollen legs for the past 3 days. He had surveillance MRI performed on 05/23/23.   Surveillance MRI 05/23/23 demonstrated a slightly enlarged LIRADS 5 segment IV mass. His a-FP had been stable and at our last visit we discussed next steps including observation or locoregional therapy, specifically transarterial chemoembolization. He elected to repeat  an MRI in 3 months to assess any change before pursuing an additional procedure.  He presented for follow up 11/17/23 and reported feeling well. His recent imaging demonstrated a slight global increase in multifocal bilobar hepatocellular carcinoma with a single LIRADS 5 lesion in segment IV and mutlifocal LIRADS 3-4 lesions in the right lobe. He had a concordant slight increase in a-FP. We discussed treatment options including continuing to monitor and pursuing liver directed therapy such as transarterial chemoembolization. This was completed on 12/18/23.  He has recovered well.  He endorses some fatigue.  No abdominal pain.  Past Medical History:  Diagnosis Date   Alcohol abuse    Anxiety    Back pain    Cancer (HCC)    Cirrhosis (HCC)    CVA (cerebral infarction)    Depression    Diabetes mellitus    Hepatitis C    Hypertension    Migraines    Stroke [facility])     Past Surgical History:  Procedure Laterality Date   ABDOMINAL AORTOGRAM W/LOWER EXTREMITY Right 05/06/2022   Procedure: ABDOMINAL AORTOGRAM W/LOWER EXTREMITY;  Surgeon: Adine Hoof, MD;  Location: [facility] INVASIVE CV LAB;  Service: Cardiovascular;  Laterality: Right;   CAST APPLICATION Right 11/02/2021   Procedure: CAST APPLICATION;  Surgeon: Darrin Emerald, MD;  Location: [facility] ORS;  Service: Orthopedics;  Laterality: Right;   ESOPHAGOGASTRODUODENOSCOPY (EGD) WITH PROPOFOL  N/A 09/16/2022   Procedure: ESOPHAGOGASTRODUODENOSCOPY (EGD) WITH PROPOFOL ;  Surgeon: Vinetta Greening, DO;  Location: [facility] ENDO SUITE;  Service: Endoscopy;  Laterality: N/A;  12:30pm, asa 3, pt knows to arrive at 6:45  FOOT SURGERY     INCISION AND DRAINAGE Right 11/02/2021   Procedure: INCISION AND DRAINAGE RIGHT KNEE;  Surgeon: Darrin Emerald, MD;  Location: [facility] ORS;  Service: Orthopedics;  Laterality: Right;   IR 3D INDEPENDENT WKST  10/11/2021   IR 3D INDEPENDENT WKST  02/28/2022   IR 3D INDEPENDENT WKST  03/14/2022   IR 3D INDEPENDENT WKST   12/18/2023   IR 3D INDEPENDENT WKST  12/18/2023   IR 3D INDEPENDENT WKST  12/18/2023   IR ANGIOGRAM SELECTIVE EACH ADDITIONAL VESSEL  10/11/2021   IR ANGIOGRAM SELECTIVE EACH ADDITIONAL VESSEL  10/11/2021   IR ANGIOGRAM SELECTIVE EACH ADDITIONAL VESSEL  10/11/2021   IR ANGIOGRAM SELECTIVE EACH ADDITIONAL VESSEL  10/11/2021   IR ANGIOGRAM SELECTIVE EACH ADDITIONAL VESSEL  10/11/2021   IR ANGIOGRAM SELECTIVE EACH ADDITIONAL VESSEL  10/11/2021   IR ANGIOGRAM SELECTIVE EACH ADDITIONAL VESSEL  10/25/2021   IR ANGIOGRAM SELECTIVE EACH ADDITIONAL VESSEL  10/25/2021   IR ANGIOGRAM SELECTIVE EACH ADDITIONAL VESSEL  02/28/2022   IR ANGIOGRAM SELECTIVE EACH ADDITIONAL VESSEL  02/28/2022   IR ANGIOGRAM SELECTIVE EACH ADDITIONAL VESSEL  02/28/2022   IR ANGIOGRAM SELECTIVE EACH ADDITIONAL VESSEL  03/14/2022   IR ANGIOGRAM SELECTIVE EACH ADDITIONAL VESSEL  12/18/2023   IR ANGIOGRAM SELECTIVE EACH ADDITIONAL VESSEL  12/18/2023   IR ANGIOGRAM VISCERAL SELECTIVE  10/11/2021   IR ANGIOGRAM VISCERAL SELECTIVE  02/28/2022   IR ANGIOGRAM VISCERAL SELECTIVE  03/14/2022   IR ANGIOGRAM VISCERAL SELECTIVE  12/18/2023   IR EMBO TUMOR ORGAN ISCHEMIA INFARCT INC GUIDE ROADMAPPING  10/25/2021   IR EMBO TUMOR ORGAN ISCHEMIA INFARCT INC GUIDE ROADMAPPING  10/25/2021   IR EMBO TUMOR ORGAN ISCHEMIA INFARCT INC GUIDE ROADMAPPING  03/14/2022   IR EMBO TUMOR ORGAN ISCHEMIA INFARCT INC GUIDE ROADMAPPING  12/18/2023   IR RADIOLOGIST EVAL & MGMT  08/23/2021   IR RADIOLOGIST EVAL & MGMT  11/21/2021   IR RADIOLOGIST EVAL & MGMT  01/24/2022   IR RADIOLOGIST EVAL & MGMT  04/09/2022   IR RADIOLOGIST EVAL & MGMT  08/05/2022   IR RADIOLOGIST EVAL & MGMT  11/29/2022   IR RADIOLOGIST EVAL & MGMT  06/11/2023   IR RADIOLOGIST EVAL & MGMT  11/17/2023   IR US  GUIDE VASC ACCESS RIGHT  10/11/2021   IR US  GUIDE VASC ACCESS RIGHT  10/25/2021   IR US  GUIDE VASC ACCESS RIGHT  02/28/2022   IR US  GUIDE VASC ACCESS RIGHT  03/14/2022   IR US  GUIDE VASC ACCESS RIGHT  12/18/2023   MANDIBLE  SURGERY     PERIPHERAL VASCULAR INTERVENTION Right 05/06/2022   Procedure: PERIPHERAL VASCULAR INTERVENTION;  Surgeon: Adine Hoof, MD;  Location: [facility] INVASIVE CV LAB;  Service: Cardiovascular;  Laterality: Right;  SFA   QUADRICEPS TENDON REPAIR Right 09/04/2021   Procedure: REPAIR QUADRICEP TENDON;  Surgeon: Darrin Emerald, MD;  Location: [facility] ORS;  Service: Orthopedics;  Laterality: Right;   TENDON EXPLORATION Right 11/02/2021   Procedure: QUADRICEPS TENDON AND PATELLAR TENDON EXPLORATION;  Surgeon: Darrin Emerald, MD;  Location: [facility] ORS;  Service: Orthopedics;  Laterality: Right;    Allergies: Ultram  [tramadol ], Benadryl  [diphenhydramine ], and Trazodone  Medications: Prior to Admission medications   Medication Sig Start Date End Date Taking? Authorizing Provider  albuterol  (VENTOLIN  HFA) 108 (90 Base) MCG/ACT inhaler Inhale 2 puffs into the lungs every 6 (six) hours as needed for wheezing or shortness of breath.    [provider]  ALPRAZolam (XANAX) 0.25 MG tablet Take 0.25 mg by  mouth at bedtime.    [provider]  Cholecalciferol  (VITAMIN D -3) 125 MCG (5000 UT) TABS Take 5,000 Units by mouth daily.    [provider]  citalopram  (CELEXA ) 20 MG tablet Take 20 mg by mouth daily.    [provider]  cyclobenzaprine  (FLEXERIL ) 5 MG tablet Take 5 mg by mouth daily as needed for muscle spasms. 03/06/23   [provider]  gabapentin  (NEURONTIN ) 600 MG tablet Take 1 tablet (600 mg total) by mouth in the morning and at bedtime. 10/29/22   Darrin Emerald, MD  Melatonin 10 MG CAPS Take 10 mg by mouth at bedtime.    [provider]  metFORMIN  (GLUCOPHAGE ) 500 MG tablet Take 1 tablet (500 mg total) by mouth 2 (two) times daily with a meal. 01/01/18   Evone Hoh, MD  mirtazapine (REMERON) 7.5 MG tablet Take 1 tablet every day by oral route for 90 days. 11/17/23   [provider]  naloxone [facility]) nasal spray 4  mg/0.1 mL Place 1 spray into the nose once. 01/01/22   [provider]  oxyCODONE -acetaminophen  (PERCOCET) 10-325 MG tablet Take 1 tablet by mouth every 4 (four) hours as needed for pain. 04/03/23   Darrin Emerald, MD  pantoprazole  (PROTONIX ) 40 MG tablet Take 1 tablet (40 mg total) by mouth daily. 05/07/22 05/07/23  Cordie Deters, PA-C  QUEtiapine  (SEROQUEL ) 100 MG tablet Take 100 mg by mouth at bedtime.    [provider]  rosuvastatin  (CRESTOR ) 10 MG tablet Take 1 tablet (10 mg total) by mouth daily. 05/06/22 05/06/23  Adine Hoof, MD  Sofosbuvir-Velpatasvir 400-100 MG TABS Take 1 tablet by mouth daily.    [provider]  Vitamin D , Ergocalciferol , (DRISDOL) 1.25 MG (50000 UNIT) CAPS capsule Take 1 capsule by mouth once a week. 06/29/23   [provider]     Family History  Problem Relation Age of Onset   Heart failure Mother    Stroke Father    Stroke Sister    Cancer Sister        breast cancer   Colon cancer Neg Hx    Liver cancer Neg Hx     Social History   Socioeconomic History   Marital status: Divorced    Spouse name: Not on file   Number of children: Not on file   Years of education: Not on file   Highest education level: Not on file  Occupational History   Not on file  Tobacco Use   Smoking status: Every Day    Current packs/day: 0.50    Average packs/day: 0.5 packs/day for 20.0 years (10.0 ttl pk-yrs)    Types: Cigarettes    Passive exposure: Current   Smokeless tobacco: Never  Vaping Use   Vaping status: Never Used  Substance and Sexual Activity   Alcohol use: Not Currently    Comment: Quit 11 months ago. Used to drink about 6 pack a day. (documented 06/06/21)   Drug use: Yes    Types: Marijuana    Comment: occas   Sexual activity: Yes    Birth control/protection: None  Other Topics Concern   Not on file  Social History Narrative   Not on file   Social Drivers of Health   Financial Resource Strain:  Not on file  Food Insecurity: No Food Insecurity (11/21/2023)   Hunger Vital Sign    Worried About Running Out of Food in the Last Year: Never true    Ran Out  of Food in the Last Year: Never true  Transportation Needs: No Transportation Needs (11/21/2023)   PRAPARE - Administrator, Civil Service (Medical): No    Lack of Transportation (Non-Medical): No  Physical Activity: Not on file  Stress: Not on file  Social Connections: Not on file     Vital Signs: There were no vitals taken for this visit.  Physical Exam  Imaging:   Mapping study 02/28/22     Post Seg VIII TARE (03/14/22)    MR Abdomen 07/18/22  Post-TARE changes in left lobe and segment VIII, no focal enhancing lesions - LIRADS TR-non viable.   MR Abdomen 11/27/22  Post-TARE changes in left lobe and segment VIII, no focal enhancing lesions - LIRADS TR-non viable.   MR abdomen 05/23/23  Post-TARE changes in left lobe and segment VIII.  New segment IV 1.7 cm enhancing mass with washout, previously 1.5 cm = LIRADS 5   MRI abdomen 11/12/23 5 LIRADS 3/4 lesions +/- 1 cm, all less than 2 cm LIRADS 5 lesion in segment IV, 1.7 cm Post Y90 changes in segment VIII.   CT abdomen 01/29/24        Labs:  CBC: Recent Labs    11/10/23 2109 12/18/23 1207  WBC 5.8 4.5  HGB 15.8 13.5  HCT 46.1 40.4  PLT 129* 100*    COAGS: Recent Labs    12/18/23 1207  INR 1.1    BMP: Recent Labs    11/10/23 2109 12/18/23 1207  NA 136 131*  K 3.7 3.8  CL 102 101  CO2 22 25  GLUCOSE 257* 259*  BUN 9 10  CALCIUM  9.4 9.1  CREATININE 0.84 0.92  GFRNONAA >60 >60    LIVER FUNCTION TESTS: Recent Labs    11/10/23 2109 12/18/23 1207  BILITOT 1.0 0.9  AST 28 25  ALT 21 19  ALKPHOS 142* 181*  PROT 8.1 7.0  ALBUMIN  3.6 3.3*    Assessment and Plan: 58 year old male with history of hepatitis C/alcoholic cirrhosis (Child Pugh A, ALBI 2) and multifocal, bilobar hepatocellular carcinoma now status post left  lobar hepatic transarterial radioembolization on 10/25/21 and segment VIII radiation segmentectomy on 03/14/22.  His hepatitis C is now treated.  Imaging demonstrated slight global increase in multifocal bilobar hepatocellular carcinoma with a single LIRADS 5 lesion in segment IV and mutlifocal LIRADS 3-4 lesions in the right lobe with concordant slight increase in a-FP.  He is now status post conventional transarterial chemoembolization of the right lobe on 12/18/23.  Post-procedural imaging demonstrates stability of disease, without resolution.  -follow up with Dr. Orvis Blare for consideration of immunotherapy -obtain outpatient CBC, CMP, INR, and alpha-fetoprotein -follow up in IR in 3 months with MRI abdomen   Creasie Doctor, MD Pager: [phone]    I spent a total of 25 Minutes in virtual video clinical consultation, greater than 50% of which was counseling/coordinating care for hepatocellular carcinoma.

## 2024-02-19 ENCOUNTER — Inpatient Hospital Stay: Admitting: Oncology

## 2024-02-23 ENCOUNTER — Inpatient Hospital Stay: Admitting: Oncology

## 2024-02-25 ENCOUNTER — Inpatient Hospital Stay: Attending: Oncology | Admitting: Oncology

## 2024-02-25 ENCOUNTER — Inpatient Hospital Stay

## 2024-02-25 VITALS — BP 126/68 | HR 98 | Temp 97.7°F | Resp 19 | Wt 164.9 lb

## 2024-02-25 DIAGNOSIS — B1920 Unspecified viral hepatitis C without hepatic coma: Secondary | ICD-10-CM | POA: Diagnosis not present

## 2024-02-25 DIAGNOSIS — C220 Liver cell carcinoma: Secondary | ICD-10-CM | POA: Insufficient documentation

## 2024-02-25 DIAGNOSIS — F17200 Nicotine dependence, unspecified, uncomplicated: Secondary | ICD-10-CM | POA: Insufficient documentation

## 2024-02-25 DIAGNOSIS — Z720 Tobacco use: Secondary | ICD-10-CM | POA: Diagnosis not present

## 2024-02-25 DIAGNOSIS — K7460 Unspecified cirrhosis of liver: Secondary | ICD-10-CM | POA: Insufficient documentation

## 2024-02-25 LAB — COMPREHENSIVE METABOLIC PANEL WITH GFR
ALT: 21 U/L (ref 0–44)
AST: 30 U/L (ref 15–41)
Albumin: 3.6 g/dL (ref 3.5–5.0)
Alkaline Phosphatase: 138 U/L — ABNORMAL HIGH (ref 38–126)
Anion gap: 10 (ref 5–15)
BUN: 9 mg/dL (ref 6–20)
CO2: 22 mmol/L (ref 22–32)
Calcium: 9.7 mg/dL (ref 8.9–10.3)
Chloride: 104 mmol/L (ref 98–111)
Creatinine, Ser: 0.91 mg/dL (ref 0.61–1.24)
GFR, Estimated: >60 mL/min (ref >60)
Glucose, Bld: 202 mg/dL — ABNORMAL HIGH (ref 70–99)
Potassium: 3.8 mmol/L (ref 3.5–5.1)
Sodium: 136 mmol/L (ref 135–145)
Total Bilirubin: 0.9 mg/dL (ref 0.0–1.2)
Total Protein: 7.7 g/dL (ref 6.5–8.1)

## 2024-02-25 LAB — CBC WITH DIFFERENTIAL/PLATELET
Abs Immature Granulocytes: 0.02 10*3/uL (ref 0.00–0.07)
Basophils Absolute: 0.1 10*3/uL (ref 0.0–0.1)
Basophils Relative: 1 %
Eosinophils Absolute: 0.2 10*3/uL (ref 0.0–0.5)
Eosinophils Relative: 4 %
HCT: 45 % (ref 39.0–52.0)
Hemoglobin: 15.8 g/dL (ref 13.0–17.0)
Immature Granulocytes: 0 %
Lymphocytes Relative: 22 %
Lymphs Abs: 1.2 10*3/uL (ref 0.7–4.0)
MCH: 32 pg (ref 26.0–34.0)
MCHC: 35.1 g/dL (ref 30.0–36.0)
MCV: 91.1 fL (ref 80.0–100.0)
Monocytes Absolute: 0.4 10*3/uL (ref 0.1–1.0)
Monocytes Relative: 7 %
Neutro Abs: 3.5 10*3/uL (ref 1.7–7.7)
Neutrophils Relative %: 66 %
Platelets: 112 10*3/uL — ABNORMAL LOW (ref 150–400)
RBC: 4.94 MIL/uL (ref 4.22–5.81)
RDW: 13.2 % (ref 11.5–15.5)
WBC: 5.4 10*3/uL (ref 4.0–10.5)
nRBC: 0 % (ref 0.0–0.2)

## 2024-02-25 LAB — PROTIME-INR
INR: 1.1 (ref 0.8–1.2)
Prothrombin Time: 14.8 s (ref 11.4–15.2)

## 2024-02-25 NOTE — Assessment & Plan Note (Addendum)
 Patient has a history of hepatocellular carcinoma initially diagnosed in 10/22.  Patient is not a candidate for liver transplant secondary to cirrhosis and hepatitis C. S/p TARE and Y90 in 03/14/2022 and 08/24/2022 respectively.   MRI from 11/27/2023 showed progression of disease Patient had chemo embolization on 12/18/2023 and had partial response and new lesions on recent MRI.  - Discussed that patient still has localized disease but it is multilobar and would probably require systemic immunotherapy for treatment - Will obtain a CT chest to rule out metastasis -Will obtain baseline labs today including AFP - Will refer to radiation oncology for assessment and see if patient is a candidate for local radiation.  This might be complicated since patient has multiple lesions involving all lobes. - If patient is not a candidate for radiation therapy, will consider dual agent immunotherapy with atezolizumab and bevacizumab.  Patient has a history of CVA in 2005, will discuss risk versus benefits at our next visit and consider obtaining an endoscopy prior to the start of bevacizumab.  Return to clinic after appointment with radiation oncology to discuss further management

## 2024-02-25 NOTE — Progress Notes (Signed)
 Patient Care Team: Jace Martinet, FNP as PCP - General (Family Medicine) Vinetta Greening, DO as Consulting Physician (Gastroenterology) Eduardo Grade, MD as Medical Oncologist (Medical Oncology) Gerhard Knuckles, RN as Oncology Nurse Navigator (Medical Oncology)  Clinic Day:  02/25/2024  Referring physician: Jace Martinet, FNP   CHIEF COMPLAINT:  CC: Hepatocellular carcinoma   ASSESSMENT & PLAN:   Assessment & Plan: Danny [name]  is a 58 y.o. male with hepatocellular carcinoma  Hepatocellular carcinoma (HCC) Patient has a history of hepatocellular carcinoma initially diagnosed in 10/22.  Patient is not a candidate for liver transplant secondary to cirrhosis and hepatitis C. S/p TARE and Y90 in 03/14/2022 and 08/24/2022 respectively.   MRI from 11/27/2023 showed progression of disease Patient had chemo embolization on 12/18/2023 and had partial response and new lesions on recent MRI.  - Discussed that patient still has localized disease but it is multilobar and would probably require systemic immunotherapy for treatment - Will obtain a CT chest to rule out metastasis -Will obtain baseline labs today including AFP - Will refer to radiation oncology for assessment and see if patient is a candidate for local radiation.  This might be complicated since patient has multiple lesions involving all lobes. - If patient is not a candidate for radiation therapy, will consider dual agent immunotherapy with atezolizumab and bevacizumab.  Patient has a history of CVA in 2005, will discuss risk versus benefits at our next visit and consider obtaining an endoscopy prior to the start of bevacizumab.  Return to clinic after appointment with radiation oncology to discuss further management  Hepatitis C Patient has a history of hepatitis C that is not treated.  - Follow-up with Dr.[name]  Tobacco use Current use of half a pack per day, reduced from two packs per day. Previous  discussions about smoking cessation.  - Encouraged attempts to cut down smoking    The patient understands the plans discussed today and is in agreement with them.  He knows to contact our office if he develops concerns prior to his next appointment.  I provided 30 minutes of face-to-face time during this encounter and > 50% was spent counseling as documented under my assessment and plan.    Eduardo Grade, MD  [facility] [place] - A DEPT OF [name] [address]  [place] [address] Dept: [phone] Dept Fax: [phone]   Orders Placed This Encounter  Procedures   CT Chest W Contrast    Standing Status:   Future    Expected Date:   02/25/2024    Expiration Date:   02/24/2025    If indicated for the ordered procedure, I authorize the administration of contrast media per Radiology protocol:   Yes    Does the patient have a contrast media/X-ray dye allergy?:   No    Preferred imaging location?:   [facility]   CBC with Differential/Platelet    Standing Status:   Future    Number of Occurrences:   1    Expected Date:   02/25/2024    Expiration Date:   05/25/2024   Comprehensive metabolic panel with GFR    Standing Status:   Future    Number of Occurrences:   1    Expected Date:   02/25/2024    Expiration Date:   05/25/2024   AFP tumor marker    Standing Status:   Future    Number of Occurrences:  1    Expected Date:   02/25/2024    Expiration Date:   05/25/2024   Protime-INR    Standing Status:   Future    Number of Occurrences:   1    Expected Date:   02/25/2024    Expiration Date:   05/25/2024     ONCOLOGY HISTORY:   Oncology History  Hepatocellular carcinoma (HCC)  06/29/2021 Imaging   MRI liver with and without contrast:  Arterially hyperenhancing mass of the central posterior left lobe of the liver, hepatic segment II/III, measuring 2.7 x 2.1 cm, with some evidence of washout and capsular  enhancement. LI-RADS category 5, consistent with hepatocellular carcinoma. 2. Additional subtle arterially hyperenhancing lesion of the central liver dome, hepatic segment VIII, measuring 1.1 cm, without evidence of washout or capsule. LI-RADS category 3, intermediate suspicion for hepatocellular carcinoma. Attention on follow-up.   06/29/2021 Initial Diagnosis   Hepatocellular carcinoma (HCC)   01/18/2022 Imaging   MRI abdomen with and without contrast:   IMPRESSION: Decreased size and lack of internal enhancement of treated mass in segment 3 of the left lobe. (LR-TR Nonviable)   Increased size of 15 mm hypervascular lesion in the liver dome. (LI-RADS Category 5: Definitely HCC)   9 mm hypervascular lesion near the junction of the right and left lobes, not seen on prior exam. (LI-RADS Category 3: Intermediate probability of malignancy)   No evidence of abdominal metastatic disease.   Stable splenomegaly and portosystemic collaterals, consistent with portal venous hypertension.   03/14/2022 Procedure   Segment 8 radiation segmentectomy- Right Y90 embolization   07/18/2022 Imaging   MRI abdomen with and without contrast:  1. Low-level, diffuse hyperenhancement of the left lobe of the liver as well as hepatic segment VIII with associated atrophy, but without suspicious focal hyperenhancement. Findings are consistent treated multifocal hepatocellular carcinoma without evidence of residual viable disease. LI-RADS TR, nonviable. 2. No new liver lesions. 3. No evidence of lymphadenopathy or metastatic disease in the abdomen. 4. Splenomegaly and large splenic varices.   08/24/2022 Procedure   Left hepatic TARE   05/23/2023 Imaging   MRI abdomen with and without contrast:  IMPRESSION: 1. Unchanged post treatment atrophy of the left lobe of the liver as well as hepatic segment VIII, with associated post treatment hyperemia following radio embolization. No suspicious associated contrast  enhancement. LI-RADS TR, nonviable. 2. Unchanged, somewhat ill-defined arterial hyperenhancement in hepatic segment IVB measuring 1.7 x 1.5 cm, on today's examination with some evidence of heterogeneous internal washout, but without evidence of capsular enhancement. This is technically characterized as LI-RADS category 4, suspicious for an additional focus of hepatocellular carcinoma and warrants close attention on follow-up. 3. Multiple additional unchanged arterially hyperenhancing lesions scattered throughout the liver measuring up to 1.3 cm, which were new on prior examination dated 11/27/2022 but unchanged in the immediate interval. These may reflect dystrophic or regenerative nodularity or alternately additional foci of hepatocellular carcinoma, LI-RADS category 3, intermediate suspicion for hepatocellular carcinoma. Recommend follow-up in 6 months. 4. Cirrhosis and stigmata portal hypertension including splenomegaly and varices.    05/23/2023 Tumor Marker   AFP: 7.0   11/12/2023 Imaging   MRI abdomen with and without contrast:  IMPRESSION: 1. Redemonstration of multiple liver lesions, as described above. 2. There is a 1.2 x 1.4 cm LI-RADS 5 lesion in the left hepatic lobe, segment 4B, which is slightly decreased in size since the prior study. 3. There are 2, LI-RADS 5 lesions in the right hepatic dome, which are  slightly increased in size since the prior study. 4. There are 3, LI-RADS 4 lesions in the right hepatic lobe, segments 6/7, which are grossly stable. 5. There is posttreatment cavity in the right hepatic dome, at the junctions of segment 8 and 4A a measuring approximately 2.1 x 2.2 cm. The cavity exhibits peripheral irregular enhancing wall which exhibits washout on delayed/equilibrium phase images, favoring viable tumor. 6. No new liver lesion seen.   11/12/2023 Cancer Staging   Staging form: Liver, AJCC 8th Edition - Clinical stage from 11/12/2023: Stage IB (rcT1b, cN0, cM0) - Signed  by Eduardo Grade, MD on 11/21/2023 Stage prefix: Recurrence   01/29/2024 Imaging   CT abdomen and pelvis with contrast:  Suspected multifocal HCC, including: -2.4 cm rim-enhancing lesion at the junction of segments (series 6/image 12), previously 2.2 cm  -1.6 cm enhancing lesion medially in segment 7 (series 6/image 7), previously 1.4 cm  -1.6 cm vague enhancing lesion in segment 4b (series 6/image 29), previously 1.4 cm   Additionally, there is a 2.6 cm irregular enhancing lesion in segment 2 (series 6 / image 14) obscured on prior MR, favoring an additional lesion.   IMPRESSION: 1. Suspected multifocal HCC, favored to be overall similar to prior MR, noting differences in technique. 2. Cirrhosis with moderate splenomegaly.   02/25/2024 Cancer Staging   Staging form: Liver, AJCC 8th Edition - Pathologic stage from 02/25/2024: ycT2(m), ycN0, cM0 - Signed by Eduardo Grade, MD on 02/25/2024 Stage prefix: Post-therapy Response to neoadjuvant therapy: Partial response Bilateral cancer: Yes Multiple tumors: Yes       Current Treatment:  TBD  INTERVAL HISTORY:  WISSAM RESOR is here today for follow up. Patient is accompanied by his niece today.He experiences abdominal pain, which he manages with oxycodone  10 mg. He needs to ration his medication, especially for nighttime pain. He attends a pain clinic in [place] for management. He denies fevers or chills. He denies pain. His appetite is good. His weight has been stable.  Socially, he is a former heavy smoker, now reduced to half a pack per day. He lives with his niece and remains active around the house, engaging in activities like tinkering in the yard and spending time with his grandchildren. He is motivated to continue treatment to be present for his family, particularly his grandchildren.  No recent nausea, vomiting, or blood in stools. No recent strokes or serious effects from past stroke.  I have reviewed the past medical  history, past surgical history, social history and family history with the patient and they are unchanged from previous note.  ALLERGIES:  is allergic to ultram  [tramadol ], benadryl  [diphenhydramine ], and trazodone.  MEDICATIONS:  Current Outpatient Medications  Medication Sig Dispense Refill   metFORMIN  (GLUCOPHAGE ) 1000 MG tablet Take 1,000 mg by mouth 2 (two) times daily.     albuterol  (VENTOLIN  HFA) 108 (90 Base) MCG/ACT inhaler Inhale 2 puffs into the lungs every 6 (six) hours as needed for wheezing or shortness of breath.     ALPRAZolam (XANAX) 0.25 MG tablet Take 0.25 mg by mouth at bedtime.     Cholecalciferol  (VITAMIN D -3) 125 MCG (5000 UT) TABS Take 5,000 Units by mouth daily.     citalopram  (CELEXA ) 20 MG tablet Take 20 mg by mouth daily.     cyclobenzaprine  (FLEXERIL ) 5 MG tablet Take 5 mg by mouth daily as needed for muscle spasms.     gabapentin  (NEURONTIN ) 600 MG tablet Take 1 tablet (600 mg total) by mouth in the  morning and at bedtime. 180 tablet 1   Melatonin 10 MG CAPS Take 10 mg by mouth at bedtime.     mirtazapine (REMERON) 7.5 MG tablet Take 1 tablet every day by oral route for 90 days.     naloxone (NARCAN) nasal spray 4 mg/0.1 mL Place 1 spray into the nose once.     oxyCODONE -acetaminophen  (PERCOCET) 10-325 MG tablet Take 1 tablet by mouth every 4 (four) hours as needed for pain. 30 tablet 0   pantoprazole  (PROTONIX ) 40 MG tablet Take 1 tablet (40 mg total) by mouth daily. 30 tablet 2   QUEtiapine  (SEROQUEL ) 100 MG tablet Take 100 mg by mouth at bedtime.     rosuvastatin  (CRESTOR ) 10 MG tablet Take 1 tablet (10 mg total) by mouth daily. 30 tablet 11   Sofosbuvir-Velpatasvir 400-100 MG TABS Take 1 tablet by mouth daily.     Vitamin D , Ergocalciferol , (DRISDOL) 1.25 MG (50000 UNIT) CAPS capsule Take 1 capsule by mouth once a week.     No current facility-administered medications for this visit.    REVIEW OF SYSTEMS:   Constitutional: Denies fevers, chills or  abnormal weight loss Eyes: Denies blurriness of vision Ears, nose, mouth, throat, and face: Denies mucositis or sore throat Respiratory: Denies cough, dyspnea or wheezes Cardiovascular: Denies palpitation, chest discomfort or lower extremity swelling Gastrointestinal:  Denies nausea, heartburn or change in bowel habits Skin: Denies abnormal skin rashes Lymphatics: Denies new lymphadenopathy or easy bruising Neurological:Denies numbness, tingling or new weaknesses Behavioral/Psych: Mood is stable, no new changes  All other systems were reviewed with the patient and are negative.   VITALS:  Blood pressure 126/68, pulse 98, temperature 97.7 F (36.5 C), temperature source Oral, resp. rate 19, weight 164 lb 14.5 oz (74.8 kg), SpO2 99%.  Wt Readings from Last 3 Encounters:  02/25/24 164 lb 14.5 oz (74.8 kg)  12/18/23 170 lb (77.1 kg)  11/21/23 173 lb 11.6 oz (78.8 kg)    Body mass index is 24.35 kg/m.  Performance status (ECOG): 2 - Symptomatic, <50% confined to bed  PHYSICAL EXAM:   GENERAL:alert, no distress and comfortable SKIN: skin color, texture, turgor are normal, no rashes or significant lesions LUNGS: clear to auscultation and percussion with normal breathing effort HEART: regular rate & rhythm and no murmurs and no lower extremity edema ABDOMEN:abdomen soft, non-tender and normal bowel sounds Musculoskeletal:no cyanosis of digits and no clubbing  NEURO: alert & oriented x 3 with fluent speech  LABORATORY DATA:  I have reviewed the data as listed    Component Value Date/Time   NA 136 02/25/2024 0943   K 3.8 02/25/2024 0943   CL 104 02/25/2024 0943   CO2 22 02/25/2024 0943   GLUCOSE 202 (H) 02/25/2024 0943   BUN 9 02/25/2024 0943   CREATININE 0.91 02/25/2024 0943   CREATININE 0.83 06/11/2021 0907   CALCIUM  9.7 02/25/2024 0943   PROT 7.7 02/25/2024 0943   ALBUMIN  3.6 02/25/2024 0943   AST 30 02/25/2024 0943   ALT 21 02/25/2024 0943   ALKPHOS 138 (H) 02/25/2024  0943   BILITOT 0.9 02/25/2024 0943   GFRNONAA >60 02/25/2024 0943   GFRAA >60 05/24/2019 1858   Lab Results  Component Value Date   WBC 5.4 02/25/2024   NEUTROABS 3.5 02/25/2024   HGB 15.8 02/25/2024   HCT 45.0 02/25/2024   MCV 91.1 02/25/2024   PLT 112 (L) 02/25/2024      Chemistry      Component Value Date/Time  NA 136 02/25/2024 0943   K 3.8 02/25/2024 0943   CL 104 02/25/2024 0943   CO2 22 02/25/2024 0943   BUN 9 02/25/2024 0943   CREATININE 0.91 02/25/2024 0943   CREATININE 0.83 06/11/2021 0907      Component Value Date/Time   CALCIUM  9.7 02/25/2024 0943   ALKPHOS 138 (H) 02/25/2024 0943   AST 30 02/25/2024 0943   ALT 21 02/25/2024 0943   BILITOT 0.9 02/25/2024 0943       RADIOGRAPHIC STUDIES: I have personally reviewed the radiological images as listed and agreed with the findings in the report.  CT ABDOMEN W WO CONTRAST Result Date: 02/06/2024 EXAM: CT ABDOMEN WITH AND WITHOUT CONTRAST 01/29/2024 09:29:39 AM TECHNIQUE: CT of the abdomen was performed with and without the administration of 100mL iohexol  (OMNIPAQUE ) 300 MG/ML solution. Multiplanar reformatted images are provided for review. Automated exposure control, iterative reconstruction, and/or weight based adjustment of the mA/kV was utilized to reduce the radiation dose to as low as reasonably achievable. COMPARISON: MR abdomen dated 11/12/2023. CLINICAL HISTORY: Hepatocellular carcinoma (HCC). FINDINGS: LOWER CHEST: Visualized portion of the lower chest demonstrates no acute abnormality. HEPATOBILIARY: Cirrhosis. Suspected multifocal HCC, including: --2.4 cm rim-enhancing lesion at the junction of segments (series 6/image 12), previously 2.2 cm --1.6 cm enhancing lesion medially in segment 7 (series 6/image 7), previously 1.4 cm --1.6 cm vague enhancing lesion in segment 4b (series 6/image 29), previously 1.4 cm Additionally, there is a 2.6 cm irregular enhancing lesion in segment 2 (series 6 / image 14)  obscured on prior MR, favoring an additional lesion. However, additional small lesions noted on prior MRI and segment 5, 6, and 7 are not well visualized on the current study. Overall, when accounting for differences in technique, the appearance is favored to be similar to prior MRI. SPLEEN: Moderate splenomegaly, measuring 18.4 cm in craniocaudal dimension. PANCREAS: Pancreas demonstrates no acute abnormality. ADRENAL GLANDS: Adrenal glands demonstrate no acute abnormality. KIDNEYS: Bilateral nonobstructing renal calculi, 4 on the right and 2 on the left, measuring up to 3 mm. 19 mm simple left lower pole renal cyst (series 2/image 34), benign (Bosniak 1). No follow-up is recommended. GI AND BOWEL: Stomach and duodenal sweep demonstrate no acute abnormality. There is no bowel obstruction. No abnormal bowel wall thickening or distension. PERITONEUM AND RETROPERITONEUM: No ascites or free air. Aorta is normal in caliber. LYMPH NODES: No lymphadenopathy. BONES AND SOFT TISSUES: No acute abnormality of the visualized bones. No focal soft tissue abnormality. VASCULATURE: Atherosclerotic calcifications of the abdominal aorta and branch vessels. The portal vein is patent. Splenorenal shunt. IMPRESSION: 1. Suspected multifocal HCC, favored to be overall similar to prior MR, noting differences in technique. 2. Cirrhosis with moderate splenomegaly. 3. Small bilateral nonobstructing renal calculi, as above. Electronically signed by: Zadie Herter MD 02/06/2024 11:12 PM EDT RP Workstation: [id number]   IR Radiologist Eval & Mgmt Result Date: 02/06/2024 EXAM: ESTABLISHED PATIENT OFFICE VISIT CHIEF COMPLAINT: See Epic note. HISTORY OF PRESENT ILLNESS: See Epic note. REVIEW OF SYSTEMS: See Epic note. PHYSICAL EXAMINATION: See Epic note. ASSESSMENT AND PLAN: See Epic note. Creasie Doctor, MD [facility] Electronically Signed   By: Creasie Doctor M.D.   On: 02/06/2024  16:00

## 2024-02-25 NOTE — Assessment & Plan Note (Signed)
 Patient has a history of hepatitis C that is not treated.  - Follow-up with Dr.[name]

## 2024-02-25 NOTE — Patient Instructions (Signed)
 VISIT SUMMARY:  You came in today to discuss treatment options for your liver cancer. You were accompanied by your niece. We reviewed your history, including your previous treatments and current symptoms. We also discussed your family history and social habits, as well as your motivation to continue treatment for your family.  YOUR PLAN:  -LIVER CANCER WITH METASTASIS: Your liver cancer has progressed with multiple lesions. We are considering systemic treatment, but prefer immunotherapy due to its better tolerability and fewer side effects. We discussed the potential side effects of immunotherapy. We will evaluate your eligibility for radiation therapy to potentially avoid systemic treatment. There is an increased risk of bleeding and stroke with the proposed treatment regimen, especially given your family history of stroke. We will refer you to a radiation oncologist in [place], order a chest CT to check for chest metastasis, schedule an endoscopy to evaluate for blood vessels that may increase bleeding risk, and perform blood work today.  -TOBACCO USE: You currently smoke half a pack of cigarettes per day, reduced from two packs per day. We have previously discussed smoking cessation.  -GOALS OF CARE: You are motivated by your family, especially your grandchildren, and are willing to pursue treatment options to prolong your life and maintain your activity level.  INSTRUCTIONS:  We will evaluate your treatment options and radiation therapy eligibility. Please schedule a follow-up appointment in two weeks after your consultation with the radiation oncologist and chest CT. We will also confirm your liver transplant candidacy with the liver transplant doctor.

## 2024-02-25 NOTE — Assessment & Plan Note (Signed)
 Current use of half a pack per day, reduced from two packs per day. Previous discussions about smoking cessation.  - Encouraged attempts to cut down smoking

## 2024-02-26 LAB — AFP TUMOR MARKER: AFP, Serum, Tumor Marker: 21 ng/mL — ABNORMAL HIGH (ref 0.0–8.4)

## 2024-03-04 ENCOUNTER — Emergency Department (HOSPITAL_COMMUNITY)

## 2024-03-04 ENCOUNTER — Ambulatory Visit (HOSPITAL_COMMUNITY)

## 2024-03-04 ENCOUNTER — Other Ambulatory Visit: Payer: Self-pay

## 2024-03-04 ENCOUNTER — Emergency Department (HOSPITAL_COMMUNITY)
Admission: EM | Admit: 2024-03-04 | Discharge: 2024-03-05 | Disposition: A | Discharge status: Home / Self Care | Attending: Student | Admitting: Student

## 2024-03-04 DIAGNOSIS — W540XXA Bitten by dog, initial encounter: Secondary | ICD-10-CM | POA: Diagnosis not present

## 2024-03-04 DIAGNOSIS — I10 Essential (primary) hypertension: Secondary | ICD-10-CM | POA: Insufficient documentation

## 2024-03-04 DIAGNOSIS — Z859 Personal history of malignant neoplasm, unspecified: Secondary | ICD-10-CM | POA: Diagnosis not present

## 2024-03-04 DIAGNOSIS — E119 Type 2 diabetes mellitus without complications: Secondary | ICD-10-CM | POA: Insufficient documentation

## 2024-03-04 DIAGNOSIS — S41131A Puncture wound without foreign body of right upper arm, initial encounter: Secondary | ICD-10-CM | POA: Insufficient documentation

## 2024-03-04 DIAGNOSIS — S41132A Puncture wound without foreign body of left upper arm, initial encounter: Secondary | ICD-10-CM | POA: Insufficient documentation

## 2024-03-04 DIAGNOSIS — Z7984 Long term (current) use of oral hypoglycemic drugs: Secondary | ICD-10-CM | POA: Insufficient documentation

## 2024-03-04 DIAGNOSIS — S71132A Puncture wound without foreign body, left thigh, initial encounter: Secondary | ICD-10-CM | POA: Insufficient documentation

## 2024-03-04 DIAGNOSIS — S81831A Puncture wound without foreign body, right lower leg, initial encounter: Secondary | ICD-10-CM | POA: Insufficient documentation

## 2024-03-04 DIAGNOSIS — Z203 Contact with and (suspected) exposure to rabies: Secondary | ICD-10-CM | POA: Diagnosis not present

## 2024-03-04 DIAGNOSIS — Z23 Encounter for immunization: Secondary | ICD-10-CM | POA: Diagnosis not present

## 2024-03-04 DIAGNOSIS — T07XXXA Unspecified multiple injuries, initial encounter: Secondary | ICD-10-CM

## 2024-03-04 DIAGNOSIS — S8991XA Unspecified injury of right lower leg, initial encounter: Secondary | ICD-10-CM | POA: Diagnosis present

## 2024-03-04 DIAGNOSIS — J449 Chronic obstructive pulmonary disease, unspecified: Secondary | ICD-10-CM | POA: Diagnosis not present

## 2024-03-04 DIAGNOSIS — S81832A Puncture wound without foreign body, left lower leg, initial encounter: Secondary | ICD-10-CM | POA: Diagnosis not present

## 2024-03-04 DIAGNOSIS — Z2914 Encounter for prophylactic rabies immune globin: Secondary | ICD-10-CM | POA: Insufficient documentation

## 2024-03-04 DIAGNOSIS — F17210 Nicotine dependence, cigarettes, uncomplicated: Secondary | ICD-10-CM | POA: Insufficient documentation

## 2024-03-04 MED ORDER — TETANUS-DIPHTH-ACELL PERTUSSIS 5-2.5-18.5 LF-MCG/0.5 IM SUSY
0.5000 mL | PREFILLED_SYRINGE | Freq: Once | INTRAMUSCULAR | Status: AC
  Administered 2024-03-04: 0.5 mL via INTRAMUSCULAR
  Filled 2024-03-04: qty 0.5

## 2024-03-04 MED ORDER — AMOXICILLIN-POT CLAVULANATE 875-125 MG PO TABS: 1.0000 | ORAL_TABLET | Freq: Two times a day (BID) | ORAL | 0 refills | Status: DC

## 2024-03-04 MED ORDER — OXYCODONE-ACETAMINOPHEN 5-325 MG PO TABS: 1.0000 | ORAL_TABLET | Freq: Four times a day (QID) | ORAL | 0 refills | Status: DC | PRN

## 2024-03-04 MED ORDER — HYDROCODONE-ACETAMINOPHEN 5-325 MG PO TABS
1.0000 | ORAL_TABLET | Freq: Once | ORAL | Status: AC
  Administered 2024-03-04: 1 via ORAL
  Filled 2024-03-04: qty 1

## 2024-03-04 MED ORDER — OXYCODONE-ACETAMINOPHEN 10-325 MG PO TABS: 1.0000 | ORAL_TABLET | ORAL | 0 refills | Status: DC | PRN

## 2024-03-04 MED ORDER — RABIES VACCINE, PCEC IM SUSR
1.0000 mL | Freq: Once | INTRAMUSCULAR | Status: AC
  Administered 2024-03-04: 1 mL via INTRAMUSCULAR
  Filled 2024-03-04: qty 1

## 2024-03-04 MED ORDER — RABIES IMMUNE GLOBULIN 1500 UNIT/10ML IJ SOLN
20.0000 [IU]/kg | Freq: Once | INTRAMUSCULAR | Status: AC
  Administered 2024-03-04: 1500 [IU] via INTRAMUSCULAR
  Filled 2024-03-04: qty 10

## 2024-03-04 MED ORDER — AMOXICILLIN-POT CLAVULANATE 875-125 MG PO TABS
1.0000 | ORAL_TABLET | Freq: Once | ORAL | Status: AC
  Administered 2024-03-04: 1 via ORAL
  Filled 2024-03-04: qty 1

## 2024-03-04 NOTE — ED Notes (Signed)
 Animal bites cleaned with saline.

## 2024-03-04 NOTE — ED Notes (Signed)
 This RN notified [place] (PD) at [facility]; [place] PD en route to ED.

## 2024-03-04 NOTE — ED Notes (Signed)
 ED Provider at bedside.

## 2024-03-04 NOTE — ED Triage Notes (Addendum)
 Pt was walking in their neighborhood when attacked by 4 dogs. Pt fell to the ground. Pt endorses multiple bite wounds and lacerations to bilateral arms and legs. Bleeding controlled at this time. Dogs are unknown to pt. Pulses in tact in all extremities. Pt believes is up to date on tetanus shot

## 2024-03-05 NOTE — ED Provider Notes (Signed)
 [place] [facility] AT [facility] Provider Note  CSN: [id number] Arrival date & time: 03/04/24 1920  Chief Complaint(s) Animal Bite  HPI Caydence CUARESMA is a 58 y.o. male with PMH alcohol abuse, hepatitis C, hepatocellular carcinoma, migraines, CVA who presents Emergency Department for evaluation of multiple dog bites.  Patient states that he was walking when he was attacked by multiple dogs and bit multiple times.  He arrives with multiple puncture wounds over his legs and arms.  Unsure of when his last tetanus shot was.  States he does not know the dog's and the dog's owners are unable to be contacted to evaluate for vaccination status.   Past Medical History Past Medical History:  Diagnosis Date   Alcohol abuse    Anxiety    Back pain    Cancer (HCC)    Cirrhosis (HCC)    CVA (cerebral infarction)    Depression    Diabetes mellitus    Hepatitis C    Hypertension    Migraines    Stroke [facility])    Patient Active Problem List   Diagnosis Date Noted   Tobacco use 02/25/2024   S/P aortogram 05/06/2022   PAD (peripheral artery disease) (HCC) 05/06/2022   Wound infection after surgery 09/27/2021   Postoperative wound cellulitis 09/26/2021   Rupture of right quadriceps tendon    Hepatitis C 08/08/2021   Hepatocellular carcinoma (HCC) 08/08/2021   COPD (chronic obstructive pulmonary disease) (HCC) 08/08/2021   Hypertension 08/08/2021   Other cirrhosis of liver (HCC) 08/08/2021   Type 2 diabetes mellitus (HCC) 08/08/2021   Generalized abdominal pain 06/06/2021   Dysphagia 06/06/2021   Gastroesophageal reflux disease 06/06/2021   Abnormal liver ultrasound 06/06/2021   Colon cancer screening 06/06/2021   Elevated LFTs 06/06/2021   GREATER TROCHANTERIC BURSITIS 07/03/2007   INCREASED BLOOD PRESSURE 07/03/2007   BACK PAIN, LUMBAR 05/29/2007   NUMBNESS 05/29/2007   INSOMNIA, CHRONIC 05/13/2007   HYPERLIPIDEMIA 01/13/2007   ANXIETY DEPRESSION 01/13/2007    SYNDROME, CHRONIC PAIN 01/13/2007   Cerebral artery occlusion with cerebral infarction (HCC) 01/13/2007   Arthropathy 01/13/2007   Home Medication(s) Prior to Admission medications   Medication Sig Start Date End Date Taking? Authorizing Provider  amoxicillin -clavulanate (AUGMENTIN ) 875-125 MG tablet Take 1 tablet by mouth every 12 (twelve) hours. 03/04/24  Yes Maryagnes Carrasco, MD  oxyCODONE -acetaminophen  (PERCOCET/ROXICET) 5-325 MG tablet Take 1 tablet by mouth every 6 (six) hours as needed for severe pain (pain score 7-10). 03/04/24  Yes Mihcael Ledee, MD  albuterol  (VENTOLIN  HFA) 108 (90 Base) MCG/ACT inhaler Inhale 2 puffs into the lungs every 6 (six) hours as needed for wheezing or shortness of breath.    [provider]  ALPRAZolam (XANAX) 0.25 MG tablet Take 0.25 mg by mouth at bedtime.    [provider]  Cholecalciferol  (VITAMIN D -3) 125 MCG (5000 UT) TABS Take 5,000 Units by mouth daily.    [provider]  citalopram  (CELEXA ) 20 MG tablet Take 20 mg by mouth daily.    [provider]  cyclobenzaprine  (FLEXERIL ) 5 MG tablet Take 5 mg by mouth daily as needed for muscle spasms. 03/06/23   [provider]  gabapentin  (NEURONTIN ) 600 MG tablet Take 1 tablet (600 mg total) by mouth in the morning and at bedtime. 10/29/22   Margrette Taft BRAVO, MD  Melatonin 10 MG CAPS Take 10 mg by mouth at bedtime.    [provider]  metFORMIN  (GLUCOPHAGE ) 1000 MG tablet Take 1,000 mg  by mouth 2 (two) times daily. 02/10/24   [provider]  mirtazapine (REMERON) 7.5 MG tablet Take 1 tablet every day by oral route for 90 days. 11/17/23   [provider]  naloxone [facility]) nasal spray 4 mg/0.1 mL Place 1 spray into the nose once. 01/01/22   [provider]  oxyCODONE -acetaminophen  (PERCOCET) 10-325 MG tablet Take 1 tablet by mouth every 4 (four) hours as needed for pain. 03/04/24   Trino Higinbotham, MD  pantoprazole  (PROTONIX ) 40  MG tablet Take 1 tablet (40 mg total) by mouth daily. 05/07/22 05/07/23  Bethanie Cough, PA-C  QUEtiapine  (SEROQUEL ) 100 MG tablet Take 100 mg by mouth at bedtime.    [provider]  rosuvastatin  (CRESTOR ) 10 MG tablet Take 1 tablet (10 mg total) by mouth daily. 05/06/22 05/06/23  Sheree Penne Bruckner, MD  Sofosbuvir-Velpatasvir 400-100 MG TABS Take 1 tablet by mouth daily.    [provider]                                                                                                                                    Past Surgical History Past Surgical History:  Procedure Laterality Date   ABDOMINAL AORTOGRAM W/LOWER EXTREMITY Right 05/06/2022   Procedure: ABDOMINAL AORTOGRAM W/LOWER EXTREMITY;  Surgeon: Sheree Penne Bruckner, MD;  Location: [facility] INVASIVE CV LAB;  Service: Cardiovascular;  Laterality: Right;   CAST APPLICATION Right 11/02/2021   Procedure: CAST APPLICATION;  Surgeon: Margrette Taft BRAVO, MD;  Location: [facility] ORS;  Service: Orthopedics;  Laterality: Right;   ESOPHAGOGASTRODUODENOSCOPY (EGD) WITH PROPOFOL  N/A 09/16/2022   Procedure: ESOPHAGOGASTRODUODENOSCOPY (EGD) WITH PROPOFOL ;  Surgeon: Cindie Carlin POUR, DO;  Location: [facility] ENDO SUITE;  Service: Endoscopy;  Laterality: N/A;  12:30pm, asa 3, pt knows to arrive at 6:45   FOOT SURGERY     INCISION AND DRAINAGE Right 11/02/2021   Procedure: INCISION AND DRAINAGE RIGHT KNEE;  Surgeon: Margrette Taft BRAVO, MD;  Location: [facility] ORS;  Service: Orthopedics;  Laterality: Right;   IR 3D INDEPENDENT WKST  10/11/2021   IR 3D INDEPENDENT WKST  02/28/2022   IR 3D INDEPENDENT WKST  03/14/2022   IR 3D INDEPENDENT WKST  12/18/2023   IR 3D INDEPENDENT WKST  12/18/2023   IR 3D INDEPENDENT WKST  12/18/2023   IR ANGIOGRAM SELECTIVE EACH ADDITIONAL VESSEL  10/11/2021   IR ANGIOGRAM SELECTIVE EACH ADDITIONAL VESSEL  10/11/2021   IR ANGIOGRAM SELECTIVE EACH ADDITIONAL VESSEL  10/11/2021   IR ANGIOGRAM SELECTIVE EACH ADDITIONAL VESSEL  10/11/2021    IR ANGIOGRAM SELECTIVE EACH ADDITIONAL VESSEL  10/11/2021   IR ANGIOGRAM SELECTIVE EACH ADDITIONAL VESSEL  10/11/2021   IR ANGIOGRAM SELECTIVE EACH ADDITIONAL VESSEL  10/25/2021   IR ANGIOGRAM SELECTIVE EACH ADDITIONAL VESSEL  10/25/2021   IR ANGIOGRAM SELECTIVE EACH ADDITIONAL VESSEL  02/28/2022   IR ANGIOGRAM SELECTIVE EACH ADDITIONAL VESSEL  02/28/2022   IR ANGIOGRAM SELECTIVE EACH ADDITIONAL VESSEL  02/28/2022  IR ANGIOGRAM SELECTIVE EACH ADDITIONAL VESSEL  03/14/2022   IR ANGIOGRAM SELECTIVE EACH ADDITIONAL VESSEL  12/18/2023   IR ANGIOGRAM SELECTIVE EACH ADDITIONAL VESSEL  12/18/2023   IR ANGIOGRAM VISCERAL SELECTIVE  10/11/2021   IR ANGIOGRAM VISCERAL SELECTIVE  02/28/2022   IR ANGIOGRAM VISCERAL SELECTIVE  03/14/2022   IR ANGIOGRAM VISCERAL SELECTIVE  12/18/2023   IR EMBO TUMOR ORGAN ISCHEMIA INFARCT INC GUIDE ROADMAPPING  10/25/2021   IR EMBO TUMOR ORGAN ISCHEMIA INFARCT INC GUIDE ROADMAPPING  10/25/2021   IR EMBO TUMOR ORGAN ISCHEMIA INFARCT INC GUIDE ROADMAPPING  03/14/2022   IR EMBO TUMOR ORGAN ISCHEMIA INFARCT INC GUIDE ROADMAPPING  12/18/2023   IR RADIOLOGIST EVAL & MGMT  08/23/2021   IR RADIOLOGIST EVAL & MGMT  11/21/2021   IR RADIOLOGIST EVAL & MGMT  01/24/2022   IR RADIOLOGIST EVAL & MGMT  04/09/2022   IR RADIOLOGIST EVAL & MGMT  08/05/2022   IR RADIOLOGIST EVAL & MGMT  11/29/2022   IR RADIOLOGIST EVAL & MGMT  06/11/2023   IR RADIOLOGIST EVAL & MGMT  11/17/2023   IR RADIOLOGIST EVAL & MGMT  02/06/2024   IR US  GUIDE VASC ACCESS RIGHT  10/11/2021   IR US  GUIDE VASC ACCESS RIGHT  10/25/2021   IR US  GUIDE VASC ACCESS RIGHT  02/28/2022   IR US  GUIDE VASC ACCESS RIGHT  03/14/2022   IR US  GUIDE VASC ACCESS RIGHT  12/18/2023   MANDIBLE SURGERY     PERIPHERAL VASCULAR INTERVENTION Right 05/06/2022   Procedure: PERIPHERAL VASCULAR INTERVENTION;  Surgeon: Sheree Penne Bruckner, MD;  Location: [facility] INVASIVE CV LAB;  Service: Cardiovascular;  Laterality: Right;  SFA   QUADRICEPS TENDON REPAIR Right 09/04/2021    Procedure: REPAIR QUADRICEP TENDON;  Surgeon: Margrette Taft BRAVO, MD;  Location: [facility] ORS;  Service: Orthopedics;  Laterality: Right;   TENDON EXPLORATION Right 11/02/2021   Procedure: QUADRICEPS TENDON AND PATELLAR TENDON EXPLORATION;  Surgeon: Margrette Taft BRAVO, MD;  Location: [facility] ORS;  Service: Orthopedics;  Laterality: Right;   Family History Family History  Problem Relation Age of Onset   Heart failure Mother    Stroke Father    Stroke Sister    Cancer Sister        breast cancer   Colon cancer Neg Hx    Liver cancer Neg Hx     Social History Social History   Tobacco Use   Smoking status: Every Day    Current packs/day: 0.50    Average packs/day: 0.5 packs/day for 20.0 years (10.0 ttl pk-yrs)    Types: Cigarettes    Passive exposure: Current   Smokeless tobacco: Never  Vaping Use   Vaping status: Never Used  Substance Use Topics   Alcohol use: Not Currently    Comment: Quit 11 months ago. Used to drink about 6 pack a day. (documented 06/06/21)   Drug use: Yes    Types: Marijuana    Comment: occas   Allergies Trazodone, Benadryl  [diphenhydramine ], and Ultram  [tramadol ]  Review of Systems Review of Systems  Skin:  Positive for wound.    Physical Exam Vital Signs  I have reviewed the triage vital signs BP (!) 147/76 (BP Location: Left Arm)   Pulse 94   Temp 98 F (36.7 C) (Oral)   Resp 18   Ht 5' 9 (1.753 m)   Wt 76.2 kg   SpO2 97%   BMI 24.81 kg/m   Physical Exam Constitutional:      General: He is not in acute distress.  Appearance: Normal appearance.  HENT:     Head: Normocephalic and atraumatic.     Nose: No congestion or rhinorrhea.   Eyes:     General:        Right eye: No discharge.        Left eye: No discharge.     Extraocular Movements: Extraocular movements intact.     Pupils: Pupils are equal, round, and reactive to light.    Cardiovascular:     Rate and Rhythm: Normal rate and regular rhythm.     Heart sounds: No murmur  heard. Pulmonary:     Effort: No respiratory distress.     Breath sounds: No wheezing or rales.  Abdominal:     General: There is no distension.     Tenderness: There is no abdominal tenderness.   Musculoskeletal:        General: Swelling and tenderness present. Normal range of motion.     Cervical back: Normal range of motion.   Skin:    General: Skin is warm and dry.     Findings: Bruising and lesion present.   Neurological:     General: No focal deficit present.     Mental Status: He is alert.     ED Results and Treatments Labs (all labs ordered are listed, but only abnormal results are displayed) Labs Reviewed - No data to display                                                                                                                        Radiology DG Knee Complete 4 Views Left Result Date: 03/04/2024 CLINICAL DATA:  dog bite r/o fx EXAM: LEFT KNEE - COMPLETE 4+ VIEW COMPARISON:  None Available. FINDINGS: No evidence of fracture, dislocation, or joint effusion. Mild patellofemoral and medial tibiofemoral joint space degenerative changes. No evidence of severe arthropathy or other focal bone abnormality. Soft tissues are unremarkable. No retained radiopaque foreign body. Vascular calcifications. IMPRESSION: 1. No acute displaced fracture or dislocation. 2. No retained radiopaque foreign body. Electronically Signed   By: Morgane  Naveau M.D.   On: 03/04/2024 22:13   DG Forearm Left Result Date: 03/04/2024 CLINICAL DATA:  dog bite r/o fx EXAM: LEFT FOREARM - 2 VIEW COMPARISON:  None Available. FINDINGS: There is no evidence of fracture or other focal bone lesions. Subcutaneus soft tissue edema and emphysema along the antecubital fossa. No retained radiopaque foreign body. Vascular calcification. IMPRESSION: 1. Subcutaneus soft tissue edema and emphysema along the antecubital fossa. No retained radiopaque foreign body. 2.  No acute displaced fracture or dislocation.  Electronically Signed   By: Morgane  Naveau M.D.   On: 03/04/2024 22:12   DG Tibia/Fibula Left Result Date: 03/04/2024 CLINICAL DATA:  dog bite r/o fx EXAM: LEFT TIBIA AND FIBULA - 2 VIEW COMPARISON:  None Available. FINDINGS: There is no evidence of fracture or other focal bone lesions. Trace proximal lateral leg subcutaneus soft tissue emphysema. No retained radiopaque foreign body. Vascular calcification.  IMPRESSION: 1. Trace proximal lateral leg subcutaneus soft tissue emphysema. No retained radiopaque foreign body. 2.  No acute displaced fracture or dislocation. Electronically Signed   By: Morgane  Naveau M.D.   On: 03/04/2024 22:11   DG Forearm Right Result Date: 03/04/2024 CLINICAL DATA:  r/o fx EXAM: RIGHT FOREARM - 2 VIEW COMPARISON:  None Available. FINDINGS: There is no evidence of fracture or other focal bone lesions. Subcutaneus soft tissue edema and emphysema along the proximal forearm soft tissues. Vascular calcifications. No retained radiopaque foreign body. IMPRESSION: 1.  No acute displaced fracture or dislocation. 2. Subcutaneus soft tissue edema and emphysema along the proximal forearm soft tissues. No retained radiopaque foreign body. Electronically Signed   By: Morgane  Naveau M.D.   On: 03/04/2024 22:09   DG Hand Complete Left Result Date: 03/04/2024 CLINICAL DATA:  dog bite r/o fx EXAM: LEFT HAND - COMPLETE 3+ VIEW COMPARISON:  None Available. FINDINGS: There is no evidence of fracture or dislocation. Old healed fifth metacarpal fracture. There is no evidence of arthropathy or other focal bone abnormality. Soft tissues are unremarkable. No retained radiopaque foreign body. IMPRESSION: Negative. Electronically Signed   By: Morgane  Naveau M.D.   On: 03/04/2024 21:56    Pertinent labs & imaging results that were available during my care of the patient were reviewed by me and considered in my medical decision making (see MDM for details).  Medications Ordered in ED Medications   HYDROcodone -acetaminophen  (NORCO/VICODIN) 5-325 MG per tablet 1 tablet (1 tablet Oral Given 03/04/24 2010)  HYDROcodone -acetaminophen  (NORCO/VICODIN) 5-325 MG per tablet 1 tablet (1 tablet Oral Given 03/04/24 2149)  Tdap (BOOSTRIX ) injection 0.5 mL (0.5 mLs Intramuscular Given 03/04/24 2248)  Rabies Immune Globulin  SOLN 1,500 Units (1,500 Units Intramuscular Given 03/04/24 2256)  rabies vaccine  (RABAVERT ) injection 1 mL (1 mL Intramuscular Given 03/04/24 2252)  amoxicillin -clavulanate (AUGMENTIN ) 875-125 MG per tablet 1 tablet (1 tablet Oral Given 03/04/24 2352)                                                                                                                                     Procedures .Laceration Repair  Date/Time: 03/05/2024 2:26 AM  Performed by: Albertina Dixon, MD Authorized by: Albertina Dixon, MD   Laceration details:    Location:  Leg   Leg location:  L lower leg   Length (cm):  2 Treatment:    Area cleansed with:  Saline   Amount of cleaning:  Standard   Irrigation solution:  Sterile saline Skin repair:    Repair method:  Steri-Strips   Number of Steri-Strips:  2 Approximation:    Approximation:  Loose Repair type:    Repair type:  Simple Post-procedure details:    Dressing:  Open (no dressing)   (including critical care time)  Medical Decision Making / ED Course   This patient presents to the ED for concern of dog bites, this involves an  extensive number of treatment options, and is a complaint that carries with it a high risk of complications and morbidity.  The differential diagnosis includes fracture, hematoma, contusion, ligamentous injury, laceration  MDM: Patient seen emergency room for evaluation of multiple dog bites.  Physical exam reveals multiple puncture wounds over bilateral upper extremities, left femur, left tib-fib.  There is 1 slightly gaping wound at the proximal tib-fib on the left which was loosely repaired with Steri-Strips.   Patient covered with Augmentin  and trauma imaging including x-rays of the left hand, bilateral forearms, left tib-fib and left knee reassuringly negative for fracture.  Local wound care provided to the other puncture wound sites and patient given the rabies vaccine  and immunoglobulin.  At this time he does not meet inpatient criteria for admission will be discharged with outpatient follow-up with instructions to return in 3 days for his second rabies vaccination.  Return precautions given of which he voiced understanding.   Additional history obtained: -External records from outside source obtained and reviewed including: Chart review including previous notes, labs, imaging, consultation notes    Imaging Studies ordered: I ordered imaging studies including knee x-ray, hip x-ray, bilateral forearm x-rays, x-ray I independently visualized and interpreted imaging. I agree with the radiologist interpretation   Medicines ordered and prescription drug management: Meds ordered this encounter  Medications   HYDROcodone -acetaminophen  (NORCO/VICODIN) 5-325 MG per tablet 1 tablet    Refill:  0   HYDROcodone -acetaminophen  (NORCO/VICODIN) 5-325 MG per tablet 1 tablet    Refill:  0   Tdap (BOOSTRIX ) injection 0.5 mL   Rabies Immune Globulin  SOLN 1,500 Units   rabies vaccine  (RABAVERT ) injection 1 mL   amoxicillin -clavulanate (AUGMENTIN ) 875-125 MG per tablet 1 tablet   amoxicillin -clavulanate (AUGMENTIN ) 875-125 MG tablet    Sig: Take 1 tablet by mouth every 12 (twelve) hours.    Dispense:  14 tablet    Refill:  0   oxyCODONE -acetaminophen  (PERCOCET) 10-325 MG tablet    Sig: Take 1 tablet by mouth every 4 (four) hours as needed for pain.    Dispense:  15 tablet    Refill:  0   oxyCODONE -acetaminophen  (PERCOCET/ROXICET) 5-325 MG tablet    Sig: Take 1 tablet by mouth every 6 (six) hours as needed for severe pain (pain score 7-10).    Dispense:  6 tablet    Refill:  0    -I have reviewed the  patients home medicines and have made adjustments as needed  Critical interventions none   Cardiac Monitoring: The patient was maintained on a cardiac monitor.  I personally viewed and interpreted the cardiac monitored which showed an underlying rhythm of: NSR  Social Determinants of Health:  Factors impacting patients care include: none   Reevaluation: After the interventions noted above, I reevaluated the patient and found that they have :improved  Co morbidities that complicate the patient evaluation  Past Medical History:  Diagnosis Date   Alcohol abuse    Anxiety    Back pain    Cancer (HCC)    Cirrhosis (HCC)    CVA (cerebral infarction)    Depression    Diabetes mellitus    Hepatitis C    Hypertension    Migraines    Stroke (HCC)       Dispostion: I considered admission for this patient, but at this time he does not meet inpatient criteria for admission and will be discharged with outpatient follow-up.     Final Clinical Impression(s) / ED Diagnoses Final diagnoses:  Dog bite, initial encounter  Multiple puncture wounds     @PCDICTATION @    Albertina Dixon, MD 03/05/24 [phone]

## 2024-03-05 NOTE — ED Notes (Signed)
 Patient's wounds cleansed with normal saline; applied xeroform and kerlix gauze to wound on right arm and left leg.

## 2024-03-07 MED FILL — Oxycodone w/ Acetaminophen Tab 5-325 MG: ORAL | Qty: 6 | Status: AC

## 2024-03-08 ENCOUNTER — Ambulatory Visit
Admission: EM | Admit: 2024-03-08 | Discharge: 2024-03-08 | Disposition: A | Discharge status: Home / Self Care | Attending: Nurse Practitioner | Admitting: Nurse Practitioner

## 2024-03-08 DIAGNOSIS — Z23 Encounter for immunization: Secondary | ICD-10-CM

## 2024-03-08 MED ORDER — RABIES VACCINE, PCEC IM SUSR
1.0000 mL | Freq: Once | INTRAMUSCULAR | Status: AC
  Administered 2024-03-08: 1 mL via INTRAMUSCULAR

## 2024-03-08 NOTE — ED Triage Notes (Signed)
 Pt reports to UC for 2nd rabies shot.

## 2024-03-09 ENCOUNTER — Encounter: Payer: Self-pay | Admitting: Oncology

## 2024-03-10 ENCOUNTER — Ambulatory Visit (HOSPITAL_COMMUNITY)
Admission: RE | Admit: 2024-03-10 | Discharge: 2024-03-10 | Disposition: A | Discharge status: Home / Self Care | Source: Ambulatory Visit | Attending: Oncology | Admitting: Oncology

## 2024-03-10 DIAGNOSIS — C220 Liver cell carcinoma: Secondary | ICD-10-CM | POA: Insufficient documentation

## 2024-03-10 MED ORDER — IOHEXOL 300 MG/ML  SOLN
80.0000 mL | Freq: Once | INTRAMUSCULAR | Status: AC | PRN
  Administered 2024-03-10: 80 mL via INTRAVENOUS

## 2024-03-12 ENCOUNTER — Ambulatory Visit
Admission: EM | Admit: 2024-03-12 | Discharge: 2024-03-12 | Disposition: A | Discharge status: Home / Self Care | Attending: Nurse Practitioner | Admitting: Nurse Practitioner

## 2024-03-12 DIAGNOSIS — Z23 Encounter for immunization: Secondary | ICD-10-CM

## 2024-03-12 MED ORDER — RABIES VACCINE, PCEC IM SUSR
1.0000 mL | Freq: Once | INTRAMUSCULAR | Status: AC
  Administered 2024-03-12: 1 mL via INTRAMUSCULAR

## 2024-03-12 NOTE — ED Triage Notes (Signed)
 Pt presents to UC for 3rd rabies series.

## 2024-03-16 ENCOUNTER — Inpatient Hospital Stay: Admitting: Oncology

## 2024-03-17 ENCOUNTER — Inpatient Hospital Stay: Attending: Oncology | Admitting: Oncology

## 2024-03-17 VITALS — BP 125/62 | HR 85 | Temp 99.1°F | Resp 18 | Ht 69.0 in | Wt 173.0 lb

## 2024-03-17 DIAGNOSIS — F17200 Nicotine dependence, unspecified, uncomplicated: Secondary | ICD-10-CM | POA: Insufficient documentation

## 2024-03-17 DIAGNOSIS — Z720 Tobacco use: Secondary | ICD-10-CM

## 2024-03-17 DIAGNOSIS — Z79620 Long term (current) use of immunosuppressive biologic: Secondary | ICD-10-CM | POA: Insufficient documentation

## 2024-03-17 DIAGNOSIS — B1920 Unspecified viral hepatitis C without hepatic coma: Secondary | ICD-10-CM | POA: Diagnosis not present

## 2024-03-17 DIAGNOSIS — Z5112 Encounter for antineoplastic immunotherapy: Secondary | ICD-10-CM | POA: Insufficient documentation

## 2024-03-17 DIAGNOSIS — C220 Liver cell carcinoma: Secondary | ICD-10-CM | POA: Insufficient documentation

## 2024-03-17 NOTE — Progress Notes (Signed)
 START ON PATHWAY REGIMEN - Hepatobiliary     Cycle 1: A cycle is 28 days:     Tremelimumab-actl      Durvalumab    Cycles 2 and beyond: A cycle is every 28 days:     Durvalumab   **Always confirm dose/schedule in your pharmacy ordering system**  Patient Characteristics: Hepatocellular Carcinoma, Unresectable or Nonsurgical Candidate, Locally Advanced or Metastatic Disease Not Amenable to Locoregional Therapy, Systemic Therapy, First Line, No Prior Transplant and Candidate for Immunotherapy Hepatobiliary Disease Type: Hepatocellular Carcinoma Line of Therapy: First Line Intent of Therapy: Non-Curative / Palliative Intent, Discussed with Patient

## 2024-03-19 ENCOUNTER — Encounter: Payer: Self-pay | Admitting: Oncology

## 2024-03-19 ENCOUNTER — Ambulatory Visit
Admission: RE | Admit: 2024-03-19 | Discharge: 2024-03-19 | Disposition: A | Discharge status: Home / Self Care | Attending: Family Medicine | Admitting: Family Medicine

## 2024-03-19 ENCOUNTER — Other Ambulatory Visit: Payer: Self-pay

## 2024-03-19 MED ORDER — RABIES VACCINE, PCEC IM SUSR
1.0000 mL | Freq: Once | INTRAMUSCULAR | Status: AC
  Administered 2024-03-19: 1 mL via INTRAMUSCULAR

## 2024-03-19 NOTE — Assessment & Plan Note (Signed)
 Patient has a history of hepatitis C that is not treated.  - Follow-up with Dr.[name]

## 2024-03-19 NOTE — Assessment & Plan Note (Signed)
 Current use of half a pack per day, reduced from two packs per day. Previous discussions about smoking cessation.  - Encouraged attempts to cut down smoking

## 2024-03-19 NOTE — Assessment & Plan Note (Signed)
 Patient has a history of hepatocellular carcinoma initially diagnosed in 10/22.  Patient is not a candidate for liver transplant secondary to cirrhosis and hepatitis C. S/p TARE and Y90 in 03/14/2022 and 08/24/2022 respectively.   MRI from 11/27/2023 showed progression of disease Patient had chemo embolization on 12/18/2023 and had partial response and new lesions on recent MRI. CT chest with no evidence of metastasis but does have some stable small lesions AFP: 21  - Considering patient has multilobar disease unresponsive to local treatment and I do not believe radiation therapy would be a good option considering the extent of disease, we will start treatment with dual immunotherapy. - Patient has a history of CVA in 2005 and with risk of stroke will do the regimen of tremelimumab  and durvalumab  per [facility] trial which showed an OS benefit of 16.4 months compared to 13.8 months with sorafenib.  Discussed common immunotherapy side effects including thyroid dysfunction, colitis, hepatitis, pneumonitis and rash. - Patient is in agreement with treatment - Will repeat an MRI in 3 months to assess for response.  Will repeat AFP every 3 months.  Return to clinic prior to the second cycle

## 2024-03-19 NOTE — ED Triage Notes (Signed)
Pt here for 4th rabies vaccine.

## 2024-03-19 NOTE — Progress Notes (Signed)
 Patient Care Team: Vick Lurie, FNP as PCP - General (Family Medicine) Cindie Carlin POUR, DO as Consulting Physician (Gastroenterology) Davonna Siad, MD as Medical Oncologist (Medical Oncology) Celestia Joesph SQUIBB, RN as Oncology Nurse Navigator (Medical Oncology)  Clinic Day:  03/19/2024  Referring physician: Vick Lurie, FNP   CHIEF COMPLAINT:  CC: Hepatocellular carcinoma   ASSESSMENT & PLAN:   Assessment & Plan: Danny [name]  is a 58 y.o. male with hepatocellular carcinoma  Hepatocellular carcinoma (HCC) Patient has a history of hepatocellular carcinoma initially diagnosed in 10/22.  Patient is not a candidate for liver transplant secondary to cirrhosis and hepatitis C. S/p TARE and Y90 in 03/14/2022 and 08/24/2022 respectively.   MRI from 11/27/2023 showed progression of disease Patient had chemo embolization on 12/18/2023 and had partial response and new lesions on recent MRI. CT chest with no evidence of metastasis but does have some stable small lesions AFP: 21  - Considering patient has multilobar disease unresponsive to local treatment and I do not believe radiation therapy would be a good option considering the extent of disease, we will start treatment with dual immunotherapy. - Patient has a history of CVA in 2005 and with risk of stroke will do the regimen of tremelimumab  and durvalumab  per [facility] trial which showed an OS benefit of 16.4 months compared to 13.8 months with sorafenib.  Discussed common immunotherapy side effects including thyroid dysfunction, colitis, hepatitis, pneumonitis and rash. - Patient is in agreement with treatment - Will repeat an MRI in 3 months to assess for response.  Will repeat AFP every 3 months.  Return to clinic prior to the second cycle   Hepatitis C Patient has a history of hepatitis C that is not treated.  - Follow-up with Dr.[name]  Tobacco use Current use of half a pack per day, reduced from two packs per  day. Previous discussions about smoking cessation.  - Encouraged attempts to cut down smoking   The patient understands the plans discussed today and is in agreement with them.  He knows to contact our office if he develops concerns prior to his next appointment.  I provided 40 minutes of face-to-face time during this encounter and > 50% was spent counseling as documented under my assessment and plan.    Siad Davonna, MD  [facility] [place] - A DEPT OF [facility] [address] [place] [place] [address] Dept: [phone] Dept Fax: [phone]   Orders Placed This Encounter  Procedures   CBC with Differential    Standing Status:   Future    Expected Date:   03/24/2024    Expiration Date:   03/24/2025   Comprehensive metabolic panel    Standing Status:   Future    Expected Date:   03/24/2024    Expiration Date:   03/24/2025   T4    Standing Status:   Future    Expected Date:   03/24/2024    Expiration Date:   03/24/2025   TSH    Standing Status:   Future    Expected Date:   03/24/2024    Expiration Date:   03/24/2025     ONCOLOGY HISTORY:   Oncology History  Hepatocellular carcinoma (HCC)  06/29/2021 Imaging   MRI liver with and without contrast:  Arterially hyperenhancing mass of the central posterior left lobe of the liver, hepatic segment II/III, measuring 2.7 x 2.1 cm, with some evidence of washout and capsular enhancement. LI-RADS  category 5, consistent with hepatocellular carcinoma. 2. Additional subtle arterially hyperenhancing lesion of the central liver dome, hepatic segment VIII, measuring 1.1 cm, without evidence of washout or capsule. LI-RADS category 3, intermediate suspicion for hepatocellular carcinoma. Attention on follow-up.   06/29/2021 Initial Diagnosis   Hepatocellular carcinoma (HCC)   01/18/2022 Imaging   MRI abdomen with and without contrast:   IMPRESSION: Decreased size and lack of internal  enhancement of treated mass in segment 3 of the left lobe. (LR-TR Nonviable)   Increased size of 15 mm hypervascular lesion in the liver dome. (LI-RADS Category 5: Definitely HCC)   9 mm hypervascular lesion near the junction of the right and left lobes, not seen on prior exam. (LI-RADS Category 3: Intermediate probability of malignancy)   No evidence of abdominal metastatic disease.   Stable splenomegaly and portosystemic collaterals, consistent with portal venous hypertension.   03/14/2022 Procedure   Segment 8 radiation segmentectomy- Right Y90 embolization   07/18/2022 Imaging   MRI abdomen with and without contrast:  1. Low-level, diffuse hyperenhancement of the left lobe of the liver as well as hepatic segment VIII with associated atrophy, but without suspicious focal hyperenhancement. Findings are consistent treated multifocal hepatocellular carcinoma without evidence of residual viable disease. LI-RADS TR, nonviable. 2. No new liver lesions. 3. No evidence of lymphadenopathy or metastatic disease in the abdomen. 4. Splenomegaly and large splenic varices.   08/24/2022 Procedure   Left hepatic TARE   05/23/2023 Imaging   MRI abdomen with and without contrast:  IMPRESSION: 1. Unchanged post treatment atrophy of the left lobe of the liver as well as hepatic segment VIII, with associated post treatment hyperemia following radio embolization. No suspicious associated contrast enhancement. LI-RADS TR, nonviable. 2. Unchanged, somewhat ill-defined arterial hyperenhancement in hepatic segment IVB measuring 1.7 x 1.5 cm, on today's examination with some evidence of heterogeneous internal washout, but without evidence of capsular enhancement. This is technically characterized as LI-RADS category 4, suspicious for an additional focus of hepatocellular carcinoma and warrants close attention on follow-up. 3. Multiple additional unchanged arterially hyperenhancing lesions scattered throughout the  liver measuring up to 1.3 cm, which were new on prior examination dated 11/27/2022 but unchanged in the immediate interval. These may reflect dystrophic or regenerative nodularity or alternately additional foci of hepatocellular carcinoma, LI-RADS category 3, intermediate suspicion for hepatocellular carcinoma. Recommend follow-up in 6 months. 4. Cirrhosis and stigmata portal hypertension including splenomegaly and varices.    05/23/2023 Tumor Marker   AFP: 7.0   11/12/2023 Imaging   MRI abdomen with and without contrast:  IMPRESSION: 1. Redemonstration of multiple liver lesions, as described above. 2. There is a 1.2 x 1.4 cm LI-RADS 5 lesion in the left hepatic lobe, segment 4B, which is slightly decreased in size since the prior study. 3. There are 2, LI-RADS 5 lesions in the right hepatic dome, which are slightly increased in size since the prior study. 4. There are 3, LI-RADS 4 lesions in the right hepatic lobe, segments 6/7, which are grossly stable. 5. There is posttreatment cavity in the right hepatic dome, at the junctions of segment 8 and 4A a measuring approximately 2.1 x 2.2 cm. The cavity exhibits peripheral irregular enhancing wall which exhibits washout on delayed/equilibrium phase images, favoring viable tumor. 6. No new liver lesion seen.   11/12/2023 Cancer Staging   Staging form: Liver, AJCC 8th Edition - Clinical stage from 11/12/2023: Stage IB (rcT1b, cN0, cM0) - Signed by Davonna Siad, MD on 11/21/2023 Stage prefix: Recurrence  01/29/2024 Imaging   CT abdomen and pelvis with contrast:  Suspected multifocal HCC, including: -2.4 cm rim-enhancing lesion at the junction of segments (series 6/image 12), previously 2.2 cm  -1.6 cm enhancing lesion medially in segment 7 (series 6/image 7), previously 1.4 cm  -1.6 cm vague enhancing lesion in segment 4b (series 6/image 29), previously 1.4 cm   Additionally, there is a 2.6 cm irregular enhancing lesion in segment 2 (series 6 /  image 14) obscured on prior MR, favoring an additional lesion.   IMPRESSION: 1. Suspected multifocal HCC, favored to be overall similar to prior MR, noting differences in technique. 2. Cirrhosis with moderate splenomegaly.   02/25/2024 Cancer Staging   Staging form: Liver, AJCC 8th Edition - Pathologic stage from 02/25/2024: ycT2(m), ycN0, cM0 - Signed by Davonna Siad, MD on 02/25/2024 Stage prefix: Post-therapy Response to neoadjuvant therapy: Partial response Bilateral cancer: Yes Multiple tumors: Yes   03/10/2024 Imaging   CT chest with contrast:  IMPRESSION: No significant interval change.   Few scattered tiny lung nodules identified under 5 mm are similar to previous. No new dominant lung nodule.   03/24/2024 -  Chemotherapy   Patient is on Treatment Plan : Hepatocellular Carcinoma Tremelimumab -actl C1 D1 + Durvalumab  q28d          Current Treatment: Tremelimumab  plus durvalumab   INTERVAL HISTORY:  DANA DORNER is here today for follow up. Patient is accompanied by his niece today. Since the last visit, patient was attacked by dogs and was in the ER for it.  He received tetanus shots for it.  He has recovered from all the bites.  Today, patient has no complaints and has been doing really well.  We discussed the CT chest findings and that it did not show any evidence of metastasis.  We also discussed in detail starting dual immunotherapy for unresectable HCC, we went over most common side effects and discussed the overall survival benefit with the regimen.  Patient is in agreement to go further with the treatment.  I have reviewed the past medical history, past surgical history, social history and family history with the patient and they are unchanged from previous note.  ALLERGIES:  is allergic to trazodone, benadryl  [diphenhydramine ], and ultram  [tramadol ].  MEDICATIONS:  Current Outpatient Medications  Medication Sig Dispense Refill   albuterol  (VENTOLIN  HFA) 108  (90 Base) MCG/ACT inhaler Inhale 2 puffs into the lungs every 6 (six) hours as needed for wheezing or shortness of breath.     ALPRAZolam (XANAX) 0.25 MG tablet Take 0.25 mg by mouth at bedtime.     Cholecalciferol  (VITAMIN D -3) 125 MCG (5000 UT) TABS Take 5,000 Units by mouth daily.     citalopram  (CELEXA ) 20 MG tablet Take 20 mg by mouth daily.     cyclobenzaprine  (FLEXERIL ) 5 MG tablet Take 5 mg by mouth daily as needed for muscle spasms.     gabapentin  (NEURONTIN ) 600 MG tablet Take 1 tablet (600 mg total) by mouth in the morning and at bedtime. 180 tablet 1   Melatonin 10 MG CAPS Take 10 mg by mouth at bedtime.     metFORMIN  (GLUCOPHAGE ) 1000 MG tablet Take 1,000 mg by mouth 2 (two) times daily.     mirtazapine (REMERON) 7.5 MG tablet Take 1 tablet every day by oral route for 90 days.     naloxone (NARCAN) nasal spray 4 mg/0.1 mL Place 1 spray into the nose once.     oxyCODONE -acetaminophen  (PERCOCET) 10-325 MG tablet Take 1  tablet by mouth every 4 (four) hours as needed for pain. 15 tablet 0   oxyCODONE -acetaminophen  (PERCOCET/ROXICET) 5-325 MG tablet Take 1 tablet by mouth every 6 (six) hours as needed for severe pain (pain score 7-10). 6 tablet 0   pantoprazole  (PROTONIX ) 40 MG tablet Take 1 tablet (40 mg total) by mouth daily. 30 tablet 2   QUEtiapine  (SEROQUEL ) 100 MG tablet Take 100 mg by mouth at bedtime.     rosuvastatin  (CRESTOR ) 10 MG tablet Take 1 tablet (10 mg total) by mouth daily. 30 tablet 11   Sofosbuvir-Velpatasvir 400-100 MG TABS Take 1 tablet by mouth daily.     No current facility-administered medications for this visit.    REVIEW OF SYSTEMS:   Constitutional: Denies fevers, chills or abnormal weight loss Eyes: Denies blurriness of vision Ears, nose, mouth, throat, and face: Denies mucositis or sore throat Respiratory: Denies cough, dyspnea or wheezes Cardiovascular: Denies palpitation, chest discomfort or lower extremity swelling Gastrointestinal:  Denies nausea,  heartburn or change in bowel habits Skin: Denies abnormal skin rashes Lymphatics: Denies new lymphadenopathy or easy bruising Neurological:Denies numbness, tingling or new weaknesses Behavioral/Psych: Mood is stable, no new changes  All other systems were reviewed with the patient and are negative.   VITALS:  Blood pressure 125/62, pulse 85, temperature 99.1 F (37.3 C), temperature source Tympanic, resp. rate 18, height 5' 9 (1.753 m), weight 173 lb (78.5 kg), SpO2 98%.  Wt Readings from Last 3 Encounters:  03/17/24 173 lb (78.5 kg)  03/04/24 168 lb (76.2 kg)  02/25/24 164 lb 14.5 oz (74.8 kg)    Body mass index is 25.55 kg/m.  Performance status (ECOG): 2 - Symptomatic, <50% confined to bed  PHYSICAL EXAM:   GENERAL:alert, no distress and comfortable SKIN: Multiple bite wounds on bilateral legs that are at various stages of healing  LUNGS: clear to auscultation and percussion with normal breathing effort HEART: regular rate & rhythm and no murmurs and no lower extremity edema ABDOMEN:abdomen soft, non-tender and normal bowel sounds Musculoskeletal:no cyanosis of digits and no clubbing  NEURO: alert & oriented x 3 with fluent speech  LABORATORY DATA:  I have reviewed the data as listed    Component Value Date/Time   NA 136 02/25/2024 0943   K 3.8 02/25/2024 0943   CL 104 02/25/2024 0943   CO2 22 02/25/2024 0943   GLUCOSE 202 (H) 02/25/2024 0943   BUN 9 02/25/2024 0943   CREATININE 0.91 02/25/2024 0943   CREATININE 0.83 06/11/2021 0907   CALCIUM  9.7 02/25/2024 0943   PROT 7.7 02/25/2024 0943   ALBUMIN  3.6 02/25/2024 0943   AST 30 02/25/2024 0943   ALT 21 02/25/2024 0943   ALKPHOS 138 (H) 02/25/2024 0943   BILITOT 0.9 02/25/2024 0943   GFRNONAA >60 02/25/2024 0943   GFRAA >60 05/24/2019 1858   Lab Results  Component Value Date   WBC 5.4 02/25/2024   NEUTROABS 3.5 02/25/2024   HGB 15.8 02/25/2024   HCT 45.0 02/25/2024   MCV 91.1 02/25/2024   PLT 112 (L)  02/25/2024      Chemistry      Component Value Date/Time   NA 136 02/25/2024 0943   K 3.8 02/25/2024 0943   CL 104 02/25/2024 0943   CO2 22 02/25/2024 0943   BUN 9 02/25/2024 0943   CREATININE 0.91 02/25/2024 0943   CREATININE 0.83 06/11/2021 0907      Component Value Date/Time   CALCIUM  9.7 02/25/2024 0943   ALKPHOS 138 (H)  02/25/2024 0943   AST 30 02/25/2024 0943   ALT 21 02/25/2024 0943   BILITOT 0.9 02/25/2024 0943      Latest Reference Range & Units 02/25/24 09:43  AFP, Serum, Tumor Marker 0.0 - 8.4 ng/mL 21.0 (H)  (H): Data is abnormally high  RADIOGRAPHIC STUDIES: I have personally reviewed the radiological images as listed and agreed with the findings in the report.  CT Chest W Contrast Result Date: 03/13/2024 CLINICAL DATA:  Hepatocellular carcinoma monitoring. * Tracking Code: BO * EXAM: CT CHEST WITH CONTRAST TECHNIQUE: Multidetector CT imaging of the chest was performed during intravenous contrast administration. RADIATION DOSE REDUCTION: This exam was performed according to the departmental dose-optimization program which includes automated exposure control, adjustment of the mA and/or kV according to patient size and/or use of iterative reconstruction technique. CONTRAST:  80mL OMNIPAQUE  IOHEXOL  300 MG/ML  SOLN COMPARISON:  Chest CT 09/24/2021.  Abdomen pelvis CT 01/29/2024 FINDINGS: Cardiovascular: Heart is nonenlarged. No pericardial effusion. Coronary artery calcifications are seen. The thoracic aorta has a normal course and caliber. There is some scattered atherosclerotic plaque identified. This includes along the great vessels. There is also a normal variant of an early origin of the left vertebral artery. Please correlate for other coronary risk factors. Mediastinum/Nodes: Normal caliber thoracic esophagus. Preserved thyroid gland. No specific abnormal lymph node enlargement identified in the axillary region hila. There is some small nodes in the mediastinum and left  thoracic inlet which are unchanged from previous and not pathologic by size criteria. Lungs/Pleura: Breathing motion seen throughout the examination. Mild apical subpleural blebs and slight emphysematous change. No consolidation, pneumothorax or effusion. Previous 3 mm nodule is stable in the right lower lobe on image 96 similar right apical nodule stable on image 35. Few other scattered tiny nodules identified as well including right upper lobe image 48, 50, 67. No new dominant lung nodule. Upper Abdomen: Again changes of known chronic liver disease with nodular fatty liver, splenomegaly and significant varices including what may be a large splenorenal shunt. There appears to be a heterogeneous mass peripherally in the dome of the liver in segment 4/8. Please correlate for history of known HCC. Additional delusions are not as well identified on this chest examination. Musculoskeletal: Scattered degenerative changes along the spine. IMPRESSION: No significant interval change. Few scattered tiny lung nodules identified under 5 mm are similar to previous. No new dominant lung nodule. Coronary artery calcifications. Please correlate for other coronary risk factors. Please correlate with known history of chronic liver disease and HCC. Heterogeneous liver with splenomegaly and varices. Possible large splenorenal shunt. Liver lesions are not well identified on today's examination of the chest. Please correlate with previous and any dedicated liver imaging. Aortic Atherosclerosis (ICD10-I70.0) and Emphysema (ICD10-J43.9). Electronically Signed   By: Ranell Bring M.D.   On: 03/13/2024 11:05

## 2024-03-22 ENCOUNTER — Encounter: Admitting: Dietician

## 2024-03-22 ENCOUNTER — Inpatient Hospital Stay

## 2024-03-22 ENCOUNTER — Telehealth: Payer: Self-pay | Admitting: Dietician

## 2024-03-22 DIAGNOSIS — C220 Liver cell carcinoma: Secondary | ICD-10-CM

## 2024-03-22 MED ORDER — ONDANSETRON HCL 8 MG PO TABS: 8.0000 mg | ORAL_TABLET | Freq: Three times a day (TID) | ORAL | 1 refills | Status: DC | PRN

## 2024-03-22 MED ORDER — PROCHLORPERAZINE MALEATE 10 MG PO TABS: 10.0000 mg | ORAL_TABLET | Freq: Four times a day (QID) | ORAL | 1 refills | Status: AC | PRN

## 2024-03-22 MED ORDER — LIDOCAINE-PRILOCAINE 2.5-2.5 % EX CREA: TOPICAL_CREAM | CUTANEOUS | 3 refills | Status: DC

## 2024-03-22 NOTE — Patient Instructions (Signed)
 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  You will receive the medications Imjudo  and Imfinzi .  These are a type of immunotherapies that help your immune system fight the cancer cells.  The intent of treatment is Palliative meaning to keep the cancer under control and manage your symptoms.    You will see the doctor regularly throughout treatment.  We will obtain blood work from you prior to every treatment and monitor your results to make sure it is safe to give your treatment. The doctor monitors your response to treatment by the way you are feeling, your blood work, and by obtaining scans periodically.  There will be wait times while you are here for treatment.  It will take about 30 minutes to 1 hour for your lab work to result.  Then there will be wait times while pharmacy mixes your medications. [facility]    Durvalumab  (Imfinzi )    About This Drug  Durvalumab  is used to treat cancer. This drug is given in the vein (IV). It will take 1 hour to infuse.    Possible Side Effects   Nausea   Tiredness   Weakness   Inflammation (swelling) of the lungs. You may have a dry cough or trouble breathing.   Cough   Trouble breathing   Upper respiratory tract infection   Rash   Hair loss. Hair loss is often temporary, although with certain medicine, hair loss can sometimes be permanent. Hair loss may happen suddenly or gradually. If you lose hair, you may lose it from your head, face, armpits, pubic area, chest, and/or legs. You may also notice your hair getting thin.   Note: Each of the side effects above was reported in 20% or greater of patients treated with durvalumab . Your side effects may be different depending on your specific condition. Not all possible side effects are included above.    Warnings and Precautions   This drug works with your immune system and can cause inflammation (swelling) in  any of your organs and tissues and can change how they work. This may put you at risk for developing serious medical problems, which can be life-threatening. These side effects may require treatment with steroids at the discretion of your doctor.    Severe inflammation of the lungs, which can be life-threatening. You may have a dry cough or trouble breathing.    Colitis, which is swelling (inflammation) in the colon. The symptoms are diarrhea (loose bowel movements), stomach cramping, and sometimes blood in the bowel movements.    Changes in your central nervous system can happen. The central nervous system is made up of your brain and spinal cord. You could feel extreme tiredness, agitation, confusion, hallucinations (see or hear things that are not there), trouble understanding or speaking, loss of control of your bowels or bladder, eyesight changes, numbness, or lack of strength to your arms, legs, face, or body, and coma. If you start to have any of these symptoms let your doctor know right away.    Severe changes in your liver function, which can cause liver failure and be life-threatening.    This drug may affect some of your hormone glands (especially the thyroid, adrenals, pituitary, and pancreas).    Blood sugar levels may change, and you may develop diabetes. If you already have diabetes, changes may need to be made to your diabetes medication.    Changes in your kidney function  Allergic skin reaction, which can be life-threatening. You may develop blisters on your skin that are filled with fluid or a severe red rash all over your body that may be painful.    While you are getting this drug in your vein (IV), you may have a reaction to the drug which can be life-threatening. Sometimes you may be given medication to stop or lessen these side effects. Your nurse will check you closely for these signs: fever or shaking chills, flushing, facial swelling, feeling dizzy, headache, trouble  breathing, rash, itching, chest tightness, or chest pain. If this happens, call 911 for emergency care.    Increased risk of serious complications which can be life-threatening such as graft versus host disease (GVHD) in patients who undergo a stem cell transplant before or after receiving durvalumab .    Increased risk of organ rejection in patients who have received donor organs. Note: Some of the side effects above are very rare. If you have concerns and/or questions, please discuss them with your medical team. Important Information    This drug may be present in the saliva, tears, sweat, urine, stool, vomit, semen, and vaginal secretions. Talk to your doctor and/or your nurse about the necessary precautions to take during this time.   Treating Side Effects   Manage tiredness by pacing your activities for the day.    Be sure to include periods of rest between energy-draining activities.    To help decrease the risk of bleeding, use a soft toothbrush. Check with your nurse before using dental floss.  Be very careful when using knives or tools.    Use an electric shaver instead of a razor.    Drink plenty of fluids (a minimum of eight glasses per day is recommended).    To help with nausea, eat small, frequent meals instead of three large meals a day. Choose foods and drinks that are at room temperature.    If you have diarrhea, eat low-fiber foods that are high in protein and calories, and avoid foods that can irritate your digestive tracts or lead to cramping. You should also drink more fluids so that you do not become dehydrated (lack of water in the body from losing too much fluid).    Ask your doctor or nurse about medicine that is available to help stop or lessen diarrhea, and/or nausea.    If you have diabetes, keep good control of your blood sugar level. Tell your nurse or your doctor if your glucose levels are higher or lower than normal.    Keeping your pain under control is  important to your well-being. Please tell your doctor or nurse if you are experiencing pain.    If you get a rash do not put anything on it unless your doctor or nurse says you may. Keep the area around the rash clean and dry. Ask your doctor for medicine if your rash bothers you.    To help with hair loss, wash with a mild shampoo and avoid washing your hair every day. Avoid coloring your hair.    Avoid rubbing your scalp, pat your hair or scalp dry.    Limit your use of hair spray, electric curlers, blow dryers, and curling irons.    If you are interested in getting a wig, talk to your nurse and they can help you get in touch with programs in your local area.    If you have numbness and tingling in your hands and feet, be careful when  cooking, walking, and handling sharp objects and hot liquids.    Infusion reactions may happen after your infusion. If this happens, call 911 for emergency care.   Food and Drug Interactions   There are no known interactions of durvalumab  with food.    This drug may interact with other medicines. Tell your doctor and pharmacist about all the prescription and over-the-counter medicines and dietary supplements (vitamins, minerals, herbs, and others) that you are taking at this time. Also, check with your doctor or pharmacist before starting any new prescription or over-the-counter medicines, or dietary supplements to make sure that there are no interactions.   When to Call the Doctor  Not all possible side effects are included. Some of these side effects, although rare, can be lifethreatening.   Lung problems:   Inflammation of the lungs   Cough   Trouble breathing   Upper respiratory tract infection Call your doctor or nurse if you have any of these symptoms:   Wheezing and/or trouble breathing   New or worsening cough   Coughing up yellow, green, or bloody mucus   Chest pain   Stomach problems:   Decreased appetite (decreased hunger)   Nausea and  vomiting (throwing up)   Diarrhea (loose bowel movements)   Constipation (unable to move bowels)   Pain in your abdomen   Inflammation of your colon   Blood in your stool   Call your doctor or nurse if you have any of these symptoms:   Nausea that stops you from eating or drinking or is not relieved by prescribed medicine    Throwing up more than 3 times a day    Lasting loss of appetite or rapid weight loss of five pounds in a week    Diarrhea, 4 times in one day or diarrhea with lack of strength or a feeling of being dizzy    No bowel movement for 3 days or when you feel uncomfortable    Pain in your abdomen that does not go away    Blood in your stool (bright red, or black/tarry)   Liver problems:   Changes in your liver function   Call your doctor or nurse if you have any of these symptoms:   Yellowing of the eyes or skin    Dark urine    Pale bowel movements    Pain on the right side of your abdomen that does not go away    Feeling very tired and weak    Unusual itching     Easy bleeding or bruising   Hormone gland problems:   Changes in some of your hormone glands (especially the thyroid, adrenals, pituitary and pancreas)   Blood sugar levels may change, and you may develop diabetes   Call your doctor or nurse if you have any of these symptoms:   Headache that does not go away    Tiredness that interferes with your daily activities    Feeling dizzy or lightheaded    Changes in mood or behavior such as irritability and/or feeling forgetful    Shakiness    Weight loss or weight gain    Nausea    Abnormal blood sugar    Unusual thirst or passing urine often    Feeling cold   Kidney problems:   Changes in your kidney function   Urinary tract infection   Call your doctor or nurse if you have any of these symptoms:   Decreased or very dark urine  Cloudy urine and/or urine that smells bad    Difficulty urinating    Pain or burning when you  pass urine    Feeling like you have to pass urine often, but not much comes out when you do    Tender or heavy feeling in your lower abdomen    Pain on one side of your back under your ribs   Skin problems:   Rash and itching   Soreness of the mouth and throat   Allergic skin reaction   Call your doctor or nurse if you have any of these symptoms:   New rash and/or itching    Fluid-filled bumps/blisters    Rash that is not relieved by prescribed medicines    Red areas, white patches, or sores in your mouth that hurt   Inflammation of the brain:   Changes in your brain and spinal cord   Headache   Effects on the nerves   Call your doctor or nurse if you have any of these symptoms:   Headache that does not go away    Extreme tiredness, agitation, or confusion    Seizures    Hallucinations    Trouble understanding or speaking    Loss of control of bowels or bladder    Numbness or lack of strength to your arms, legs, face, or body    Numbness, tingling, pins, and needles, or pain in your arms, hands, legs, or feet   Other problems:   Low red blood cells, and platelets   Fever   Inflammation of your eye and/or other changes in vision   Allergic reaction to the drug   Heart problems   Electrolyte changes   Muscle, bone, and joint pain   Call your doctor or nurse if you have any of these symptoms:   Fever of 100.4 F (38 C) or higher    Chills, flushing    Easy bleeding or bruising    Blurred vision or other changes in eyesight    Sensitivity to light    Feeling that your heart is beating fast or in a not normal way (palpitations)    Signs of infusion reaction: fever or shaking chills, flushing, facial swelling, feeling dizzy, headache, trouble breathing, rash, itching, chest tightness, or chest pain. If this happens, call 911 for emergency care.    Pain that does not go away, or is not relieved by prescribed medicines    Extreme muscle weakness    Reproduction Warnings   Pregnancy warning: This drug can have harmful effects on the unborn baby. Women of childbearing potential should use effective methods of birth control during your cancer treatment and for at least 3 months after stopping treatment. Let your doctor know right away if you think you may be pregnant.    Breastfeeding warning: Women should not breastfeed during treatment and for at least 3 months after stopping treatment because this drug could enter the breast milk and cause harm to a breastfeeding baby.    Fertility warning: Fertility studies have not been done with this drug. Talk with your doctor or nurse if you plan to have children. Ask for information on sperm or egg banking.    Tremelimumab -actl (Imjudo )  About This Drug Tremelimumab -actl is used to treat cancer. It is given in the vein (IV). It will take 1 hour to infuse.   Possible Side Effects  Decrease in the number of white blood cells, and red blood cells. This may raise your risk of  infection and make you tired and weak.   Diarrhea (loose bowel movements)   Pain in your abdomen   Tiredness   Bone and muscle pain   Changes in your liver function   Decreased level of sodium in your blood   Rash   Itching  Note: Each of the side effects above was reported in 20% or greater of patients treated with tremelimumab -actl. Not all possible side effects are included above.  Warnings and Precautions  This drug works with your immune system and can cause inflammation (swelling) in any of your organs and tissues and can change how they work. This may put you at risk for developing serious medical problems, which can be life-threatening. These side effects may require treatment with steroids at the discretion of your doctor.   Severe inflammation of the lungs, which can be life-threatening. You may have a dry cough or trouble breathing.   Colitis which is swelling (inflammation) in the colon. The  symptoms are diarrhea (loose bowel movements), stomach cramping, and sometimes blood in the bowel movements.   Changes in your central nervous system can happen. The central nervous system is made up of your brain and spinal cord. You could feel extreme tiredness, agitation, confusion, hallucinations (see or hear things that are not there), trouble understanding or speaking, loss of control of your bowels or bladder, eyesight changes, numbness, or lack of strength to your arms, legs, face, or body, and coma. If you start to have any of these symptoms let your doctor know right away.   Severe changes in your liver function which can cause liver failure and be life-threatening.   This drug may affect your hormone glands (especially the thyroid, adrenals, and pituitary).   Blood sugar levels may change, and you may develop diabetes. If you already have diabetes, changes may need to be made to your diabetes medication.   Changes in your kidney function   Allergic skin reaction, which can be life-threatening. You may develop blisters on your skin that are filled with fluid or a severe red rash all over your body that may be painful.   Inflammation of your pancreas   While you are getting this drug in your vein (IV), you may have a reaction to the drug. Sometimes you may be given medication to stop or lessen these side effects. Your nurse will check you closely for these signs: fever or shaking chills, flushing, facial swelling, feeling dizzy, headache, trouble breathing, rash, itching, chest tightness, or chest pain. These reactions may happen after your infusion. If this happens, call 911 for emergency care.  Note: Some of the side effects above are very rare. If you have concerns and/or questions, please discuss them with your medical team.  Important Information  This drug may be present in the saliva, tears, sweat, urine, stool, vomit, semen, and vaginal secretions. Talk to your doctor and/or  your nurse about the necessary precautions to take during this time.  Treating Side Effects  Manage tiredness by pacing your activities for the day.  Be sure to include periods of rest between energy-draining activities.  Get regular exercise, with your doctor's approval. If you feel too tired to exercise vigorously, try taking a short walk.  To decrease the risk of infection, wash your hands regularly.  Avoid close contact with people who have a cold, the flu, or other infections.  Take your temperature as your doctor or nurse tells you, and whenever you feel like you may have a  fever.  Drink plenty of fluids (a minimum of eight glasses per day is recommended).  If you throw up or have diarrhea, you should drink more fluids so that you do not become dehydrated (lack of water in the body from losing too much fluid).  If you have diarrhea, eat low-fiber foods that are high in protein and calories and avoid foods that can irritate your digestive tracts or lead to cramping.  Ask your nurse or doctor about medicine that can lessen or stop diarrhea.  Keeping your pain under control is important to your well-being. Please tell your doctor or nurse if you are experiencing pain.  If you get a rash do not put anything on it unless your doctor or nurse says you may. Keep the area around the rash clean and dry. Ask your doctor for medicine if your rash bothers you.  To help with itching, moisturize your skin several times a day.  Avoid sun exposure and apply sunscreen routinely when outdoors.  Food and Drug Interactions  There are no known interactions of tremelimumab -actl with food.   This drug may interact with other medicines. Tell your doctor and pharmacist about all the prescription and over-the-counter medicines and dietary supplements (vitamins, minerals, herbs, and others) that you are taking at this time. Also, check with your doctor or pharmacist before starting any new prescription or  over-the-counter medicines, or dietary supplements to make sure that there are no interactions.  When to Call the Doctor Not all possible side effects are included. Some of these side effects, although rare, can be lifethreatening.  Lung problems:  Inflammation of the lungs  Cough  Trouble breathing  Upper respiratory tract infection Call your doctor or nurse if you have any of these symptoms:  Wheezing or trouble breathing  New or worsening cough  Coughing up yellow, green, or bloody mucus  Chest pain  Stomach problems:  Decreased appetite (decreased hunger)  Nausea and vomiting (throwing up)  Diarrhea (loose bowel movements)  Constipation (unable to move bowels)  Pain in your abdomen  Inflammation of your colon  Inflammation of your pancreas  Blood in your stool (bright red, or black/tarry)  Call your doctor or nurse if you have any of these symptoms:  Nausea that stops you from eating or drinking or is not relieved by prescribed medicine  Throwing up more than 3 times a day  Lasting loss of appetite or rapid weight loss of five pounds in a week  Diarrhea, 4 times in one day or diarrhea with lack of strength or a feeling of being dizzy  No bowel movement for 3 days or when you feel uncomfortable  Pain in your abdomen that may spread to your back  Blood in your stool (bright red or black/tarry)  Liver problems:  Changes in your liver function Call your doctor or nurse if you have any of these symptoms:  Yellowing of the eyes or skin  Dark urine  Pale bowel movements  Pain on the right side of your abdomen that does not go away  Feeling very tired and weak  Unusual itching  Easy bleeding or bruising  Hormone gland problems:  Changes in some of your hormone glands (especially the thyroid, adrenals, pituitary and pancreas)  Blood sugar levels may change, and you may develop diabetes  Call your doctor or nurse if you have any of these symptoms:  Headache that does not  go away  Tiredness that interferes with your daily activities  Trouble  falling or staying asleep  Feeling dizzy or lightheaded  Changes in mood or behavior such as irritability and/or feeling forgetful  Shakiness  Weight loss or weight gain  Nausea  Abnormal blood sugar  Unusual thirst or passing urine often  Feeling cold  Kidney problems:  Changes in your kidney function  Urinary tract infection Call your doctor or nurse if you have any of these symptoms:  Decreased urine, or very dark urine  Cloudy urine and/or urine that smells bad  Difficulty urinating  Pain or burning when you pass urine  Feeling like you have to pass urine often, but not much comes out when you do  Tender or heavy feeling in your lower abdomen  Pain on one side of your back under your ribs  Skin problems:  Rash and itching  Soreness of the mouth and throat  Allergic skin reaction  Call your doctor or nurse if you have any of these symptoms:  New rash and/or itching  Fluid-filled bumps/blisters  Rash that is not relieved by prescribed medicines  Red areas, white patches, or sores in your mouth that hurt  Flu-like symptoms: fever, headache, muscle and joint aches, and fatigue (low energy, feeling weak)  Inflammation of the brain:  Changes in your brain and spinal cord  Headache  Effects on the nerves  Call your doctor or nurse if you have any of these symptoms:  Headache that does not go away  Extreme tiredness, agitation, or confusion  Seizures  Hallucinations  Trouble understanding or speaking  Loss of control of bowels or bladder  Numbness or lack of strength to your arms, legs, face, or body  Numbness, tingling, pins and needles, or pain in your arms, hands, legs, or feet  Other problems:  Low red blood cells, and platelets  Fever  Inflammation of your eye and/or other changes in vision  Allergic reaction to the drug  Heart problems  Electrolyte changes  Muscle, bone, and joint  pain  Call your doctor or nurse if you have any of these symptoms:  Fever of 100.4 F (38 C) or higher   Chills, flushing   Easy bleeding or bruising   Blurred vision or other changes in eyesight   Sensitivity to light   Feeling that your heart is beating fast or in a not normal way (palpitations)   Signs of infusion reaction: fever or shaking chills, flushing, facial swelling, feeling dizzy, headache, trouble breathing, rash, itching, chest tightness, or chest pain. If this happens, call 911 for emergency care.   Pain that does not go away, or is not relieved by prescribed medicines   Extreme muscle weakness that interferes with normal activities  Reproduction Warnings   Pregnancy warning: This drug may have harmful effects on the unborn baby. Women of childbearing potential should use effective methods of birth control during your cancer treatment and for 3 months after stopping treatment. Let your doctor know right away if you think you may be pregnant. You should also refer to the prescribing information of each chemotherapy drug you are also receiving for more specific information.   Breastfeeding warning: Women should not breastfeed during treatment and for 3 months after stopping treatment because this drug could enter the breast milk and cause harm to a breastfeeding baby. You should also refer to the prescribing information of each chemotherapy drug you are also receiving for more specific information.   Fertility warning: Fertility studies have not been done with this drug. Talk with your  doctor or nurse if you plan to have children. Ask for information on sperm or egg banking. You should also refer to the prescribing information of each chemotherapy drug you are also receiving for more specific information.  SELF CARE ACTIVITIES WHILE ON CHEMOTHERAPY/IMMUNOTHERAPY:  Hydration Increase your fluid intake and drink at least 64 ounces (2 liters) of water/decaffeinated  beverages per day after treatment. You can still have your cup of coffee or soda but these beverages do not count as part of the 64 ounces that you need to drink daily. Limit alcohol intake.  Medications Continue taking your normal prescription medication as prescribed.  If you start any new herbal or new supplements please let us  know first to make sure it is safe.  Mouth Care Have teeth cleaned professionally before starting treatment. Keep dentures and partial plates clean. Use soft toothbrush and do not use mouthwashes that contain alcohol. Biotene is a good mouthwash that is available at most pharmacies or may be ordered by calling [phone]. Use warm salt water gargles (1 teaspoon salt per 1 quart warm water) before and after meals and at bedtime. If you are still having problems with your mouth or sores in your mouth please call the clinic. If you need dental work, please let the doctor know before you go for your appointment so that we can coordinate the best possible time for you in regards to your chemo regimen. You need to also let your dentist know that you are actively taking chemo. We may need to do labs prior to your dental appointment.  Skin Care Always use sunscreen that has not expired and with SPF (Sun Protection Factor) of 50 or higher. Wear hats to protect your head from the sun. Remember to use sunscreen on your hands, ears, face, & feet.  Use good moisturizing lotions such as udder cream, eucerin, or even Vaseline. Some chemotherapies can cause dry skin, color changes in your skin and nails.    Avoid long, hot showers or baths. Use gentle, fragrance-free soaps and laundry detergent. Use moisturizers, preferably creams or ointments rather than lotions because the thicker consistency is better at preventing skin dehydration. Apply the cream or ointment within 15 minutes of showering. Reapply moisturizer at night, and moisturize your hands every time after you wash  them.   Infection Prevention Please wash your hands for at least 30 seconds using warm soapy water. Handwashing is the #1 way to prevent the spread of germs. Stay away from sick people or people who are getting over a cold. If you develop respiratory systems such as green/yellow mucus production or productive cough or persistent cough let us  know and we will see if you need an antibiotic. It is a good idea to keep a pair of gloves on when going into grocery stores/[employer] to decrease your risk of coming into contact with germs on the carts, etc. Carry alcohol hand gel with you at all times and use it frequently if out in public. If your temperature reaches 100.5 or higher please call the clinic and let us  know.  If it is after hours or on the weekend please go to the ER if your temperature is over 100.4.  Please have your own personal thermometer at home to use.    Sex and bodily fluids If you are going to have sex, a condom must be used to protect the person that isn't taking immunotherapy. For a few days after treatment, immunotherapy can be excreted through your bodily  fluids.  When using the toilet please close the lid and flush the toilet twice.  Do this for a few day after you have had immunotherapy.   Contraception It is not known for sure whether or not immunotherapy drugs can be passed on through semen or secretions from the vagina. Because of this some doctors advise people to use a barrier method if you have sex during treatment. This applies to vaginal, anal or oral sex.  Generally, doctors advise a barrier method only for the time you are actually having the treatment and for about a week after your treatment.  Advice like this can be worrying, but this does not mean that you have to avoid being intimate with your partner. You can still have close contact with your partner and continue to enjoy sex.  Animals If you have cats or birds we ask that you not change the litter or change the  cage.  Please have someone else do this for you while you are on immunotherapy.   Food Safety During and After Cancer Treatment Food safety is important for people both during and after cancer treatment. Cancer and cancer treatments, such as chemotherapy, radiation therapy, and stem cell/bone marrow transplantation, often weaken the immune system. This makes it harder for your body to protect itself from foodborne illness, also called food poisoning. Foodborne illness is caused by eating food that contains harmful bacteria, parasites, or viruses.  Foods to avoid Some foods have a higher risk of becoming tainted with bacteria. These include: Unwashed fresh fruit and vegetables, especially leafy vegetables that can hide dirt and other contaminants Raw sprouts, such as alfalfa sprouts Raw or undercooked beef, especially ground beef, or other raw or undercooked meat and poultry Fatty, fried, or spicy foods immediately before or after treatment.  These can sit heavy on your stomach and make you feel nauseous. Raw or undercooked shellfish, such as oysters. Sushi and sashimi, which often contain raw fish.  Unpasteurized beverages, such as unpasteurized fruit juices, raw milk, raw yogurt, or cider Undercooked eggs, such as soft boiled, over easy, and poached; raw, unpasteurized eggs; or foods made with raw egg, such as homemade raw cookie dough and homemade mayonnaise  Simple steps for food safety  Shop smart. Do not buy food stored or displayed in an unclean area. Do not buy bruised or damaged fruits or vegetables. Do not buy cans that have cracks, dents, or bulges. Pick up foods that can spoil at the end of your shopping trip and store them in a cooler on the way home.  Prepare and clean up foods carefully. Rinse all fresh fruits and vegetables under running water, and dry them with a clean towel or paper towel. Clean the top of cans before opening them. After preparing food, wash your hands for  20 seconds with hot water and soap. Pay special attention to areas between fingers and under nails. Clean your utensils and dishes with hot water and soap. Disinfect your kitchen and cutting boards using 1 teaspoon of liquid, unscented bleach mixed into 1 quart of water.    Dispose of old food. Eat canned and packaged food before its expiration date (the "use by" or "best before" date). Consume refrigerated leftovers within 3 to 4 days. After that time, throw out the food. Even if the food does not smell or look spoiled, it still may be unsafe. Some bacteria, such as Listeria, can grow even on foods stored in the refrigerator if they are kept for  too long.  Take precautions when eating out. At restaurants, avoid buffets and salad bars where food sits out for a long time and comes in contact with many people. Food can become contaminated when someone with a virus, often a norovirus, or another "bug" handles it. Put any leftover food in a "to-go" container yourself, rather than having the server do it. And, refrigerate leftovers as soon as you get home. Choose restaurants that are clean and that are willing to prepare your food as you order it cooked.    SYMPTOMS TO REPORT AS SOON AS POSSIBLE AFTER TREATMENT:  FEVER GREATER THAN 100.4 F CHILLS WITH OR WITHOUT FEVER NAUSEA AND VOMITING THAT IS NOT CONTROLLED WITH YOUR NAUSEA MEDICATION UNUSUAL SHORTNESS OF BREATH UNUSUAL BRUISING OR BLEEDING TENDERNESS IN MOUTH AND THROAT WITH OR WITHOUT PRESENCE OF ULCERS URINARY PROBLEMS BOWEL PROBLEMS UNUSUAL RASH     Wear comfortable clothing and clothing appropriate for easy access to any Portacath or PICC line. Let us  know if there is anything that we can do to make your therapy better!   What to do if you need assistance after hours or on the weekends: CALL [phone].  HOLD on the line, do not hang up.  You will hear multiple messages but at the end you will be connected with a nurse triage  line.  They will contact the doctor if necessary.  Most of the time they will be able to assist you.  Do not call the hospital operator.    I have been informed and understand all of the instructions given to me and have received a copy. I have been instructed to call the clinic [phone] or my family physician as soon as possible for continued medical care, if indicated. I do not have any more questions at this time but understand that I may call the Cancer Center or the Patient Navigator at [phone] during office hours should I have questions or need assistance in obtaining follow-up care.

## 2024-03-22 NOTE — Telephone Encounter (Signed)
 Attempted to reach patient via telephone for nutrition assessment. No answer. Left VM with request for return call. Contact information provided.

## 2024-03-22 NOTE — Progress Notes (Signed)

## 2024-03-23 NOTE — Progress Notes (Signed)
 Pharmacist Chemotherapy Monitoring - Initial Assessment    Anticipated start date: 03/24/24   The following has been reviewed per standard work regarding the patient's treatment regimen: The patient's diagnosis, treatment plan and drug doses, and organ/hematologic function Lab orders and baseline tests specific to treatment regimen  The treatment plan start date, drug sequencing, and pre-medications Prior authorization status  Patient's documented medication list, including drug-drug interaction screen and prescriptions for anti-emetics and supportive care specific to the treatment regimen The drug concentrations, fluid compatibility, administration routes, and timing of the medications to be used The patient's access for treatment and lifetime cumulative dose history, if applicable  The patient's medication allergies and previous infusion related reactions, if applicable   Changes made to treatment plan:  N/A  Follow up needed:  N/A   Niels FORBES Molt, [facility], 03/23/2024  11:28 AM

## 2024-03-24 ENCOUNTER — Inpatient Hospital Stay: Admitting: Licensed Clinical Social Worker

## 2024-03-24 ENCOUNTER — Inpatient Hospital Stay

## 2024-03-24 VITALS — BP 141/68 | HR 80 | Temp 97.6°F | Resp 18 | Wt 171.2 lb

## 2024-03-24 DIAGNOSIS — C220 Liver cell carcinoma: Secondary | ICD-10-CM

## 2024-03-24 DIAGNOSIS — Z5112 Encounter for antineoplastic immunotherapy: Secondary | ICD-10-CM | POA: Diagnosis not present

## 2024-03-24 LAB — CBC WITH DIFFERENTIAL/PLATELET
Abs Immature Granulocytes: 0.01 K/uL (ref 0.00–0.07)
Basophils Absolute: 0 K/uL (ref 0.0–0.1)
Basophils Relative: 1 %
Eosinophils Absolute: 0.2 K/uL (ref 0.0–0.5)
Eosinophils Relative: 6 %
HCT: 40.8 % (ref 39.0–52.0)
Hemoglobin: 14.2 g/dL (ref 13.0–17.0)
Immature Granulocytes: 0 %
Lymphocytes Relative: 29 %
Lymphs Abs: 1 K/uL (ref 0.7–4.0)
MCH: 32.6 pg (ref 26.0–34.0)
MCHC: 34.8 g/dL (ref 30.0–36.0)
MCV: 93.6 fL (ref 80.0–100.0)
Monocytes Absolute: 0.4 K/uL (ref 0.1–1.0)
Monocytes Relative: 11 %
Neutro Abs: 1.9 K/uL (ref 1.7–7.7)
Neutrophils Relative %: 53 %
Platelets: 113 K/uL — ABNORMAL LOW (ref 150–400)
RBC: 4.36 MIL/uL (ref 4.22–5.81)
RDW: 13.7 % (ref 11.5–15.5)
WBC: 3.6 K/uL — ABNORMAL LOW (ref 4.0–10.5)
nRBC: 0 % (ref 0.0–0.2)

## 2024-03-24 LAB — COMPREHENSIVE METABOLIC PANEL WITH GFR
ALT: 18 U/L (ref 0–44)
AST: 29 U/L (ref 15–41)
Albumin: 3.4 g/dL — ABNORMAL LOW (ref 3.5–5.0)
Alkaline Phosphatase: 143 U/L — ABNORMAL HIGH (ref 38–126)
Anion gap: 13 (ref 5–15)
BUN: 7 mg/dL (ref 6–20)
CO2: 23 mmol/L (ref 22–32)
Calcium: 9.2 mg/dL (ref 8.9–10.3)
Chloride: 99 mmol/L (ref 98–111)
Creatinine, Ser: 0.93 mg/dL (ref 0.61–1.24)
GFR, Estimated: >60 mL/min (ref >60)
Glucose, Bld: 287 mg/dL — ABNORMAL HIGH (ref 70–99)
Potassium: 3.8 mmol/L (ref 3.5–5.1)
Sodium: 135 mmol/L (ref 135–145)
Total Bilirubin: 0.9 mg/dL (ref 0.0–1.2)
Total Protein: 7.1 g/dL (ref 6.5–8.1)

## 2024-03-24 LAB — TSH: TSH: 5.414 u[IU]/mL — ABNORMAL HIGH (ref 0.350–4.500)

## 2024-03-24 MED ORDER — TREMELIMUMAB-ACTL CHEMO INJECTION 25 MG/1.25ML
300.0000 mg | Freq: Once | INTRAVENOUS | Status: AC
  Administered 2024-03-24: 300 mg via INTRAVENOUS
  Filled 2024-03-24: qty 15

## 2024-03-24 MED ORDER — SODIUM CHLORIDE 0.9 % IV SOLN
1500.0000 mg | Freq: Once | INTRAVENOUS | Status: AC
  Administered 2024-03-24: 1500 mg via INTRAVENOUS
  Filled 2024-03-24: qty 30

## 2024-03-24 MED ORDER — SODIUM CHLORIDE 0.9 % IV SOLN: INTRAVENOUS | Status: DC

## 2024-03-24 NOTE — Patient Instructions (Signed)
 [facility] [place] - A DEPT OF [facility]  Discharge Instructions: Thank you for choosing [facility] to provide your oncology and hematology care.  If you have a lab appointment with the Cancer Center - please note that after April 8th, 2024, all labs will be drawn in the cancer center.  You do not have to check in or register with the main entrance as you have in the past but will complete your check-in in the cancer center.  Wear comfortable clothing and clothing appropriate for easy access to any Portacath or PICC line.   We strive to give you quality time with your provider. You may need to reschedule your appointment if you arrive late (15 or more minutes).  Arriving late affects you and other patients whose appointments are after yours.  Also, if you miss three or more appointments without notifying the office, you may be dismissed from the clinic at the provider's discretion.      For prescription refill requests, have your pharmacy contact our office and allow 72 hours for refills to be completed.    Today you received the following chemotherapy and/or immunotherapy agents Imjudo /Imfinzi    To help prevent nausea and vomiting after your treatment, we encourage you to take your nausea medication as directed.  Tremelimumab  Injection What is this medication? TREMELIMUMAB  (tre mel IM ue mab) treats liver cancer and lung cancer. It works by helping your immune system slow or stop the spread of cancer cells. It is a monoclonal antibody. This medicine may be used for other purposes; ask your health care provider or pharmacist if you have questions. COMMON BRAND NAME(S): IMJUDO  What should I tell my care team before I take this medication? They need to know if you have any of these conditions: Autoimmune conditions, such as Crohn disease, ulcerative colitis, lupus Nervous system conditions, such as Guillain-Barre syndrome or myasthenia gravis An unusual  or allergic reaction to tremelimumab , other medications, foods, dyes, or preservatives Pregnant or trying to get pregnant Breastfeeding How should I use this medication? This medication is infused into a vein. It is given by your care team in a hospital or clinic setting. A special MedGuide will be given to you before each treatment. Be sure to read this information carefully each time. Talk to your care team about the use of this medication in children. Special care may be needed. Overdosage: If you think you have taken too much of this medicine contact a poison control center or emergency room at once. NOTE: This medicine is only for you. Do not share this medicine with others. What if I miss a dose? Keep appointments for follow-up doses. It is important not to miss your dose. Call your care team if you are unable to keep an appointment. What may interact with this medication? Interactions have not been studied. This list may not describe all possible interactions. Give your health care provider a list of all the medicines, herbs, non-prescription drugs, or dietary supplements you use. Also tell them if you smoke, drink alcohol, or use illegal drugs. Some items may interact with your medicine. What should I watch for while using this medication? Your condition will be monitored carefully while you are receiving this medication. You may need blood work done while you are taking this medication. This medication may cause serious skin reactions. They can happen weeks to months after starting the medication. Contact your care team right away if you notice fevers  or flu-like symptoms with a rash. The rash may be red or purple and then turn into blisters or peeling of the skin. You may also notice a red rash with swelling of the face, lips, or lymph nodes in your neck or under your arms. Tell your care team right away if you have any change in your eyesight. Talk to your care team if you may be  pregnant. Serious birth defects can occur if you take this medication during pregnancy and for 3 months after the last dose. You will need a negative pregnancy test before starting this medication. Contraception is recommended while taking this medication and for 3 months after the last dose. Your care team can help you find the option that works for you. Do not breastfeed while taking this medication and for 3 months after the last dose. What side effects may I notice from receiving this medication? Side effects that you should report to your care team as soon as possible: Allergic reactions--skin rash, itching, hives, swelling of the face, lips, tongue, or throat Dry cough, shortness of breath or trouble breathing Eye pain, redness, irritation, or discharge with blurry or decreased vision Heart muscle inflammation--unusual weakness or fatigue, shortness of breath, chest pain, fast or irregular heartbeat, dizziness, swelling of the ankles, feet, or hands Hormone gland problems--headache, sensitivity to light, unusual weakness or fatigue, dizziness, fast or irregular heartbeat, increased sensitivity to cold or heat, excessive sweating, constipation, hair loss, increased thirst or amount of urine, tremors or shaking, irritability Infusion reactions--chest pain, shortness of breath or trouble breathing, feeling faint or lightheaded Kidney injury (glomerulonephritis)--decrease in the amount of urine, red or dark brown urine, foamy or bubbly urine, swelling of the ankles, hands, or feet Liver injury--right upper belly pain, loss of appetite, nausea, light-colored stool, dark yellow or brown urine, yellowing skin or eyes, unusual weakness or fatigue Pain, tingling, or numbness in the hands or feet, muscle weakness, change in vision, confusion or trouble speaking, loss of balance or coordination, trouble walking, seizures Pancreatitis--severe stomach pain that spreads to your back or gets worse after eating  or when touched, fever, nausea, vomiting Rash, fever, and swollen lymph nodes Redness, blistering, peeling, or loosening of the skin, including inside the mouth Stomach pain that is severe, does not go away, or gets worse Sudden or severe stomach pain, bloody diarrhea, fever, nausea, vomiting Side effects that usually do not require medical attention (report these to your care team if they continue or are bothersome): Bone, joint, or muscle pain Diarrhea Fatigue Loss of appetite Nausea Skin rash This list may not describe all possible side effects. Call your doctor for medical advice about side effects. You may report side effects to FDA at 1-800-FDA-1088. Where should I keep my medication? This medication is given in a hospital or clinic. It will not be stored at home. NOTE: This sheet is a summary. It may not cover all possible information. If you have questions about this medicine, talk to your doctor, pharmacist, or health care provider.  2024 Elsevier/Gold Standard (2022-10-14 00:00:00) BELOW ARE SYMPTOMS THAT SHOULD BE REPORTED IMMEDIATELY: *FEVER GREATER THAN 100.4 F (38 C) OR HIGHER *CHILLS OR SWEATING *NAUSEA AND VOMITING THAT IS NOT CONTROLLED WITH YOUR NAUSEA MEDICATION *UNUSUAL SHORTNESS OF BREATH *UNUSUAL BRUISING OR BLEEDING *URINARY PROBLEMS (pain or burning when urinating, or frequent urination) *BOWEL PROBLEMS (unusual diarrhea, constipation, pain near the anus) TENDERNESS IN MOUTH AND THROAT WITH OR WITHOUT PRESENCE OF ULCERS (sore throat, sores in mouth, or  a toothache) UNUSUAL RASH, SWELLING OR PAIN  UNUSUAL VAGINAL DISCHARGE OR ITCHING   Items with * indicate a potential emergency and should be followed up as soon as possible or go to the Emergency Department if any problems should occur.  Please show the CHEMOTHERAPY ALERT CARD or IMMUNOTHERAPY ALERT CARD at check-in to the Emergency Department and triage nurse.   Durvalumab  Injection What is this  medication? DURVALUMAB  (dur VAL ue mab) treats some types of cancer. It works by helping your immune system slow or stop the spread of cancer cells. It is a monoclonal antibody. This medicine may be used for other purposes; ask your health care provider or pharmacist if you have questions. COMMON BRAND NAME(S): IMFINZI  What should I tell my care team before I take this medication? They need to know if you have any of these conditions: Allogeneic stem cell transplant (uses someone else's stem cells) Autoimmune diseases, such as Crohn disease, ulcerative colitis, lupus History of chest radiation Nervous system problems, such as Guillain-Barre syndrome, myasthenia gravis Organ transplant An unusual or allergic reaction to durvalumab , other medications, foods, dyes, or preservatives Pregnant or trying to get pregnant Breast-feeding How should I use this medication? This medication is infused into a vein. It is given by your care team in a hospital or clinic setting. A special MedGuide will be given to you before each treatment. Be sure to read this information carefully each time. Talk to your care team about the use of this medication in children. Special care may be needed. Overdosage: If you think you have taken too much of this medicine contact a poison control center or emergency room at once. NOTE: This medicine is only for you. Do not share this medicine with others. What if I miss a dose? Keep appointments for follow-up doses. It is important not to miss your dose. Call your care team if you are unable to keep an appointment. What may interact with this medication? Interactions have not been studied. This list may not describe all possible interactions. Give your health care provider a list of all the medicines, herbs, non-prescription drugs, or dietary supplements you use. Also tell them if you smoke, drink alcohol, or use illegal drugs. Some items may interact with your medicine. What  should I watch for while using this medication? Your condition will be monitored carefully while you are receiving this medication. You may need blood work while taking this medication. This medication may cause serious skin reactions. They can happen weeks to months after starting the medication. Contact your care team right away if you notice fevers or flu-like symptoms with a rash. The rash may be red or purple and then turn into blisters or peeling of the skin. You may also notice a red rash with swelling of the face, lips, or lymph nodes in your neck or under your arms. Tell your care team right away if you have any change in your eyesight. Talk to your care team if you may be pregnant. Serious birth defects can occur if you take this medication during pregnancy and for 3 months after the last dose. You will need a negative pregnancy test before starting this medication. Contraception is recommended while taking this medication and for 3 months after the last dose. Your care team can help you find the option that works for you. Do not breastfeed while taking this medication and for 3 months after the last dose. What side effects may I notice from receiving this medication?  Side effects that you should report to your care team as soon as possible: Allergic reactions--skin rash, itching, hives, swelling of the face, lips, tongue, or throat Dry cough, shortness of breath or trouble breathing Eye pain, redness, irritation, or discharge with blurry or decreased vision Heart muscle inflammation--unusual weakness or fatigue, shortness of breath, chest pain, fast or irregular heartbeat, dizziness, swelling of the ankles, feet, or hands Hormone gland problems--headache, sensitivity to light, unusual weakness or fatigue, dizziness, fast or irregular heartbeat, increased sensitivity to cold or heat, excessive sweating, constipation, hair loss, increased thirst or amount of urine, tremors or shaking,  irritability Infusion reactions--chest pain, shortness of breath or trouble breathing, feeling faint or lightheaded Kidney injury (glomerulonephritis)--decrease in the amount of urine, red or dark brown urine, foamy or bubbly urine, swelling of the ankles, hands, or feet Liver injury--right upper belly pain, loss of appetite, nausea, light-colored stool, dark yellow or brown urine, yellowing skin or eyes, unusual weakness or fatigue Pain, tingling, or numbness in the hands or feet, muscle weakness, change in vision, confusion or trouble speaking, loss of balance or coordination, trouble walking, seizures Rash, fever, and swollen lymph nodes Redness, blistering, peeling, or loosening of the skin, including inside the mouth Sudden or severe stomach pain, bloody diarrhea, fever, nausea, vomiting Side effects that usually do not require medical attention (report these to your care team if they continue or are bothersome): Bone, joint, or muscle pain Diarrhea Fatigue Loss of appetite Nausea Skin rash This list may not describe all possible side effects. Call your doctor for medical advice about side effects. You may report side effects to FDA at 1-800-FDA-1088. Where should I keep my medication? This medication is given in a hospital or clinic. It will not be stored at home. NOTE: This sheet is a summary. It may not cover all possible information. If you have questions about this medicine, talk to your doctor, pharmacist, or health care provider.  2024 Elsevier/Gold Standard (2022-01-08 00:00:00)  Should you have questions after your visit or need to cancel or reschedule your appointment, please contact [facility] [place] - A DEPT OF [facility] [phone]  and follow the prompts.  Office hours are 8:00 a.m. to 4:30 p.m. Monday - Friday. Please note that voicemails left after 4:00 p.m. may not be returned until the following business day.  We are closed weekends and major  holidays. You have access to a nurse at all times for urgent questions. Please call the main number to the clinic [phone] and follow the prompts.  For any non-urgent questions, you may also contact your provider using MyChart. We now offer e-Visits for anyone 13 and older to request care online for non-urgent symptoms. For details visit [email or web address].   Also download the MyChart app! Go to the app store, search MyChart, open the app, select [place], and log in with your MyChart username and password.

## 2024-03-24 NOTE — Progress Notes (Signed)
 Patient presents today for C1D1 Imjudo /Imfinzi  infusion.  Patient is in satisfactory condition with no new complaints voiced.  Vital signs are stable.  Labs reviewed and all labs are within treatment parameters. Patient's blood sugar noted to be 287. Patient does not want to take the standing order insulin  dose here at the clinic. We will proceed with treatment per MD orders.    Treatment given today per MD orders. Tolerated infusion without adverse affects. Vital signs stable. No complaints at this time. Discharged from clinic ambulatory with cane in stable condition. Alert and oriented x 3. F/U with [facility] as scheduled.

## 2024-03-24 NOTE — Progress Notes (Signed)
 [facility] Clinical Social Work  Initial Assessment   Danny [name] is a 58 y.o. year old male accompanied by patient and niece, Danny [name]. Clinical Social Work was referred by medical provider for initial assessment.   SDOH (Social Determinants of Health) assessments performed: Yes SDOH Interventions    Flowsheet Row Office Visit from 11/21/2023 in [facility] [place] - [facility] Dept Of [place]  SDOH Interventions   Food Insecurity Interventions Intervention Not Indicated  Housing Interventions Intervention Not Indicated  Transportation Interventions Intervention Not Indicated  Utilities Interventions Intervention Not Indicated    SDOH Screenings   Food Insecurity: No Food Insecurity (03/09/2024)   Received from [facility]  Housing: Low Risk  (11/21/2023)  Transportation Needs: No Transportation Needs (03/09/2024)   Received from [facility]  Utilities: Low Risk  (03/09/2024)   Received from [facility]  Depression [phone]): Low Risk  (03/24/2024)  Tobacco Use: High Risk (03/12/2024)     Distress Screen completed: Yes    03/22/2024   11:37 AM  ONCBCN DISTRESS SCREENING  Screening Type Initial Screening  How much distress have you been experiencing in the past week? (0-10) 5  Practical concerns type Taking care of myself  Social concerns type Relationship with children  Emotional concerns type Worry or anxiety;Grief or loss  Physical Concerns Type  Sleep;Tobacco use;Loss or change of physical abilities      Family/Social Information:  Housing Arrangement: patient lives with his niece and her husband.  Pt initially diagnosed in 2022 with recent progression.  Pt is legally blind and utilizes a cane for ambulation.   Family members/support persons in your life? Pt has two daughters who reside locally.  Support predominantly comes from his niece and her husband. Transportation concerns: no  Employment: Disabled .  Income source: Special educational needs teacher  Income Financial concerns: Yes, current concerns Type of concern: Utilities and Rent/ mortgage Food access concerns: no Religious or spiritual practice: Yes-[employer] Advanced directives: Not known Services Currently in place:  none  Coping/ Adjustment to diagnosis: Patient understands treatment plan and what happens next? yes Concerns about diagnosis and/or treatment: Overwhelmed by information and Quality of life Patient reported stressors: Finances and Adjusting to my illness Hopes and/or priorities: pt's priority is to continue treatment w/ the hope of positive results Patient enjoys time with family/ friends Current coping skills/ strengths: Motivation for treatment/growth  and Supportive family/friends     SUMMARY: Current SDOH Barriers:  Financial constraints related to limited income  Clinical Social Work Clinical Goal(s):  Scientist, research (life sciences) options for unmet needs related to:  Financial Strain   Interventions: Discussed common feeling and emotions when being diagnosed with cancer, and the importance of support during treatment Informed patient of the support team roles and support services at [facility] Provided CSW contact information and encouraged patient to call with any questions or concerns Referred patient to community resources: Wadie Rung and provided contact information for the [employer] to acquire DME.  Discussed [employer] and guidelines for applying w/ pt and niece.  Pt does receive food stamps and meets presumptive eligibility requirements.      Follow Up Plan: Patient will contact CSW with any support or resource needs Patient verbalizes understanding of plan: Yes    Danny JONELLE Manna, LCSW Clinical Social Worker [facility]  Patient is participating in a Managed Medicaid Plan:  Yes

## 2024-03-25 ENCOUNTER — Telehealth: Payer: Self-pay

## 2024-03-25 LAB — T4: T4, Total: 5.6 ug/dL (ref 4.5–12.0)

## 2024-03-25 NOTE — Telephone Encounter (Signed)
 24 hour follow up call. No issues at this time. Patient is resting today.

## 2024-04-01 ENCOUNTER — Other Ambulatory Visit: Payer: Self-pay

## 2024-04-02 ENCOUNTER — Ambulatory Visit
Admission: RE | Admit: 2024-04-02 | Discharge: 2024-04-02 | Disposition: A | Discharge status: Home / Self Care | Attending: Nurse Practitioner | Admitting: Nurse Practitioner

## 2024-04-02 DIAGNOSIS — Z203 Contact with and (suspected) exposure to rabies: Secondary | ICD-10-CM

## 2024-04-02 DIAGNOSIS — Z23 Encounter for immunization: Secondary | ICD-10-CM | POA: Diagnosis not present

## 2024-04-02 MED ORDER — RABIES VACCINE, PCEC IM SUSR
1.0000 mL | Freq: Once | INTRAMUSCULAR | Status: AC
  Administered 2024-04-02: 1 mL via INTRAMUSCULAR

## 2024-04-02 NOTE — ED Triage Notes (Signed)
 Pt here for 5th rabies vaccine .

## 2024-04-05 ENCOUNTER — Other Ambulatory Visit: Payer: Self-pay

## 2024-04-14 ENCOUNTER — Other Ambulatory Visit: Payer: Self-pay | Admitting: Oncology

## 2024-04-14 DIAGNOSIS — C220 Liver cell carcinoma: Secondary | ICD-10-CM

## 2024-04-15 ENCOUNTER — Other Ambulatory Visit: Payer: Self-pay

## 2024-04-20 ENCOUNTER — Encounter: Payer: Self-pay | Admitting: Oncology

## 2024-04-20 NOTE — Assessment & Plan Note (Addendum)
 Patient has a history of hepatitis C that is not treated.  - Follow-up with Dr.[name]

## 2024-04-20 NOTE — Assessment & Plan Note (Addendum)
 Patient has a history of hepatocellular carcinoma initially diagnosed in 10/22.  Patient is not a candidate for liver transplant secondary to cirrhosis and hepatitis C. S/p TARE and Y90 in 03/14/2022 and 08/24/2022 respectively.   MRI from 11/27/2023 showed progression of disease Patient had chemo embolization on 12/18/2023 and had partial response and new lesions on recent MRI. CT chest with no evidence of metastasis but does have some stable small lesions AFP: 21 -03/24/2024: Started on Tremelimumab  and Durvalumab  per HIMALAYA trial  - Considering patient has multilobar disease unresponsive to local treatment, did not refer for radiation therapy because of the extent of disease. - Did not consider atezo/Bev because of the history of stroke -Labs reviewed today.  AST greater than 3 times normal, ALT elevated 2.  Bilirubin elevated at 1.4.  CBC grossly normal.  AFP pending -Considering elevated LFTs.  Will hold treatment.  Will repeat labs on Monday and consider treatment if the return to normal. - Will repeat an MRI after 4 cycles to assess for response.   -Will repeat AFP with every cycle initially and then every 3 months after that  Return to clinic for follow up prior to the third cycle

## 2024-04-20 NOTE — Assessment & Plan Note (Addendum)
 Current use of half a pack per day, reduced from two packs per day. Previous discussions about smoking cessation.  - Encouraged attempts to cut down smoking

## 2024-04-20 NOTE — Progress Notes (Signed)
 Patient Care Team: Vick Lurie, FNP as PCP - General (Family Medicine) Cindie Carlin POUR, DO as Consulting Physician (Gastroenterology) Davonna Siad, MD as Medical Oncologist (Medical Oncology) Celestia Joesph SQUIBB, RN as Oncology Nurse Navigator (Medical Oncology)  Clinic Day:  04/21/2024  Referring physician: Vick Lurie, FNP   CHIEF COMPLAINT:  CC: Hepatocellular carcinoma   ASSESSMENT & PLAN:   Assessment & Plan: Danny [name]  is a 58 y.o. male with hepatocellular carcinoma  Assessment & Plan Hepatocellular carcinoma [facility]) Patient has a history of hepatocellular carcinoma initially diagnosed in 10/22.  Patient is not a candidate for liver transplant secondary to cirrhosis and hepatitis C. S/p TARE and Y90 in 03/14/2022 and 08/24/2022 respectively.   MRI from 11/27/2023 showed progression of disease Patient had chemo embolization on 12/18/2023 and had partial response and new lesions on recent MRI. CT chest with no evidence of metastasis but does have some stable small lesions AFP: 21 -03/24/2024: Started on Tremelimumab  and Durvalumab  per HIMALAYA trial  - Considering patient has multilobar disease unresponsive to local treatment, did not refer for radiation therapy because of the extent of disease. - Did not consider atezo/Bev because of the history of stroke -Labs reviewed today.  AST greater than 3 times normal, ALT elevated 2.  Bilirubin elevated at 1.4.  CBC grossly normal.  AFP pending -Considering elevated LFTs.  Will hold treatment.  Will repeat labs on Monday and consider treatment if the return to normal. - Will repeat an MRI after 4 cycles to assess for response.   -Will repeat AFP with every cycle initially and then every 3 months after that  Return to clinic for follow up prior to the third cycle  Elevated liver enzymes Likely secondary to immunotherapy considering patient had normal levels prior to the start of immunotherapy. Greater than 3  times upper limit of normal of AST Patient denies alcohol use but has a history of hepatitis C  - Will hold treatment today. - Will repeat labs on Monday.  If worsening, will start patient on steroids.  If better, will give immunotherapy that day. -Will add PT/INR to the labs on Monday -Recommended to avoid Tylenol  and alcohol Hepatitis C virus infection without hepatic coma, unspecified chronicity Patient has a history of hepatitis C that is not treated.  - Follow-up with Dr.[name] Tobacco use Current use of half a pack per day, reduced from two packs per day. Previous discussions about smoking cessation.  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 No bleeding or petechiae reported  - Continue to monitor Pain management Gets pain medications for chronic pain pain clinic.  - Recommended patient to change from Percocet to oxycodone  to avoid Tylenol    The patient understands the plans discussed today and is in agreement with them.  He knows to contact our office if he develops concerns prior to his next appointment.  I provided 20 minutes of face-to-face time during this encounter and > 50% was spent counseling as documented under my assessment and plan.    Siad Davonna, MD  [facility] [place] - A DEPT OF [facility] [address] [place] [address] Dept: [phone] Dept Fax: [phone]   Orders Placed This Encounter  Procedures   AFP tumor marker    Standing Status:   Future    Number of Occurrences:   1    Expected Date:   04/21/2024  Expiration Date:   04/20/2025   Comprehensive metabolic panel    Standing Status:   Future    Expected Date:   04/26/2024    Expiration Date:   07/25/2024   Protime-INR    Standing Status:   Future    Expected Date:   04/26/2024    Expiration Date:   07/25/2024     ONCOLOGY HISTORY:   Oncology History   Hepatocellular carcinoma (HCC)  06/29/2021 Imaging   MRI liver with and without contrast:  Arterially hyperenhancing mass of the central posterior left lobe of the liver, hepatic segment II/III, measuring 2.7 x 2.1 cm, with some evidence of washout and capsular enhancement. LI-RADS category 5, consistent with hepatocellular carcinoma. 2. Additional subtle arterially hyperenhancing lesion of the central liver dome, hepatic segment VIII, measuring 1.1 cm, without evidence of washout or capsule. LI-RADS category 3, intermediate suspicion for hepatocellular carcinoma. Attention on follow-up.   06/29/2021 Initial Diagnosis   Hepatocellular carcinoma (HCC)   01/18/2022 Imaging   MRI abdomen with and without contrast:   IMPRESSION: Decreased size and lack of internal enhancement of treated mass in segment 3 of the left lobe. (LR-TR Nonviable)   Increased size of 15 mm hypervascular lesion in the liver dome. (LI-RADS Category 5: Definitely HCC)   9 mm hypervascular lesion near the junction of the right and left lobes, not seen on prior exam. (LI-RADS Category 3: Intermediate probability of malignancy)   No evidence of abdominal metastatic disease.   Stable splenomegaly and portosystemic collaterals, consistent with portal venous hypertension.   03/14/2022 Procedure   Segment 8 radiation segmentectomy- Right Y90 embolization   07/18/2022 Imaging   MRI abdomen with and without contrast:  1. Low-level, diffuse hyperenhancement of the left lobe of the liver as well as hepatic segment VIII with associated atrophy, but without suspicious focal hyperenhancement. Findings are consistent treated multifocal hepatocellular carcinoma without evidence of residual viable disease. LI-RADS TR, nonviable. 2. No new liver lesions. 3. No evidence of lymphadenopathy or metastatic disease in the abdomen. 4. Splenomegaly and large splenic varices.   08/24/2022 Procedure   Left hepatic TARE   05/23/2023 Imaging    MRI abdomen with and without contrast:  IMPRESSION: 1. Unchanged post treatment atrophy of the left lobe of the liver as well as hepatic segment VIII, with associated post treatment hyperemia following radio embolization. No suspicious associated contrast enhancement. LI-RADS TR, nonviable. 2. Unchanged, somewhat ill-defined arterial hyperenhancement in hepatic segment IVB measuring 1.7 x 1.5 cm, on today's examination with some evidence of heterogeneous internal washout, but without evidence of capsular enhancement. This is technically characterized as LI-RADS category 4, suspicious for an additional focus of hepatocellular carcinoma and warrants close attention on follow-up. 3. Multiple additional unchanged arterially hyperenhancing lesions scattered throughout the liver measuring up to 1.3 cm, which were new on prior examination dated 11/27/2022 but unchanged in the immediate interval. These may reflect dystrophic or regenerative nodularity or alternately additional foci of hepatocellular carcinoma, LI-RADS category 3, intermediate suspicion for hepatocellular carcinoma. Recommend follow-up in 6 months. 4. Cirrhosis and stigmata portal hypertension including splenomegaly and varices.    05/23/2023 Tumor Marker   AFP: 7.0   11/12/2023 Imaging   MRI abdomen with and without contrast:  IMPRESSION: 1. Redemonstration of multiple liver lesions, as described above. 2. There is a 1.2 x 1.4 cm LI-RADS 5 lesion in the left hepatic lobe, segment 4B, which is slightly decreased in size since the prior study. 3. There are 2, LI-RADS 5  lesions in the right hepatic dome, which are slightly increased in size since the prior study. 4. There are 3, LI-RADS 4 lesions in the right hepatic lobe, segments 6/7, which are grossly stable. 5. There is posttreatment cavity in the right hepatic dome, at the junctions of segment 8 and 4A a measuring approximately 2.1 x 2.2 cm. The cavity exhibits peripheral irregular  enhancing wall which exhibits washout on delayed/equilibrium phase images, favoring viable tumor. 6. No new liver lesion seen.   11/12/2023 Cancer Staging   Staging form: Liver, AJCC 8th Edition - Clinical stage from 11/12/2023: Stage IB (rcT1b, cN0, cM0) - Signed by Davonna Siad, MD on 11/21/2023 Stage prefix: Recurrence   01/29/2024 Imaging   CT abdomen and pelvis with contrast:  Suspected multifocal HCC, including: -2.4 cm rim-enhancing lesion at the junction of segments (series 6/image 12), previously 2.2 cm  -1.6 cm enhancing lesion medially in segment 7 (series 6/image 7), previously 1.4 cm  -1.6 cm vague enhancing lesion in segment 4b (series 6/image 29), previously 1.4 cm   Additionally, there is a 2.6 cm irregular enhancing lesion in segment 2 (series 6 / image 14) obscured on prior MR, favoring an additional lesion.   IMPRESSION: 1. Suspected multifocal HCC, favored to be overall similar to prior MR, noting differences in technique. 2. Cirrhosis with moderate splenomegaly.   02/25/2024 Cancer Staging   Staging form: Liver, AJCC 8th Edition - Pathologic stage from 02/25/2024: ycT2(m), ycN0, cM0 - Signed by Davonna Siad, MD on 02/25/2024 Stage prefix: Post-therapy Response to neoadjuvant therapy: Partial response Bilateral cancer: Yes Multiple tumors: Yes   03/10/2024 Imaging   CT chest with contrast:  IMPRESSION: No significant interval change.   Few scattered tiny lung nodules identified under 5 mm are similar to previous. No new dominant lung nodule.   03/24/2024 -  Chemotherapy   Patient is on Treatment Plan : Hepatocellular Carcinoma Tremelimumab -actl C1 D1 + Durvalumab  q28d          Current Treatment: Tremelimumab  plus durvalumab   INTERVAL HISTORY:  Danny [name] is here today for follow up. Patient is accompanied by his niece today.  Patient tolerated his first cycle well had some fatigue afterwards.  Denies rash, diarrhea, shortness of breath.  He has no  new complaints today.  Overall, he is doing very well.  I have reviewed the past medical history, past surgical history, social history and family history with the patient and they are unchanged from previous note.  ALLERGIES:  is allergic to trazodone, benadryl  [diphenhydramine ], and ultram  [tramadol ].  MEDICATIONS:  Current Outpatient Medications  Medication Sig Dispense Refill   albuterol  (VENTOLIN  HFA) 108 (90 Base) MCG/ACT inhaler Inhale 2 puffs into the lungs every 6 (six) hours as needed for wheezing or shortness of breath.     ALPRAZolam (XANAX) 0.25 MG tablet Take 0.25 mg by mouth at bedtime.     Cholecalciferol  (VITAMIN D -3) 125 MCG (5000 UT) TABS Take 5,000 Units by mouth daily.     citalopram  (CELEXA ) 20 MG tablet Take 20 mg by mouth daily.     cyclobenzaprine  (FLEXERIL ) 5 MG tablet Take 5 mg by mouth daily as needed for muscle spasms.     gabapentin  (NEURONTIN ) 400 MG capsule Take 400 mg by mouth 2 (two) times daily as needed.     lidocaine -prilocaine  (EMLA ) cream Apply to affected area once 30 g 3   Melatonin 10 MG CAPS Take 10 mg by mouth at bedtime.     metFORMIN  (GLUCOPHAGE ) 1000  MG tablet Take 1,000 mg by mouth 2 (two) times daily.     mirtazapine (REMERON) 7.5 MG tablet Take 1 tablet every day by oral route for 90 days.     naloxone (NARCAN) nasal spray 4 mg/0.1 mL Place 1 spray into the nose once.     ondansetron  (ZOFRAN ) 8 MG tablet Take 1 tablet (8 mg total) by mouth every 8 (eight) hours as needed for nausea or vomiting. 30 tablet 1   oxyCODONE -acetaminophen  (PERCOCET) 10-325 MG tablet Take 1 tablet by mouth every 4 (four) hours as needed for pain. 15 tablet 0   oxyCODONE -acetaminophen  (PERCOCET/ROXICET) 5-325 MG tablet Take 1 tablet by mouth every 6 (six) hours as needed for severe pain (pain score 7-10). 6 tablet 0   pantoprazole  (PROTONIX ) 40 MG tablet Take 1 tablet (40 mg total) by mouth daily. 30 tablet 2   prochlorperazine  (COMPAZINE ) 10 MG tablet Take 1 tablet  (10 mg total) by mouth every 6 (six) hours as needed for nausea or vomiting. 30 tablet 1   QUEtiapine  (SEROQUEL ) 100 MG tablet Take 100 mg by mouth at bedtime.     rosuvastatin  (CRESTOR ) 10 MG tablet Take 1 tablet (10 mg total) by mouth daily. 30 tablet 11   Sofosbuvir-Velpatasvir 400-100 MG TABS Take 1 tablet by mouth daily.     No current facility-administered medications for this visit.    REVIEW OF SYSTEMS:   Constitutional: Denies fevers, chills or abnormal weight loss Eyes: Denies blurriness of vision Ears, nose, mouth, throat, and face: Denies mucositis or sore throat Respiratory: Denies cough, dyspnea or wheezes Cardiovascular: Denies palpitation, chest discomfort or lower extremity swelling Gastrointestinal:  Denies nausea, heartburn or change in bowel habits Skin: Denies abnormal skin rashes Lymphatics: Denies new lymphadenopathy or easy bruising Neurological:Denies numbness, tingling or new weaknesses Behavioral/Psych: Mood is stable, no new changes  All other systems were reviewed with the patient and are negative.   VITALS:  Blood pressure (!) 127/59, pulse 86, temperature 98.7 F (37.1 C), temperature source Tympanic, resp. rate 18, weight 173 lb (78.5 kg), SpO2 96%.  Wt Readings from Last 3 Encounters:  04/21/24 173 lb (78.5 kg)  03/24/24 171 lb 3.2 oz (77.7 kg)  03/17/24 173 lb (78.5 kg)    Body mass index is 25.55 kg/m.  Performance status (ECOG): 2 - Symptomatic, <50% confined to bed  PHYSICAL EXAM:   GENERAL:alert, no distress and comfortable SKIN: Multiple bite wounds on bilateral legs that are at various stages of healing  LUNGS: clear to auscultation and percussion with normal breathing effort HEART: regular rate & rhythm and no murmurs and no lower extremity edema ABDOMEN:abdomen soft, non-tender and normal bowel sounds Musculoskeletal:no cyanosis of digits and no clubbing  NEURO: alert & oriented x 3 with fluent speech  LABORATORY DATA:  I have  reviewed the data as listed    Component Value Date/Time   NA 135 04/21/2024 0840   K 3.9 04/21/2024 0840   CL 101 04/21/2024 0840   CO2 23 04/21/2024 0840   GLUCOSE 188 (H) 04/21/2024 0840   BUN 13 04/21/2024 0840   CREATININE 0.81 04/21/2024 0840   CREATININE 0.83 06/11/2021 0907   CALCIUM  9.2 04/21/2024 0840   PROT 7.0 04/21/2024 0840   ALBUMIN  3.2 (L) 04/21/2024 0840   AST 154 (H) 04/21/2024 0840   ALT 51 (H) 04/21/2024 0840   ALKPHOS 118 04/21/2024 0840   BILITOT 1.4 (H) 04/21/2024 0840   GFRNONAA >60 04/21/2024 0840   GFRAA >60 05/24/2019  1858   Lab Results  Component Value Date   WBC 4.8 04/21/2024   NEUTROABS 2.8 04/21/2024   HGB 13.0 04/21/2024   HCT 38.2 (L) 04/21/2024   MCV 94.1 04/21/2024   PLT 104 (L) 04/21/2024      Chemistry      Component Value Date/Time   NA 135 04/21/2024 0840   K 3.9 04/21/2024 0840   CL 101 04/21/2024 0840   CO2 23 04/21/2024 0840   BUN 13 04/21/2024 0840   CREATININE 0.81 04/21/2024 0840   CREATININE 0.83 06/11/2021 0907      Component Value Date/Time   CALCIUM  9.2 04/21/2024 0840   ALKPHOS 118 04/21/2024 0840   AST 154 (H) 04/21/2024 0840   ALT 51 (H) 04/21/2024 0840   BILITOT 1.4 (H) 04/21/2024 0840      Latest Reference Range & Units 02/25/24 09:43  AFP, Serum, Tumor Marker 0.0 - 8.4 ng/mL 21.0 (H)  (H): Data is abnormally high  RADIOGRAPHIC STUDIES: I have personally reviewed the radiological images as listed and agreed with the findings in the report.  None new to review

## 2024-04-21 ENCOUNTER — Inpatient Hospital Stay (HOSPITAL_BASED_OUTPATIENT_CLINIC_OR_DEPARTMENT_OTHER): Admitting: Oncology

## 2024-04-21 ENCOUNTER — Encounter: Payer: Self-pay | Admitting: *Deleted

## 2024-04-21 ENCOUNTER — Inpatient Hospital Stay

## 2024-04-21 ENCOUNTER — Inpatient Hospital Stay: Attending: Oncology

## 2024-04-21 VITALS — BP 127/59 | HR 86 | Temp 98.7°F | Resp 18 | Wt 173.0 lb

## 2024-04-21 DIAGNOSIS — F17200 Nicotine dependence, unspecified, uncomplicated: Secondary | ICD-10-CM | POA: Diagnosis not present

## 2024-04-21 DIAGNOSIS — Z720 Tobacco use: Secondary | ICD-10-CM

## 2024-04-21 DIAGNOSIS — B1920 Unspecified viral hepatitis C without hepatic coma: Secondary | ICD-10-CM

## 2024-04-21 DIAGNOSIS — C220 Liver cell carcinoma: Secondary | ICD-10-CM | POA: Diagnosis present

## 2024-04-21 DIAGNOSIS — R52 Pain, unspecified: Secondary | ICD-10-CM

## 2024-04-21 DIAGNOSIS — Z5112 Encounter for antineoplastic immunotherapy: Secondary | ICD-10-CM | POA: Diagnosis present

## 2024-04-21 DIAGNOSIS — R748 Abnormal levels of other serum enzymes: Secondary | ICD-10-CM | POA: Insufficient documentation

## 2024-04-21 DIAGNOSIS — D696 Thrombocytopenia, unspecified: Secondary | ICD-10-CM | POA: Insufficient documentation

## 2024-04-21 LAB — COMPREHENSIVE METABOLIC PANEL WITH GFR
ALT: 51 U/L — ABNORMAL HIGH (ref 0–44)
AST: 154 U/L — ABNORMAL HIGH (ref 15–41)
Albumin: 3.2 g/dL — ABNORMAL LOW (ref 3.5–5.0)
Alkaline Phosphatase: 118 U/L (ref 38–126)
Anion gap: 11 (ref 5–15)
BUN: 13 mg/dL (ref 6–20)
CO2: 23 mmol/L (ref 22–32)
Calcium: 9.2 mg/dL (ref 8.9–10.3)
Chloride: 101 mmol/L (ref 98–111)
Creatinine, Ser: 0.81 mg/dL (ref 0.61–1.24)
GFR, Estimated: >60 mL/min (ref >60)
Glucose, Bld: 188 mg/dL — ABNORMAL HIGH (ref 70–99)
Potassium: 3.9 mmol/L (ref 3.5–5.1)
Sodium: 135 mmol/L (ref 135–145)
Total Bilirubin: 1.4 mg/dL — ABNORMAL HIGH (ref 0.0–1.2)
Total Protein: 7 g/dL (ref 6.5–8.1)

## 2024-04-21 LAB — CBC WITH DIFFERENTIAL/PLATELET
Abs Immature Granulocytes: 0.01 K/uL (ref 0.00–0.07)
Basophils Absolute: 0.1 K/uL (ref 0.0–0.1)
Basophils Relative: 1 %
Eosinophils Absolute: 0.4 K/uL (ref 0.0–0.5)
Eosinophils Relative: 8 %
HCT: 38.2 % — ABNORMAL LOW (ref 39.0–52.0)
Hemoglobin: 13 g/dL (ref 13.0–17.0)
Immature Granulocytes: 0 %
Lymphocytes Relative: 22 %
Lymphs Abs: 1.1 K/uL (ref 0.7–4.0)
MCH: 32 pg (ref 26.0–34.0)
MCHC: 34 g/dL (ref 30.0–36.0)
MCV: 94.1 fL (ref 80.0–100.0)
Monocytes Absolute: 0.5 K/uL (ref 0.1–1.0)
Monocytes Relative: 11 %
Neutro Abs: 2.8 K/uL (ref 1.7–7.7)
Neutrophils Relative %: 58 %
Platelets: 104 K/uL — ABNORMAL LOW (ref 150–400)
RBC: 4.06 MIL/uL — ABNORMAL LOW (ref 4.22–5.81)
RDW: 13.8 % (ref 11.5–15.5)
WBC: 4.8 K/uL (ref 4.0–10.5)
nRBC: 0 % (ref 0.0–0.2)

## 2024-04-21 NOTE — Assessment & Plan Note (Addendum)
 Gets pain medications for chronic pain pain clinic.  - Recommended patient to change from Percocet to oxycodone  to avoid Tylenol 

## 2024-04-21 NOTE — Assessment & Plan Note (Signed)
 Likely secondary to liver cirrhosis No bleeding or petechiae reported  - Continue to monitor

## 2024-04-21 NOTE — Progress Notes (Signed)
 No treatment today per Dr. Davonna patient will return on Monday with possible treatment.

## 2024-04-21 NOTE — Assessment & Plan Note (Signed)
 Likely secondary to immunotherapy considering patient had normal levels prior to the start of immunotherapy. Greater than 3 times upper limit of normal of AST Patient denies alcohol use but has a history of hepatitis C  - Will hold treatment today. - Will repeat labs on Monday.  If worsening, will start patient on steroids.  If better, will give immunotherapy that day. -Will add PT/INR to the labs on Monday -Recommended to avoid Tylenol  and alcohol

## 2024-04-21 NOTE — Patient Instructions (Signed)
 [facility] at [facility] Discharge Instructions   You were seen and examined today by Dr. Davonna.  She reviewed the results of your lab work which are mostly normal/stable. Your liver enzymes are elevated.   We will hold your treatment today.   Return as scheduled.    Thank you for choosing [facility] at [facility] to provide your oncology and hematology care.  To afford each patient quality time with our provider, please arrive at least 15 minutes before your scheduled appointment time.   If you have a lab appointment with the Cancer Center please come in thru the Main Entrance and check in at the main information desk.  You need to re-schedule your appointment should you arrive 10 or more minutes late.  We strive to give you quality time with our providers, and arriving late affects you and other patients whose appointments are after yours.  Also, if you no show three or more times for appointments you may be dismissed from the clinic at the providers discretion.     Again, thank you for choosing [facility].  Our hope is that these requests will decrease the amount of time that you wait before being seen by our physicians.       _____________________________________________________________  Should you have questions after your visit to [facility], please contact our office at [phone] and follow the prompts.  Our office hours are 8:00 a.m. and 4:30 p.m. Monday - Friday.  Please note that voicemails left after 4:00 p.m. may not be returned until the following business day.  We are closed weekends and major holidays.  You do have access to a nurse 24-7, just call the main number to the clinic [phone] and do not press any options, hold on the line and a nurse will answer the phone.    For prescription refill requests, have your pharmacy contact our office and allow 72 hours.    Due to Covid, you will need  to wear a mask upon entering the hospital. If you do not have a mask, a mask will be given to you at the Main Entrance upon arrival. For doctor visits, patients may have 1 support person age 8 or older with them. For treatment visits, patients can not have anyone with them due to social distancing guidelines and our immunocompromised population.

## 2024-04-22 ENCOUNTER — Other Ambulatory Visit: Payer: Self-pay

## 2024-04-22 LAB — AFP TUMOR MARKER: AFP, Serum, Tumor Marker: 19.4 ng/mL — ABNORMAL HIGH (ref 0.0–8.4)

## 2024-04-26 ENCOUNTER — Inpatient Hospital Stay

## 2024-04-26 ENCOUNTER — Other Ambulatory Visit: Payer: Self-pay

## 2024-04-26 ENCOUNTER — Other Ambulatory Visit: Payer: Self-pay | Admitting: Oncology

## 2024-04-26 VITALS — BP 136/60 | HR 80 | Temp 97.9°F | Resp 18 | Ht 69.0 in | Wt 167.8 lb

## 2024-04-26 DIAGNOSIS — Z5112 Encounter for antineoplastic immunotherapy: Secondary | ICD-10-CM | POA: Diagnosis not present

## 2024-04-26 DIAGNOSIS — C220 Liver cell carcinoma: Secondary | ICD-10-CM

## 2024-04-26 LAB — COMPREHENSIVE METABOLIC PANEL WITH GFR
ALT: 26 U/L (ref 0–44)
AST: 32 U/L (ref 15–41)
Albumin: 3.2 g/dL — ABNORMAL LOW (ref 3.5–5.0)
Alkaline Phosphatase: 122 U/L (ref 38–126)
Anion gap: 13 (ref 5–15)
BUN: 9 mg/dL (ref 6–20)
CO2: 22 mmol/L (ref 22–32)
Calcium: 9.3 mg/dL (ref 8.9–10.3)
Chloride: 101 mmol/L (ref 98–111)
Creatinine, Ser: 0.84 mg/dL (ref 0.61–1.24)
GFR, Estimated: >60 mL/min (ref >60)
Glucose, Bld: 236 mg/dL — ABNORMAL HIGH (ref 70–99)
Potassium: 3.6 mmol/L (ref 3.5–5.1)
Sodium: 136 mmol/L (ref 135–145)
Total Bilirubin: <0.2 mg/dL (ref 0.0–1.2)
Total Protein: 7.3 g/dL (ref 6.5–8.1)

## 2024-04-26 LAB — PROTIME-INR
INR: 1.2 (ref 0.8–1.2)
Prothrombin Time: 15.5 s — ABNORMAL HIGH (ref 11.4–15.2)

## 2024-04-26 MED ORDER — INSULIN ASPART 100 UNIT/ML IJ SOLN
10.0000 [IU] | Freq: Once | INTRAMUSCULAR | Status: AC
  Administered 2024-04-26: 10 [IU] via SUBCUTANEOUS
  Filled 2024-04-26: qty 1

## 2024-04-26 MED ORDER — TRIAMCINOLONE ACETONIDE 0.1 % EX CREA: 1.0000 | TOPICAL_CREAM | Freq: Two times a day (BID) | CUTANEOUS | 2 refills | Status: AC

## 2024-04-26 MED ORDER — SODIUM CHLORIDE 0.9 % IV SOLN: INTRAVENOUS | Status: DC

## 2024-04-26 MED ORDER — SODIUM CHLORIDE 0.9 % IV SOLN
1500.0000 mg | Freq: Once | INTRAVENOUS | Status: AC
  Administered 2024-04-26: 1500 mg via INTRAVENOUS
  Filled 2024-04-26: qty 30

## 2024-04-26 MED ORDER — TRIAMCINOLONE ACETONIDE 0.5 % EX CREA: 1.0000 | TOPICAL_CREAM | Freq: Three times a day (TID) | CUTANEOUS | 1 refills | Status: DC

## 2024-04-26 NOTE — Patient Instructions (Signed)
 [facility] - A DEPT OF [facility]  Discharge Instructions: Thank you for choosing [facility] to provide your oncology and hematology care.  If you have a lab appointment with the Cancer Center - please note that after April 8th, 2024, all labs will be drawn in the cancer center.  You do not have to check in or register with the main entrance as you have in the past but will complete your check-in in the cancer center.  Wear comfortable clothing and clothing appropriate for easy access to any Portacath or PICC line.   We strive to give you quality time with your provider. You may need to reschedule your appointment if you arrive late (15 or more minutes).  Arriving late affects you and other patients whose appointments are after yours.  Also, if you miss three or more appointments without notifying the office, you may be dismissed from the clinic at the provider's discretion.      For prescription refill requests, have your pharmacy contact our office and allow 72 hours for refills to be completed.    Today you received the following chemotherapy and/or immunotherapy agents Imfinzi .  Durvalumab  Injection What is this medication? DURVALUMAB  (dur VAL ue mab) treats some types of cancer. It works by helping your immune system slow or stop the spread of cancer cells. It is a monoclonal antibody. This medicine may be used for other purposes; ask your health care provider or pharmacist if you have questions. COMMON BRAND NAME(S): IMFINZI  What should I tell my care team before I take this medication? They need to know if you have any of these conditions: Allogeneic stem cell transplant (uses someone else's stem cells) Autoimmune diseases, such as Crohn disease, ulcerative colitis, lupus History of chest radiation Nervous system problems, such as Guillain-Barre syndrome, myasthenia gravis Organ transplant An unusual or allergic reaction to durvalumab ,  other medications, foods, dyes, or preservatives Pregnant or trying to get pregnant Breast-feeding How should I use this medication? This medication is infused into a vein. It is given by your care team in a hospital or clinic setting. A special MedGuide will be given to you before each treatment. Be sure to read this information carefully each time. Talk to your care team about the use of this medication in children. Special care may be needed. Overdosage: If you think you have taken too much of this medicine contact a poison control center or emergency room at once. NOTE: This medicine is only for you. Do not share this medicine with others. What if I miss a dose? Keep appointments for follow-up doses. It is important not to miss your dose. Call your care team if you are unable to keep an appointment. What may interact with this medication? Interactions have not been studied. This list may not describe all possible interactions. Give your health care provider a list of all the medicines, herbs, non-prescription drugs, or dietary supplements you use. Also tell them if you smoke, drink alcohol, or use illegal drugs. Some items may interact with your medicine. What should I watch for while using this medication? Your condition will be monitored carefully while you are receiving this medication. You may need blood work while taking this medication. This medication may cause serious skin reactions. They can happen weeks to months after starting the medication. Contact your care team right away if you notice fevers or flu-like symptoms with a rash. The rash may be red or  purple and then turn into blisters or peeling of the skin. You may also notice a red rash with swelling of the face, lips, or lymph nodes in your neck or under your arms. Tell your care team right away if you have any change in your eyesight. Talk to your care team if you may be pregnant. Serious birth defects can occur if you take  this medication during pregnancy and for 3 months after the last dose. You will need a negative pregnancy test before starting this medication. Contraception is recommended while taking this medication and for 3 months after the last dose. Your care team can help you find the option that works for you. Do not breastfeed while taking this medication and for 3 months after the last dose. What side effects may I notice from receiving this medication? Side effects that you should report to your care team as soon as possible: Allergic reactions--skin rash, itching, hives, swelling of the face, lips, tongue, or throat Dry cough, shortness of breath or trouble breathing Eye pain, redness, irritation, or discharge with blurry or decreased vision Heart muscle inflammation--unusual weakness or fatigue, shortness of breath, chest pain, fast or irregular heartbeat, dizziness, swelling of the ankles, feet, or hands Hormone gland problems--headache, sensitivity to light, unusual weakness or fatigue, dizziness, fast or irregular heartbeat, increased sensitivity to cold or heat, excessive sweating, constipation, hair loss, increased thirst or amount of urine, tremors or shaking, irritability Infusion reactions--chest pain, shortness of breath or trouble breathing, feeling faint or lightheaded Kidney injury (glomerulonephritis)--decrease in the amount of urine, red or dark brown urine, foamy or bubbly urine, swelling of the ankles, hands, or feet Liver injury--right upper belly pain, loss of appetite, nausea, light-colored stool, dark yellow or brown urine, yellowing skin or eyes, unusual weakness or fatigue Pain, tingling, or numbness in the hands or feet, muscle weakness, change in vision, confusion or trouble speaking, loss of balance or coordination, trouble walking, seizures Rash, fever, and swollen lymph nodes Redness, blistering, peeling, or loosening of the skin, including inside the mouth Sudden or severe  stomach pain, bloody diarrhea, fever, nausea, vomiting Side effects that usually do not require medical attention (report these to your care team if they continue or are bothersome): Bone, joint, or muscle pain Diarrhea Fatigue Loss of appetite Nausea Skin rash This list may not describe all possible side effects. Call your doctor for medical advice about side effects. You may report side effects to FDA at 1-800-FDA-1088. Where should I keep my medication? This medication is given in a hospital or clinic. It will not be stored at home. NOTE: This sheet is a summary. It may not cover all possible information. If you have questions about this medicine, talk to your doctor, pharmacist, or health care provider.  2024 Elsevier/Gold Standard (2022-01-08 00:00:00)   To help prevent nausea and vomiting after your treatment, we encourage you to take your nausea medication as directed.  BELOW ARE SYMPTOMS THAT SHOULD BE REPORTED IMMEDIATELY: *FEVER GREATER THAN 100.4 F (38 C) OR HIGHER *CHILLS OR SWEATING *NAUSEA AND VOMITING THAT IS NOT CONTROLLED WITH YOUR NAUSEA MEDICATION *UNUSUAL SHORTNESS OF BREATH *UNUSUAL BRUISING OR BLEEDING *URINARY PROBLEMS (pain or burning when urinating, or frequent urination) *BOWEL PROBLEMS (unusual diarrhea, constipation, pain near the anus) TENDERNESS IN MOUTH AND THROAT WITH OR WITHOUT PRESENCE OF ULCERS (sore throat, sores in mouth, or a toothache) UNUSUAL RASH, SWELLING OR PAIN  UNUSUAL VAGINAL DISCHARGE OR ITCHING   Items with * indicate a potential  emergency and should be followed up as soon as possible or go to the Emergency Department if any problems should occur.  Please show the CHEMOTHERAPY ALERT CARD or IMMUNOTHERAPY ALERT CARD at check-in to the Emergency Department and triage nurse.  Should you have questions after your visit or need to cancel or reschedule your appointment, please contact [facility] [place] - A DEPT OF [facility] [phone]  and follow the prompts.  Office hours are 8:00 a.m. to 4:30 p.m. Monday - Friday. Please note that voicemails left after 4:00 p.m. may not be returned until the following business day.  We are closed weekends and major holidays. You have access to a nurse at all times for urgent questions. Please call the main number to the clinic [phone] and follow the prompts.  For any non-urgent questions, you may also contact your provider using MyChart. We now offer e-Visits for anyone 37 and older to request care online for non-urgent symptoms. For details visit [email or web address].   Also download the MyChart app! Go to the app store, search MyChart, open the app, select [facility], and log in with your MyChart username and password.  Due to elevated liver enzymes (LFT results) recommended Percocet be changed to Oxycodone  to prevent further damage to liver per Dr Davonna.

## 2024-04-26 NOTE — Progress Notes (Signed)
 Patient presents today for chemotherapy infusion.  Patient c/o right great toe pain and possible infection. Patient reports stubbing toe 3 days ago and now has drainage and odor. Denies any fevers. Patient also has rash to face and abd. Dr Davonna aware and is coming to assess patient.   Vital signs are stable.  Labs reviewed and all labs are within treatment parameters.  Patient blood glucose 236, patient to get Novolog  10 unites per order. We will proceed with treatment per MD orders.    Patient assessed by Dr Davonna prior to treatment, approval given to continue with treatment.  Right great toe dressed with petroleum dressing, kerlix, and coban. Patient tolerated well. Prescription sent for Kenalog  ointment.   Patient tolerated treatment well with no complaints voiced.  Patient left via wheelchair in stable condition.  Vital signs stable at discharge.  Follow up as scheduled.

## 2024-04-30 ENCOUNTER — Encounter: Payer: Self-pay | Admitting: Radiology

## 2024-05-11 ENCOUNTER — Encounter: Payer: Self-pay | Admitting: Oncology

## 2024-05-17 ENCOUNTER — Encounter: Payer: Self-pay | Admitting: Oncology

## 2024-05-19 ENCOUNTER — Inpatient Hospital Stay: Admitting: Oncology

## 2024-05-19 ENCOUNTER — Inpatient Hospital Stay

## 2024-05-25 ENCOUNTER — Inpatient Hospital Stay (HOSPITAL_BASED_OUTPATIENT_CLINIC_OR_DEPARTMENT_OTHER): Admitting: Oncology

## 2024-05-25 ENCOUNTER — Inpatient Hospital Stay

## 2024-05-25 ENCOUNTER — Inpatient Hospital Stay: Attending: Oncology

## 2024-05-25 VITALS — BP 137/69 | HR 92 | Temp 97.6°F | Resp 18 | Wt 168.4 lb

## 2024-05-25 VITALS — BP 142/69 | HR 77 | Temp 97.2°F | Resp 18

## 2024-05-25 DIAGNOSIS — C220 Liver cell carcinoma: Secondary | ICD-10-CM

## 2024-05-25 DIAGNOSIS — R52 Pain, unspecified: Secondary | ICD-10-CM

## 2024-05-25 DIAGNOSIS — R748 Abnormal levels of other serum enzymes: Secondary | ICD-10-CM | POA: Diagnosis not present

## 2024-05-25 DIAGNOSIS — R197 Diarrhea, unspecified: Secondary | ICD-10-CM | POA: Insufficient documentation

## 2024-05-25 DIAGNOSIS — Z720 Tobacco use: Secondary | ICD-10-CM

## 2024-05-25 DIAGNOSIS — Z79620 Long term (current) use of immunosuppressive biologic: Secondary | ICD-10-CM | POA: Diagnosis not present

## 2024-05-25 DIAGNOSIS — B1920 Unspecified viral hepatitis C without hepatic coma: Secondary | ICD-10-CM | POA: Insufficient documentation

## 2024-05-25 DIAGNOSIS — D696 Thrombocytopenia, unspecified: Secondary | ICD-10-CM

## 2024-05-25 DIAGNOSIS — Z5112 Encounter for antineoplastic immunotherapy: Secondary | ICD-10-CM | POA: Diagnosis present

## 2024-05-25 DIAGNOSIS — S91101A Unspecified open wound of right great toe without damage to nail, initial encounter: Secondary | ICD-10-CM | POA: Insufficient documentation

## 2024-05-25 DIAGNOSIS — R21 Rash and other nonspecific skin eruption: Secondary | ICD-10-CM | POA: Insufficient documentation

## 2024-05-25 DIAGNOSIS — K7460 Unspecified cirrhosis of liver: Secondary | ICD-10-CM | POA: Diagnosis not present

## 2024-05-25 LAB — CBC WITH DIFFERENTIAL/PLATELET
Abs Immature Granulocytes: 0.01 K/uL (ref 0.00–0.07)
Basophils Absolute: 0 K/uL (ref 0.0–0.1)
Basophils Relative: 1 %
Eosinophils Absolute: 0.4 K/uL (ref 0.0–0.5)
Eosinophils Relative: 8 %
HCT: 41.7 % (ref 39.0–52.0)
Hemoglobin: 14.2 g/dL (ref 13.0–17.0)
Immature Granulocytes: 0 %
Lymphocytes Relative: 20 %
Lymphs Abs: 1 K/uL (ref 0.7–4.0)
MCH: 31.2 pg (ref 26.0–34.0)
MCHC: 34.1 g/dL (ref 30.0–36.0)
MCV: 91.6 fL (ref 80.0–100.0)
Monocytes Absolute: 0.3 K/uL (ref 0.1–1.0)
Monocytes Relative: 7 %
Neutro Abs: 3.1 K/uL (ref 1.7–7.7)
Neutrophils Relative %: 64 %
Platelets: 136 K/uL — ABNORMAL LOW (ref 150–400)
RBC: 4.55 MIL/uL (ref 4.22–5.81)
RDW: 12.9 % (ref 11.5–15.5)
WBC: 4.8 K/uL (ref 4.0–10.5)
nRBC: 0 % (ref 0.0–0.2)

## 2024-05-25 LAB — COMPREHENSIVE METABOLIC PANEL WITH GFR
ALT: 22 U/L (ref 0–44)
AST: 33 U/L (ref 15–41)
Albumin: 3.5 g/dL (ref 3.5–5.0)
Alkaline Phosphatase: 120 U/L (ref 38–126)
Anion gap: 13 (ref 5–15)
BUN: 7 mg/dL (ref 6–20)
CO2: 23 mmol/L (ref 22–32)
Calcium: 9.4 mg/dL (ref 8.9–10.3)
Chloride: 101 mmol/L (ref 98–111)
Creatinine, Ser: 0.8 mg/dL (ref 0.61–1.24)
GFR, Estimated: >60 mL/min (ref >60)
Glucose, Bld: 162 mg/dL — ABNORMAL HIGH (ref 70–99)
Potassium: 3.6 mmol/L (ref 3.5–5.1)
Sodium: 137 mmol/L (ref 135–145)
Total Bilirubin: 0.9 mg/dL (ref 0.0–1.2)
Total Protein: 7.7 g/dL (ref 6.5–8.1)

## 2024-05-25 LAB — TSH: TSH: 3.387 u[IU]/mL (ref 0.350–4.500)

## 2024-05-25 MED ORDER — SODIUM CHLORIDE 0.9 % IV SOLN: INTRAVENOUS | Status: DC

## 2024-05-25 MED ORDER — LOPERAMIDE HCL 2 MG PO CAPS: ORAL_CAPSULE | ORAL | 1 refills | Status: DC

## 2024-05-25 MED ORDER — SODIUM CHLORIDE 0.9 % IV SOLN
1500.0000 mg | Freq: Once | INTRAVENOUS | Status: AC
  Administered 2024-05-25: 1500 mg via INTRAVENOUS
  Filled 2024-05-25: qty 30

## 2024-05-25 NOTE — Assessment & Plan Note (Addendum)
 Reports 10 episodes yesterday Did not take imodium   -Will give IV fluids today -Will send a prescription for imodium  as needed

## 2024-05-25 NOTE — Assessment & Plan Note (Addendum)
 Gets pain medications for chronic pain pain clinic.  - Recommended patient to change from Percocet to oxycodone  to avoid Tylenol 

## 2024-05-25 NOTE — Progress Notes (Signed)
 Patient Care Team: Vick Lurie, FNP as PCP - General (Family Medicine) Cindie Carlin POUR, DO as Consulting Physician (Gastroenterology) Davonna Siad, MD as Medical Oncologist (Medical Oncology) Celestia Joesph SQUIBB, RN as Oncology Nurse Navigator (Medical Oncology)  Clinic Day:  05/25/2024  Referring physician: Vick Lurie, FNP   CHIEF COMPLAINT:  CC: Hepatocellular carcinoma   ASSESSMENT & PLAN:   Assessment & Plan: Danny [name]  is a 58 y.o. male with hepatocellular carcinoma  Assessment & Plan Hepatocellular carcinoma [facility]) Patient has a history of hepatocellular carcinoma initially diagnosed in 10/22.  Patient is not a candidate for liver transplant secondary to cirrhosis and hepatitis C. S/p TARE and Y90 in 03/14/2022 and 08/24/2022 respectively.   MRI from 11/27/2023 showed progression of disease Patient had chemo embolization on 12/18/2023 and had partial response and new lesions on recent MRI. CT chest with no evidence of metastasis but does have some stable small lesions AFP: 21 -03/24/2024: Started on Tremelimumab  and Durvalumab  per HIMALAYA trial. Held cycle 2 for a week for elevated AST  - Considering patient has multilobar disease unresponsive to local treatment, did not refer for radiation therapy because of the extent of disease. - Did not consider atezo/Bev because of the history of stroke -Labs reviewed today.  CMP: Normal creatinine, normal LFTs. CBC: Normal WBC and hemoglobin, platelets 136-improved from prior.   -AFP pending today.  Previous AFP showed slight improvement - Will repeat an MRI or CT CAP prior to fourth cycle to assess for response.   -Will repeat AFP with every cycle initially and then every 3 months after that -Physical exam stable today. Proceed with treatment today   Return to clinic for follow up prior to the third cycle  Elevated liver enzymes Likely secondary to immunotherapy considering patient had normal levels prior to  the start of immunotherapy. Greater than 3 times upper limit of normal of AST Patient denies alcohol use but has a history of hepatitis C  Resolved at this time Pain management Gets pain medications for chronic pain pain clinic.  - Recommended patient to change from Percocet to oxycodone  to avoid Tylenol  Hepatitis C virus infection without hepatic coma, unspecified chronicity Patient has a history of hepatitis C that is not treated.  - Follow-up with Dr.[name] Tobacco use Current use of half a pack per day, reduced from two packs per day. Previous discussions about smoking cessation.  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 No bleeding or petechiae reported  - Continue to monitor Open wound of right great toe, initial encounter Patient has no pain today Likely secondary to diabetes  -Has an appointment scheduled with podiatry -Continue to follow with them Rash Likely immune therapy induced Improved with topical steroids  -Continue topical steroids as needed -Continue to monitor for now  Diarrhea, unspecified type Reports 10 episodes yesterday Did not take imodium   -Will give IV fluids today -Will send a prescription for imodium  as needed  The patient understands the plans discussed today and is in agreement with them.  He knows to contact our office if he develops concerns prior to his next appointment.  I provided 30 minutes of face-to-face time during this encounter and > 50% was spent counseling as documented under my assessment and plan.    LILLETTE Verneta SAUNDERS Teague,acting as a Neurosurgeon for Siad Davonna, MD.,have documented all relevant documentation on the behalf of Siad Davonna, MD,as directed by  Siad Davonna, MD while in the presence of  [name], MD.  LILLETTE [name] MD, have reviewed the above documentation for accuracy and completeness, and I agree with the above.    [name], MD   [facility]  - A DEPT [facility] [address] [place] [address] Dept: [phone] Dept Fax: [phone]   Orders Placed This Encounter  Procedures   NM PET Image Restag (PS) Skull Base To Thigh    Standing Status:   Future    Expected Date:   06/24/2024    Expiration Date:   05/25/2025    If indicated for the ordered procedure, I authorize the administration of a radiopharmaceutical per Radiology protocol:   Yes    Preferred imaging location?:   [facility]   AFP tumor marker     ONCOLOGY HISTORY:   Oncology History  Hepatocellular carcinoma (HCC)  06/29/2021 Imaging   MRI liver with and without contrast:  Arterially hyperenhancing mass of the central posterior left lobe of the liver, hepatic segment II/III, measuring 2.7 x 2.1 cm, with some evidence of washout and capsular enhancement. LI-RADS category 5, consistent with hepatocellular carcinoma. 2. Additional subtle arterially hyperenhancing lesion of the central liver dome, hepatic segment VIII, measuring 1.1 cm, without evidence of washout or capsule. LI-RADS category 3, intermediate suspicion for hepatocellular carcinoma. Attention on follow-up.   06/29/2021 Initial Diagnosis   Hepatocellular carcinoma (HCC)   01/18/2022 Imaging   MRI abdomen with and without contrast:   IMPRESSION: Decreased size and lack of internal enhancement of treated mass in segment 3 of the left lobe. (LR-TR Nonviable)   Increased size of 15 mm hypervascular lesion in the liver dome. (LI-RADS Category 5: Definitely HCC)   9 mm hypervascular lesion near the junction of the right and left lobes, not seen on prior exam. (LI-RADS Category 3: Intermediate probability of malignancy)   No evidence of abdominal metastatic disease.   Stable splenomegaly and portosystemic collaterals, consistent with portal venous hypertension.   03/14/2022 Procedure   Segment 8 radiation  segmentectomy- Right Y90 embolization   07/18/2022 Imaging   MRI abdomen with and without contrast:  1. Low-level, diffuse hyperenhancement of the left lobe of the liver as well as hepatic segment VIII with associated atrophy, but without suspicious focal hyperenhancement. Findings are consistent treated multifocal hepatocellular carcinoma without evidence of residual viable disease. LI-RADS TR, nonviable. 2. No new liver lesions. 3. No evidence of lymphadenopathy or metastatic disease in the abdomen. 4. Splenomegaly and large splenic varices.   08/24/2022 Procedure   Left hepatic TARE   05/23/2023 Imaging   MRI abdomen with and without contrast:  IMPRESSION: 1. Unchanged post treatment atrophy of the left lobe of the liver as well as hepatic segment VIII, with associated post treatment hyperemia following radio embolization. No suspicious associated contrast enhancement. LI-RADS TR, nonviable. 2. Unchanged, somewhat ill-defined arterial hyperenhancement in hepatic segment IVB measuring 1.7 x 1.5 cm, on today's examination with some evidence of heterogeneous internal washout, but without evidence of capsular enhancement. This is technically characterized as LI-RADS category 4, suspicious for an additional focus of hepatocellular carcinoma and warrants close attention on follow-up. 3. Multiple additional unchanged arterially hyperenhancing lesions scattered throughout the liver measuring up to 1.3 cm, which were new on prior examination dated 11/27/2022 but unchanged in the immediate interval. These may reflect dystrophic or regenerative nodularity or alternately additional foci of hepatocellular carcinoma, LI-RADS category 3, intermediate suspicion for hepatocellular carcinoma. Recommend follow-up in  6 months. 4. Cirrhosis and stigmata portal hypertension including splenomegaly and varices.    05/23/2023 Tumor Marker   AFP: 7.0   11/12/2023 Imaging   MRI abdomen with and without  contrast:  IMPRESSION: 1. Redemonstration of multiple liver lesions, as described above. 2. There is a 1.2 x 1.4 cm LI-RADS 5 lesion in the left hepatic lobe, segment 4B, which is slightly decreased in size since the prior study. 3. There are 2, LI-RADS 5 lesions in the right hepatic dome, which are slightly increased in size since the prior study. 4. There are 3, LI-RADS 4 lesions in the right hepatic lobe, segments 6/7, which are grossly stable. 5. There is posttreatment cavity in the right hepatic dome, at the junctions of segment 8 and 4A a measuring approximately 2.1 x 2.2 cm. The cavity exhibits peripheral irregular enhancing wall which exhibits washout on delayed/equilibrium phase images, favoring viable tumor. 6. No new liver lesion seen.   11/12/2023 Cancer Staging   Staging form: Liver, AJCC 8th Edition - Clinical stage from 11/12/2023: Stage IB (rcT1b, cN0, cM0) - Signed by Davonna Siad, MD on 11/21/2023 Stage prefix: Recurrence   01/29/2024 Imaging   CT abdomen and pelvis with contrast:  Suspected multifocal HCC, including: -2.4 cm rim-enhancing lesion at the junction of segments (series 6/image 12), previously 2.2 cm  -1.6 cm enhancing lesion medially in segment 7 (series 6/image 7), previously 1.4 cm  -1.6 cm vague enhancing lesion in segment 4b (series 6/image 29), previously 1.4 cm   Additionally, there is a 2.6 cm irregular enhancing lesion in segment 2 (series 6 / image 14) obscured on prior MR, favoring an additional lesion.   IMPRESSION: 1. Suspected multifocal HCC, favored to be overall similar to prior MR, noting differences in technique. 2. Cirrhosis with moderate splenomegaly.   02/25/2024 Cancer Staging   Staging form: Liver, AJCC 8th Edition - Pathologic stage from 02/25/2024: ycT2(m), ycN0, cM0 - Signed by Davonna Siad, MD on 02/25/2024 Stage prefix: Post-therapy Response to neoadjuvant therapy: Partial response Bilateral cancer: Yes Multiple tumors: Yes    03/10/2024 Imaging   CT chest with contrast:  IMPRESSION: No significant interval change.   Few scattered tiny lung nodules identified under 5 mm are similar to previous. No new dominant lung nodule.   03/24/2024 -  Chemotherapy   Patient is on Treatment Plan : Hepatocellular Carcinoma Tremelimumab -actl C1 D1 + Durvalumab  q28d          Current Treatment: Tremelimumab  plus durvalumab   INTERVAL HISTORY:  Danny [name] is here today for follow up. Patient is accompanied by his niece today.    Danny [name] reports generalized weakness and decreased strength. He also notes diarrhea with 10-15 BM's a day. He is not taking imodium .   His immunotherapy induced rash has improved. He is using his prescribed steroid cream as needed.   [place] notes necrosis on his right big toe, likely secondary to DM, and is attempting to schedule an appointment with podiatry.   I have reviewed the past medical history, past surgical history, social history and family history with the patient and they are unchanged from previous note.  ALLERGIES:  is allergic to trazodone, benadryl  [diphenhydramine ], and ultram  [tramadol ].  MEDICATIONS:  Current Outpatient Medications  Medication Sig Dispense Refill   albuterol  (VENTOLIN  HFA) 108 (90 Base) MCG/ACT inhaler Inhale 2 puffs into the lungs every 6 (six) hours as needed for wheezing or shortness of breath.     ALPRAZolam (XANAX) 0.25 MG tablet Take 0.25 mg  by mouth at bedtime.     Cholecalciferol  (VITAMIN D -3) 125 MCG (5000 UT) TABS Take 5,000 Units by mouth daily.     citalopram  (CELEXA ) 20 MG tablet Take 20 mg by mouth daily.     cyclobenzaprine  (FLEXERIL ) 5 MG tablet Take 5 mg by mouth daily as needed for muscle spasms.     gabapentin  (NEURONTIN ) 400 MG capsule Take 400 mg by mouth 2 (two) times daily as needed.     lidocaine -prilocaine  (EMLA ) cream Apply to affected area once 30 g 3   loperamide  (IMODIUM ) 2 MG capsule Take 2 tablets by mouth after the first  loose stool, then 1 tablet after each loose stool. Do not exceed 5 doses per day 30 capsule 1   Melatonin 10 MG CAPS Take 10 mg by mouth at bedtime.     metFORMIN  (GLUCOPHAGE ) 1000 MG tablet Take 1,000 mg by mouth 2 (two) times daily.     mirtazapine (REMERON) 7.5 MG tablet Take 1 tablet every day by oral route for 90 days.     naloxone (NARCAN) nasal spray 4 mg/0.1 mL Place 1 spray into the nose once.     ondansetron  (ZOFRAN ) 8 MG tablet Take 1 tablet (8 mg total) by mouth every 8 (eight) hours as needed for nausea or vomiting. 30 tablet 1   oxyCODONE -acetaminophen  (PERCOCET) 10-325 MG tablet Take 1 tablet by mouth every 4 (four) hours as needed for pain. 15 tablet 0   oxyCODONE -acetaminophen  (PERCOCET/ROXICET) 5-325 MG tablet Take 1 tablet by mouth every 6 (six) hours as needed for severe pain (pain score 7-10). 6 tablet 0   prochlorperazine  (COMPAZINE ) 10 MG tablet Take 1 tablet (10 mg total) by mouth every 6 (six) hours as needed for nausea or vomiting. 30 tablet 1   QUEtiapine  (SEROQUEL ) 100 MG tablet Take 100 mg by mouth at bedtime.     Sofosbuvir-Velpatasvir 400-100 MG TABS Take 1 tablet by mouth daily.     triamcinolone  cream (KENALOG ) 0.1 % Apply 1 Application topically 2 (two) times daily. 60 g 2   pantoprazole  (PROTONIX ) 40 MG tablet Take 1 tablet (40 mg total) by mouth daily. (Patient not taking: Reported on 05/25/2024) 30 tablet 2   rosuvastatin  (CRESTOR ) 10 MG tablet Take 1 tablet (10 mg total) by mouth daily. (Patient not taking: Reported on 05/25/2024) 30 tablet 11   No current facility-administered medications for this visit.   Facility-Administered Medications Ordered in Other Visits  Medication Dose Route Frequency Provider Last Rate Last Admin   0.9 %  sodium chloride  infusion   Intravenous Continuous Davonna Siad, MD 500 mL/hr at 05/25/24 1036 New Bag at 05/25/24 1036   0.9 %  sodium chloride  infusion   Intravenous Continuous Davonna Siad, MD 10 mL/hr at 05/25/24 1034  New Bag at 05/25/24 1034    REVIEW OF SYSTEMS:   Constitutional: Denies fevers, chills or abnormal weight loss Eyes: Denies blurriness of vision Ears, nose, mouth, throat, and face: Denies mucositis or sore throat Respiratory: Denies cough, dyspnea or wheezes Cardiovascular: Denies palpitation, chest discomfort or lower extremity swelling Gastrointestinal:  Denies nausea, heartburn or change in bowel habits Skin: Denies abnormal skin rashes Lymphatics: Denies new lymphadenopathy or easy bruising Neurological:Denies numbness, tingling or new weaknesses Behavioral/Psych: Mood is stable, no new changes  All other systems were reviewed with the patient and are negative.   VITALS:  Blood pressure 137/69, pulse 92, temperature 97.6 F (36.4 C), temperature source Oral, resp. rate 18, weight 168 lb 6.4 oz (76.4  kg), SpO2 97%.  Wt Readings from Last 3 Encounters:  05/25/24 168 lb 6.4 oz (76.4 kg)  04/26/24 167 lb 12.8 oz (76.1 kg)  04/21/24 173 lb (78.5 kg)    Body mass index is 24.87 kg/m.  Performance status (ECOG): 2 - Symptomatic, <50% confined to bed  PHYSICAL EXAM:   GENERAL:alert, no distress and comfortable SKIN: Erythematous rash on his chin, necrotic wound on his right great toe LUNGS: clear to auscultation and percussion with normal breathing effort HEART: regular rate & rhythm and no murmurs and no lower extremity edema ABDOMEN:abdomen soft, non-tender and normal bowel sounds Musculoskeletal:no cyanosis of digits and no clubbing  NEURO: alert & oriented x 3 with fluent speech  LABORATORY DATA:  I have reviewed the data as listed    Component Value Date/Time   NA 137 05/25/2024 0900   K 3.6 05/25/2024 0900   CL 101 05/25/2024 0900   CO2 23 05/25/2024 0900   GLUCOSE 162 (H) 05/25/2024 0900   BUN 7 05/25/2024 0900   CREATININE 0.80 05/25/2024 0900   CREATININE 0.83 06/11/2021 0907   CALCIUM  9.4 05/25/2024 0900   PROT 7.7 05/25/2024 0900   ALBUMIN  3.5 05/25/2024  0900   AST 33 05/25/2024 0900   ALT 22 05/25/2024 0900   ALKPHOS 120 05/25/2024 0900   BILITOT 0.9 05/25/2024 0900   GFRNONAA >60 05/25/2024 0900   GFRAA >60 05/24/2019 1858   Lab Results  Component Value Date   WBC 4.8 05/25/2024   NEUTROABS 3.1 05/25/2024   HGB 14.2 05/25/2024   HCT 41.7 05/25/2024   MCV 91.6 05/25/2024   PLT 136 (L) 05/25/2024      Chemistry      Component Value Date/Time   NA 137 05/25/2024 0900   K 3.6 05/25/2024 0900   CL 101 05/25/2024 0900   CO2 23 05/25/2024 0900   BUN 7 05/25/2024 0900   CREATININE 0.80 05/25/2024 0900   CREATININE 0.83 06/11/2021 0907      Component Value Date/Time   CALCIUM  9.4 05/25/2024 0900   ALKPHOS 120 05/25/2024 0900   AST 33 05/25/2024 0900   ALT 22 05/25/2024 0900   BILITOT 0.9 05/25/2024 0900      Latest Reference Range & Units 04/21/24 08:40  AFP, Serum, Tumor Marker 0.0 - 8.4 ng/mL 19.4 (H)  (H): Data is abnormally high   Latest Reference Range & Units 05/25/24 09:00  TSH 0.350 - 4.500 uIU/mL 3.387   RADIOGRAPHIC STUDIES: I have personally reviewed the radiological images as listed and agreed with the findings in the report.  None new to review

## 2024-05-25 NOTE — Assessment & Plan Note (Addendum)
 Likely secondary to immunotherapy considering patient had normal levels prior to the start of immunotherapy. Greater than 3 times upper limit of normal of AST Patient denies alcohol use but has a history of hepatitis C  Resolved at this time

## 2024-05-25 NOTE — Patient Instructions (Signed)

## 2024-05-25 NOTE — Progress Notes (Signed)
 Patient has been examined by Dr. Davonna. Vital signs and labs have been reviewed by MD - ANC, Creatinine, LFTs, hemoglobin, and platelets have been reviewed by M.D. - pt may proceed with treatment.  Primary RN and pharmacy notified.

## 2024-05-25 NOTE — Progress Notes (Signed)
Patient tolerated chemotherapy with no complaints voiced.  Side effects with management reviewed with understanding verbalized.  Peripheral IV site clean and dry with no bruising or swelling noted at site.  Good blood return noted before and after administration of chemotherapy.  Band aid applied.  Patient left in satisfactory condition with VSS and no s/s of distress noted.

## 2024-05-25 NOTE — Assessment & Plan Note (Addendum)
 Likely secondary to liver cirrhosis No bleeding or petechiae reported  - Continue to monitor

## 2024-05-25 NOTE — Assessment & Plan Note (Addendum)
 Patient has no pain today Likely secondary to diabetes  -Has an appointment scheduled with podiatry -Continue to follow with them

## 2024-05-25 NOTE — Patient Instructions (Signed)
 [facility] [place] - A DEPT OF [facility]  Discharge Instructions: Thank you for choosing [facility] to provide your oncology and hematology care.  If you have a lab appointment with the Cancer Center - please note that after April 8th, 2024, all labs will be drawn in the cancer center.  You do not have to check in or register with the main entrance as you have in the past but will complete your check-in in the cancer center.  Wear comfortable clothing and clothing appropriate for easy access to any Portacath or PICC line.   We strive to give you quality time with your provider. You may need to reschedule your appointment if you arrive late (15 or more minutes).  Arriving late affects you and other patients whose appointments are after yours.  Also, if you miss three or more appointments without notifying the office, you may be dismissed from the clinic at the provider's discretion.      For prescription refill requests, have your pharmacy contact our office and allow 72 hours for refills to be completed.    Today you received the following chemotherapy and/or immunotherapy agents imfinzi.       To help prevent nausea and vomiting after your treatment, we encourage you to take your nausea medication as directed.  BELOW ARE SYMPTOMS THAT SHOULD BE REPORTED IMMEDIATELY: *FEVER GREATER THAN 100.4 F (38 C) OR HIGHER *CHILLS OR SWEATING *NAUSEA AND VOMITING THAT IS NOT CONTROLLED WITH YOUR NAUSEA MEDICATION *UNUSUAL SHORTNESS OF BREATH *UNUSUAL BRUISING OR BLEEDING *URINARY PROBLEMS (pain or burning when urinating, or frequent urination) *BOWEL PROBLEMS (unusual diarrhea, constipation, pain near the anus) TENDERNESS IN MOUTH AND THROAT WITH OR WITHOUT PRESENCE OF ULCERS (sore throat, sores in mouth, or a toothache) UNUSUAL RASH, SWELLING OR PAIN  UNUSUAL VAGINAL DISCHARGE OR ITCHING   Items with * indicate a potential emergency and should be followed up  as soon as possible or go to the Emergency Department if any problems should occur.  Please show the CHEMOTHERAPY ALERT CARD or IMMUNOTHERAPY ALERT CARD at check-in to the Emergency Department and triage nurse.  Should you have questions after your visit or need to cancel or reschedule your appointment, please contact [facility] [place] - A DEPT OF [facility] [phone]  and follow the prompts.  Office hours are 8:00 a.m. to 4:30 p.m. Monday - Friday. Please note that voicemails left after 4:00 p.m. may not be returned until the following business day.  We are closed weekends and major holidays. You have access to a nurse at all times for urgent questions. Please call the main number to the clinic [phone] and follow the prompts.  For any non-urgent questions, you may also contact your provider using MyChart. We now offer e-Visits for anyone 45 and older to request care online for non-urgent symptoms. For details visit [email or web address].   Also download the MyChart app! Go to the app store, search "MyChart", open the app, select [place], and log in with your MyChart username and password.

## 2024-05-25 NOTE — Assessment & Plan Note (Addendum)
 Patient has a history of hepatitis C that is not treated.  - Follow-up with Dr.[name]

## 2024-05-25 NOTE — Assessment & Plan Note (Addendum)
 Patient has a history of hepatocellular carcinoma initially diagnosed in 10/22.  Patient is not a candidate for liver transplant secondary to cirrhosis and hepatitis C. S/p TARE and Y90 in 03/14/2022 and 08/24/2022 respectively.   MRI from 11/27/2023 showed progression of disease Patient had chemo embolization on 12/18/2023 and had partial response and new lesions on recent MRI. CT chest with no evidence of metastasis but does have some stable small lesions AFP: 21 -03/24/2024: Started on Tremelimumab  and Durvalumab  per HIMALAYA trial. Held cycle 2 for a week for elevated AST  - Considering patient has multilobar disease unresponsive to local treatment, did not refer for radiation therapy because of the extent of disease. - Did not consider atezo/Bev because of the history of stroke -Labs reviewed today.  CMP: Normal creatinine, normal LFTs. CBC: Normal WBC and hemoglobin, platelets 136-improved from prior.   -AFP pending today.  Previous AFP showed slight improvement - Will repeat an MRI or CT CAP prior to fourth cycle to assess for response.   -Will repeat AFP with every cycle initially and then every 3 months after that -Physical exam stable today. Proceed with treatment today   Return to clinic for follow up prior to the third cycle

## 2024-05-25 NOTE — Assessment & Plan Note (Addendum)
 Likely immune therapy induced Improved with topical steroids  -Continue topical steroids as needed -Continue to monitor for now

## 2024-05-25 NOTE — Assessment & Plan Note (Addendum)
 Current use of half a pack per day, reduced from two packs per day. Previous discussions about smoking cessation.  - Encouraged attempts to cut down smoking

## 2024-05-26 ENCOUNTER — Other Ambulatory Visit: Payer: Self-pay

## 2024-05-26 LAB — T4: T4, Total: 7.8 ug/dL (ref 4.5–12.0)

## 2024-05-27 ENCOUNTER — Ambulatory Visit (INDEPENDENT_AMBULATORY_CARE_PROVIDER_SITE_OTHER): Admitting: Podiatry

## 2024-05-27 ENCOUNTER — Encounter: Payer: Self-pay | Admitting: Podiatry

## 2024-05-27 ENCOUNTER — Ambulatory Visit (INDEPENDENT_AMBULATORY_CARE_PROVIDER_SITE_OTHER)

## 2024-05-27 DIAGNOSIS — L97512 Non-pressure chronic ulcer of other part of right foot with fat layer exposed: Secondary | ICD-10-CM

## 2024-05-27 DIAGNOSIS — S90211A Contusion of right great toe with damage to nail, initial encounter: Secondary | ICD-10-CM | POA: Diagnosis not present

## 2024-05-27 LAB — AFP TUMOR MARKER: AFP, Serum, Tumor Marker: 26.1 ng/mL — ABNORMAL HIGH (ref 0.0–8.4)

## 2024-05-27 MED ORDER — AMOXICILLIN-POT CLAVULANATE 875-125 MG PO TABS: 1.0000 | ORAL_TABLET | Freq: Two times a day (BID) | ORAL | 0 refills | Status: DC

## 2024-05-27 MED ORDER — LEVOFLOXACIN 500 MG PO TABS
500.0000 mg | ORAL_TABLET | Freq: Every day | ORAL | 0 refills | Status: AC
Start: 2024-05-27 — End: 2024-06-03

## 2024-05-27 MED ORDER — MUPIROCIN 2 % EX OINT: 1.0000 | TOPICAL_OINTMENT | Freq: Two times a day (BID) | CUTANEOUS | 0 refills | Status: DC

## 2024-05-27 NOTE — Progress Notes (Signed)
 Subjective:  Patient ID: Danny [name], male    DOB: July 17, 1966,  MRN: [id number] HPI Chief Complaint  Patient presents with   Toe Injury    Hallux right - stumped toe 2 weeks ago, developed wound on plantar tip of toe, tender, tried to keep covered with bandaid and iodine , diabetic-7.3   New Patient (Initial Visit)    58 y.o. male presents with the above complaint.   ROS: Denies fever chills nausea vomit muscle aches pains calf pain back pain chest pain shortness of breath.  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 Past Medical History:  Diagnosis Date   Alcohol abuse    Anxiety    Back pain    Cancer (HCC)    Cirrhosis (HCC)    CVA (cerebral infarction)    Depression    Diabetes mellitus    Hepatitis C    Hypertension    Migraines    Stroke [facility])    Past Surgical History:  Procedure Laterality Date   ABDOMINAL AORTOGRAM W/LOWER EXTREMITY Right 05/06/2022   Procedure: ABDOMINAL AORTOGRAM W/LOWER EXTREMITY;  Surgeon: Sheree Penne Bruckner, MD;  Location: [facility] INVASIVE CV LAB;  Service: Cardiovascular;  Laterality: Right;   CAST APPLICATION Right 11/02/2021   Procedure: CAST APPLICATION;  Surgeon: Margrette Taft BRAVO, MD;  Location: [facility] ORS;  Service: Orthopedics;  Laterality: Right;   ESOPHAGOGASTRODUODENOSCOPY (EGD) WITH PROPOFOL  N/A 09/16/2022   Procedure: ESOPHAGOGASTRODUODENOSCOPY (EGD) WITH PROPOFOL ;  Surgeon: Cindie Carlin POUR, DO;  Location: [facility] ENDO SUITE;  Service: Endoscopy;  Laterality: N/A;  12:30pm, asa 3, pt knows to arrive at 6:45   FOOT SURGERY     INCISION AND DRAINAGE Right 11/02/2021   Procedure: INCISION AND DRAINAGE RIGHT KNEE;  Surgeon: Margrette Taft BRAVO, MD;  Location: [facility] ORS;  Service: Orthopedics;  Laterality: Right;   IR 3D INDEPENDENT WKST  10/11/2021   IR 3D INDEPENDENT WKST  02/28/2022   IR 3D INDEPENDENT WKST  03/14/2022   IR 3D INDEPENDENT WKST  12/18/2023   IR 3D INDEPENDENT WKST   12/18/2023   IR 3D INDEPENDENT WKST  12/18/2023   IR ANGIOGRAM SELECTIVE EACH ADDITIONAL VESSEL  10/11/2021   IR ANGIOGRAM SELECTIVE EACH ADDITIONAL VESSEL  10/11/2021   IR ANGIOGRAM SELECTIVE EACH ADDITIONAL VESSEL  10/11/2021   IR ANGIOGRAM SELECTIVE EACH ADDITIONAL VESSEL  10/11/2021   IR ANGIOGRAM SELECTIVE EACH ADDITIONAL VESSEL  10/11/2021   IR ANGIOGRAM SELECTIVE EACH ADDITIONAL VESSEL  10/11/2021   IR ANGIOGRAM SELECTIVE EACH ADDITIONAL VESSEL  10/25/2021   IR ANGIOGRAM SELECTIVE EACH ADDITIONAL VESSEL  10/25/2021   IR ANGIOGRAM SELECTIVE EACH ADDITIONAL VESSEL  02/28/2022   IR ANGIOGRAM SELECTIVE EACH ADDITIONAL VESSEL  02/28/2022   IR ANGIOGRAM SELECTIVE EACH ADDITIONAL VESSEL  02/28/2022   IR ANGIOGRAM SELECTIVE EACH ADDITIONAL VESSEL  03/14/2022   IR ANGIOGRAM SELECTIVE EACH ADDITIONAL VESSEL  12/18/2023   IR ANGIOGRAM SELECTIVE EACH ADDITIONAL VESSEL  12/18/2023   IR ANGIOGRAM VISCERAL SELECTIVE  10/11/2021   IR ANGIOGRAM VISCERAL SELECTIVE  02/28/2022   IR ANGIOGRAM VISCERAL SELECTIVE  03/14/2022   IR ANGIOGRAM VISCERAL SELECTIVE  12/18/2023   IR EMBO TUMOR ORGAN ISCHEMIA INFARCT INC GUIDE ROADMAPPING  10/25/2021   IR EMBO TUMOR ORGAN ISCHEMIA INFARCT INC GUIDE ROADMAPPING  10/25/2021   IR EMBO TUMOR ORGAN ISCHEMIA INFARCT INC GUIDE ROADMAPPING  03/14/2022   IR EMBO TUMOR ORGAN ISCHEMIA INFARCT INC GUIDE ROADMAPPING  12/18/2023   IR RADIOLOGIST EVAL & MGMT  08/23/2021   IR RADIOLOGIST EVAL & MGMT  11/21/2021   IR RADIOLOGIST EVAL & MGMT  01/24/2022   IR RADIOLOGIST EVAL & MGMT  04/09/2022   IR RADIOLOGIST EVAL & MGMT  08/05/2022   IR RADIOLOGIST EVAL & MGMT  11/29/2022   IR RADIOLOGIST EVAL & MGMT  06/11/2023   IR RADIOLOGIST EVAL & MGMT  11/17/2023   IR RADIOLOGIST EVAL & MGMT  02/06/2024   IR US  GUIDE VASC ACCESS RIGHT  10/11/2021   IR US  GUIDE VASC ACCESS RIGHT  10/25/2021   IR US  GUIDE VASC ACCESS RIGHT  02/28/2022   IR US  GUIDE VASC ACCESS RIGHT  03/14/2022   IR US  GUIDE VASC ACCESS RIGHT  12/18/2023    MANDIBLE SURGERY     PERIPHERAL VASCULAR INTERVENTION Right 05/06/2022   Procedure: PERIPHERAL VASCULAR INTERVENTION;  Surgeon: Sheree Penne Bruckner, MD;  Location: [facility] INVASIVE CV LAB;  Service: Cardiovascular;  Laterality: Right;  SFA   QUADRICEPS TENDON REPAIR Right 09/04/2021   Procedure: REPAIR QUADRICEP TENDON;  Surgeon: Margrette Taft BRAVO, MD;  Location: [facility];  Service: Orthopedics;  Laterality: Right;   TENDON EXPLORATION Right 11/02/2021   Procedure: QUADRICEPS TENDON AND PATELLAR TENDON EXPLORATION;  Surgeon: Margrette Taft BRAVO, MD;  Location: [facility] OR[facility];  Service: Orthopedics;  Laterality: Right;    Current Outpatient Medications:    amoxicillin -clavulanate (AUGMENTIN ) 875-125 MG tablet, Take 1 tablet by mouth 2 (two) times daily., Disp: 20 tablet, Rfl: 0   levofloxacin  (LEVAQUIN ) 500 MG tablet, Take 1 tablet (500 mg total) by mouth daily for 7 days., Disp: 7 tablet, Rfl: 0   mupirocin  ointment (BACTROBAN ) 2 %, Apply 1 Application topically 2 (two) times daily., Disp: 22 g, Rfl: 0   albuterol  (VENTOLIN  HFA) 108 (90 Base) MCG/ACT inhaler, Inhale 2 puffs into the lungs every 6 (six) hours as needed for wheezing or shortness of breath., Disp: , Rfl:    ALPRAZolam (XANAX) 0.25 MG tablet, Take 0.25 mg by mouth at bedtime., Disp: , Rfl:    Cholecalciferol  (VITAMIN D -3) 125 MCG (5000 UT) TABS, Take 5,000 Units by mouth daily., Disp: , Rfl:    citalopram  (CELEXA ) 20 MG tablet, Take 20 mg by mouth daily., Disp: , Rfl:    cyclobenzaprine  (FLEXERIL ) 5 MG tablet, Take 5 mg by mouth daily as needed for muscle spasms., Disp: , Rfl:    gabapentin  (NEURONTIN ) 400 MG capsule, Take 400 mg by mouth 2 (two) times daily as needed., Disp: , Rfl:    lidocaine -prilocaine  (EMLA ) cream, Apply to affected area once, Disp: 30 g, Rfl: 3   loperamide  (IMODIUM ) 2 MG capsule, Take 2 tablets by mouth after the first loose stool, then 1 tablet after each loose stool. Do not exceed 5 doses per day, Disp: 30 capsule,  Rfl: 1   Melatonin 10 MG CAPS, Take 10 mg by mouth at bedtime., Disp: , Rfl:    metFORMIN  (GLUCOPHAGE ) 1000 MG tablet, Take 1,000 mg by mouth 2 (two) times daily., Disp: , Rfl:    mirtazapine (REMERON) 7.5 MG tablet, Take 1 tablet every day by oral route for 90 days., Disp: , Rfl:    naloxone (NARCAN) nasal spray 4 mg/0.1 mL, Place 1 spray into the nose once., Disp: , Rfl:    ondansetron  (ZOFRAN ) 8 MG tablet, Take 1 tablet (8 mg total) by mouth every 8 (eight) hours as needed for nausea or vomiting., Disp: 30 tablet, Rfl: 1   oxyCODONE -acetaminophen  (PERCOCET) 10-325 MG tablet, Take 1 tablet by mouth every  4 (four) hours as needed for pain., Disp: 15 tablet, Rfl: 0   oxyCODONE -acetaminophen  (PERCOCET/ROXICET) 5-325 MG tablet, Take 1 tablet by mouth every 6 (six) hours as needed for severe pain (pain score 7-10)., Disp: 6 tablet, Rfl: 0   pantoprazole  (PROTONIX ) 40 MG tablet, Take 1 tablet (40 mg total) by mouth daily. (Patient not taking: Reported on 05/25/2024), Disp: 30 tablet, Rfl: 2   prochlorperazine  (COMPAZINE ) 10 MG tablet, Take 1 tablet (10 mg total) by mouth every 6 (six) hours as needed for nausea or vomiting., Disp: 30 tablet, Rfl: 1   QUEtiapine  (SEROQUEL ) 100 MG tablet, Take 100 mg by mouth at bedtime., Disp: , Rfl:    rosuvastatin  (CRESTOR ) 10 MG tablet, Take 1 tablet (10 mg total) by mouth daily. (Patient not taking: Reported on 05/25/2024), Disp: 30 tablet, Rfl: 11   Sofosbuvir-Velpatasvir 400-100 MG TABS, Take 1 tablet by mouth daily., Disp: , Rfl:    triamcinolone  cream (KENALOG ) 0.1 %, Apply 1 Application topically 2 (two) times daily., Disp: 60 g, Rfl: 2  Allergies  Allergen Reactions   Trazodone Other (See Comments)    Caused him to sleep walk   Benadryl  [Diphenhydramine ] Itching and Rash   Ultram  [Tramadol ] Hives   Review of Systems Objective:  There were no vitals filed for this visit.  General: Well developed, nourished, in no acute distress, alert and oriented x3    Dermatological: Skin is warm, dry and supple bilateral. Nails x 10 are well maintained; remaining integument appears unremarkable at this time. There are no open sores, no preulcerative lesions, no rash or signs of infection present.  Rigid mallet toe deformity right hallux with a distal ulcerative lesion measuring 1.4 cm in diameter.  There is some gray tissue and fibrin deposition overlying the area.  This is very sensitive to touch and for debridement.  There is some malodor present.  There does not appear to be a cellulitic process of the hallux at this point.  Vascular: Dorsalis Pedis artery and Posterior Tibial artery pedal pulses are 2/4 bilateral with immedate capillary fill time. Pedal hair growth present. No varicosities and no lower extremity edema present bilateral.   Neruologic: Grossly intact via light touch bilateral. Vibratory intact via tuning fork bilateral. Protective threshold with Semmes Wienstein monofilament intact to all pedal sites bilateral. Patellar and Achilles deep tendon reflexes 2+ bilateral. No Babinski or clonus noted bilateral.   Musculoskeletal: No gross boney pedal deformities bilateral. No pain, crepitus, or limitation noted with foot and ankle range of motion bilateral. Muscular strength 5/5 in all groups tested bilateral.  Gait: Unassisted, Nonantalgic.    Radiographs:  Radiographs taken today demonstrate osseously mature individual significant osteopenia.  There is significant osteolysis and breakdown of the bone at the distal tuft of the hallux right.  Hallux malleus is also noted.  Osteomyelitis is most likely the cause of this bone breakdown.  Assessment & Plan:   Assessment: Osteomyelitis and diabetic ulceration right hallux.  Plan: Discussed etiology pathology conservative surgical therapies I expressed to him that I was very concerned about his health as well as the ability to heal this ulcerative lesion while he is so sick otherwise.  He  understands this and is amenable to it.  We are going placed him on Augmentin  and Levaquin  for the next week to 10 days.  I am also going to recommend dressing the toe daily with a small amount of Bactroban  ointment and placing him in a wedge shoe.  I expressed to  him and his care provider that he is going need to stay off of this the majority of the time and the only time that he can be up he must have his wedge shoe on as well as walking with his walker.  I did provide him also with a buttress pad to help float the hallux.  He will follow-up with Dr. Silva in 2 weeks if not seen in the emergency department prior to that.  I did express to him signs and symptoms of worsening infection and that he should immediately go to the emergency department and we will have staff there to take care of him if necessary.     Danny [name], [place]

## 2024-05-30 ENCOUNTER — Other Ambulatory Visit: Payer: Self-pay

## 2024-06-10 ENCOUNTER — Ambulatory Visit (INDEPENDENT_AMBULATORY_CARE_PROVIDER_SITE_OTHER): Admitting: Podiatry

## 2024-06-10 VITALS — Ht 69.0 in | Wt 168.4 lb

## 2024-06-10 DIAGNOSIS — L97512 Non-pressure chronic ulcer of other part of right foot with fat layer exposed: Secondary | ICD-10-CM | POA: Diagnosis not present

## 2024-06-11 ENCOUNTER — Other Ambulatory Visit: Payer: Self-pay

## 2024-06-13 ENCOUNTER — Encounter: Payer: Self-pay | Admitting: Podiatry

## 2024-06-13 NOTE — Progress Notes (Signed)
 Subjective:  Patient ID: Danny [name], male    DOB: [birth year],  MRN: [id number]  Chief Complaint  Patient presents with   Foot Ulcer    RM 23 Patient is here for a pre-op visit for ulcer of the right hallux. Wound is open with moderate drainage.    58 y.o. male presents with the above complaint. History confirmed with patient.  They have been dressing with antibiotic ointment  Objective:  Physical Exam: warm, good capillary refill, normal DP and PT pulses, reduced sensation at tips of toes, and right hallux ulceration measuring 1.2 x 1.8 x 0.4 cm.  No active drainage.  No signs of infection.  There is surrounding hyperkeratosis.  Fibrogranular wound bed.  Does not probe to bone currently     Radiographs: Multiple views x-ray of the right foot: Prior radiographs show possible distal tuft erosion but no gaseous changes Assessment:   1. Ulcer of great toe, right, with fat layer exposed (HCC)      Plan:  Patient was evaluated and treated and all questions answered.  Ulcer right hallux -We discussed the etiology and factors that are a part of the wound healing process.  We also discussed the risk of infection both soft tissue and osteomyelitis from open ulceration.  Discussed the risk of limb loss if this happens or worsens. -Debridement as below. -Dressed with Prisma, DSD. -Continue home dressing changes 3 times weekly with 4 x 4 gauze and Prisma collagen -Continue off-loading with surgical shoe. -Vascular testing palpable pulses -HgbA1c:   -Last antibiotics: He has completed his oral antibiotics -Imaging: x-ray reviewed, shows some distal tuft changes.  Will see how this heals and if not improving may consider MRI.  Procedure: Excisional Debridement of Wound Tool: Sharp #312 chisel blade/tissue nipper Type of Debridement: Sharp Excisional Frequency: @Every  2 to 3 weeks until appropriately healed.  Dressing is to be changed daily/keeping the wound clean and dry Rationale:  Removal of non-viable soft tissue from the wound to promote healing.  Anesthesia: none Pre-Debridement Wound Measurements: 0.8 cm x 1.0 cm x 0.2 cm  Post-Debridement Wound Measurements: 1.2 cm x 1.0 cm x 0.4 cm  Area devitalized tissue removed(nonviable tissue only): 0.2 cm x 0.2 cm.  Blood loss: Minimal (<50cc) Hemostasis: manual pressure Depth of Debridement: with fat layer exposed Description of tissue removed: Slough and Devitalized Tissue Technique: The wound and the surrounding skin were prepped in usual aseptic fashion.  Aseptic technique was maintained throughout the procedure.  Using #312 blade/tissue nipper sharp debridement of necrotic/nonviable tissue was performed until healthy bleeding wound bed was achieved.  No underlying bone or tendon was exposed during debridement.  The wound was thoroughly irrigated with normal saline solution Wound Progress:  Current Wound Volume: Debridement was performed of the chronic nonhealing diabetic foot wound on right foot hallux.  Debridement removed 0.2 cm x 0.2 cm of the necrotic tissue and subcutaneous tissue and minimal amount of   drainage was not present. Presence/absence of tissue: Necrotic tissue/nonviable tissue present at the base of the wound.  Sharp debridement was performed to remove the necrotic tissue/nonviable tissue back to viable tissue.  No devitalized/nonviable tissue present postdebridement.  Wound appeared clean and clear of infection No material in the wound was present that was identified to be inhibiting healing. Dressing: Dry, sterile, dressing with Prisma collagen on ulcer base followed by gauze and Coban. Disposition: Patient tolerated procedure well. Patient to return for ongoing wound care as noted below.  No follow-ups on  file.         No follow-ups on file.

## 2024-06-15 ENCOUNTER — Ambulatory Visit (HOSPITAL_COMMUNITY)
Admission: RE | Admit: 2024-06-15 | Discharge: 2024-06-15 | Disposition: A | Discharge status: Home / Self Care | Source: Ambulatory Visit | Attending: Oncology | Admitting: Oncology

## 2024-06-15 DIAGNOSIS — C220 Liver cell carcinoma: Secondary | ICD-10-CM | POA: Diagnosis present

## 2024-06-15 MED ORDER — IOHEXOL 300 MG/ML  SOLN
100.0000 mL | Freq: Once | INTRAMUSCULAR | Status: AC | PRN
  Administered 2024-06-15: 100 mL via INTRAVENOUS

## 2024-06-16 ENCOUNTER — Other Ambulatory Visit: Payer: Self-pay | Admitting: Oncology

## 2024-06-16 DIAGNOSIS — C220 Liver cell carcinoma: Secondary | ICD-10-CM

## 2024-06-17 ENCOUNTER — Other Ambulatory Visit: Payer: Self-pay

## 2024-06-17 ENCOUNTER — Other Ambulatory Visit (HOSPITAL_COMMUNITY)

## 2024-06-18 ENCOUNTER — Other Ambulatory Visit: Payer: Self-pay

## 2024-06-22 ENCOUNTER — Other Ambulatory Visit: Payer: Self-pay | Admitting: Interventional Radiology

## 2024-06-22 DIAGNOSIS — C220 Liver cell carcinoma: Secondary | ICD-10-CM

## 2024-06-23 ENCOUNTER — Inpatient Hospital Stay: Attending: Oncology

## 2024-06-23 ENCOUNTER — Inpatient Hospital Stay

## 2024-06-23 ENCOUNTER — Inpatient Hospital Stay (HOSPITAL_BASED_OUTPATIENT_CLINIC_OR_DEPARTMENT_OTHER): Admitting: Oncology

## 2024-06-23 VITALS — BP 132/68 | HR 73 | Temp 98.4°F | Resp 18 | Ht 69.0 in | Wt 167.0 lb

## 2024-06-23 VITALS — BP 125/60 | HR 70 | Temp 96.5°F | Resp 18

## 2024-06-23 DIAGNOSIS — R748 Abnormal levels of other serum enzymes: Secondary | ICD-10-CM

## 2024-06-23 DIAGNOSIS — Z79620 Long term (current) use of immunosuppressive biologic: Secondary | ICD-10-CM | POA: Insufficient documentation

## 2024-06-23 DIAGNOSIS — F17210 Nicotine dependence, cigarettes, uncomplicated: Secondary | ICD-10-CM | POA: Diagnosis not present

## 2024-06-23 DIAGNOSIS — Z5112 Encounter for antineoplastic immunotherapy: Secondary | ICD-10-CM | POA: Insufficient documentation

## 2024-06-23 DIAGNOSIS — C220 Liver cell carcinoma: Secondary | ICD-10-CM | POA: Insufficient documentation

## 2024-06-23 DIAGNOSIS — B1920 Unspecified viral hepatitis C without hepatic coma: Secondary | ICD-10-CM | POA: Diagnosis not present

## 2024-06-23 DIAGNOSIS — R21 Rash and other nonspecific skin eruption: Secondary | ICD-10-CM

## 2024-06-23 DIAGNOSIS — Z720 Tobacco use: Secondary | ICD-10-CM

## 2024-06-23 DIAGNOSIS — D696 Thrombocytopenia, unspecified: Secondary | ICD-10-CM

## 2024-06-23 DIAGNOSIS — R52 Pain, unspecified: Secondary | ICD-10-CM

## 2024-06-23 DIAGNOSIS — R197 Diarrhea, unspecified: Secondary | ICD-10-CM

## 2024-06-23 DIAGNOSIS — S91101A Unspecified open wound of right great toe without damage to nail, initial encounter: Secondary | ICD-10-CM

## 2024-06-23 LAB — CBC WITH DIFFERENTIAL/PLATELET
Abs Immature Granulocytes: 0.01 K/uL (ref 0.00–0.07)
Basophils Absolute: 0 K/uL (ref 0.0–0.1)
Basophils Relative: 1 %
Eosinophils Absolute: 0.3 K/uL (ref 0.0–0.5)
Eosinophils Relative: 8 %
HCT: 38.4 % — ABNORMAL LOW (ref 39.0–52.0)
Hemoglobin: 13 g/dL (ref 13.0–17.0)
Immature Granulocytes: 0 %
Lymphocytes Relative: 24 %
Lymphs Abs: 0.9 K/uL (ref 0.7–4.0)
MCH: 31 pg (ref 26.0–34.0)
MCHC: 33.9 g/dL (ref 30.0–36.0)
MCV: 91.6 fL (ref 80.0–100.0)
Monocytes Absolute: 0.3 K/uL (ref 0.1–1.0)
Monocytes Relative: 9 %
Neutro Abs: 2.3 K/uL (ref 1.7–7.7)
Neutrophils Relative %: 58 %
Platelets: 93 K/uL — ABNORMAL LOW (ref 150–400)
RBC: 4.19 MIL/uL — ABNORMAL LOW (ref 4.22–5.81)
RDW: 13.2 % (ref 11.5–15.5)
WBC: 4 K/uL (ref 4.0–10.5)
nRBC: 0 % (ref 0.0–0.2)

## 2024-06-23 LAB — COMPREHENSIVE METABOLIC PANEL WITH GFR
ALT: 17 U/L (ref 0–44)
AST: 33 U/L (ref 15–41)
Albumin: 3.8 g/dL (ref 3.5–5.0)
Alkaline Phosphatase: 132 U/L — ABNORMAL HIGH (ref 38–126)
Anion gap: 12 (ref 5–15)
BUN: 9 mg/dL (ref 6–20)
CO2: 24 mmol/L (ref 22–32)
Calcium: 9.5 mg/dL (ref 8.9–10.3)
Chloride: 101 mmol/L (ref 98–111)
Creatinine, Ser: 1.02 mg/dL (ref 0.61–1.24)
GFR, Estimated: >60 mL/min (ref >60)
Glucose, Bld: 149 mg/dL — ABNORMAL HIGH (ref 70–99)
Potassium: 3.7 mmol/L (ref 3.5–5.1)
Sodium: 137 mmol/L (ref 135–145)
Total Bilirubin: 0.6 mg/dL (ref 0.0–1.2)
Total Protein: 7.2 g/dL (ref 6.5–8.1)

## 2024-06-23 MED ORDER — SODIUM CHLORIDE 0.9 % IV SOLN: INTRAVENOUS | Status: DC

## 2024-06-23 MED ORDER — SODIUM CHLORIDE 0.9 % IV SOLN
1500.0000 mg | Freq: Once | INTRAVENOUS | Status: AC
  Administered 2024-06-23: 1500 mg via INTRAVENOUS
  Filled 2024-06-23: qty 30

## 2024-06-23 NOTE — Patient Instructions (Signed)
 [facility] - A DEPT OF [facility]  Discharge Instructions: Thank you for choosing [facility] to provide your oncology and hematology care.  If you have a lab appointment with the Cancer Center - please note that after April 8th, 2024, all labs will be drawn in the cancer center.  You do not have to check in or register with the main entrance as you have in the past but will complete your check-in in the cancer center.  Wear comfortable clothing and clothing appropriate for easy access to any Portacath or PICC line.   We strive to give you quality time with your provider. You may need to reschedule your appointment if you arrive late (15 or more minutes).  Arriving late affects you and other patients whose appointments are after yours.  Also, if you miss three or more appointments without notifying the office, you may be dismissed from the clinic at the provider's discretion.      For prescription refill requests, have your pharmacy contact our office and allow 72 hours for refills to be completed.    Today you received the following chemotherapy and/or immunotherapy agents Imfinzi      To help prevent nausea and vomiting after your treatment, we encourage you to take your nausea medication as directed.  BELOW ARE SYMPTOMS THAT SHOULD BE REPORTED IMMEDIATELY: *FEVER GREATER THAN 100.4 F (38 C) OR HIGHER *CHILLS OR SWEATING *NAUSEA AND VOMITING THAT IS NOT CONTROLLED WITH YOUR NAUSEA MEDICATION *UNUSUAL SHORTNESS OF BREATH *UNUSUAL BRUISING OR BLEEDING *URINARY PROBLEMS (pain or burning when urinating, or frequent urination) *BOWEL PROBLEMS (unusual diarrhea, constipation, pain near the anus) TENDERNESS IN MOUTH AND THROAT WITH OR WITHOUT PRESENCE OF ULCERS (sore throat, sores in mouth, or a toothache) UNUSUAL RASH, SWELLING OR PAIN  UNUSUAL VAGINAL DISCHARGE OR ITCHING   Items with * indicate a potential emergency and should be followed up  as soon as possible or go to the Emergency Department if any problems should occur.  Please show the CHEMOTHERAPY ALERT CARD or IMMUNOTHERAPY ALERT CARD at check-in to the Emergency Department and triage nurse.  Should you have questions after your visit or need to cancel or reschedule your appointment, please contact [facility] [place] - A DEPT OF [facility] [phone]  and follow the prompts.  Office hours are 8:00 a.m. to 4:30 p.m. Monday - Friday. Please note that voicemails left after 4:00 p.m. may not be returned until the following business day.  We are closed weekends and major holidays. You have access to a nurse at all times for urgent questions. Please call the main number to the clinic [phone] and follow the prompts.  For any non-urgent questions, you may also contact your provider using MyChart. We now offer e-Visits for anyone 42 and older to request care online for non-urgent symptoms. For details visit [email or web address].   Also download the MyChart app! Go to the app store, search "MyChart", open the app, select [place], and log in with your MyChart username and password.

## 2024-06-23 NOTE — Progress Notes (Addendum)
 Patient Care Team: Vick Lurie, FNP (Inactive) as PCP - General (Family Medicine) Cindie Carlin POUR, DO as Consulting Physician (Gastroenterology) Davonna Siad, MD as Medical Oncologist (Medical Oncology) Celestia Joesph SQUIBB, RN as Oncology Nurse Navigator (Medical Oncology)  Clinic Day:  06/23/2024  Referring physician: Vick Lurie, FNP   CHIEF COMPLAINT:  CC: Hepatocellular carcinoma   ASSESSMENT & PLAN:   Assessment & Plan: Danny [name]  is a 58 y.o. male with hepatocellular carcinoma  Hepatocellular carcinoma Patient has a history of hepatocellular carcinoma initially diagnosed in 10/22.  Patient is not a candidate for liver transplant secondary to cirrhosis and hepatitis C. S/p TARE and Y90 in 03/14/2022 and 08/24/2022 respectively.   MRI from 11/27/2023 showed progression of disease Patient had chemo embolization on 12/18/2023 and had partial response and new lesions on recent MRI. CT chest with no evidence of metastasis but does have some stable small lesions AFP: 21 -03/24/2024: Started on Tremelimumab  and Durvalumab  per HIMALAYA trial. Held cycle 2 for a week for elevated AST and restarted after that.   - Considering patient has multilobar disease unresponsive to local treatment, did not refer for radiation therapy because of the extent of disease. - Did not consider atezo/Bev because of the history of stroke -We reviewed the CT CAP together today showed stable disease with no evidence of new disease. -Labs reviewed today.  CMP: Normal creatinine, normal LFTs. CBC: Normal WBC and hemoglobin, platelets 93.    -AFP pending today.  Previous AFP trending up - Will repeat an MRI or CT CAP in 3 months -Will repeat AFP with every cycle initially and then every 3 months after that -Physical exam stable today. Proceed with treatment today  -Patient has an appointment with Dr. Barbie) coming up.  He has an MRI abdomen scheduled for the same.   Return to clinic  for follow up prior to next cycle  Rash On face and arms.  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 Previous discussions about smoking cessation.   - Encouraged attempts to cut down smoking  Hepatitis C infection Patient has a history of hepatitis C that is not treated.   - Follow-up with Dr.[name]  Right great toe open wound Has recently seen podiatry and treated with antibiotics and has significantly improved.  Diarrhea Significantly improved. Use Imodium  as needed  Pain management Gets pain medications for chronic pain pain clinic.   - Recommended patient to change from Percocet to oxycodone  to avoid Tylenol   Elevated liver enzymes Likely secondary to immunotherapy.  Resolved at this time.   The patient understands the plans discussed today and is in agreement with them.  He knows to contact our office if he develops concerns prior to his next appointment.  The total time spent in the appointment was 30 minutes for the encounter  with patient, including review of chart and various tests results, discussions about plan of care and coordination of care plan   Siad Davonna, MD  [facility] [place] - A DEPT OF [facility] [address] [place] [address] Dept: [phone] Dept Fax: [phone]   Orders Placed This Encounter  Procedures   AFP tumor  marker     ONCOLOGY HISTORY:   Diagnosis: Multifocal hepatocellular carcinoma:  -06/29/2021: MRI Liver with and without contrast : Arterially hyperenhancing mass of the central posterior left lobe of the liver, hepatic segment II/III, measuring 2.7 x 2.1 cm, with some  evidence of washout and capsular enhancement. LI-RADS category 5, consistent with hepatocellular carcinoma. Additional subtle arterially hyperenhancing lesion of the central liver dome, hepatic segment VIII, measuring 1.1 cm, without evidence of washout or capsule. LI-RADS category 3, intermediate suspicion for hepatocellular carcinoma. Attention on follow-up. -10/25/2021: Left hepatic transarterial radioembolization  -01/18/2022: MRI Abdomen with and without contrast: Decreased size and lack of internal enhancement of treated mass in segment 3 of the left lobe. (LR-TR Nonviable). Increased size of 15 mm hypervascular lesion in the liver dome. (LI-RADS Category 5: Definitely HCC). 9 mm hypervascular lesion near the junction of the right and left lobes, not seen on prior exam. (LI-RADS Category 3: Intermediate probability of malignancy). No evidence of abdominal metastatic disease. Stable splenomegaly and portosystemic collaterals, consistent with portal venous hypertension. -03/14/2022: Segment VIII radiation segmentectomy- Right Y90 embolization -07/18/2022: MRI with and without contrast: Low-level, diffuse hyperenhancement of the left lobe of the liver as well as hepatic segment VIII with associated atrophy, but without suspicious focal hyperenhancement. Findings are consistent treated multifocal hepatocellular carcinoma without evidence of residual viable disease. LI-RADS TR, nonviable. No new liver lesions. No evidence of lymphadenopathy or metastatic disease in the abdomen. Splenomegaly and large splenic varices. -11/27/2022: MRI abdomen:Post-TARE changes in left lobe and segment VIII, no focal enhancing lesions - LIRADS TR-non viable.  -05/23/2023: MRI Abdomen with and without contrast: Post-TARE changes in left lobe and segment VIII. New segment IV 1.7 cm enhancing mass with washout, previously 1.5 cm = LIRADS 5.  -05/23/2023: Tumor marker : AFP: 7.0 -11/12/2023: MRI Abdomen with and without contrast:  Redemonstration of multiple liver lesions, as described above. There is a 1.2 x 1.4 cm LI-RADS 5 lesion in the left hepatic lobe, segment 4B, which is slightly decreased in size since the prior study. There are 2, LI-RADS 5 lesions in the right hepatic dome, which are slightly increased in size since the prior study. There are 3, LI-RADS 4 lesions in the right hepatic lobe, segments 6/7, which are grossly stable. There is posttreatment cavity in the right hepatic dome, at the junctions of segment 8 and 4A a measuring approximately 2.1 x 2.2 cm. The cavity exhibits peripheral irregular enhancing wall which exhibits washout on delayed/equilibrium phase images, favoring viable tumor. No new liver lesion seen. -12/18/2023: Transarterial chemoembolization of right lobe and segment IV.  -01/29/2024: CT Abdomen and Pelvis with contrast: Suspected multifocal HCC, including: 2.4 cm rim-enhancing lesion at the junction of segments (series 6/image 12), previously 2.2 cm . 1.6 cm enhancing lesion medially in segment 7 (series 6/image 7), previously 1.4 cm .1.6 cm vague enhancing lesion in segment 4b (series 6/image 29), previously 1.4 cm. Additionally, there is a 2.6 cm irregular enhancing lesion in segment 2 (series 6 / image 14) obscured on prior MR, favoring an additional lesion. Suspected multifocal HCC, favored to be overall similar to prior MR, noting differences in technique. Cirrhosis with moderate splenomegaly. -03/10/2024: CT Chest with contrast: No significant interval change. Few scattered tiny lung nodules identified under 5 mm are similar to previous. No new dominant lung nodule. -Patient not a candidate for local therapy because of multifocal HCC. -03/24/2024 - current: Tremelimumab  + Durvalumab  (STRIDE regimen) -06/15/2024: CT CAP: Multiple liver lesions are again seen,  most clearly visible lesion in the right anterior liver dome, hepatic segment 8, appears to have slightly enlarged when compared to prior  exam.  Lesion in the posterior liver dome, hepatic segment 7, appears similar.  No obvious new lesions.  No lymphadenopathy or metastatic disease in chest, abdomen, pelvis.   Current Treatment: Tremelimumab  plus durvalumab   INTERVAL HISTORY:  BASTIEN STRAWSER is here today for follow up. Patient is accompanied by his niece today.    Sarvesh's rash has improved after using the triamcinolone  cream that was prescribed to him during the previous visit.  His great toe infection has also been improving after keeping it clean and applying the prescribed antibiotic cream.   Kalyb is an active smoker, but his niece says that he is slowing down and cutting the amount of cigarettes per day.  I have reviewed the past medical history, past surgical history, social history and family history with the patient and they are unchanged from previous note.  ALLERGIES:  is allergic to trazodone, benadryl  [diphenhydramine ], and ultram  [tramadol ].  MEDICATIONS:  Current Outpatient Medications  Medication Sig Dispense Refill   albuterol  (VENTOLIN  HFA) 108 (90 Base) MCG/ACT inhaler Inhale 2 puffs into the lungs every 6 (six) hours as needed for wheezing or shortness of breath.     ALPRAZolam (XANAX) 0.25 MG tablet Take 0.25 mg by mouth at bedtime.     Cholecalciferol  (VITAMIN D -3) 125 MCG (5000 UT) TABS Take 5,000 Units by mouth daily.     citalopram  (CELEXA ) 20 MG tablet Take 20 mg by mouth daily.     cyclobenzaprine  (FLEXERIL ) 5 MG tablet Take 5 mg by mouth daily as needed for muscle spasms.     gabapentin  (NEURONTIN ) 400 MG capsule Take 400 mg by mouth 2 (two) times daily as needed.     lidocaine -prilocaine  (EMLA ) cream Apply to affected area once 30 g 3   loperamide  (IMODIUM ) 2 MG capsule Take 2 tablets by mouth after the first loose stool, then 1 tablet after each loose stool. Do not exceed 5 doses per day 30 capsule 1   Melatonin 10 MG CAPS Take 10 mg by mouth at bedtime.     metFORMIN  (GLUCOPHAGE ) 1000  MG tablet Take 1,000 mg by mouth 2 (two) times daily.     mirtazapine (REMERON) 7.5 MG tablet Take 1 tablet every day by oral route for 90 days.     mupirocin  ointment (BACTROBAN ) 2 % Apply 1 Application topically 2 (two) times daily. 22 g 0   naloxone (NARCAN) nasal spray 4 mg/0.1 mL Place 1 spray into the nose once.     ondansetron  (ZOFRAN ) 8 MG tablet Take 1 tablet (8 mg total) by mouth every 8 (eight) hours as needed for nausea or vomiting. 30 tablet 1   oxyCODONE -acetaminophen  (PERCOCET) 10-325 MG tablet Take 1 tablet by mouth every 4 (four) hours as needed for pain. 15 tablet 0   oxyCODONE -acetaminophen  (PERCOCET/ROXICET) 5-325 MG tablet Take 1 tablet by mouth every 6 (six) hours as needed for severe pain (pain score 7-10). 6 tablet 0   pantoprazole  (PROTONIX ) 40 MG tablet Take 1 tablet (40 mg total) by mouth daily. 30 tablet 2   prochlorperazine  (COMPAZINE ) 10 MG tablet Take 1 tablet (10 mg total) by mouth every 6 (six) hours as needed for nausea or vomiting. 30 tablet 1   QUEtiapine  (SEROQUEL ) 100 MG tablet Take 100 mg by mouth at bedtime.     rosuvastatin  (CRESTOR ) 10 MG tablet Take 1 tablet (10 mg  total) by mouth daily. 30 tablet 11   Sofosbuvir-Velpatasvir 400-100 MG TABS Take 1 tablet by mouth daily.     triamcinolone  cream (KENALOG ) 0.1 % Apply 1 Application topically 2 (two) times daily. 60 g 2   amoxicillin -clavulanate (AUGMENTIN ) 875-125 MG tablet Take 1 tablet by mouth 2 (two) times daily. 20 tablet 0   No current facility-administered medications for this visit.    REVIEW OF SYSTEMS:   Constitutional: Denies fevers, chills or abnormal weight loss Eyes: Denies blurriness of vision Ears, nose, mouth, throat, and face: Denies mucositis or sore throat Respiratory: Denies cough, dyspnea or wheezes Cardiovascular: Denies palpitation, chest discomfort or lower extremity swelling Gastrointestinal:  Denies nausea, heartburn or change in bowel habits Skin: Denies abnormal skin  rashes Lymphatics: Denies new lymphadenopathy or easy bruising Neurological:Denies numbness, tingling or new weaknesses Behavioral/Psych: Mood is stable, no new changes  All other systems were reviewed with the patient and are negative.   VITALS:  Blood pressure 132/68, pulse 73, temperature 98.4 F (36.9 C), temperature source Tympanic, resp. rate 18, height 5' 9 (1.753 m), weight 167 lb (75.8 kg), SpO2 100%.  Wt Readings from Last 3 Encounters:  06/23/24 167 lb (75.8 kg)  06/10/24 168 lb 6.4 oz (76.4 kg)  05/25/24 168 lb 6.4 oz (76.4 kg)    Body mass index is 24.66 kg/m.  Performance status (ECOG): 2 - Symptomatic, <50% confined to bed  PHYSICAL EXAM:   GENERAL:alert, no distress and comfortable SKIN: Erythematous rash on his chin, necrotic wound on his right great toe LUNGS: clear to auscultation and percussion with normal breathing effort HEART: regular rate & rhythm and no murmurs and no lower extremity edema ABDOMEN:abdomen soft, non-tender and normal bowel sounds Musculoskeletal:no cyanosis of digits and no clubbing  NEURO: alert & oriented x 3 with fluent speech  LABORATORY DATA:  I have reviewed the data as listed    Component Value Date/Time   NA 137 06/23/2024 1203   K 3.7 06/23/2024 1203   CL 101 06/23/2024 1203   CO2 24 06/23/2024 1203   GLUCOSE 149 (H) 06/23/2024 1203   BUN 9 06/23/2024 1203   CREATININE 1.02 06/23/2024 1203   CREATININE 0.83 06/11/2021 0907   CALCIUM  9.5 06/23/2024 1203   PROT 7.2 06/23/2024 1203   ALBUMIN  3.8 06/23/2024 1203   AST 33 06/23/2024 1203   ALT 17 06/23/2024 1203   ALKPHOS 132 (H) 06/23/2024 1203   BILITOT 0.6 06/23/2024 1203   GFRNONAA >60 06/23/2024 1203   GFRAA >60 05/24/2019 1858   Lab Results  Component Value Date   WBC 4.0 06/23/2024   NEUTROABS 2.3 06/23/2024   HGB 13.0 06/23/2024   HCT 38.4 (L) 06/23/2024   MCV 91.6 06/23/2024   PLT 93 (L) 06/23/2024      Chemistry      Component Value Date/Time   NA  137 06/23/2024 1203   K 3.7 06/23/2024 1203   CL 101 06/23/2024 1203   CO2 24 06/23/2024 1203   BUN 9 06/23/2024 1203   CREATININE 1.02 06/23/2024 1203   CREATININE 0.83 06/11/2021 0907      Component Value Date/Time   CALCIUM  9.5 06/23/2024 1203   ALKPHOS 132 (H) 06/23/2024 1203   AST 33 06/23/2024 1203   ALT 17 06/23/2024 1203   BILITOT 0.6 06/23/2024 1203      Latest Reference Range & Units 05/25/24 09:00  AFP, Serum, Tumor Marker 0.0 - 8.4 ng/mL 26.1 (H)  (H): Data is abnormally high  Latest Reference Range & Units 05/25/24 09:00  TSH 0.350 - 4.500 uIU/mL 3.387  Thyroxine (T4) 4.5 - 12.0 ug/dL 7.8   RADIOGRAPHIC STUDIES: I have personally reviewed the radiological images as listed and agreed with the findings in the report.  CT CHEST ABDOMEN PELVIS W CONTRAST CLINICAL DATA:  Hepatocellular carcinoma restaging * Tracking Code: BO *  EXAM: CT CHEST, ABDOMEN, AND PELVIS WITH CONTRAST  TECHNIQUE: Multidetector CT imaging of the chest, abdomen and pelvis was performed following the standard protocol during bolus administration of intravenous contrast.  RADIATION DOSE REDUCTION: This exam was performed according to the departmental dose-optimization program which includes automated exposure control, adjustment of the mA and/or kV according to patient size and/or use of iterative reconstruction technique.  CONTRAST:  OMNIPAQUE  IOHEXOL  300 MG/ML  SOLN  COMPARISON:  CT chest, 03/10/2024, CT abdomen, 01/29/2024  FINDINGS: CT CHEST FINDINGS  Cardiovascular: Aortic atherosclerosis. Normal heart size. Three-vessel coronary artery calcifications. No pericardial effusion.  Mediastinum/Nodes: No enlarged mediastinal, hilar, or axillary lymph nodes. Thyroid  gland, trachea, and esophagus demonstrate no significant findings.  Lungs/Pleura: Mild centrilobular and paraseptal emphysema. Diffuse bilateral bronchial wall thickening. Background of fine centrilobular  nodularity most concentrated in the lung apices. No pleural effusion or pneumothorax.  Musculoskeletal: No chest wall abnormality. No acute osseous findings.  CT ABDOMEN PELVIS FINDINGS  Hepatobiliary: Coarse contour of the liver. Multiple liver lesions are again seen, not optimally assessed on this single phase portal venous examination. Most clearly visible lesion in the anterior liver dome, hepatic segment VIII, appears to have slightly enlarged when compared to prior examination dated 01/29/2024, on today's examination measuring 3.2 x 2.6 cm, previously 2.5 x 2.4 cm when measured with similar technique (series 3, image 50). Redemonstrated heterogeneously hyperenhancing atrophy of the left lobe of the liver, lesion anteriorly difficult to distinctly visualized (series 3, image 55) lesion in the posterior liver dome, hepatic segment VII, appears similar, measuring 1.5 x 1.3 cm (series 3, image 48). No obvious new lesions. No gallstones, gallbladder wall thickening, or biliary dilatation.  Pancreas: Unremarkable. No pancreatic ductal dilatation or surrounding inflammatory changes.  Spleen: Splenomegaly, maximum coronal span 19.0 cm.  Adrenals/Urinary Tract: Adrenal glands are unremarkable. Kidneys are normal, without renal calculi, solid lesion, or hydronephrosis. Bladder is unremarkable.  Stomach/Bowel: Stomach is within normal limits. Appendix appears normal. No evidence of bowel wall thickening, distention, or inflammatory changes.  Vascular/Lymphatic: Aortic atherosclerosis. Large splenic varices in the left upper quadrant (series 3, image 57). No enlarged abdominal or pelvic lymph nodes.  Reproductive: No mass or other abnormality.  Other: No abdominal wall hernia or abnormality. No ascites.  Musculoskeletal: No acute osseous findings.  IMPRESSION: 1. Multiple liver lesions are again seen, not optimally assessed on this single phase portal venous examination. Most  clearly visible lesion in the anterior liver dome, hepatic segment VIII, appears to have slightly enlarged when compared to prior examination dated 01/29/2024. Redemonstrated heterogeneously hyperenhancing atrophy of the left lobe of the liver, lesion anteriorly difficult to distinctly visualized. Lesion in the posterior liver dome, hepatic segment VII, appears similar. No obvious new lesions. Multiphasic contrast enhanced MRI or CT are the test of choice for the assessment of hepatocellular carcinoma. 2. No evidence of lymphadenopathy or metastatic disease in the chest, abdomen, or pelvis. 3. Cirrhosis and splenomegaly. Large splenic varices in the left upper quadrant. 4. Emphysema and smoking-related respiratory bronchiolitis. 5. Coronary artery disease.  Aortic Atherosclerosis (ICD10-I70.0) and Emphysema (ICD10-J43.9).  Electronically Signed   By: Alex D  Marlyce M.D.   On: 06/18/2024 07:12

## 2024-06-23 NOTE — Patient Instructions (Addendum)
 [facility] Discharge Instructions   You were seen and examined today by Dr. Davonna.  She reviewed the results of your lab work which are normal/stable.   She reviewed the results of your CT scan which shows the cancer is stable.   We will proceed with your treatment today.   Return as scheduled.    Thank you for choosing  [facility] Center at [facility] to provide your oncology and hematology care.  To afford each patient quality time with our provider, please arrive at least 15 minutes before your scheduled appointment time.   If you have a lab appointment with the Cancer Center please come in thru the Main Entrance and check in at the main information desk.  You need to re-schedule your appointment should you arrive 10 or more minutes late.  We strive to give you quality time with our providers, and arriving late affects you and other patients whose appointments are after yours.  Also, if you no show three or more times for appointments you may be dismissed from the clinic at the providers discretion.     Again, thank you for choosing [facility].  Our hope is that these requests will decrease the amount of time that you wait before being seen by our physicians.       _____________________________________________________________  Should you have questions after your visit to [facility], please contact our office at [phone] and follow the prompts.  Our office hours are 8:00 a.m. and 4:30 p.m. Monday - Friday.  Please note that voicemails left after 4:00 p.m. may not be returned until the following business day.  We are closed weekends and major holidays.  You do have access to a nurse 24-7, just call the main number to the clinic [phone] and do not press any options, hold on the line and a nurse will answer the phone.    For prescription refill requests, have your pharmacy contact our office and  allow 72 hours.    Due to Covid, you will need to wear a mask upon entering the hospital. If you do not have a mask, a mask will be given to you at the Main Entrance upon arrival. For doctor visits, patients may have 1 support person age 92 or older with them. For treatment visits, patients can not have anyone with them due to social distancing guidelines and our immunocompromised population.

## 2024-06-23 NOTE — Progress Notes (Signed)
 Patient presents today for Imfinzi  infusion per providers order.  Vital signs and labs reviewed by MD.  Message received from Isaiah Piety RN/Dr. Davonna patient okay for treatment.   Peripheral IV started and blood return noted pre and post infusion.  Treatment given today per MD orders.  Stable during infusion without adverse affects.  Vital signs stable.  No complaints at this time.  Discharge from clinic via wheelchair in stable condition.  Alert and oriented X 3.  Follow up with [facility] as scheduled.

## 2024-06-23 NOTE — Progress Notes (Signed)
 Ok to treat with Platelets 93K  V.O. Dr Ivery Molt, PharmD

## 2024-06-23 NOTE — Progress Notes (Signed)
 Patient has been examined by Dr. Davonna. Vital signs and labs have been reviewed by MD - ANC, Creatinine, LFTs, hemoglobin, and platelets have been reviewed by M.D. - pt may proceed with treatment.  Primary RN and pharmacy notified.

## 2024-06-24 ENCOUNTER — Other Ambulatory Visit: Payer: Self-pay

## 2024-06-24 LAB — AFP TUMOR MARKER: AFP, Serum, Tumor Marker: 28.6 ng/mL — ABNORMAL HIGH (ref 0.0–8.4)

## 2024-06-25 ENCOUNTER — Other Ambulatory Visit: Payer: Self-pay

## 2024-06-30 ENCOUNTER — Ambulatory Visit (HOSPITAL_COMMUNITY)
Admission: RE | Admit: 2024-06-30 | Discharge: 2024-06-30 | Disposition: A | Discharge status: Home / Self Care | Source: Ambulatory Visit | Attending: Interventional Radiology | Admitting: Interventional Radiology

## 2024-06-30 DIAGNOSIS — C220 Liver cell carcinoma: Secondary | ICD-10-CM | POA: Diagnosis present

## 2024-06-30 MED ORDER — GADOBUTROL 1 MMOL/ML IV SOLN
7.0000 mL | Freq: Once | INTRAVENOUS | Status: AC | PRN
  Administered 2024-06-30: 7 mL via INTRAVENOUS

## 2024-07-04 NOTE — Progress Notes (Shared)
 This encounter was conducted via the [employer] providing interactive audio and visual communication.  The patient provided verbal consent to conduct a virtual appointment.  The patient was located at their primary residence during this encounter.  Referring Physician(s): Stephane Quest, NP, Carlin Hasty, DO   Chief Complaint: The patient is seen in virtual video follow up today s/p left hepatic TARE 10/25/21 and right Y90 radioembolization 03/14/22 and right hepatic TACE 12/18/23  History of present illness: Danny [name], 58 year old male, has a medical history significant for alcohol abuse, HTN, DM, anxiety/depression, migraines, stroke, cirrhosis and bilobar hepatocellular carcinoma. He is familiar to me from left hepatic TARE 10/25/21 and segment VIII radiation segmentectomy 03/14/22. Imaging demonstrated a slight global increase in multifocal bilobar hepatocellular carcinoma with a single LIRADS 5 lesion in segment IV and mutlifocal LIRADS 3-4 lesions in the right lobe with concordant slight increase in a-FP. He is now status post conventional transarterial chemoembolization of the right lobe on 12/18/23. Post-procedural imaging demonstrated stability of disease, without resolution.   He last followed up with me 02/06/24 and reported doing well with the exception of some fatigue. He denied abdominal pain. We discussed following up in 3 months with an MRI abdomen and a clinic visit. We also discussed obtaining outpatient CBC, CMP, INR, and alpha-fetoprotein.   His MRI study was completed 06/30/24 and he presents today via virtual video visit to discuss these results.   Past Medical History:  Diagnosis Date   Alcohol abuse    Anxiety    Back pain    Cancer (HCC)    Cirrhosis (HCC)    CVA (cerebral infarction)    Depression    Diabetes mellitus    Hepatitis C    Hypertension    Migraines    Stroke [facility])     Past Surgical History:  Procedure Laterality Date   ABDOMINAL  AORTOGRAM W/LOWER EXTREMITY Right 05/06/2022   Procedure: ABDOMINAL AORTOGRAM W/LOWER EXTREMITY;  Surgeon: Sheree Penne Bruckner, MD;  Location: [facility] INVASIVE CV LAB;  Service: Cardiovascular;  Laterality: Right;   CAST APPLICATION Right 11/02/2021   Procedure: CAST APPLICATION;  Surgeon: Margrette Taft BRAVO, MD;  Location: [facility] ORS;  Service: Orthopedics;  Laterality: Right;   ESOPHAGOGASTRODUODENOSCOPY (EGD) WITH PROPOFOL  N/A 09/16/2022   Procedure: ESOPHAGOGASTRODUODENOSCOPY (EGD) WITH PROPOFOL ;  Surgeon: Hasty Carlin POUR, DO;  Location: [facility] ENDO SUITE;  Service: Endoscopy;  Laterality: N/A;  12:30pm, asa 3, pt knows to arrive at 6:45   FOOT SURGERY     INCISION AND DRAINAGE Right 11/02/2021   Procedure: INCISION AND DRAINAGE RIGHT KNEE;  Surgeon: Margrette Taft BRAVO, MD;  Location: [facility] ORS;  Service: Orthopedics;  Laterality: Right;   IR 3D INDEPENDENT WKST  10/11/2021   IR 3D INDEPENDENT WKST  02/28/2022   IR 3D INDEPENDENT WKST  03/14/2022   IR 3D INDEPENDENT WKST  12/18/2023   IR 3D INDEPENDENT WKST  12/18/2023   IR 3D INDEPENDENT WKST  12/18/2023   IR ANGIOGRAM SELECTIVE EACH ADDITIONAL VESSEL  10/11/2021   IR ANGIOGRAM SELECTIVE EACH ADDITIONAL VESSEL  10/11/2021   IR ANGIOGRAM SELECTIVE EACH ADDITIONAL VESSEL  10/11/2021   IR ANGIOGRAM SELECTIVE EACH ADDITIONAL VESSEL  10/11/2021   IR ANGIOGRAM SELECTIVE EACH ADDITIONAL VESSEL  10/11/2021   IR ANGIOGRAM SELECTIVE EACH ADDITIONAL VESSEL  10/11/2021   IR ANGIOGRAM SELECTIVE EACH ADDITIONAL VESSEL  10/25/2021   IR ANGIOGRAM SELECTIVE EACH ADDITIONAL VESSEL  10/25/2021   IR ANGIOGRAM SELECTIVE EACH ADDITIONAL VESSEL  02/28/2022  IR ANGIOGRAM SELECTIVE EACH ADDITIONAL VESSEL  02/28/2022   IR ANGIOGRAM SELECTIVE EACH ADDITIONAL VESSEL  02/28/2022   IR ANGIOGRAM SELECTIVE EACH ADDITIONAL VESSEL  03/14/2022   IR ANGIOGRAM SELECTIVE EACH ADDITIONAL VESSEL  12/18/2023   IR ANGIOGRAM SELECTIVE EACH ADDITIONAL VESSEL  12/18/2023   IR ANGIOGRAM VISCERAL SELECTIVE  10/11/2021    IR ANGIOGRAM VISCERAL SELECTIVE  02/28/2022   IR ANGIOGRAM VISCERAL SELECTIVE  03/14/2022   IR ANGIOGRAM VISCERAL SELECTIVE  12/18/2023   IR EMBO TUMOR ORGAN ISCHEMIA INFARCT INC GUIDE ROADMAPPING  10/25/2021   IR EMBO TUMOR ORGAN ISCHEMIA INFARCT INC GUIDE ROADMAPPING  10/25/2021   IR EMBO TUMOR ORGAN ISCHEMIA INFARCT INC GUIDE ROADMAPPING  03/14/2022   IR EMBO TUMOR ORGAN ISCHEMIA INFARCT INC GUIDE ROADMAPPING  12/18/2023   IR RADIOLOGIST EVAL & MGMT  08/23/2021   IR RADIOLOGIST EVAL & MGMT  11/21/2021   IR RADIOLOGIST EVAL & MGMT  01/24/2022   IR RADIOLOGIST EVAL & MGMT  04/09/2022   IR RADIOLOGIST EVAL & MGMT  08/05/2022   IR RADIOLOGIST EVAL & MGMT  11/29/2022   IR RADIOLOGIST EVAL & MGMT  06/11/2023   IR RADIOLOGIST EVAL & MGMT  11/17/2023   IR RADIOLOGIST EVAL & MGMT  02/06/2024   IR US  GUIDE VASC ACCESS RIGHT  10/11/2021   IR US  GUIDE VASC ACCESS RIGHT  10/25/2021   IR US  GUIDE VASC ACCESS RIGHT  02/28/2022   IR US  GUIDE VASC ACCESS RIGHT  03/14/2022   IR US  GUIDE VASC ACCESS RIGHT  12/18/2023   MANDIBLE SURGERY     PERIPHERAL VASCULAR INTERVENTION Right 05/06/2022   Procedure: PERIPHERAL VASCULAR INTERVENTION;  Surgeon: Sheree Penne Bruckner, MD;  Location: [facility] INVASIVE CV LAB;  Service: Cardiovascular;  Laterality: Right;  SFA   QUADRICEPS TENDON REPAIR Right 09/04/2021   Procedure: REPAIR QUADRICEP TENDON;  Surgeon: Margrette Taft BRAVO, MD;  Location: [facility] ORS;  Service: Orthopedics;  Laterality: Right;   TENDON EXPLORATION Right 11/02/2021   Procedure: QUADRICEPS TENDON AND PATELLAR TENDON EXPLORATION;  Surgeon: Margrette Taft BRAVO, MD;  Location: [facility] ORS;  Service: Orthopedics;  Laterality: Right;    Allergies: Trazodone, Benadryl  [diphenhydramine ], and Ultram  [tramadol ]  Medications: Prior to Admission medications   Medication Sig Start Date End Date Taking? Authorizing Provider  albuterol  (VENTOLIN  HFA) 108 (90 Base) MCG/ACT inhaler Inhale 2 puffs into the lungs every 6 (six) hours as  needed for wheezing or shortness of breath.    [provider]  ALPRAZolam (XANAX) 0.25 MG tablet Take 0.25 mg by mouth at bedtime.    [provider]  amoxicillin -clavulanate (AUGMENTIN ) 875-125 MG tablet Take 1 tablet by mouth 2 (two) times daily. 05/27/24   Hyatt, Max T, DPM  Cholecalciferol  (VITAMIN D -3) 125 MCG (5000 UT) TABS Take 5,000 Units by mouth daily.    [provider]  citalopram  (CELEXA ) 20 MG tablet Take 20 mg by mouth daily.    [provider]  cyclobenzaprine  (FLEXERIL ) 5 MG tablet Take 5 mg by mouth daily as needed for muscle spasms. 03/06/23   [provider]  gabapentin  (NEURONTIN ) 400 MG capsule Take 400 mg by mouth 2 (two) times daily as needed. 04/17/24   [provider]  lidocaine -prilocaine  (EMLA ) cream Apply to affected area once 03/22/24   Davonna Siad, MD  loperamide  (IMODIUM ) 2 MG capsule Take 2 tablets by mouth after the first loose stool, then 1 tablet after each loose stool. Do not exceed 5 doses per day 05/25/24   Kandala, Hyndavi,  MD  Melatonin 10 MG CAPS Take 10 mg by mouth at bedtime.    [provider]  metFORMIN  (GLUCOPHAGE ) 1000 MG tablet Take 1,000 mg by mouth 2 (two) times daily. 02/10/24   [provider]  mirtazapine (REMERON) 7.5 MG tablet Take 1 tablet every day by oral route for 90 days. 11/17/23   [provider]  mupirocin  ointment (BACTROBAN ) 2 % Apply 1 Application topically 2 (two) times daily. 05/27/24   Hyatt, Max T, DPM  naloxone (NARCAN) nasal spray 4 mg/0.1 mL Place 1 spray into the nose once. 01/01/22   [provider]  ondansetron  (ZOFRAN ) 8 MG tablet Take 1 tablet (8 mg total) by mouth every 8 (eight) hours as needed for nausea or vomiting. 03/22/24   Davonna Siad, MD  oxyCODONE -acetaminophen  (PERCOCET) 10-325 MG tablet Take 1 tablet by mouth every 4 (four) hours as needed for pain. 03/04/24   Kommor, Lum, MD  oxyCODONE -acetaminophen  (PERCOCET/ROXICET)  5-325 MG tablet Take 1 tablet by mouth every 6 (six) hours as needed for severe pain (pain score 7-10). 03/04/24   Kommor, Madison, MD  pantoprazole  (PROTONIX ) 40 MG tablet Take 1 tablet (40 mg total) by mouth daily. 05/07/22 06/23/24  Bethanie Cough, PA-C  prochlorperazine  (COMPAZINE ) 10 MG tablet Take 1 tablet (10 mg total) by mouth every 6 (six) hours as needed for nausea or vomiting. 03/22/24   Davonna Siad, MD  QUEtiapine  (SEROQUEL ) 100 MG tablet Take 100 mg by mouth at bedtime.    [provider]  rosuvastatin  (CRESTOR ) 10 MG tablet Take 1 tablet (10 mg total) by mouth daily. 05/06/22 06/23/24  Sheree Penne Bruckner, MD  Sofosbuvir-Velpatasvir 400-100 MG TABS Take 1 tablet by mouth daily.    [provider]  triamcinolone  cream (KENALOG ) 0.1 % Apply 1 Application topically 2 (two) times daily. 04/26/24 07/25/24  Davonna Siad, MD     Family History  Problem Relation Age of Onset   Heart failure Mother    Stroke Father    Stroke Sister    Cancer Sister        breast cancer   Colon cancer Neg Hx    Liver cancer Neg Hx     Social History   Socioeconomic History   Marital status: Divorced    Spouse name: Not on file   Number of children: Not on file   Years of education: Not on file   Highest education level: Not on file  Occupational History   Not on file  Tobacco Use   Smoking status: Every Day    Current packs/day: 0.50    Average packs/day: 0.5 packs/day for 20.0 years (10.0 ttl pk-yrs)    Types: Cigarettes    Passive exposure: Current   Smokeless tobacco: Never  Vaping Use   Vaping status: Never Used  Substance and Sexual Activity   Alcohol use: Not Currently    Comment: Quit 11 months ago. Used to drink about 6 pack a day. (documented 06/06/21)   Drug use: Yes    Types: Marijuana    Comment: occas   Sexual activity: Yes    Birth control/protection: None  Other Topics Concern   Not on file  Social History Narrative   Not on file    Social Drivers of Health   Financial Resource Strain: Not on file  Food Insecurity: No Food Insecurity (03/09/2024)   Received from [facility]   Hunger Vital Sign    Within the past 12 months, you worried that your  food would run out before you got the money to buy more.: Never true    Within the past 12 months, the food you bought just didn't last and you didn't have money to get more.: Never true  Transportation Needs: No Transportation Needs (03/09/2024)   Received from [facility] - Transportation    Lack of Transportation (Medical): No    Lack of Transportation (Non-Medical): No  Physical Activity: Not on file  Stress: Not on file  Social Connections: Not on file     Vital Signs: There were no vitals taken for this visit.  Physical Exam  Patient is alert, oriented and able to participate fully in the conversation. No apparent discomfort or distress observed. He appears appropriately dressed.    Imaging:   Mapping study 02/28/22     Post Seg VIII TARE (03/14/22)    MR Abdomen 07/18/22  Post-TARE changes in left lobe and segment VIII, no focal enhancing lesions - LIRADS TR-non viable.   MR Abdomen 11/27/22  Post-TARE changes in left lobe and segment VIII, no focal enhancing lesions - LIRADS TR-non viable.   MR abdomen 05/23/23  Post-TARE changes in left lobe and segment VIII.  New segment IV 1.7 cm enhancing mass with washout, previously 1.5 cm = LIRADS 5   MRI abdomen 11/12/23 5 LIRADS 3/4 lesions +/- 1 cm, all less than 2 cm LIRADS 5 lesion in segment IV, 1.7 cm Post Y90 changes in segment VIII.    CT abdomen 01/29/24      MR Abdomen 06/30/24 IMPRESSION: 1. Interval increase in size of multifocal arterially enhancing observations in the right hepatic lobe, some of which demonstrate washout and capsule, consistent with worsening multifocal hepatocellular carcinoma. 2. Unchanged 0.9 cm posterior peripheral segment 6 arterially enhancing  observation. An additional subcentimeter posterior peripheral segment 6 lesion is not seen on today's examination. 3. Cirrhosis with sequela of portal hypertension including splenomegaly and trace perihepatic ascites. 4. Aortic Atherosclerosis (ICD10-I70.0).       Labs:  CBC: Recent Labs    03/24/24 0743 04/21/24 0840 05/25/24 0900 06/23/24 1203  WBC 3.6* 4.8 4.8 4.0  HGB 14.2 13.0 14.2 13.0  HCT 40.8 38.2* 41.7 38.4*  PLT 113* 104* 136* 93*    COAGS: Recent Labs    12/18/23 1207 02/25/24 0943 04/26/24 1119  INR 1.1 1.1 1.2    BMP: Recent Labs    04/21/24 0840 04/26/24 1119 05/25/24 0900 06/23/24 1203  NA 135 136 137 137  K 3.9 3.6 3.6 3.7  CL 101 101 101 101  CO2 23 22 23 24   GLUCOSE 188* 236* 162* 149*  BUN 13 9 7 9   CALCIUM  9.2 9.3 9.4 9.5  CREATININE 0.81 0.84 0.80 1.02  GFRNONAA >60 >60 >60 >60    LIVER FUNCTION TESTS: Recent Labs    04/21/24 0840 04/26/24 1119 05/25/24 0900 06/23/24 1203  BILITOT 1.4* <0.2 0.9 0.6  AST 154* 32 33 33  ALT 51* 26 22 17   ALKPHOS 118 122 120 132*  PROT 7.0 7.3 7.7 7.2  ALBUMIN  3.2* 3.2* 3.5 3.8    Latest Reference Range & Units 10/11/21 08:30 03/14/22 08:00 02/25/24 09:43 04/21/24 08:40 05/25/24 09:00 06/23/24 15:48  AFP, Serum, Tumor Marker 0.0 - 8.4 ng/mL 8.0 8.1 21.0 (H) 19.4 (H) 26.1 (H) 28.6 (H)  (H): Data is abnormally high   Assessment and Plan: 59 year old male with history of hepatitis C/alcoholic cirrhosis (Child Pugh A, ALBI 2) and multifocal, bilobar hepatocellular  carcinoma now status post left lobar hepatic transarterial radioembolization on 10/25/21 and segment VIII radiation segmentectomy on 03/14/22. His hepatitis C is now treated. Imaging demonstrated slight global increase in multifocal bilobar hepatocellular carcinoma with a single LIRADS 5 lesion in segment IV and mutlifocal LIRADS 3-4 lesions in the right lobe with concordant slight increase in a-FP. He is now status post conventional  transarterial chemoembolization of the right lobe on 12/18/23.   Unfortunately, each of the previously visualized masses in the right lobe has increased in size.  He has had a concomitant increase in a-FP.  We discussed these findings in detail, reviewing the images.  We discussed that additional image guided procedures would likely not provide any benefit at this time.  I recommended continuing care with Dr. Davonna.  Plan to follow up with IR as needed.  Ester Sides, MD Pager: [phone]    I spent a total of 25 Minutes in virtual video clinical consultation, greater than 50% of which was counseling/coordinating care for hepatocellular carcinoma.

## 2024-07-05 ENCOUNTER — Ambulatory Visit
Admission: RE | Admit: 2024-07-05 | Discharge: 2024-07-05 | Disposition: A | Discharge status: Home / Self Care | Source: Ambulatory Visit | Attending: Interventional Radiology | Admitting: Interventional Radiology

## 2024-07-05 DIAGNOSIS — C220 Liver cell carcinoma: Secondary | ICD-10-CM

## 2024-07-05 HISTORY — PX: IR RADIOLOGIST EVAL & MGMT: IMG5224

## 2024-07-08 ENCOUNTER — Ambulatory Visit: Admitting: Podiatry

## 2024-07-08 VITALS — Ht 69.0 in | Wt 167.0 lb

## 2024-07-08 DIAGNOSIS — L97512 Non-pressure chronic ulcer of other part of right foot with fat layer exposed: Secondary | ICD-10-CM | POA: Diagnosis not present

## 2024-07-09 NOTE — Progress Notes (Signed)
 Subjective:  Patient ID: Danny [name], male    DOB: 1966/07/08,  MRN: [id number]  Chief Complaint  Patient presents with   Wound Check    RM 5 Patient is here for ulcer of the right hallux. Ulcer is open with minimum drainage.    58 y.o. male presents with the above complaint. History confirmed with patient.    Objective:  Physical Exam: warm, good capillary refill, normal DP and PT pulses, reduced sensation at tips of toes, and right hallux ulceration measuring 0.5 x 0.6 x 0.2 cm.  No active drainage.  No signs of infection.  There is surrounding hyperkeratosis.  Fibrogranular wound bed.  Does not probe to bone currently     Radiographs: Multiple views x-ray of the right foot: Prior radiographs show possible distal tuft erosion but no gaseous changes Assessment:   1. Ulcer of great toe, right, with fat layer exposed (HCC)       Plan:  Patient was evaluated and treated and all questions answered.  Ulcer right hallux -We discussed the etiology and factors that are a part of the wound healing process.  We also discussed the risk of infection both soft tissue and osteomyelitis from open ulceration.  Discussed the risk of limb loss if this happens or worsens. -Debridement as below. -Dressed with Prisma, DSD. -Continue home dressing changes 3 times weekly with 4 x 4 gauze and Prisma collagen.  This was applied in the office today. -Continue off-loading with surgical shoe. -Vascular testing palpable pulses -HgbA1c:   -Last antibiotics: He has completed his oral antibiotics -Imaging: x-ray reviewed, shows some distal tuft changes.  Will see how this heals and if not improving may consider MRI.  Procedure: Excisional Debridement of Wound Tool: Sharp #312 chisel blade/tissue nipper Type of Debridement: Sharp Excisional Frequency: @Every  2 to 3 weeks until appropriately healed.  Dressing is to be changed daily/keeping the wound clean and dry Rationale: Removal of non-viable  soft tissue from the wound to promote healing.  Anesthesia: none Pre-Debridement Wound Measurements: 0.4 x 0.4 cm x 0.2 cm  Post-Debridement Wound Measurements: 0.5 x 0.6 x 0.2 Area devitalized tissue removed(nonviable tissue only): 0.1 cm x 0.2 cm.  Blood loss: Minimal (<50cc) Hemostasis: manual pressure Depth of Debridement: with fat layer exposed Description of tissue removed: Slough and Devitalized Tissue Technique: The wound and the surrounding skin were prepped in usual aseptic fashion.  Aseptic technique was maintained throughout the procedure.  Using #312 blade/tissue nipper sharp debridement of necrotic/nonviable tissue was performed until healthy bleeding wound bed was achieved.  No underlying bone or tendon was exposed during debridement.  The wound was thoroughly irrigated with normal saline solution Wound Progress:  Current Wound Volume: Debridement was performed of the chronic nonhealing diabetic foot wound on right foot hallux.  Debridement removed 0.2 cm x 0.2 cm of the necrotic tissue and subcutaneous tissue and minimal amount of   drainage was not present. Presence/absence of tissue: Necrotic tissue/nonviable tissue present at the base of the wound.  Sharp debridement was performed to remove the necrotic tissue/nonviable tissue back to viable tissue.  No devitalized/nonviable tissue present postdebridement.  Wound appeared clean and clear of infection No material in the wound was present that was identified to be inhibiting healing. Dressing: Dry, sterile, dressing with Prisma collagen on ulcer base followed by gauze and Coban. Disposition: Patient tolerated procedure well. Patient to return for ongoing wound care as noted below.  Return in about 3 weeks (around 07/29/2024) for wound  care.

## 2024-07-12 ENCOUNTER — Encounter: Payer: Self-pay | Admitting: Radiology

## 2024-07-20 NOTE — Progress Notes (Unsigned)
 Patient Care Team: Danny Lurie, FNP (Inactive) as PCP - General (Family Medicine) Danny Carlin POUR, DO as Consulting Physician (Gastroenterology) Danny Siad, MD as Medical Oncologist (Medical Oncology) Danny Joesph SQUIBB, RN as Oncology Nurse Navigator (Medical Oncology)  Clinic Day:  07/21/2024  Referring physician: Vick Lurie, FNP   CHIEF COMPLAINT:  CC: Hepatocellular carcinoma   ASSESSMENT & PLAN:   Assessment & Plan: Danny [name]  is a 58 y.o. male with hepatocellular carcinoma  Hepatocellular carcinoma Patient has a history of hepatocellular carcinoma initially diagnosed in 10/22.  Patient is not a candidate for liver transplant secondary to cirrhosis and hepatitis C. S/p TARE and Y90 in 03/14/2022 and 08/24/2022 respectively.   MRI from 11/27/2023 showed progression of disease Patient had chemo embolization on 12/18/2023 and had partial response and new lesions on recent MRI. CT chest with no evidence of metastasis but does have some stable small lesions AFP: 21 -03/24/2024: Started on Tremelimumab  and Durvalumab  per HIMALAYA trial. Held cycle 2 for a week for elevated AST and restarted after that.  Completed 4 cycles of 4.   - We reviewed the recent MRI abdomen findings together.  There is interval increase in size of multifocal lesions in the right hepatic lobe consistent with worsening hepatocellular carcinoma.  There is also increase in AFP. -We discussed this above findings in detail.  Poor prognosis with progression of disease on immunotherapy discussed in detail. -Patient was seen by Dr. [name] recently and discussed the above findings.  There is no role for local treatment at this time. - Discussed next line of treatment options including using lenvatinib as a single agent.  Discussed that there is an immediate overall survival of 15 months with the use of lenvatinib.  We reviewed side effects of lenvatinib in detail including hypertension, nausea,  vomiting, thrombosis and stroke - Patient is considering not having treatment and focusing on quality of life at this time. - Printed information about lung management was provided. - Patient stated that he would call his decision if he wants to proceed with lenvatinib or choose hospice. -Patient does understand that without treatment his lifespan is limited leading to progression of disease. -Labs reviewed today.  CMP: Normal creatinine, AST: 60, ALT: 17, alkaline phosphatase: 139, magnesium: 1.6. CBC: WBC: 3.9, hemoglobin: 13.2, platelets: 95 -AFP pending today.  Previous AFP trending up and was 28.6   Will await for patient's decision as above and determine return to clinic at that time.  Rash On face and arms.  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 Previous discussions about smoking cessation.   - Encouraged attempts to cut down smoking  Hepatitis C infection Patient has a history of hepatitis C that is not treated.   - Follow-up with Dr.[name]  Diarrhea Significantly improved. Use Imodium  as needed  Pain management Gets pain medications for chronic pain pain clinic.   - Recommended patient to change from Percocet to oxycodone  to avoid Tylenol   Elevated liver enzymes Likely secondary to immunotherapy.  Slightly elevated today  - Continue to monitor   The patient understands the plans discussed today and is in agreement with them.  He knows to contact our office if he develops concerns prior to his  next appointment.  The total time spent in the appointment was 40 minutes for the encounter  with patient, including review of chart and various tests results, discussions about plan of care  and coordination of care plan   Danny Dry, MD  [facility] [place] - A DEPT OF [facility] [address] [place] [address] Dept: [phone] Dept Fax: [phone]   No orders of the defined types were placed in this encounter.    ONCOLOGY HISTORY:   Diagnosis: Multifocal hepatocellular carcinoma:  -06/29/2021: MRI Liver with and without contrast : Arterially hyperenhancing mass of the central posterior left lobe of the liver, hepatic segment II/III, measuring 2.7 x 2.1 cm, with some evidence of washout and capsular enhancement. LI-RADS category 5, consistent with hepatocellular carcinoma. Additional subtle arterially hyperenhancing lesion of the central liver dome, hepatic segment VIII, measuring 1.1 cm, without evidence of washout or capsule. LI-RADS category 3, intermediate suspicion for hepatocellular carcinoma. Attention on follow-up. -10/25/2021: Left hepatic transarterial radioembolization  -01/18/2022: MRI Abdomen with and without contrast: Decreased size and lack of internal enhancement of treated mass in segment 3 of the left lobe. (LR-TR Nonviable). Increased size of 15 mm hypervascular lesion in the liver dome. (LI-RADS Category 5: Definitely HCC). 9 mm hypervascular lesion near the junction of the right and left lobes, not seen on prior exam. (LI-RADS Category 3: Intermediate probability of malignancy). No evidence of abdominal metastatic disease. Stable splenomegaly and portosystemic collaterals, consistent with portal venous hypertension. -03/14/2022: Segment VIII radiation segmentectomy- Right Y90 embolization -07/18/2022: MRI with and without contrast: Low-level, diffuse hyperenhancement of the left lobe of the liver as well as hepatic segment VIII with associated atrophy, but without suspicious focal hyperenhancement. Findings are consistent treated multifocal hepatocellular carcinoma without evidence of  residual viable disease. LI-RADS TR, nonviable. No new liver lesions. No evidence of lymphadenopathy or metastatic disease in the abdomen. Splenomegaly and large splenic varices. -11/27/2022: MRI abdomen:Post-TARE changes in left lobe and segment VIII, no focal enhancing lesions - LIRADS TR-non viable.  -05/23/2023: MRI Abdomen with and without contrast: Post-TARE changes in left lobe and segment VIII. New segment IV 1.7 cm enhancing mass with washout, previously 1.5 cm = LIRADS 5.  -05/23/2023: Tumor marker : AFP: 7.0 -11/12/2023: MRI Abdomen with and without contrast: Redemonstration of multiple liver lesions, as described above. There is a 1.2 x 1.4 cm LI-RADS 5 lesion in the left hepatic lobe, segment 4B, which is slightly decreased in size since the prior study. There are 2, LI-RADS 5 lesions in the right hepatic dome, which are slightly increased in size since the prior study. There are 3, LI-RADS 4 lesions in the right hepatic lobe, segments 6/7, which are grossly stable. There is posttreatment cavity in the right hepatic dome, at the junctions of segment 8 and 4A a measuring approximately 2.1 x 2.2 cm. The cavity exhibits peripheral irregular enhancing wall which exhibits washout on delayed/equilibrium phase images, favoring viable tumor. No new liver lesion seen. -12/18/2023: Transarterial chemoembolization of right lobe and segment IV.  -01/29/2024: CT Abdomen and Pelvis with contrast: Suspected multifocal HCC, including: 2.4 cm rim-enhancing lesion at the junction of segments (series 6/image 12), previously 2.2 cm . 1.6 cm enhancing lesion medially in segment 7 (series 6/image 7), previously 1.4 cm .1.6 cm vague enhancing lesion in segment 4b (series 6/image 29), previously 1.4 cm. Additionally, there is a 2.6 cm irregular enhancing lesion in segment 2 (series 6 /  image 14) obscured on prior MR, favoring an additional lesion. Suspected multifocal HCC, favored to be overall similar to prior MR,  noting differences in technique. Cirrhosis with moderate splenomegaly. -03/10/2024: CT Chest with contrast: No significant interval change. Few scattered tiny lung nodules identified under 5 mm are similar to previous. No new dominant lung nodule. -Patient not a candidate for local therapy because of multifocal HCC. -03/24/2024 - current: Tremelimumab  + Durvalumab  (STRIDE regimen) -06/15/2024: CT CAP: Multiple liver lesions are again seen, most clearly visible lesion in the right anterior liver dome, hepatic segment 8, appears to have slightly enlarged when compared to prior exam.  Lesion in the posterior liver dome, hepatic segment 7, appears similar.  No obvious new lesions.  No lymphadenopathy or metastatic disease in chest, abdomen, pelvis. - 06/30/2024: MR abdomen with and without contrast: Interval increase in size of multifocal arterially enhancing observations in the right hepatic lobe, some of which demonstrate washout and capsule, consistent with worsening multifocal hepatocellular carcinoma.Unchanged 0.9 cm posterior peripheral segment 6 arterially enhancing observation. An additional subcentimeter posterior peripheral segment 6 lesion is not seen on today's examination.Cirrhosis with sequela of portal hypertension including splenomegaly and trace perihepatic ascites.   Current Treatment: TBD  INTERVAL HISTORY:  Discussed the use of AI scribe software for clinical note transcription with the patient, who gave verbal consent to proceed.  History of Present Illness Danny [name] is a 58 year old male with liver cancer who presents with concerns about disease progression and treatment efficacy. He is accompanied by his niece. He has been undergoing treatment with immunotherapy, which initially showed some response with a reduction in liver cancer markers.  Patient was recently seen by Dr. [name] and discussed the MRI findings in detail with her.  Also discussed that there is no role  for local treatment for him anymore.  We discussed next treatment options detail.  He is worried about the potential side effects of new treatments, particularly those that could impact his quality of life, such as nausea, vomiting, increased blood pressure, and risk of blood clots.  He is currently taking Tylenol  and gabapentin  for pain management. He and his family are considering the impact of treatment side effects on his quality of life, particularly in relation to spending time with his grandchildren.    I have reviewed the past medical history, past surgical history, social history and family history with the patient and they are unchanged from previous note.  ALLERGIES:  is allergic to trazodone, benadryl  [diphenhydramine ], and ultram  [tramadol ].  MEDICATIONS:  Current Outpatient Medications  Medication Sig Dispense Refill   albuterol  (VENTOLIN  HFA) 108 (90 Base) MCG/ACT inhaler Inhale 2 puffs into the lungs every 6 (six) hours as needed for wheezing or shortness of breath.     ALPRAZolam (XANAX) 0.25 MG tablet Take 0.25 mg by mouth at bedtime.     amoxicillin -clavulanate (AUGMENTIN ) 875-125 MG tablet Take 1 tablet by mouth 2 (two) times daily. 20 tablet 0   Cholecalciferol  (VITAMIN D -3) 125 MCG (5000 UT) TABS Take 5,000 Units by mouth daily.     citalopram  (CELEXA ) 20 MG tablet Take 20 mg by mouth daily.     cyclobenzaprine  (FLEXERIL ) 5 MG tablet Take 5 mg by mouth daily as needed for muscle spasms.     gabapentin  (NEURONTIN ) 400 MG capsule Take 400 mg by mouth 2 (two) times daily as needed.     lidocaine -prilocaine  (EMLA ) cream Apply to affected area once 30 g 3   loperamide  (  IMODIUM ) 2 MG capsule Take 2 tablets by mouth after the first loose stool, then 1 tablet after each loose stool. Do not exceed 5 doses per day 30 capsule 1   Melatonin 10 MG CAPS Take 10 mg by mouth at bedtime.     metFORMIN  (GLUCOPHAGE ) 1000 MG tablet Take 1,000 mg by mouth 2 (two) times daily.      mirtazapine (REMERON) 7.5 MG tablet Take 1 tablet every day by oral route for 90 days.     mupirocin  ointment (BACTROBAN ) 2 % Apply 1 Application topically 2 (two) times daily. 22 g 0   naloxone (NARCAN) nasal spray 4 mg/0.1 mL Place 1 spray into the nose once.     ondansetron  (ZOFRAN ) 8 MG tablet Take 1 tablet (8 mg total) by mouth every 8 (eight) hours as needed for nausea or vomiting. 30 tablet 1   oxyCODONE -acetaminophen  (PERCOCET) 10-325 MG tablet Take 1 tablet by mouth every 4 (four) hours as needed for pain. 15 tablet 0   oxyCODONE -acetaminophen  (PERCOCET/ROXICET) 5-325 MG tablet Take 1 tablet by mouth every 6 (six) hours as needed for severe pain (pain score 7-10). 6 tablet 0   prochlorperazine  (COMPAZINE ) 10 MG tablet Take 1 tablet (10 mg total) by mouth every 6 (six) hours as needed for nausea or vomiting. 30 tablet 1   QUEtiapine  (SEROQUEL ) 100 MG tablet Take 100 mg by mouth at bedtime.     Sofosbuvir-Velpatasvir 400-100 MG TABS Take 1 tablet by mouth daily.     triamcinolone  cream (KENALOG ) 0.1 % Apply 1 Application topically 2 (two) times daily. 60 g 2   pantoprazole  (PROTONIX ) 40 MG tablet Take 1 tablet (40 mg total) by mouth daily. (Patient not taking: Reported on 07/21/2024) 30 tablet 2   rosuvastatin  (CRESTOR ) 10 MG tablet Take 1 tablet (10 mg total) by mouth daily. (Patient not taking: Reported on 07/21/2024) 30 tablet 11   No current facility-administered medications for this visit.    REVIEW OF SYSTEMS:   Constitutional: Denies fevers, chills or abnormal weight loss Eyes: Denies blurriness of vision Ears, nose, mouth, throat, and face: Denies mucositis or sore throat Respiratory: Denies cough, dyspnea or wheezes Cardiovascular: Denies palpitation, chest discomfort or lower extremity swelling Gastrointestinal:  Denies nausea, heartburn or change in bowel habits Skin: Denies abnormal skin rashes Lymphatics: Denies new lymphadenopathy or easy bruising Neurological:Denies  numbness, tingling or new weaknesses Behavioral/Psych: Mood is stable, no new changes  All other systems were reviewed with the patient and are negative.   VITALS:  Blood pressure (!) 143/61, pulse 85, temperature 97.7 F (36.5 C), temperature source Tympanic, resp. rate 18, height 5' 9 (1.753 m), weight 170 lb (77.1 kg), SpO2 99%.  Wt Readings from Last 3 Encounters:  07/21/24 170 lb (77.1 kg)  07/08/24 167 lb (75.8 kg)  06/23/24 167 lb (75.8 kg)    Body mass index is 25.1 kg/m.  Performance status (ECOG): 2 - Symptomatic, <50% confined to bed  PHYSICAL EXAM:   GENERAL:alert, no distress and comfortable SKIN: Erythematous rash on his chin, necrotic wound on his right great toe LUNGS: clear to auscultation and percussion with normal breathing effort HEART: regular rate & rhythm and no murmurs and no lower extremity edema ABDOMEN:abdomen soft, non-tender and normal bowel sounds Musculoskeletal:no cyanosis of digits and no clubbing  NEURO: alert & oriented x 3 with fluent speech  LABORATORY DATA:  I have reviewed the data as listed    Component Value Date/Time   NA 140 07/21/2024 0756  K 3.9 07/21/2024 0756   CL 101 07/21/2024 0756   CO2 25 07/21/2024 0756   GLUCOSE 129 (H) 07/21/2024 0756   BUN 8 07/21/2024 0756   CREATININE 0.88 07/21/2024 0756   CREATININE 0.83 06/11/2021 0907   CALCIUM  9.5 07/21/2024 0756   PROT 7.4 07/21/2024 0756   ALBUMIN  3.9 07/21/2024 0756   AST 60 (H) 07/21/2024 0756   ALT 17 07/21/2024 0756   ALKPHOS 139 (H) 07/21/2024 0756   BILITOT 1.0 07/21/2024 0756   GFRNONAA >60 07/21/2024 0756   GFRAA >60 05/24/2019 1858   Lab Results  Component Value Date   WBC 3.9 (L) 07/21/2024   NEUTROABS 2.3 07/21/2024   HGB 13.2 07/21/2024   HCT 39.4 07/21/2024   MCV 93.1 07/21/2024   PLT 95 (L) 07/21/2024      Chemistry      Component Value Date/Time   NA 140 07/21/2024 0756   K 3.9 07/21/2024 0756   CL 101 07/21/2024 0756   CO2 25 07/21/2024  0756   BUN 8 07/21/2024 0756   CREATININE 0.88 07/21/2024 0756   CREATININE 0.83 06/11/2021 0907      Component Value Date/Time   CALCIUM  9.5 07/21/2024 0756   ALKPHOS 139 (H) 07/21/2024 0756   AST 60 (H) 07/21/2024 0756   ALT 17 07/21/2024 0756   BILITOT 1.0 07/21/2024 0756      Latest Reference Range & Units 06/23/24 15:48  AFP, Serum, Tumor Marker 0.0 - 8.4 ng/mL 28.6 (H)  (H): Data is abnormally high   Latest Reference Range & Units 05/25/24 09:00  TSH 0.350 - 4.500 uIU/mL 3.387  Thyroxine (T4) 4.5 - 12.0 ug/dL 7.8   RADIOGRAPHIC STUDIES: I have personally reviewed the radiological images as listed and agreed with the findings in the report.  CLINICAL DATA:  Multifocal hepatocellular carcinoma status post left lobar hepatic transarterial radioembolization 10/25/2021, segment 8 radiation segmentectomy 03/14/2022, and segment 4 and right hepatic transarterial chemoembolization 12/18/2023. Patient now on immunotherapy.   EXAM: MRI ABDOMEN WITHOUT AND WITH CONTRAST   TECHNIQUE: Multiplanar multisequence MR imaging of the abdomen was performed both before and after the administration of intravenous contrast.   CONTRAST:  7mL GADAVIST  GADOBUTROL  1 MMOL/ML IV SOLN   COMPARISON:  CT abdomen and pelvis dated 06/15/2024, MRI abdomen dated 11/12/2023   FINDINGS: Lower chest: No acute findings.   Hepatobiliary: Cirrhotic liver morphology. Persistent capsular retraction involving segment 5/8 and atrophic, posttreatment changes of the left hepatic lobe. No bile duct dilation. Normal gallbladder.   Interval increase in size of multifocal arterially enhancing observations compared to 11/12/2023. Many of these lesions are not well seen on recent CT.   -1.9 x 1.6 cm segment 8 (12:20) with washout and capsule, previously 1.6 x 1.3 cm (LR 5)   -2.3 x 2.0 cm (19:17) with washout and capsule, previously 1.4 x 1.3 cm (LR 5)   -4.1 x 3.0 cm treated segment 8 lesion with  irregular peripheral enhancement (12:25), previously 2.8 x 2.2 cm (remeasured) (LR-TR viable)   -2.8 x 2.5 cm segment 4 (12:41) with washout and capsule, previously 1.4 x 1.2 cm (LR 5)   -1.3 x 1.2 cm posterior peripheral segment 6 (12:37) without washout or capsule demonstrating mild restricted diffusion, previously 0.8 x 0.7 cm (LR 4)   -1.7 x 1.4 cm segment 5/6 (12:66) without washout or capsule, previously 0.9 x 0.8 cm (LR 4)   A 0.9 x 0.7 cm posterior peripheral segment 6 arterially enhancing observation (12:41) is  unchanged (LR 3). An additional subcentimeter posterior peripheral segment 6 lesion is not seen on today's examination.   Pancreas: No mass, inflammatory changes, or other parenchymal abnormality identified.   Spleen:  Unchanged splenomegaly measuring 14.8 cm.   Adrenals/Urinary Tract: No adrenal nodules. No suspicious renal masses identified. No evidence of hydronephrosis. No specific follow-up imaging recommended.   Stomach/Bowel: Visualized portions within the abdomen are unremarkable.   Vascular/Lymphatic: No pathologically enlarged lymph nodes identified. No abdominal aortic aneurysm demonstrated. Aortic atherosclerosis.   Other:  Trace perihepatic ascites.   Musculoskeletal: No suspicious bone lesions identified.   IMPRESSION: 1. Interval increase in size of multifocal arterially enhancing observations in the right hepatic lobe, some of which demonstrate washout and capsule, consistent with worsening multifocal hepatocellular carcinoma. 2. Unchanged 0.9 cm posterior peripheral segment 6 arterially enhancing observation. An additional subcentimeter posterior peripheral segment 6 lesion is not seen on today's examination. 3. Cirrhosis with sequela of portal hypertension including splenomegaly and trace perihepatic ascites. 4. Aortic Atherosclerosis (ICD10-I70.0).     Electronically Signed   By: Limin  Xu M.D.   On: 07/01/2024 16:07

## 2024-07-21 ENCOUNTER — Ambulatory Visit

## 2024-07-21 ENCOUNTER — Inpatient Hospital Stay (HOSPITAL_BASED_OUTPATIENT_CLINIC_OR_DEPARTMENT_OTHER): Admitting: Oncology

## 2024-07-21 ENCOUNTER — Inpatient Hospital Stay

## 2024-07-21 ENCOUNTER — Other Ambulatory Visit

## 2024-07-21 ENCOUNTER — Inpatient Hospital Stay: Attending: Oncology

## 2024-07-21 VITALS — BP 143/61 | HR 85 | Temp 97.7°F | Resp 18 | Ht 69.0 in | Wt 170.0 lb

## 2024-07-21 DIAGNOSIS — R21 Rash and other nonspecific skin eruption: Secondary | ICD-10-CM

## 2024-07-21 DIAGNOSIS — F17200 Nicotine dependence, unspecified, uncomplicated: Secondary | ICD-10-CM | POA: Insufficient documentation

## 2024-07-21 DIAGNOSIS — C220 Liver cell carcinoma: Secondary | ICD-10-CM | POA: Diagnosis not present

## 2024-07-21 DIAGNOSIS — B1920 Unspecified viral hepatitis C without hepatic coma: Secondary | ICD-10-CM | POA: Insufficient documentation

## 2024-07-21 DIAGNOSIS — D696 Thrombocytopenia, unspecified: Secondary | ICD-10-CM

## 2024-07-21 DIAGNOSIS — B182 Chronic viral hepatitis C: Secondary | ICD-10-CM

## 2024-07-21 DIAGNOSIS — Z720 Tobacco use: Secondary | ICD-10-CM | POA: Diagnosis not present

## 2024-07-21 DIAGNOSIS — R52 Pain, unspecified: Secondary | ICD-10-CM | POA: Diagnosis not present

## 2024-07-21 LAB — COMPREHENSIVE METABOLIC PANEL WITH GFR
ALT: 17 U/L (ref 0–44)
AST: 60 U/L — ABNORMAL HIGH (ref 15–41)
Albumin: 3.9 g/dL (ref 3.5–5.0)
Alkaline Phosphatase: 139 U/L — ABNORMAL HIGH (ref 38–126)
Anion gap: 13 (ref 5–15)
BUN: 8 mg/dL (ref 6–20)
CO2: 25 mmol/L (ref 22–32)
Calcium: 9.5 mg/dL (ref 8.9–10.3)
Chloride: 101 mmol/L (ref 98–111)
Creatinine, Ser: 0.88 mg/dL (ref 0.61–1.24)
GFR, Estimated: >60 mL/min (ref >60)
Glucose, Bld: 129 mg/dL — ABNORMAL HIGH (ref 70–99)
Potassium: 3.9 mmol/L (ref 3.5–5.1)
Sodium: 140 mmol/L (ref 135–145)
Total Bilirubin: 1 mg/dL (ref 0.0–1.2)
Total Protein: 7.4 g/dL (ref 6.5–8.1)

## 2024-07-21 LAB — CBC WITH DIFFERENTIAL/PLATELET
Abs Immature Granulocytes: 0.01 K/uL (ref 0.00–0.07)
Basophils Absolute: 0 K/uL (ref 0.0–0.1)
Basophils Relative: 1 %
Eosinophils Absolute: 0.4 K/uL (ref 0.0–0.5)
Eosinophils Relative: 10 %
HCT: 39.4 % (ref 39.0–52.0)
Hemoglobin: 13.2 g/dL (ref 13.0–17.0)
Immature Granulocytes: 0 %
Lymphocytes Relative: 23 %
Lymphs Abs: 0.9 K/uL (ref 0.7–4.0)
MCH: 31.2 pg (ref 26.0–34.0)
MCHC: 33.5 g/dL (ref 30.0–36.0)
MCV: 93.1 fL (ref 80.0–100.0)
Monocytes Absolute: 0.3 K/uL (ref 0.1–1.0)
Monocytes Relative: 8 %
Neutro Abs: 2.3 K/uL (ref 1.7–7.7)
Neutrophils Relative %: 58 %
Platelets: 95 K/uL — ABNORMAL LOW (ref 150–400)
RBC: 4.23 MIL/uL (ref 4.22–5.81)
RDW: 13.4 % (ref 11.5–15.5)
WBC: 3.9 K/uL — ABNORMAL LOW (ref 4.0–10.5)
nRBC: 0 % (ref 0.0–0.2)

## 2024-07-21 LAB — MAGNESIUM: Magnesium: 1.6 mg/dL — ABNORMAL LOW (ref 1.7–2.4)

## 2024-07-21 NOTE — Progress Notes (Signed)
No treatment today due to progression per MD.

## 2024-07-21 NOTE — Patient Instructions (Addendum)
 [facility] Discharge Instructions   You were seen and examined today by Dr. Davonna.  She reviewed the results of your lab work which are normal/stable.   She reviewed the results of your MRI that showed the cancer has gotten worse.   We will hold your treatment today. We will change your treatment to a pill called Lenvima. This is a pill you will take at home every day. Your blood work would need to be monitored every 2 weeks while on this medication.   Return as scheduled.    Thank you for choosing [facility] at [facility] to provide your oncology and hematology care.  To afford each patient quality time with our provider, please arrive at least 15 minutes before your scheduled appointment time.   If you have a lab appointment with the Cancer Center please come in thru the Main Entrance and check in at the main information desk.  You need to re-schedule your appointment should you arrive 10 or more minutes late.  We strive to give you quality time with our providers, and arriving late affects you and other patients whose appointments are after yours.  Also, if you no show three or more times for appointments you may be dismissed from the clinic at the providers discretion.     Again, thank you for choosing [facility].  Our hope is that these requests will decrease the amount of time that you wait before being seen by our physicians.       _____________________________________________________________  Should you have questions after your visit to [facility], please contact our office at [phone] and follow the prompts.  Our office hours are 8:00 a.m. and 4:30 p.m. Monday - Friday.  Please note that voicemails left after 4:00 p.m. may not be returned until the following business day.  We are closed weekends and major holidays.  You do have access to a nurse 24-7, just call the main number to the  clinic [phone] and do not press any options, hold on the line and a nurse will answer the phone.    For prescription refill requests, have your pharmacy contact our office and allow 72 hours.    Due to Covid, you will need to wear a mask upon entering the hospital. If you do not have a mask, a mask will be given to you at the Main Entrance upon arrival. For doctor visits, patients may have 1 support person age 30 or older with them. For treatment visits, patients can not have anyone with them due to social distancing guidelines and our immunocompromised population.

## 2024-07-22 LAB — AFP TUMOR MARKER: AFP, Serum, Tumor Marker: 36.4 ng/mL — ABNORMAL HIGH (ref 0.0–8.4)

## 2024-07-29 ENCOUNTER — Ambulatory Visit: Admitting: Podiatry

## 2024-07-30 ENCOUNTER — Other Ambulatory Visit: Payer: Self-pay

## 2024-08-18 ENCOUNTER — Inpatient Hospital Stay: Admitting: Oncology

## 2024-08-18 ENCOUNTER — Inpatient Hospital Stay

## 2024-08-24 ENCOUNTER — Ambulatory Visit: Admitting: Podiatry

## 2024-08-26 ENCOUNTER — Other Ambulatory Visit: Payer: Self-pay

## 2024-08-26 ENCOUNTER — Inpatient Hospital Stay (HOSPITAL_COMMUNITY)
Admission: EM | Admit: 2024-08-26 | Discharge: 2024-08-28 | DRG: 616 | Disposition: A | Discharge status: Home / Self Care | Source: Ambulatory Visit | Attending: Internal Medicine | Admitting: Internal Medicine

## 2024-08-26 ENCOUNTER — Emergency Department (HOSPITAL_COMMUNITY)

## 2024-08-26 ENCOUNTER — Ambulatory Visit

## 2024-08-26 ENCOUNTER — Encounter (HOSPITAL_COMMUNITY): Payer: Self-pay

## 2024-08-26 ENCOUNTER — Emergency Department (EMERGENCY_DEPARTMENT_HOSPITAL)

## 2024-08-26 ENCOUNTER — Ambulatory Visit: Admitting: Podiatry

## 2024-08-26 VITALS — BP 157/73 | HR 101 | Temp 95.5°F | Ht 69.0 in | Wt 170.0 lb

## 2024-08-26 DIAGNOSIS — E1140 Type 2 diabetes mellitus with diabetic neuropathy, unspecified: Secondary | ICD-10-CM | POA: Diagnosis present

## 2024-08-26 DIAGNOSIS — E1169 Type 2 diabetes mellitus with other specified complication: Principal | ICD-10-CM | POA: Diagnosis present

## 2024-08-26 DIAGNOSIS — F411 Generalized anxiety disorder: Secondary | ICD-10-CM | POA: Diagnosis present

## 2024-08-26 DIAGNOSIS — L089 Local infection of the skin and subcutaneous tissue, unspecified: Principal | ICD-10-CM | POA: Diagnosis present

## 2024-08-26 DIAGNOSIS — L97512 Non-pressure chronic ulcer of other part of right foot with fat layer exposed: Secondary | ICD-10-CM

## 2024-08-26 DIAGNOSIS — Z79899 Other long term (current) drug therapy: Secondary | ICD-10-CM

## 2024-08-26 DIAGNOSIS — I96 Gangrene, not elsewhere classified: Secondary | ICD-10-CM | POA: Diagnosis not present

## 2024-08-26 DIAGNOSIS — K219 Gastro-esophageal reflux disease without esophagitis: Secondary | ICD-10-CM | POA: Diagnosis present

## 2024-08-26 DIAGNOSIS — E1122 Type 2 diabetes mellitus with diabetic chronic kidney disease: Secondary | ICD-10-CM | POA: Diagnosis present

## 2024-08-26 DIAGNOSIS — F17210 Nicotine dependence, cigarettes, uncomplicated: Secondary | ICD-10-CM | POA: Diagnosis present

## 2024-08-26 DIAGNOSIS — I10 Essential (primary) hypertension: Secondary | ICD-10-CM | POA: Diagnosis not present

## 2024-08-26 DIAGNOSIS — F32A Depression, unspecified: Secondary | ICD-10-CM | POA: Diagnosis present

## 2024-08-26 DIAGNOSIS — E11628 Type 2 diabetes mellitus with other skin complications: Secondary | ICD-10-CM

## 2024-08-26 DIAGNOSIS — N1831 Chronic kidney disease, stage 3a: Secondary | ICD-10-CM | POA: Diagnosis present

## 2024-08-26 DIAGNOSIS — E785 Hyperlipidemia, unspecified: Secondary | ICD-10-CM | POA: Diagnosis present

## 2024-08-26 DIAGNOSIS — L0390 Cellulitis, unspecified: Secondary | ICD-10-CM | POA: Diagnosis not present

## 2024-08-26 DIAGNOSIS — A480 Gas gangrene: Secondary | ICD-10-CM | POA: Diagnosis present

## 2024-08-26 DIAGNOSIS — J449 Chronic obstructive pulmonary disease, unspecified: Secondary | ICD-10-CM | POA: Diagnosis present

## 2024-08-26 DIAGNOSIS — Z8673 Personal history of transient ischemic attack (TIA), and cerebral infarction without residual deficits: Secondary | ICD-10-CM

## 2024-08-26 DIAGNOSIS — I129 Hypertensive chronic kidney disease with stage 1 through stage 4 chronic kidney disease, or unspecified chronic kidney disease: Secondary | ICD-10-CM | POA: Diagnosis present

## 2024-08-26 DIAGNOSIS — E872 Acidosis, unspecified: Secondary | ICD-10-CM | POA: Diagnosis present

## 2024-08-26 DIAGNOSIS — Z823 Family history of stroke: Secondary | ICD-10-CM

## 2024-08-26 DIAGNOSIS — M86171 Other acute osteomyelitis, right ankle and foot: Secondary | ICD-10-CM | POA: Diagnosis present

## 2024-08-26 DIAGNOSIS — C220 Liver cell carcinoma: Secondary | ICD-10-CM | POA: Diagnosis present

## 2024-08-26 DIAGNOSIS — Z888 Allergy status to other drugs, medicaments and biological substances status: Secondary | ICD-10-CM | POA: Diagnosis not present

## 2024-08-26 DIAGNOSIS — K7469 Other cirrhosis of liver: Secondary | ICD-10-CM | POA: Diagnosis present

## 2024-08-26 DIAGNOSIS — M869 Osteomyelitis, unspecified: Principal | ICD-10-CM

## 2024-08-26 DIAGNOSIS — Z7984 Long term (current) use of oral hypoglycemic drugs: Secondary | ICD-10-CM | POA: Diagnosis not present

## 2024-08-26 DIAGNOSIS — E1152 Type 2 diabetes mellitus with diabetic peripheral angiopathy with gangrene: Secondary | ICD-10-CM | POA: Diagnosis present

## 2024-08-26 DIAGNOSIS — B1920 Unspecified viral hepatitis C without hepatic coma: Secondary | ICD-10-CM | POA: Diagnosis present

## 2024-08-26 DIAGNOSIS — Z8249 Family history of ischemic heart disease and other diseases of the circulatory system: Secondary | ICD-10-CM | POA: Diagnosis not present

## 2024-08-26 DIAGNOSIS — I739 Peripheral vascular disease, unspecified: Secondary | ICD-10-CM | POA: Diagnosis present

## 2024-08-26 DIAGNOSIS — E119 Type 2 diabetes mellitus without complications: Secondary | ICD-10-CM

## 2024-08-26 DIAGNOSIS — Z9221 Personal history of antineoplastic chemotherapy: Secondary | ICD-10-CM

## 2024-08-26 DIAGNOSIS — Z8619 Personal history of other infectious and parasitic diseases: Secondary | ICD-10-CM

## 2024-08-26 DIAGNOSIS — T148XXA Other injury of unspecified body region, initial encounter: Secondary | ICD-10-CM | POA: Diagnosis not present

## 2024-08-26 LAB — CBC WITH DIFFERENTIAL/PLATELET
Abs Immature Granulocytes: 0.02 K/uL (ref 0.00–0.07)
Basophils Absolute: 0.1 K/uL (ref 0.0–0.1)
Basophils Relative: 1 %
Eosinophils Absolute: 0.3 K/uL (ref 0.0–0.5)
Eosinophils Relative: 5 %
HCT: 41.8 % (ref 39.0–52.0)
Hemoglobin: 13.9 g/dL (ref 13.0–17.0)
Immature Granulocytes: 0 %
Lymphocytes Relative: 15 %
Lymphs Abs: 0.9 K/uL (ref 0.7–4.0)
MCH: 31 pg (ref 26.0–34.0)
MCHC: 33.3 g/dL (ref 30.0–36.0)
MCV: 93.1 fL (ref 80.0–100.0)
Monocytes Absolute: 0.5 K/uL (ref 0.1–1.0)
Monocytes Relative: 7 %
Neutro Abs: 4.6 K/uL (ref 1.7–7.7)
Neutrophils Relative %: 72 %
Platelets: 149 K/uL — ABNORMAL LOW (ref 150–400)
RBC: 4.49 MIL/uL (ref 4.22–5.81)
RDW: 13.3 % (ref 11.5–15.5)
WBC: 6.4 K/uL (ref 4.0–10.5)
nRBC: 0 % (ref 0.0–0.2)

## 2024-08-26 LAB — BASIC METABOLIC PANEL WITH GFR
Anion gap: 10 (ref 5–15)
BUN: 9 mg/dL (ref 6–20)
CO2: 24 mmol/L (ref 22–32)
Calcium: 9.7 mg/dL (ref 8.9–10.3)
Chloride: 102 mmol/L (ref 98–111)
Creatinine, Ser: 0.79 mg/dL (ref 0.61–1.24)
GFR, Estimated: >60 mL/min (ref >60)
Glucose, Bld: 128 mg/dL — ABNORMAL HIGH (ref 70–99)
Potassium: 4.5 mmol/L (ref 3.5–5.1)
Sodium: 137 mmol/L (ref 135–145)

## 2024-08-26 LAB — HEPATIC FUNCTION PANEL
ALT: 21 U/L (ref 0–44)
AST: 35 U/L (ref 15–41)
Albumin: 3.5 g/dL (ref 3.5–5.0)
Alkaline Phosphatase: 128 U/L — ABNORMAL HIGH (ref 38–126)
Bilirubin, Direct: 0.4 mg/dL — ABNORMAL HIGH (ref 0.0–0.2)
Indirect Bilirubin: 0.4 mg/dL (ref 0.3–0.9)
Total Bilirubin: 0.8 mg/dL (ref 0.0–1.2)
Total Protein: 6.8 g/dL (ref 6.5–8.1)

## 2024-08-26 LAB — I-STAT CG4 LACTIC ACID, ED
Lactic Acid, Venous: 2.2 mmol/L (ref 0.5–1.9)
Lactic Acid, Venous: 1.4 mmol/L (ref 0.5–1.9)

## 2024-08-26 LAB — GLUCOSE, CAPILLARY: Glucose-Capillary: 140 mg/dL — ABNORMAL HIGH (ref 70–99)

## 2024-08-26 LAB — HIV ANTIBODY (ROUTINE TESTING W REFLEX): HIV Screen 4th Generation wRfx: NONREACTIVE

## 2024-08-26 MED ORDER — POLYETHYLENE GLYCOL 3350 17 G PO PACK: 17.0000 g | PACK | Freq: Every day | ORAL | Status: DC | PRN

## 2024-08-26 MED ORDER — SODIUM CHLORIDE 0.9 % IV SOLN
1.0000 g | Freq: Once | INTRAVENOUS | Status: AC
  Administered 2024-08-26: 16:00:00 1 g via INTRAVENOUS
  Filled 2024-08-26: qty 10

## 2024-08-26 MED ORDER — ALPRAZOLAM 0.25 MG PO TABS
0.2500 mg | ORAL_TABLET | Freq: Every day | ORAL | Status: DC
  Administered 2024-08-26 – 2024-08-27 (×2): 0.25 mg via ORAL
  Filled 2024-08-26 (×2): qty 1

## 2024-08-26 MED ORDER — OXYCODONE-ACETAMINOPHEN 5-325 MG PO TABS
2.0000 | ORAL_TABLET | Freq: Once | ORAL | Status: AC
  Administered 2024-08-26: 16:00:00 2 via ORAL
  Filled 2024-08-26: qty 2

## 2024-08-26 MED ORDER — SODIUM CHLORIDE 0.9 % IV SOLN: 1.0000 g | INTRAVENOUS | Status: DC

## 2024-08-26 MED ORDER — VANCOMYCIN HCL IN DEXTROSE 1-5 GM/200ML-% IV SOLN
1000.0000 mg | Freq: Two times a day (BID) | INTRAVENOUS | Status: DC
  Administered 2024-08-27: 1000 mg via INTRAVENOUS
  Filled 2024-08-26: qty 200

## 2024-08-26 MED ORDER — QUETIAPINE FUMARATE 100 MG PO TABS
100.0000 mg | ORAL_TABLET | Freq: Every day | ORAL | Status: DC
  Administered 2024-08-26 – 2024-08-27 (×2): 100 mg via ORAL
  Filled 2024-08-26 (×2): qty 1

## 2024-08-26 MED ORDER — VANCOMYCIN HCL 1500 MG/300ML IV SOLN
1500.0000 mg | Freq: Once | INTRAVENOUS | Status: AC
  Administered 2024-08-26: 17:00:00 1500 mg via INTRAVENOUS
  Filled 2024-08-26: qty 300

## 2024-08-26 MED ORDER — ALBUTEROL SULFATE (2.5 MG/3ML) 0.083% IN NEBU: 2.5000 mg | INHALATION_SOLUTION | Freq: Four times a day (QID) | RESPIRATORY_TRACT | Status: DC | PRN

## 2024-08-26 MED ORDER — GABAPENTIN 400 MG PO CAPS: 400.0000 mg | ORAL_CAPSULE | Freq: Two times a day (BID) | ORAL | Status: DC | PRN

## 2024-08-26 MED ORDER — SODIUM CHLORIDE 0.9% FLUSH
3.0000 mL | Freq: Two times a day (BID) | INTRAVENOUS | Status: DC
  Administered 2024-08-26 – 2024-08-28 (×4): 3 mL via INTRAVENOUS

## 2024-08-26 MED ORDER — ACETAMINOPHEN 325 MG PO TABS: 650.0000 mg | ORAL_TABLET | Freq: Four times a day (QID) | ORAL | Status: DC | PRN

## 2024-08-26 MED ORDER — LACTATED RINGERS IV BOLUS
1000.0000 mL | Freq: Once | INTRAVENOUS | Status: AC
  Administered 2024-08-26: 17:00:00 1000 mL via INTRAVENOUS

## 2024-08-26 MED ORDER — ACETAMINOPHEN 650 MG RE SUPP: 650.0000 mg | Freq: Four times a day (QID) | RECTAL | Status: DC | PRN

## 2024-08-26 MED ORDER — OXYCODONE-ACETAMINOPHEN 5-325 MG PO TABS
1.0000 | ORAL_TABLET | Freq: Four times a day (QID) | ORAL | Status: DC | PRN
  Administered 2024-08-26 – 2024-08-27 (×2): 1 via ORAL
  Filled 2024-08-26 (×2): qty 1

## 2024-08-26 MED ORDER — METFORMIN HCL 500 MG PO TABS
500.0000 mg | ORAL_TABLET | Freq: Two times a day (BID) | ORAL | Status: DC
  Administered 2024-08-26 – 2024-08-28 (×4): 500 mg via ORAL
  Filled 2024-08-26 (×4): qty 1

## 2024-08-26 NOTE — ED Triage Notes (Signed)
 Pt to er, pt states that he has been trying to save his big toe, states that his foot doctor is afraid that the infection is going to get into his bone, states that he is here to have his big toe amputated.

## 2024-08-26 NOTE — Progress Notes (Signed)
 ABI exam has been completed.   Results can be found under chart review under CV PROC. 08/26/2024 3:19 PM Gerry Blanchfield RVT, RDMS

## 2024-08-26 NOTE — Progress Notes (Signed)
 ED Pharmacy Antibiotic Sign Off An antibiotic consult was received from an ED provider for vancomycin  per pharmacy dosing for wound infection. A chart review was completed to assess appropriateness.   The following one time order(s) were placed:  Vancomycin  1500 mg IV x 1  Further antibiotic and/or antibiotic pharmacy consults should be ordered by the admitting provider if indicated.   Thank you for allowing pharmacy to be a part of this patient's care.   Dorn Poot, [facility]  Clinical Pharmacist 08/26/2024 4:04 PM

## 2024-08-26 NOTE — ED Triage Notes (Signed)
 Pt sent from doctors office, pt has had ongoing infection to right big toe that has been ongoing for months. DR sent here to be put on IV antibiotics and amputation. HX diabetes

## 2024-08-26 NOTE — H&P (Signed)
 History and Physical   [name] FMW:[id number] DOB: 09-12-1965 DOA: 08/26/2024  PCP: Vick Lurie, FNP (Inactive)   Patient coming from: Podiatry office  Chief Complaint: Wound infection  HPI: Danny [name] is a 58 y.o. male with medical history significant of hypertension, GERD, diabetes, CVA, CKD 3A, PAD, hepatitis C, cirrhosis, HCC, neuropathy, anxiety, COPD presenting with foot wound infection.  Patient injured his right foot several months ago and has been following with podiatry for this.  About 3 weeks ago he noticed increased swelling and redness at the site concerning for infection.  He followed up with podiatry today and they are concerned for deeper infection as the nail came off on his bandage was removed exposing purulent drainage.  Patient was sent to the ED for IV antibiotics, imaging, likely amputation.  Patient denies fevers, chills, chest pain, shortness breath, abdominal pain, constipation, diarrhea, nausea, vomiting.  ED Course: Vital signs in the ED notable for heart rate in the 90s-100s, blood pressure in the 120s-150 systolic.  Lab workup included BMP with glucose 128.  CBC with platelets 1.9.  Lactic acid 2.2, improved to normal repeat.  ABIs normal bilaterally.  CT right foot pending.  Patient received ceftriaxone , vancomycin , Xanax , oxycodone  in the ED.  He also received 1 L IV fluids.  Case discussed with podiatry here who plan for OR tomorrow.  EDP reports that podiatrist on-call was okay with no MRI.  Review of Systems: As per HPI otherwise all other systems reviewed and are negative.  Past Medical History:  Diagnosis Date   Alcohol abuse    Anxiety    Back pain    Cancer (HCC)    Cirrhosis (HCC)    CVA (cerebral infarction)    Depression    Diabetes mellitus    Hepatitis C    Hypertension    Migraines    Stroke [facility])     Past Surgical History:  Procedure Laterality Date   ABDOMINAL AORTOGRAM W/LOWER EXTREMITY Right 05/06/2022    Procedure: ABDOMINAL AORTOGRAM W/LOWER EXTREMITY;  Surgeon: Sheree Penne Bruckner, MD;  Location: [facility] INVASIVE CV LAB;  Service: Cardiovascular;  Laterality: Right;   CAST APPLICATION Right 11/02/2021   Procedure: CAST APPLICATION;  Surgeon: Margrette Taft BRAVO, MD;  Location: [facility] ORS;  Service: Orthopedics;  Laterality: Right;   ESOPHAGOGASTRODUODENOSCOPY (EGD) WITH PROPOFOL  N/A 09/16/2022   Procedure: ESOPHAGOGASTRODUODENOSCOPY (EGD) WITH PROPOFOL ;  Surgeon: Cindie Carlin POUR, DO;  Location: [facility] ENDO SUITE;  Service: Endoscopy;  Laterality: N/A;  12:30pm, asa 3, pt knows to arrive at 6:45   FOOT SURGERY     INCISION AND DRAINAGE Right 11/02/2021   Procedure: INCISION AND DRAINAGE RIGHT KNEE;  Surgeon: Margrette Taft BRAVO, MD;  Location: [facility] ORS;  Service: Orthopedics;  Laterality: Right;   IR 3D INDEPENDENT WKST  10/11/2021   IR 3D INDEPENDENT WKST  02/28/2022   IR 3D INDEPENDENT WKST  03/14/2022   IR 3D INDEPENDENT WKST  12/18/2023   IR 3D INDEPENDENT WKST  12/18/2023   IR 3D INDEPENDENT WKST  12/18/2023   IR ANGIOGRAM SELECTIVE EACH ADDITIONAL VESSEL  10/11/2021   IR ANGIOGRAM SELECTIVE EACH ADDITIONAL VESSEL  10/11/2021   IR ANGIOGRAM SELECTIVE EACH ADDITIONAL VESSEL  10/11/2021   IR ANGIOGRAM SELECTIVE EACH ADDITIONAL VESSEL  10/11/2021   IR ANGIOGRAM SELECTIVE EACH ADDITIONAL VESSEL  10/11/2021   IR ANGIOGRAM SELECTIVE EACH ADDITIONAL VESSEL  10/11/2021   IR ANGIOGRAM SELECTIVE EACH ADDITIONAL VESSEL  10/25/2021   IR ANGIOGRAM SELECTIVE EACH ADDITIONAL VESSEL  10/25/2021   IR ANGIOGRAM SELECTIVE EACH ADDITIONAL VESSEL  02/28/2022   IR ANGIOGRAM SELECTIVE EACH ADDITIONAL VESSEL  02/28/2022   IR ANGIOGRAM SELECTIVE EACH ADDITIONAL VESSEL  02/28/2022   IR ANGIOGRAM SELECTIVE EACH ADDITIONAL VESSEL  03/14/2022   IR ANGIOGRAM SELECTIVE EACH ADDITIONAL VESSEL  12/18/2023   IR ANGIOGRAM SELECTIVE EACH ADDITIONAL VESSEL  12/18/2023   IR ANGIOGRAM VISCERAL SELECTIVE  10/11/2021   IR ANGIOGRAM VISCERAL SELECTIVE   02/28/2022   IR ANGIOGRAM VISCERAL SELECTIVE  03/14/2022   IR ANGIOGRAM VISCERAL SELECTIVE  12/18/2023   IR EMBO TUMOR ORGAN ISCHEMIA INFARCT INC GUIDE ROADMAPPING  10/25/2021   IR EMBO TUMOR ORGAN ISCHEMIA INFARCT INC GUIDE ROADMAPPING  10/25/2021   IR EMBO TUMOR ORGAN ISCHEMIA INFARCT INC GUIDE ROADMAPPING  03/14/2022   IR EMBO TUMOR ORGAN ISCHEMIA INFARCT INC GUIDE ROADMAPPING  12/18/2023   IR RADIOLOGIST EVAL & MGMT  08/23/2021   IR RADIOLOGIST EVAL & MGMT  11/21/2021   IR RADIOLOGIST EVAL & MGMT  01/24/2022   IR RADIOLOGIST EVAL & MGMT  04/09/2022   IR RADIOLOGIST EVAL & MGMT  08/05/2022   IR RADIOLOGIST EVAL & MGMT  11/29/2022   IR RADIOLOGIST EVAL & MGMT  06/11/2023   IR RADIOLOGIST EVAL & MGMT  11/17/2023   IR RADIOLOGIST EVAL & MGMT  02/06/2024   IR RADIOLOGIST EVAL & MGMT  07/05/2024   IR US  GUIDE VASC ACCESS RIGHT  10/11/2021   IR US  GUIDE VASC ACCESS RIGHT  10/25/2021   IR US  GUIDE VASC ACCESS RIGHT  02/28/2022   IR US  GUIDE VASC ACCESS RIGHT  03/14/2022   IR US  GUIDE VASC ACCESS RIGHT  12/18/2023   MANDIBLE SURGERY     PERIPHERAL VASCULAR INTERVENTION Right 05/06/2022   Procedure: PERIPHERAL VASCULAR INTERVENTION;  Surgeon: Sheree Penne Bruckner, MD;  Location: [facility] INVASIVE CV LAB;  Service: Cardiovascular;  Laterality: Right;  SFA   QUADRICEPS TENDON REPAIR Right 09/04/2021   Procedure: REPAIR QUADRICEP TENDON;  Surgeon: Margrette Taft BRAVO, MD;  Location: [facility] ORS;  Service: Orthopedics;  Laterality: Right;   TENDON EXPLORATION Right 11/02/2021   Procedure: QUADRICEPS TENDON AND PATELLAR TENDON EXPLORATION;  Surgeon: Margrette Taft BRAVO, MD;  Location: [facility] ORS;  Service: Orthopedics;  Laterality: Right;    Social History  reports that he has been smoking cigarettes. He has a 10 pack-year smoking history. He has been exposed to tobacco smoke. He has never used smokeless tobacco. He reports that he does not currently use alcohol. He reports that he does not currently use drugs after having used  the following drugs: Marijuana.  Allergies[1]  Family History  Problem Relation Age of Onset   Heart failure Mother    Stroke Father    Stroke Sister    Cancer Sister        breast cancer   Colon cancer Neg Hx    Liver cancer Neg Hx   Reviewed on admission  Prior to Admission medications  Medication Sig Start Date End Date Taking? Authorizing Provider  albuterol  (VENTOLIN  HFA) 108 (90 Base) MCG/ACT inhaler Inhale 2 puffs into the lungs every 6 (six) hours as needed for wheezing or shortness of breath.    [provider]  ALPRAZolam  (XANAX ) 0.25 MG tablet Take 0.25 mg by mouth at bedtime.    [provider]  amoxicillin -clavulanate (AUGMENTIN ) 875-125 MG tablet Take 1 tablet by mouth 2 (two) times daily. 05/27/24   Hyatt, Max T, DPM  Cholecalciferol  (VITAMIN D -3) 125 MCG (5000 UT) TABS  Take 5,000 Units by mouth daily.    [provider]  citalopram  (CELEXA ) 20 MG tablet Take 20 mg by mouth daily.    [provider]  cyclobenzaprine  (FLEXERIL ) 5 MG tablet Take 5 mg by mouth daily as needed for muscle spasms. 03/06/23   [provider]  gabapentin  (NEURONTIN ) 400 MG capsule Take 400 mg by mouth 2 (two) times daily as needed. 04/17/24   [provider]  lidocaine -prilocaine  (EMLA ) cream Apply to affected area once 03/22/24   Davonna Siad, MD  loperamide  (IMODIUM ) 2 MG capsule Take 2 tablets by mouth after the first loose stool, then 1 tablet after each loose stool. Do not exceed 5 doses per day 05/25/24   Kandala, Hyndavi, MD  Melatonin 10 MG CAPS Take 10 mg by mouth at bedtime.    [provider]  metFORMIN  (GLUCOPHAGE ) 1000 MG tablet Take 1,000 mg by mouth 2 (two) times daily. 02/10/24   [provider]  mirtazapine (REMERON) 7.5 MG tablet Take 1 tablet every day by oral route for 90 days. 11/17/23   [provider]  mupirocin  ointment (BACTROBAN ) 2 % Apply 1 Application topically 2 (two) times daily. 05/27/24    Hyatt, Max T, DPM  naloxone (NARCAN) nasal spray 4 mg/0.1 mL Place 1 spray into the nose once. 01/01/22   [provider]  ondansetron  (ZOFRAN ) 8 MG tablet Take 1 tablet (8 mg total) by mouth every 8 (eight) hours as needed for nausea or vomiting. 03/22/24   Davonna Siad, MD  oxyCODONE -acetaminophen  (PERCOCET) 10-325 MG tablet Take 1 tablet by mouth every 4 (four) hours as needed for pain. 03/04/24   Kommor, Lum, MD  oxyCODONE -acetaminophen  (PERCOCET/ROXICET) 5-325 MG tablet Take 1 tablet by mouth every 6 (six) hours as needed for severe pain (pain score 7-10). 03/04/24   Kommor, Madison, MD  pantoprazole  (PROTONIX ) 40 MG tablet Take 1 tablet (40 mg total) by mouth daily. Patient not taking: Reported on 08/26/2024 05/07/22 06/23/24  Bethanie Cough, PA-C  prochlorperazine  (COMPAZINE ) 10 MG tablet Take 1 tablet (10 mg total) by mouth every 6 (six) hours as needed for nausea or vomiting. 03/22/24   Davonna Siad, MD  QUEtiapine  (SEROQUEL ) 100 MG tablet Take 100 mg by mouth at bedtime.    [provider]  rosuvastatin  (CRESTOR ) 10 MG tablet Take 1 tablet (10 mg total) by mouth daily. Patient not taking: Reported on 08/26/2024 05/06/22 06/23/24  Sheree Penne Bruckner, MD  Sofosbuvir-Velpatasvir 400-100 MG TABS Take 1 tablet by mouth daily.    [provider]    Physical Exam: Vitals:   08/26/24 1156 08/26/24 1208 08/26/24 1630  BP: 128/60  (!) 144/82  Pulse: 98  90  Resp: (!) 21  (!) 21  Temp: (!) 97.5 F (36.4 C)  97.9 F (36.6 C)  TempSrc:   Oral  SpO2: 98%  100%  Weight:  74.8 kg   Height:  5' 9 (1.753 m)     Physical Exam Constitutional:      General: He is not in acute distress.    Appearance: Normal appearance.  HENT:     Head: Normocephalic and atraumatic.     Mouth/Throat:     Mouth: Mucous membranes are moist.     Pharynx: Oropharynx is clear.  Eyes:     Extraocular Movements: Extraocular movements intact.     Pupils: Pupils are equal,  round, and reactive to light.  Cardiovascular:     Rate and Rhythm: Normal rate and regular rhythm.  Pulses: Normal pulses.     Heart sounds: Normal heart sounds.  Pulmonary:     Effort: Pulmonary effort is normal. No respiratory distress.     Breath sounds: Normal breath sounds.  Abdominal:     General: Bowel sounds are normal. There is no distension.     Palpations: Abdomen is soft.     Tenderness: There is no abdominal tenderness.  Musculoskeletal:        General: No swelling or deformity.     Comments: R Foot dressed and wrapped during my exam.  Skin:    General: Skin is warm and dry.  Neurological:     General: No focal deficit present.     Mental Status: Mental status is at baseline.     Labs on Admission: I have personally reviewed following labs and imaging studies  CBC: Recent Labs  Lab 08/26/24 1218  WBC 6.4  NEUTROABS 4.6  HGB 13.9  HCT 41.8  MCV 93.1  PLT 149*    Basic Metabolic Panel: Recent Labs  Lab 08/26/24 1218  NA 137  K 4.5  CL 102  CO2 24  GLUCOSE 128*  BUN 9  CREATININE 0.79  CALCIUM  9.7    GFR: Estimated Creatinine Clearance: 100.6 mL/min (by C-G formula based on SCr of 0.79 mg/dL).  Liver Function Tests: No results for input(s): AST, ALT, ALKPHOS, BILITOT, PROT, ALBUMIN  in the last 168 hours.  Urine analysis:    Component Value Date/Time   COLORURINE STRAW (A) 11/10/2023 2223   APPEARANCEUR CLEAR 11/10/2023 2223   LABSPEC 1.003 (L) 11/10/2023 2223   PHURINE 6.0 11/10/2023 2223   GLUCOSEU 150 (A) 11/10/2023 2223   HGBUR NEGATIVE 11/10/2023 2223   BILIRUBINUR NEGATIVE 11/10/2023 2223   KETONESUR NEGATIVE 11/10/2023 2223   PROTEINUR NEGATIVE 11/10/2023 2223   UROBILINOGEN 0.2 04/03/2015 1600   NITRITE NEGATIVE 11/10/2023 2223   LEUKOCYTESUR NEGATIVE 11/10/2023 2223    Radiological Exams on Admission: VAS US  ABI WITH/WO TBI Result Date: 08/26/2024  LOWER EXTREMITY DOPPLER STUDY Patient Name:  MAXAMILIAN AMADON  Date of Exam:   08/26/2024 Medical Rec #: [id number]         Accession #:    [id number] Date of Birth: 1966-01-28         Patient Gender: M Patient Age:   39 years Exam Location:  [facility] Procedure:      VAS US  ABI WITH/WO TBI Referring Phys: MARSA HONOUR --------------------------------------------------------------------------------  Indications: Ulceration. High Risk Factors: Hypertension, hyperlipidemia, Diabetes, current smoker, prior                    CVA. Other Factors: CKD, COPD.  Vascular Interventions: Right SFA stenting (6 x 100 mm Elluvia) 05/06/2022. Comparison Study: previous exam was on 09/19/2022 Performing Technologist: Leigh Rom RVT/RDMS  Examination Guidelines: A complete evaluation includes at minimum, Doppler waveform signals and systolic blood pressure reading at the level of bilateral brachial, anterior tibial, and posterior tibial arteries, when vessel segments are accessible. Bilateral testing is considered an integral part of a complete examination. Photoelectric Plethysmograph (PPG) waveforms and toe systolic pressure readings are included as required and additional duplex testing as needed. Limited examinations for reoccurring indications may be performed as noted.  ABI Findings: +---------+------------------+-----+--------------+-------------------+ Right    Rt Pressure (mmHg)IndexWaveform      Comment             +---------+------------------+-----+--------------+-------------------+ Brachial 147  triphasic                         +---------+------------------+-----+--------------+-------------------+ PTA      145               0.93 triphasic                         +---------+------------------+-----+--------------+-------------------+ DP       148               0.95 triphasic                         +---------+------------------+-----+--------------+-------------------+ Great Toe                       not  visualizedopen wound, bandage +---------+------------------+-----+--------------+-------------------+ +---------+------------------+-----+---------+-------+ Left     Lt Pressure (mmHg)IndexWaveform Comment +---------+------------------+-----+---------+-------+ Brachial 156                    triphasic        +---------+------------------+-----+---------+-------+ PTA      160               1.03 triphasic        +---------+------------------+-----+---------+-------+ DP       164               1.05 triphasic        +---------+------------------+-----+---------+-------+ Great Toe141               0.90 Normal           +---------+------------------+-----+---------+-------+   Summary: Right: Resting right ankle-brachial index is within normal range.  Left: Resting left ankle-brachial index is within normal range. The left toe-brachial index is normal.  *See table(s) above for measurements and observations.  Electronically signed by Fonda Rim on 08/26/2024 at 4:40:58 PM.    Final    EKG: Not performed in the emergency department  Assessment/Plan Principal Problem:   Wound infection Active Problems:   HLD (hyperlipidemia)   Hepatitis C   Hepatocellular carcinoma (HCC)   COPD (chronic obstructive pulmonary disease) (HCC)   Other cirrhosis of liver (HCC)   Type 2 diabetes mellitus (HCC)   PAD (peripheral artery disease)   Generalized anxiety disorder   Neuropathy due to type 2 diabetes mellitus (HCC)   Stage 3a chronic kidney disease (HCC)   Wound infection > Sent from podiatrist office with concern for deeper wound of right foot.  Nail came off exposing purulent drainage. > Initially injured foot 3 months ago-follow podiatry noticed worsening infection changes about 3 weeks ago and followed up with podiatry today. > Started on ceftriaxone  and vancomycin  in the ED, podiatry on-call consulted and plan for OR evaluation tomorrow. - Monitor on telemetry - Continue with  vancomycin  and ceftriaxone  - Appreciate podiatry recommendations assistance - As needed pain medication - Supportive care - N.p.o. at midnight - Trend fever curve and WBC  Hypertension - Not currently on medication for this  Diabetes - Continue metformin   History of hepatitis C Cirrhosis HCC > History of hepatitis C status post treatment/cure with Epclusa > Known history of cirrhosis and hepatocellular carcinoma.  HCC initially diagnosed in 2022 status post chemo, immunotherapy.  Continues to follow with oncology - Add on LFTs and trend  CKD 3a > Creatinine stable - Trend renal function and electrolytes  PAD History of CVA - No longer  taking statin  Neuropathy - Continue gabapentin   COPD - Continue as needed albuterol   Anxiety - Continue home Xanax , Seroquel    DVT prophylaxis: SCDs Code Status:   Full Family Communication:  None on admission  Disposition Plan:   Patient is from:  Home  Anticipated DC to:  Home  Anticipated DC date:  2 to 4 days  Anticipated DC barriers: None  Consults called:  Podiatry Admission status:  Inpatient, telemetry  Severity of Illness: The appropriate patient status for this patient is INPATIENT. Inpatient status is judged to be reasonable and necessary in order to provide the required intensity of service to ensure the patient's safety. The patient's presenting symptoms, physical exam findings, and initial radiographic and laboratory data in the context of their chronic comorbidities is felt to place them at high risk for further clinical deterioration. Furthermore, it is not anticipated that the patient will be medically stable for discharge from the hospital within 2 midnights of admission.   * I certify that at the point of admission it is my clinical judgment that the patient will require inpatient hospital care spanning beyond 2 midnights from the point of admission due to high intensity of service, high risk for further  deterioration and high frequency of surveillance required.DEWAINE Marsa KATHEE Seena MD [facility]  How to contact the [facility] Attending or Consulting provider 7A - 7P or covering provider during after hours 7P -7A, for this patient?   Check the care team in [facility] and look for a) attending/consulting [facility] provider listed and b) the [facility] team listed Log into [email or web address] and use [place]'s universal password to access. If you do not have the password, please contact the hospital operator. Locate the [facility] provider you are looking for under [facility] and page to a number that you can be directly reached. If you still have difficulty reaching the provider, please page the [facility] (Director on Call) for the Hospitalists listed on amion for assistance.  08/26/2024, 5:20 PM       [1]  Allergies Allergen Reactions   Trazodone Other (See Comments)    Caused him to sleep walk   Benadryl  [Diphenhydramine ] Itching and Rash   Ultram  [Tramadol ] Hives

## 2024-08-26 NOTE — ED Provider Triage Note (Cosign Needed)
 Emergency Medicine Provider Triage Evaluation Note  Danny [name] , a 58 y.o. male  was evaluated in triage.  Pt was sent from podiatry office for concerns of right big toe amputation.  Patient has terminal cancer and is no longer on treatment.  Patient denies any other associated symptoms and says he feels fine.  He has already had x-ray at podiatry office, CT will be ordered for further evaluation.  Review of Systems  Positive: Right toe infection Negative: Chest pain, shortness of breath, fever, dizziness, headache, chills  Physical Exam  BP 128/60 (BP Location: Right Arm)   Pulse 98   Temp (!) 97.5 F (36.4 C)   Resp (!) 21   Ht 5' 9 (1.753 m)   Wt 74.8 kg   SpO2 98%   BMI 24.37 kg/m  Gen:   Awake, no distress   Resp:  Normal effort lungs are clear to auscultation MSK:   There is a large bandage around the right to place by podiatry. Other:    Medical Decision Making  Medically screening exam initiated at 12:28 PM.  Appropriate orders placed.  Vinie LITTIE Clack was informed that the remainder of the evaluation will be completed by another provider, this initial triage assessment does not replace that evaluation, and the importance of remaining in the ED until their evaluation is complete.     Myriam Fonda RAMAN, [place] 08/26/24 1249

## 2024-08-26 NOTE — Progress Notes (Signed)
 Patient presented today with worsening infection of his right hallux, family states this acutely worsened over the last few days, bandage was removed, the nail came off and exposed severe infection.  On exam cellulitic with exposed bone and infection x-rays show osteolysis of distal phalanx advised him to proceed to the ER for admission for IV antibiotics, MRI and hallux amputation, will travel by car as he is hemodynamically stable and his family can transport immediately.   BP (!) 157/73 (BP Location: Right Arm, Patient Position: Sitting, Cuff Size: Normal)   Pulse (!) 101   Temp (!) 95.5 F (35.3 C) (Oral)   Ht 5' 9 (1.753 m)   Wt 170 lb (77.1 kg)   SpO2 98%   BMI 25.10 kg/m

## 2024-08-26 NOTE — ED Provider Notes (Signed)
 [place] EMERGENCY DEPARTMENT AT [facility] Provider Note   CSN: [id number] Arrival date & time: 08/26/24  1150     Patient presents with: Foot Pain   DARROLL BREDESON is a 58 y.o. male.   Patient is a 58 year old male with a past medical history of diabetes, hepatocarcinoma not currently on any chemo or radiation, CKD, PAD presenting to the emergency department with diabetic foot wound.  The patient states that he injured his foot about 3 months ago and has been following with podiatry to treat the wound.  He states over the last few weeks it started to look more swollen and infected.  He states that he went to the podiatrist this morning who stated that he needed to come to the emergency department for IV antibiotics and amputation tomorrow.  He states that he has had no fevers, nausea or vomiting.  The history is provided by the patient and a relative.  Foot Pain       Prior to Admission medications  Medication Sig Start Date End Date Taking? Authorizing Provider  albuterol  (VENTOLIN  HFA) 108 (90 Base) MCG/ACT inhaler Inhale 2 puffs into the lungs every 6 (six) hours as needed for wheezing or shortness of breath.    [provider]  ALPRAZolam  (XANAX ) 0.25 MG tablet Take 0.25 mg by mouth at bedtime.    [provider]  amoxicillin -clavulanate (AUGMENTIN ) 875-125 MG tablet Take 1 tablet by mouth 2 (two) times daily. 05/27/24   Hyatt, Max T, DPM  Cholecalciferol  (VITAMIN D -3) 125 MCG (5000 UT) TABS Take 5,000 Units by mouth daily.    [provider]  citalopram  (CELEXA ) 20 MG tablet Take 20 mg by mouth daily.    [provider]  cyclobenzaprine  (FLEXERIL ) 5 MG tablet Take 5 mg by mouth daily as needed for muscle spasms. 03/06/23   [provider]  gabapentin  (NEURONTIN ) 400 MG capsule Take 400 mg by mouth 2 (two) times daily as needed. 04/17/24   [provider]  lidocaine -prilocaine  (EMLA ) cream Apply to affected area  once 03/22/24   Davonna Siad, MD  loperamide  (IMODIUM ) 2 MG capsule Take 2 tablets by mouth after the first loose stool, then 1 tablet after each loose stool. Do not exceed 5 doses per day 05/25/24   Kandala, Hyndavi, MD  Melatonin 10 MG CAPS Take 10 mg by mouth at bedtime.    [provider]  metFORMIN  (GLUCOPHAGE ) 1000 MG tablet Take 1,000 mg by mouth 2 (two) times daily. 02/10/24   [provider]  mirtazapine (REMERON) 7.5 MG tablet Take 1 tablet every day by oral route for 90 days. 11/17/23   [provider]  mupirocin  ointment (BACTROBAN ) 2 % Apply 1 Application topically 2 (two) times daily. 05/27/24   Hyatt, Max T, DPM  naloxone (NARCAN) nasal spray 4 mg/0.1 mL Place 1 spray into the nose once. 01/01/22   [provider]  ondansetron  (ZOFRAN ) 8 MG tablet Take 1 tablet (8 mg total) by mouth every 8 (eight) hours as needed for nausea or vomiting. 03/22/24   Davonna Siad, MD  oxyCODONE -acetaminophen  (PERCOCET) 10-325 MG tablet Take 1 tablet by mouth every 4 (four) hours as needed for pain. 03/04/24   Kommor, Madison, MD  oxyCODONE -acetaminophen  (PERCOCET/ROXICET) 5-325 MG tablet Take 1 tablet by mouth every 6 (six) hours as needed for severe pain (pain score 7-10). 03/04/24   Kommor, Madison, MD  pantoprazole  (PROTONIX ) 40 MG tablet Take 1 tablet (40 mg total) by mouth daily.  Patient not taking: Reported on 08/26/2024 05/07/22 06/23/24  Bethanie Cough, PA-C  prochlorperazine  (COMPAZINE ) 10 MG tablet Take 1 tablet (10 mg total) by mouth every 6 (six) hours as needed for nausea or vomiting. 03/22/24   Davonna Siad, MD  QUEtiapine  (SEROQUEL ) 100 MG tablet Take 100 mg by mouth at bedtime.    [provider]  rosuvastatin  (CRESTOR ) 10 MG tablet Take 1 tablet (10 mg total) by mouth daily. Patient not taking: Reported on 08/26/2024 05/06/22 06/23/24  Sheree Penne Bruckner, MD  Sofosbuvir-Velpatasvir 400-100 MG TABS Take 1 tablet by mouth daily.     [provider]    Allergies: Trazodone, Benadryl  [diphenhydramine ], and Ultram  [tramadol ]    Review of Systems  Updated Vital Signs BP 128/60 (BP Location: Right Arm)   Pulse 98   Temp (!) 97.5 F (36.4 C)   Resp (!) 21   Ht 5' 9 (1.753 m)   Wt 74.8 kg   SpO2 98%   BMI 24.37 kg/m   Physical Exam Vitals and nursing note reviewed.  Constitutional:      General: He is not in acute distress.    Appearance: Normal appearance.  HENT:     Head: Normocephalic and atraumatic.     Nose: Nose normal.     Mouth/Throat:     Mouth: Mucous membranes are moist.  Eyes:     Extraocular Movements: Extraocular movements intact.     Conjunctiva/sclera: Conjunctivae normal.  Cardiovascular:     Rate and Rhythm: Normal rate and regular rhythm.     Pulses: Normal pulses.     Heart sounds: Normal heart sounds.  Pulmonary:     Effort: Pulmonary effort is normal.     Breath sounds: Normal breath sounds.  Abdominal:     General: Abdomen is flat.     Palpations: Abdomen is soft.     Tenderness: There is no abdominal tenderness.  Musculoskeletal:        General: Normal range of motion.     Cervical back: Normal range of motion.  Skin:    General: Skin is warm and dry.     Comments: Necrosis to the tip of the right first toe with missing toenail, surrounding erythema, warmth and purulent drainage  Neurological:     General: No focal deficit present.     Mental Status: He is alert and oriented to person, place, and time.  Psychiatric:        Mood and Affect: Mood normal.     (all labs ordered are listed, but only abnormal results are displayed) Labs Reviewed  CBC WITH DIFFERENTIAL/PLATELET - Abnormal; Notable for the following components:      Result Value   Platelets 149 (*)    All other components within normal limits  BASIC METABOLIC PANEL WITH GFR - Abnormal; Notable for the following components:   Glucose, Bld 128 (*)    All other components within normal limits   I-STAT CG4 LACTIC ACID, ED - Abnormal; Notable for the following components:   Lactic Acid, Venous 2.2 (*)    All other components within normal limits  I-STAT CG4 LACTIC ACID, ED    EKG: None  Radiology: VAS US  ABI WITH/WO TBI Result Date: 08/26/2024  LOWER EXTREMITY DOPPLER STUDY Patient Name:  TAYSHUN GAPPA  Date of Exam:   08/26/2024 Medical Rec #: [id number]         Accession #:    [id number] Date of Birth: 07/19/1966  Patient Gender: M Patient Age:   30 years Exam Location:  [facility] Procedure:      VAS US  ABI WITH/WO TBI Referring Phys: MARSA STANDIFORD --------------------------------------------------------------------------------  Indications: Ulceration. High Risk Factors: Hypertension, hyperlipidemia, Diabetes, current smoker, prior                    CVA. Other Factors: CKD, COPD.  Vascular Interventions: Right SFA stenting (6 x 100 mm Elluvia) 05/06/2022. Comparison Study: previous exam was on 09/19/2022 Performing Technologist: Leigh Rom RVT/RDMS  Examination Guidelines: A complete evaluation includes at minimum, Doppler waveform signals and systolic blood pressure reading at the level of bilateral brachial, anterior tibial, and posterior tibial arteries, when vessel segments are accessible. Bilateral testing is considered an integral part of a complete examination. Photoelectric Plethysmograph (PPG) waveforms and toe systolic pressure readings are included as required and additional duplex testing as needed. Limited examinations for reoccurring indications may be performed as noted.  ABI Findings: +---------+------------------+-----+--------------+-------------------+ Right    Rt Pressure (mmHg)IndexWaveform      Comment             +---------+------------------+-----+--------------+-------------------+ Brachial 147                    triphasic                         +---------+------------------+-----+--------------+-------------------+ PTA       145               0.93 triphasic                         +---------+------------------+-----+--------------+-------------------+ DP       148               0.95 triphasic                         +---------+------------------+-----+--------------+-------------------+ Great Toe                       not visualizedopen wound, bandage +---------+------------------+-----+--------------+-------------------+ +---------+------------------+-----+---------+-------+ Left     Lt Pressure (mmHg)IndexWaveform Comment +---------+------------------+-----+---------+-------+ Brachial 156                    triphasic        +---------+------------------+-----+---------+-------+ PTA      160               1.03 triphasic        +---------+------------------+-----+---------+-------+ DP       164               1.05 triphasic        +---------+------------------+-----+---------+-------+ Great Toe141               0.90 Normal           +---------+------------------+-----+---------+-------+   Summary: Right: Resting right ankle-brachial index is within normal range.  Left: Resting left ankle-brachial index is within normal range. The left toe-brachial index is normal.  *See table(s) above for measurements and observations.     Preliminary      Procedures   Medications Ordered in the ED  cefTRIAXone  (ROCEPHIN ) 1 g in sodium chloride  0.9 % 100 mL IVPB (1 g Intravenous New Bag/Given 08/26/24 1610)  lactated ringers  bolus 1,000 mL (has no administration in time range)  ALPRAZolam  (XANAX ) tablet 0.25 mg (has no administration in time range)  vancomycin  (VANCOREADY) IVPB 1500 mg/300 mL (has no administration in time range)  oxyCODONE -acetaminophen  (PERCOCET/ROXICET) 5-325 MG per tablet 2 tablet (2 tablets Oral Given 08/26/24 1608)    Clinical Course as of 08/26/24 1630  Thu Aug 26, 2024  1629 I spoke with Dr. Malvin - do not need MRI, will plan for OR in the AM. [VK]    Clinical  Course User Index [VK] Ellouise Richerd POUR, DO                                 Medical Decision Making This patient presents to the ED with chief complaint(s) of infected toe wound with pertinent past medical history of diabetes, PAD, hepatocellular carcinoma not on treatment, CKD which further complicates the presenting complaint. The complaint involves an extensive differential diagnosis and also carries with it a high risk of complications and morbidity.    The differential diagnosis includes cellulitis, osteomyelitis, sepsis, hyperglycemic crisis  Additional history obtained: Additional history obtained from family Records reviewed outpatient podiatry records  ED Course and Reassessment: On patient's arrival he is hemodynamically stable in no acute distress.  He was initially evaluated in triage and had labs, ABIs and CTA of his foot performed.  ABI is within normal range.  He did have a mildly elevated lactate and otherwise labs are within normal range without other signs of sepsis.  Patient started on IV antibiotics, podiatry was consulted and the patient will be admitted to hospitalist for plan for OR tomorrow.  Independent labs interpretation:  The following labs were independently interpreted: mildly elevated lactic -> improved on repeat, otherwise labs within normal range  Independent visualization of imaging: - I independently visualized the following imaging with scope of interpretation limited to determining acute life threatening conditions related to emergency care: ABI RLE, which revealed normal flow  Consultation: - Consulted or discussed management/test interpretation w/ external professional: podiatry, hospitalist  Consideration for admission or further workup: patient requires admission for osteo Social Determinants of health: N/A    Risk Prescription drug management. Decision regarding hospitalization.       Final diagnoses:  None    ED Discharge  Orders     None          Kingsley, Cordarryl Monrreal K, [place] 08/26/24 1630

## 2024-08-26 NOTE — Progress Notes (Signed)
° °  Brief Progress Note   _____________________________________________________________________________________________________________  Patient Name: Danny [name] Patient DOB: March 16, 1966 Date: @TODAY @      Data: Reviewed vital signs, labs, and clinical notes.    Action: No action required at this time.    Response:  Osteo of right big toe. OR in am.  _____________________________________________________________________________________________________________  [facility] RN Expeditor Jeri Rawlins S Keeghan Bialy Please contact us  directly via secure chat (search for [facility]) or by calling us  at [phone] [facility]).

## 2024-08-26 NOTE — Progress Notes (Signed)
 Pharmacy Antibiotic Note  Danny [name] is a 58 y.o. male admitted on 08/26/2024 with wound infection.  Pharmacy has been consulted for vancomycin  dosing.  Plan: Vancomycin  1500 mg IV x1 given in ED, follow with vancomycin  1000 mg IV Q12h (eAUC ~420, goal AUC 400-550, Scr 0.8) Trend WBC, fever, renal function F/u cultures, clinical progress, levels as indicated De-escalate when able   Height: 5' 9 (175.3 cm) Weight: 74.8 kg (165 lb) IBW/kg (Calculated) : 70.7  Temp (24hrs), Avg:97 F (36.1 C), Min:95.5 F (35.3 C), Max:97.9 F (36.6 C)  Recent Labs  Lab 08/26/24 1218 08/26/24 1250 08/26/24 1609  WBC 6.4  --   --   CREATININE 0.79  --   --   LATICACIDVEN  --  2.2* 1.4    Estimated Creatinine Clearance: 100.6 mL/min (by C-G formula based on SCr of 0.79 mg/dL).     Thank you for allowing pharmacy to be a part of this patients care.  Shelba Collier, PharmD, BCPS Clinical Pharmacist

## 2024-08-27 ENCOUNTER — Inpatient Hospital Stay (HOSPITAL_COMMUNITY)

## 2024-08-27 ENCOUNTER — Encounter (HOSPITAL_COMMUNITY)
Admission: EM | Disposition: A | Discharge status: Home / Self Care | Payer: Self-pay | Source: Ambulatory Visit | Attending: Internal Medicine

## 2024-08-27 ENCOUNTER — Encounter (HOSPITAL_COMMUNITY): Payer: Self-pay | Admitting: Internal Medicine

## 2024-08-27 DIAGNOSIS — M86171 Other acute osteomyelitis, right ankle and foot: Secondary | ICD-10-CM

## 2024-08-27 DIAGNOSIS — I10 Essential (primary) hypertension: Secondary | ICD-10-CM | POA: Diagnosis not present

## 2024-08-27 DIAGNOSIS — L089 Local infection of the skin and subcutaneous tissue, unspecified: Secondary | ICD-10-CM | POA: Diagnosis not present

## 2024-08-27 DIAGNOSIS — E1169 Type 2 diabetes mellitus with other specified complication: Secondary | ICD-10-CM | POA: Diagnosis not present

## 2024-08-27 DIAGNOSIS — F17210 Nicotine dependence, cigarettes, uncomplicated: Secondary | ICD-10-CM | POA: Diagnosis not present

## 2024-08-27 DIAGNOSIS — M869 Osteomyelitis, unspecified: Secondary | ICD-10-CM

## 2024-08-27 DIAGNOSIS — T148XXA Other injury of unspecified body region, initial encounter: Secondary | ICD-10-CM | POA: Diagnosis not present

## 2024-08-27 HISTORY — PX: AMPUTATION TOE: SHX6595

## 2024-08-27 LAB — CBC
HCT: 36.9 % — ABNORMAL LOW (ref 39.0–52.0)
Hemoglobin: 12.4 g/dL — ABNORMAL LOW (ref 13.0–17.0)
MCH: 30.8 pg (ref 26.0–34.0)
MCHC: 33.6 g/dL (ref 30.0–36.0)
MCV: 91.6 fL (ref 80.0–100.0)
Platelets: 145 K/uL — ABNORMAL LOW (ref 150–400)
RBC: 4.03 MIL/uL — ABNORMAL LOW (ref 4.22–5.81)
RDW: 13.1 % (ref 11.5–15.5)
WBC: 4.4 K/uL (ref 4.0–10.5)
nRBC: 0 % (ref 0.0–0.2)

## 2024-08-27 LAB — COMPREHENSIVE METABOLIC PANEL WITH GFR
ALT: 20 U/L (ref 0–44)
AST: 35 U/L (ref 15–41)
Albumin: 3.3 g/dL — ABNORMAL LOW (ref 3.5–5.0)
Alkaline Phosphatase: 122 U/L (ref 38–126)
Anion gap: 11 (ref 5–15)
BUN: 10 mg/dL (ref 6–20)
CO2: 24 mmol/L (ref 22–32)
Calcium: 9.6 mg/dL (ref 8.9–10.3)
Chloride: 104 mmol/L (ref 98–111)
Creatinine, Ser: 0.74 mg/dL (ref 0.61–1.24)
GFR, Estimated: >60 mL/min
Glucose, Bld: 96 mg/dL (ref 70–99)
Potassium: 4.1 mmol/L (ref 3.5–5.1)
Sodium: 139 mmol/L (ref 135–145)
Total Bilirubin: 0.7 mg/dL (ref 0.0–1.2)
Total Protein: 6.5 g/dL (ref 6.5–8.1)

## 2024-08-27 LAB — SURGICAL PCR SCREEN
MRSA, PCR: NEGATIVE
Staphylococcus aureus: NEGATIVE

## 2024-08-27 LAB — GLUCOSE, CAPILLARY
Glucose-Capillary: 125 mg/dL — ABNORMAL HIGH (ref 70–99)
Glucose-Capillary: 108 mg/dL — ABNORMAL HIGH (ref 70–99)

## 2024-08-27 MED ORDER — SODIUM CHLORIDE 0.9 % IR SOLN
Status: DC | PRN
  Administered 2024-08-27: 1000 mL

## 2024-08-27 MED ORDER — AMISULPRIDE (ANTIEMETIC) 5 MG/2ML IV SOLN: 10.0000 mg | Freq: Once | INTRAVENOUS | Status: DC | PRN

## 2024-08-27 MED ORDER — LACTATED RINGERS IV SOLN: INTRAVENOUS | Status: DC | PRN

## 2024-08-27 MED ORDER — BUPIVACAINE HCL (PF) 0.5 % IJ SOLN
INTRAMUSCULAR | Status: AC
  Filled 2024-08-27: qty 30

## 2024-08-27 MED ORDER — LIDOCAINE HCL (PF) 1 % IJ SOLN
INTRAMUSCULAR | Status: DC | PRN
  Administered 2024-08-27: 5 mL

## 2024-08-27 MED ORDER — ACETAMINOPHEN 10 MG/ML IV SOLN: 1000.0000 mg | Freq: Once | INTRAVENOUS | Status: DC | PRN

## 2024-08-27 MED ORDER — LIDOCAINE HCL (PF) 1 % IJ SOLN
INTRAMUSCULAR | Status: AC
  Filled 2024-08-27: qty 30

## 2024-08-27 MED ORDER — INSULIN ASPART 100 UNIT/ML IJ SOLN: 0.0000 [IU] | INTRAMUSCULAR | Status: DC | PRN

## 2024-08-27 MED ORDER — LACTULOSE 10 GM/15ML PO SOLN: 20.0000 g | Freq: Every day | ORAL | Status: DC | PRN

## 2024-08-27 MED ORDER — HYDROMORPHONE HCL 1 MG/ML IJ SOLN
0.5000 mg | INTRAMUSCULAR | Status: DC | PRN
  Administered 2024-08-27 (×2): 0.5 mg via INTRAVENOUS
  Filled 2024-08-27 (×2): qty 0.5

## 2024-08-27 MED ORDER — OXYCODONE-ACETAMINOPHEN 5-325 MG PO TABS
1.0000 | ORAL_TABLET | ORAL | Status: DC | PRN
  Administered 2024-08-27 – 2024-08-28 (×4): 1 via ORAL
  Filled 2024-08-27 (×4): qty 1

## 2024-08-27 MED ORDER — PROPOFOL 10 MG/ML IV BOLUS
INTRAVENOUS | Status: AC
  Filled 2024-08-27: qty 20

## 2024-08-27 MED ORDER — ALPRAZOLAM 0.25 MG PO TABS: 0.2500 mg | ORAL_TABLET | Freq: Two times a day (BID) | ORAL | Status: DC | PRN

## 2024-08-27 MED ORDER — OXYCODONE HCL 5 MG PO TABS: 5.0000 mg | ORAL_TABLET | Freq: Once | ORAL | Status: DC | PRN

## 2024-08-27 MED ORDER — CEPHALEXIN 500 MG PO CAPS
500.0000 mg | ORAL_CAPSULE | Freq: Three times a day (TID) | ORAL | Status: DC
  Administered 2024-08-27 – 2024-08-28 (×3): 500 mg via ORAL
  Filled 2024-08-27 (×3): qty 1

## 2024-08-27 MED ORDER — PROPOFOL 10 MG/ML IV BOLUS
INTRAVENOUS | Status: DC | PRN
  Administered 2024-08-27: 30 mg via INTRAVENOUS

## 2024-08-27 MED ORDER — LACTATED RINGERS IV SOLN: INTRAVENOUS | Status: DC

## 2024-08-27 MED ORDER — CHLORHEXIDINE GLUCONATE 0.12 % MT SOLN: 15.0000 mL | Freq: Once | OROMUCOSAL | Status: AC

## 2024-08-27 MED ORDER — PROPOFOL 500 MG/50ML IV EMUL
INTRAVENOUS | Status: DC | PRN
  Administered 2024-08-27: 180 ug/kg/min via INTRAVENOUS

## 2024-08-27 MED ORDER — BUPIVACAINE HCL (PF) 0.5 % IJ SOLN
INTRAMUSCULAR | Status: DC | PRN
  Administered 2024-08-27: 5 mL

## 2024-08-27 MED ORDER — ORAL CARE MOUTH RINSE: 15.0000 mL | Freq: Once | OROMUCOSAL | Status: AC

## 2024-08-27 MED ORDER — CHLORHEXIDINE GLUCONATE 0.12 % MT SOLN
OROMUCOSAL | Status: AC
  Administered 2024-08-27: 15 mL via OROMUCOSAL
  Filled 2024-08-27: qty 15

## 2024-08-27 MED ORDER — FENTANYL CITRATE (PF) 100 MCG/2ML IJ SOLN
INTRAMUSCULAR | Status: DC | PRN
  Administered 2024-08-27 (×2): 25 ug via INTRAVENOUS

## 2024-08-27 MED ORDER — FENTANYL CITRATE (PF) 100 MCG/2ML IJ SOLN
INTRAMUSCULAR | Status: AC
  Filled 2024-08-27: qty 2

## 2024-08-27 MED ORDER — PHENYLEPHRINE HCL (PRESSORS) 10 MG/ML IV SOLN
INTRAVENOUS | Status: DC | PRN
  Administered 2024-08-27: 160 ug via INTRAVENOUS
  Administered 2024-08-27: 80 ug via INTRAVENOUS

## 2024-08-27 MED ORDER — VANCOMYCIN HCL 1000 MG IV SOLR
INTRAVENOUS | Status: AC
  Filled 2024-08-27: qty 20

## 2024-08-27 MED ORDER — SODIUM CHLORIDE 0.9 % IV SOLN
2.0000 g | INTRAVENOUS | Status: DC
  Administered 2024-08-27: 2 g via INTRAVENOUS
  Filled 2024-08-27: qty 20

## 2024-08-27 MED ORDER — CYCLOBENZAPRINE HCL 5 MG PO TABS
5.0000 mg | ORAL_TABLET | Freq: Every day | ORAL | Status: DC | PRN
  Administered 2024-08-28: 5 mg via ORAL
  Filled 2024-08-27: qty 1

## 2024-08-27 MED ORDER — OXYCODONE HCL 5 MG/5ML PO SOLN: 5.0000 mg | Freq: Once | ORAL | Status: DC | PRN

## 2024-08-27 MED ORDER — FENTANYL CITRATE (PF) 100 MCG/2ML IJ SOLN: 25.0000 ug | INTRAMUSCULAR | Status: DC | PRN

## 2024-08-27 MED ORDER — ONDANSETRON HCL 4 MG/2ML IJ SOLN: 4.0000 mg | Freq: Once | INTRAMUSCULAR | Status: DC | PRN

## 2024-08-27 SURGICAL SUPPLY — 1 items: NDL HYPO 25X1 1.5 SAFETY (NEEDLE) ×1 IMPLANT

## 2024-08-27 NOTE — Progress Notes (Signed)
 Orthopedic Tech Progress Note Patient Details:  Danny [name] 1966-08-18 [id number]  Ortho Devices Type of Ortho Device: Postop shoe/boot Ortho Device/Splint Location: RLE/fitted and removed Ortho Device/Splint Interventions: Ordered, Application, Adjustment   Post Interventions Patient Tolerated: Well Instructions Provided: Care of device, Adjustment of device  [name] 08/27/2024, 9:11 AM

## 2024-08-27 NOTE — Progress Notes (Signed)
Pt. Off the unit to OR.

## 2024-08-27 NOTE — Plan of Care (Signed)
   Problem: Education: Goal: Knowledge of General Education information will improve Description: Including pain rating scale, medication(s)/side effects and non-pharmacologic comfort measures Outcome: Progressing   Problem: Health Behavior/Discharge Planning: Goal: Ability to manage health-related needs will improve Outcome: Progressing   Problem: Activity: Goal: Risk for activity intolerance will decrease Outcome: Progressing

## 2024-08-27 NOTE — Op Note (Signed)
 Full Operative Report  Date of Operation: 7:23 AM, 08/27/2024   Patient: Danny [name] - 58 y.o. male  Surgeon: Malvin Marsa FALCON, DPM   Assistant: None  Diagnosis: Osteomyelitis right great toe  Procedure:  1. Amputation right great toe    Anesthesia: Anesthesia type not filed in the log.  No responsible provider has been recorded for the case.  Anesthesiologist: Colhoun, Lauraine DASEN, MD CRNA: Scherrie Mast, CRNA   Estimated Blood Loss: Minimal   Hemostasis: 1) Anatomical dissection, mechanical compression, electrocautery 2) No tourniquet was used  Implants: * No implants in log *  Materials: prolene 3-0  Injectables: 1) Pre-operatively: 10 cc of 50:50 mixture 1%lidocaine  plain and 0.5% marcaine  plain 2) Post-operatively: None   Specimens: - Pathology: Right hallux  - Microbiology: R great toe cultures    Antibiotics: IV antibiotics given per schedule on the floor  Drains: None  Complications: Patient tolerated the procedure well without complication.   Operative findings: As below in detailed report  Indications for Procedure: NASIAH LEHENBAUER presents to Malvin Mars[facility], [place] with a chief complaint of right great toe wound infection with osteomyelitis. The patient has failed conservative treatments of various modalities. At this time the patient has elected to proceed with surgical correction. All alternatives, risks, and complications of the procedures were thoroughly explained to the patient. Patient exhibits appropriate understanding of all discussion points and informed consent was signed and obtained in the chart with no guarantees to surgical outcome given or implied.  Description of Procedure: Patient was brought to the operating room. Patient remained on their hospital bed in the supine position. A surgical timeout was performed and all members of the operating room, the procedure, and the surgical site were identified. anesthesia occurred  as per anesthesia record. Local anesthetic as previously described was then injected about the operative field in a local infiltrative block.  The operative lower extremity as noted above was then prepped and draped in the usual sterile manner. The following procedure then began.  Attention was directed to the first digit on the RIGHT foot. A full-thickness incision encompassing the entire digit was made using a #15 blade. Dissection was carried down to bone. The toe was secured with a towel clamp, further dissected in its entirety, and disarticulated at the MPJ and passed to the back table as a gross specimen. This was then labled and sent to pathology. A deep tissue culture was obtained at this time with rongeur from the toe. The bone was noted to be soft and eroded, and consistent with osteomyelitis. All remaining necrotic and devitalized soft tissue structures were visualized and dissected away using sharp and dull dissection. Care was taken to protect all neurovascular structures throughout the dissection. All bleeders were cauterized as necessary. The area was then flushed with copious amounts of sterile saline. Then using the suture materials previously described, the site was closed in anatomic layers and the skin was well approximated under minimal tension.  The surgical site was then dressed with xeroform 4x4 kerlix and ace wrap. The patient tolerated both the procedure and anesthesia well with vital signs stable throughout. The patient was transferred in good condition and all vital signs stable  from the OR to recovery under the discretion of anesthesia.  Condition: Vital signs stable, neurovascular status unchanged from preoperative   Surgical plan:  Expect clean margin, good bleeding. WBAT in post op shoe. 5 days cephalexin  on discharge. Follow up next week Wed for first dsg change.  Stable for DC later today or tmrw am.   The patient will be WBAT in a post op shoe to the operative limb until  further instructed. The dressing is to remain clean, dry, and intact. Will continue to follow unless noted elsewhere.   Marsa Honour, DPM [facility]

## 2024-08-27 NOTE — Anesthesia Preprocedure Evaluation (Signed)
"                                    Anesthesia Evaluation  Patient identified by MRN, date of birth, ID band Patient awake    Reviewed: Allergy & Precautions, NPO status , Patient's Chart, lab work & pertinent test results  History of Anesthesia Complications Negative for: history of anesthetic complications  Airway Mallampati: II  TM Distance: >3 FB Neck ROM: Full    Dental  (+) Teeth Intact, Dental Advisory Given   Pulmonary COPD, Current Smoker   breath sounds clear to auscultation       Cardiovascular hypertension, + Peripheral Vascular Disease   Rhythm:Regular Rate:Normal     Neuro/Psych  Headaches  Anxiety Depression    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 CVA    GI/Hepatic ,GERD  ,,(+) Cirrhosis     substance abuse  alcohol use, Hepatitis - (currently on treatment), CHepatocellular Carcinoma (not currently on any treatment)   Endo/Other  diabetes, Type 2    Renal/GU Renal InsufficiencyRenal disease     Musculoskeletal Osteomyelitis of Great Toe   Abdominal   Peds  Hematology  (+) Blood dyscrasia, anemia Hgb 12.4, Plts 145K (08/27/24)   Anesthesia Other Findings   Reproductive/Obstetrics                              Anesthesia Physical Anesthesia Plan  ASA: 3  Anesthesia Plan: MAC   Post-op Pain Management:    Induction: Intravenous  PONV Risk Score and Plan: 1 and Ondansetron , Propofol  infusion and Treatment may vary due to age or medical condition  Airway Management Planned: Natural Airway and Simple Face Mask  Additional Equipment: None  Intra-op Plan:   Post-operative Plan: Extubation in OR  Informed Consent: I have reviewed the patients History and Physical, chart, labs and discussed the procedure including the risks, benefits and alternatives for the proposed anesthesia with the patient or authorized representative who has indicated his/her understanding and acceptance.     Dental advisory  given  Plan Discussed with: CRNA and Surgeon  Anesthesia Plan Comments:          Anesthesia Quick Evaluation  "

## 2024-08-27 NOTE — Hospital Course (Signed)
 58 y.o. male with medical history significant of hypertension, GERD, diabetes, CVA, CKD 3A, PAD, hepatitis C, cirrhosis, HCC, neuropathy, anxiety, COPD presenting with foot wound infection.   Patient injured his right foot several months ago and has been following with podiatry for this.  About 3 weeks ago he noticed increased swelling and redness at the site concerning for infection.  He followed up with podiatry today and they are concerned for deeper infection as the nail came off on his bandage was removed exposing purulent drainage.  Patient was sent to the ED for IV antibiotics, imaging, likely amputation.   Patient denies fevers, chills, chest pain, shortness breath, abdominal pain, constipation, diarrhea, nausea, vomiting.  Podiatry consulted

## 2024-08-27 NOTE — Consult Note (Signed)
 "  PODIATRY CONSULTATION  NAME Danny [name] MRN [id number] DOB 08-27-1966 DOA 08/26/2024   Reason for consult:  Chief Complaint  Patient presents with   Foot Pain    Attending/Consulting physician: CANDIE Pelt MD  History of present illness: Danny [name] is a 58 y.o. male with medical history significant of hypertension, GERD, diabetes, CVA, CKD 3A, PAD, hepatitis C, cirrhosis, HCC, neuropathy, anxiety, COPD presenting with foot wound infection.   See Dr. Melody note from yesterday sent from clinic due to severe infection R hallux. I discussed findings and plan for amputation of the right great toe and he agrees.  Past Medical History:  Diagnosis Date   Alcohol abuse    Anxiety    Back pain    Cancer (HCC)    Cirrhosis (HCC)    CVA (cerebral infarction)    Depression    Diabetes mellitus    Hepatitis C    Hypertension    Migraines    Stroke [facility])        Latest Ref Rng & Units 08/27/2024    4:19 AM 08/26/2024   12:18 PM 07/21/2024    7:56 AM  CBC  WBC 4.0 - 10.5 K/uL 4.4  6.4  3.9   Hemoglobin 13.0 - 17.0 g/dL 87.5  86.0  86.7   Hematocrit 39.0 - 52.0 % 36.9  41.8  39.4   Platelets 150 - 400 K/uL 145  149  95        Latest Ref Rng & Units 08/27/2024    4:19 AM 08/26/2024   12:18 PM 07/21/2024    7:56 AM  BMP  Glucose 70 - 99 mg/dL 96  871  870   BUN 6 - 20 mg/dL 10  9  8    Creatinine 0.61 - 1.24 mg/dL 9.25  9.20  9.11   Sodium 135 - 145 mmol/L 139  137  140   Potassium 3.5 - 5.1 mmol/L 4.1  4.5  3.9   Chloride 98 - 111 mmol/L 104  102  101   CO2 22 - 32 mmol/L 24  24  25    Calcium  8.9 - 10.3 mg/dL 9.6  9.7  9.5       Physical Exam: Lower Extremity Exam  R foot wrapped in dsg  From yesterday:    ASSESSMENT/PLAN OF CARE 58 y.o. 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   - NPO for OR  today for R hallux amputation, he agrees to proceed - ABI / PVR reviewed appears has suitable vascular flow for healing - Continue IV abx broad spectrum pending further culture data - Anticoagulation: Ok to continue/ resume post op - Wound care: none pre op - WB status: WBAT in post op shoe after surgery - Will be stable for DC from my standpoint later today or tomrrow, 5 days PO abx, follow up next week - Will continue to follow   Thank you for the consult.  Please contact me directly with any questions or concerns.           Danny [name], DPM [facility] / [facility]    [address]  [place], [place] [address]                Office [phone]  Fax [phone]     "

## 2024-08-27 NOTE — Progress Notes (Signed)
" °  Progress Note   Patient: Danny [name] FMW:[id number] DOB: [birth year] DOA: 08/26/2024     1 DOS: the patient was seen and examined on 08/27/2024   Brief hospital course: 58 y.o. male with medical history significant of hypertension, GERD, diabetes, CVA, CKD 3A, PAD, hepatitis C, cirrhosis, HCC, neuropathy, anxiety, COPD presenting with foot wound infection.   Patient injured his right foot several months ago and has been following with podiatry for this.  About 3 weeks ago he noticed increased swelling and redness at the site concerning for infection.  He followed up with podiatry today and they are concerned for deeper infection as the nail came off on his bandage was removed exposing purulent drainage.  Patient was sent to the ED for IV antibiotics, imaging, likely amputation.   Patient denies fevers, chills, chest pain, shortness breath, abdominal pain, constipation, diarrhea, nausea, vomiting.  Podiatry consulted  Assessment and Plan: Wound infection > Sent from podiatrist office with concern for deeper wound of right foot.  Nail came off exposing purulent drainage. > Initially injured foot 3 months ago-follow podiatry noticed worsening infection changes about 3 weeks ago and followed up with podiatry today. 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  HCC initially diagnosed in 2022 status post chemo, immunotherapy.  Continues to follow with oncology - LFT's stable   CKD 3a > Creatinine stable - Trend renal function and electrolytes   PAD History of CVA - No longer taking statin   Neuropathy -  Continue gabapentin    COPD - Continue as needed albuterol    Anxiety - Continue home Xanax , Seroquel        Subjective: Reporting post-op pain when seen this AM. Asking for home oxy  Physical Exam: Vitals:   08/27/24 0815 08/27/24 0830 08/27/24 0845 08/27/24 1452  BP: (!) 104/56 121/72 135/64 132/64  Pulse: 67 72 72 85  Resp: 10 15 16 16   Temp:   97.7 F (36.5 C) 98 F (36.7 C)  TempSrc:   Oral Oral  SpO2: 100% 98% 100% 96%  Weight:      Height:       General exam: Awake, laying in bed, in nad Respiratory system: Normal respiratory effort, no wheezing Cardiovascular system: regular rate, s1, s2 Gastrointestinal system: Soft, nondistended, positive BS Central nervous system: CN2-12 grossly intact, strength intact Extremities: Perfused, no clubbing Skin: Normal skin turgor, no notable skin lesions seen Psychiatry: Mood normal // no visual hallucinations   Data Reviewed:  Labs reviewed: Na 139, K 4.1, Cr 0.74, WBC 4.4, Hgb 12.4, Plts 146  Family Communication: Pt in room, family at bedside  Disposition: Status is: Inpatient Remains inpatient appropriate because: severity of illness  Planned Discharge Destination: Home    Author: Garnette Pelt, MD 08/27/2024 5:06 PM  For on call review [email or web address].  "

## 2024-08-27 NOTE — Transfer of Care (Signed)
 Immediate Anesthesia Transfer of Care Note  Patient: Danny [name]  Procedure(s) Performed: AMPUTATION, TOE (Right: Toe)  Patient Location: PACU  Anesthesia Type:MAC  Level of Consciousness: awake, alert , and oriented  Airway & Oxygen  Therapy: Patient Spontanous Breathing and Patient connected to nasal cannula oxygen   Post-op Assessment: Report given to RN and Post -op Vital signs reviewed and stable  Post vital signs: Reviewed and stable  Last Vitals:  Vitals Value Taken Time  BP 94/52 08/27/24 08:06  Temp 36 0810  Pulse 67 08/27/24 08:10  Resp 11 08/27/24 08:08  SpO2 100 % 08/27/24 08:10  Vitals shown include unfiled device data.  Last Pain:  Vitals:   08/27/24 0707  TempSrc: Oral  PainSc:       Patients Stated Pain Goal: 1 (08/26/24 2146)  Complications: No notable events documented.

## 2024-08-28 ENCOUNTER — Other Ambulatory Visit (HOSPITAL_COMMUNITY): Payer: Self-pay

## 2024-08-28 DIAGNOSIS — T148XXA Other injury of unspecified body region, initial encounter: Secondary | ICD-10-CM | POA: Diagnosis not present

## 2024-08-28 DIAGNOSIS — L089 Local infection of the skin and subcutaneous tissue, unspecified: Secondary | ICD-10-CM | POA: Diagnosis not present

## 2024-08-28 LAB — CBC
HCT: 35.3 % — ABNORMAL LOW (ref 39.0–52.0)
Hemoglobin: 12.4 g/dL — ABNORMAL LOW (ref 13.0–17.0)
MCH: 31.7 pg (ref 26.0–34.0)
MCHC: 35.1 g/dL (ref 30.0–36.0)
MCV: 90.3 fL (ref 80.0–100.0)
Platelets: 129 K/uL — ABNORMAL LOW (ref 150–400)
RBC: 3.91 MIL/uL — ABNORMAL LOW (ref 4.22–5.81)
RDW: 13 % (ref 11.5–15.5)
WBC: 4.6 K/uL (ref 4.0–10.5)
nRBC: 0 % (ref 0.0–0.2)

## 2024-08-28 LAB — COMPREHENSIVE METABOLIC PANEL WITH GFR
ALT: 20 U/L (ref 0–44)
AST: 32 U/L (ref 15–41)
Albumin: 3.4 g/dL — ABNORMAL LOW (ref 3.5–5.0)
Alkaline Phosphatase: 146 U/L — ABNORMAL HIGH (ref 38–126)
Anion gap: 9 (ref 5–15)
BUN: 13 mg/dL (ref 6–20)
CO2: 26 mmol/L (ref 22–32)
Calcium: 9.4 mg/dL (ref 8.9–10.3)
Chloride: 103 mmol/L (ref 98–111)
Creatinine, Ser: 0.77 mg/dL (ref 0.61–1.24)
GFR, Estimated: >60 mL/min
Glucose, Bld: 102 mg/dL — ABNORMAL HIGH (ref 70–99)
Potassium: 4 mmol/L (ref 3.5–5.1)
Sodium: 137 mmol/L (ref 135–145)
Total Bilirubin: 0.5 mg/dL (ref 0.0–1.2)
Total Protein: 6.6 g/dL (ref 6.5–8.1)

## 2024-08-28 MED ORDER — CEPHALEXIN 500 MG PO CAPS
500.0000 mg | ORAL_CAPSULE | Freq: Three times a day (TID) | ORAL | 0 refills | Status: AC
  Filled 2024-08-28: qty 15, 5d supply, fill #0

## 2024-08-28 NOTE — Plan of Care (Signed)
" °  Problem: Education: Goal: Knowledge of General Education information will improve Description: Including pain rating scale, medication(s)/side effects and non-pharmacologic comfort measures 08/28/2024 0736 by Arnell Wyline RAMAN, RN Outcome: Progressing 08/28/2024 0736 by Arnell Wyline RAMAN, RN Outcome: Progressing   Problem: Health Behavior/Discharge Planning: Goal: Ability to manage health-related needs will improve 08/28/2024 0736 by Arnell Wyline RAMAN, RN Outcome: Progressing 08/28/2024 0736 by Arnell Wyline RAMAN, RN Outcome: Progressing   Problem: Clinical Measurements: Goal: Ability to maintain clinical measurements within normal limits will improve 08/28/2024 0736 by Arnell Wyline RAMAN, RN Outcome: Progressing 08/28/2024 0736 by Arnell Wyline RAMAN, RN Outcome: Progressing Goal: Will remain free from infection 08/28/2024 0736 by Arnell Wyline RAMAN, RN Outcome: Progressing 08/28/2024 0736 by Arnell Wyline RAMAN, RN Outcome: Progressing Goal: Diagnostic test results will improve 08/28/2024 0736 by Arnell Wyline RAMAN, RN Outcome: Progressing 08/28/2024 0736 by Arnell Wyline RAMAN, RN Outcome: Progressing Goal: Respiratory complications will improve 08/28/2024 0736 by Arnell Wyline RAMAN, RN Outcome: Progressing 08/28/2024 0736 by Arnell Wyline RAMAN, RN Outcome: Progressing Goal: Cardiovascular complication will be avoided 08/28/2024 0736 by Arnell Wyline RAMAN, RN Outcome: Progressing 08/28/2024 0736 by Arnell Wyline RAMAN, RN Outcome: Progressing   Problem: Activity: Goal: Risk for activity intolerance will decrease 08/28/2024 0736 by Arnell Wyline RAMAN, RN Outcome: Progressing 08/28/2024 0736 by Arnell Wyline RAMAN, RN Outcome: Progressing   Problem: Nutrition: Goal: Adequate nutrition will be maintained 08/28/2024 0736 by Arnell Wyline RAMAN, RN Outcome: Progressing 08/28/2024 0736 by Arnell Wyline RAMAN, RN Outcome: Progressing   Problem: Coping: Goal: Level  of anxiety will decrease 08/28/2024 0736 by Arnell Wyline RAMAN, RN Outcome: Progressing 08/28/2024 0736 by Arnell Wyline RAMAN, RN Outcome: Progressing   Problem: Elimination: Goal: Will not experience complications related to bowel motility 08/28/2024 0736 by Arnell Wyline RAMAN, RN Outcome: Progressing 08/28/2024 0736 by Arnell Wyline RAMAN, RN Outcome: Progressing Goal: Will not experience complications related to urinary retention 08/28/2024 0736 by Arnell Wyline RAMAN, RN Outcome: Progressing 08/28/2024 0736 by Arnell Wyline RAMAN, RN Outcome: Progressing   Problem: Pain Managment: Goal: General experience of comfort will improve and/or be controlled 08/28/2024 0736 by Arnell Wyline RAMAN, RN Outcome: Progressing 08/28/2024 0736 by Arnell Wyline RAMAN, RN Outcome: Progressing   Problem: Safety: Goal: Ability to remain free from injury will improve 08/28/2024 0736 by Arnell Wyline RAMAN, RN Outcome: Progressing 08/28/2024 0736 by Arnell Wyline RAMAN, RN Outcome: Progressing   Problem: Skin Integrity: Goal: Risk for impaired skin integrity will decrease 08/28/2024 0736 by Arnell Wyline RAMAN, RN Outcome: Progressing 08/28/2024 0736 by Arnell Wyline RAMAN, RN Outcome: Progressing   "

## 2024-08-28 NOTE — Plan of Care (Signed)

## 2024-08-28 NOTE — Plan of Care (Signed)
" °  Problem: Education: Goal: Knowledge of General Education information will improve Description: Including pain rating scale, medication(s)/side effects and non-pharmacologic comfort measures 08/28/2024 0736 by Arnell Wyline RAMAN, RN Outcome: Progressing 08/28/2024 0736 by Arnell Wyline RAMAN, RN Outcome: Progressing 08/28/2024 0736 by Arnell Wyline RAMAN, RN Outcome: Progressing   Problem: Health Behavior/Discharge Planning: Goal: Ability to manage health-related needs will improve 08/28/2024 0736 by Arnell Wyline RAMAN, RN Outcome: Progressing 08/28/2024 0736 by Arnell Wyline RAMAN, RN Outcome: Progressing 08/28/2024 0736 by Arnell Wyline RAMAN, RN Outcome: Progressing   Problem: Clinical Measurements: Goal: Ability to maintain clinical measurements within normal limits will improve 08/28/2024 0736 by Arnell Wyline RAMAN, RN Outcome: Progressing 08/28/2024 0736 by Arnell Wyline RAMAN, RN Outcome: Progressing 08/28/2024 0736 by Arnell Wyline RAMAN, RN Outcome: Progressing Goal: Will remain free from infection 08/28/2024 0736 by Arnell Wyline RAMAN, RN Outcome: Progressing 08/28/2024 0736 by Arnell Wyline RAMAN, RN Outcome: Progressing 08/28/2024 0736 by Arnell Wyline RAMAN, RN Outcome: Progressing Goal: Diagnostic test results will improve 08/28/2024 0736 by Arnell Wyline RAMAN, RN Outcome: Progressing 08/28/2024 0736 by Arnell Wyline RAMAN, RN Outcome: Progressing 08/28/2024 0736 by Arnell Wyline RAMAN, RN Outcome: Progressing Goal: Respiratory complications will improve 08/28/2024 0736 by Arnell Wyline RAMAN, RN Outcome: Progressing 08/28/2024 0736 by Arnell Wyline RAMAN, RN Outcome: Progressing 08/28/2024 0736 by Arnell Wyline RAMAN, RN Outcome: Progressing Goal: Cardiovascular complication will be avoided 08/28/2024 0736 by Arnell Wyline RAMAN, RN Outcome: Progressing 08/28/2024 0736 by Arnell Wyline RAMAN, RN Outcome: Progressing 08/28/2024 0736 by Arnell Wyline RAMAN,  RN Outcome: Progressing   Problem: Activity: Goal: Risk for activity intolerance will decrease 08/28/2024 0736 by Arnell Wyline RAMAN, RN Outcome: Progressing 08/28/2024 0736 by Arnell Wyline RAMAN, RN Outcome: Progressing 08/28/2024 0736 by Arnell Wyline RAMAN, RN Outcome: Progressing   Problem: Nutrition: Goal: Adequate nutrition will be maintained 08/28/2024 0736 by Arnell Wyline RAMAN, RN Outcome: Progressing 08/28/2024 0736 by Arnell Wyline RAMAN, RN Outcome: Progressing 08/28/2024 0736 by Arnell Wyline RAMAN, RN Outcome: Progressing   Problem: Coping: Goal: Level of anxiety will decrease 08/28/2024 0736 by Arnell Wyline RAMAN, RN Outcome: Progressing 08/28/2024 0736 by Arnell Wyline RAMAN, RN Outcome: Progressing 08/28/2024 0736 by Arnell Wyline RAMAN, RN Outcome: Progressing   Problem: Elimination: Goal: Will not experience complications related to bowel motility 08/28/2024 0736 by Arnell Wyline RAMAN, RN Outcome: Progressing 08/28/2024 0736 by Arnell Wyline RAMAN, RN Outcome: Progressing 08/28/2024 0736 by Arnell Wyline RAMAN, RN Outcome: Progressing Goal: Will not experience complications related to urinary retention 08/28/2024 0736 by Arnell Wyline RAMAN, RN Outcome: Progressing 08/28/2024 0736 by Arnell Wyline RAMAN, RN Outcome: Progressing 08/28/2024 0736 by Arnell Wyline RAMAN, RN Outcome: Progressing   Problem: Pain Managment: Goal: General experience of comfort will improve and/or be controlled 08/28/2024 0736 by Arnell Wyline RAMAN, RN Outcome: Progressing 08/28/2024 0736 by Arnell Wyline RAMAN, RN Outcome: Progressing 08/28/2024 0736 by Arnell Wyline RAMAN, RN Outcome: Progressing   Problem: Safety: Goal: Ability to remain free from injury will improve 08/28/2024 0736 by Arnell Wyline RAMAN, RN Outcome: Progressing 08/28/2024 0736 by Arnell Wyline RAMAN, RN Outcome: Progressing 08/28/2024 0736 by Arnell Wyline RAMAN, RN Outcome: Progressing   Problem: Skin  Integrity: Goal: Risk for impaired skin integrity will decrease 08/28/2024 0736 by Arnell Wyline RAMAN, RN Outcome: Progressing 08/28/2024 0736 by Arnell Wyline RAMAN, RN Outcome: Progressing 08/28/2024 0736 by Arnell Wyline RAMAN, RN Outcome: Progressing   "

## 2024-08-28 NOTE — Progress Notes (Signed)
 OT Cancellation Note  Patient Details Name: Danny [name] MRN: [id number] DOB: [birth year]   Cancelled Treatment:    Reason Eval/Treat Not Completed: OT screened, no needs identified, will sign off. No OT needs identified, OT to sign off.   Danny [name], OTR/LSABRA  [facility]  Office: [phone]   Danny [name] 08/28/2024, 10:04 AM

## 2024-08-28 NOTE — Discharge Summary (Signed)
 " Physician Discharge Summary   Patient: Danny [name] MRN: [id number] DOB: 1966-08-14  Admit date:     08/26/2024  Discharge date: 08/28/2024  Discharge Physician: Garnette Pelt   PCP: Vick Lurie, FNP (Inactive)   Recommendations at discharge:    Follow up with PCP in 1-2 weeks Follow up with Podiatry as scheduled  Discharge Diagnoses: Principal Problem:   Wound infection Active Problems:   HLD (hyperlipidemia)   Hepatitis C   Hepatocellular carcinoma (HCC)   COPD (chronic obstructive pulmonary disease) (HCC)   Other cirrhosis of liver (HCC)   Type 2 diabetes mellitus (HCC)   PAD (peripheral artery disease)   Generalized anxiety disorder   Neuropathy due to type 2 diabetes mellitus (HCC)   Stage 3a chronic kidney disease (HCC)   Osteomyelitis of great toe of right foot (HCC)  Resolved Problems:   * No resolved hospital problems. *  Hospital Course: 58 y.o. male with medical history significant of hypertension, GERD, diabetes, CVA, CKD 3A, PAD, hepatitis C, cirrhosis, HCC, neuropathy, anxiety, COPD presenting with foot wound infection.   Patient injured his right foot several months ago and has been following with podiatry for this.  About 3 weeks ago he noticed increased swelling and redness at the site concerning for infection.  He followed up with podiatry today and they are concerned for deeper infection as the nail came off on his bandage was removed exposing purulent drainage.  Patient was sent to the ED for IV antibiotics, imaging, likely amputation.   Patient denies fevers, chills, chest pain, shortness breath, abdominal pain, constipation, diarrhea, nausea, vomiting.  Podiatry consulted  Assessment and Plan: Wound infection > Sent from podiatrist office with concern for deeper wound of right foot.  Nail came off exposing purulent drainage. > Initially injured foot 3 months ago-follow podiatry noticed worsening infection changes about 3 weeks ago and  followed up with podiatry today. > Started on ceftriaxone  and vancomycin  in the ED -Podiatry consulted and pt underwent R great toe amputation 12/19 -OK to d/c today per Podiatry. Discussed with podiatrist. -prescribed 5 days po keflex  on d/c    Hypertension - Not currently on medication for this   Diabetes - Continue metformin    History of hepatitis C Cirrhosis HCC > History of hepatitis C status post treatment/cure with Epclusa > Known history of cirrhosis and hepatocellular carcinoma.  HCC initially diagnosed in 2022 status post chemo, immunotherapy.  Continues to follow with oncology - LFT's stable   CKD 3a > Creatinine stable - Trend renal function and electrolytes   PAD History of CVA - No longer taking statin   Neuropathy - Continue gabapentin    COPD - Continue as needed albuterol    Anxiety - Continue home Xanax , Seroquel    Chronic opioid use -[facility] reviewed. Pt was last dispensed 120 tabs (30 day supply) of percocet 10mg  on 08/12/24 -Pt did ask RN regarding pain med prescriptions prior to d/c  -Given recent refill of percocet, will NOT prescribe narcotics on d/c      Consultants: Podiatry Procedures performed:  R great toe amputation 12/19 Disposition: Home Diet recommendation:  Regular diet DISCHARGE MEDICATION: Allergies as of 08/28/2024       Reactions   Trazodone Other (See Comments)   Caused him to sleep walk   Benadryl  [diphenhydramine ] Itching, Rash   Ultram  [tramadol ] Hives        Medication List     STOP taking these medications    amoxicillin -clavulanate 875-125 MG tablet Commonly known  as: AUGMENTIN    lidocaine -prilocaine  cream Commonly known as: EMLA    pantoprazole  40 MG tablet Commonly known as: Protonix    rosuvastatin  10 MG tablet Commonly known as: Crestor        TAKE these medications    albuterol  108 (90 Base) MCG/ACT inhaler Commonly known as: VENTOLIN  HFA Inhale 2 puffs into the lungs every 6 (six) hours as  needed for wheezing or shortness of breath.   ALPRAZolam  0.25 MG tablet Commonly known as: XANAX  Take 0.25 mg by mouth at bedtime.   cephALEXin  500 MG capsule Commonly known as: KEFLEX  Take 1 capsule (500 mg total) by mouth every 8 (eight) hours for 5 days.   citalopram  20 MG tablet Commonly known as: CELEXA  Take 20 mg by mouth daily.   cyclobenzaprine  5 MG tablet Commonly known as: FLEXERIL  Take 5 mg by mouth daily as needed for muscle spasms.   gabapentin  400 MG capsule Commonly known as: NEURONTIN  Take 400 mg by mouth 2 (two) times daily as needed. What changed: Another medication with the same name was removed. Continue taking this medication, and follow the directions you see here.   loperamide  2 MG capsule Commonly known as: IMODIUM  Take 2 tablets by mouth after the first loose stool, then 1 tablet after each loose stool. Do not exceed 5 doses per day   Melatonin 10 MG Caps Take 10 mg by mouth at bedtime.   metFORMIN  1000 MG tablet Commonly known as: GLUCOPHAGE  Take 1,000 mg by mouth 2 (two) times daily.   mirtazapine 7.5 MG tablet Commonly known as: REMERON Take 1 tablet every day by oral route for 90 days.   mupirocin  ointment 2 % Commonly known as: BACTROBAN  Apply 1 Application topically 2 (two) times daily.   naloxone 4 MG/0.1ML Liqd nasal spray kit Commonly known as: NARCAN Place 1 spray into the nose once.   ondansetron  8 MG tablet Commonly known as: Zofran  Take 1 tablet (8 mg total) by mouth every 8 (eight) hours as needed for nausea or vomiting.   oxyCODONE -acetaminophen  10-325 MG tablet Commonly known as: PERCOCET Take 1 tablet by mouth every 4 (four) hours as needed for pain. What changed:  when to take this Another medication with the same name was removed. Continue taking this medication, and follow the directions you see here.   prochlorperazine  10 MG tablet Commonly known as: COMPAZINE  Take 1 tablet (10 mg total) by mouth every 6 (six)  hours as needed for nausea or vomiting.   QUEtiapine  100 MG tablet Commonly known as: SEROQUEL  Take 100 mg by mouth at bedtime.   Sofosbuvir-Velpatasvir 400-100 MG Tabs Take 1 tablet by mouth daily.   Vitamin D -3 125 MCG (5000 UT) Tabs Take 5,000 Units by mouth daily.        Follow-up Information     Vick Lurie, FNP Follow up in 2 week(s).   Specialty: Family Medicine Why: Hospital follow up Contact information: [address] [place] [address] [phone]         Malvin Marsa FALCON, DPM Follow up.   Specialty: Podiatry Why: Hospital follow up, as scheduled Contact information: [address] [place] [address] [phone]                Discharge Exam: Fredricka Weights   08/26/24 1208 08/27/24 0703  Weight: 74.8 kg 74.8 kg   General exam: Awake, laying in bed, in nad Respiratory system: Normal respiratory effort, no wheezing Cardiovascular system: regular rate, s1, s2  Gastrointestinal system: Soft, nondistended, positive BS Central nervous system: CN2-12 grossly intact, strength intact Extremities: R foot with pos-op dressings in place Skin: Normal skin turgor, no notable skin lesions seen Psychiatry: Mood normal // no visual hallucinations    Condition at discharge: fair  The results of significant diagnostics from this hospitalization (including imaging, microbiology, ancillary and laboratory) are listed below for reference.   Imaging Studies: DG Foot 2 Views Right Result Date: 08/27/2024 CLINICAL DATA:  Postop right foot. EXAM: RIGHT FOOT - 2 VIEW COMPARISON:  Radiograph yesterday FINDINGS: Interval resection of the great toe. Expected postoperative changes in the soft tissues. No acute fracture or bony destructive change. Anchor in the calcaneus. IMPRESSION: Interval resection of the great toe. Electronically Signed   By: Andrea Gasman M.D.   On: 08/27/2024 11:26   DG Foot Complete  Right Result Date: 08/27/2024 Please see detailed radiograph report in office note.  CT Foot Right Wo Contrast Result Date: 08/26/2024 EXAM: CT OF THE RIGHT FOOT, WITHOUT IV CONTRAST 08/26/2024 12:57:00 PM TECHNIQUE: Axial images were acquired through the right foot without IV contrast. Reformatted images were reviewed. Automated exposure control, iterative reconstruction, and/or weight based adjustment of the mA/kV was utilized to reduce the radiation dose to as low as reasonably achievable. COMPARISON: Foot x-ray dated 08/26/2024. CLINICAL HISTORY: Osteomyelitis suspected, foot, xray done. FINDINGS: BONES: Cortical irregularity, fragmentation, and a small amount of air are present within the distal portion of the first phalanx. An orthopedic screw is seen within the posterior calcaneus. JOINTS: Moderate degenerative changes are present at the first metatarsophalangeal joint, talonavicular joint, and first interphalangeal joint. No dislocation. SOFT TISSUES: Soft tissue swelling and air surround the distal first phalanx. There is no foreign body in this region. Peripheral vascular calcifications are present. Soft tissue swelling of the dorsal foot is noted. IMPRESSION: 1. Osteomyelitis of the distal first phalanx with associated surrounding soft tissue swelling and gas; no radiopaque foreign body. 2. Moderate degenerative changes at the first metatarsophalangeal joint, talonavicular joint, and first interphalangeal joint. 3. Orthopedic screw within the posterior calcaneus. 4. Peripheral vascular calcifications. Electronically signed by: Greig Pique MD 08/26/2024 05:20 PM EST RP Workstation: [id number]   VAS US  ABI WITH/WO TBI Result Date: 08/26/2024  LOWER EXTREMITY DOPPLER STUDY Patient Name:  WEI POPLASKI  Date of Exam:   08/26/2024 Medical Rec #: [id number]         Accession #:    [id number] Date of Birth: 1966/01/28         Patient Gender: M Patient Age:   89 years Exam Location:  [facility] Procedure:      VAS US  ABI WITH/WO TBI Referring Phys: MARSA HONOUR --------------------------------------------------------------------------------  Indications: Ulceration. High Risk Factors: Hypertension, hyperlipidemia, Diabetes, current smoker, prior                    CVA. Other Factors: CKD, COPD.  Vascular Interventions: Right SFA stenting (6 x 100 mm Elluvia) 05/06/2022. Comparison Study: previous exam was on 09/19/2022 Performing Technologist: Leigh Rom RVT/RDMS  Examination Guidelines: A complete evaluation includes at minimum, Doppler waveform signals and systolic blood pressure reading at the level of bilateral brachial, anterior tibial, and posterior tibial arteries, when vessel segments are accessible. Bilateral testing is considered an integral part of a complete examination. Photoelectric Plethysmograph (PPG) waveforms and toe systolic pressure readings are included as required and additional duplex testing as needed. Limited examinations for reoccurring indications may be performed as noted.  ABI Findings: +---------+------------------+-----+--------------+-------------------+  Right    Rt Pressure (mmHg)IndexWaveform      Comment             +---------+------------------+-----+--------------+-------------------+ Brachial 147                    triphasic                         +---------+------------------+-----+--------------+-------------------+ PTA      145               0.93 triphasic                         +---------+------------------+-----+--------------+-------------------+ DP       148               0.95 triphasic                         +---------+------------------+-----+--------------+-------------------+ Great Toe                       not visualizedopen wound, bandage +---------+------------------+-----+--------------+-------------------+ +---------+------------------+-----+---------+-------+ Left     Lt Pressure (mmHg)IndexWaveform  Comment +---------+------------------+-----+---------+-------+ Brachial 156                    triphasic        +---------+------------------+-----+---------+-------+ PTA      160               1.03 triphasic        +---------+------------------+-----+---------+-------+ DP       164               1.05 triphasic        +---------+------------------+-----+---------+-------+ Great Toe141               0.90 Normal           +---------+------------------+-----+---------+-------+   Summary: Right: Resting right ankle-brachial index is within normal range.  Left: Resting left ankle-brachial index is within normal range. The left toe-brachial index is normal.  *See table(s) above for measurements and observations.  Electronically signed by Fonda Rim on 08/26/2024 at 4:40:58 PM.    Final     Microbiology: Results for orders placed or performed during the hospital encounter of 08/26/24  Surgical PCR screen     Status: None   Collection Time: 08/27/24  4:19 AM   Specimen: Nasal Mucosa; Nasal Swab  Result Value Ref Range Status   MRSA, PCR NEGATIVE NEGATIVE Final   Staphylococcus aureus NEGATIVE NEGATIVE Final    Comment: (NOTE) The Xpert SA Assay (FDA approved for NASAL specimens in patients 47 years of age and older), is one component of a comprehensive surveillance program. It is not intended to diagnose infection nor to guide or monitor treatment. Performed at [facility], [address] [place], [place] [address]   Aerobic/Anaerobic Culture w Gram Stain (surgical/deep wound)     Status: None (Preliminary result)   Collection Time: 08/27/24  7:36 AM   Specimen: PATH Soft tissue  Result Value Ref Range Status   Specimen Description TISSUE  Final   Special Requests RIGHT GREAT TOE  Final   Gram Stain   Final    NO WBC SEEN ABUNDANT GRAM POSITIVE COCCI IN PAIRS RARE GRAM NEGATIVE RODS Performed at [facility], [address], [place], [place] [address]     Culture PENDING  Incomplete  Report Status PENDING  Incomplete    Labs: CBC: Recent Labs  Lab 08/26/24 1218 08/27/24 0419 08/28/24 0648  WBC 6.4 4.4 4.6  NEUTROABS 4.6  --   --   HGB 13.9 12.4* 12.4*  HCT 41.8 36.9* 35.3*  MCV 93.1 91.6 90.3  PLT 149* 145* 129*   Basic Metabolic Panel: Recent Labs  Lab 08/26/24 1218 08/27/24 0419 08/28/24 0648  NA 137 139 137  K 4.5 4.1 4.0  CL 102 104 103  CO2 24 24 26   GLUCOSE 128* 96 102*  BUN 9 10 13   CREATININE 0.79 0.74 0.77  CALCIUM  9.7 9.6 9.4   Liver Function Tests: Recent Labs  Lab 08/26/24 1909 08/27/24 0419 08/28/24 0648  AST 35 35 32  ALT 21 20 20   ALKPHOS 128* 122 146*  BILITOT 0.8 0.7 0.5  PROT 6.8 6.5 6.6  ALBUMIN  3.5 3.3* 3.4*   CBG: Recent Labs  Lab 08/26/24 2114 08/27/24 0621 08/27/24 0816  GLUCAP 140* 125* 108*    Discharge time spent: less than 30 minutes.  Signed: Garnette Pelt, MD [facility] 08/28/2024 "

## 2024-08-28 NOTE — Evaluation (Signed)
 Physical Therapy Evaluation and Discharge Patient Details Name: Danny [name] MRN: [id number] DOB: 05-08-1966 Today's Date: 08/28/2024  History of Present Illness  58 y.o. male presents 08/26/24 with foot wound infection. S/p amputation R great toe 12/19. PMH: HTN, GERD, diabetes, CVA, CKD 3A, PAD, hepatitis C, cirrhosis, HCC, neuropathy, anxiety, and COPD.  Clinical Impression  Patient evaluated by Physical Therapy with no further acute PT needs identified. All education has been completed and the patient has no further questions. Patient able to demonstrate don/doff post-op shoe and safe use of RW (which pt already owns). Patient ambulated 120 ft with RW modified independent and up/down 3 steps with rail with CGA. PT is signing off. Thank you for this referral.         If plan is discharge home, recommend the following: Help with stairs or ramp for entrance   Can travel by private vehicle        Equipment Recommendations None recommended by PT  Recommendations for Other Services       Functional Status Assessment Patient has had a recent decline in their functional status and/or demonstrates limited ability to make significant improvements in function in a reasonable and predictable amount of time     Precautions / Restrictions Precautions Precautions: Fall Recall of Precautions/Restrictions: Intact Required Braces or Orthoses: Other Brace Other Brace: post-op shoe Restrictions Weight Bearing Restrictions Per Provider Order: No      Mobility  Bed Mobility Overal bed mobility: Modified Independent             General bed mobility comments: HOB elevated    Transfers Overall transfer level: Needs assistance Equipment used: Rolling walker (2 wheels) Transfers: Sit to/from Stand Sit to Stand: Contact guard assist           General transfer comment: initially denied need for RW, however as coming up to stand he began reaching for the RW and PT brought it  closer to him    Ambulation/Gait Ambulation/Gait assistance: Contact guard assist, Modified independent (Device/Increase time) Gait Distance (Feet): 120 Feet Assistive device: Rolling walker (2 wheels) Gait Pattern/deviations: Step-to pattern, Decreased dorsiflexion - right   Gait velocity interpretation: <1.8 ft/sec, indicate of risk for recurrent falls   General Gait Details: slight drag RLE despite wearing Lt shoe to try to balance leg length; with cues for heelstrike he was able to clear post-op shoe without dragging it  Stairs Stairs: Yes Stairs assistance: Supervision Stair Management: One rail Right, Step to pattern, Forwards Number of Stairs: 3 General stair comments: no cues needed for sequencing  Wheelchair Mobility     Tilt Bed    Modified Rankin (Stroke Patients Only)       Balance Overall balance assessment: Modified Independent                                           Pertinent Vitals/Pain Pain Assessment Pain Assessment: 0-10 Pain Score: 8  Pain Location: foot Pain Descriptors / Indicators: Aching Pain Intervention(s): Limited activity within patient's tolerance, Monitored during session, Premedicated before session    Home Living Family/patient expects to be discharged to:: Private residence Living Arrangements: Other relatives (niece) Available Help at Discharge: Family;Available 24 hours/day Type of Home: House Home Access: Stairs to enter Entrance Stairs-Rails: Right;Left;Can reach both Entrance Stairs-Number of Steps: 3   Home Layout: One level Home Equipment: Agricultural Consultant (2 wheels);Cane -  single point;Grab bars - tub/shower;Shower seat;Hand held shower head      Prior Function Prior Level of Function : Needs assist;History of Falls (last six months)             Mobility Comments: uses RW when in community; uses cane at home because he was off balance ADLs Comments: niece cooks, cleans; pt showers, dresses  himself     Extremity/Trunk Assessment   Upper Extremity Assessment Upper Extremity Assessment: Overall WFL for tasks assessed    Lower Extremity Assessment Lower Extremity Assessment: Overall WFL for tasks assessed    Cervical / Trunk Assessment Cervical / Trunk Assessment: Normal  Communication   Communication Communication: No apparent difficulties    Cognition Arousal: Alert Behavior During Therapy: Flat affect   PT - Cognitive impairments: No apparent impairments                         Following commands: Intact       Cueing Cueing Techniques: Verbal cues     General Comments      Exercises     Assessment/Plan    PT Assessment Patient does not need any further PT services  PT Problem List         PT Treatment Interventions      PT Goals (Current goals can be found in the Care Plan section)  Acute Rehab PT Goals Patient Stated Goal: go home today PT Goal Formulation: All assessment and education complete, DC therapy    Frequency       Co-evaluation               AM-PAC PT 6 Clicks Mobility  Outcome Measure Help needed turning from your back to your side while in a flat bed without using bedrails?: None Help needed moving from lying on your back to sitting on the side of a flat bed without using bedrails?: None Help needed moving to and from a bed to a chair (including a wheelchair)?: None Help needed standing up from a chair using your arms (e.g., wheelchair or bedside chair)?: None Help needed to walk in hospital room?: None Help needed climbing 3-5 steps with a railing? : A Little 6 Click Score: 23    End of Session Equipment Utilized During Treatment: Gait belt Activity Tolerance: Patient tolerated treatment well Patient left: in bed;with call bell/phone within reach Nurse Communication: Mobility status;Other (comment) (ok for discharge) PT Visit Diagnosis: Other abnormalities of gait and mobility (R26.89)    Time:  9095-9068 PT Time Calculation (min) (ACUTE ONLY): 27 min   Charges:   PT Evaluation $PT Eval Low Complexity: 1 Low PT Treatments $Gait Training: 8-22 mins PT General Charges $$ ACUTE PT VISIT: 1 Visit          Macario RAMAN, PT Acute Rehabilitation Services  Office [phone]   Macario SHAUNNA Soja 08/28/2024, 9:39 AM

## 2024-08-30 ENCOUNTER — Encounter (HOSPITAL_COMMUNITY): Payer: Self-pay | Admitting: Podiatry

## 2024-08-30 LAB — SURGICAL PATHOLOGY

## 2024-08-30 NOTE — Anesthesia Postprocedure Evaluation (Signed)
"   Anesthesia Post Note  Patient: KYEL PURK  Procedure(s) Performed: AMPUTATION, TOE (Right: Toe)     Patient location during evaluation: PACU Anesthesia Type: MAC Level of consciousness: awake Pain management: pain level controlled Vital Signs Assessment: post-procedure vital signs reviewed and stable Respiratory status: spontaneous breathing Cardiovascular status: blood pressure returned to baseline Postop Assessment: no apparent nausea or vomiting Anesthetic complications: no   There were no known notable events for this encounter.              Lauraine DASEN Colhoun      "

## 2024-09-01 ENCOUNTER — Ambulatory Visit (INDEPENDENT_AMBULATORY_CARE_PROVIDER_SITE_OTHER): Admitting: Podiatry

## 2024-09-01 ENCOUNTER — Ambulatory Visit

## 2024-09-01 DIAGNOSIS — S98111A Complete traumatic amputation of right great toe, initial encounter: Secondary | ICD-10-CM

## 2024-09-01 LAB — AEROBIC/ANAEROBIC CULTURE W GRAM STAIN (SURGICAL/DEEP WOUND): Gram Stain: NONE SEEN

## 2024-09-01 MED ORDER — OXYCODONE-ACETAMINOPHEN 10-325 MG PO TABS: 1.0000 | ORAL_TABLET | Freq: Four times a day (QID) | ORAL | 0 refills | Status: AC | PRN

## 2024-09-01 NOTE — Progress Notes (Signed)
 "  Subjective:  Patient ID: Danny [name], male    DOB: [birth year],  MRN: [id number]  Chief Complaint  Patient presents with   Foot Amputation    amputation site check first POV    DOS:08/27/2024 Procedure: Right first metatarsophalangeal joint toe amputation  58 y.o. male returns for post-op check.  He states he is doing okay.  Pain is controlled.  Bandages clean dry and intact.  Weightbearing as tolerated in surgical shoe  Review of Systems: Negative except as noted in the HPI. Denies N/V/F/Ch.  Past Medical History:  Diagnosis Date   Alcohol abuse    Anxiety    Back pain    Cancer (HCC)    Cirrhosis (HCC)    CVA (cerebral infarction)    Depression    Diabetes mellitus    Hepatitis C    Hypertension    Migraines    Stroke (HCC)    Current Medications[1]  Tobacco Use History[2]  Allergies[3] Objective:  There were no vitals filed for this visit. There is no height or weight on file to calculate BMI. Constitutional Well developed. Well nourished.  Vascular Foot warm and well perfused. Capillary refill normal to all digits.   Neurologic Normal speech. Oriented to person, place, and time. Epicritic sensation to light touch grossly present bilaterally.  Dermatologic Skin healing well without signs of infection. Skin edges well coapted without signs of infection.  Orthopedic: Tenderness to palpation noted about the surgical site.   Radiographs: 3 views of trauma showed a right foot: Amputation noted at the first metatarsophalangeal joint.  No other signs of osteomyelitis noted no soft tissue emphysema noted.  No foreign body noted Assessment:   1. Amputation of right great toe    Plan:  Patient was evaluated and treated and all questions answered.  S/p foot surgery right -Progressing as expected post-operatively. -XR: See above -WB Status: Weightbearing as tolerated in surgical shoe -Sutures: Intact.  No clinical signs of dehiscence noted no complication  noted. -Medications: None -Foot redressed.  No follow-ups on file.     [1]  Current Outpatient Medications:    oxyCODONE -acetaminophen  (PERCOCET) 10-325 MG tablet, Take 1 tablet by mouth every 6 (six) hours as needed for up to 8 days for pain., Disp: 30 tablet, Rfl: 0   albuterol  (VENTOLIN  HFA) 108 (90 Base) MCG/ACT inhaler, Inhale 2 puffs into the lungs every 6 (six) hours as needed for wheezing or shortness of breath., Disp: , Rfl:    ALPRAZolam  (XANAX ) 0.25 MG tablet, Take 0.25 mg by mouth at bedtime., Disp: , Rfl:    Cholecalciferol  (VITAMIN D -3) 125 MCG (5000 UT) TABS, Take 5,000 Units by mouth daily., Disp: , Rfl:    citalopram  (CELEXA ) 20 MG tablet, Take 20 mg by mouth daily., Disp: , Rfl:    cyclobenzaprine  (FLEXERIL ) 5 MG tablet, Take 5 mg by mouth daily as needed for muscle spasms., Disp: , Rfl:    gabapentin  (NEURONTIN ) 400 MG capsule, Take 400 mg by mouth 2 (two) times daily as needed., Disp: , Rfl:    Melatonin 10 MG CAPS, Take 10 mg by mouth at bedtime., Disp: , Rfl:    metFORMIN  (GLUCOPHAGE ) 1000 MG tablet, Take 1,000 mg by mouth 2 (two) times daily., Disp: , Rfl:    mirtazapine (REMERON) 7.5 MG tablet, Take 1 tablet every day by oral route for 90 days., Disp: , Rfl:    naloxone (NARCAN) nasal spray 4 mg/0.1 mL, Place 1 spray into the nose once., Disp: ,  Rfl:    ondansetron  (ZOFRAN ) 8 MG tablet, Take 1 tablet (8 mg total) by mouth every 8 (eight) hours as needed for nausea or vomiting., Disp: 30 tablet, Rfl: 1   oxyCODONE -acetaminophen  (PERCOCET) 10-325 MG tablet, Take 1 tablet by mouth every 4 (four) hours as needed for pain. (Patient taking differently: Take 1 tablet by mouth in the morning, at noon, in the evening, and at bedtime.), Disp: 15 tablet, Rfl: 0   prochlorperazine  (COMPAZINE ) 10 MG tablet, Take 1 tablet (10 mg total) by mouth every 6 (six) hours as needed for nausea or vomiting., Disp: 30 tablet, Rfl: 1   QUEtiapine  (SEROQUEL ) 100 MG tablet, Take 100 mg by mouth at  bedtime., Disp: , Rfl:    Sofosbuvir-Velpatasvir 400-100 MG TABS, Take 1 tablet by mouth daily., Disp: , Rfl:  [2]  Social History Tobacco Use  Smoking Status Every Day   Current packs/day: 0.50   Average packs/day: 0.5 packs/day for 20.0 years (10.0 ttl pk-yrs)   Types: Cigarettes   Passive exposure: Current  Smokeless Tobacco Never  [3]  Allergies Allergen Reactions   Trazodone Other (See Comments)    Caused him to sleep walk   Benadryl  [Diphenhydramine ] Itching and Rash   Ultram  [Tramadol ] Hives   "

## 2024-09-06 ENCOUNTER — Encounter: Payer: Self-pay | Admitting: *Deleted

## 2024-09-15 ENCOUNTER — Ambulatory Visit (INDEPENDENT_AMBULATORY_CARE_PROVIDER_SITE_OTHER): Admitting: Podiatry

## 2024-09-15 DIAGNOSIS — Z89419 Acquired absence of unspecified great toe: Secondary | ICD-10-CM

## 2024-09-15 NOTE — Progress Notes (Signed)
 "  Subjective:  Patient ID: Danny [name], male    DOB: May 06, 1966,  MRN: [id number]  Chief Complaint  Patient presents with   Amputation of right great toe    DOS:08/27/2024 Procedure: Right first metatarsophalangeal joint toe amputation  59 y.o. male returns for post-op check.  He states he is doing okay.  Pain is controlled.  Bandages clean dry and intact.  Weightbearing as tolerated in surgical shoe  Review of Systems: Negative except as noted in the HPI. Denies N/V/F/Ch.  Past Medical History:  Diagnosis Date   Alcohol abuse    Anxiety    Back pain    Cancer (HCC)    Cirrhosis (HCC)    CVA (cerebral infarction)    Depression    Diabetes mellitus    Hepatitis C    Hypertension    Migraines    Stroke (HCC)    Current Medications[1]  Tobacco Use History[2]  Allergies[3] Objective:  There were no vitals filed for this visit. There is no height or weight on file to calculate BMI. Constitutional Well developed. Well nourished.  Vascular Foot warm and well perfused. Capillary refill normal to all digits.   Neurologic Normal speech. Oriented to person, place, and time. Epicritic sensation to light touch grossly present bilaterally.  Dermatologic  skin completely epithelialized.  No signs of dehiscence noted no complication noted.  No further signs of ulceration noted.  No recurrence noted.  Orthopedic: No further tenderness to palpation noted about the surgical site.   Radiographs: 3 views of trauma showed a right foot: Amputation noted at the first metatarsophalangeal joint.  No other signs of osteomyelitis noted no soft tissue emphysema noted.  No foreign body noted Assessment:   No diagnosis found.  Plan:  Patient was evaluated and treated and all questions answered.  S/p foot surgery right - Clinically healed patient to discharge from my care if any foot and ankle issues are future he will come back and see me.  At this time I discussed return to regular  shoes.  If there is any wound dehiscence or complication he will come back and see me or Dr. Silva.  Patient is agreed with the plan.  No follow-ups on file.      [1]  Current Outpatient Medications:    albuterol  (VENTOLIN  HFA) 108 (90 Base) MCG/ACT inhaler, Inhale 2 puffs into the lungs every 6 (six) hours as needed for wheezing or shortness of breath., Disp: , Rfl:    ALPRAZolam  (XANAX ) 0.25 MG tablet, Take 0.25 mg by mouth at bedtime., Disp: , Rfl:    Cholecalciferol  (VITAMIN D -3) 125 MCG (5000 UT) TABS, Take 5,000 Units by mouth daily., Disp: , Rfl:    citalopram  (CELEXA ) 20 MG tablet, Take 20 mg by mouth daily., Disp: , Rfl:    cyclobenzaprine  (FLEXERIL ) 5 MG tablet, Take 5 mg by mouth daily as needed for muscle spasms., Disp: , Rfl:    gabapentin  (NEURONTIN ) 400 MG capsule, Take 400 mg by mouth 2 (two) times daily as needed., Disp: , Rfl:    Melatonin 10 MG CAPS, Take 10 mg by mouth at bedtime., Disp: , Rfl:    metFORMIN  (GLUCOPHAGE ) 1000 MG tablet, Take 1,000 mg by mouth 2 (two) times daily., Disp: , Rfl:    mirtazapine (REMERON) 7.5 MG tablet, Take 1 tablet every day by oral route for 90 days., Disp: , Rfl:    naloxone (NARCAN) nasal spray 4 mg/0.1 mL, Place 1 spray into the nose once., Disp: ,  Rfl:    ondansetron  (ZOFRAN ) 8 MG tablet, Take 1 tablet (8 mg total) by mouth every 8 (eight) hours as needed for nausea or vomiting., Disp: 30 tablet, Rfl: 1   oxyCODONE -acetaminophen  (PERCOCET) 10-325 MG tablet, Take 1 tablet by mouth every 4 (four) hours as needed for pain. (Patient taking differently: Take 1 tablet by mouth in the morning, at noon, in the evening, and at bedtime.), Disp: 15 tablet, Rfl: 0   prochlorperazine  (COMPAZINE ) 10 MG tablet, Take 1 tablet (10 mg total) by mouth every 6 (six) hours as needed for nausea or vomiting., Disp: 30 tablet, Rfl: 1   QUEtiapine  (SEROQUEL ) 100 MG tablet, Take 100 mg by mouth at bedtime., Disp: , Rfl:    Sofosbuvir-Velpatasvir 400-100 MG TABS,  Take 1 tablet by mouth daily., Disp: , Rfl:  [2]  Social History Tobacco Use  Smoking Status Every Day   Current packs/day: 0.50   Average packs/day: 0.5 packs/day for 20.0 years (10.0 ttl pk-yrs)   Types: Cigarettes   Passive exposure: Current  Smokeless Tobacco Never  [3]  Allergies Allergen Reactions   Trazodone Other (See Comments)    Caused him to sleep walk   Benadryl  [Diphenhydramine ] Itching and Rash   Ultram  [Tramadol ] Hives   "

## 2024-10-02 ENCOUNTER — Inpatient Hospital Stay (HOSPITAL_COMMUNITY)
Admission: EM | Admit: 2024-10-02 | Discharge: 2024-10-07 | DRG: 617 | Disposition: A | Discharge status: Home / Self Care | Attending: Internal Medicine | Admitting: Internal Medicine

## 2024-10-02 ENCOUNTER — Emergency Department (HOSPITAL_COMMUNITY)
Admission: EM | Admit: 2024-10-02 | Discharge: 2024-10-02 | Disposition: A | Discharge status: Home / Self Care | Source: Home / Self Care | Attending: Emergency Medicine | Admitting: Emergency Medicine

## 2024-10-02 ENCOUNTER — Encounter (HOSPITAL_COMMUNITY): Payer: Self-pay

## 2024-10-02 ENCOUNTER — Emergency Department (HOSPITAL_COMMUNITY)

## 2024-10-02 ENCOUNTER — Other Ambulatory Visit: Payer: Self-pay

## 2024-10-02 DIAGNOSIS — M86171 Other acute osteomyelitis, right ankle and foot: Secondary | ICD-10-CM | POA: Diagnosis present

## 2024-10-02 DIAGNOSIS — R109 Unspecified abdominal pain: Principal | ICD-10-CM

## 2024-10-02 DIAGNOSIS — F419 Anxiety disorder, unspecified: Secondary | ICD-10-CM | POA: Diagnosis present

## 2024-10-02 DIAGNOSIS — K7460 Unspecified cirrhosis of liver: Secondary | ICD-10-CM | POA: Diagnosis present

## 2024-10-02 DIAGNOSIS — M869 Osteomyelitis, unspecified: Secondary | ICD-10-CM | POA: Diagnosis present

## 2024-10-02 DIAGNOSIS — Z66 Do not resuscitate: Secondary | ICD-10-CM | POA: Diagnosis present

## 2024-10-02 DIAGNOSIS — R1084 Generalized abdominal pain: Secondary | ICD-10-CM

## 2024-10-02 DIAGNOSIS — R1110 Vomiting, unspecified: Secondary | ICD-10-CM | POA: Diagnosis present

## 2024-10-02 DIAGNOSIS — Z888 Allergy status to other drugs, medicaments and biological substances status: Secondary | ICD-10-CM

## 2024-10-02 DIAGNOSIS — Z8505 Personal history of malignant neoplasm of liver: Secondary | ICD-10-CM | POA: Insufficient documentation

## 2024-10-02 DIAGNOSIS — K625 Hemorrhage of anus and rectum: Secondary | ICD-10-CM | POA: Insufficient documentation

## 2024-10-02 DIAGNOSIS — M86671 Other chronic osteomyelitis, right ankle and foot: Secondary | ICD-10-CM | POA: Diagnosis present

## 2024-10-02 DIAGNOSIS — Z885 Allergy status to narcotic agent status: Secondary | ICD-10-CM

## 2024-10-02 DIAGNOSIS — I129 Hypertensive chronic kidney disease with stage 1 through stage 4 chronic kidney disease, or unspecified chronic kidney disease: Secondary | ICD-10-CM | POA: Diagnosis present

## 2024-10-02 DIAGNOSIS — L03031 Cellulitis of right toe: Secondary | ICD-10-CM

## 2024-10-02 DIAGNOSIS — I10 Essential (primary) hypertension: Secondary | ICD-10-CM | POA: Diagnosis present

## 2024-10-02 DIAGNOSIS — E1169 Type 2 diabetes mellitus with other specified complication: Principal | ICD-10-CM | POA: Diagnosis present

## 2024-10-02 DIAGNOSIS — Z85118 Personal history of other malignant neoplasm of bronchus and lung: Secondary | ICD-10-CM | POA: Insufficient documentation

## 2024-10-02 DIAGNOSIS — J449 Chronic obstructive pulmonary disease, unspecified: Secondary | ICD-10-CM | POA: Diagnosis present

## 2024-10-02 DIAGNOSIS — F17210 Nicotine dependence, cigarettes, uncomplicated: Secondary | ICD-10-CM | POA: Diagnosis present

## 2024-10-02 DIAGNOSIS — Z8619 Personal history of other infectious and parasitic diseases: Secondary | ICD-10-CM

## 2024-10-02 DIAGNOSIS — E1140 Type 2 diabetes mellitus with diabetic neuropathy, unspecified: Secondary | ICD-10-CM | POA: Diagnosis present

## 2024-10-02 DIAGNOSIS — E1122 Type 2 diabetes mellitus with diabetic chronic kidney disease: Secondary | ICD-10-CM | POA: Diagnosis present

## 2024-10-02 DIAGNOSIS — F32A Depression, unspecified: Secondary | ICD-10-CM | POA: Diagnosis present

## 2024-10-02 DIAGNOSIS — C220 Liver cell carcinoma: Secondary | ICD-10-CM | POA: Diagnosis present

## 2024-10-02 DIAGNOSIS — E861 Hypovolemia: Secondary | ICD-10-CM | POA: Diagnosis present

## 2024-10-02 DIAGNOSIS — R748 Abnormal levels of other serum enzymes: Secondary | ICD-10-CM | POA: Diagnosis present

## 2024-10-02 DIAGNOSIS — E860 Dehydration: Secondary | ICD-10-CM | POA: Diagnosis present

## 2024-10-02 DIAGNOSIS — Z7984 Long term (current) use of oral hypoglycemic drugs: Secondary | ICD-10-CM

## 2024-10-02 DIAGNOSIS — Z89411 Acquired absence of right great toe: Secondary | ICD-10-CM

## 2024-10-02 DIAGNOSIS — Z823 Family history of stroke: Secondary | ICD-10-CM

## 2024-10-02 DIAGNOSIS — E872 Acidosis, unspecified: Secondary | ICD-10-CM | POA: Diagnosis present

## 2024-10-02 DIAGNOSIS — R54 Age-related physical debility: Secondary | ICD-10-CM | POA: Diagnosis present

## 2024-10-02 DIAGNOSIS — R112 Nausea with vomiting, unspecified: Secondary | ICD-10-CM

## 2024-10-02 DIAGNOSIS — Z8249 Family history of ischemic heart disease and other diseases of the circulatory system: Secondary | ICD-10-CM

## 2024-10-02 DIAGNOSIS — N1831 Chronic kidney disease, stage 3a: Secondary | ICD-10-CM | POA: Diagnosis present

## 2024-10-02 DIAGNOSIS — Z79899 Other long term (current) drug therapy: Secondary | ICD-10-CM

## 2024-10-02 DIAGNOSIS — F1123 Opioid dependence with withdrawal: Secondary | ICD-10-CM | POA: Diagnosis present

## 2024-10-02 DIAGNOSIS — G250 Essential tremor: Secondary | ICD-10-CM | POA: Diagnosis present

## 2024-10-02 DIAGNOSIS — Z716 Tobacco abuse counseling: Secondary | ICD-10-CM

## 2024-10-02 DIAGNOSIS — Z8673 Personal history of transient ischemic attack (TIA), and cerebral infarction without residual deficits: Secondary | ICD-10-CM

## 2024-10-02 DIAGNOSIS — G894 Chronic pain syndrome: Secondary | ICD-10-CM | POA: Diagnosis present

## 2024-10-02 DIAGNOSIS — K219 Gastro-esophageal reflux disease without esophagitis: Secondary | ICD-10-CM | POA: Diagnosis present

## 2024-10-02 DIAGNOSIS — E119 Type 2 diabetes mellitus without complications: Secondary | ICD-10-CM

## 2024-10-02 LAB — COMPREHENSIVE METABOLIC PANEL WITH GFR
ALT: 22 U/L (ref 0–44)
AST: 34 U/L (ref 15–41)
Albumin: 4.1 g/dL (ref 3.5–5.0)
Alkaline Phosphatase: 131 U/L — ABNORMAL HIGH (ref 38–126)
Anion gap: 16 — ABNORMAL HIGH (ref 5–15)
BUN: 6 mg/dL (ref 6–20)
CO2: 21 mmol/L — ABNORMAL LOW (ref 22–32)
Calcium: 9.9 mg/dL (ref 8.9–10.3)
Chloride: 103 mmol/L (ref 98–111)
Creatinine, Ser: 0.84 mg/dL (ref 0.61–1.24)
GFR, Estimated: >60 mL/min
Glucose, Bld: 190 mg/dL — ABNORMAL HIGH (ref 70–99)
Potassium: 4.1 mmol/L (ref 3.5–5.1)
Sodium: 140 mmol/L (ref 135–145)
Total Bilirubin: 1.1 mg/dL (ref 0.0–1.2)
Total Protein: 7.6 g/dL (ref 6.5–8.1)

## 2024-10-02 LAB — TYPE AND SCREEN
ABO/RH(D): O POS
ABO/RH(D): O POS
Antibody Screen: NEGATIVE
Antibody Screen: NEGATIVE

## 2024-10-02 LAB — CBC
HCT: 40 % (ref 39.0–52.0)
Hemoglobin: 13.6 g/dL (ref 13.0–17.0)
MCH: 31 pg (ref 26.0–34.0)
MCHC: 34 g/dL (ref 30.0–36.0)
MCV: 91.1 fL (ref 80.0–100.0)
Platelets: 109 10*3/uL — ABNORMAL LOW (ref 150–400)
RBC: 4.39 MIL/uL (ref 4.22–5.81)
RDW: 13.2 % (ref 11.5–15.5)
WBC: 4.1 10*3/uL (ref 4.0–10.5)
nRBC: 0 % (ref 0.0–0.2)

## 2024-10-02 LAB — URINALYSIS, ROUTINE W REFLEX MICROSCOPIC
Bilirubin Urine: NEGATIVE
Glucose, UA: NEGATIVE mg/dL
Hgb urine dipstick: NEGATIVE
Ketones, ur: NEGATIVE mg/dL
Leukocytes,Ua: NEGATIVE
Nitrite: NEGATIVE
Protein, ur: NEGATIVE mg/dL
Specific Gravity, Urine: 1.029 (ref 1.005–1.030)
pH: 7 (ref 5.0–8.0)

## 2024-10-02 LAB — LACTIC ACID, PLASMA
Lactic Acid, Venous: 3.5 mmol/L (ref 0.5–1.9)
Lactic Acid, Venous: 2.4 mmol/L (ref 0.5–1.9)
Lactic Acid, Venous: 3.6 mmol/L (ref 0.5–1.9)
Lactic Acid, Venous: 3.1 mmol/L (ref 0.5–1.9)

## 2024-10-02 LAB — POC OCCULT BLOOD, ED: Fecal Occult Bld: NEGATIVE

## 2024-10-02 LAB — LIPASE, BLOOD: Lipase: 59 U/L — ABNORMAL HIGH (ref 11–51)

## 2024-10-02 MED ORDER — OXYCODONE HCL 5 MG PO TABS
5.0000 mg | ORAL_TABLET | Freq: Four times a day (QID) | ORAL | Status: DC | PRN
  Administered 2024-10-02 – 2024-10-07 (×11): 5 mg via ORAL
  Filled 2024-10-02 (×12): qty 1

## 2024-10-02 MED ORDER — ALPRAZOLAM 0.25 MG PO TABS
0.2500 mg | ORAL_TABLET | Freq: Every day | ORAL | Status: DC
  Administered 2024-10-02 – 2024-10-05 (×4): 0.25 mg via ORAL
  Filled 2024-10-02 (×4): qty 1

## 2024-10-02 MED ORDER — HYDROMORPHONE HCL 1 MG/ML IJ SOLN
0.5000 mg | Freq: Once | INTRAMUSCULAR | Status: AC
  Administered 2024-10-02: 0.5 mg via INTRAVENOUS
  Filled 2024-10-02: qty 0.5

## 2024-10-02 MED ORDER — MIRTAZAPINE 15 MG PO TABS
7.5000 mg | ORAL_TABLET | Freq: Every day | ORAL | Status: DC
  Administered 2024-10-02 – 2024-10-06 (×5): 7.5 mg via ORAL
  Filled 2024-10-02 (×5): qty 1

## 2024-10-02 MED ORDER — GABAPENTIN 400 MG PO CAPS
400.0000 mg | ORAL_CAPSULE | Freq: Two times a day (BID) | ORAL | Status: DC | PRN
  Administered 2024-10-03 – 2024-10-06 (×5): 400 mg via ORAL
  Filled 2024-10-02 (×5): qty 1

## 2024-10-02 MED ORDER — HYDROMORPHONE HCL 1 MG/ML IJ SOLN
0.5000 mg | INTRAMUSCULAR | Status: DC | PRN
  Administered 2024-10-02 – 2024-10-07 (×24): 0.5 mg via INTRAVENOUS
  Filled 2024-10-02 (×26): qty 0.5

## 2024-10-02 MED ORDER — SOFOSBUVIR-VELPATASVIR 400-100 MG PO TABS: 1.0000 | ORAL_TABLET | Freq: Every day | ORAL | Status: DC

## 2024-10-02 MED ORDER — CYCLOBENZAPRINE HCL 5 MG PO TABS
5.0000 mg | ORAL_TABLET | Freq: Every day | ORAL | Status: DC | PRN
  Administered 2024-10-02 – 2024-10-06 (×5): 5 mg via ORAL
  Filled 2024-10-02 (×5): qty 1

## 2024-10-02 MED ORDER — LACTATED RINGERS IV BOLUS
1000.0000 mL | Freq: Once | INTRAVENOUS | Status: AC
  Administered 2024-10-02: 1000 mL via INTRAVENOUS

## 2024-10-02 MED ORDER — LACTATED RINGERS IV SOLN: INTRAVENOUS | Status: DC

## 2024-10-02 MED ORDER — OXYCODONE-ACETAMINOPHEN 10-325 MG PO TABS: 1.0000 | ORAL_TABLET | Freq: Four times a day (QID) | ORAL | Status: DC | PRN

## 2024-10-02 MED ORDER — PANTOPRAZOLE SODIUM 40 MG IV SOLR
40.0000 mg | Freq: Once | INTRAVENOUS | Status: AC
  Administered 2024-10-02: 40 mg via INTRAVENOUS
  Filled 2024-10-02: qty 10

## 2024-10-02 MED ORDER — DOXYCYCLINE HYCLATE 100 MG PO TABS
100.0000 mg | ORAL_TABLET | Freq: Two times a day (BID) | ORAL | Status: DC
  Administered 2024-10-02 – 2024-10-03 (×3): 100 mg via ORAL
  Filled 2024-10-02 (×3): qty 1

## 2024-10-02 MED ORDER — DOXYCYCLINE HYCLATE 100 MG PO CAPS: 100.0000 mg | ORAL_CAPSULE | Freq: Two times a day (BID) | ORAL | 0 refills | Status: DC

## 2024-10-02 MED ORDER — CEPHALEXIN 500 MG PO CAPS
500.0000 mg | ORAL_CAPSULE | Freq: Four times a day (QID) | ORAL | Status: DC
  Administered 2024-10-02 – 2024-10-03 (×5): 500 mg via ORAL
  Filled 2024-10-02: qty 1
  Filled 2024-10-02: qty 2
  Filled 2024-10-02: qty 1
  Filled 2024-10-02: qty 2
  Filled 2024-10-02: qty 1

## 2024-10-02 MED ORDER — HYDROMORPHONE HCL 1 MG/ML IJ SOLN
1.0000 mg | Freq: Once | INTRAMUSCULAR | Status: AC
  Administered 2024-10-02: 1 mg via INTRAVENOUS
  Filled 2024-10-02: qty 1

## 2024-10-02 MED ORDER — IOHEXOL 300 MG/ML  SOLN
100.0000 mL | Freq: Once | INTRAMUSCULAR | Status: AC | PRN
  Administered 2024-10-02: 100 mL via INTRAVENOUS

## 2024-10-02 MED ORDER — NICOTINE 21 MG/24HR TD PT24
21.0000 mg | MEDICATED_PATCH | Freq: Every day | TRANSDERMAL | Status: DC
  Administered 2024-10-02 – 2024-10-07 (×6): 21 mg via TRANSDERMAL
  Filled 2024-10-02 (×6): qty 1

## 2024-10-02 MED ORDER — ONDANSETRON 4 MG PO TBDP: 4.0000 mg | ORAL_TABLET | Freq: Three times a day (TID) | ORAL | 0 refills | Status: AC | PRN

## 2024-10-02 MED ORDER — QUETIAPINE FUMARATE 100 MG PO TABS
100.0000 mg | ORAL_TABLET | Freq: Every day | ORAL | Status: DC
  Administered 2024-10-02 – 2024-10-06 (×5): 100 mg via ORAL
  Filled 2024-10-02 (×5): qty 1

## 2024-10-02 MED ORDER — CEPHALEXIN 500 MG PO CAPS: 500.0000 mg | ORAL_CAPSULE | Freq: Four times a day (QID) | ORAL | 0 refills | Status: DC

## 2024-10-02 MED ORDER — METOCLOPRAMIDE HCL 5 MG/ML IJ SOLN
10.0000 mg | Freq: Once | INTRAMUSCULAR | Status: AC
  Administered 2024-10-02: 10 mg via INTRAVENOUS
  Filled 2024-10-02: qty 2

## 2024-10-02 MED ORDER — OXYCODONE-ACETAMINOPHEN 10-325 MG PO TABS: 1.0000 | ORAL_TABLET | Freq: Four times a day (QID) | ORAL | 0 refills | Status: DC | PRN

## 2024-10-02 MED ORDER — OXYCODONE-ACETAMINOPHEN 5-325 MG PO TABS
1.0000 | ORAL_TABLET | Freq: Four times a day (QID) | ORAL | Status: DC | PRN
  Administered 2024-10-02 – 2024-10-07 (×10): 1 via ORAL
  Filled 2024-10-02 (×10): qty 1

## 2024-10-02 MED ORDER — ONDANSETRON HCL 4 MG/2ML IJ SOLN
4.0000 mg | Freq: Once | INTRAMUSCULAR | Status: AC
  Administered 2024-10-02: 4 mg via INTRAVENOUS
  Filled 2024-10-02: qty 2

## 2024-10-02 NOTE — H&P (Signed)
 " History and Physical    Patient: Danny [name] FMW:[id number] DOB: July 25, 1966 DOA: 10/02/2024 DOS: the patient was seen and examined on 10/02/2024 PCP: Vick Lurie, FNP  Patient coming from: Home  Chief Complaint:  Chief Complaint  Patient presents with   Abdominal Pain   HPI: Danny [name] is a 59 y.o. 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  Patient seen earlier today in the emergency department with concerns of nausea, vomiting, diarrhea, possible rectal bleeding.  He had a workup which was largely negative including a Hemoccult.  He was offered admission, but declined and was subsequently discharged from the emergency department with plans to pick up pain medicine at the pharmacy.  However he was unable to pick up the pain medicine because he is not due for a pain medicine refill until 1/31.  He will return still vomiting and still having pain.  Denies fevers, chills, nausea, vomiting.  He has been having to increase his pain medicine due to the pain in his foot after surgery, which is why he ran out.  Review of Systems: As mentioned in the history of present illness. All other systems reviewed and are negative. Past Medical History:  Diagnosis Date   Alcohol abuse    Anxiety    Back pain    Cancer (HCC)    Cirrhosis (HCC)    CVA (cerebral infarction)    Depression    Diabetes mellitus    Hepatitis C    Hypertension    Migraines    Stroke [facility])    Past Surgical History:  Procedure Laterality Date   ABDOMINAL AORTOGRAM W/LOWER EXTREMITY Right 05/06/2022   Procedure: ABDOMINAL AORTOGRAM W/LOWER EXTREMITY;  Surgeon: Sheree Penne Bruckner, MD;  [facility] INVASIVE CV LAB;  Service: Cardiovascular;  Laterality: Right;   AMPUTATION TOE Right 08/27/2024   Procedure: AMPUTATION,  TOE;  Surgeon: Malvin Marsa FALCON, DPM;  Location: [facility] OR;  Service: Orthopedics/Podiatry;  Laterality: Right;  R hallux amp   CAST APPLICATION Right 11/02/2021   Procedure: CAST APPLICATION;  Surgeon: Margrette Taft BRAVO, MD;  Location: [facility] ORS;  Service: Orthopedics;  Laterality: Right;   ESOPHAGOGASTRODUODENOSCOPY (EGD) WITH PROPOFOL  N/A 09/16/2022   Procedure: ESOPHAGOGASTRODUODENOSCOPY (EGD) WITH PROPOFOL ;  Surgeon: Cindie Carlin POUR, DO;  Location: [facility] ENDO SUITE;  Service: Endoscopy;  Laterality: N/A;  12:30pm, asa 3, pt knows to arrive at 6:45   FOOT SURGERY     INCISION AND DRAINAGE Right 11/02/2021   Procedure: INCISION AND DRAINAGE RIGHT KNEE;  Surgeon: Margrette Taft BRAVO, MD;  Location: [facility] ORS;  Service: Orthopedics;  Laterality: Right;   IR 3D INDEPENDENT WKST  10/11/2021   IR 3D INDEPENDENT WKST  02/28/2022   IR 3D INDEPENDENT WKST  03/14/2022   IR 3D INDEPENDENT WKST  12/18/2023   IR 3D INDEPENDENT WKST  12/18/2023   IR 3D INDEPENDENT WKST  12/18/2023   IR ANGIOGRAM SELECTIVE EACH ADDITIONAL VESSEL  10/11/2021   IR ANGIOGRAM SELECTIVE EACH ADDITIONAL VESSEL  10/11/2021   IR ANGIOGRAM SELECTIVE EACH ADDITIONAL VESSEL  10/11/2021   IR ANGIOGRAM SELECTIVE EACH ADDITIONAL VESSEL  10/11/2021   IR ANGIOGRAM SELECTIVE EACH ADDITIONAL VESSEL  10/11/2021   IR ANGIOGRAM SELECTIVE EACH ADDITIONAL VESSEL  10/11/2021   IR ANGIOGRAM SELECTIVE EACH ADDITIONAL VESSEL  10/25/2021   IR ANGIOGRAM SELECTIVE EACH ADDITIONAL VESSEL  10/25/2021  IR ANGIOGRAM SELECTIVE EACH ADDITIONAL VESSEL  02/28/2022   IR ANGIOGRAM SELECTIVE EACH ADDITIONAL VESSEL  02/28/2022   IR ANGIOGRAM SELECTIVE EACH ADDITIONAL VESSEL  02/28/2022   IR ANGIOGRAM SELECTIVE EACH ADDITIONAL VESSEL  03/14/2022   IR ANGIOGRAM SELECTIVE EACH ADDITIONAL VESSEL  12/18/2023   IR ANGIOGRAM SELECTIVE EACH ADDITIONAL VESSEL  12/18/2023   IR ANGIOGRAM VISCERAL SELECTIVE  10/11/2021   IR ANGIOGRAM VISCERAL SELECTIVE  02/28/2022   IR ANGIOGRAM VISCERAL SELECTIVE   03/14/2022   IR ANGIOGRAM VISCERAL SELECTIVE  12/18/2023   IR EMBO TUMOR ORGAN ISCHEMIA INFARCT INC GUIDE ROADMAPPING  10/25/2021   IR EMBO TUMOR ORGAN ISCHEMIA INFARCT INC GUIDE ROADMAPPING  10/25/2021   IR EMBO TUMOR ORGAN ISCHEMIA INFARCT INC GUIDE ROADMAPPING  03/14/2022   IR EMBO TUMOR ORGAN ISCHEMIA INFARCT INC GUIDE ROADMAPPING  12/18/2023   IR RADIOLOGIST EVAL & MGMT  08/23/2021   IR RADIOLOGIST EVAL & MGMT  11/21/2021   IR RADIOLOGIST EVAL & MGMT  01/24/2022   IR RADIOLOGIST EVAL & MGMT  04/09/2022   IR RADIOLOGIST EVAL & MGMT  08/05/2022   IR RADIOLOGIST EVAL & MGMT  11/29/2022   IR RADIOLOGIST EVAL & MGMT  06/11/2023   IR RADIOLOGIST EVAL & MGMT  11/17/2023   IR RADIOLOGIST EVAL & MGMT  02/06/2024   IR RADIOLOGIST EVAL & MGMT  07/05/2024   IR US  GUIDE VASC ACCESS RIGHT  10/11/2021   IR US  GUIDE VASC ACCESS RIGHT  10/25/2021   IR US  GUIDE VASC ACCESS RIGHT  02/28/2022   IR US  GUIDE VASC ACCESS RIGHT  03/14/2022   IR US  GUIDE VASC ACCESS RIGHT  12/18/2023   MANDIBLE SURGERY     PERIPHERAL VASCULAR INTERVENTION Right 05/06/2022   Procedure: PERIPHERAL VASCULAR INTERVENTION;  Surgeon: Sheree Penne Bruckner, MD;  Location: [facility] INVASIVE CV LAB;  Service: Cardiovascular;  Laterality: Right;  SFA   QUADRICEPS TENDON REPAIR Right 09/04/2021   Procedure: REPAIR QUADRICEP TENDON;  Surgeon: Margrette Taft BRAVO, MD;  Location: [facility] ORS;  Service: Orthopedics;  Laterality: Right;   TENDON EXPLORATION Right 11/02/2021   Procedure: QUADRICEPS TENDON AND PATELLAR TENDON EXPLORATION;  Surgeon: Margrette Taft BRAVO, MD;  Location: [facility] ORS;  Service: Orthopedics;  Laterality: Right;   Social History:  reports that he has been smoking cigarettes. He has a 10 pack-year smoking history. He has been exposed to tobacco smoke. He has never used smokeless tobacco. He reports that he does not currently use alcohol. He reports that he does not currently use drugs after having used the following drugs:  Marijuana.  Allergies[1]  Family History  Problem Relation Age of Onset   Heart failure Mother    Stroke Father    Stroke Sister    Cancer Sister        breast cancer   Colon cancer Neg Hx    Liver cancer Neg Hx     Prior to Admission medications  Medication Sig Start Date End Date Taking? Authorizing Provider  albuterol  (VENTOLIN  HFA) 108 (90 Base) MCG/ACT inhaler Inhale 2 puffs into the lungs every 6 (six) hours as needed for wheezing or shortness of breath.    [provider]  ALPRAZolam  (XANAX ) 0.25 MG tablet Take 0.25 mg by mouth at bedtime.    [provider]  cephALEXin  (KEFLEX ) 500 MG capsule Take 1 capsule (500 mg total) by mouth 4 (four) times daily for 10 days. 10/02/24 10/12/24  Daralene Bruckner D, PA-C  Cholecalciferol  (VITAMIN D -3) 125 MCG (5000 UT) TABS  Take 5,000 Units by mouth daily.    [provider]  citalopram  (CELEXA ) 20 MG tablet Take 20 mg by mouth daily.    [provider]  cyclobenzaprine  (FLEXERIL ) 5 MG tablet Take 5 mg by mouth daily as needed for muscle spasms. 03/06/23   [provider]  doxycycline  (VIBRAMYCIN ) 100 MG capsule Take 1 capsule (100 mg total) by mouth 2 (two) times daily. 10/02/24   Daralene Lonni BIRCH, PA-C  gabapentin  (NEURONTIN ) 400 MG capsule Take 400 mg by mouth 2 (two) times daily as needed. 04/17/24   [provider]  Melatonin 10 MG CAPS Take 10 mg by mouth at bedtime.    [provider]  metFORMIN  (GLUCOPHAGE ) 1000 MG tablet Take 1,000 mg by mouth 2 (two) times daily. 02/10/24   [provider]  mirtazapine  (REMERON ) 7.5 MG tablet Take 1 tablet every day by oral route for 90 days. 11/17/23   [provider]  naloxone [facility]) nasal spray 4 mg/0.1 mL Place 1 spray into the nose once. 01/01/22   [provider]  ondansetron  (ZOFRAN ) 8 MG tablet Take 1 tablet (8 mg total) by mouth every 8 (eight) hours as needed for nausea or vomiting. 03/22/24   Davonna Siad, MD  ondansetron  (ZOFRAN -ODT) 4 MG disintegrating tablet Take 1 tablet (4 mg total) by mouth every 8 (eight) hours as needed for nausea or vomiting. 10/02/24   Daralene Lonni BIRCH, PA-C  oxyCODONE -acetaminophen  (PERCOCET) 10-325 MG tablet Take 1 tablet by mouth every 4 (four) hours as needed for pain. Patient taking differently: Take 1 tablet by mouth in the morning, at noon, in the evening, and at bedtime. 03/04/24   Kommor, [place], MD  oxyCODONE -acetaminophen  (PERCOCET) 10-325 MG tablet Take 1 tablet by mouth every 6 (six) hours as needed for pain. 10/02/24   Daralene Lonni BIRCH, PA-C  prochlorperazine  (COMPAZINE ) 10 MG tablet Take 1 tablet (10 mg total) by mouth every 6 (six) hours as needed for nausea or vomiting. 03/22/24   Davonna Siad, MD  QUEtiapine  (SEROQUEL ) 100 MG tablet Take 100 mg by mouth at bedtime.    [provider]  Sofosbuvir -Velpatasvir  400-100 MG TABS Take 1 tablet by mouth daily.    [provider]    Physical Exam: Vitals:   10/02/24 1653  BP: (!) 170/76  Pulse: 92  Resp: 16  Temp: 98 F (36.7 C)  TempSrc: Oral  SpO2: 100%   General: Middle-age male who appears older than stated age. Awake and alert and oriented x3. No acute cardiopulmonary distress.  HEENT: Normocephalic atraumatic.  Right and left ears normal in appearance.  Pupils equal, round, reactive to light. Extraocular muscles are intact. Sclerae anicteric and noninjected.  Moist mucosal membranes. No mucosal lesions.  Neck: Neck supple without lymphadenopathy. No carotid bruits. No masses palpated.  Cardiovascular: Regular rate with normal S1-S2 sounds. No murmurs, rubs, gallops auscultated. No JVD.  Respiratory: Good respiratory effort with no wheezes, rales, rhonchi. Lungs clear to auscultation bilaterally.  No accessory muscle use. Abdomen: Soft, diffusely tender but without rebound or guarding, nondistended. Active bowel sounds. No masses or hepatosplenomegaly  Skin: No  rashes, lesions, or ulcerations.  Dry, warm to touch. 2+ dorsalis pedis and radial pulses. Musculoskeletal: No calf or leg pain. All major joints not erythematous nontender.  No upper or lower joint deformation.  Good ROM.  No contractures  Psychiatric: Intact judgment and insight. Pleasant and cooperative. Neurologic: No focal neurological deficits. Strength is 5/5 and symmetric in upper and  lower extremities.  Cranial nerves II through XII are grossly intact.  Data Reviewed: Labs and imaging reviewed by me  Assessment and Plan: No notes have been filed under this hospital service. Service: Hospitalist  Principal Problem:   Intractable vomiting Active Problems:   SYNDROME, CHRONIC PAIN   Gastroesophageal reflux disease   Hepatocellular carcinoma (HCC)   Hypertension   Type 2 diabetes mellitus (HCC)   Essential tremor   Stage 3a chronic kidney disease (HCC)   Rectal bleeding  Intractable vomiting. Component of this is likely due to withdrawal from elevated chronic pain medicines and being without pain medicine over the last 24 hours. Restart pain medicine Advance diet as tolerated Antiemetics IV fluids Repeat CBC and CMP in the morning Lactic acidosis Secondary to intractable nausea vomiting Repeat lactic acid now Continue IV fluids Chronic pain Restart patient's chronic pain meds Diabetes Sliding scale Hypertension Continue antihypertensives History of CVA Acute on chronic kidney disease IV fluids Recheck creatinine in the morning Hepatocellular carcinoma   Advance Care Planning:   Code Status: Prior DNR confirmed by patient  Consults: None  Family Communication: None  Severity of Illness: The appropriate patient status for this patient is INPATIENT. Inpatient status is judged to be reasonable and necessary in order to provide the required intensity of service to ensure the patient's safety. The patient's presenting symptoms, physical exam findings, and initial  radiographic and laboratory data in the context of their chronic comorbidities is felt to place them at high risk for further clinical deterioration. Furthermore, it is not anticipated that the patient will be medically stable for discharge from the hospital within 2 midnights of admission.   * I certify that at the point of admission it is my clinical judgment that the patient will require inpatient hospital care spanning beyond 2 midnights from the point of admission due to high intensity of service, high risk for further deterioration and high frequency of surveillance required.*  Author: Christna Kulick J Naarah Borgerding, DO 10/02/2024 5:23 PM  For on call review [email or web address].     [1]  Allergies Allergen Reactions   Trazodone Other (See Comments)    Caused him to sleep walk   Benadryl  [Diphenhydramine ] Itching and Rash   Ultram  [Tramadol ] Hives   "

## 2024-10-02 NOTE — ED Triage Notes (Signed)
 Pt recently discharged from this ED, was here for abdominal pain and rectal bleeding. States, I went to pick up my pain medicine but the pharmacy wouldn't fill it because of my insurance so came back for admission to hospital.

## 2024-10-02 NOTE — Discharge Instructions (Signed)
 Please follow-up closely with your primary care doctor and your pain management specialist on outpatient basis.  Please take all antibiotics as directed.  Return to emergency department immediately for any new or worsening symptoms.

## 2024-10-02 NOTE — Progress Notes (Signed)
 Lactic acid returned at 3.6.  It had improved from 3.5 to 2.4 earlier in the day after fluid boluses.  However, he has continued to have vomiting since he left the hospital.  We will rebolus with IV fluids and start on maintenance fluids.  Recheck lactic acid after bolus.  Patient is fairly dehydrated on exam so will continue with IV hydration.  Lactic acid could also be elevated due to liver disease.  If not improving with fluid boluses and the patient remains stable, we will cautiously observe.

## 2024-10-02 NOTE — ED Provider Notes (Signed)
 " [place] EMERGENCY DEPARTMENT AT [facility] Provider Note   CSN: [id number] Arrival date & time: 10/02/24  1116     Patient presents with: Rectal Bleeding   Danny [name] is a 59 y.o. male.   Patient is a 59 year old male with a past medical history of stage IV cancer who presents to the emergency department with a chief complaint of ongoing nausea, vomiting, diarrhea, melena, dark urine for approximate the past 3 to 4 days.  He denies any associated fever or chills.  He has had no associated chest pain or shortness of breath.  There has been no dizziness, lightheadedness or dizziness.  He admits to generalized weakness.  He notes he has been unable to tolerate any p.o. intake.   Rectal Bleeding Associated symptoms: vomiting        Prior to Admission medications  Medication Sig Start Date End Date Taking? Authorizing Provider  albuterol  (VENTOLIN  HFA) 108 (90 Base) MCG/ACT inhaler Inhale 2 puffs into the lungs every 6 (six) hours as needed for wheezing or shortness of breath.    [provider]  ALPRAZolam  (XANAX ) 0.25 MG tablet Take 0.25 mg by mouth at bedtime.    [provider]  Cholecalciferol  (VITAMIN D -3) 125 MCG (5000 UT) TABS Take 5,000 Units by mouth daily.    [provider]  citalopram  (CELEXA ) 20 MG tablet Take 20 mg by mouth daily.    [provider]  cyclobenzaprine  (FLEXERIL ) 5 MG tablet Take 5 mg by mouth daily as needed for muscle spasms. 03/06/23   [provider]  gabapentin  (NEURONTIN ) 400 MG capsule Take 400 mg by mouth 2 (two) times daily as needed. 04/17/24   [provider]  Melatonin 10 MG CAPS Take 10 mg by mouth at bedtime.    [provider]  metFORMIN  (GLUCOPHAGE ) 1000 MG tablet Take 1,000 mg by mouth 2 (two) times daily. 02/10/24   [provider]  mirtazapine  (REMERON ) 7.5 MG tablet Take 1 tablet every day by oral route for 90 days. 11/17/23   [provider]   naloxone [facility]) nasal spray 4 mg/0.1 mL Place 1 spray into the nose once. 01/01/22   [provider]  ondansetron  (ZOFRAN ) 8 MG tablet Take 1 tablet (8 mg total) by mouth every 8 (eight) hours as needed for nausea or vomiting. 03/22/24   Davonna Siad, MD  oxyCODONE -acetaminophen  (PERCOCET) 10-325 MG tablet Take 1 tablet by mouth every 4 (four) hours as needed for pain. Patient taking differently: Take 1 tablet by mouth in the morning, at noon, in the evening, and at bedtime. 03/04/24   Kommor, Madison, MD  prochlorperazine  (COMPAZINE ) 10 MG tablet Take 1 tablet (10 mg total) by mouth every 6 (six) hours as needed for nausea or vomiting. 03/22/24   Davonna Siad, MD  QUEtiapine  (SEROQUEL ) 100 MG tablet Take 100 mg by mouth at bedtime.    [provider]  Sofosbuvir -Velpatasvir  400-100 MG TABS Take 1 tablet by mouth daily.    [provider]    Allergies: Trazodone, Benadryl  [diphenhydramine ], and Ultram  [tramadol ]    Review of Systems  Gastrointestinal:  Positive for blood in stool, diarrhea, hematochezia, nausea and vomiting.  All other systems reviewed and are negative.   Updated Vital Signs BP (!) 147/74 (BP Location: Right Arm)   Pulse 99   Temp 98.1 F (36.7 C)   Resp 18   Ht 5' 9 (1.753 m)   Wt 72.6 kg   SpO2 100%  BMI 23.63 kg/m   Physical Exam Vitals and nursing note reviewed. Exam conducted with a chaperone present.  Constitutional:      General: He is not in acute distress.    Appearance: Normal appearance. He is not ill-appearing.  HENT:     Head: Normocephalic and atraumatic.     Nose: Nose normal.     Mouth/Throat:     Mouth: Mucous membranes are moist.  Eyes:     Extraocular Movements: Extraocular movements intact.     Conjunctiva/sclera: Conjunctivae normal.     Pupils: Pupils are equal, round, and reactive to light.  Cardiovascular:     Rate and Rhythm: Normal rate and regular rhythm.     Pulses: Normal pulses.     Heart  sounds: Normal heart sounds. No murmur heard.    No gallop.  Pulmonary:     Effort: Pulmonary effort is normal. No respiratory distress.     Breath sounds: Normal breath sounds. No stridor. No wheezing, rhonchi or rales.  Abdominal:     General: Abdomen is flat. Bowel sounds are normal. There is no distension.     Palpations: Abdomen is soft.     Tenderness: There is no guarding.     Comments: Diffuse tenderness  Genitourinary:    Rectum: Normal.     Comments: Brown stool in the rectal vault, Hemoccult negative Musculoskeletal:        General: Normal range of motion.     Cervical back: Normal range of motion and neck supple. No rigidity or tenderness.  Skin:    General: Skin is warm and dry.  Neurological:     General: No focal deficit present.     Mental Status: He is alert and oriented to person, place, and time. Mental status is at baseline.  Psychiatric:        Mood and Affect: Mood normal.        Behavior: Behavior normal.        Thought Content: Thought content normal.        Judgment: Judgment normal.     (all labs ordered are listed, but only abnormal results are displayed) Labs Reviewed  COMPREHENSIVE METABOLIC PANEL WITH GFR  CBC  LIPASE, BLOOD  URINALYSIS, ROUTINE W REFLEX MICROSCOPIC  POC OCCULT BLOOD, ED  TYPE AND SCREEN    EKG: None  Radiology: No results found.   Procedures   Medications Ordered in the ED  lactated ringers  bolus 1,000 mL (has no administration in time range)  HYDROmorphone  (DILAUDID ) injection 1 mg (has no administration in time range)  ondansetron  (ZOFRAN ) injection 4 mg (has no administration in time range)  pantoprazole  (PROTONIX ) injection 40 mg (has no administration in time range)                                    Medical Decision Making Amount and/or Complexity of Data Reviewed Labs: ordered. Radiology: ordered.  Risk Prescription drug management.   This patient presents to the ED for concern of abdominal pain  differential diagnosis includes acute appendicitis, cholecystitis, bowel striction, diverticulitis, pyelonephritis, kidney stone, pancreatitis, mesenteric ischemia, acute kidney injury, dehydration    Additional history obtained:  Additional history obtained from medical records External records from outside source obtained and reviewed including medical records   Lab Tests:  I Ordered, and personally interpreted labs.  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  Did offer patient admission but he has declined at this point noting that he would like to be discharged home at this time.  Pain has greatly improved with treatment in the emergency department.  CT scan of abdomen and pelvis demonstrated no signs of acute surgical process.  Lactic acid has greatly improved as well.  Suspect that this was secondary to dehydration from his vomiting.  Chest x-ray demonstrated no acute findings.  Will provide additional pain medications on an outpatient basis.  He does have a mild elevation of his lipase and he may have a component of mild pancreatitis.   Patient has stable vital signs at this point with no indication for sepsis.  Do not suspect mesenteric ischemia.  Close follow-up with his oncologist, primary care doctor and pain management doctor was discussed.  Strict turn precautions were discussed for any new or worsening symptoms.  Patient voiced understanding and had no additional questions.  Patient does have findings consistent with possible early cellulitis to the second toe of the right foot and will cover with antibiotics as well.  Patient was fully evaluated by attending physician who is in agreement to plan at this time.  Again patient did decline admission at this point.   Social Determinants of Health:  None        Final diagnoses:  None    ED Discharge Orders     None          Daralene Lonni JONETTA DEVONNA 10/02/24 1537    Suzette Pac, MD 10/02/24 1849  "

## 2024-10-02 NOTE — ED Provider Notes (Signed)
 " [place] EMERGENCY DEPARTMENT AT [facility] Provider Note   CSN: [id number] Arrival date & time: 10/02/24  1640     Patient presents with: Abdominal Pain   Danny [name] is a 59 y.o. male.   Patient is a 59 year old male who was just discharged from the hospital secondary to abdominal pain, nausea, vomiting, diarrhea, possible melena.  Workup did demonstrate a lactic acidosis which was improving.  He was offered admission secondary to his ongoing abdominal pain and nausea but did elect to go home.  Patient notes he was unable to get the meds filled that he was called in and notes that he had recurrence of vomiting since leaving.  He denies any new symptoms at this time.   Abdominal Pain      Prior to Admission medications  Medication Sig Start Date End Date Taking? Authorizing Provider  albuterol  (VENTOLIN  HFA) 108 (90 Base) MCG/ACT inhaler Inhale 2 puffs into the lungs every 6 (six) hours as needed for wheezing or shortness of breath.    [provider]  ALPRAZolam  (XANAX ) 0.25 MG tablet Take 0.25 mg by mouth at bedtime.    [provider]  cephALEXin  (KEFLEX ) 500 MG capsule Take 1 capsule (500 mg total) by mouth 4 (four) times daily for 10 days. 10/02/24 10/12/24  Daralene Bruckner D, PA-C  Cholecalciferol  (VITAMIN D -3) 125 MCG (5000 UT) TABS Take 5,000 Units by mouth daily.    [provider]  citalopram  (CELEXA ) 20 MG tablet Take 20 mg by mouth daily.    [provider]  cyclobenzaprine  (FLEXERIL ) 5 MG tablet Take 5 mg by mouth daily as needed for muscle spasms. 03/06/23   [provider]  doxycycline  (VIBRAMYCIN ) 100 MG capsule Take 1 capsule (100 mg total) by mouth 2 (two) times daily. 10/02/24   Daralene Bruckner BIRCH, PA-C  gabapentin  (NEURONTIN ) 400 MG capsule Take 400 mg by mouth 2 (two) times daily as needed. 04/17/24   [provider]  Melatonin 10 MG CAPS Take 10 mg by mouth at bedtime.    [provider]  metFORMIN  (GLUCOPHAGE ) 1000 MG tablet Take 1,000 mg by mouth 2 (two) times daily. 02/10/24   [provider]  mirtazapine  (REMERON ) 7.5 MG tablet Take 1 tablet every day by oral route for 90 days. 11/17/23   [provider]  naloxone [facility]) nasal spray 4 mg/0.1 mL Place 1 spray into the nose once. 01/01/22   [provider]  ondansetron  (ZOFRAN ) 8 MG tablet Take 1 tablet (8 mg total) by mouth every 8 (eight) hours as needed for nausea or vomiting. 03/22/24   Davonna Siad, MD  ondansetron  (ZOFRAN -ODT) 4 MG disintegrating tablet Take 1 tablet (4 mg total) by mouth every 8 (eight) hours as needed for nausea or vomiting. 10/02/24   Daralene Bruckner BIRCH, PA-C  oxyCODONE -acetaminophen  (PERCOCET) 10-325 MG tablet Take 1 tablet by mouth every 4 (four) hours as needed for pain. Patient taking differently: Take 1 tablet by mouth in the morning, at noon, in the evening, and at bedtime. 03/04/24   Kommor, Madison, MD  oxyCODONE -acetaminophen  (PERCOCET) 10-325 MG tablet Take 1 tablet by mouth every 6 (six) hours as needed for pain. 10/02/24   Daralene Bruckner BIRCH, PA-C  prochlorperazine  (COMPAZINE ) 10 MG tablet Take 1 tablet (10 mg total) by mouth every 6 (six) hours as needed for nausea or vomiting. 03/22/24   Davonna Siad, MD  QUEtiapine  (SEROQUEL ) 100 MG tablet Take 100 mg by mouth at bedtime.  [provider]  Sofosbuvir -Velpatasvir  400-100 MG TABS Take 1 tablet by mouth daily.    [provider]    Allergies: Trazodone, Benadryl  [diphenhydramine ], and Ultram  [tramadol ]    Review of Systems  Gastrointestinal:  Positive for abdominal pain.  All other systems reviewed and are negative.   Updated Vital Signs BP (!) 170/76   Pulse 92   Temp 98 F (36.7 C) (Oral)   Resp 16   SpO2 100%   Physical Exam Vitals and nursing note reviewed.  Constitutional:      General: He is not in acute distress.    Appearance: Normal appearance. He is not  ill-appearing.  HENT:     Head: Normocephalic and atraumatic.     Nose: Nose normal.     Mouth/Throat:     Mouth: Mucous membranes are moist.  Eyes:     Extraocular Movements: Extraocular movements intact.     Conjunctiva/sclera: Conjunctivae normal.     Pupils: Pupils are equal, round, and reactive to light.  Cardiovascular:     Rate and Rhythm: Normal rate and regular rhythm.     Pulses: Normal pulses.     Heart sounds: Normal heart sounds. No murmur heard.    No gallop.  Pulmonary:     Effort: Pulmonary effort is normal. No respiratory distress.     Breath sounds: Normal breath sounds. No stridor. No wheezing, rhonchi or rales.  Abdominal:     General: Abdomen is flat. Bowel sounds are normal.     Palpations: Abdomen is soft.     Tenderness: There is generalized abdominal tenderness.     Hernia: No hernia is present.  Musculoskeletal:        General: Normal range of motion.     Cervical back: Normal range of motion and neck supple.  Skin:    General: Skin is warm and dry.     Findings: No rash.     Comments: Erythema noted to the right second toe, no skin breakdown, ulceration, gangrenous changes  Neurological:     General: No focal deficit present.     Mental Status: He is alert and oriented to person, place, and time. Mental status is at baseline.  Psychiatric:        Mood and Affect: Mood normal.        Behavior: Behavior normal.        Thought Content: Thought content normal.        Judgment: Judgment normal.     (all labs ordered are listed, but only abnormal results are displayed) Labs Reviewed - No data to display  EKG: None  Radiology: CT ABDOMEN PELVIS W CONTRAST Result Date: 10/02/2024 EXAM: CT ABDOMEN AND PELVIS WITH CONTRAST 10/02/2024 01:15:38 PM TECHNIQUE: CT of the abdomen and pelvis was performed with the administration of 100 mL of iohexol  (OMNIPAQUE ) 300 MG/ML solution. Multiplanar reformatted images are provided for review. Automated exposure  control, iterative reconstruction, and/or weight-based adjustment of the mA/kV was utilized to reduce the radiation dose to as low as reasonably achievable. COMPARISON: CT 06/15/2024. CLINICAL HISTORY: Abdominal pain, acute, nonlocalized. 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 FINDINGS: LOWER CHEST: No pleural or pericardial effusion. Scattered coronary calcifications. LIVER: Peripherally enhancing 5.6 cm lesion segment 4a, previously 3.2 cm. Mildly nodular hepatic contour. Heterogenous enhancement in the relatively atrophic lateral left hepatic segment as before. GALLBLADDER AND BILE DUCTS: Gallbladder is unremarkable. No biliary ductal dilatation. SPLEEN: Splenomegaly. Large splenorenal shunt. PANCREAS: No acute abnormality. ADRENAL  GLANDS: No acute abnormality. KIDNEYS, URETERS AND BLADDER: Scattered bilateral urolithiasis, largest 4 mm peripherally in the right lower pole. 2.1 cm exophytic cortical cyst from the lower pole left kidney, stable. No hydronephrosis. No perinephric or periureteral stranding. Urinary bladder is unremarkable. GI AND BOWEL: Stomach demonstrates no acute abnormality. There is no bowel obstruction. PERITONEUM AND RETROPERITONEUM: No ascites. No free air. VASCULATURE: Aorta is normal in caliber. Moderate partially calcified aortoiliac plaque without aneurysm or high grade stenosis. LYMPH NODES: 1 cm enlarged retrocaval lymph node (2:40), new since 06/15/2024. REPRODUCTIVE ORGANS: Mild prostate enlargement. BONES AND SOFT TISSUES: Minimal spondylotic changes in the lumbar spine. Bilateral hip DJD. No acute osseous abnormality. No focal soft tissue abnormality. IMPRESSION: 1. Scattered bilateral urolithiasis, largest 4 mm peripherally in the right lower pole. 2. Enlarging 5.6 cm peripherally enhancing lesion in segment 4A (previously 3.2 cm), compatible with progressive hepatocellular carcinoma. 3. New 1 cm enlarged retrocaval lymph node, concerning for  nodal metastatic disease. Electronically signed by: Dayne Hassell MD 10/02/2024 02:00 PM EST RP Workstation: [id number]   DG Chest Port 1 View Result Date: 10/02/2024 EXAM: 1 VIEW(S) XRAY OF THE CHEST 10/02/2024 12:06:00 PM COMPARISON: Chest radiograph 01/01/2018 CT chest 06/15/2024. CLINICAL HISTORY: Weakness. FINDINGS: LUNGS AND PLEURA: Mildly increased interstitial markings which could reflect COPD. No lobar consolidation. No pleural effusion. No pneumothorax. HEART AND MEDIASTINUM: No acute abnormality of the cardiac and mediastinal silhouettes. BONES AND SOFT TISSUES: Degenerative changes noted in the thoracic spine. IMPRESSION: 1. No acute process. Electronically signed by: Donnice Mania MD 10/02/2024 12:52 PM EST RP Workstation: [id number]     Procedures   Medications Ordered in the ED  HYDROmorphone  (DILAUDID ) injection 0.5 mg (has no administration in time range)  metoCLOPramide  (REGLAN ) injection 10 mg (has no administration in time range)  lactated ringers  infusion (has no administration in time range)                                    Medical Decision Making Patient does remain stable at this time.  Did discuss patient case with Dr. Barbra with the hospitalist service for admission given his ongoing abdominal pain, nausea and vomiting.  Do suspect that there is a component of opiate withdrawal with this but yet patient is unable to obtain any of his medications at home and do feel that he will return to the ER.  Patient has been accepted by the hospitalist at this time.  Did order repeat lactic acid, pain medications, IV fluids, Reglan .  Risk Prescription drug management. Decision regarding hospitalization.        Final diagnoses:  None    ED Discharge Orders     None          Daralene Lonni JONETTA DEVONNA 10/02/24 1721    Bernard Drivers, MD 10/02/24 2018  "

## 2024-10-02 NOTE — ED Triage Notes (Signed)
 Pt presents to ED from home C/O rectal bleeding X 3 days, unable to describe. Also reports urine is dark brown. Stage IV lung, liver cancer, states no treatment

## 2024-10-03 ENCOUNTER — Inpatient Hospital Stay (HOSPITAL_COMMUNITY)

## 2024-10-03 DIAGNOSIS — R1110 Vomiting, unspecified: Secondary | ICD-10-CM | POA: Diagnosis not present

## 2024-10-03 LAB — BASIC METABOLIC PANEL WITH GFR
Anion gap: 10 (ref 5–15)
BUN: 7 mg/dL (ref 6–20)
CO2: 26 mmol/L (ref 22–32)
Calcium: 9.2 mg/dL (ref 8.9–10.3)
Chloride: 107 mmol/L (ref 98–111)
Creatinine, Ser: 0.84 mg/dL (ref 0.61–1.24)
GFR, Estimated: >60 mL/min
Glucose, Bld: 105 mg/dL — ABNORMAL HIGH (ref 70–99)
Potassium: 3.7 mmol/L (ref 3.5–5.1)
Sodium: 143 mmol/L (ref 135–145)

## 2024-10-03 LAB — SEDIMENTATION RATE: Sed Rate: 15 mm/h (ref 0–20)

## 2024-10-03 LAB — CBC
HCT: 32.9 % — ABNORMAL LOW (ref 39.0–52.0)
Hemoglobin: 11.1 g/dL — ABNORMAL LOW (ref 13.0–17.0)
MCH: 30.4 pg (ref 26.0–34.0)
MCHC: 33.7 g/dL (ref 30.0–36.0)
MCV: 90.1 fL (ref 80.0–100.0)
Platelets: 96 10*3/uL — ABNORMAL LOW (ref 150–400)
RBC: 3.65 MIL/uL — ABNORMAL LOW (ref 4.22–5.81)
RDW: 13.1 % (ref 11.5–15.5)
WBC: 3.8 10*3/uL — ABNORMAL LOW (ref 4.0–10.5)
nRBC: 0 % (ref 0.0–0.2)

## 2024-10-03 LAB — LACTIC ACID, PLASMA: Lactic Acid, Venous: 1.3 mmol/L (ref 0.5–1.9)

## 2024-10-03 MED ORDER — GADOBUTROL 1 MMOL/ML IV SOLN
7.0000 mL | Freq: Once | INTRAVENOUS | Status: AC | PRN
  Administered 2024-10-03: 7 mL via INTRAVENOUS

## 2024-10-03 MED ORDER — NICOTINE 21 MG/24HR TD PT24
21.0000 mg | MEDICATED_PATCH | Freq: Once | TRANSDERMAL | Status: AC
  Administered 2024-10-03: 21 mg via TRANSDERMAL
  Filled 2024-10-03: qty 1

## 2024-10-03 NOTE — Progress Notes (Addendum)
 "    Progress Note    Danny [name]  FMW:[id number] DOB: [birth year]  DOA: 10/02/2024 PCP: Vick Lurie, FNP      Brief Narrative:    Medical records reviewed and are as summarized below:  Danny [name] is a 59 y.o. male  with medical history significant of end-stage hepatocellular carcinoma, cirrhosis, chronic pain syndrome, history of CVA, COPD, diabetes with neuropathy status post resection of the right great toe secondary to osteomyelitis.  He initially presented to the ED with abdominal pain, nausea, vomiting, diarrhea, bloody stool and dark urine.  He also complained of pain in the right foot.  He said symptoms have been going on for a few days.  Per ED note, rectal exam showed brown stools and Hemoccult test was negative.  He was given Zofran , Dilaudid , IV fluids and Protonix  in the ED.  He was subsequently discharged from the ED because he was feeling better and he declined admission.  He was discharged on opioid analgesics and Keflex  and doxycycline  for suspected right second toe cellulitis.   However, he returned to the ED later that day because of inability to fill his prescriptions and intractable abdominal pain.  He said he was told that he had just filled Percocet on 09/11/2024 and he was ineligible for new opioid prescriptions (chart review showed he got 112 pills of Percocet on 09/11/2024).  He said he ran out of pain pills because he had to take extra pain medicines for abdominal pain and right foot pain following right big toe amputation.        Assessment/Plan:   Principal Problem:   Intractable vomiting Active Problems:   SYNDROME, CHRONIC PAIN   Gastroesophageal reflux disease   Hepatocellular carcinoma (HCC)   Hypertension   Type 2 diabetes mellitus (HCC)   Essential tremor   Stage 3a chronic kidney disease (HCC)   Rectal bleeding    There is no height or weight on file to calculate BMI.   Nausea, vomiting and bloody stool: Improved.  Hemoccult in  the ED was negative.   Dehydration: Improved.  Discontinue IV fluids.   Lactic acidosis: This was probably due to dehydration. Lactic acid down from 3.6-1.3.   Hepatocellular carcinoma, abdominal pain: Analgesics as needed for pain.   Right second toe redness, swelling and tenderness with possible cellulitis, rule out osteomyelitis: X-ray right second toe showed findings concerning for osteomyelitis.  MRI of the right second toe with contrast has been ordered for further evaluation.  Check ESR and CRP.   History of right great toe amputation (on 08/27/2024) for right great toe osteomyelitis: He saw Dr. Tobie, podiatrist, on 09/15/2024.  Per note, patient has been discharged from his care because the surgical wound had healed.   Comorbidities include liver cirrhosis, hypertension, type II DM, history of stroke.   Diet Order             Diet regular Room service appropriate? Yes; Fluid consistency: Thin  Diet effective now                                  Consultants: None  Procedures: None    Medications:    ALPRAZolam   0.25 mg Oral QHS   cephALEXin   500 mg Oral QID   doxycycline   100 mg Oral BID   mirtazapine   7.5 mg Oral QHS   nicotine   21 mg Transdermal Daily   QUEtiapine   100 mg Oral QHS   Continuous Infusions:   Anti-infectives (From admission, onward)    Start     Dose/Rate Route Frequency Ordered Stop   10/02/24 2200  doxycycline  (VIBRA -TABS) tablet 100 mg        100 mg Oral 2 times daily 10/02/24 1740     10/02/24 1830  cephALEXin  (KEFLEX ) capsule 500 mg        500 mg Oral 4 times daily 10/02/24 1740     10/02/24 1830  Sofosbuvir -Velpatasvir  400-100 MG TABS 1 tablet  Status:  Discontinued        1 tablet Oral Daily 10/02/24 1740 10/03/24 0900              Family Communication/Anticipated D/C date and plan/Code Status   DVT prophylaxis: SCDs Start: 10/02/24 1741     Code Status: Limited: Do not attempt resuscitation  (DNR) -DNR-LIMITED -Do Not Intubate/DNI   Family Communication: None Disposition Plan: Plan to discharge home   Status is: Inpatient Remains inpatient appropriate because: Uncontrolled abdominal pain, suspected right second toe osteomyelitis       Subjective:   Interval events noted.  He complains of abdominal pain.  He also reports pain in the right foot.  No nausea or vomiting today.  Objective:    Vitals:   10/02/24 1742 10/02/24 2126 10/03/24 0147 10/03/24 0509  BP: (!) 150/69 139/72 128/64 117/63  Pulse: 80 78 81 73  Resp: 18 15 15 14   Temp: 97.8 F (36.6 C) 98.2 F (36.8 C) 98.2 F (36.8 C) 98 F (36.7 C)  TempSrc: Oral Oral Oral Oral  SpO2: 97% 94% 93% 94%   No data found.   Intake/Output Summary (Last 24 hours) at 10/03/2024 1400 Last data filed at 10/03/2024 0533 Gross per 24 hour  Intake 4340 ml  Output --  Net 4340 ml   There were no vitals filed for this visit.  Exam:  GEN: NAD SKIN: Warm and dry EYES: No pallor or icterus ENT: MMM CV: RRR PULM: CTA B ABD: soft, ND, right upper quadrant tenderness without rebound tenderness or guarding, +BS CNS: AAO x 3, non focal EXT: Erythema, mild swelling and tenderness of right second toe.  Right big toe surgical site has healed with scab tissue        Data Reviewed:   I have personally reviewed following labs and imaging studies:  Labs: Labs show the following:   Basic Metabolic Panel: Recent Labs  Lab 10/02/24 1141 10/03/24 0442  NA 140 143  K 4.1 3.7  CL 103 107  CO2 21* 26  GLUCOSE 190* 105*  BUN 6 7  CREATININE 0.84 0.84  CALCIUM  9.9 9.2   GFR Estimated Creatinine Clearance: 95.9 mL/min (by C-G formula based on SCr of 0.84 mg/dL). Liver Function Tests: Recent Labs  Lab 10/02/24 1141  AST 34  ALT 22  ALKPHOS 131*  BILITOT 1.1  PROT 7.6  ALBUMIN  4.1   Recent Labs  Lab 10/02/24 1205  LIPASE 59*   No results for input(s): AMMONIA in the last 168 hours. Coagulation  profile No results for input(s): INR, PROTIME in the last 168 hours.  CBC: Recent Labs  Lab 10/02/24 1141 10/03/24 0442  WBC 4.1 3.8*  HGB 13.6 11.1*  HCT 40.0 32.9*  MCV 91.1 90.1  PLT 109* 96*   Cardiac Enzymes: No results for input(s): CKTOTAL, CKMB, CKMBINDEX, TROPONINI in the last 168 hours. BNP (last 3 results) No results for input(s): PROBNP in the last  8760 hours. CBG: No results for input(s): GLUCAP in the last 168 hours. D-Dimer: No results for input(s): DDIMER in the last 72 hours. Hgb A1c: No results for input(s): HGBA1C in the last 72 hours. Lipid Profile: No results for input(s): CHOL, HDL, LDLCALC, TRIG, CHOLHDL, LDLDIRECT in the last 72 hours. Thyroid  function studies: No results for input(s): TSH, T4TOTAL, T3FREE, THYROIDAB in the last 72 hours.  Invalid input(s): FREET3 Anemia work up: No results for input(s): VITAMINB12, FOLATE, FERRITIN, TIBC, IRON, RETICCTPCT in the last 72 hours. Sepsis Labs: Recent Labs  Lab 10/02/24 1141 10/02/24 1211 10/02/24 1352 10/02/24 1721 10/02/24 1958 10/03/24 0442  WBC 4.1  --   --   --   --  3.8*  LATICACIDVEN  --    < > 2.4* 3.6* 3.1* 1.3   < > = values in this interval not displayed.    Microbiology No results found for this or any previous visit (from the past 240 hours).  Procedures and diagnostic studies:  DG Toe 2nd Right Result Date: 10/03/2024 CLINICAL DATA:  [id number] Pain in right toe(s) [id number] EXAM: RIGHT SECOND TOE COMPARISON:  September 01, 2024, August 27, 2024 FINDINGS: Status post surgical amputation of the great toe. There is increased cortical indistinctness along the lateral head of the distal first metatarsal with subcortical lucency at this location. Dorsal spurring of the head of the first MTP with possible indistinctness of the cortex in this location on lateral radiograph. Vascular calcifications. Mild soft tissue edema. Degenerative  changes of the midfoot. Surgical screw within the calcaneus. IMPRESSION: Increased cortical indistinctness along the lateral head of the distal first metatarsal with subcortical lucency at this location. Findings are concerning for osteomyelitis with differential considerations including sequela of repetitive trauma. Recommend correlation with physical exam. Electronically Signed   By: Corean Salter M.D.   On: 10/03/2024 11:00   CT ABDOMEN PELVIS W CONTRAST Result Date: 10/02/2024 EXAM: CT ABDOMEN AND PELVIS WITH CONTRAST 10/02/2024 01:15:38 PM TECHNIQUE: CT of the abdomen and pelvis was performed with the administration of 100 mL of iohexol  (OMNIPAQUE ) 300 MG/ML solution. Multiplanar reformatted images are provided for review. Automated exposure control, iterative reconstruction, and/or weight-based adjustment of the mA/kV was utilized to reduce the radiation dose to as low as reasonably achievable. COMPARISON: CT 06/15/2024. CLINICAL HISTORY: Abdominal pain, acute, nonlocalized. 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 FINDINGS: LOWER CHEST: No pleural or pericardial effusion. Scattered coronary calcifications. LIVER: Peripherally enhancing 5.6 cm lesion segment 4a, previously 3.2 cm. Mildly nodular hepatic contour. Heterogenous enhancement in the relatively atrophic lateral left hepatic segment as before. GALLBLADDER AND BILE DUCTS: Gallbladder is unremarkable. No biliary ductal dilatation. SPLEEN: Splenomegaly. Large splenorenal shunt. PANCREAS: No acute abnormality. ADRENAL GLANDS: No acute abnormality. KIDNEYS, URETERS AND BLADDER: Scattered bilateral urolithiasis, largest 4 mm peripherally in the right lower pole. 2.1 cm exophytic cortical cyst from the lower pole left kidney, stable. No hydronephrosis. No perinephric or periureteral stranding. Urinary bladder is unremarkable. GI AND BOWEL: Stomach demonstrates no acute abnormality. There is no bowel obstruction.  PERITONEUM AND RETROPERITONEUM: No ascites. No free air. VASCULATURE: Aorta is normal in caliber. Moderate partially calcified aortoiliac plaque without aneurysm or high grade stenosis. LYMPH NODES: 1 cm enlarged retrocaval lymph node (2:40), new since 06/15/2024. REPRODUCTIVE ORGANS: Mild prostate enlargement. BONES AND SOFT TISSUES: Minimal spondylotic changes in the lumbar spine. Bilateral hip DJD. No acute osseous abnormality. No focal soft tissue abnormality. IMPRESSION: 1. Scattered bilateral urolithiasis, largest 4 mm peripherally in  the right lower pole. 2. Enlarging 5.6 cm peripherally enhancing lesion in segment 4A (previously 3.2 cm), compatible with progressive hepatocellular carcinoma. 3. New 1 cm enlarged retrocaval lymph node, concerning for nodal metastatic disease. Electronically signed by: Dayne Hassell MD 10/02/2024 02:00 PM EST RP Workstation: [id number]   DG Chest Port 1 View Result Date: 10/02/2024 EXAM: 1 VIEW(S) XRAY OF THE CHEST 10/02/2024 12:06:00 PM COMPARISON: Chest radiograph 01/01/2018 CT chest 06/15/2024. CLINICAL HISTORY: Weakness. FINDINGS: LUNGS AND PLEURA: Mildly increased interstitial markings which could reflect COPD. No lobar consolidation. No pleural effusion. No pneumothorax. HEART AND MEDIASTINUM: No acute abnormality of the cardiac and mediastinal silhouettes. BONES AND SOFT TISSUES: Degenerative changes noted in the thoracic spine. IMPRESSION: 1. No acute process. Electronically signed by: Donnice Mania MD 10/02/2024 12:52 PM EST RP Workstation: [id number]               LOS: 1 day   Ward Boissonneault  [facility]   Pager on [email or web address]. If 7PM-7AM, please contact night-coverage at [email or web address]     10/03/2024, 2:00 PM           "

## 2024-10-03 NOTE — Consult Note (Addendum)
" °  CLINICAL SUPPORT TEAM - WOUND OSTOMY AND CONTINENCE TEAM  CONSULTATION SERVICES   WOC Nurse-Inpatient Note  WOC Nurse Consult Note:  Patient underwent Right first metatarsophalangeal joint toe amputation 08/27/2024 by podiatry, last seen in their office 1/13 with notes stating healed and no follow-up required  Reason for consult: R great toe amputation site  Wound type:  full thickness R great toe surgical site well healing dry scab tissue; 2nd digit noted to be red with question dry brown tissue distal aspect  Pressure Injury POA: NA  Measurement: see nursing flowsheet  Wound bed: dry  Drainage (amount, consistency, odor) dry  Periwound:  dry scaly skin  Dressing procedure/placement/frequency Paint R great toe amputation site and R 2nd digit distal aspect with Betadine daily.  Cover with dry gauze and tape or silicone foam whichever works best.   POC discussed with bedside nurse.  Patient should follow with podiatry as outpatient for any ongoing concerns.   Thank you,    Yamen Castrogiovanni MSN, RN-BC, CWOCN      "

## 2024-10-03 NOTE — Plan of Care (Signed)

## 2024-10-04 DIAGNOSIS — R1110 Vomiting, unspecified: Secondary | ICD-10-CM | POA: Diagnosis not present

## 2024-10-04 LAB — C-REACTIVE PROTEIN: CRP: <0.5 mg/dL

## 2024-10-04 MED ORDER — VANCOMYCIN HCL IN DEXTROSE 1-5 GM/200ML-% IV SOLN
1000.0000 mg | Freq: Two times a day (BID) | INTRAVENOUS | Status: DC
  Administered 2024-10-04 – 2024-10-07 (×6): 1000 mg via INTRAVENOUS
  Filled 2024-10-04 (×6): qty 200

## 2024-10-04 MED ORDER — PIPERACILLIN-TAZOBACTAM 3.375 G IVPB
3.3750 g | Freq: Three times a day (TID) | INTRAVENOUS | Status: DC
  Administered 2024-10-04 – 2024-10-07 (×10): 3.375 g via INTRAVENOUS
  Filled 2024-10-04 (×10): qty 50

## 2024-10-04 MED ORDER — VANCOMYCIN HCL 1500 MG/300ML IV SOLN
1500.0000 mg | Freq: Once | INTRAVENOUS | Status: AC
  Administered 2024-10-04: 1500 mg via INTRAVENOUS
  Filled 2024-10-04: qty 300

## 2024-10-04 NOTE — Progress Notes (Signed)
 Pharmacy Antibiotic Note  Danny [name] is a 59 y.o. male admitted on 10/02/2024 with osteomyelitis.  Pharmacy has been consulted for Vancomycin  and zosyn  dosing.  Plan: Vancomycin  1500mg  IV loading dose, then 1000 mg IV Q 12 hrs. Goal AUC 400-550. Expected AUC: 457 SCr used: 0.84  Zosyn  3.375g IV q8h (4 hour infusion). F/U cxs and clinical progress Monitor V/S, labs and levels as indicated     Temp (24hrs), Avg:98.1 F (36.7 C), Min:97.7 F (36.5 C), Max:98.6 F (37 C)  Recent Labs  Lab 10/02/24 1141 10/02/24 1211 10/02/24 1352 10/02/24 1721 10/02/24 1958 10/03/24 0442  WBC 4.1  --   --   --   --  3.8*  CREATININE 0.84  --   --   --   --  0.84  LATICACIDVEN  --  3.5* 2.4* 3.6* 3.1* 1.3    Estimated Creatinine Clearance: 95.9 mL/min (by C-G formula based on SCr of 0.84 mg/dL).    Allergies[1]  Antimicrobials this admission: zosyn  1/26 >>  Vancomycin  1/26 >>   Microbiology results: No cxs  Thank you for allowing pharmacy to be a part of this patients care.  Bonnee Zertuche, BS Pharm D, BCPS Clinical Pharmacist 10/04/2024 7:30 AM     [1]  Allergies Allergen Reactions   Trazodone Other (See Comments)    Caused him to sleep walk   Benadryl  [Diphenhydramine ] Itching and Rash   Ultram  [Tramadol ] Hives

## 2024-10-04 NOTE — Progress Notes (Signed)
 "    Progress Note    LADAMIEN [name]  FMW:[id number] DOB: May 14, 1966  DOA: 10/02/2024 PCP: Danny Lurie, FNP      Brief Narrative:    Medical records reviewed and are as summarized below:  Danny [name] is a 59 y.o. male  with medical history significant of end-stage hepatocellular carcinoma, cirrhosis, chronic pain syndrome, history of CVA, COPD, diabetes with neuropathy status post resection of the right great toe secondary to osteomyelitis.  He initially presented to the ED with abdominal pain, nausea, vomiting, diarrhea, bloody stool and dark urine.  He also complained of pain in the right foot.  He said symptoms have been going on for a few days.  Per ED note, rectal exam showed brown stools and Hemoccult test was negative.  He was given Zofran , Dilaudid , IV fluids and Protonix  in the ED.  He was subsequently discharged from the ED because he was feeling better and he declined admission.  He was discharged on opioid analgesics and Keflex  and doxycycline  for suspected right second toe cellulitis.   However, he returned to the ED later that day because of inability to fill his prescriptions and intractable abdominal pain.  He said he was told that he had just filled Percocet on 09/11/2024 and he was ineligible for new opioid prescriptions (chart review showed he got 112 pills of Percocet on 09/11/2024).  He said he ran out of pain pills because he had to take extra pain medicines for abdominal pain and right foot pain following right big toe amputation.        Assessment/Plan:   Principal Problem:   Intractable vomiting Active Problems:   SYNDROME, CHRONIC PAIN   Gastroesophageal reflux disease   Hepatocellular carcinoma (HCC)   Hypertension   Type 2 diabetes mellitus (HCC)   Essential tremor   Stage 3a chronic kidney disease (HCC)   Rectal bleeding    There is no height or weight on file to calculate BMI.   Nausea, vomiting and bloody stool: Improved.  Hemoccult in  the ED was negative.   Dehydration: Improved with IV fluids   Lactic acidosis: This was probably due to dehydration. Lactic acid down from 3.6-1.3.   Hepatocellular carcinoma, abdominal pain: Analgesics as needed for pain.  He said he could not fill prescription at the pharmacy after he was discharged from the ED on 10/02/2024.  He had used about his pain pills because he was taking extra pills for severe pain.   Right second toe cellulitis, osteomyelitis and right first metatarsal osteomyelitis: MRI toe concerning for osteomyelitis.  Start IV vancomycin  and Zosyn . Case discussed with Dr. Malvin, podiatrist on-call.  Patient will have to be transferred to [facility] for evaluation by the podiatrist. ESR and CRP were normal  History of right great toe amputation (on 08/27/2024) for right great toe osteomyelitis: He saw Dr. Tobie, podiatrist, on 09/15/2024.  Per note, patient has been discharged from his care because the surgical wound had healed.   Comorbidities include liver cirrhosis, hypertension, type II DM, history of stroke.   Patient will be transferred to [facility] for further evaluation of right foot osteomyelitis.  He is agreeable with the plan.   Called [facility] and patient placement team to notify them of transfer     Diet Order             Diet regular Room service appropriate? Yes; Fluid consistency: Thin  Diet effective now  Consultants: None  Procedures: None    Medications:    ALPRAZolam   0.25 mg Oral QHS   mirtazapine   7.5 mg Oral QHS   nicotine   21 mg Transdermal Daily   nicotine   21 mg Transdermal Once   QUEtiapine   100 mg Oral QHS   Continuous Infusions:  piperacillin -tazobactam (ZOSYN )  IV 3.375 g (10/04/24 1115)   vancomycin        Anti-infectives (From admission, onward)    Start     Dose/Rate Route Frequency Ordered Stop   10/04/24 2030  vancomycin  (VANCOCIN ) IVPB 1000 mg/200  mL premix       Placed in Followed by Linked Group   1,000 mg 200 mL/hr over 60 Minutes Intravenous Every 12 hours 10/04/24 0729     10/04/24 0830  vancomycin  (VANCOREADY) IVPB 1500 mg/300 mL       Placed in Followed by Linked Group   1,500 mg 150 mL/hr over 120 Minutes Intravenous  Once 10/04/24 0729 10/04/24 1021   10/04/24 0800  piperacillin -tazobactam (ZOSYN ) IVPB 3.375 g        3.375 g 12.5 mL/hr over 240 Minutes Intravenous Every 8 hours 10/04/24 0721     10/02/24 2200  doxycycline  (VIBRA -TABS) tablet 100 mg  Status:  Discontinued        100 mg Oral 2 times daily 10/02/24 1740 10/04/24 0700   10/02/24 1830  cephALEXin  (KEFLEX ) capsule 500 mg  Status:  Discontinued        500 mg Oral 4 times daily 10/02/24 1740 10/04/24 0700   10/02/24 1830  Sofosbuvir -Velpatasvir  400-100 MG TABS 1 tablet  Status:  Discontinued        1 tablet Oral Daily 10/02/24 1740 10/03/24 0900              Family Communication/Anticipated D/C date and plan/Code Status   DVT prophylaxis: SCDs Start: 10/02/24 1741     Code Status: Limited: Do not attempt resuscitation (DNR) -DNR-LIMITED -Do Not Intubate/DNI   Family Communication: None Disposition Plan: Plan to discharge home   Status is: Inpatient Remains inpatient appropriate because: Uncontrolled abdominal pain, suspected right second toe osteomyelitis       Subjective:   Interval events noted.  He complains of pain in the right foot and right-sided abdominal pain.  Objective:    Vitals:   10/03/24 0509 10/03/24 1525 10/03/24 2034 10/04/24 0638  BP: 117/63 (!) 119/59 (!) 129/59 138/73  Pulse: 73 83 78 81  Resp: 14 17 18 18   Temp: 98 F (36.7 C) 98.6 F (37 C) 98 F (36.7 C) 97.7 F (36.5 C)  TempSrc: Oral Oral Oral Oral  SpO2: 94% 97% 97% 98%   No data found.   Intake/Output Summary (Last 24 hours) at 10/04/2024 1319 Last data filed at 10/03/2024 1854 Gross per 24 hour  Intake 120 ml  Output --  Net 120 ml    There were no vitals filed for this visit.  Exam:  GEN: NAD SKIN: N EYES: Anicteric ENT: MMM CV: RRR PULM: CTA B ABD: soft, ND, mild right upper quadrant tenderness, +BS CNS: AAO x 3, non focal EXT: Swelling, erythema and tenderness of right second toe.  Right big toe surgical site has healed with scab tissue.       Data Reviewed:   I have personally reviewed following labs and imaging studies:  Labs: Labs show the following:   Basic Metabolic Panel: Recent Labs  Lab 10/02/24 1141 10/03/24 0442  NA 140 143  K 4.1  3.7  CL 103 107  CO2 21* 26  GLUCOSE 190* 105*  BUN 6 7  CREATININE 0.84 0.84  CALCIUM  9.9 9.2   GFR Estimated Creatinine Clearance: 95.9 mL/min (by C-G formula based on SCr of 0.84 mg/dL). Liver Function Tests: Recent Labs  Lab 10/02/24 1141  AST 34  ALT 22  ALKPHOS 131*  BILITOT 1.1  PROT 7.6  ALBUMIN  4.1   Recent Labs  Lab 10/02/24 1205  LIPASE 59*   No results for input(s): AMMONIA in the last 168 hours. Coagulation profile No results for input(s): INR, PROTIME in the last 168 hours.  CBC: Recent Labs  Lab 10/02/24 1141 10/03/24 0442  WBC 4.1 3.8*  HGB 13.6 11.1*  HCT 40.0 32.9*  MCV 91.1 90.1  PLT 109* 96*   Cardiac Enzymes: No results for input(s): CKTOTAL, CKMB, CKMBINDEX, TROPONINI in the last 168 hours. BNP (last 3 results) No results for input(s): PROBNP in the last 8760 hours. CBG: No results for input(s): GLUCAP in the last 168 hours. D-Dimer: No results for input(s): DDIMER in the last 72 hours. Hgb A1c: No results for input(s): HGBA1C in the last 72 hours. Lipid Profile: No results for input(s): CHOL, HDL, LDLCALC, TRIG, CHOLHDL, LDLDIRECT in the last 72 hours. Thyroid  function studies: No results for input(s): TSH, T4TOTAL, T3FREE, THYROIDAB in the last 72 hours.  Invalid input(s): FREET3 Anemia work up: No results for input(s): VITAMINB12, FOLATE,  FERRITIN, TIBC, IRON, RETICCTPCT in the last 72 hours. Sepsis Labs: Recent Labs  Lab 10/02/24 1141 10/02/24 1211 10/02/24 1352 10/02/24 1721 10/02/24 1958 10/03/24 0442  WBC 4.1  --   --   --   --  3.8*  LATICACIDVEN  --    < > 2.4* 3.6* 3.1* 1.3   < > = values in this interval not displayed.    Microbiology No results found for this or any previous visit (from the past 240 hours).  Procedures and diagnostic studies:  MR TOES RIGHT W WO CONTRAST Result Date: 10/03/2024 EXAM: MRI of the right Foot with and without contrast. 10/03/2024 04:12:00 PM TECHNIQUE: Multiplanar multisequence MRI of the right foot was performed with and without the administration of intravenous contrast. CONTRAST: 7 mL of Gadavist . COMPARISON: Radiographs 08/13/2025. CLINICAL HISTORY: Right second toe osteomyelitis. FINDINGS: LISFRANC JOINT: Visualized Lisfranc ligament is intact. No significant Lisfranc interval widening or significant periligamentous edema. BONE MARROW: Abnormal marrow edema and enhancement in the shaft and distal [name] of the 1st metatarsal, in the 1st digit sesamoids, and in the [name] and distal shaft of the 2nd metatarsal as well as the 2nd phalanges suspicious for active osteomyelitis. GREATER AND LESSER MTP JOINTS: No significant joint effusion or osseous erosions. No significant degenerative changes. Normal alignment. SOFT TISSUES: Amputation of the great toe on 08/27/2024. Low grade subcutaneous edema along the amputation site of the great toe, potentially reflecting residual wound or local cellulitis. Mild second and third digit hammertoe deformities. TENDONS: Visualized flexor and extensor tendons are intact without tenosynovitis. IMPRESSION: 1. Marrow edema and enhancement involving the first metatarsal shaft and distal [name], first digit sesamoids, second metatarsal [name] and distal shaft, and the second digit phalanges, suspicious for active osteomyelitis. 2. Low-grade subcutaneous edema  along the great toe amputation site, potentially reflecting residual wound or local cellulitis. 3. Status post great toe amputation. 4. Mild second and third digit hammertoe deformities. 5. Mild degenerative spurring between the middle cuneiform and the base of the second metatarsal. Electronically signed by: Ryan Salvage MD  10/03/2024 04:26 PM EST RP Workstation: [id number]   DG Toe 2nd Right Result Date: 10/03/2024 CLINICAL DATA:  [id number] Pain in right toe(s) [id number] EXAM: RIGHT SECOND TOE COMPARISON:  September 01, 2024, August 27, 2024 FINDINGS: Status post surgical amputation of the great toe. There is increased cortical indistinctness along the lateral [name] of the distal first metatarsal with subcortical lucency at this location. Dorsal spurring of the [name] of the first MTP with possible indistinctness of the cortex in this location on lateral radiograph. Vascular calcifications. Mild soft tissue edema. Degenerative changes of the midfoot. Surgical screw within the calcaneus. IMPRESSION: Increased cortical indistinctness along the lateral [name] of the distal first metatarsal with subcortical lucency at this location. Findings are concerning for osteomyelitis with differential considerations including sequela of repetitive trauma. Recommend correlation with physical exam. Electronically Signed   By: Corean Salter M.D.   On: 10/03/2024 11:00               LOS: 2 days   Giavana Rooke  [facility]   Pager on [email or web address]. If 7PM-7AM, please contact night-coverage at [email or web address]     10/04/2024, 1:19 PM           "

## 2024-10-04 NOTE — Plan of Care (Signed)

## 2024-10-04 NOTE — Progress Notes (Signed)
" °  Transition of Care [facility]) Screening Note   Patient Details  Name: Danny [name] Date of Birth: 18-Sep-1965   Transition of Care [facility]) CM/SW Contact:    Hoy DELENA Bigness, LCSW Phone Number: 10/04/2024, 8:23 AM    Transition of Care Department [facility]) has reviewed patient and no TOC needs have been identified at this time. We will continue to monitor patient advancement through interdisciplinary progression rounds. If new patient transition needs arise, please place a TOC consult.  c  10/04/24 0823  TOC Brief Assessment  Insurance and Status Reviewed  Patient has primary care physician Yes  Home environment has been reviewed From home  Prior level of function: Independent  Prior/Current Home Services No current home services  Social Drivers of Health Review SDOH reviewed no interventions necessary  Readmission risk has been reviewed Yes  Transition of care needs no transition of care needs at this time    "

## 2024-10-05 DIAGNOSIS — M86171 Other acute osteomyelitis, right ankle and foot: Secondary | ICD-10-CM

## 2024-10-05 DIAGNOSIS — R1110 Vomiting, unspecified: Secondary | ICD-10-CM | POA: Diagnosis not present

## 2024-10-05 LAB — CBC
HCT: 35.3 % — ABNORMAL LOW (ref 39.0–52.0)
Hemoglobin: 12.2 g/dL — ABNORMAL LOW (ref 13.0–17.0)
MCH: 31.2 pg (ref 26.0–34.0)
MCHC: 34.6 g/dL (ref 30.0–36.0)
MCV: 90.3 fL (ref 80.0–100.0)
Platelets: 87 10*3/uL — ABNORMAL LOW (ref 150–400)
RBC: 3.91 MIL/uL — ABNORMAL LOW (ref 4.22–5.81)
RDW: 12.9 % (ref 11.5–15.5)
WBC: 3.9 10*3/uL — ABNORMAL LOW (ref 4.0–10.5)
nRBC: 0 % (ref 0.0–0.2)

## 2024-10-05 LAB — GLUCOSE, CAPILLARY
Glucose-Capillary: 199 mg/dL — ABNORMAL HIGH (ref 70–99)
Glucose-Capillary: 109 mg/dL — ABNORMAL HIGH (ref 70–99)
Glucose-Capillary: 208 mg/dL — ABNORMAL HIGH (ref 70–99)

## 2024-10-05 LAB — HEMOGLOBIN A1C
Hgb A1c MFr Bld: 5.2 % (ref 4.8–5.6)
Mean Plasma Glucose: 102.54 mg/dL

## 2024-10-05 MED ORDER — ONDANSETRON HCL 4 MG/2ML IJ SOLN: 4.0000 mg | Freq: Four times a day (QID) | INTRAMUSCULAR | Status: DC | PRN

## 2024-10-05 MED ORDER — ALBUTEROL SULFATE (2.5 MG/3ML) 0.083% IN NEBU: 2.5000 mg | INHALATION_SOLUTION | Freq: Four times a day (QID) | RESPIRATORY_TRACT | Status: DC | PRN

## 2024-10-05 MED ORDER — INSULIN ASPART 100 UNIT/ML IJ SOLN
0.0000 [IU] | Freq: Three times a day (TID) | INTRAMUSCULAR | Status: DC
  Administered 2024-10-05 – 2024-10-06 (×2): 1 [IU] via SUBCUTANEOUS
  Filled 2024-10-05 (×2): qty 1

## 2024-10-05 MED ORDER — CITALOPRAM HYDROBROMIDE 20 MG PO TABS
20.0000 mg | ORAL_TABLET | Freq: Every day | ORAL | Status: DC
  Administered 2024-10-05 – 2024-10-07 (×3): 20 mg via ORAL
  Filled 2024-10-05 (×3): qty 1

## 2024-10-05 NOTE — Consult Note (Signed)
 "  PODIATRY CONSULTATION  NAME Danny [name] MRN [id number] DOB 07-Apr-1966 DOA 10/02/2024   Reason for consult:  Chief Complaint  Patient presents with   Abdominal Pain    Attending/Consulting physician: B. Regalado MD  History of present illness: 59 year old with past medical history significant for end-stage hepatocellular carcinoma, cirrhosis, chronic pain syndrome, history of CVA, COPD, diabetes neuropathy s/p resection of the right great toe secondary to osteomyelitis presented initially complaining of abdominal pain nausea vomiting and diarrhea and bloody stool.  He was also complaining of right foot pain.  Rectal exam performed in the ED showed brown stool and Hemoccult test was negative.  He was subsequently discharged from the ED he was feeling better and declined admission at that time.  Discharged on opioid and antibiotics.  Return later that day because of inability to fill prescriptions and intractable abdominal pain.  Patient transferred from [facility] regarding concern for osteomyelitis in the first metatarsal head and second toe.  Second toe clinically with redness and swelling.  He denies a lot of drainage.  He does smoke.  Discussed MRI findings and concern for residual bone infection and need for second toe and partial second metatarsal resection as well as first metatarsal head resection.  He was in agreement with plan he does not want to lose his forefoot but is willing to proceed with additional revisional surgery in the area at effort of limb salvage.  He does report some pain in the area though admits he does not have a lot of sensation to light touch due to neuropathy.   Past Medical History:  Diagnosis Date   Alcohol abuse    Anxiety    Back pain    Cancer (HCC)    Cirrhosis (HCC)    CVA (cerebral infarction)    Depression    Diabetes mellitus    Hepatitis C    Hypertension    Migraines    Stroke [facility])        Latest Ref Rng & Units 10/05/2024    10:27 AM 10/03/2024    4:42 AM 10/02/2024   11:41 AM  CBC  WBC 4.0 - 10.5 K/uL 3.9  3.8  4.1   Hemoglobin 13.0 - 17.0 g/dL 87.7  88.8  86.3   Hematocrit 39.0 - 52.0 % 35.3  32.9  40.0   Platelets 150 - 400 K/uL 87  96  109        Latest Ref Rng & Units 10/03/2024    4:42 AM 10/02/2024   11:41 AM 08/28/2024    6:48 AM  BMP  Glucose 70 - 99 mg/dL 894  809  897   BUN 6 - 20 mg/dL 7  6  13    Creatinine 0.61 - 1.24 mg/dL 9.15  9.15  9.22   Sodium 135 - 145 mmol/L 143  140  137   Potassium 3.5 - 5.1 mmol/L 3.7  4.1  4.0   Chloride 98 - 111 mmol/L 107  103  103   CO2 22 - 32 mmol/L 26  21  26    Calcium  8.9 - 10.3 mg/dL 9.2  9.9  9.4       Physical Exam: Lower Extremity Exam  Right second toe with erythema and edema with distal.  Dry eschar along the first toe amputation site.  Palpable DP and PT pulses  Absent sensation to light touch     ASSESSMENT/PLAN OF CARE 59 y.o. male with PMHx significant for  end-stage hepatocellular carcinoma,  cirrhosis, chronic pain syndrome, history of CVA, COPD, diabetes neuropathy s/p resection of the right great toe secondary to osteomyelitis  with acute osteomyelitis distal aspect right second toe as well as chronic osteomyelitis first metatarsal head and sesamoids.  WBC 3.8 ESR 15 CRP less than 0.5 XR right foot with concern for erosions at the distal lateral aspect of the first metatarsal head MRI toes right with and without contrast: Evidence of osteomyelitis in the first metatarsal head and sesamoids as well as the second toe and second metatarsal head.  -Discussed with patient I do recommend further surgical intervention given the infection in his first met head and second toe.  Recommend partial second ray amputation and first metatarsal head resection with sesamoid resection.  Likely will close this.  Advised smoking cessation. -He is agreeable to proceed and effort of limb salvage, consent will be ordered.  N.p.o. past midnight for  OR tomorrow afternoon - Continue IV abx broad spectrum pending further culture data - Anticoagulation: Hold pending OR tomorrow - Wound care: None preoperatively - WB status: Likely partial weightbearing in postop shoe following surgery - Will continue to follow   Thank you for the consult.  Please contact me directly with any questions or concerns.           Marolyn JULIANNA Honour, DPM [facility]    [address] [place], [place] [address]                Office [phone]  Fax [phone]     "

## 2024-10-05 NOTE — Evaluation (Signed)
 Occupational Therapy Evaluation Patient Details Name: Danny [name] MRN: [id number] DOB: 07-Dec-1965 Today's Date: 10/05/2024   History of Present Illness   59 yo male who presented 1/24 complaining of abdominal pain, nausea vomiting and diarrhea, bloody stool and R foot pain; pt declined admit and discharged. He returned later that day due to continued pain. MRI demonstrated  osteomyelitis in the first metatarsal head and second toe. Plan for amputation 1/28. PMH: R great toe amp 12/19, HTN, GERD, diabetes, CVA, CKD 3A, PAD, hepatitis C, cirrhosis, HCC, neuropathy, anxiety, and COPD.     Clinical Impressions Danny [name] was evaluated s/p the above admission list. He is mod I with SPC at baseline and reports 5 recent falls. Upon evaluation the pt was limited by R foot pain, R knee buckle, impaired balance and decreased activity tolerance. Overall he demonstrated mod I ability to mobilize and complete ADLs with a SPC. Per chart review, pt has plans for RLE surgery tomorrow and he will need OT follow up post-op to educate on ADLs with new Wbing orders. Pt will benefit from continued acute OT services and assist from family at discharge.      If plan is discharge home, recommend the following:   Assist for transportation;Assistance with cooking/housework     Functional Status Assessment   Patient has had a recent decline in their functional status and demonstrates the ability to make significant improvements in function in a reasonable and predictable amount of time.     Equipment Recommendations   None recommended by OT      Precautions/Restrictions   Precautions Precautions: Fall Restrictions Weight Bearing Restrictions Per Provider Order: No (Likely partial weightbearing in postop shoe following surgery -[place])     Mobility Bed Mobility Overal bed mobility: Independent                  Transfers Overall transfer level: Modified independent                  General transfer comment: with SPC - he will need further assessment post-op      Balance Overall balance assessment: Mild deficits observed, not formally tested           ADL either performed or assessed with clinical judgement   ADL Overall ADL's : Modified independent         General ADL Comments: mod I with SPC on evaluation - he will need further assessment post-op     Vision Baseline Vision/History: 0 No visual deficits Vision Assessment?: No apparent visual deficits     Perception Perception: Within Functional Limits       Praxis Praxis: WFL       Pertinent Vitals/Pain Pain Assessment Pain Assessment: Faces Faces Pain Scale: Hurts little more Pain Location: R foot with WBing Pain Descriptors / Indicators: Discomfort, Guarding Pain Intervention(s): Monitored during session, Limited activity within patient's tolerance     Extremity/Trunk Assessment Upper Extremity Assessment Upper Extremity Assessment: Generalized weakness   Lower Extremity Assessment Lower Extremity Assessment: Defer to PT evaluation   Cervical / Trunk Assessment Cervical / Trunk Assessment: Normal   Communication Communication Communication: No apparent difficulties   Cognition Arousal: Alert Behavior During Therapy: WFL for tasks assessed/performed Cognition: No family/caregiver present to determine baseline             OT - Cognition Comments: likely baseline, will need further education on ADLs with weightbearing change  Following commands: Intact       Cueing  General Comments   Cueing Techniques: Verbal cues  VSS    Home Living Family/patient expects to be discharged to:: Private residence Living Arrangements: Other relatives (niece and her husband) Available Help at Discharge: Family;Available 24 hours/day Type of Home: House Home Access: Stairs to enter [employer] of Steps: 3 Entrance Stairs-Rails:  Right;Left;Can reach both Home Layout: One level     Bathroom Shower/Tub: Producer, Television/film/video: Standard Bathroom Accessibility: Yes   Home Equipment: Agricultural Consultant (2 wheels);Cane - single point;Grab bars - tub/shower;Shower seat;Hand held shower head          Prior Functioning/Environment Prior Level of Function : Needs assist;History of Falls (last six months)             Mobility Comments: SPC for all mobility, ~5 recent falls R knee buckles ADLs Comments: mod I BADLs, family helps with IADLs - pt does not drive    OT Problem List: Decreased activity tolerance;Impaired balance (sitting and/or standing);Pain   OT Treatment/Interventions: Self-care/ADL training;DME and/or AE instruction;Therapeutic activities;Balance training;Patient/family education      OT Goals(Current goals can be found in the care plan section)   Acute Rehab OT Goals Patient Stated Goal: less pain OT Goal Formulation: With patient Time For Goal Achievement: 10/19/24 Potential to Achieve Goals: Good ADL Goals Additional ADL Goal #1: Pt will complete all ADLs with mod I and LRAD while maintaining RLE WBing precautions   OT Frequency:  Min 1X/week       AM-PAC OT 6 Clicks Daily Activity     Outcome Measure Help from another person eating meals?: None Help from another person taking care of personal grooming?: None Help from another person toileting, which includes using toliet, bedpan, or urinal?: None Help from another person bathing (including washing, rinsing, drying)?: None Help from another person to put on and taking off regular upper body clothing?: None Help from another person to put on and taking off regular lower body clothing?: None 6 Click Score: 24   End of Session Nurse Communication: Mobility status  Activity Tolerance: Patient tolerated treatment well Patient left: in bed;with call bell/phone within reach  OT Visit Diagnosis: Unsteadiness on feet  (R26.81);Other abnormalities of gait and mobility (R26.89);Muscle weakness (generalized) (M62.81);Pain                Time: 8866-8851 OT Time Calculation (min): 15 min Charges:  OT General Charges $OT Visit: 1 Visit OT Evaluation $OT Eval Low Complexity: 1 Low  Lucie Kendall, OTR/L Acute Rehabilitation Services Office [phone] Secure Chat Communication Preferred   Lucie JONETTA Kendall 10/05/2024, 1:08 PM

## 2024-10-05 NOTE — Plan of Care (Signed)

## 2024-10-05 NOTE — Progress Notes (Signed)
 " PROGRESS NOTE    Danny [name]  FMW:[id number] DOB: December 05, 1965 DOA: 10/02/2024 PCP: Vick Lurie, FNP   Brief Narrative: 59 year old with past medical history significant for end-stage hepatocellular carcinoma, cirrhosis, chronic pain syndrome, history of CVA, COPD, diabetes neuropathy s/p resection of the right great toe secondary to osteomyelitis presented initially complaining of abdominal pain nausea vomiting and diarrhea and bloody stool.  He was also complaining of right foot pain.  Rectal exam performed in the ED showed brown stool and Hemoccult test was negative.  He was subsequently discharged from the ED he was feeling better and declined admission at that time.  Discharged on opioid and antibiotics.  Return later that day because of inability to fill prescriptions and intractable abdominal pain.  He said he was told he had just filled Percocet on 19/11/2024 and was ineligible for new opioid prescription) chart was reviewed show he got 112 pills of Percocet on 09/11/2024) he reports that he ran out of pain medications, he had to take extra pain medicine for abdominal pain and big toe pain.     Assessment & Plan:   Principal Problem:   Intractable vomiting Active Problems:   SYNDROME, CHRONIC PAIN   Gastroesophageal reflux disease   Hepatocellular carcinoma (HCC)   Hypertension   Type 2 diabetes mellitus (HCC)   Essential tremor   Stage 3a chronic kidney disease (HCC)   Rectal bleeding   1-Nausea, vomiting:  - Nausea and vomiting probably component of withdrawal from  opioid, being without pain medication over 24 hours -Resolved. He has been able to tolerates regular diet.  -PRN Zofran .   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 -MRI: Marrow edema and enhancement involving the first metatarsal shaft and distal head, first digit sesamoids, second metatarsal  head and distal shaft, and the second digit phalanges, suspicious for active osteomyelitis. - Dr. Malvin consulted, plan for Sx tomorrow.  - ESR: 15 CRP : 0.5 - Continue vancomycin  and Zosyn   Lactic acidosis Dehydration, intractable nausea and vomiting - In the setting of hypovolemia, infection and or cirrhosis - Lactic acid peaked at 3.6--- down to 1.3 -Resolved.   Hepatocellular carcinoma Chronic pain Abdominal pain ? Bloody stool --Rectal exam showed brown stool, Hemoccult in the ED was negative. - Mild elevation of lipase - Continue as needed oxycodone  -Continue gabapentin , Flexeril  as needed Monitor HB.  No further bloody stool/  Will consult palliative care for pain management  Hypertension: - Does not appear to be on blood pressure medication at home - Continue to monitor blood pressure currently 117 and 130s  Diabetes type 2: - Sliding scale insulin  - Hold metformin   History of stroke: noted, not on statins or aspirin  prior to admission.  AKI ruled out.         Estimated body mass index is 23.6 kg/m as calculated from the following:   Height as of this encounter: 5' 9 (1.753 m).   Weight as of this encounter: 72.5 kg.   DVT prophylaxis: SCDs Code Status: DNR Family Communication: Care discussed with patient.  Disposition Plan:  Status is: Inpatient Remains inpatient appropriate because: management of osteomyelitis.     Consultants:  Podiatry Palliative care  Procedures:   Antimicrobials:    Subjective: He report no more nausea or vomiting. Denies bloody stool.  He report abdominal pain after pain meds at 7/10.  Objective: Vitals:   10/04/24 2019 10/05/24 0018 10/05/24  9688 10/05/24 0448  BP: (!) 162/74 (!) 133/59 (!) 133/59 (!) 117/56  Pulse: 79 81 81 81  Resp: 16 16 16 18   Temp: 98.1 F (36.7 C) 97.9 F (36.6 C) 97.9 F (36.6 C) 97.9 F (36.6 C)  TempSrc:      SpO2: 100% 97%  97%  Weight:   72.5 kg   Height:   5' 9  (1.753 m)     Intake/Output Summary (Last 24 hours) at 10/05/2024 0717 Last data filed at 10/04/2024 1600 Gross per 24 hour  Intake 55.95 ml  Output --  Net 55.95 ml   Filed Weights   10/05/24 0311  Weight: 72.5 kg    Examination:  General exam: Appears calm and comfortable, icteric   Respiratory system: Clear to auscultation. Respiratory effort normal. Cardiovascular system: S1 & S2 heard, RRR.  Gastrointestinal system: Abdomen is nondistended, soft  tender,  Central nervous system: Alert and oriented. No focal neurological deficits. Extremities:  right foot s/p big toe phalange amputation. Second toe with black eschar, redness.   Data Reviewed: I have personally reviewed following labs and imaging studies  CBC: Recent Labs  Lab 10/02/24 1141 10/03/24 0442  WBC 4.1 3.8*  HGB 13.6 11.1*  HCT 40.0 32.9*  MCV 91.1 90.1  PLT 109* 96*   Basic Metabolic Panel: Recent Labs  Lab 10/02/24 1141 10/03/24 0442  NA 140 143  K 4.1 3.7  CL 103 107  CO2 21* 26  GLUCOSE 190* 105*  BUN 6 7  CREATININE 0.84 0.84  CALCIUM  9.9 9.2   GFR: Estimated Creatinine Clearance: 95.9 mL/min (by C-G formula based on SCr of 0.84 mg/dL). Liver Function Tests: Recent Labs  Lab 10/02/24 1141  AST 34  ALT 22  ALKPHOS 131*  BILITOT 1.1  PROT 7.6  ALBUMIN  4.1   Recent Labs  Lab 10/02/24 1205  LIPASE 59*   No results for input(s): AMMONIA in the last 168 hours. Coagulation Profile: No results for input(s): INR, PROTIME in the last 168 hours. Cardiac Enzymes: No results for input(s): CKTOTAL, CKMB, CKMBINDEX, TROPONINI in the last 168 hours. BNP (last 3 results) No results for input(s): PROBNP in the last 8760 hours. HbA1C: No results for input(s): HGBA1C in the last 72 hours. CBG: No results for input(s): GLUCAP in the last 168 hours. Lipid Profile: No results for input(s): CHOL, HDL, LDLCALC, TRIG, CHOLHDL, LDLDIRECT in the last 72  hours. Thyroid  Function Tests: No results for input(s): TSH, T4TOTAL, FREET4, T3FREE, THYROIDAB in the last 72 hours. Anemia Panel: No results for input(s): VITAMINB12, FOLATE, FERRITIN, TIBC, IRON, RETICCTPCT in the last 72 hours. Sepsis Labs: Recent Labs  Lab 10/02/24 1352 10/02/24 1721 10/02/24 1958 10/03/24 0442  LATICACIDVEN 2.4* 3.6* 3.1* 1.3    No results found for this or any previous visit (from the past 240 hours).       Radiology Studies: MR TOES RIGHT W WO CONTRAST Result Date: 10/03/2024 EXAM: MRI of the right Foot with and without contrast. 10/03/2024 04:12:00 PM TECHNIQUE: Multiplanar multisequence MRI of the right foot was performed with and without the administration of intravenous contrast. CONTRAST: 7 mL of Gadavist . COMPARISON: Radiographs 08/13/2025. CLINICAL HISTORY: Right second toe osteomyelitis. FINDINGS: LISFRANC JOINT: Visualized Lisfranc ligament is intact. No significant Lisfranc interval widening or significant periligamentous edema. BONE MARROW: Abnormal marrow edema and enhancement in the shaft and distal head of the 1st metatarsal, in the 1st digit sesamoids, and in the head and distal shaft of the 2nd metatarsal  as well as the 2nd phalanges suspicious for active osteomyelitis. GREATER AND LESSER MTP JOINTS: No significant joint effusion or osseous erosions. No significant degenerative changes. Normal alignment. SOFT TISSUES: Amputation of the great toe on 08/27/2024. Low grade subcutaneous edema along the amputation site of the great toe, potentially reflecting residual wound or local cellulitis. Mild second and third digit hammertoe deformities. TENDONS: Visualized flexor and extensor tendons are intact without tenosynovitis. IMPRESSION: 1. Marrow edema and enhancement involving the first metatarsal shaft and distal head, first digit sesamoids, second metatarsal head and distal shaft, and the second digit phalanges, suspicious for active  osteomyelitis. 2. Low-grade subcutaneous edema along the great toe amputation site, potentially reflecting residual wound or local cellulitis. 3. Status post great toe amputation. 4. Mild second and third digit hammertoe deformities. 5. Mild degenerative spurring between the middle cuneiform and the base of the second metatarsal. Electronically signed by: Ryan Salvage MD 10/03/2024 04:26 PM EST RP Workstation: [id number]   DG Toe 2nd Right Result Date: 10/03/2024 CLINICAL DATA:  [id number] Pain in right toe(s) [id number] EXAM: RIGHT SECOND TOE COMPARISON:  September 01, 2024, August 27, 2024 FINDINGS: Status post surgical amputation of the great toe. There is increased cortical indistinctness along the lateral head of the distal first metatarsal with subcortical lucency at this location. Dorsal spurring of the head of the first MTP with possible indistinctness of the cortex in this location on lateral radiograph. Vascular calcifications. Mild soft tissue edema. Degenerative changes of the midfoot. Surgical screw within the calcaneus. IMPRESSION: Increased cortical indistinctness along the lateral head of the distal first metatarsal with subcortical lucency at this location. Findings are concerning for osteomyelitis with differential considerations including sequela of repetitive trauma. Recommend correlation with physical exam. Electronically Signed   By: Corean Salter M.D.   On: 10/03/2024 11:00        Scheduled Meds:  ALPRAZolam   0.25 mg Oral QHS   mirtazapine   7.5 mg Oral QHS   nicotine   21 mg Transdermal Daily   QUEtiapine   100 mg Oral QHS   Continuous Infusions:  piperacillin -tazobactam (ZOSYN )  IV 3.375 g (10/05/24 0640)   vancomycin  1,000 mg (10/04/24 2025)     LOS: 3 days    Time spent: 35 Minutes    Kristian Hazzard A Navy Rothschild, MD [facility]   If 7PM-7AM, please contact night-coverage [email or web address]  10/05/2024, 7:17 AM   "

## 2024-10-05 NOTE — Progress Notes (Signed)
" ° ° °  PROCEDURAL EXPEDITER PROGRESS NOTE  Patient Name: Danny [name]  DOB:[birth year] Date of Admission: 10/02/2024  Date of Assessment:10/05/24   -------------------------------------------------------------------------------------------------------------------   Brief clinical summary: Patient is going for right foot first metatarsal head resection,partialsecond ray amputation surgery on 1/28.  Orders in place:   Yes   Communication with surgical team if no orders: none  Labs, test, and orders reviewed: yes  Requires surgical clearance:   No  Barriers noted: none   Intervention provided by [facility] team: none  Barrier resolved:   not applicable   -------------------------------------------------------------------------------------------------------------------  [employer], Rexene LITTIE Kirks Please contact us  directly via secure chat (search for [facility]) or by calling us  at [phone] [facility]).  "

## 2024-10-05 NOTE — TOC Progression Note (Signed)
 Transition of Care [facility]) - Progression Note    Patient Details  Name: Danny [name] MRN: [id number] Date of Birth: 1965/11/14  Transition of Care [facility]) CM/SW Contact  Rosaline JONELLE Joe, RN Phone Number: 10/05/2024, 1:24 PM  Clinical Narrative:    Patient admitted to the hospital with Infection of toes of right foot.  Patient was seen by podiatry and patient is planned for surgery tomorrow - per podiatry - recommended partial second ray amputation and first metatarsal head resection with sesamoid resection in OR.  Patient is a current smoker - resources provided.  CM will continue to follow the patient for IP Care management needs post-surgery.                     Expected Discharge Plan and Services                                               Social Drivers of Health (SDOH) Interventions SDOH Screenings   Food Insecurity: No Food Insecurity (10/02/2024)  Housing: Low Risk (10/02/2024)  Transportation Needs: No Transportation Needs (10/02/2024)  Utilities: Not At Risk (10/02/2024)  Depression (PHQ2-9): Low Risk (07/21/2024)  Tobacco Use: High Risk (10/02/2024)    Readmission Risk Interventions    10/05/2024    1:24 PM 10/04/2024    8:22 AM  Readmission Risk Prevention Plan  Transportation Screening Complete Complete  PCP or Specialist Appt within 5-7 Days Complete Complete  Home Care Screening Complete Complete  Medication Review (RN CM) Complete Complete

## 2024-10-06 ENCOUNTER — Inpatient Hospital Stay (HOSPITAL_COMMUNITY): Admitting: Registered Nurse

## 2024-10-06 ENCOUNTER — Encounter (HOSPITAL_COMMUNITY): Admission: EM | Disposition: A | Payer: Self-pay | Source: Home / Self Care | Attending: Internal Medicine

## 2024-10-06 ENCOUNTER — Inpatient Hospital Stay (HOSPITAL_COMMUNITY)

## 2024-10-06 DIAGNOSIS — I10 Essential (primary) hypertension: Secondary | ICD-10-CM | POA: Diagnosis not present

## 2024-10-06 DIAGNOSIS — F418 Other specified anxiety disorders: Secondary | ICD-10-CM | POA: Diagnosis not present

## 2024-10-06 DIAGNOSIS — M869 Osteomyelitis, unspecified: Secondary | ICD-10-CM

## 2024-10-06 DIAGNOSIS — E1169 Type 2 diabetes mellitus with other specified complication: Secondary | ICD-10-CM | POA: Diagnosis not present

## 2024-10-06 DIAGNOSIS — M86171 Other acute osteomyelitis, right ankle and foot: Secondary | ICD-10-CM | POA: Diagnosis not present

## 2024-10-06 DIAGNOSIS — R1110 Vomiting, unspecified: Secondary | ICD-10-CM | POA: Diagnosis not present

## 2024-10-06 LAB — GLUCOSE, CAPILLARY
Glucose-Capillary: 137 mg/dL — ABNORMAL HIGH (ref 70–99)
Glucose-Capillary: 135 mg/dL — ABNORMAL HIGH (ref 70–99)
Glucose-Capillary: 122 mg/dL — ABNORMAL HIGH (ref 70–99)
Glucose-Capillary: 139 mg/dL — ABNORMAL HIGH (ref 70–99)
Glucose-Capillary: 118 mg/dL — ABNORMAL HIGH (ref 70–99)
Glucose-Capillary: 193 mg/dL — ABNORMAL HIGH (ref 70–99)
Glucose-Capillary: 156 mg/dL — ABNORMAL HIGH (ref 70–99)

## 2024-10-06 LAB — CBC
HCT: 32.1 % — ABNORMAL LOW (ref 39.0–52.0)
Hemoglobin: 11.1 g/dL — ABNORMAL LOW (ref 13.0–17.0)
MCH: 31.1 pg (ref 26.0–34.0)
MCHC: 34.6 g/dL (ref 30.0–36.0)
MCV: 89.9 fL (ref 80.0–100.0)
Platelets: 92 10*3/uL — ABNORMAL LOW (ref 150–400)
RBC: 3.57 MIL/uL — ABNORMAL LOW (ref 4.22–5.81)
RDW: 12.9 % (ref 11.5–15.5)
WBC: 4.5 10*3/uL (ref 4.0–10.5)
nRBC: 0 % (ref 0.0–0.2)

## 2024-10-06 MED ORDER — AMISULPRIDE (ANTIEMETIC) 5 MG/2ML IV SOLN: 10.0000 mg | Freq: Once | INTRAVENOUS | Status: DC | PRN

## 2024-10-06 MED ORDER — ALPRAZOLAM 0.25 MG PO TABS
0.2500 mg | ORAL_TABLET | Freq: Two times a day (BID) | ORAL | Status: DC
  Administered 2024-10-06 – 2024-10-07 (×2): 0.25 mg via ORAL
  Filled 2024-10-06 (×2): qty 1

## 2024-10-06 MED ORDER — FENTANYL CITRATE (PF) 100 MCG/2ML IJ SOLN
INTRAMUSCULAR | Status: AC
  Filled 2024-10-06: qty 2

## 2024-10-06 MED ORDER — LIDOCAINE HCL (PF) 1 % IJ SOLN
INTRAMUSCULAR | Status: AC
  Filled 2024-10-06: qty 30

## 2024-10-06 MED ORDER — MIDAZOLAM HCL 2 MG/2ML IJ SOLN
INTRAMUSCULAR | Status: AC
  Filled 2024-10-06: qty 2

## 2024-10-06 MED ORDER — FENTANYL CITRATE (PF) 100 MCG/2ML IJ SOLN
INTRAMUSCULAR | Status: DC | PRN
  Administered 2024-10-06 (×2): 50 ug via INTRAVENOUS

## 2024-10-06 MED ORDER — OXYCODONE HCL 5 MG PO TABS: 5.0000 mg | ORAL_TABLET | Freq: Once | ORAL | Status: DC | PRN

## 2024-10-06 MED ORDER — DEXMEDETOMIDINE HCL IN NACL 80 MCG/20ML IV SOLN
INTRAVENOUS | Status: AC
  Filled 2024-10-06: qty 20

## 2024-10-06 MED ORDER — ORAL CARE MOUTH RINSE: 15.0000 mL | Freq: Once | OROMUCOSAL | Status: AC

## 2024-10-06 MED ORDER — MIDAZOLAM HCL (PF) 2 MG/2ML IJ SOLN
INTRAMUSCULAR | Status: DC | PRN
  Administered 2024-10-06: 2 mg via INTRAVENOUS

## 2024-10-06 MED ORDER — FENTANYL CITRATE (PF) 100 MCG/2ML IJ SOLN
25.0000 ug | INTRAMUSCULAR | Status: DC | PRN
  Administered 2024-10-06: 50 ug via INTRAVENOUS

## 2024-10-06 MED ORDER — CHLORHEXIDINE GLUCONATE 0.12 % MT SOLN
OROMUCOSAL | Status: AC
  Administered 2024-10-06: 15 mL via OROMUCOSAL
  Filled 2024-10-06: qty 15

## 2024-10-06 MED ORDER — CHLORHEXIDINE GLUCONATE 0.12 % MT SOLN: 15.0000 mL | Freq: Once | OROMUCOSAL | Status: AC

## 2024-10-06 MED ORDER — SENNA 8.6 MG PO TABS: 1.0000 | ORAL_TABLET | Freq: Every day | ORAL | Status: DC | PRN

## 2024-10-06 MED ORDER — PROPOFOL 10 MG/ML IV BOLUS
INTRAVENOUS | Status: DC | PRN
  Administered 2024-10-06: 100 ug/kg/min via INTRAVENOUS
  Administered 2024-10-06: 30 mg via INTRAVENOUS

## 2024-10-06 MED ORDER — LIDOCAINE HCL 1 % IJ SOLN
INTRAMUSCULAR | Status: DC | PRN
  Administered 2024-10-06: 20 mL

## 2024-10-06 MED ORDER — HYDROMORPHONE HCL 1 MG/ML IJ SOLN
0.5000 mg | Freq: Once | INTRAMUSCULAR | Status: AC
  Administered 2024-10-06: 0.5 mg via INTRAVENOUS
  Filled 2024-10-06: qty 0.5

## 2024-10-06 MED ORDER — LACTATED RINGERS IV SOLN: INTRAVENOUS | Status: DC

## 2024-10-06 MED ORDER — OXYCODONE HCL 5 MG/5ML PO SOLN: 5.0000 mg | Freq: Once | ORAL | Status: DC | PRN

## 2024-10-06 MED ORDER — 0.9 % SODIUM CHLORIDE (POUR BTL) OPTIME
TOPICAL | Status: DC | PRN
  Administered 2024-10-06: 1000 mL

## 2024-10-06 MED ORDER — BUPIVACAINE HCL (PF) 0.5 % IJ SOLN
INTRAMUSCULAR | Status: AC
  Filled 2024-10-06: qty 30

## 2024-10-06 NOTE — Progress Notes (Signed)
 History and Physical Interval Note:  10/06/2024 2:05 PM  Danny [name]  has presented today for surgery, with the diagnosis of osteomyelitis distal first metatarsal and distal 2nd ray met head and toe.  The various methods of treatment have been discussed with the patient and family. After consideration of risks, benefits and other options for treatment, the patient has consented to   Procedures with comments: EXCISION, METATARSAL BONE, HEAD (Right) - partial first met resection AMPUTATION, FOOT, RAY (Right) - 2nd partial ray amputation as a surgical intervention.  The patient's history has been reviewed, patient examined, no change in status, stable for surgery.  I have reviewed the patient's chart and labs.  Questions were answered to the patient's satisfaction.     Marsa FALCON [name]

## 2024-10-06 NOTE — Evaluation (Signed)
 Physical Therapy Evaluation Patient Details Name: Danny [name] MRN: [id number] DOB: Sep 04, 1966 Today's Date: 10/06/2024  History of Present Illness  59 yo male who presented 1/24 complaining of abdominal pain, nausea vomiting and diarrhea, bloody stool and R foot pain; pt declined admit and discharged. He returned later that day due to continued pain. MRI demonstrated  osteomyelitis in the first metatarsal head and second toe. Plan for amputation 1/28. PMH: R great toe amp 12/19, HTN, GERD, diabetes, CVA, CKD 3A, PAD, hepatitis C, cirrhosis, HCC, neuropathy, anxiety, and COPD.  Clinical Impression  Pt is currently presenting at ind for bed mobility, Mod I for sit to stand and 40 ft of gait with SPC. Pt is scheduled for surgery today and WB precautions may change current functional mobility but due to nature of surgery not expected to significantly change pt functional status at this time. Pt will most likely benefit from OPPT once healed and WB precautions removed for balance/strength in order to decrease risk for falls.         If plan is discharge home, recommend the following: Help with stairs or ramp for entrance;Assistance with cooking/housework     Equipment Recommendations None recommended by PT     Functional Status Assessment Patient has had a recent decline in their functional status and demonstrates the ability to make significant improvements in function in a reasonable and predictable amount of time.     Precautions / Restrictions Precautions Precautions: Fall Recall of Precautions/Restrictions: Intact Restrictions Weight Bearing Restrictions Per Provider Order: No Other Position/Activity Restrictions: surgery scheduled for 3pm today per pt      Mobility  Bed Mobility Overal bed mobility: Independent      Transfers Overall transfer level: Modified independent Equipment used: Straight cane     General transfer comment: depending on WB precautions may change after  surgery.    Ambulation/Gait Ambulation/Gait assistance: Modified independent (Device/Increase time) Gait Distance (Feet): 40 Feet Assistive device: Straight cane Gait Pattern/deviations: Antalgic, Decreased stance time - right, Step-through pattern Gait velocity: decreased Gait velocity interpretation: <1.31 ft/sec, indicative of household ambulator   General Gait Details: may change after surgery depending on pain/WB precautions     Balance Overall balance assessment: Mild deficits observed, not formally tested       Pertinent Vitals/Pain Pain Assessment Pain Assessment: Faces Faces Pain Scale: Hurts little more Breathing: normal Negative Vocalization: none Facial Expression: smiling or inexpressive Body Language: relaxed Consolability: no need to console PAINAD Score: 0 Pain Location: R foot with WBing Pain Descriptors / Indicators: Discomfort, Guarding Pain Intervention(s): Monitored during session, Limited activity within patient's tolerance    Home Living Family/patient expects to be discharged to:: Private residence Living Arrangements: Other relatives (niece and her husband) Available Help at Discharge: Family;Available 24 hours/day Type of Home: House Home Access: Stairs to enter Entrance Stairs-Rails: Right;Left;Can reach both Entrance Stairs-Number of Steps: 3   Home Layout: One level Home Equipment: Agricultural Consultant (2 wheels);Cane - single point;Grab bars - tub/shower;Shower seat;Hand held shower head      Prior Function Prior Level of Function : Needs assist;History of Falls (last six months)             Mobility Comments: SPC for all mobility, ~5 recent falls R knee buckles ADLs Comments: mod I BADLs, family helps with IADLs - pt does not drive     Extremity/Trunk Assessment   Upper Extremity Assessment Upper Extremity Assessment: Defer to OT evaluation    Lower Extremity Assessment Lower Extremity  Assessment: Generalized weakness;RLE  deficits/detail RLE Deficits / Details: pain in the R foot    Cervical / Trunk Assessment Cervical / Trunk Assessment: Normal  Communication   Communication Communication: No apparent difficulties    Cognition Arousal: Alert Behavior During Therapy: WFL for tasks assessed/performed   PT - Cognitive impairments: No apparent impairments   Following commands: Intact       Cueing Cueing Techniques: Verbal cues     General Comments General comments (skin integrity, edema, etc.): no signs/symptoms of cardiac/respiratory distress during session.        Assessment/Plan    PT Assessment Patient needs continued PT services  PT Problem List Pain;Decreased activity tolerance;Decreased balance       PT Treatment Interventions DME instruction;Balance training;Gait training;Neuromuscular re-education;Stair training;Functional mobility training;Therapeutic activities;Therapeutic exercise;Patient/family education    PT Goals (Current goals can be found in the Care Plan section)  Acute Rehab PT Goals Patient Stated Goal: return home with decreased pain PT Goal Formulation: With patient Time For Goal Achievement: 10/20/24 Potential to Achieve Goals: Good    Frequency Min 2X/week        AM-PAC PT 6 Clicks Mobility  Outcome Measure Help needed turning from your back to your side while in a flat bed without using bedrails?: None Help needed moving from lying on your back to sitting on the side of a flat bed without using bedrails?: None Help needed moving to and from a bed to a chair (including a wheelchair)?: None Help needed standing up from a chair using your arms (e.g., wheelchair or bedside chair)?: None Help needed to walk in hospital room?: None Help needed climbing 3-5 steps with a railing? : A Little 6 Click Score: 23    End of Session Equipment Utilized During Treatment: Gait belt Activity Tolerance: Patient tolerated treatment well Patient left: in bed;with call  bell/phone within reach Nurse Communication: Mobility status PT Visit Diagnosis: Unsteadiness on feet (R26.81);Pain Pain - Right/Left: Right Pain - part of body: Ankle and joints of foot    Time: 1012-1022 PT Time Calculation (min) (ACUTE ONLY): 10 min   Charges:   PT Evaluation $PT Eval Low Complexity: 1 Low   PT General Charges $$ ACUTE PT VISIT: 1 Visit         Dorothyann Maier, DPT, CLT  Acute Rehabilitation Services Office: [phone] (Secure chat preferred)   Dorothyann VEAR Maier 10/06/2024, 10:29 AM

## 2024-10-06 NOTE — Transfer of Care (Signed)
 Immediate Anesthesia Transfer of Care Note  Patient: Danny [name]  Procedure(s) Performed: EXCISION, METATARSAL BONE, HEAD, PARTIAL SECOND RAY AMPUTATION (Right: Toe)  Patient Location: PACU  Anesthesia Type:MAC  Level of Consciousness: awake, alert , and oriented  Airway & Oxygen  Therapy: Patient Spontanous Breathing  Post-op Assessment: Report given to RN and Post -op Vital signs reviewed and stable  Post vital signs: Reviewed and stable  Last Vitals:  Vitals Value Taken Time  BP 102/58 10/06/24 16:09  Temp    Pulse 72 10/06/24 16:10  Resp 12   SpO2 96 % 10/06/24 16:10  Vitals shown include unfiled device data.  Last Pain:  Vitals:   10/06/24 1411  TempSrc:   PainSc: 9          Complications: There were no known notable events for this encounter.

## 2024-10-06 NOTE — Progress Notes (Signed)
 Orthopedic Tech Progress Note Patient Details:  [name] September 18, 1965 [id number]  Patient ID: Danny [name] Clack, male   DOB: 1966/02/02, 59 y.o.   MRN: [id number] Pt.has POS from home [name] 10/06/2024, 5:45 PM

## 2024-10-06 NOTE — Progress Notes (Signed)
 " PROGRESS NOTE    Danny [name]  FMW:[id number] DOB: January 06, 1966 DOA: 10/02/2024 PCP: Vick Lurie, FNP    Brief Narrative:  59 year old with end-stage hepatocellular carcinoma, cirrhosis, chronic pain syndrome, history of stroke, COPD, diabetic neuropathy status post resection of right great toe second toe osteomyelitis on December 19 presented initially with abdominal pain nausea vomiting and diarrhea along with bloody stool.  He was also complaining of right foot pain.  He was subsequently discharged from the ER after feeling better.  Return to ER with intractable pain in his abdomen as well as in his toes.  Apparently, he ran out of the pain medications because he took extra pain pills.  Admitted with podiatry consultation.  Subjective: Patient seen and examined.  Pain is controlled.  Still using IV pain medications.  Aware about surgical procedure today. Assessment & Plan:    Nausea vomiting and abdominal pain: No acute findings in the abdomen.  Has chronic pain syndrome, hepatocellular carcinoma, on chronic opiates. Clinically improved. Continue as needed nausea medications.  Allow regular diet except NPO. Patient on oxycodone , gabapentin , Flexeril  along with bowel regimen.  Right second toe cellulitis and osteomyelitis of the first metatarsal: Right great toe amputation 08/27/2024 Comes back with persistent infection Patient currently on vancomycin  and Zosyn  Scheduled for right foot surgery, likely ray amputation. Adequate pain medications. Not a good candidate for long-term IV antibiotics, hopefully he can have resection to the healthy surgical margins.  Hypertension: Blood pressure is stable.  Not on medications.  Type 2 diabetes: Well-controlled.  He has neuropathy on gabapentin .  On metformin  at home.  Currently holding.  On sliding scale insulin .  History of stroke: Currently stable.    DVT prophylaxis: SCDs Start: 10/02/24 1741   Code Status: DNR/DNI Family  Communication: None at the bedside Disposition Plan: Status is: Inpatient Remains inpatient appropriate because: IV antibiotics, inpatient surgical procedures     Consultants:  Podiatry  Procedures:  None  Antimicrobials:  Vancomycin  and Zosyn  1/24---     Objective: Vitals:   10/05/24 2115 10/06/24 0029 10/06/24 0442 10/06/24 0738  BP: (!) 115/50 (!) 127/52 (!) 113/52 (!) 111/49  Pulse: 83 71 75 71  Resp: 17 18 18 18   Temp: 98.2 F (36.8 C)  97.8 F (36.6 C) 98.5 F (36.9 C)  TempSrc:    Oral  SpO2: 98% 98% 98% 96%  Weight:      Height:        Intake/Output Summary (Last 24 hours) at 10/06/2024 1006 Last data filed at 10/06/2024 0625 Gross per 24 hour  Intake 1540.48 ml  Output --  Net 1540.48 ml   Filed Weights   10/05/24 0311  Weight: 72.5 kg    Examination:  General exam: Appears calm and comfortable.  Frail and debility.  Chronically sick looking.  Not in any distress. Respiratory system: Clear to auscultation. Respiratory effort normal. Cardiovascular system: S1 & S2 heard, RRR. No JVD, murmurs, rubs, gallops or clicks. No pedal edema. Gastrointestinal system: Soft.  Nontender. Central nervous system: Alert and oriented. No focal neurological deficits. Extremities:  Right foot , second toe with black eschar and redness.  Big toe amputation stump with some redness.  No drainage.    Data Reviewed: I have personally reviewed following labs and imaging studies  CBC: Recent Labs  Lab 10/02/24 1141 10/03/24 0442 10/05/24 1027 10/06/24 0230  WBC 4.1 3.8* 3.9* 4.5  HGB 13.6 11.1* 12.2* 11.1*  HCT 40.0 32.9* 35.3* 32.1*  MCV 91.1 90.1  90.3 89.9  PLT 109* 96* 87* 92*   Basic Metabolic Panel: Recent Labs  Lab 10/02/24 1141 10/03/24 0442  NA 140 143  K 4.1 3.7  CL 103 107  CO2 21* 26  GLUCOSE 190* 105*  BUN 6 7  CREATININE 0.84 0.84  CALCIUM  9.9 9.2   GFR: Estimated Creatinine Clearance: 95.9 mL/min (by C-G formula based on SCr of 0.84  mg/dL). Liver Function Tests: Recent Labs  Lab 10/02/24 1141  AST 34  ALT 22  ALKPHOS 131*  BILITOT 1.1  PROT 7.6  ALBUMIN  4.1   Recent Labs  Lab 10/02/24 1205  LIPASE 59*   No results for input(s): AMMONIA in the last 168 hours. Coagulation Profile: No results for input(s): INR, PROTIME in the last 168 hours. Cardiac Enzymes: No results for input(s): CKTOTAL, CKMB, CKMBINDEX, TROPONINI in the last 168 hours. BNP (last 3 results) No results for input(s): PROBNP in the last 8760 hours. HbA1C: Recent Labs    10/05/24 1011  HGBA1C 5.2   CBG: Recent Labs  Lab 10/05/24 1229 10/05/24 1653 10/05/24 2131 10/06/24 0841  GLUCAP 199* 109* 208* 137*   Lipid Profile: No results for input(s): CHOL, HDL, LDLCALC, TRIG, CHOLHDL, LDLDIRECT in the last 72 hours. Thyroid  Function Tests: No results for input(s): TSH, T4TOTAL, FREET4, T3FREE, THYROIDAB in the last 72 hours. Anemia Panel: No results for input(s): VITAMINB12, FOLATE, FERRITIN, TIBC, IRON, RETICCTPCT in the last 72 hours. Sepsis Labs: Recent Labs  Lab 10/02/24 1352 10/02/24 1721 10/02/24 1958 10/03/24 0442  LATICACIDVEN 2.4* 3.6* 3.1* 1.3    No results found for this or any previous visit (from the past 240 hours).       Radiology Studies: No results found.      Scheduled Meds:  ALPRAZolam   0.25 mg Oral QHS   citalopram   20 mg Oral Daily   insulin  aspart  0-6 Units Subcutaneous TID WC   mirtazapine   7.5 mg Oral QHS   nicotine   21 mg Transdermal Daily   QUEtiapine   100 mg Oral QHS   Continuous Infusions:  piperacillin -tazobactam (ZOSYN )  IV 3.375 g (10/06/24 0505)   vancomycin  1,000 mg (10/06/24 0905)     LOS: 4 days      Renato Applebaum, MD [facility]   "

## 2024-10-06 NOTE — Consult Note (Signed)
 "                                                                                   Consultation Note Date: 10/06/2024   Patient Name: Danny [name]  DOB: Jan 13, 1966  MRN: [id number]  Age / Sex: 59 y.o., male  PCP: Danny Lurie, FNP Referring Physician: Raenelle Coria, MD  Reason for Consultation: Establishing goals of care and Pain control  HPI/Patient Profile: 59 y.o. male  with past medical history of advanced hepatocellular, cirrhosis, CVA, COPD, diabetes, neuropathy s/p right great toe amputation due to osteomyelitis admitted on 10/02/2024 with nausea, vomiting, pain in right foot. He ran out of pain medication due to taking extra due to foot pain on top of abd pain. Plans for right ray amputation 1/28.   Clinical Assessment and Goals of Care: Consult received and chart review completed. I met today with Danny [name]. RN at bedside. He complains of severe pain in his foot. Not time for pain medication yet. He confirms plans for amputation this afternoon - he was told ~3pm. He is anxious about surgery.   We spent time discussing his health and underlying cancer. I noted that he was considering further treatment with oncology vs hospice at last visit in Dec 2025. He tells me that he was told further treatment would not really be helpful. He is not planning further oncology treatment. He shares that he has discussed hospice with his family and everyone seems onboard for having hospice support come to him at home. He follows with pain clinic but reports his pain has been worsening in abdomen but also with severe pain in his foot. He is interested in having hospice manage his pain at home. He does report there has been discussion of SNF rehab stay prior to return home. I am hesitant to make significant changes to pain regimen since he does follow with pain clinic. If transition to hospice for management is confirmed then we can adjust more liberally.   Danny [name] shares with me that he lives at home with  his niece, Danny [name], as his main caregiver and his rock. He also has 2 daughters and 3 grandchildren. He becomes tearful talking about his grandchildren as he wants time with them. He tells me that he is not afraid to die but does not want to leave his grandchildren. He talks of many other family members that have died recently which has been difficult on top of his own health issues. He wants quality time with his family and does not want to suffer.   We agree to proceed with amputation and continue conversations to develop a plan moving forward that fits with his goals. All questions/concerns addressed. Emotional support provided.   Primary Decision Maker PATIENT    SUMMARY OF RECOMMENDATIONS   - DNR - Proceed with amputation - Desire for hospice at home (likely after SNF rehab stay)  Code Status/Advance Care Planning: DNR   Symptom Management:  Acute right foot pain: Plans for amputation 1/28 Continue dilaudid  0.5 mg IV q3h PRN (consider q2h PRN for acute post-op pain) Flexeril  5 mg PO daily PRN - consider increased frequency if effective Pain, chronic abd s/t  HCC: Continue oxycodone  10 mg q6h PRN (consider d/c acetaminophen  due to liver disease) - consider titration needs especially after acute post-op period Continue gabapentin  400 mg BID.  Bowel Regimen: LBM 1/28 and at least 1 daily. Senokot 1 tab daily PRN.   Prognosis:  Poor with advanced hepatocellular cancer.   Discharge Planning: To Be Determined      Primary Diagnoses: Present on Admission:  Intractable vomiting  Essential tremor  Gastroesophageal reflux disease  Hepatocellular carcinoma (HCC)  Hypertension  Stage 3a chronic kidney disease (HCC)  SYNDROME, CHRONIC PAIN  Pyogenic inflammation of bone (HCC)   I have reviewed the medical record, interviewed the patient and family, and examined the patient. The following aspects are pertinent.  Past Medical History:  Diagnosis Date   Alcohol abuse     Anxiety    Back pain    Cancer (HCC)    Cirrhosis (HCC)    CVA (cerebral infarction)    Depression    Diabetes mellitus    Hepatitis C    Hypertension    Migraines    Stroke [facility])    Social History   Socioeconomic History   Marital status: Divorced    Spouse name: Not on file   Number of children: Not on file   Years of education: Not on file   Highest education level: Not on file  Occupational History   Not on file  Tobacco Use   Smoking status: Every Day    Current packs/day: 0.50    Average packs/day: 0.5 packs/day for 20.0 years (10.0 ttl pk-yrs)    Types: Cigarettes    Passive exposure: Current   Smokeless tobacco: Never  Vaping Use   Vaping status: Never Used  Substance and Sexual Activity   Alcohol use: Not Currently    Comment: Quit 11 months ago. Used to drink about 6 pack a day. (documented 06/06/21)   Drug use: Not Currently    Types: Marijuana    Comment: occas   Sexual activity: Yes    Birth control/protection: None  Other Topics Concern   Not on file  Social History Narrative   Not on file   Social Drivers of Health   Tobacco Use: High Risk (10/02/2024)   Patient History    Smoking Tobacco Use: Every Day    Smokeless Tobacco Use: Never    Passive Exposure: Current  Financial Resource Strain: Not on file  Food Insecurity: No Food Insecurity (10/02/2024)   Epic    Worried About Programme Researcher, Broadcasting/film/video in the Last Year: Never true    Ran Out of Food in the Last Year: Never true  Transportation Needs: No Transportation Needs (10/02/2024)   Epic    Lack of Transportation (Medical): No    Lack of Transportation (Non-Medical): No  Physical Activity: Not on file  Stress: Not on file  Social Connections: Not on file  Depression (PHQ2-9): Low Risk (07/21/2024)   Depression (PHQ2-9)    PHQ-2 Score: 0  Alcohol Screen: Not on file  Housing: Low Risk (10/02/2024)   Epic    Unable to Pay for Housing in the Last Year: No    Number of Times Moved in the Last  Year: 0    Homeless in the Last Year: No  Utilities: Not At Risk (10/02/2024)   Epic    Threatened with loss of utilities: No  Health Literacy: Not on file   Family History  Problem Relation Age of Onset   Heart failure Mother  Stroke Father    Stroke Sister    Cancer Sister        breast cancer   Colon cancer Neg Hx    Liver cancer Neg Hx    Scheduled Meds:  [MAR Hold] ALPRAZolam   0.25 mg Oral BID   [MAR Hold] citalopram   20 mg Oral Daily   [MAR Hold]  HYDROmorphone  (DILAUDID ) injection  0.5 mg Intravenous Once   [MAR Hold] insulin  aspart  0-6 Units Subcutaneous TID WC   [MAR Hold] mirtazapine   7.5 mg Oral QHS   [MAR Hold] nicotine   21 mg Transdermal Daily   [MAR Hold] QUEtiapine   100 mg Oral QHS   Continuous Infusions:  lactated ringers  10 mL/hr at 10/06/24 1430   [MAR Hold] piperacillin -tazobactam (ZOSYN )  IV 3.375 g (10/06/24 0505)   [MAR Hold] vancomycin  1,000 mg (10/06/24 0905)   PRN Meds:.[MAR Hold] albuterol , [MAR Hold] cyclobenzaprine , [MAR Hold] gabapentin , [MAR Hold]  HYDROmorphone  (DILAUDID ) injection, [MAR Hold] ondansetron  (ZOFRAN ) IV, [MAR Hold] oxyCODONE -acetaminophen  **AND** [MAR Hold] oxyCODONE  Allergies[1] Review of Systems  Constitutional:  Positive for activity change. Negative for appetite change.  Gastrointestinal:  Positive for abdominal pain.  Psychiatric/Behavioral:  The patient is nervous/anxious.     Physical Exam Vitals and nursing note reviewed.  Constitutional:      General: He is not in acute distress.    Appearance: He is ill-appearing.  Cardiovascular:     Rate and Rhythm: Normal rate.  Pulmonary:     Effort: No tachypnea, accessory muscle usage or respiratory distress.  Abdominal:     General: There is distension.  Neurological:     Mental Status: He is alert and oriented to person, place, and time.     Vital Signs: BP 135/65   Pulse 75   Temp 97.8 F (36.6 C) (Oral)   Resp 20   Ht 5' 9 (1.753 m)   Wt 72.5 kg   SpO2  97%   BMI 23.60 kg/m  Pain Scale: 0-10   Pain Score: 9    SpO2: SpO2: 97 % O2 Device:SpO2: 97 % O2 Flow Rate: .   IO: Intake/output summary:  Intake/Output Summary (Last 24 hours) at 10/06/2024 1510 Last data filed at 10/06/2024 9374 Gross per 24 hour  Intake 1300.48 ml  Output --  Net 1300.48 ml    LBM: Last BM Date : 10/02/24 Baseline Weight: Weight: 72.5 kg Most recent weight: Weight: 72.5 kg       Time Total: 75 min  Greater than 50%  of this time was spent counseling and coordinating care related to the above assessment and plan.  Signed by: Bernarda Kitty, NP Palliative Medicine Team Pager # [phone] (M-F 8a-5p) Team Phone # [phone] (Nights/Weekends)                     [1]  Allergies Allergen Reactions   Trazodone Other (See Comments)    Caused him to sleep walk   Benadryl  [Diphenhydramine ] Itching and Rash   Ultram  [Tramadol ] Hives   "

## 2024-10-06 NOTE — Anesthesia Preprocedure Evaluation (Addendum)
"                                    Anesthesia Evaluation  Patient identified by MRN, date of birth, ID band Patient awake    Reviewed: Allergy & Precautions, NPO status , Patient's Chart, lab work & pertinent test results  Airway Mallampati: II  TM Distance: >3 FB Neck ROM: Full    Dental  (+) Edentulous Lower, Edentulous Upper   Pulmonary COPD,  COPD inhaler, Current Smoker and Patient abstained from smoking.   Pulmonary exam normal        Cardiovascular hypertension, + Peripheral Vascular Disease  Normal cardiovascular exam     Neuro/Psych  Headaches PSYCHIATRIC DISORDERS Anxiety Depression    CVA    GI/Hepatic negative GI ROS,,,(+) Cirrhosis     substance abuse  , Hepatitis -Hepatocellular carcinoma   Endo/Other  diabetes, Oral Hypoglycemic Agents    Renal/GU Renal disease     Musculoskeletal Cane and walker for mobility assistance    Abdominal   Peds  Hematology  (+) Blood dyscrasia, anemia Thrombocytopenia   Anesthesia Other Findings Osteomyelitis right first and second ray  Reproductive/Obstetrics                              Anesthesia Physical Anesthesia Plan  ASA: 4  Anesthesia Plan: MAC   Post-op Pain Management:    Induction:   PONV Risk Score and Plan: 0 and Ondansetron , Dexamethasone , Propofol  infusion, Midazolam  and Treatment may vary due to age or medical condition  Airway Management Planned: Simple Face Mask  Additional Equipment:   Intra-op Plan:   Post-operative Plan:   Informed Consent: I have reviewed the patients History and Physical, chart, labs and discussed the procedure including the risks, benefits and alternatives for the proposed anesthesia with the patient or authorized representative who has indicated his/her understanding and acceptance.   Patient has DNR.  Discussed DNR with patient and Suspend DNR.   Dental advisory given  Plan Discussed with: CRNA and  Surgeon  Anesthesia Plan Comments:          Anesthesia Quick Evaluation  "

## 2024-10-06 NOTE — Op Note (Signed)
 Full Operative Report  Date of Operation: 4:16 PM, 10/06/2024   Patient: Danny [name] - 59 y.o. male  Surgeon: Malvin Marsa FALCON, DPM   Assistant: None  Diagnosis: Osteomyelitis right first and second ray  Procedure:  1.  First metatarsal head and sesamoid resection, right foot 2.  Partial second ray amputation, right foot    Anesthesia: Monitor Anesthesia Care  No responsible provider has been recorded for the case.  Anesthesiologist: Patrisha Bernardino SQUIBB, MD CRNA: Christopher Comings, CRNA   Estimated Blood Loss: Minimal   Hemostasis: 1) Anatomical dissection, mechanical compression, electrocautery 2) no tourniquet was used during procedure  Implants: Implant Name Type Inv. Item Serial No. Manufacturer Lot No. LRB No. Used Action  COLLAGEN CELLERATERX 1 GRAM - [id number] Miscellaneous COLLAGEN CELLERATERX 1 GRAM  [employer] [id number] 1 Implanted    Materials: Prolene 2-0 and skin staples  Injectables: 1) Pre-operatively: 20 cc of 50:50 mixture 1%lidocaine  plain and 0.5% marcaine  plain 2) Post-operatively: None   Specimens: - Pathology: Right foot first metatarsal head and second ray - Microbiology: Bone culture from second metatarsal head   Antibiotics: IV antibiotics given per schedule on the floor  Drains: None  Complications: Patient tolerated the procedure well without complication.   Operative findings: As below in detailed report  Indications for Procedure: MONTEY EBEL presents to Malvin Marsa FALCON, DPM with a chief complaint of erythema and edema of the prior right great toe amputation site and the distal aspect of the second ray on the right foot concern for underlying osteomyelitis on MRI.  The patient has failed conservative treatments of various modalities. At this time the patient has elected to proceed with surgical correction. All alternatives, risks, and complications of the procedures were thoroughly explained to the patient.  Patient exhibits appropriate understanding of all discussion points and informed consent was signed and obtained in the chart with no guarantees to surgical outcome given or implied.  Description of Procedure: Patient was brought to the operating room. Patient remained on their hospital bed in the supine position. A surgical timeout was performed and all members of the operating room, the procedure, and the surgical site were identified. anesthesia occurred as per anesthesia record. Local anesthetic as previously described was then injected about the operative field in a local infiltrative block.  The operative lower extremity as noted above was then prepped and draped in the usual sterile manner. The following procedure then began.  Attention was first directed to the distal medial forefoot at site of prior right great toe amputation.  Incision was marked surrounding the second toe extending medially towards the first metatarsal head.  An ellipse of tissue was resected from the distal first metatarsal head area.  Dissection was carried down to bone and the 1st and 2nd metatarsal head was exposed.  The second toe was disarticulated at the MPJ and passed the back table to be sent with pathology.  Further dissection was carried out around the 1st and 2nd metatarsal heads.  The first met head and sesamoids was dissected. A sagittal saw was then used to make a transverse osteotomy through the neck of the first metatarsal as well as the 2nd metatarsal. The distal 1st met head and 2nd met head as well as sesamoids was resected and sent with pathology.  The bone at the resection margin appeared and felt clinically viable. A bone culture was harvested from the 2nd met head with rongeur and sent for pathology. The wound was  then irrigated with copious sterile saline. Decision was made then to apply celerate collagen powder to the wound to decrease hematoma/drainage at the surgical site. Electrocautery was then used for  hemostasis as needed. The wound was then closed in simple fashion with prolene suture and skin staples under minimal tension.   The surgical site was then dressed with Xeroform 4 x 4 Kerlix Ace wrap. The patient tolerated both the procedure and anesthesia well with vital signs stable throughout. The patient was transferred in good condition and all vital signs stable  from the OR to recovery under the discretion of anesthesia.  Condition: Vital signs stable, neurovascular status unchanged from preoperative   Surgical plan:  Expect clean surgical margin.  Okay to de-escalate antibiotics to p.o. antibiotics x 7 days from surgery.  Heel weightbearing in postop shoe.  PT OT tomorrow.  Will be cleared for discharge from my standpoint as early as tomorrow if going home.  The patient will be heel weightbearing in a postop shoe to the operative limb until further instructed. The dressing is to remain clean, dry, and intact. Will continue to follow unless noted elsewhere.   Marsa Honour, DPM [facility]

## 2024-10-07 ENCOUNTER — Other Ambulatory Visit: Payer: Self-pay

## 2024-10-07 ENCOUNTER — Encounter (HOSPITAL_COMMUNITY): Payer: Self-pay | Admitting: Podiatry

## 2024-10-07 ENCOUNTER — Other Ambulatory Visit (HOSPITAL_COMMUNITY): Payer: Self-pay

## 2024-10-07 DIAGNOSIS — R1110 Vomiting, unspecified: Secondary | ICD-10-CM | POA: Diagnosis not present

## 2024-10-07 LAB — GLUCOSE, CAPILLARY: Glucose-Capillary: 115 mg/dL — ABNORMAL HIGH (ref 70–99)

## 2024-10-07 MED ORDER — DOXYCYCLINE HYCLATE 100 MG PO TABS
100.0000 mg | ORAL_TABLET | Freq: Two times a day (BID) | ORAL | 0 refills | Status: AC
  Filled 2024-10-07: qty 20, 10d supply, fill #0

## 2024-10-07 MED ORDER — OXYCODONE-ACETAMINOPHEN 10-325 MG PO TABS
1.0000 | ORAL_TABLET | Freq: Four times a day (QID) | ORAL | 0 refills | Status: AC | PRN
  Filled 2024-10-07: qty 15, 4d supply, fill #0

## 2024-10-07 NOTE — Discharge Summary (Signed)
 Physician Discharge Summary  Danny [name] FMW:[id number] DOB: 09-20-1965 DOA: 10/02/2024  PCP: Vick Lurie, FNP  Admit date: 10/02/2024 Discharge date: 10/07/2024  Admitted From: Home Disposition: Home  Recommendations for Outpatient Follow-up:  Follow up with PCP in 1-2 weeks Please obtain BMP/CBC in one week Surgery to schedule follow-up  Home Health: N/A Equipment/Devices: Available at home, walking boot  Discharge Condition: Fair CODE STATUS: DNR/DNI Diet recommendation: Regular diet  Discharge summary: 59 year old with end-stage hepatocellular carcinoma, cirrhosis, chronic pain syndrome, history of stroke, COPD, diabetic neuropathy status post resection of right great toe and  second toe osteomyelitis on December 19 presented initially with abdominal pain nausea vomiting and diarrhea along with bloody stool.  He was also complaining of right foot pain.  He was subsequently discharged from the ER after feeling better.  Return to ER with intractable pain in his abdomen as well as in his toes.  Apparently, he ran out of the pain medications because he took extra pain pills.  Admitted with podiatry consultation.  Improved after resuming opiates.  Underwent resection right foot to this clean surgical margins.  Going home with oral antibiotics, pain medications and surgical follow-up.   Nausea vomiting and abdominal pain: No acute findings in the abdomen.  Has chronic pain syndrome, hepatocellular carcinoma, on chronic opiates. Clinically improved. Patient on chronic pain management with Percocet, gabapentin  and Flexeril . Patient reported running out of medications.  Next pain management clinic follow-up on 10/12/2024.  Patient was prescribed limited quantities of pain medications for surgical pain as well as for his chronic pain.   Right second toe cellulitis and osteomyelitis of the first metatarsal: Right great toe amputation 08/27/2024 Comes back with persistent  infection Treated with vancomycin  and Zosyn .  Underwent further surgical debridement and amputation to the clean margin. Doxycycline  for 7 days. Dressing to keep intact until podiatry follow-up. Pain medications as above.   Hypertension: Blood pressure is stable.  Not on medications.   Type 2 diabetes: Well-controlled.  He has neuropathy on gabapentin .  On metformin  at home.  Continue.   History of stroke: Currently stable.  Hepatocellular carcinoma with cirrhosis: Currently not on active treatment.  Followed by oncology at [facility].  Patient desires to enroll into home hospice program once active medical issues improved.  Referral made.  Patient is adequately stabilized to discharge home today with close outpatient follow-up.     Discharge Diagnoses:  Principal Problem:   Intractable vomiting Active Problems:   SYNDROME, CHRONIC PAIN   Gastroesophageal reflux disease   Hepatocellular carcinoma (HCC)   Hypertension   Type 2 diabetes mellitus (HCC)   Essential tremor   Stage 3a chronic kidney disease (HCC)   Pyogenic inflammation of bone (HCC)   Rectal bleeding    Discharge Instructions  Discharge Instructions     Call MD for:  severe uncontrolled pain   Complete by: As directed    Call MD for:  temperature >100.4   Complete by: As directed    Diet general   Complete by: As directed    Increase activity slowly   Complete by: As directed    Leave dressing on - Keep it clean, dry, and intact until clinic visit   Complete by: As directed    No wound care   Complete by: As directed       Allergies as of 10/07/2024       Reactions   Trazodone Other (See Comments)   Caused him to sleep walk   Benadryl  [  diphenhydramine ] Itching, Rash   Ultram  [tramadol ] Hives        Medication List     STOP taking these medications    cephALEXin  500 MG capsule Commonly known as: KEFLEX        TAKE these medications    albuterol  108 (90 Base) MCG/ACT  inhaler Commonly known as: VENTOLIN  HFA Inhale 2 puffs into the lungs every 6 (six) hours as needed for wheezing or shortness of breath.   ALPRAZolam  0.25 MG tablet Commonly known as: XANAX  Take 0.25 mg by mouth 2 (two) times daily.   citalopram  20 MG tablet Commonly known as: CELEXA  Take 20 mg by mouth daily.   cyclobenzaprine  5 MG tablet Commonly known as: FLEXERIL  Take 5 mg by mouth daily as needed for muscle spasms.   doxycycline  100 MG capsule Commonly known as: VIBRAMYCIN  Take 1 capsule (100 mg total) by mouth 2 (two) times daily for 10 days.   gabapentin  300 MG capsule Commonly known as: NEURONTIN  Take 300 mg by mouth 2 (two) times daily.   metFORMIN  1000 MG tablet Commonly known as: GLUCOPHAGE  Take 1,000 mg by mouth daily as needed (when bg gets high).   mirtazapine  7.5 MG tablet Commonly known as: REMERON  Take 1 tablet every day by oral route for 90 days.   naloxone 4 MG/0.1ML Liqd nasal spray kit Commonly known as: NARCAN Place 1 spray into the nose once.   ondansetron  4 MG disintegrating tablet Commonly known as: ZOFRAN -ODT Take 1 tablet (4 mg total) by mouth every 8 (eight) hours as needed for nausea or vomiting.   oxyCODONE -acetaminophen  10-325 MG tablet Commonly known as: Percocet Take 1 tablet by mouth every 6 (six) hours as needed for up to 20 days for pain.   prochlorperazine  10 MG tablet Commonly known as: COMPAZINE  Take 1 tablet (10 mg total) by mouth every 6 (six) hours as needed for nausea or vomiting.   QUEtiapine  100 MG tablet Commonly known as: SEROQUEL  Take 100 mg by mouth at bedtime.   Vitamin D -3 125 MCG (5000 UT) Tabs Take 5,000 Units by mouth daily.               Discharge Care Instructions  (From admission, onward)           Start     Ordered   10/07/24 0000  Leave dressing on - Keep it clean, dry, and intact until clinic visit        10/07/24 0943             Allergies[1]  Consultations: Podiatry Palliative   Procedures/Studies: DG Foot 2 Views Right Result Date: 10/06/2024 CLINICAL DATA:  Postop EXAM: DG FOOT 2V*R* COMPARISON:  10/03/2024 FINDINGS: Interval transmetatarsal amputation first and second digits with gas in the soft tissues and cutaneous staples. Smooth cut margins. Retained screw fragment in the posterior calcaneus. Plantar calcaneal spur and calcification. Vascular calcifications IMPRESSION: Interval transmetatarsal amputation of the first and second digits. Electronically Signed   By: Luke Bun M.D.   On: 10/06/2024 20:24   MR TOES RIGHT W WO CONTRAST Result Date: 10/03/2024 EXAM: MRI of the right Foot with and without contrast. 10/03/2024 04:12:00 PM TECHNIQUE: Multiplanar multisequence MRI of the right foot was performed with and without the administration of intravenous contrast. CONTRAST: 7 mL of Gadavist . COMPARISON: Radiographs 08/13/2025. CLINICAL HISTORY: Right second toe osteomyelitis. FINDINGS: LISFRANC JOINT: Visualized Lisfranc ligament is intact. No significant Lisfranc interval widening or significant periligamentous edema. BONE MARROW: Abnormal marrow edema and enhancement in  the shaft and distal head of the 1st metatarsal, in the 1st digit sesamoids, and in the head and distal shaft of the 2nd metatarsal as well as the 2nd phalanges suspicious for active osteomyelitis. GREATER AND LESSER MTP JOINTS: No significant joint effusion or osseous erosions. No significant degenerative changes. Normal alignment. SOFT TISSUES: Amputation of the great toe on 08/27/2024. Low grade subcutaneous edema along the amputation site of the great toe, potentially reflecting residual wound or local cellulitis. Mild second and third digit hammertoe deformities. TENDONS: Visualized flexor and extensor tendons are intact without tenosynovitis. IMPRESSION: 1. Marrow edema and enhancement involving the first metatarsal shaft and distal head,  first digit sesamoids, second metatarsal head and distal shaft, and the second digit phalanges, suspicious for active osteomyelitis. 2. Low-grade subcutaneous edema along the great toe amputation site, potentially reflecting residual wound or local cellulitis. 3. Status post great toe amputation. 4. Mild second and third digit hammertoe deformities. 5. Mild degenerative spurring between the middle cuneiform and the base of the second metatarsal. Electronically signed by: Ryan Salvage MD 10/03/2024 04:26 PM EST RP Workstation: [id number]   DG Toe 2nd Right Result Date: 10/03/2024 CLINICAL DATA:  [id number] Pain in right toe(s) [id number] EXAM: RIGHT SECOND TOE COMPARISON:  September 01, 2024, August 27, 2024 FINDINGS: Status post surgical amputation of the great toe. There is increased cortical indistinctness along the lateral head of the distal first metatarsal with subcortical lucency at this location. Dorsal spurring of the head of the first MTP with possible indistinctness of the cortex in this location on lateral radiograph. Vascular calcifications. Mild soft tissue edema. Degenerative changes of the midfoot. Surgical screw within the calcaneus. IMPRESSION: Increased cortical indistinctness along the lateral head of the distal first metatarsal with subcortical lucency at this location. Findings are concerning for osteomyelitis with differential considerations including sequela of repetitive trauma. Recommend correlation with physical exam. Electronically Signed   By: Corean Salter M.D.   On: 10/03/2024 11:00   CT ABDOMEN PELVIS W CONTRAST Result Date: 10/02/2024 EXAM: CT ABDOMEN AND PELVIS WITH CONTRAST 10/02/2024 01:15:38 PM TECHNIQUE: CT of the abdomen and pelvis was performed with the administration of 100 mL of iohexol  (OMNIPAQUE ) 300 MG/ML solution. Multiplanar reformatted images are provided for review. Automated exposure control, iterative reconstruction, and/or weight-based adjustment of the  mA/kV was utilized to reduce the radiation dose to as low as reasonably achievable. COMPARISON: CT 06/15/2024. CLINICAL HISTORY: Abdominal pain, acute, nonlocalized. 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 FINDINGS: LOWER CHEST: No pleural or pericardial effusion. Scattered coronary calcifications. LIVER: Peripherally enhancing 5.6 cm lesion segment 4a, previously 3.2 cm. Mildly nodular hepatic contour. Heterogenous enhancement in the relatively atrophic lateral left hepatic segment as before. GALLBLADDER AND BILE DUCTS: Gallbladder is unremarkable. No biliary ductal dilatation. SPLEEN: Splenomegaly. Large splenorenal shunt. PANCREAS: No acute abnormality. ADRENAL GLANDS: No acute abnormality. KIDNEYS, URETERS AND BLADDER: Scattered bilateral urolithiasis, largest 4 mm peripherally in the right lower pole. 2.1 cm exophytic cortical cyst from the lower pole left kidney, stable. No hydronephrosis. No perinephric or periureteral stranding. Urinary bladder is unremarkable. GI AND BOWEL: Stomach demonstrates no acute abnormality. There is no bowel obstruction. PERITONEUM AND RETROPERITONEUM: No ascites. No free air. VASCULATURE: Aorta is normal in caliber. Moderate partially calcified aortoiliac plaque without aneurysm or high grade stenosis. LYMPH NODES: 1 cm enlarged retrocaval lymph node (2:40), new since 06/15/2024. REPRODUCTIVE ORGANS: Mild prostate enlargement. BONES AND SOFT TISSUES: Minimal spondylotic changes in the lumbar spine. Bilateral hip DJD. No  acute osseous abnormality. No focal soft tissue abnormality. IMPRESSION: 1. Scattered bilateral urolithiasis, largest 4 mm peripherally in the right lower pole. 2. Enlarging 5.6 cm peripherally enhancing lesion in segment 4A (previously 3.2 cm), compatible with progressive hepatocellular carcinoma. 3. New 1 cm enlarged retrocaval lymph node, concerning for nodal metastatic disease. Electronically signed by: Dayne Hassell MD  10/02/2024 02:00 PM EST RP Workstation: [id number]   DG Chest Port 1 View Result Date: 10/02/2024 EXAM: 1 VIEW(S) XRAY OF THE CHEST 10/02/2024 12:06:00 PM COMPARISON: Chest radiograph 01/01/2018 CT chest 06/15/2024. CLINICAL HISTORY: Weakness. FINDINGS: LUNGS AND PLEURA: Mildly increased interstitial markings which could reflect COPD. No lobar consolidation. No pleural effusion. No pneumothorax. HEART AND MEDIASTINUM: No acute abnormality of the cardiac and mediastinal silhouettes. BONES AND SOFT TISSUES: Degenerative changes noted in the thoracic spine. IMPRESSION: 1. No acute process. Electronically signed by: Donnice Mania MD 10/02/2024 12:52 PM EST RP Workstation: [id number]   (Echo, Carotid, EGD, Colonoscopy, ERCP)    Subjective: Patient seen and examined.  Pain is controlled.  Able to speak.  He does have chronic abdominal pain but able to control with Percocet.  Patient tells me that his regular pharmacy did not give him pain medications because he was not due yet.  He used additional dose of pain medicine due to his foot pain for he ran out of the pain medications. He was prescribed limited doses of Percocet and advised to follow-up with pain management clinic.  Patient was aware about keeping naloxone handy with him in case of overdose.   Discharge Exam: Vitals:   10/07/24 0322 10/07/24 0800  BP: (!) 117/58 (!) 107/52  Pulse: 73 73  Resp: 17 15  Temp: 98.1 F (36.7 C) 98.3 F (36.8 C)  SpO2: 96% 96%   Vitals:   10/06/24 2024 10/06/24 2305 10/07/24 0322 10/07/24 0800  BP: (!) 141/65 (!) 117/47 (!) 117/58 (!) 107/52  Pulse: 76 72 73 73  Resp: 18 17 17 15   Temp: 97.9 F (36.6 C) 98.1 F (36.7 C) 98.1 F (36.7 C) 98.3 F (36.8 C)  TempSrc:      SpO2: 97% 97% 96% 96%  Weight:      Height:        General: Pt is alert, awake, not in acute distress.  Thinly built. Cardiovascular: RRR, S1/S2 +, no rubs, no gallops Respiratory: CTA bilaterally, no wheezing, no  rhonchi Abdominal: Soft, NT, ND, bowel sounds + Extremities: no edema, no cyanosis Right foot postoperative dressing with compression bandage intact.  Not opened by me.    The results of significant diagnostics from this hospitalization (including imaging, microbiology, ancillary and laboratory) are listed below for reference.     Microbiology: Recent Results (from the past 240 hours)  Aerobic/Anaerobic Culture w Gram Stain (surgical/deep wound)     Status: None (Preliminary result)   Collection Time: 10/06/24  4:00 PM   Specimen: Soft Tissue, Other  Result Value Ref Range Status   Specimen Description TISSUE  Final   Special Requests SECOND RAY  Final   Gram Stain   Final    RARE WBC SEEN NO ORGANISMS SEEN Performed at [facility], [address], [place], [place] [address]    Culture PENDING  Incomplete   Report Status PENDING  Incomplete     Labs: BNP (last 3 results) No results for input(s): BNP in the last 8760 hours. Basic Metabolic Panel: Recent Labs  Lab 10/02/24 1141 10/03/24 0442  NA 140 143  K  4.1 3.7  CL 103 107  CO2 21* 26  GLUCOSE 190* 105*  BUN 6 7  CREATININE 0.84 0.84  CALCIUM  9.9 9.2   Liver Function Tests: Recent Labs  Lab 10/02/24 1141  AST 34  ALT 22  ALKPHOS 131*  BILITOT 1.1  PROT 7.6  ALBUMIN  4.1   Recent Labs  Lab 10/02/24 1205  LIPASE 59*   No results for input(s): AMMONIA in the last 168 hours. CBC: Recent Labs  Lab 10/02/24 1141 10/03/24 0442 10/05/24 1027 10/06/24 0230  WBC 4.1 3.8* 3.9* 4.5  HGB 13.6 11.1* 12.2* 11.1*  HCT 40.0 32.9* 35.3* 32.1*  MCV 91.1 90.1 90.3 89.9  PLT 109* 96* 87* 92*   Cardiac Enzymes: No results for input(s): CKTOTAL, CKMB, CKMBINDEX, TROPONINI in the last 168 hours. BNP: Invalid input(s): POCBNP CBG: Recent Labs  Lab 10/06/24 1428 10/06/24 1611 10/06/24 1730 10/06/24 2131 10/07/24 0801  GLUCAP 139* 118* 193* 156* 115*   D-Dimer No results for input(s):  DDIMER in the last 72 hours. Hgb A1c Recent Labs    10/05/24 1011  HGBA1C 5.2   Lipid Profile No results for input(s): CHOL, HDL, LDLCALC, TRIG, CHOLHDL, LDLDIRECT in the last 72 hours. Thyroid  function studies No results for input(s): TSH, T4TOTAL, T3FREE, THYROIDAB in the last 72 hours.  Invalid input(s): FREET3 Anemia work up No results for input(s): VITAMINB12, FOLATE, FERRITIN, TIBC, IRON, RETICCTPCT in the last 72 hours. Urinalysis    Component Value Date/Time   COLORURINE YELLOW 10/02/2024 1443   APPEARANCEUR CLEAR 10/02/2024 1443   LABSPEC 1.029 10/02/2024 1443   PHURINE 7.0 10/02/2024 1443   GLUCOSEU NEGATIVE 10/02/2024 1443   HGBUR NEGATIVE 10/02/2024 1443   BILIRUBINUR NEGATIVE 10/02/2024 1443   KETONESUR NEGATIVE 10/02/2024 1443   PROTEINUR NEGATIVE 10/02/2024 1443   UROBILINOGEN 0.2 04/03/2015 1600   NITRITE NEGATIVE 10/02/2024 1443   LEUKOCYTESUR NEGATIVE 10/02/2024 1443   Sepsis Labs Recent Labs  Lab 10/02/24 1141 10/03/24 0442 10/05/24 1027 10/06/24 0230  WBC 4.1 3.8* 3.9* 4.5   Microbiology Recent Results (from the past 240 hours)  Aerobic/Anaerobic Culture w Gram Stain (surgical/deep wound)     Status: None (Preliminary result)   Collection Time: 10/06/24  4:00 PM   Specimen: Soft Tissue, Other  Result Value Ref Range Status   Specimen Description TISSUE  Final   Special Requests SECOND RAY  Final   Gram Stain   Final    RARE WBC SEEN NO ORGANISMS SEEN Performed at [facility], [address], [place], [place] [address]    Culture PENDING  Incomplete   Report Status PENDING  Incomplete     Time coordinating discharge: 35 minutes  SIGNED:   Renato Applebaum, MD  [facility] 10/07/2024, 9:43 AM     [1]  Allergies Allergen Reactions   Trazodone Other (See Comments)    Caused him to sleep walk   Benadryl  [Diphenhydramine ] Itching and Rash   Ultram  [Tramadol ] Hives

## 2024-10-07 NOTE — Anesthesia Postprocedure Evaluation (Signed)
"   Anesthesia Post Note  Patient: Danny [name]  Procedure(s) Performed: EXCISION, METATARSAL BONE, HEAD, PARTIAL SECOND RAY AMPUTATION (Right: Toe)     Patient location during evaluation: PACU Anesthesia Type: MAC Level of consciousness: awake Pain management: pain level controlled Vital Signs Assessment: post-procedure vital signs reviewed and stable Respiratory status: spontaneous breathing, nonlabored ventilation and respiratory function stable Cardiovascular status: blood pressure returned to baseline and stable Postop Assessment: no apparent nausea or vomiting Anesthetic complications: no   There were no known notable events for this encounter.  Last Vitals:  Vitals:   10/06/24 2305 10/07/24 0322  BP: (!) 117/47 (!) 117/58  Pulse: 72 73  Resp: 17 17  Temp: 36.7 C 36.7 C  SpO2: 97% 96%    Last Pain:  Vitals:   10/07/24 0612  TempSrc:   PainSc: 2                  Nilani Hugill P Keldric Poyer      "

## 2024-10-07 NOTE — Progress Notes (Signed)
 Physical Therapy Treatment Patient Details Name: Danny [name] MRN: [id number] DOB: January 05, 1966 Today's Date: 10/07/2024   History of Present Illness 59 yo male who presented 1/24 complaining of abdominal pain, nausea vomiting and diarrhea, bloody stool and R foot pain; pt declined admit and discharged. He returned later that day due to continued pain. MRI demonstrated  osteomyelitis in the first metatarsal head and second toe. 1/28 first metatarsal head and sesamoid resection and partial second ray amputation on R foot. PMH: R great toe amp 12/19, HTN, GERD, diabetes, CVA, CKD 3A, PAD, hepatitis C, cirrhosis, HCC, neuropathy, anxiety, and COPD.   PT Comments  Pt received in supine and agreeable to PT session. Pt was unable to verbalize WB precautions with cueing needed throughout for adherence. Able to don/doff post-op shoe independently. Pt improved with use of RW when ambulating with supervision for safety. With increased distance, pt had difficulty adhering to WB precautions. Discussed ambulating short distances and taking seated rest breaks when fatigued. Pt was able to negotiate 4 steps with BUE support utilizing sideways step to method. Educated on leading with LLE and descending with RLE. Also discussed proper guarding and always having assist with negotiating steps. Pt feels comfortable d/c home when no further questions/concerns. Will continue to follow acutely.     If plan is discharge home, recommend the following: Help with stairs or ramp for entrance;Assistance with cooking/housework   Can travel by private vehicle      Yes  Equipment Recommendations  None recommended by PT       Precautions / Restrictions Precautions Precautions: Fall Recall of Precautions/Restrictions: Intact Required Braces or Orthoses: Other Brace Other Brace: R post-op shoe Restrictions Weight Bearing Restrictions Per Provider Order: Yes RLE Weight Bearing Per Provider Order: Weight bearing as tolerated  (WBAT through heel) Other Position/Activity Restrictions: in post-op shoe     Mobility  Bed Mobility Overal bed mobility: Independent     Transfers Overall transfer level: Needs assistance Equipment used: Rolling walker (2 wheels) Transfers: Sit to/from Stand Sit to Stand: Supervision    General transfer comment: cues to keep R LE anterior to self and to push through heel. Cues for hand placement    Ambulation/Gait Ambulation/Gait assistance: Supervision Gait Distance (Feet): 50 Feet Assistive device: Rolling walker (2 wheels) Gait Pattern/deviations: Antalgic, Decreased stance time - right, Step-to pattern, Decreased step length - right Gait velocity: decr    General Gait Details: Step to pattern with R LE leading. Had increased difficulty maintaining WB precautions with increased distance 2/2 fatigue   Stairs Stairs: Yes Stairs assistance: Contact guard assist Stair Management: Sideways, Step to pattern, One rail Right Number of Stairs: 4 General stair comments: sideways step to pattern with LLE leading and RLE to descend. BUE on handrail with CGA for safety. Cues for R heel WB throughout    Balance Overall balance assessment: Mild deficits observed, not formally tested       Communication Communication Communication: No apparent difficulties  Cognition Arousal: Alert Behavior During Therapy: WFL for tasks assessed/performed   PT - Cognitive impairments: No apparent impairments    Following commands: Intact      Cueing Cueing Techniques: Verbal cues, Visual cues         Pertinent Vitals/Pain Pain Assessment Pain Assessment: Faces Faces Pain Scale: Hurts even more Pain Location: surgical incision on R foot Pain Descriptors / Indicators: Discomfort, Guarding Pain Intervention(s): Limited activity within patient's tolerance, Monitored during session, Repositioned     PT Goals (current  goals can now be found in the care plan section) Acute Rehab PT  Goals PT Goal Formulation: With patient Time For Goal Achievement: 10/20/24 Potential to Achieve Goals: Good Progress towards PT goals: Progressing toward goals    Frequency    Min 2X/week       AM-PAC PT 6 Clicks Mobility   Outcome Measure  Help needed turning from your back to your side while in a flat bed without using bedrails?: None Help needed moving from lying on your back to sitting on the side of a flat bed without using bedrails?: None Help needed moving to and from a bed to a chair (including a wheelchair)?: A Little Help needed standing up from a chair using your arms (e.g., wheelchair or bedside chair)?: A Little Help needed to walk in hospital room?: A Little Help needed climbing 3-5 steps with a railing? : A Little 6 Click Score: 20    End of Session Equipment Utilized During Treatment: Gait belt Activity Tolerance: Patient tolerated treatment well Patient left: in bed;with call bell/phone within reach Nurse Communication: Mobility status PT Visit Diagnosis: Unsteadiness on feet (R26.81);Pain Pain - Right/Left: Right Pain - part of body: Ankle and joints of foot     Time: 1012-1036 PT Time Calculation (min) (ACUTE ONLY): 24 min  Charges:    $Gait Training: 8-22 mins $Therapeutic Activity: 8-22 mins PT General Charges $$ ACUTE PT VISIT: 1 Visit                    Danny [name], PT, DPT Secure Chat Preferred  Rehab Office [phone]    Danny [name] 10/07/2024, 12:32 PM

## 2024-10-07 NOTE — TOC Progression Note (Signed)
 Transition of Care [facility]) - Progression Note    Patient Details  Name: Danny [name] MRN: [id number] Date of Birth: May 15, 1966  Transition of Care [facility] ) CM/SW Contact  Rosaline JONELLE Joe, RN Phone Number: 10/07/2024, 10:03 AM  Clinical Narrative:    CM spoke with the MD and patient will discharge home with family today.  Patient had surgery by podiatry for amputation and patient has Post op shoe in the hospital room.  Patient has cancer and spoke with Palliative Care Team yesterday and may be interest in starting home hospital at a later date after he discusses with the niece that he lives with.  Home hospice choice was offered to the patient and patient states that he would like [facility].  Patient was agreeable that I place referral with [facility] and that he would follow up when he goes home.  I called [facility] and placed the referral.  Patient has old broken cane in the room, RW and Commode at the home.  The patient smokes cigarettes and marijuana at the home but denies ETOH use.  MD plans to send discharge medications to [facility] pharmacy.  Patient's niece will likely provide transportation to home today by car.                     Expected Discharge Plan and Services         Expected Discharge Date: 10/07/24                                     Social Drivers of Health (SDOH) Interventions SDOH Screenings   Food Insecurity: No Food Insecurity (10/02/2024)  Housing: Low Risk (10/02/2024)  Transportation Needs: No Transportation Needs (10/02/2024)  Utilities: Not At Risk (10/02/2024)  Depression (PHQ2-9): Low Risk (07/21/2024)  Tobacco Use: High Risk (10/02/2024)    Readmission Risk Interventions    10/05/2024    1:24 PM 10/04/2024    8:22 AM  Readmission Risk Prevention Plan  Transportation Screening Complete Complete  PCP or Specialist Appt within 5-7 Days Complete Complete  Home Care Screening Complete Complete   Medication Review (RN CM) Complete Complete

## 2024-10-08 ENCOUNTER — Telehealth: Payer: Self-pay | Admitting: Podiatry

## 2024-10-08 NOTE — Telephone Encounter (Signed)
 Left voicemail for patient to schedule surgery follow up appointment for next week.

## 2024-10-08 NOTE — Telephone Encounter (Signed)
 Left a vm for the patient, Danny [name], to call us  back and set up a follow-up appt with Dr. Malvin

## 2024-10-10 ENCOUNTER — Other Ambulatory Visit: Payer: Self-pay

## 2024-10-11 LAB — AEROBIC/ANAEROBIC CULTURE W GRAM STAIN (SURGICAL/DEEP WOUND): Culture: NO GROWTH

## 2024-10-12 LAB — SURGICAL PATHOLOGY

## 2024-10-14 ENCOUNTER — Ambulatory Visit: Admitting: Podiatry

## 2024-10-14 ENCOUNTER — Encounter: Payer: Self-pay | Admitting: Podiatry

## 2024-10-14 DIAGNOSIS — M86171 Other acute osteomyelitis, right ankle and foot: Secondary | ICD-10-CM

## 2024-10-14 DIAGNOSIS — Z9889 Other specified postprocedural states: Secondary | ICD-10-CM

## 2024-10-14 NOTE — Progress Notes (Signed)
"  °  Subjective:  Patient ID: Danny [name], male    DOB: 07/14/1966,  MRN: [id number]  Chief Complaint  Patient presents with   Routine Post Op     Right EXCISION, METATARSAL BONE, HEAD.2nd partial ray amputation. 4 pain. Wearing surgical shoe. Taking antibiotics.    DOS: 10/06/2024 Procedure: 1.  First metatarsal head and sesamoid resection, right foot 2.  Partial second ray amputation, right foot  59 y.o. male seen for post op check.  Patient reports he is doing well he denies pain wearing surgical shoe taking antibiotics as instructed.  He has left dressing clean dry and intact until today's instructed.  Review of Systems: Negative except as noted in the HPI. Denies N/V/F/Ch.   Objective:   Constitutional Well developed. Well nourished.  Vascular Foot warm and well perfused. Capillary refill normal to all digits.   No calf pain with palpation  Neurologic Normal speech. Oriented to person, place, and time. Epicritic sensation diminished to surgical site  Dermatologic Amputation site well coapted no evidence of drainage erythema or maceration.  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 FOOT, SECOND RAY AND FIRST METATARSAL, AMPUTATION:  - Distal soft tissue with chronic ulceration and underlying chronic  inactive osteomyelitis.  - Separate fragments of bone with cut surfaces of bone at presumed  proximal margin; negative for acute osteomyelitis.   Micro: E faecalis sensitive  Assessment:   1. Other acute osteomyelitis of right foot (HCC)   2. Post-operative state     Plan:  Patient was evaluated and treated and all questions answered.  1 week s/p partial 1st and 2nd ray amputation on the right foot -Progressing well postoperatively amputation site is well coapted dry no evidence of maceration or dehiscence healing appropriately -XR: Expected postoperative changes -WB Status:  Weightbearing as tolerated in postop shoe -Sutures: Remain intact another week. -Medications/ABX: Finish course of p.o. antibiotics and then monitor off -Dressing: Replace with new Xeroform 4 x 4 Kerlix Ace wrap dressing leave intact with next appointment - F/u Plan: Follow-up 1 week        Marolyn JULIANNA Honour, DPM [facility] "

## 2024-10-21 ENCOUNTER — Encounter: Admitting: Podiatry
